# Patient Record
Sex: Female | Born: 1935 | Race: White | Hispanic: No | Smoking: Former smoker
Health system: Southern US, Community
[De-identification: ages and names within clinical notes are randomized; demographics above are authoritative.]

## PROBLEM LIST (undated history)

## (undated) DIAGNOSIS — R32 Unspecified urinary incontinence: Secondary | ICD-10-CM

## (undated) DIAGNOSIS — M810 Age-related osteoporosis without current pathological fracture: Secondary | ICD-10-CM

## (undated) DIAGNOSIS — Z9289 Personal history of other medical treatment: Secondary | ICD-10-CM

## (undated) DIAGNOSIS — K219 Gastro-esophageal reflux disease without esophagitis: Secondary | ICD-10-CM

## (undated) DIAGNOSIS — I471 Supraventricular tachycardia, unspecified: Secondary | ICD-10-CM

## (undated) DIAGNOSIS — J45909 Unspecified asthma, uncomplicated: Secondary | ICD-10-CM

## (undated) DIAGNOSIS — H9193 Unspecified hearing loss, bilateral: Secondary | ICD-10-CM

## (undated) DIAGNOSIS — R55 Syncope and collapse: Secondary | ICD-10-CM

## (undated) DIAGNOSIS — M1990 Unspecified osteoarthritis, unspecified site: Secondary | ICD-10-CM

## (undated) DIAGNOSIS — J449 Chronic obstructive pulmonary disease, unspecified: Secondary | ICD-10-CM

## (undated) DIAGNOSIS — G4730 Sleep apnea, unspecified: Secondary | ICD-10-CM

## (undated) DIAGNOSIS — F419 Anxiety disorder, unspecified: Secondary | ICD-10-CM

## (undated) DIAGNOSIS — R002 Palpitations: Secondary | ICD-10-CM

## (undated) DIAGNOSIS — E785 Hyperlipidemia, unspecified: Secondary | ICD-10-CM

## (undated) DIAGNOSIS — I5032 Chronic diastolic (congestive) heart failure: Secondary | ICD-10-CM

## (undated) DIAGNOSIS — I509 Heart failure, unspecified: Secondary | ICD-10-CM

## (undated) DIAGNOSIS — Z1211 Encounter for screening for malignant neoplasm of colon: Secondary | ICD-10-CM

## (undated) DIAGNOSIS — E119 Type 2 diabetes mellitus without complications: Secondary | ICD-10-CM

## (undated) DIAGNOSIS — I480 Paroxysmal atrial fibrillation: Secondary | ICD-10-CM

## (undated) DIAGNOSIS — Z86010 Personal history of colonic polyps: Secondary | ICD-10-CM

## (undated) DIAGNOSIS — N183 Chronic kidney disease, stage 3 unspecified: Secondary | ICD-10-CM

## (undated) HISTORY — DX: Gastro-esophageal reflux disease without esophagitis: K21.9

## (undated) HISTORY — DX: Sleep apnea, unspecified: G47.30

## (undated) HISTORY — DX: Chronic kidney disease, stage 3 unspecified: N18.30

## (undated) HISTORY — PX: COLECTOMY: SHX59

## (undated) HISTORY — DX: Hyperlipidemia, unspecified: E78.5

## (undated) HISTORY — DX: Syncope and collapse: R55

## (undated) HISTORY — DX: Unspecified urinary incontinence: R32

## (undated) HISTORY — DX: Personal history of other medical treatment: Z92.89

## (undated) HISTORY — DX: Palpitations: R00.2

## (undated) HISTORY — DX: Supraventricular tachycardia: I47.1

## (undated) HISTORY — DX: Unspecified asthma, uncomplicated: J45.909

## (undated) HISTORY — DX: Anxiety disorder, unspecified: F41.9

## (undated) HISTORY — DX: Chronic diastolic (congestive) heart failure: I50.32

## (undated) HISTORY — DX: Supraventricular tachycardia, unspecified: I47.10

## (undated) HISTORY — DX: Unspecified hearing loss, bilateral: H91.93

## (undated) HISTORY — DX: Personal history of colonic polyps: Z86.010

## (undated) HISTORY — DX: Chronic obstructive pulmonary disease, unspecified: J44.9

## (undated) HISTORY — DX: Paroxysmal atrial fibrillation: I48.0

## (undated) HISTORY — PX: TONSILLECTOMY: SUR1361

## (undated) HISTORY — DX: Type 2 diabetes mellitus without complications: E11.9

## (undated) HISTORY — DX: Age-related osteoporosis without current pathological fracture: M81.0

## (undated) HISTORY — DX: Encounter for screening for malignant neoplasm of colon: Z12.11

## (undated) HISTORY — DX: Heart failure, unspecified: I50.9

---

## 1977-01-21 HISTORY — PX: TOTAL ABDOMINAL HYSTERECTOMY: SHX209

## 1982-01-21 HISTORY — PX: CHOLECYSTECTOMY: SHX55

## 1996-01-22 HISTORY — PX: REPLACEMENT TOTAL KNEE BILATERAL: SUR1225

## 2003-08-11 ENCOUNTER — Other Ambulatory Visit: Payer: Self-pay

## 2003-08-12 ENCOUNTER — Other Ambulatory Visit: Payer: Self-pay

## 2004-01-21 ENCOUNTER — Other Ambulatory Visit: Payer: Self-pay

## 2004-01-22 ENCOUNTER — Inpatient Hospital Stay: Payer: Self-pay | Admitting: Family Medicine

## 2004-06-27 ENCOUNTER — Ambulatory Visit: Payer: Self-pay | Admitting: Pain Medicine

## 2004-07-09 ENCOUNTER — Ambulatory Visit: Payer: Self-pay | Admitting: Pain Medicine

## 2004-07-17 ENCOUNTER — Ambulatory Visit: Payer: Self-pay

## 2004-07-30 ENCOUNTER — Ambulatory Visit: Payer: Self-pay | Admitting: Pain Medicine

## 2004-08-02 ENCOUNTER — Ambulatory Visit: Payer: Self-pay | Admitting: Internal Medicine

## 2004-08-09 ENCOUNTER — Ambulatory Visit: Payer: Self-pay | Admitting: Pain Medicine

## 2004-08-21 ENCOUNTER — Ambulatory Visit: Payer: Self-pay | Admitting: Internal Medicine

## 2004-08-29 ENCOUNTER — Ambulatory Visit: Payer: Self-pay | Admitting: Physician Assistant

## 2004-09-17 ENCOUNTER — Ambulatory Visit: Payer: Self-pay | Admitting: Pain Medicine

## 2004-09-18 ENCOUNTER — Ambulatory Visit: Payer: Self-pay | Admitting: Pain Medicine

## 2004-09-21 ENCOUNTER — Ambulatory Visit: Payer: Self-pay | Admitting: Internal Medicine

## 2004-10-02 ENCOUNTER — Ambulatory Visit: Payer: Self-pay | Admitting: Pain Medicine

## 2004-10-16 ENCOUNTER — Ambulatory Visit: Payer: Self-pay | Admitting: Physician Assistant

## 2004-10-21 ENCOUNTER — Ambulatory Visit: Payer: Self-pay | Admitting: Internal Medicine

## 2004-11-05 ENCOUNTER — Ambulatory Visit: Payer: Self-pay | Admitting: Family Medicine

## 2004-12-12 ENCOUNTER — Emergency Department: Payer: Self-pay | Admitting: Emergency Medicine

## 2004-12-12 ENCOUNTER — Other Ambulatory Visit: Payer: Self-pay

## 2005-01-24 ENCOUNTER — Emergency Department: Payer: Self-pay | Admitting: Emergency Medicine

## 2005-09-19 ENCOUNTER — Emergency Department: Payer: Self-pay | Admitting: Emergency Medicine

## 2005-11-07 ENCOUNTER — Ambulatory Visit: Payer: Self-pay | Admitting: Family Medicine

## 2006-01-01 ENCOUNTER — Ambulatory Visit: Payer: Self-pay | Admitting: General Practice

## 2006-01-20 ENCOUNTER — Ambulatory Visit: Payer: Self-pay | Admitting: Psychiatry

## 2006-01-27 ENCOUNTER — Emergency Department: Payer: Self-pay | Admitting: Emergency Medicine

## 2006-05-30 ENCOUNTER — Ambulatory Visit: Payer: Self-pay | Admitting: General Practice

## 2006-05-30 ENCOUNTER — Other Ambulatory Visit: Payer: Self-pay

## 2006-06-13 ENCOUNTER — Ambulatory Visit: Payer: Self-pay | Admitting: General Practice

## 2006-12-24 ENCOUNTER — Ambulatory Visit: Payer: Self-pay | Admitting: Family Medicine

## 2007-01-22 HISTORY — PX: SHOULDER SURGERY: SHX246

## 2007-04-07 ENCOUNTER — Ambulatory Visit: Payer: Self-pay | Admitting: Gastroenterology

## 2007-05-08 ENCOUNTER — Ambulatory Visit: Payer: Self-pay | Admitting: Surgery

## 2007-05-13 ENCOUNTER — Inpatient Hospital Stay: Payer: Self-pay | Admitting: Surgery

## 2007-05-14 ENCOUNTER — Other Ambulatory Visit: Payer: Self-pay

## 2008-01-22 HISTORY — PX: GASTRIC BYPASS: SHX52

## 2008-01-25 ENCOUNTER — Ambulatory Visit: Payer: Self-pay | Admitting: Family Medicine

## 2008-01-28 ENCOUNTER — Ambulatory Visit: Payer: Self-pay | Admitting: Family Medicine

## 2008-09-01 ENCOUNTER — Ambulatory Visit: Payer: Self-pay | Admitting: Surgery

## 2008-11-30 IMAGING — CR DG CHEST 1V PORT
1 series · 1 of 1 positions shown · non-contrast
Comparison: none

REASON FOR EXAM: central line placement
COMMENTS:

[view not recorded]
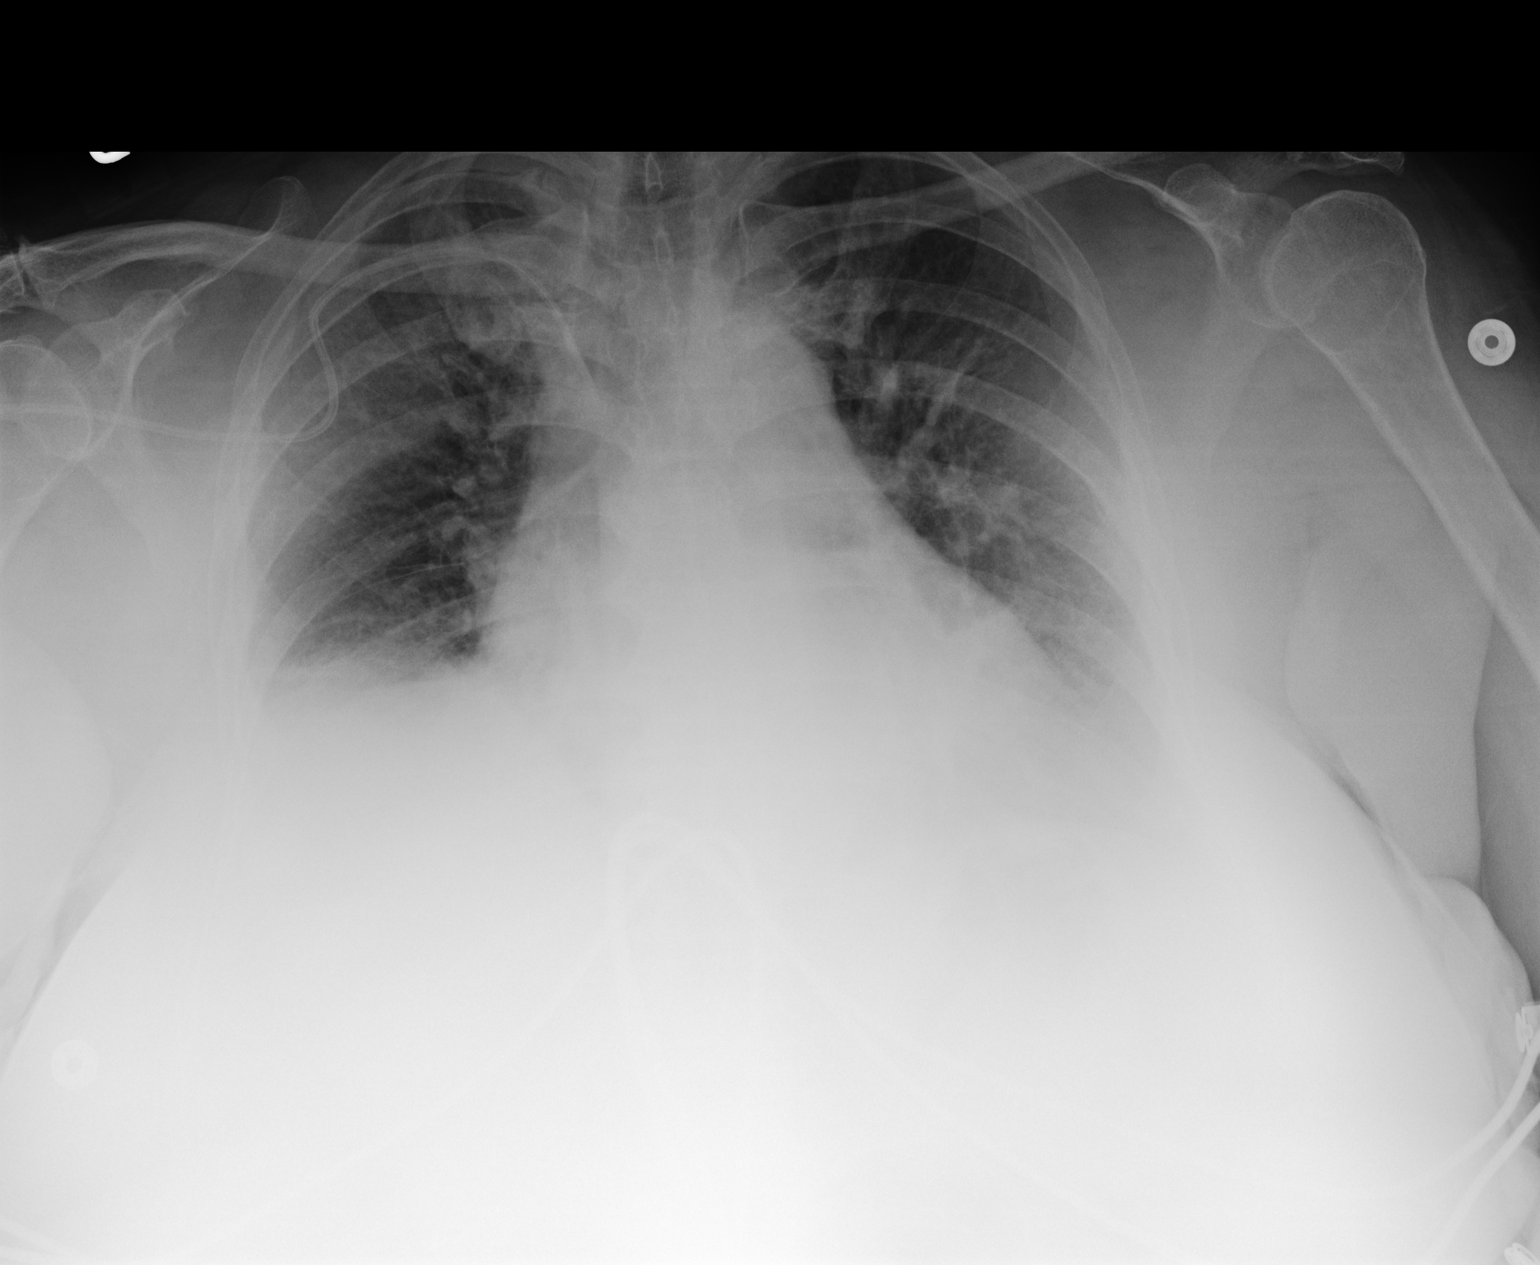

[1 of 1 positions shown; findings below may reference images not displayed]

PROCEDURE:     DXR - DXR PORTABLE CHEST SINGLE VIEW  - May 13, 2007  [DATE]

RESULT:     Portable AP view of the chest was performed in expiration.

A central line is present with the tip projected over the superior vena cava
at the level of the aortic knob. No pneumothorax or pleural effusion is seen.
IMPRESSION: Please see above.

## 2009-03-16 ENCOUNTER — Ambulatory Visit: Payer: Self-pay | Admitting: Surgery

## 2009-09-18 ENCOUNTER — Ambulatory Visit: Payer: Self-pay | Admitting: Surgery

## 2010-01-21 HISTORY — PX: COSMETIC SURGERY: SHX468

## 2010-05-16 ENCOUNTER — Ambulatory Visit: Payer: Self-pay | Admitting: Surgery

## 2010-07-13 ENCOUNTER — Ambulatory Visit: Payer: Self-pay | Admitting: Family Medicine

## 2010-08-31 ENCOUNTER — Ambulatory Visit: Payer: Self-pay | Admitting: Gastroenterology

## 2010-09-04 LAB — PATHOLOGY REPORT

## 2010-09-06 ENCOUNTER — Ambulatory Visit: Payer: Self-pay | Admitting: Gastroenterology

## 2011-05-22 ENCOUNTER — Ambulatory Visit: Payer: Self-pay | Admitting: Surgery

## 2012-03-29 ENCOUNTER — Emergency Department: Payer: Self-pay | Admitting: Emergency Medicine

## 2012-03-29 LAB — CBC
HCT: 35.5 % (ref 35.0–47.0)
HGB: 11.8 g/dL — ABNORMAL LOW (ref 12.0–16.0)
MCH: 32.9 pg (ref 26.0–34.0)
MCHC: 33.2 g/dL (ref 32.0–36.0)
MCV: 99 fL (ref 80–100)
Platelet: 213 10*3/uL (ref 150–440)
RBC: 3.59 10*6/uL — ABNORMAL LOW (ref 3.80–5.20)
RDW: 12.8 % (ref 11.5–14.5)
WBC: 12.9 10*3/uL — ABNORMAL HIGH (ref 3.6–11.0)

## 2012-03-29 LAB — COMPREHENSIVE METABOLIC PANEL
Albumin: 3.6 g/dL (ref 3.4–5.0)
Alkaline Phosphatase: 113 U/L (ref 50–136)
Anion Gap: 9 (ref 7–16)
BUN: 25 mg/dL — ABNORMAL HIGH (ref 7–18)
Bilirubin,Total: 0.2 mg/dL (ref 0.2–1.0)
Calcium, Total: 8.4 mg/dL — ABNORMAL LOW (ref 8.5–10.1)
Chloride: 108 mmol/L — ABNORMAL HIGH (ref 98–107)
Co2: 21 mmol/L (ref 21–32)
Creatinine: 1.26 mg/dL (ref 0.60–1.30)
EGFR (African American): 49 — ABNORMAL LOW
EGFR (Non-African Amer.): 42 — ABNORMAL LOW
Glucose: 105 mg/dL — ABNORMAL HIGH (ref 65–99)
Osmolality: 280 (ref 275–301)
Potassium: 4.3 mmol/L (ref 3.5–5.1)
SGOT(AST): 30 U/L (ref 15–37)
SGPT (ALT): 34 U/L (ref 12–78)
Sodium: 138 mmol/L (ref 136–145)
Total Protein: 6.8 g/dL (ref 6.4–8.2)

## 2012-03-29 LAB — CK TOTAL AND CKMB (NOT AT ARMC)
CK, Total: 42 U/L (ref 21–215)
CK-MB: <0.5 ng/mL — ABNORMAL LOW (ref 0.5–3.6)

## 2012-03-29 LAB — TROPONIN I: Troponin-I: <0.02 ng/mL

## 2013-03-31 ENCOUNTER — Ambulatory Visit: Payer: Self-pay | Admitting: Family Medicine

## 2013-10-15 DIAGNOSIS — Z860101 Personal history of adenomatous and serrated colon polyps: Secondary | ICD-10-CM | POA: Insufficient documentation

## 2013-10-15 DIAGNOSIS — Z86010 Personal history of colonic polyps: Secondary | ICD-10-CM | POA: Insufficient documentation

## 2013-11-01 ENCOUNTER — Ambulatory Visit: Payer: Self-pay | Admitting: Gastroenterology

## 2013-11-03 LAB — PATHOLOGY REPORT

## 2013-11-12 ENCOUNTER — Ambulatory Visit (INDEPENDENT_AMBULATORY_CARE_PROVIDER_SITE_OTHER): Payer: Medicare Other | Admitting: Cardiovascular Disease

## 2013-11-12 ENCOUNTER — Encounter: Payer: Self-pay | Admitting: Cardiovascular Disease

## 2013-11-12 ENCOUNTER — Other Ambulatory Visit: Payer: Self-pay

## 2013-11-12 ENCOUNTER — Other Ambulatory Visit (INDEPENDENT_AMBULATORY_CARE_PROVIDER_SITE_OTHER): Payer: Medicare Other

## 2013-11-12 ENCOUNTER — Encounter (INDEPENDENT_AMBULATORY_CARE_PROVIDER_SITE_OTHER): Payer: Self-pay

## 2013-11-12 VITALS — BP 110/72 | HR 62 | Ht 63.0 in | Wt 212.8 lb

## 2013-11-12 DIAGNOSIS — I313 Pericardial effusion (noninflammatory): Secondary | ICD-10-CM

## 2013-11-12 DIAGNOSIS — I319 Disease of pericardium, unspecified: Secondary | ICD-10-CM

## 2013-11-12 DIAGNOSIS — K219 Gastro-esophageal reflux disease without esophagitis: Secondary | ICD-10-CM

## 2013-11-12 DIAGNOSIS — I3139 Other pericardial effusion (noninflammatory): Secondary | ICD-10-CM

## 2013-11-12 DIAGNOSIS — I4901 Ventricular fibrillation: Secondary | ICD-10-CM

## 2013-11-12 DIAGNOSIS — R0602 Shortness of breath: Secondary | ICD-10-CM

## 2013-11-12 DIAGNOSIS — R05 Cough: Secondary | ICD-10-CM

## 2013-11-12 DIAGNOSIS — I498 Other specified cardiac arrhythmias: Secondary | ICD-10-CM

## 2013-11-12 DIAGNOSIS — R053 Chronic cough: Secondary | ICD-10-CM

## 2013-11-12 DIAGNOSIS — E785 Hyperlipidemia, unspecified: Secondary | ICD-10-CM

## 2013-11-12 DIAGNOSIS — R600 Localized edema: Secondary | ICD-10-CM

## 2013-11-12 MED ORDER — OMEPRAZOLE 40 MG PO CPDR: 40.0000 mg | DELAYED_RELEASE_CAPSULE | Freq: Every day | ORAL | Status: DC

## 2013-11-12 NOTE — Patient Instructions (Addendum)
Your next appointment will be scheduled in our new office located at :  [place]  [address] [place], [place] [address]   You are doing well. No medication changes were made.  We will schedule an echocardiogram for fluid around the heart, pericardial fluid, and leg swelling/edema  Please start omeprazole one pill twice a day for GERD/heartburn If you still have cough in two weeks, call the office. We would hold the lisinopril (change to losartan)  Please call us if you have new issues that need to be addressed before your next appt.

## 2013-11-12 NOTE — Progress Notes (Signed)
Patient ID: Jenna [name], female    DOB: 24-Jul-1935, 78 y.o.   MRN: [id number]  HPI Comments: Jenna [name] is a very pleasant 78 year old woman with history of obesity, hypertension, hyperlipidemia, GERD, chronic cough, gastric bypass surgery, depression,  chronic leg edema presents for evaluation of pericardial effusion seen on CT scan.  She reports that she had a recent CT scan of her chest 11/01/2013 for a chronic cough. She reports cough have been ongoing for 4-5 months as well as shortness of breath. She does have a remote smoking history CT scan showed fluid collection posterior to the pulmonary veins measuring 3.1 cm felt to be pericardial effusion though pericardial cysts could not be excluded. No mention of underlying coronary arterial disease  She reports that she has shortness of breath with exertion, has had a chronic cough. Her other big complaint didn't GERD symptoms. Several other family members have GERD as well. She has had chronic lower extremity edema, prior trauma to her left lower extremity. She takes Lasix 20 mg daily with no significant improvement  EKG shows normal sinus rhythm with no significant ST or T wave changes    Outpatient Encounter Prescriptions as of 11/12/2013  Medication Sig  . Ascorbic Acid (VITAMIN C) 1000 MG tablet Take 500 mg by mouth daily.   . Cholecalciferol (VITAMIN D-1000 MAX ST) 1000 UNITS tablet Take 1,000 Units by mouth daily.   . citalopram (CELEXA) 40 MG tablet Take 40 mg by mouth daily.   . Cyanocobalamin (RA VITAMIN B-12 TR) 1000 MCG TBCR Take 1,000 mcg by mouth daily.   . ferrous sulfate 325 (65 FE) MG EC tablet Take by mouth.  . furosemide (LASIX) 20 MG tablet TAKE ONE TABLET BY MOUTH ONCE DAILY  . lisinopril (PRINIVIL,ZESTRIL) 40 MG tablet Take 1/2 tablet once a day  . Omega-3 Fatty Acids (FISH OIL) 1000 MG CAPS Take 1,000 mg by mouth daily.  Marland Kitchen omeprazole (PRILOSEC) 40 MG capsule Take 1 capsule (40 mg total) by mouth daily.  .  Pediatric Multiple Vit-C-FA (CHEWABLE VITE CHILDRENS) CHEW Chew by mouth daily.   . ranitidine (ZANTAC) 150 MG tablet Take 150 mg by mouth 2 (two) times daily.   . simvastatin (ZOCOR) 40 MG tablet Take 40 mg by mouth daily at 6 PM.     Review of Systems  Constitutional: Negative.   HENT: Negative.   Eyes: Negative.   Respiratory: Negative.   Cardiovascular: Negative.   Gastrointestinal: Negative.   Endocrine: Negative.   Musculoskeletal: Negative.   Skin: Negative.   Allergic/Immunologic: Negative.   Neurological: Negative.   Hematological: Negative.   Psychiatric/Behavioral: Negative.   All other systems reviewed and are negative.   BP 110/72  Pulse 62  Ht 5\' 3"  (1.6 m)  Wt 212 lb 12 oz (96.503 kg)  BMI 37.70 kg/m2  Physical Exam  Nursing note and vitals reviewed. Constitutional: She is oriented to person, place, and time. She appears well-developed and well-nourished.  HENT:  Head: Normocephalic.  Nose: Nose normal.  Mouth/Throat: Oropharynx is clear and moist.  Eyes: Conjunctivae are normal. Pupils are equal, round, and reactive to light.  Neck: Normal range of motion. Neck supple. No JVD present.  Cardiovascular: Normal rate, regular rhythm, S1 normal, S2 normal, normal heart sounds and intact distal pulses.  Exam reveals no gallop and no friction rub.   No murmur heard. Pulmonary/Chest: Effort normal and breath sounds normal. No respiratory distress. She has no wheezes. She has no rales. She exhibits no  tenderness.  Abdominal: Soft. Bowel sounds are normal. She exhibits no distension. There is no tenderness.  Musculoskeletal: Normal range of motion. She exhibits no edema and no tenderness.  Lymphadenopathy:    She has no cervical adenopathy.  Neurological: She is alert and oriented to person, place, and time. Coordination normal.  Skin: Skin is warm and dry. No rash noted. No erythema.  Psychiatric: She has a normal mood and affect. Her behavior is normal. Judgment  and thought content normal.    Assessment and Plan

## 2013-11-13 DIAGNOSIS — E669 Obesity, unspecified: Secondary | ICD-10-CM | POA: Insufficient documentation

## 2013-11-13 DIAGNOSIS — K219 Gastro-esophageal reflux disease without esophagitis: Secondary | ICD-10-CM | POA: Insufficient documentation

## 2013-11-13 DIAGNOSIS — R0602 Shortness of breath: Secondary | ICD-10-CM | POA: Insufficient documentation

## 2013-11-13 DIAGNOSIS — R600 Localized edema: Secondary | ICD-10-CM | POA: Insufficient documentation

## 2013-11-13 DIAGNOSIS — I3139 Other pericardial effusion (noninflammatory): Secondary | ICD-10-CM | POA: Insufficient documentation

## 2013-11-13 DIAGNOSIS — R05 Cough: Secondary | ICD-10-CM | POA: Insufficient documentation

## 2013-11-13 DIAGNOSIS — R053 Chronic cough: Secondary | ICD-10-CM | POA: Insufficient documentation

## 2013-11-13 DIAGNOSIS — I313 Pericardial effusion (noninflammatory): Secondary | ICD-10-CM | POA: Insufficient documentation

## 2013-11-13 NOTE — Assessment & Plan Note (Signed)
Suggested she start omeprazole twice a day for several weeks. If no improvement in her cough, would discontinue the lisinopril and changed to losartan

## 2013-11-13 NOTE — Assessment & Plan Note (Signed)
Chronic shortness of breath with exertion likely from deconditioning and obesity. Echo pending to evaluate right heart pressures and ejection fraction.

## 2013-11-13 NOTE — Assessment & Plan Note (Signed)
Encourage her to stay on simvastatin.

## 2013-11-13 NOTE — Assessment & Plan Note (Signed)
We have encouraged continued exercise, careful diet management in an effort to lose weight. 

## 2013-11-13 NOTE — Assessment & Plan Note (Signed)
She has chronic cough and GERD symptoms. Suggested she start omeprazole twice a day. Prescription provided today

## 2013-11-13 NOTE — Assessment & Plan Note (Signed)
Significant bilateral lower extremity swelling, likely noncardiac in nature. Stressed the importance of leg elevation, compression hose. If needed could use Ace wraps. Likely has component of lymphedema, chronic venous insufficiency.

## 2013-11-13 NOTE — Assessment & Plan Note (Signed)
CT scan images reviewed with her in clinic today. Report was read. Echocardiogram has been ordered to exclude major pathology. Suspect small pericardial effusion

## 2013-11-15 ENCOUNTER — Telehealth: Payer: Self-pay

## 2013-11-15 NOTE — Telephone Encounter (Signed)
called to give echo results.lmtcb

## 2013-11-15 NOTE — Telephone Encounter (Signed)
Message copied by Lamar Laundry on Mon Nov 15, 2013  8:47 AM ------      Message from: Minna Merritts      Created: Sun Nov 14, 2013 11:43 AM       Echocardiogram report:      Essentially a normal study,      Normal function, normal valves, normal pressures      Leg edema is not from fluid overload. ------

## 2013-11-15 NOTE — Telephone Encounter (Signed)
Message copied by Lamar Laundry on Mon Nov 15, 2013  9:17 AM ------      Message from: Minna Merritts      Created: Sun Nov 14, 2013 11:43 AM       Echocardiogram report:      Essentially a normal study,      Normal function, normal valves, normal pressures      Leg edema is not from fluid overload. ------

## 2013-11-15 NOTE — Telephone Encounter (Signed)
Pt aware of echo results. Echocardiogram report: Essentially a normal study, Normal function, normal valves, normal pressures Leg edema is not from fluid overload. Pt verbalized understanding

## 2013-12-31 ENCOUNTER — Emergency Department: Payer: Self-pay | Admitting: Emergency Medicine

## 2014-04-12 ENCOUNTER — Ambulatory Visit: Payer: Self-pay | Admitting: Family Medicine

## 2014-06-12 ENCOUNTER — Inpatient Hospital Stay
Admission: EM | Admit: 2014-06-12 | Discharge: 2014-06-14 | DRG: 190 | Disposition: A | Discharge status: Home / Self Care | Payer: Medicare Other | Attending: Internal Medicine | Admitting: Internal Medicine

## 2014-06-12 DIAGNOSIS — H9193 Unspecified hearing loss, bilateral: Secondary | ICD-10-CM | POA: Diagnosis present

## 2014-06-12 DIAGNOSIS — I10 Essential (primary) hypertension: Secondary | ICD-10-CM | POA: Diagnosis present

## 2014-06-12 DIAGNOSIS — Z79899 Other long term (current) drug therapy: Secondary | ICD-10-CM

## 2014-06-12 DIAGNOSIS — J45909 Unspecified asthma, uncomplicated: Secondary | ICD-10-CM | POA: Diagnosis present

## 2014-06-12 DIAGNOSIS — J441 Chronic obstructive pulmonary disease with (acute) exacerbation: Principal | ICD-10-CM | POA: Diagnosis present

## 2014-06-12 DIAGNOSIS — Z885 Allergy status to narcotic agent status: Secondary | ICD-10-CM

## 2014-06-12 DIAGNOSIS — K219 Gastro-esophageal reflux disease without esophagitis: Secondary | ICD-10-CM | POA: Diagnosis present

## 2014-06-12 DIAGNOSIS — E119 Type 2 diabetes mellitus without complications: Secondary | ICD-10-CM | POA: Diagnosis present

## 2014-06-12 DIAGNOSIS — Y92239 Unspecified place in hospital as the place of occurrence of the external cause: Secondary | ICD-10-CM

## 2014-06-12 DIAGNOSIS — Z9884 Bariatric surgery status: Secondary | ICD-10-CM

## 2014-06-12 DIAGNOSIS — G4730 Sleep apnea, unspecified: Secondary | ICD-10-CM | POA: Diagnosis present

## 2014-06-12 DIAGNOSIS — Z888 Allergy status to other drugs, medicaments and biological substances status: Secondary | ICD-10-CM

## 2014-06-12 DIAGNOSIS — Z87891 Personal history of nicotine dependence: Secondary | ICD-10-CM

## 2014-06-12 DIAGNOSIS — R0789 Other chest pain: Secondary | ICD-10-CM | POA: Diagnosis present

## 2014-06-12 DIAGNOSIS — Z96653 Presence of artificial knee joint, bilateral: Secondary | ICD-10-CM | POA: Diagnosis present

## 2014-06-12 DIAGNOSIS — R110 Nausea: Secondary | ICD-10-CM | POA: Diagnosis not present

## 2014-06-12 DIAGNOSIS — E876 Hypokalemia: Secondary | ICD-10-CM | POA: Diagnosis not present

## 2014-06-12 DIAGNOSIS — Z6836 Body mass index (BMI) 36.0-36.9, adult: Secondary | ICD-10-CM

## 2014-06-12 DIAGNOSIS — T363X5A Adverse effect of macrolides, initial encounter: Secondary | ICD-10-CM | POA: Diagnosis not present

## 2014-06-12 DIAGNOSIS — E785 Hyperlipidemia, unspecified: Secondary | ICD-10-CM | POA: Diagnosis present

## 2014-06-12 DIAGNOSIS — J9621 Acute and chronic respiratory failure with hypoxia: Secondary | ICD-10-CM | POA: Diagnosis present

## 2014-06-12 MED ORDER — ALBUTEROL SULFATE (2.5 MG/3ML) 0.083% IN NEBU
INHALATION_SOLUTION | RESPIRATORY_TRACT | Status: AC
  Filled 2014-06-12: qty 6

## 2014-06-12 MED ORDER — ALBUTEROL SULFATE (2.5 MG/3ML) 0.083% IN NEBU: 2.5000 mg | INHALATION_SOLUTION | Freq: Once | RESPIRATORY_TRACT | Status: DC

## 2014-06-12 MED ORDER — ALBUTEROL SULFATE (2.5 MG/3ML) 0.083% IN NEBU
2.5000 mg | INHALATION_SOLUTION | Freq: Once | RESPIRATORY_TRACT | Status: AC
  Administered 2014-06-13: 5 mg via RESPIRATORY_TRACT

## 2014-06-12 NOTE — ED Notes (Signed)
Pt presents to ER alert and in severe distress. Pt has audible wheezing, brought straight back to room.

## 2014-06-13 ENCOUNTER — Emergency Department: Payer: Medicare Other

## 2014-06-13 ENCOUNTER — Encounter: Payer: Self-pay | Admitting: *Deleted

## 2014-06-13 DIAGNOSIS — Z9884 Bariatric surgery status: Secondary | ICD-10-CM | POA: Diagnosis not present

## 2014-06-13 DIAGNOSIS — R110 Nausea: Secondary | ICD-10-CM | POA: Diagnosis not present

## 2014-06-13 DIAGNOSIS — J441 Chronic obstructive pulmonary disease with (acute) exacerbation: Secondary | ICD-10-CM | POA: Diagnosis present

## 2014-06-13 DIAGNOSIS — J45909 Unspecified asthma, uncomplicated: Secondary | ICD-10-CM | POA: Diagnosis present

## 2014-06-13 DIAGNOSIS — Z885 Allergy status to narcotic agent status: Secondary | ICD-10-CM | POA: Diagnosis not present

## 2014-06-13 DIAGNOSIS — Z6836 Body mass index (BMI) 36.0-36.9, adult: Secondary | ICD-10-CM | POA: Diagnosis not present

## 2014-06-13 DIAGNOSIS — Z888 Allergy status to other drugs, medicaments and biological substances status: Secondary | ICD-10-CM | POA: Diagnosis not present

## 2014-06-13 DIAGNOSIS — K219 Gastro-esophageal reflux disease without esophagitis: Secondary | ICD-10-CM | POA: Diagnosis present

## 2014-06-13 DIAGNOSIS — R0789 Other chest pain: Secondary | ICD-10-CM | POA: Diagnosis present

## 2014-06-13 DIAGNOSIS — J9621 Acute and chronic respiratory failure with hypoxia: Secondary | ICD-10-CM | POA: Diagnosis present

## 2014-06-13 DIAGNOSIS — E876 Hypokalemia: Secondary | ICD-10-CM | POA: Diagnosis not present

## 2014-06-13 DIAGNOSIS — I10 Essential (primary) hypertension: Secondary | ICD-10-CM | POA: Diagnosis present

## 2014-06-13 DIAGNOSIS — Z79899 Other long term (current) drug therapy: Secondary | ICD-10-CM | POA: Diagnosis not present

## 2014-06-13 DIAGNOSIS — Z96653 Presence of artificial knee joint, bilateral: Secondary | ICD-10-CM | POA: Diagnosis present

## 2014-06-13 DIAGNOSIS — H9193 Unspecified hearing loss, bilateral: Secondary | ICD-10-CM | POA: Diagnosis present

## 2014-06-13 DIAGNOSIS — E119 Type 2 diabetes mellitus without complications: Secondary | ICD-10-CM | POA: Diagnosis present

## 2014-06-13 DIAGNOSIS — G4730 Sleep apnea, unspecified: Secondary | ICD-10-CM | POA: Diagnosis present

## 2014-06-13 DIAGNOSIS — Y92239 Unspecified place in hospital as the place of occurrence of the external cause: Secondary | ICD-10-CM | POA: Diagnosis not present

## 2014-06-13 DIAGNOSIS — E785 Hyperlipidemia, unspecified: Secondary | ICD-10-CM | POA: Diagnosis present

## 2014-06-13 DIAGNOSIS — T363X5A Adverse effect of macrolides, initial encounter: Secondary | ICD-10-CM | POA: Diagnosis not present

## 2014-06-13 DIAGNOSIS — Z87891 Personal history of nicotine dependence: Secondary | ICD-10-CM | POA: Diagnosis not present

## 2014-06-13 LAB — CBC WITH DIFFERENTIAL/PLATELET
Basophils Absolute: 0.1 10*3/uL (ref 0–0.1)
Basophils Relative: 1 %
Eosinophils Absolute: 0.1 10*3/uL (ref 0–0.7)
Eosinophils Relative: 1 %
HCT: 36 % (ref 35.0–47.0)
Hemoglobin: 12 g/dL (ref 12.0–16.0)
Lymphocytes Relative: 17 %
Lymphs Abs: 1.6 10*3/uL (ref 1.0–3.6)
MCH: 32.8 pg (ref 26.0–34.0)
MCHC: 33.4 g/dL (ref 32.0–36.0)
MCV: 98.3 fL (ref 80.0–100.0)
Monocytes Absolute: 0.5 10*3/uL (ref 0.2–0.9)
Monocytes Relative: 5 %
Neutro Abs: 7.2 10*3/uL — ABNORMAL HIGH (ref 1.4–6.5)
Neutrophils Relative %: 76 %
Platelets: 174 10*3/uL (ref 150–440)
RBC: 3.67 MIL/uL — ABNORMAL LOW (ref 3.80–5.20)
RDW: 13.1 % (ref 11.5–14.5)
WBC: 9.5 10*3/uL (ref 3.6–11.0)

## 2014-06-13 LAB — BASIC METABOLIC PANEL
Anion gap: 7 (ref 5–15)
BUN: 16 mg/dL (ref 6–20)
CO2: 25 mmol/L (ref 22–32)
Calcium: 8.6 mg/dL — ABNORMAL LOW (ref 8.9–10.3)
Chloride: 107 mmol/L (ref 101–111)
Creatinine, Ser: 0.97 mg/dL (ref 0.44–1.00)
GFR calc Af Amer: >60 mL/min (ref >60)
GFR calc non Af Amer: 55 mL/min — ABNORMAL LOW (ref >60)
Glucose, Bld: 146 mg/dL — ABNORMAL HIGH (ref 65–99)
Potassium: 3.4 mmol/L — ABNORMAL LOW (ref 3.5–5.1)
Sodium: 139 mmol/L (ref 135–145)

## 2014-06-13 LAB — BRAIN NATRIURETIC PEPTIDE: B Natriuretic Peptide: 45 pg/mL (ref 0.0–100.0)

## 2014-06-13 LAB — TROPONIN I: Troponin I: <0.03 ng/mL (ref <0.031)

## 2014-06-13 MED ORDER — LISINOPRIL 5 MG PO TABS
5.0000 mg | ORAL_TABLET | Freq: Every day | ORAL | Status: DC
  Administered 2014-06-13 – 2014-06-14 (×2): 5 mg via ORAL
  Filled 2014-06-13 (×2): qty 1

## 2014-06-13 MED ORDER — ZOLPIDEM TARTRATE 5 MG PO TABS
5.0000 mg | ORAL_TABLET | Freq: Every evening | ORAL | Status: DC | PRN
  Administered 2014-06-13: 5 mg via ORAL
  Filled 2014-06-13: qty 1

## 2014-06-13 MED ORDER — FUROSEMIDE 20 MG PO TABS
20.0000 mg | ORAL_TABLET | Freq: Every day | ORAL | Status: DC
  Administered 2014-06-13 – 2014-06-14 (×2): 20 mg via ORAL
  Filled 2014-06-13 (×2): qty 1

## 2014-06-13 MED ORDER — CYANOCOBALAMIN ER 1000 MCG PO TBCR: 1000.0000 ug | EXTENDED_RELEASE_TABLET | Freq: Every day | ORAL | Status: DC

## 2014-06-13 MED ORDER — PREDNISONE 20 MG PO TABS: 30.0000 mg | ORAL_TABLET | Freq: Every day | ORAL | Status: DC

## 2014-06-13 MED ORDER — VITAMIN C 500 MG PO TABS
500.0000 mg | ORAL_TABLET | Freq: Every day | ORAL | Status: DC
  Administered 2014-06-13 – 2014-06-14 (×2): 500 mg via ORAL
  Filled 2014-06-13 (×2): qty 1

## 2014-06-13 MED ORDER — IPRATROPIUM-ALBUTEROL 0.5-2.5 (3) MG/3ML IN SOLN
RESPIRATORY_TRACT | Status: AC
  Administered 2014-06-13: 3 mL via RESPIRATORY_TRACT
  Filled 2014-06-13: qty 3

## 2014-06-13 MED ORDER — PREDNISONE 20 MG PO TABS: 20.0000 mg | ORAL_TABLET | Freq: Every day | ORAL | Status: DC

## 2014-06-13 MED ORDER — PREDNISONE 1 MG PO TABS: 5.0000 mg | ORAL_TABLET | Freq: Every day | ORAL | Status: DC

## 2014-06-13 MED ORDER — IPRATROPIUM-ALBUTEROL 0.5-2.5 (3) MG/3ML IN SOLN
RESPIRATORY_TRACT | Status: AC
  Filled 2014-06-13: qty 3

## 2014-06-13 MED ORDER — POTASSIUM CHLORIDE CRYS ER 20 MEQ PO TBCR
40.0000 meq | EXTENDED_RELEASE_TABLET | Freq: Once | ORAL | Status: AC
  Administered 2014-06-13: 16:00:00 40 meq via ORAL
  Filled 2014-06-13: qty 2

## 2014-06-13 MED ORDER — IPRATROPIUM-ALBUTEROL 0.5-2.5 (3) MG/3ML IN SOLN
3.0000 mL | RESPIRATORY_TRACT | Status: DC | PRN
  Administered 2014-06-14: 12:00:00 3 mL via RESPIRATORY_TRACT
  Filled 2014-06-13: qty 3

## 2014-06-13 MED ORDER — DEXTROSE 5 % IV SOLN
500.0000 mg | Freq: Once | INTRAVENOUS | Status: AC
  Administered 2014-06-13: 17:00:00 500 mg via INTRAVENOUS
  Filled 2014-06-13 (×2): qty 500

## 2014-06-13 MED ORDER — ACETAMINOPHEN 650 MG RE SUPP: 650.0000 mg | Freq: Four times a day (QID) | RECTAL | Status: DC | PRN

## 2014-06-13 MED ORDER — METHYLPREDNISOLONE SODIUM SUCC 125 MG IJ SOLR
125.0000 mg | Freq: Once | INTRAMUSCULAR | Status: AC
  Administered 2014-06-13: 125 mg via INTRAVENOUS

## 2014-06-13 MED ORDER — PREDNISONE 20 MG PO TABS: 40.0000 mg | ORAL_TABLET | Freq: Every day | ORAL | Status: DC

## 2014-06-13 MED ORDER — AZITHROMYCIN 250 MG PO TABS
250.0000 mg | ORAL_TABLET | Freq: Every day | ORAL | Status: DC
  Filled 2014-06-13: qty 1

## 2014-06-13 MED ORDER — ALBUTEROL SULFATE (2.5 MG/3ML) 0.083% IN NEBU
2.5000 mg | INHALATION_SOLUTION | Freq: Once | RESPIRATORY_TRACT | Status: AC
  Administered 2014-06-13: 2.5 mg via RESPIRATORY_TRACT

## 2014-06-13 MED ORDER — LORAZEPAM 1 MG PO TABS: 1.0000 mg | ORAL_TABLET | Freq: Two times a day (BID) | ORAL | Status: DC | PRN

## 2014-06-13 MED ORDER — SODIUM CHLORIDE 0.9 % IJ SOLN
3.0000 mL | Freq: Two times a day (BID) | INTRAMUSCULAR | Status: DC
  Administered 2014-06-13 – 2014-06-14 (×3): 3 mL via INTRAVENOUS

## 2014-06-13 MED ORDER — MOMETASONE FURO-FORMOTEROL FUM 100-5 MCG/ACT IN AERO
2.0000 | INHALATION_SPRAY | Freq: Two times a day (BID) | RESPIRATORY_TRACT | Status: DC
  Administered 2014-06-13 – 2014-06-14 (×3): 2 via RESPIRATORY_TRACT
  Filled 2014-06-13: qty 8.8

## 2014-06-13 MED ORDER — ACETAMINOPHEN 325 MG PO TABS: 650.0000 mg | ORAL_TABLET | Freq: Four times a day (QID) | ORAL | Status: DC | PRN

## 2014-06-13 MED ORDER — VITAMIN B-12 1000 MCG PO TABS
1000.0000 ug | ORAL_TABLET | Freq: Every day | ORAL | Status: DC
  Administered 2014-06-13 – 2014-06-14 (×2): 1000 ug via ORAL
  Filled 2014-06-13 (×2): qty 1

## 2014-06-13 MED ORDER — FAMOTIDINE 20 MG PO TABS: 20.0000 mg | ORAL_TABLET | Freq: Two times a day (BID) | ORAL | Status: DC | PRN

## 2014-06-13 MED ORDER — FERROUS SULFATE 325 (65 FE) MG PO TBEC: 325.0000 mg | DELAYED_RELEASE_TABLET | Freq: Every day | ORAL | Status: DC

## 2014-06-13 MED ORDER — PREDNISONE 10 MG PO TABS: 10.0000 mg | ORAL_TABLET | Freq: Every day | ORAL | Status: DC

## 2014-06-13 MED ORDER — OMEGA-3-ACID ETHYL ESTERS 1 G PO CAPS
1.0000 g | ORAL_CAPSULE | Freq: Every day | ORAL | Status: DC
  Administered 2014-06-13 – 2014-06-14 (×2): 1 g via ORAL
  Filled 2014-06-13 (×2): qty 1

## 2014-06-13 MED ORDER — SIMVASTATIN 40 MG PO TABS
40.0000 mg | ORAL_TABLET | Freq: Every day | ORAL | Status: DC
  Administered 2014-06-13: 18:00:00 40 mg via ORAL
  Filled 2014-06-13: qty 1

## 2014-06-13 MED ORDER — FERROUS SULFATE 325 (65 FE) MG PO TABS
325.0000 mg | ORAL_TABLET | Freq: Every day | ORAL | Status: DC
  Administered 2014-06-13 – 2014-06-14 (×2): 325 mg via ORAL
  Filled 2014-06-13 (×2): qty 1

## 2014-06-13 MED ORDER — IPRATROPIUM-ALBUTEROL 0.5-2.5 (3) MG/3ML IN SOLN
3.0000 mL | Freq: Once | RESPIRATORY_TRACT | Status: AC
  Administered 2014-06-13: 3 mL via RESPIRATORY_TRACT

## 2014-06-13 MED ORDER — CITALOPRAM HYDROBROMIDE 20 MG PO TABS
40.0000 mg | ORAL_TABLET | Freq: Every day | ORAL | Status: DC
  Administered 2014-06-13 – 2014-06-14 (×2): 40 mg via ORAL
  Filled 2014-06-13 (×2): qty 2

## 2014-06-13 MED ORDER — PREDNISONE 50 MG PO TABS
50.0000 mg | ORAL_TABLET | Freq: Every day | ORAL | Status: AC
  Administered 2014-06-14: 50 mg via ORAL
  Filled 2014-06-13: qty 1

## 2014-06-13 MED ORDER — CHOLECALCIFEROL 25 MCG (1000 UT) PO TABS
1000.0000 [IU] | ORAL_TABLET | Freq: Every day | ORAL | Status: DC
  Administered 2014-06-13 – 2014-06-14 (×2): 1000 [IU] via ORAL
  Filled 2014-06-13 (×2): qty 1

## 2014-06-13 MED ORDER — PREDNISONE 50 MG PO TABS
60.0000 mg | ORAL_TABLET | Freq: Every day | ORAL | Status: AC
  Administered 2014-06-13: 60 mg via ORAL
  Filled 2014-06-13: qty 1

## 2014-06-13 MED ORDER — METHYLPREDNISOLONE SODIUM SUCC 125 MG IJ SOLR
INTRAMUSCULAR | Status: AC
  Administered 2014-06-13: 125 mg via INTRAVENOUS
  Filled 2014-06-13: qty 2

## 2014-06-13 MED ORDER — DOCUSATE SODIUM 100 MG PO CAPS
100.0000 mg | ORAL_CAPSULE | Freq: Two times a day (BID) | ORAL | Status: DC
  Administered 2014-06-13 – 2014-06-14 (×2): 100 mg via ORAL
  Filled 2014-06-13 (×3): qty 1

## 2014-06-13 MED ORDER — HEPARIN SODIUM (PORCINE) 5000 UNIT/ML IJ SOLN
5000.0000 [IU] | Freq: Three times a day (TID) | INTRAMUSCULAR | Status: DC
  Administered 2014-06-13 – 2014-06-14 (×4): 5000 [IU] via SUBCUTANEOUS
  Filled 2014-06-13 (×4): qty 1

## 2014-06-13 NOTE — Plan of Care (Signed)
Problem: Discharge Progression Outcomes Goal: Discharge plan in place and appropriate Outcome: Progressing Patient is a High Fall Risk, multiple falls at home. Does not use any assistive equipment From home with husband and daughter Hx of Asthma, GERD, COPD, HTN, hyperlipidemia, continue home medications. Pt states she was exposed to an upper respiratory tract infection last Thursday Goal: Other Discharge Outcomes/Goals Outcome: Progressing Patient is alert and oriented, standby assist up to the BR. Currently on RA, becomes SOB and has wheezes with exertion. No c/o pain at this time. Running sinus tach to sinus rhythm on telemetry.

## 2014-06-13 NOTE — Progress Notes (Addendum)
[facility] [place] NAME: Jenna [name]    MR#:  [id number]  DATE OF BIRTH:  [birth year]  SUBJECTIVE:  CHIEF COMPLAINT:  Cough, shortness of breath and wheezing, chest tightness. Chief Complaint  Patient presents with  . Shortness of Breath    Pt presents to ER alert and in severe distress. Pt has audible wheezing, brought straight back to room.    REVIEW OF SYSTEMS:  CONSTITUTIONAL: No fever, fatigue or weakness.  EYES: No blurred or double vision.  EARS, NOSE, AND THROAT: No tinnitus or ear pain.  RESPIRATORY: Positive for cough, shortness of breath and wheezing, but no hemoptysis.  CARDIOVASCULAR: No chest pain, orthopnea, edema.  GASTROINTESTINAL: No nausea, vomiting, diarrhea or abdominal pain.  GENITOURINARY: No dysuria, hematuria.  ENDOCRINE: No polyuria, nocturia,  HEMATOLOGY: No anemia, easy bruising or bleeding SKIN: No rash or lesion. MUSCULOSKELETAL: No joint pain or arthritis.   NEUROLOGIC: No tingling, numbness, weakness.  PSYCHIATRY: No anxiety or depression.   DRUG ALLERGIES:   Allergies  Allergen Reactions  . Codeine Other (See Comments)    "will not stay down"  . Hydromorphone Other (See Comments)    confusion, personality change  . Morphine Other (See Comments)    Goes crazy  . Oxycodone Nausea And Vomiting  . Oxycodone-Acetaminophen Nausea And Vomiting  . Nabumetone Rash  . Promethazine Hcl Rash and Other (See Comments)    VITALS:  Blood pressure 99/43, pulse 76, temperature 97.3 F (36.3 C), temperature source Oral, resp. rate 20, height 5\' 3"  (1.6 m), weight 93.441 kg (206 lb), SpO2 97 %.  PHYSICAL EXAMINATION:  GENERAL:  79 y.o.-year-old patient lying in the bed with no acute distress.  EYES: Pupils equal, round, reactive to light and accommodation. No scleral icterus. Extraocular muscles intact.  HEENT: Head atraumatic, normocephalic. Oropharynx and nasopharynx clear.  NECK:  Supple, no  jugular venous distention. No thyroid enlargement, no tenderness.  LUNGS: Normal breath sounds bilaterally, no wheezing, rales,rhonchi or crepitation. No use of accessory muscles of respiration.  CARDIOVASCULAR: S1, S2 normal. No murmurs, rubs, or gallops.  ABDOMEN: Soft, nontender, nondistended. Bowel sounds present. No organomegaly or mass.  EXTREMITIES: No pedal edema, cyanosis, or clubbing.  NEUROLOGIC: Cranial nerves II through XII are intact. Muscle strength 5/5 in all extremities. Sensation intact. Gait not checked.  PSYCHIATRIC: The patient is alert and oriented x 3.  SKIN: No obvious rash, lesion, or ulcer.    LABORATORY PANEL:   CBC  Recent Labs Lab 06/13/14 0051  WBC 9.5  HGB 12.0  HCT 36.0  PLT 174   ------------------------------------------------------------------------------------------------------------------  Chemistries   Recent Labs Lab 06/13/14 0051  NA 139  K 3.4*  CL 107  CO2 25  GLUCOSE 146*  BUN 16  CREATININE 0.97  CALCIUM 8.6*   ------------------------------------------------------------------------------------------------------------------  Cardiac Enzymes  Recent Labs Lab 06/13/14 0051  TROPONINI <0.03   ------------------------------------------------------------------------------------------------------------------  RADIOLOGY:  Dg Chest Port 1 View  06/13/2014   CLINICAL DATA:  Shortness of breath for 3 days.  Productive cough.  EXAM: PORTABLE CHEST - 1 VIEW  COMPARISON:  06/11/2009; chest CT - 11/01/2013  FINDINGS: Grossly unchanged cardiac silhouette and mediastinal contours. No focal parenchymal opacities. No pleural effusion or pneumothorax. No evidence of edema. No acute osseus abnormalities.  IMPRESSION: No acute cardiopulmonary disease on this AP portable examination.   Electronically Signed   By: Sandi Mariscal M.D.   On: 06/13/2014 00:51    EKG:   Orders  placed or performed during the hospital encounter of 06/12/14  . ED  EKG  . ED EKG    ASSESSMENT AND PLAN:   This is a 79 year old female with COPD admitted for acute on chronic respiratory failure.  1. Acute on chronic respiratory failure with Hypoxemia, due to COPD exacerbation. On oxygen by nasal cannula 2 L now, try to wean off oxygen.  The patient was treated with the 1 dose IV Solu-Medrol, I will continue prednisone taper, Zithromax and nebulizer treatment. 2. Chest pain: Musculoskeletal. Only associated with cough. 3. Obesity: The patient's BMI is 36.6;  status post gastric bypass surgery. Hypokalemia. I will give a call: 1 dose and check potassium and magnesium level tomorrow.   Hypertension. Controlled, continue lisinopril. Diabetes. Continue sliding scale.     All the records are reviewed and case discussed with Care Management/Social Workerr. Management plans discussed with the patient, family and they are in agreement.  CODE STATUS: Full code  TOTAL TIME TAKING CARE OF THIS PATIENT: 45 minutes.   POSSIBLE D/C IN 2 DAYS, DEPENDING ON CLINICAL CONDITION.   Demetrios Loll M.D on 06/13/2014 at 3:41 PM  Between 7am to 6pm - Pager - [phone]  After 6pm go to [email or web address] - password [email or web address] [facility]  [facility]  Office  [phone]  CC: Primary care physician; [facility], Chrissie Noa, MD

## 2014-06-13 NOTE — Care Management Note (Signed)
Case Management Note  Patient Details  Name: Jenna [name] MRN: [id number] Date of Birth: Jun 16, 1935  Subjective/Objective:                    Action/Plan:   Expected Discharge Date:                  Expected Discharge Plan:     In-House Referral:     Discharge planning Services     Post Acute Care Choice:    Choice offered to:     DME Arranged:    DME Agency:     HH Arranged:    [place] Agency:     Status of Service:     Medicare Important Message Given:   Yes Date Medicare IM Given:   06/13/14 Medicare IM give by:    Hester Mates, RN Date Additional Medicare IM Given:    Additional Medicare Important Message give by:     If discussed at [place] of Stay Meetings, dates discussed:    Additional Comments:  Anetria Harwick A, RN 06/13/2014, 8:46 AM

## 2014-06-13 NOTE — Care Management Note (Signed)
Case Management Note  Patient Details  Name: Jenna [name] MRN: [id number] Date of Birth: [birth year]  Subjective/Objective:                Initial CM assessment. Admitted 06/12/14 per COPD exacerbation. No home oxygen. Has a rolling walker, bedside commode and a nebulizer machine at home. PCP is Dr Ellison Hughs. Pharmacy is [facility] in [place] ph: [phone].    Expected Discharge Date:                  Expected Discharge Plan:     In-House Referral:  NA  Discharge planning Services     Post Acute Care Choice:  NA Choice offered to:  NA  DME Arranged:    DME Agency:     HH Arranged:    HH Agency:     Status of Service:     Medicare Important Message Given:  Yes Date Medicare IM Given:  06/13/14 Medicare IM give by:   Hester Mates, RN Date Additional Medicare IM Given:    Additional Medicare Important Message give by:     If discussed at [place] of Stay Meetings, dates discussed:  06/13/14.  Additional Comments:  Karlina Suares A, RN 06/13/2014, 11:23 AM

## 2014-06-13 NOTE — Progress Notes (Signed)
   06/13/14 1400  Clinical Encounter Type  Visited With Patient  Visit Type Spiritual support  Referral From Nurse  Consult/Referral To Chaplain  Spiritual Encounters  Spiritual Needs Prayer  Stress Factors  Patient Stress Factors Health changes  Family Stress Factors None identified  Advance Directives (For Healthcare)  Does patient have an advance directive? No  Would patient like information on creating an advanced directive? Yes - Scientist, clinical (histocompatibility and immunogenetics) given   Chaplain provided therapeutic presence, empathic listening and prayer. Patient was thankful for the visit by Chaplain.   [phone]

## 2014-06-13 NOTE — Plan of Care (Signed)
Problem: Discharge Progression Outcomes Goal: Discharge plan in place and appropriate Individualization: Patient is a High Fall Risk, multiple falls at home. Bilateral knee replacement. 1 asst to BR.   Does not use any assistive equipment. From home with husband and daughter Hx of Asthma, GERD, COPD, HTN, hyperlipidemia, continue home medications. Pt states she was exposed to an upper respiratory tract infection last Thursday Pt had gastric bypass in 2010.

## 2014-06-13 NOTE — ED Notes (Addendum)
Pt began to demonstrate SPO2 readings of 80-88% on RA when ambulating in hallway. Pt also demonstrated nasal flaring, cough, SOB, and audible wheezing without use of stethoscope. MD notified and verbal order for Duoneb placed.

## 2014-06-13 NOTE — H&P (Signed)
Jenna [name] is an 79 y.o. female.   Chief Complaint: Cough HPI: The patient presents to the emergency department planing of cough for 2 days. It is productive of clear sputum. She thinks the cough began at the same time she slept under a moldy blanket at her friend's home. He admits to shortness of breath and some chest pain but only with cough. She denies nausea and vomiting but has been spitting thick mucus at times. Due to continued chest tightness with breathing after multiple breathing treatments and Solu-Medrol in the emergency department the treating physician called for admission  Past Medical History  Diagnosis Date  . Hypertension   . Hyperlipidemia   . Diabetes mellitus without complication   . Syncope and collapse   . Palpitations   . Sleep apnea   . Asthma   . GERD (gastroesophageal reflux disease)   . COPD (chronic obstructive pulmonary disease)   . Hearing loss of both ears   . Encounter for colonoscopy due to history of adenomatous colonic polyps     Past Surgical History  Procedure Laterality Date  . Gastric bypass    . Total abdominal hysterectomy    . Cholecystectomy    . Replacement total knee bilateral    . Shoulder surgery      left   . Tonsillectomy    . Colectomy    . Cosmetic surgery      tummy tuck and excess skin removal    Family History  Problem Relation Age of Onset  . Heart attack Mother   . Hypertension Mother   . Heart attack Brother   . Heart attack Sister 32  . Hyperlipidemia Sister   . Hypertension Sister    Social History:  reports that she has quit smoking. Her smoking use included Cigarettes. She has a 3.75 pack-year smoking history. She does not have any smokeless tobacco history on file. She reports that she does not drink alcohol or use illicit drugs.  Allergies:  Allergies  Allergen Reactions  . Codeine Other (See Comments)    "will not stay down"  . Hydromorphone Other (See Comments)    confusion, personality change   . Morphine Other (See Comments)    Goes crazy  . Oxycodone Nausea And Vomiting  . Oxycodone-Acetaminophen Nausea And Vomiting  . Nabumetone Rash  . Promethazine Hcl Rash and Other (See Comments)    Medications Prior to Admission  Medication Sig Dispense Refill  . Ascorbic Acid (VITAMIN C) 1000 MG tablet Take 500 mg by mouth daily.     . Cholecalciferol (VITAMIN D-1000 MAX ST) 1000 UNITS tablet Take 1,000 Units by mouth daily.     . citalopram (CELEXA) 40 MG tablet Take 40 mg by mouth daily.     . Cyanocobalamin (RA VITAMIN B-12 TR) 1000 MCG TBCR Take 1,000 mcg by mouth daily.     . ferrous sulfate 325 (65 FE) MG EC tablet Take by mouth.    . furosemide (LASIX) 20 MG tablet TAKE ONE TABLET BY MOUTH ONCE DAILY    . lisinopril (PRINIVIL,ZESTRIL) 40 MG tablet Take 1/2 tablet once a day    . Omega-3 Fatty Acids (FISH OIL) 1000 MG CAPS Take 1,000 mg by mouth daily.    Marland Kitchen omeprazole (PRILOSEC) 40 MG capsule Take 1 capsule (40 mg total) by mouth daily. 60 capsule 6  . Pediatric Multiple Vit-C-FA (CHEWABLE VITE CHILDRENS) CHEW Chew by mouth daily.     . ranitidine (ZANTAC) 150 MG tablet Take  150 mg by mouth 2 (two) times daily.     . simvastatin (ZOCOR) 40 MG tablet Take 40 mg by mouth daily at 6 PM.       Results for orders placed or performed during the hospital encounter of 06/12/14 (from the past 48 hour(s))  CBC with Differential     Status: Abnormal   Collection Time: 06/13/14 12:51 AM  Result Value Ref Range   WBC 9.5 3.6 - 11.0 K/uL   RBC 3.67 (L) 3.80 - 5.20 MIL/uL   Hemoglobin 12.0 12.0 - 16.0 g/dL   HCT 36.0 35.0 - 47.0 %   MCV 98.3 80.0 - 100.0 fL   MCH 32.8 26.0 - 34.0 pg   MCHC 33.4 32.0 - 36.0 g/dL   RDW 13.1 11.5 - 14.5 %   Platelets 174 150 - 440 K/uL   Neutrophils Relative % 76 %   Neutro Abs 7.2 (H) 1.4 - 6.5 K/uL   Lymphocytes Relative 17 %   Lymphs Abs 1.6 1.0 - 3.6 K/uL   Monocytes Relative 5 %   Monocytes Absolute 0.5 0.2 - 0.9 K/uL   Eosinophils Relative 1 %    Eosinophils Absolute 0.1 0 - 0.7 K/uL   Basophils Relative 1 %   Basophils Absolute 0.1 0 - 0.1 K/uL  Basic metabolic panel     Status: Abnormal   Collection Time: 06/13/14 12:51 AM  Result Value Ref Range   Sodium 139 135 - 145 mmol/L   Potassium 3.4 (L) 3.5 - 5.1 mmol/L   Chloride 107 101 - 111 mmol/L   CO2 25 22 - 32 mmol/L   Glucose, Bld 146 (H) 65 - 99 mg/dL   BUN 16 6 - 20 mg/dL   Creatinine, Ser 0.97 0.44 - 1.00 mg/dL   Calcium 8.6 (L) 8.9 - 10.3 mg/dL   GFR calc non Af Amer 55 (L) >60 mL/min   GFR calc Af Amer >60 >60 mL/min    Comment: (NOTE) The eGFR has been calculated using the CKD EPI equation. This calculation has not been validated in all clinical situations. eGFR's persistently <60 mL/min signify possible Chronic Kidney Disease.    Anion gap 7 5 - 15  Brain natriuretic peptide     Status: None   Collection Time: 06/13/14 12:51 AM  Result Value Ref Range   B Natriuretic Peptide 45.0 0.0 - 100.0 pg/mL  Troponin I     Status: None   Collection Time: 06/13/14 12:51 AM  Result Value Ref Range   Troponin I <0.03 <0.031 ng/mL    Comment:        NO INDICATION OF MYOCARDIAL INJURY.    Dg Chest Port 1 View  06/13/2014   CLINICAL DATA:  Shortness of breath for 3 days.  Productive cough.  EXAM: PORTABLE CHEST - 1 VIEW  COMPARISON:  06/11/2009; chest CT - 11/01/2013  FINDINGS: Grossly unchanged cardiac silhouette and mediastinal contours. No focal parenchymal opacities. No pleural effusion or pneumothorax. No evidence of edema. No acute osseus abnormalities.  IMPRESSION: No acute cardiopulmonary disease on this AP portable examination.   Electronically Signed   By: Sandi Mariscal M.D.   On: 06/13/2014 00:51    Review of Systems  Constitutional: Negative for fever and chills.  HENT: Negative for sore throat and tinnitus.   Eyes: Negative for blurred vision and redness.  Respiratory: Positive for cough, sputum production, shortness of breath and wheezing.    Cardiovascular: Positive for chest pain. Negative for palpitations, orthopnea and  PND.       Pain only with cough  Gastrointestinal: Negative for nausea, vomiting, abdominal pain and diarrhea.  Genitourinary: Negative for dysuria, urgency and frequency.  Musculoskeletal: Negative for myalgias and joint pain.  Skin: Negative for rash.       No lesions  Neurological: Negative for speech change, focal weakness and weakness.  Endo/Heme/Allergies: Does not bruise/bleed easily.       No temperature intolerance  Psychiatric/Behavioral: Negative for depression and suicidal ideas.    Blood pressure 132/49, pulse 96, temperature 99.2 F (37.3 C), temperature source Oral, resp. rate 20, height 5' 3"  (1.6 m), weight 93.441 kg (206 lb), SpO2 96 %. Physical Exam  Nursing note and vitals reviewed. Constitutional: She is oriented to person, place, and time. She appears well-developed and well-nourished.  HENT:  Head: Normocephalic and atraumatic.  Eyes: EOM are normal. Pupils are equal, round, and reactive to light.  Neck: Normal range of motion. No tracheal deviation present. No thyromegaly present.  Cardiovascular: Normal rate, regular rhythm and normal heart sounds.  Exam reveals no gallop and no friction rub.   No murmur heard. Respiratory: Effort normal. No respiratory distress. She has wheezes. She has no rales.  Decreased air movements  GI: Soft. Bowel sounds are normal. She exhibits no distension. There is no tenderness.  Lymphadenopathy:    She has no cervical adenopathy.  Neurological: She is alert and oriented to person, place, and time. No cranial nerve deficit. She exhibits normal muscle tone.  Skin: Skin is warm and dry.  Psychiatric: She has a normal mood and affect. Judgment and thought content normal.     Assessment/Plan This is a 79 year old female with COPD admitted for acute on chronic respiratory failure. 1. Acute on chronic respiratory failure: Hypoxemia originally  present. No oxygen requirement at this time but the patient still complains of chest tightness. She has decreased air movement on physical exam and expiratory wheezes. We will continue a steroid taper as well as antibiotics for decreasing inflammatory effect. The patient is not on a maintenance inhaler. She reports a past medical history of COPD and asthma. I will start an inhaled corticosteroid. We will continue breathing treatments as needed. 2. Chest pain: Musculoskeletal. Only associated with cough. 3. Obesity: The patient's BMI is 36.6; encourage healthy diet and exercise. The patient is status post gastric bypass surgery. 4. DVT prophylaxis: Heparin. 5. GI prophylaxis: None The patient is a full code. Time spent on admission orders and patient care approximately 35 minutes.  Harrie Foreman 06/13/2014, 6:02 AM

## 2014-06-13 NOTE — Care Management Note (Signed)
Case Management Note  Patient Details  Name: Elaysha Bevard MRN: [id number] Date of Birth: 04/06/1935  Subjective/Objective:                    Action/Plan:   Expected Discharge Date:                  Expected Discharge Plan:     In-House Referral:  NA  Discharge planning Services     Post Acute Care Choice:  NA Choice offered to:  NA  DME Arranged:    DME Agency:     HH Arranged:    [place] Agency:     Status of Service:     Medicare Important Message Given:  Yes Date Medicare IM Given:  06/13/14 Medicare IM give by:   Hester Mates, RN Date Additional Medicare IM Given:    Additional Medicare Important Message give by:     If discussed at [place] of Stay Meetings, dates discussed:   06/13/14  Additional Comments:  Nikalas Bramel A, RN 06/13/2014, 11:22 AM

## 2014-06-13 NOTE — ED Provider Notes (Signed)
[facility] Emergency Department Provider Note  ____________________________________________  Time seen: Approximately on arrival from triage  I have reviewed the triage vital signs and the nursing notes.   HISTORY  Chief Complaint Shortness of Breath    HPI Jenna [name] is a 79 y.o. female patient reports cough for a couple days worse last night and then increasing shortness of breath this afternoon the cough is productive of some cloudy phlegm and some clear phlegm is a normal amount of swelling in her legs she has a long history of multiple medical problems including asthma sleep apnea and COPD on arrival in triage she was very tight and was started immediately on 3 albuterol nebs back-to-back and I saw her as soon as she got into the ER room patient had improved to the point where she was told to But was able to speak in 5-6 word sentences and gave me the above history she says she was exposed to an upper respiratory infection several days ago and thinks that probably what triggered this what she feels is an asthma exacerbation she also reports she has some burning in her chest that she has had in the past with a bad asthma exacerbation she is not having any chest heaviness or tightness   Past Medical History  Diagnosis Date  . Hypertension   . Hyperlipidemia   . Diabetes mellitus without complication   . Syncope and collapse   . Palpitations   . Sleep apnea   . Asthma   . GERD (gastroesophageal reflux disease)   . COPD (chronic obstructive pulmonary disease)   . Hearing loss of both ears   . Encounter for colonoscopy due to history of adenomatous colonic polyps     Patient Active Problem List   Diagnosis Date Noted  . Pericardial effusion 11/13/2013  . Morbid obesity 11/13/2013  . Hyperlipidemia 11/13/2013  . Esophageal reflux 11/13/2013  . Bilateral leg edema 11/13/2013  . SOB (shortness of breath) 11/13/2013  . Chronic cough 11/13/2013     Past Surgical History  Procedure Laterality Date  . Gastric bypass    . Total abdominal hysterectomy    . Cholecystectomy    . Replacement total knee bilateral    . Shoulder surgery      left   . Tonsillectomy      Current Outpatient Rx  Name  Route  Sig  Dispense  Refill  . Ascorbic Acid (VITAMIN C) 1000 MG tablet   Oral   Take 500 mg by mouth daily.          . Cholecalciferol (VITAMIN D-1000 MAX ST) 1000 UNITS tablet   Oral   Take 1,000 Units by mouth daily.          . citalopram (CELEXA) 40 MG tablet   Oral   Take 40 mg by mouth daily.          . Cyanocobalamin (RA VITAMIN B-12 TR) 1000 MCG TBCR   Oral   Take 1,000 mcg by mouth daily.          . ferrous sulfate 325 (65 FE) MG EC tablet   Oral   Take by mouth.         . furosemide (LASIX) 20 MG tablet      TAKE ONE TABLET BY MOUTH ONCE DAILY         . lisinopril (PRINIVIL,ZESTRIL) 40 MG tablet      Take 1/2 tablet once a day         .  Omega-3 Fatty Acids (FISH OIL) 1000 MG CAPS   Oral   Take 1,000 mg by mouth daily.         Marland Kitchen omeprazole (PRILOSEC) 40 MG capsule   Oral   Take 1 capsule (40 mg total) by mouth daily.   60 capsule   6   . Pediatric Multiple Vit-C-FA (CHEWABLE VITE CHILDRENS) CHEW   Oral   Chew by mouth daily.          . ranitidine (ZANTAC) 150 MG tablet   Oral   Take 150 mg by mouth 2 (two) times daily.          . simvastatin (ZOCOR) 40 MG tablet   Oral   Take 40 mg by mouth daily at 6 PM.            Allergies Codeine; Hydromorphone; Morphine; Oxycodone; Oxycodone-acetaminophen; Promethazine hcl; and Nabumetone  Family History  Problem Relation Age of Onset  . Heart attack Mother   . Hypertension Mother   . Heart attack Brother   . Heart attack Sister 85  . Hyperlipidemia Sister   . Hypertension Sister     Social History History  Substance Use Topics  . Smoking status: Former Smoker -- 0.25 packs/day for 15 years    Types: Cigarettes  .  Smokeless tobacco: Not on file  . Alcohol Use: No    Review of Systems  Constitutional: No fever/chills Eyes: No visual changes. ENT: No sore throat. Cardiovascular: Denies chest pain. Respiratory: cough Gastrointestinal: No abdominal pain.  No nausea, no vomiting.  No diarrhea.  No constipation. Genitourinary: Negative for dysuria. Musculoskeletal: Negative for back pain. Skin: Negative for rash. Neurological: Negative for headaches, focal weakness or numbness.  10-point ROS otherwise negative.  ____________________________________________   PHYSICAL EXAM:  VITAL SIGNS: ED Triage Vitals  Enc Vitals Group     BP 06/12/14 2358 158/108 mmHg     Pulse Rate 06/12/14 2358 109     Resp 06/12/14 2358 24     Temp 06/13/14 0004 98.8 F (37.1 C)     Temp Source 06/13/14 0004 Oral     SpO2 06/12/14 2358 99 %     Weight 06/12/14 2358 206 lb (93.441 kg)     Height 06/12/14 2358 5\' 3"  (1.6 m)     Head Cir --      Peak Flow --      Pain Score --      Pain Loc --      Pain Edu? --      Excl. in [place]? --     Constitutional: Alert and oriented. Short of breath Eyes: Conjunctivae are normal. PERRL. EOMI. Head: Atraumatic. Nose: No congestion/rhinnorhea. Mouth/Throat: Mucous membranes are moist.  Oropharynx non-erythematous. Neck: No stridor. Cardiovascular: Normal rate, regular rhythm. Grossly normal heart sounds.  Good peripheral circulation. Respiratory: Patient is using accessory muscles and has some retractions lungs are clear but there is a prolonged expiratory phase Gastrointestinal: Soft and nontender. No distention. No abdominal bruits. No CVA tenderness. Musculoskeletal: Resume moderate amount of edema bilaterally this is normal for the patient.  No joint effusions. Neurologic:  Normal speech and language. No gross focal neurologic deficits are appreciated. Speech is normal. No gait instability. Skin:  Skin is warm, dry and intact. No rash noted. Psychiatric: Mood and  affect are normal. Speech and behavior are normal.  ____________________________________________   LABS (all labs ordered are listed, but only abnormal results are displayed)  Labs Reviewed  CBC WITH DIFFERENTIAL/PLATELET  BASIC METABOLIC PANEL  BRAIN NATRIURETIC PEPTIDE  TROPONIN I   ____________________________________________  EKG  EKG read by me normal sinus rhythm rate of 90 left axis nonspecific ST-T wave changes diffusely ____________________________________________  RADIOLOGY  No acute disease by radiology ____________________________________________   PROCEDURES  Procedure(s) performed: None  Critical Care performed: No  ____________________________________________   INITIAL IMPRESSION / ASSESSMENT AND PLAN / ED COURSE  Pertinent labs & imaging results that were available during my care of the patient were reviewed by me and considered in my medical decision making (see chart for details).   ____________________________________________   FINAL CLINICAL IMPRESSION(S) / ED DIAGNOSES  Final diagnoses:  Chronic obstructive pulmonary disease with acute exacerbation     Nena Polio, MD 06/13/14 [phone]

## 2014-06-13 NOTE — Plan of Care (Signed)
Problem: Discharge Progression Outcomes Goal: Other Discharge Outcomes/Goals Plan of Care progress to goal: Pt states that she feels better except for when she walks to [place].  Gets Very SOB and has exp wheezes.  Tried to give azithromycin today and she had an immediate reaction -  Epigastric burning and nausea. Prednisone prevents her from sleeping - got order for ambien.

## 2014-06-14 LAB — BASIC METABOLIC PANEL
Anion gap: 8 (ref 5–15)
BUN: 28 mg/dL — ABNORMAL HIGH (ref 6–20)
CO2: 23 mmol/L (ref 22–32)
Calcium: 8.8 mg/dL — ABNORMAL LOW (ref 8.9–10.3)
Chloride: 110 mmol/L (ref 101–111)
Creatinine, Ser: 1.07 mg/dL — ABNORMAL HIGH (ref 0.44–1.00)
GFR calc Af Amer: 57 mL/min — ABNORMAL LOW (ref >60)
GFR calc non Af Amer: 49 mL/min — ABNORMAL LOW (ref >60)
Glucose, Bld: 121 mg/dL — ABNORMAL HIGH (ref 65–99)
Potassium: 4.4 mmol/L (ref 3.5–5.1)
Sodium: 141 mmol/L (ref 135–145)

## 2014-06-14 LAB — MAGNESIUM: Magnesium: 1.7 mg/dL (ref 1.7–2.4)

## 2014-06-14 MED ORDER — MAGNESIUM SULFATE 2 GM/50ML IV SOLN
2.0000 g | Freq: Once | INTRAVENOUS | Status: AC
  Administered 2014-06-14: 2 g via INTRAVENOUS
  Filled 2014-06-14: qty 50

## 2014-06-14 MED ORDER — ZOLPIDEM TARTRATE 5 MG PO TABS: 5.0000 mg | ORAL_TABLET | Freq: Every evening | ORAL | Status: DC | PRN

## 2014-06-14 MED ORDER — PREDNISONE 10 MG PO TABS: 10.0000 mg | ORAL_TABLET | Freq: Every day | ORAL | Status: DC

## 2014-06-14 MED ORDER — ALBUTEROL SULFATE HFA 108 (90 BASE) MCG/ACT IN AERS: 2.0000 | INHALATION_SPRAY | Freq: Four times a day (QID) | RESPIRATORY_TRACT | Status: DC | PRN

## 2014-06-14 MED ORDER — FLUTICASONE-SALMETEROL 250-50 MCG/DOSE IN AEPB: 1.0000 | INHALATION_SPRAY | Freq: Two times a day (BID) | RESPIRATORY_TRACT | Status: DC

## 2014-06-14 NOTE — Discharge Summary (Signed)
[facility] NAME: Jenna [name]    MR#:  [id number]  DATE OF BIRTH:  Dec 05, 1935  DATE OF ADMISSION:  06/12/2014 ADMITTING PHYSICIAN: Harrie Foreman, MD  DATE OF DISCHARGE: 06/14/2014 PRIMARY CARE PHYSICIAN: FELDPAUSCH, DALE E, MD    ADMISSION DIAGNOSIS:  Chronic obstructive pulmonary disease with acute exacerbation [J44.1]   DISCHARGE DIAGNOSIS:  Chronic obstructive pulmonary disease with acute exacerbation  SECONDARY DIAGNOSIS:   Past Medical History  Diagnosis Date  . Hypertension   . Hyperlipidemia   . Diabetes mellitus without complication   . Syncope and collapse   . Palpitations   . Sleep apnea   . Asthma   . GERD (gastroesophageal reflux disease)   . COPD (chronic obstructive pulmonary disease)   . Hearing loss of both ears   . Encounter for colonoscopy due to history of adenomatous colonic polyps     HOSPITAL COURSE:   The patient presented to the emergency department for SOB, chest tightness and cough for 2 days. She was diagnosed with acute on chronic respiratory failure with Hypoxemia, due to COPD exacerbation. She has been treated with the IV Solu-Medrol and DuoNeb, in addition, she has been treated with oxygen by nasal cannula 2 L. the patient was started with the Zithromax, but she had the reaction with the nausea, so Zithromax was discontinued. Her symptoms has much improved. She was wean off oxygen this morning. The patient was treated with a potassium for hypokalemia, which resolved.   DISCHARGE CONDITIONS:   Stable.  CONSULTS OBTAINED:     DRUG ALLERGIES:   Allergies  Allergen Reactions  . Codeine Other (See Comments)    "will not stay down"  . Hydromorphone Other (See Comments)    confusion, personality change  . Morphine Other (See Comments)    Goes crazy  . Oxycodone Nausea And Vomiting  . Oxycodone-Acetaminophen Nausea And Vomiting  . Nabumetone Rash  . Promethazine Hcl Rash  and Other (See Comments)    DISCHARGE MEDICATIONS:   Current Discharge Medication List    START taking these medications   Details  albuterol (PROVENTIL HFA;VENTOLIN HFA) 108 (90 BASE) MCG/ACT inhaler Inhale 2 puffs into the lungs every 6 (six) hours as needed for wheezing or shortness of breath. Qty: 1 Inhaler, Refills: 2    Fluticasone-Salmeterol (ADVAIR DISKUS) 250-50 MCG/DOSE AEPB Inhale 1 puff into the lungs 2 (two) times daily. Qty: 60 each, Refills: 0    predniSONE (DELTASONE) 10 MG tablet Take 1 tablet (10 mg total) by mouth daily with breakfast. Qty: 14 tablet, Refills: 0    zolpidem (AMBIEN) 5 MG tablet Take 1 tablet (5 mg total) by mouth at bedtime as needed for sleep. Qty: 7 tablet, Refills: 0      CONTINUE these medications which have NOT CHANGED   Details  acetaminophen (TYLENOL) 500 MG tablet Take 500 mg by mouth as needed.    docusate sodium (COLACE) 250 MG capsule Take 250 mg by mouth as needed for constipation.    Ascorbic Acid (VITAMIN C) 1000 MG tablet Take 500 mg by mouth daily.     Cholecalciferol (VITAMIN D-1000 MAX ST) 1000 UNITS tablet Take 1,000 Units by mouth daily.     citalopram (CELEXA) 40 MG tablet Take 40 mg by mouth daily.     Cyanocobalamin (RA VITAMIN B-12 TR) 1000 MCG TBCR Take 1,000 mcg by mouth daily.     ferrous sulfate 325 (65 FE) MG EC tablet Take  by mouth.    furosemide (LASIX) 20 MG tablet TAKE ONE TABLET BY MOUTH ONCE DAILY    lisinopril (PRINIVIL,ZESTRIL) 40 MG tablet Take 1/2 tablet once a day    Omega-3 Fatty Acids (FISH OIL) 1000 MG CAPS Take 1,000 mg by mouth daily.    Pediatric Multiple Vit-C-FA (CHEWABLE VITE CHILDRENS) CHEW Chew by mouth daily.     ranitidine (ZANTAC) 150 MG tablet Take 150 mg by mouth 2 (two) times daily.     simvastatin (ZOCOR) 40 MG tablet Take 40 mg by mouth daily at 6 PM.          DISCHARGE INSTRUCTIONS:    Regular diet. Activity as tolerated.  If you experience worsening of your  admission symptoms, develop shortness of breath, life threatening emergency, suicidal or homicidal thoughts you must seek medical attention immediately by calling 911 or calling your MD immediately  if symptoms less severe.  You Must read complete instructions/literature along with all the possible adverse reactions/side effects for all the Medicines you take and that have been prescribed to you. Take any new Medicines after you have completely understood and accept all the possible adverse reactions/side effects.   Please note  You were cared for by a hospitalist during your hospital stay. If you have any questions about your discharge medications or the care you received while you were in the hospital after you are discharged, you can call the unit and asked to speak with the hospitalist on call if the hospitalist that took care of you is not available. Once you are discharged, your primary care physician will handle any further medical issues. Please note that NO REFILLS for any discharge medications will be authorized once you are discharged, as it is imperative that you return to your primary care physician (or establish a relationship with a primary care physician if you do not have one) for your aftercare needs so that they can reassess your need for medications and monitor your lab values.    Today   SUBJECTIVE   Shortness of breathand the cough has much improved.   VITAL SIGNS:  Blood pressure 123/78, pulse 52, temperature 97.6 F (36.4 C), temperature source Oral, resp. rate 16, height 5\' 3"  (1.6 m), weight 101.56 kg (223 lb 14.4 oz), SpO2 94 %.  I/O:   Intake/Output Summary (Last 24 hours) at 06/14/14 1331 Last data filed at 06/14/14 0953  Gross per 24 hour  Intake    240 ml  Output    850 ml  Net   -610 ml    PHYSICAL EXAMINATION:  GENERAL:  79 y.o.-year-old patient lying in the bed with no acute distress.  EYES: Pupils equal, round, reactive to light and accommodation.  No scleral icterus. Extraocular muscles intact.  HEENT: Head atraumatic, normocephalic. Oropharynx and nasopharynx clear.  NECK:  Supple, no jugular venous distention. No thyroid enlargement, no tenderness.  LUNGS: Normal breath sounds bilaterally, mild wheezing, no rales,rhonchi or crepitation. No use of accessory muscles of respiration.  CARDIOVASCULAR: S1, S2 normal. No murmurs, rubs, or gallops.  ABDOMEN: Soft, non-tender, non-distended. Bowel sounds present. No organomegaly or mass.  EXTREMITIES: No pedal edema, cyanosis, or clubbing.  NEUROLOGIC: Cranial nerves II through XII are intact. Muscle strength 5/5 in all extremities. Sensation intact. Gait not checked.  PSYCHIATRIC: The patient is alert and oriented x 3.  SKIN: No obvious rash, lesion, or ulcer.   DATA REVIEW:   CBC  Recent Labs Lab 06/13/14 0051  WBC 9.5  HGB  12.0  HCT 36.0  PLT 174    Chemistries   Recent Labs Lab 06/14/14 0600  NA 141  K 4.4  CL 110  CO2 23  GLUCOSE 121*  BUN 28*  CREATININE 1.07*  CALCIUM 8.8*  MG 1.7    Cardiac Enzymes  Recent Labs Lab 06/13/14 0051  TROPONINI <0.03    Microbiology Results  No results found for this or any previous visit.  RADIOLOGY:  Dg Chest Port 1 View  06/13/2014   CLINICAL DATA:  Shortness of breath for 3 days.  Productive cough.  EXAM: PORTABLE CHEST - 1 VIEW  COMPARISON:  06/11/2009; chest CT - 11/01/2013  FINDINGS: Grossly unchanged cardiac silhouette and mediastinal contours. No focal parenchymal opacities. No pleural effusion or pneumothorax. No evidence of edema. No acute osseus abnormalities.  IMPRESSION: No acute cardiopulmonary disease on this AP portable examination.   Electronically Signed   By: Sandi Mariscal M.D.   On: 06/13/2014 00:51        Discharge plans discussed with the patient, and case manager.  CODE STATUS:     Code Status Orders        Start     Ordered   06/13/14 0524  Full code   Continuous     06/13/14 0523       TOTAL TIME TAKING CARE OF THIS PATIENT: 41 minutes.    Demetrios Loll M.D on 06/14/2014 at 1:31 PM  Between 7am to 6pm - Pager - [phone]  After 6pm go to [email or web address] - password [email or web address] [facility]  [facility]  Office  [phone]  CC: Primary care physician; [facility], Chrissie Noa, MD

## 2014-06-14 NOTE — Plan of Care (Signed)
Problem: Discharge Progression Outcomes Goal: Discharge plan in place and appropriate Individualization: Patient is a High Fall Risk, multiple falls at home. Bilateral knee replacement. 1 asst to BR.    Does not use any assistive equipment. From home with husband and daughter Hx of Asthma, GERD, COPD, HTN, hyperlipidemia, continue home medications. Pt states she was exposed to an upper respiratory tract infection last Thursday Pt had gastric bypass in 2010.

## 2014-06-14 NOTE — Plan of Care (Addendum)
Problem: Discharge Progression Outcomes Goal: Other Discharge Outcomes/Goals Plan of Care Progress to Goals:  Hemodynamically:  VSS   (BP needs to be taken manually in the forearm) Afebrile  No pain  SVN treatments continue. Pt less SOB and has more productive cough after treatments. O2 at 2L continues. Prednisone taper continues. Ambien given for sleep.

## 2014-06-14 NOTE — Discharge Instructions (Signed)
Acute Respiratory Failure °Respiratory failure is when your lungs are not working well and your breathing (respiratory) system fails. When respiratory failure occurs, it is difficult for your lungs to get enough oxygen, get rid of carbon dioxide, or both. Respiratory failure can be life threatening.  °Respiratory failure can be acute or chronic. Acute respiratory failure is sudden, severe, and requires emergency medical treatment. Chronic respiratory failure is less severe, happens over time, and requires ongoing treatment.  °WHAT ARE THE CAUSES OF ACUTE RESPIRATORY FAILURE?  °Any problem affecting the heart or lungs can cause acute respiratory failure. Some of these causes include the following: °· Chronic bronchitis and emphysema (COPD).   °· Blood clot going to a lung (pulmonary embolism).   °· Having water in the lungs caused by heart failure, lung injury, or infection (pulmonary edema).   °· Collapsed lung (pneumothorax).   °· Pneumonia.   °· Pulmonary fibrosis.   °· Obesity.   °· Asthma.   °· Heart failure.   °· Any type of trauma to the chest that can make breathing difficult.   °· Nerve or muscle diseases making chest movements difficult. °WHAT SYMPTOMS SHOULD YOU WATCH FOR?  °If you have any of these signs or symptoms, you should seek immediate medical care:  °· You have shortness of breath (dyspnea) with or without activity.   °· You have rapid, fast breathing (tachypnea).   °· You are wheezing. °· You are unable to say more than a few words without having to catch your breath. °· You find it very difficult to function normally. °· You have a fast heart rate.   °· You have a bluish color to your finger or toe nail beds.   °· You have confusion or drowsiness or both.   °HOW WILL MY ACUTE RESPIRATORY FAILURE BE TREATED?  °Treatment of acute respiratory failure depends on the cause of the respiratory failure. Usually, you will stay in the intensive care unit so your breathing can be watched closely. Treatment  can include the following: °· Oxygen. Oxygen can be delivered through the following: °¨ Nasal cannula. This is small tubing that goes in your nose to give you oxygen. °¨ Face mask. A face mask covers your nose and mouth to give you oxygen. °· Medicine. Different medicines can be given to help with breathing. These can include: °¨ Nebulizers. Nebulizers deliver medicines to open the air passages (bronchodilators). These medicines help to open or relax the airways in the lungs so you can breathe better. They can also help loosen mucus from your lungs. °¨ Diuretics. Diuretic medicines can help you breathe better by getting rid of extra water in your body. °¨ Steroids. Steroid medicines can help decrease swelling (inflammation) in your lungs. °¨ Antibiotics. °· Chest tube. If you have a collapsed lung (pneumothorax), a chest tube is placed to help reinflate the lung. °· Non-invasive positive pressure ventilation (NPPV). This is a tight-fitting mask that goes over your nose and mouth. The mask has tubing that is attached to a machine. The machine blows air into the tubing, which helps to keep the tiny air sacs (alveoli) in your lungs open. This machine allows you to breathe on your own. °· Ventilator. A ventilator is a breathing machine. When on a ventilator, a breathing tube is put into the lungs. A ventilator is used when you can no longer breathe well enough on your own. You may have low oxygen levels or high carbon dioxide (CO2) levels in your blood. When you are on a ventilator, sedation and pain medicines are given to make you sleep   so your lungs can heal. °Document Released: 01/12/2013 Document Revised: 05/24/2013 Document Reviewed: 01/12/2013 °Exit[employer]® Patient Information ©2015 [employer]. This information is not intended to replace advice given to you by your health care provider. Make sure you discuss any questions you have with your health care provider. ° °

## 2014-06-14 NOTE — Plan of Care (Addendum)
Problem: Discharge Progression Outcomes Goal: Other Discharge Outcomes/Goals Plan of Care Progress: Pt d/ced home.  Up to BR and walked around nurse's stn w/out O2.  Sated 93-95%.  Reviewed d/c  Instructions, scripts and removed IV.  Pt ready to go home.  She did request a breathing tx before she left and sd it was very beneficial.  Left for home w/husband via auxillary.  Good support system at home.

## 2014-06-14 NOTE — Care Management (Signed)
Met with patient who has now discharged to home with her husband today. She did not require home O2 per RN assessment. She has a rolling walker, cane and crutches available in the home. She declined home health needs or DME needs. Case closed.

## 2015-02-03 ENCOUNTER — Encounter: Payer: Self-pay | Admitting: *Deleted

## 2015-02-07 ENCOUNTER — Encounter: Payer: Self-pay | Admitting: *Deleted

## 2015-02-09 NOTE — Discharge Instructions (Signed)

## 2015-02-13 ENCOUNTER — Encounter
Admission: RE | Disposition: A | Discharge status: Home / Self Care | Payer: Self-pay | Source: Ambulatory Visit | Attending: Ophthalmology

## 2015-02-13 ENCOUNTER — Ambulatory Visit
Admission: RE | Admit: 2015-02-13 | Discharge: 2015-02-13 | Disposition: A | Discharge status: Home / Self Care | Payer: Medicare Other | Source: Ambulatory Visit | Attending: Ophthalmology | Admitting: Ophthalmology

## 2015-02-13 ENCOUNTER — Ambulatory Visit: Payer: Medicare Other | Admitting: Anesthesiology

## 2015-02-13 DIAGNOSIS — M1991 Primary osteoarthritis, unspecified site: Secondary | ICD-10-CM | POA: Insufficient documentation

## 2015-02-13 DIAGNOSIS — Z96653 Presence of artificial knee joint, bilateral: Secondary | ICD-10-CM | POA: Diagnosis not present

## 2015-02-13 DIAGNOSIS — Z9884 Bariatric surgery status: Secondary | ICD-10-CM | POA: Insufficient documentation

## 2015-02-13 DIAGNOSIS — Z888 Allergy status to other drugs, medicaments and biological substances status: Secondary | ICD-10-CM | POA: Insufficient documentation

## 2015-02-13 DIAGNOSIS — H2511 Age-related nuclear cataract, right eye: Secondary | ICD-10-CM | POA: Insufficient documentation

## 2015-02-13 DIAGNOSIS — J45909 Unspecified asthma, uncomplicated: Secondary | ICD-10-CM | POA: Diagnosis not present

## 2015-02-13 DIAGNOSIS — K219 Gastro-esophageal reflux disease without esophagitis: Secondary | ICD-10-CM | POA: Insufficient documentation

## 2015-02-13 DIAGNOSIS — Z79899 Other long term (current) drug therapy: Secondary | ICD-10-CM | POA: Insufficient documentation

## 2015-02-13 DIAGNOSIS — Z8701 Personal history of pneumonia (recurrent): Secondary | ICD-10-CM | POA: Insufficient documentation

## 2015-02-13 DIAGNOSIS — J449 Chronic obstructive pulmonary disease, unspecified: Secondary | ICD-10-CM | POA: Insufficient documentation

## 2015-02-13 DIAGNOSIS — E7800 Pure hypercholesterolemia, unspecified: Secondary | ICD-10-CM | POA: Insufficient documentation

## 2015-02-13 DIAGNOSIS — Z9049 Acquired absence of other specified parts of digestive tract: Secondary | ICD-10-CM | POA: Insufficient documentation

## 2015-02-13 DIAGNOSIS — Z885 Allergy status to narcotic agent status: Secondary | ICD-10-CM | POA: Diagnosis not present

## 2015-02-13 DIAGNOSIS — Z87891 Personal history of nicotine dependence: Secondary | ICD-10-CM | POA: Insufficient documentation

## 2015-02-13 DIAGNOSIS — G4730 Sleep apnea, unspecified: Secondary | ICD-10-CM | POA: Diagnosis not present

## 2015-02-13 DIAGNOSIS — Z886 Allergy status to analgesic agent status: Secondary | ICD-10-CM | POA: Diagnosis not present

## 2015-02-13 HISTORY — PX: CATARACT EXTRACTION W/PHACO: SHX586

## 2015-02-13 HISTORY — DX: Unspecified osteoarthritis, unspecified site: M19.90

## 2015-02-13 SURGERY — PHACOEMULSIFICATION, CATARACT, WITH IOL INSERTION
Anesthesia: Monitor Anesthesia Care | Laterality: Right | Wound class: Clean

## 2015-02-13 MED ORDER — MIDAZOLAM HCL 2 MG/2ML IJ SOLN
INTRAMUSCULAR | Status: DC | PRN
  Administered 2015-02-13: 2 mg via INTRAVENOUS

## 2015-02-13 MED ORDER — TETRACAINE HCL 0.5 % OP SOLN
1.0000 [drp] | OPHTHALMIC | Status: DC | PRN
  Administered 2015-02-13: 1 [drp] via OPHTHALMIC

## 2015-02-13 MED ORDER — ONDANSETRON HCL 4 MG PO TABS: 4.0000 mg | ORAL_TABLET | Freq: Once | ORAL | Status: DC

## 2015-02-13 MED ORDER — ACETAMINOPHEN 325 MG PO TABS: 325.0000 mg | ORAL_TABLET | ORAL | Status: DC | PRN

## 2015-02-13 MED ORDER — LIDOCAINE HCL (PF) 4 % IJ SOLN
INTRAOCULAR | Status: DC | PRN
  Administered 2015-02-13: 1 mL via OPHTHALMIC

## 2015-02-13 MED ORDER — CEFUROXIME OPHTHALMIC INJECTION 1 MG/0.1 ML
INJECTION | OPHTHALMIC | Status: DC | PRN
  Administered 2015-02-13: 0.1 mL via INTRACAMERAL

## 2015-02-13 MED ORDER — BRIMONIDINE TARTRATE 0.2 % OP SOLN
OPHTHALMIC | Status: DC | PRN
  Administered 2015-02-13: 1 [drp] via OPHTHALMIC

## 2015-02-13 MED ORDER — EPINEPHRINE HCL 1 MG/ML IJ SOLN
INTRAOCULAR | Status: DC | PRN
  Administered 2015-02-13: 80 mL via OPHTHALMIC

## 2015-02-13 MED ORDER — METOCLOPRAMIDE HCL 5 MG/ML IJ SOLN
10.0000 mg | Freq: Once | INTRAMUSCULAR | Status: AC
  Administered 2015-02-13: 10 mg via INTRAVENOUS

## 2015-02-13 MED ORDER — POVIDONE-IODINE 5 % OP SOLN
1.0000 "application " | OPHTHALMIC | Status: DC | PRN
  Administered 2015-02-13: 1 via OPHTHALMIC

## 2015-02-13 MED ORDER — FENTANYL CITRATE (PF) 100 MCG/2ML IJ SOLN
INTRAMUSCULAR | Status: DC | PRN
  Administered 2015-02-13: 50 ug via INTRAVENOUS

## 2015-02-13 MED ORDER — ONDANSETRON 4 MG PO TBDP
4.0000 mg | ORAL_TABLET | Freq: Once | ORAL | Status: AC
  Administered 2015-02-13: 4 mg via ORAL

## 2015-02-13 MED ORDER — ACETAMINOPHEN 160 MG/5ML PO SOLN: 325.0000 mg | ORAL | Status: DC | PRN

## 2015-02-13 MED ORDER — NA HYALUR & NA CHOND-NA HYALUR 0.4-0.35 ML IO KIT
PACK | INTRAOCULAR | Status: DC | PRN
  Administered 2015-02-13: 1 mL via INTRAOCULAR

## 2015-02-13 MED ORDER — ONDANSETRON HCL 4 MG/2ML IJ SOLN
4.0000 mg | Freq: Once | INTRAMUSCULAR | Status: AC
  Administered 2015-02-13: 4 mg via INTRAVENOUS

## 2015-02-13 MED ORDER — ARMC OPHTHALMIC DILATING GEL
1.0000 "application " | OPHTHALMIC | Status: DC | PRN
  Administered 2015-02-13 (×2): 1 via OPHTHALMIC

## 2015-02-13 MED ORDER — TIMOLOL MALEATE 0.5 % OP SOLN
OPHTHALMIC | Status: DC | PRN
  Administered 2015-02-13: 1 [drp] via OPHTHALMIC

## 2015-02-13 SURGICAL SUPPLY — 29 items
APPLICATOR COTTON TIP 3IN (MISCELLANEOUS) ×2 IMPLANT
CANNULA ANT/CHMB 27GA (MISCELLANEOUS) ×2 IMPLANT
DISSECTOR HYDRO NUCLEUS 50X22 (MISCELLANEOUS) ×2 IMPLANT
GLOVE BIO SURGEON STRL SZ7 (GLOVE) ×2 IMPLANT
GLOVE SURG LX 6.5 MICRO (GLOVE) ×1
GLOVE SURG LX STRL 6.5 MICRO (GLOVE) ×1 IMPLANT
GOWN STRL REUS W/ TWL LRG LVL3 (GOWN DISPOSABLE) ×2 IMPLANT
GOWN STRL REUS W/TWL LRG LVL3 (GOWN DISPOSABLE) ×2
LENS IOL ACRSF IQ PC 25.5 (Intraocular Lens) ×1 IMPLANT
LENS IOL ACRYSOF IQ POST 25.5 (Intraocular Lens) ×2 IMPLANT
MARKER SKIN SURG W/RULER VIO (MISCELLANEOUS) ×2 IMPLANT
NEEDLE FILTER BLUNT 18X 1/2SAF (NEEDLE) ×3
NEEDLE FILTER BLUNT 18X1 1/2 (NEEDLE) ×3 IMPLANT
PACK CATARACT BRASINGTON (MISCELLANEOUS) ×2 IMPLANT
PACK EYE AFTER SURG (MISCELLANEOUS) ×2 IMPLANT
PACK OPTHALMIC (MISCELLANEOUS) ×2 IMPLANT
RING MALYGIN 7.0 (MISCELLANEOUS) IMPLANT
SOL BAL SALT 15ML (MISCELLANEOUS)
SOLUTION BAL SALT 15ML (MISCELLANEOUS) IMPLANT
SUT ETHILON 10-0 CS-B-6CS-B-6 (SUTURE) ×2
SUT VICRYL  9 0 (SUTURE)
SUT VICRYL 9 0 (SUTURE) IMPLANT
SUTURE EHLN 10-0 CS-B-6CS-B-6 (SUTURE) ×1 IMPLANT
SYR 3ML LL SCALE MARK (SYRINGE) ×6 IMPLANT
SYR TB 1ML LUER SLIP (SYRINGE) ×2 IMPLANT
WATER STERILE IRR 250ML POUR (IV SOLUTION) ×2 IMPLANT
WATER STERILE IRR 500ML POUR (IV SOLUTION) IMPLANT
WICK EYE OCUCEL (MISCELLANEOUS) IMPLANT
WIPE NON LINTING 3.25X3.25 (MISCELLANEOUS) ×2 IMPLANT

## 2015-02-13 NOTE — H&P (Signed)
H+P reviewed and is up to date, please see paper chart.  

## 2015-02-13 NOTE — Op Note (Signed)
Date of Surgery: 02/13/2015  PREOPERATIVE DIAGNOSES: Visually significant nuclear sclerotic cataract, right eye.  POSTOPERATIVE DIAGNOSES: Same  PROCEDURES PERFORMED: Cataract extraction with intraocular lens implant, right eye.  SURGEON: Almon Hercules, M.D.  ANESTHESIA: MAC and topical  IMPLANTS: AcrySof IQ [id number] +25.5 D   Implant Name Type Inv. Item Serial No. Manufacturer Lot No. LRB No. Used  IMPLANT LENS - [id number] Intraocular Lens IMPLANT LENS [id number] ALCON   Right 1     COMPLICATIONS: Significant posterior pressure necessitating main incision suturing.  DESCRIPTION OF PROCEDURE: Therapeutic options were discussed with the patient preoperatively, including a discussion of risks and benefits of surgery. Informed consent was obtained. An IOL-Master and immersion biometry were used to take the lens measurements, and a dilated fundus exam was performed within 6 months of the surgical date.  The patient was premedicated and brought to the operating room and placed on the operating table in the supine position. After adequate anesthesia, the patient was prepped and draped in the usual sterile ophthalmic fashion. A wire lid speculum was inserted and the microscope was positioned. A Superblade was used to create a paracentesis site at the limbus and a small amount of dilute preservative free lidocaine was instilled into the anterior chamber, followed by dispersive viscoelastic. A clear corneal incision was created temporally using a 2.4 mm keratome blade. Capsulorrhexis was then performed. In situ phacoemulsification was performed.  Cortical material was removed with the irrigation-aspiration unit. Dispersive viscoelastic was instilled to open the capsular bag. A posterior chamber intraocular lens with the specifications above was inserted and positioned. Irrigation-aspiration was used to remove all viscoelastic. Cefuroxime 1cc was instilled into the anterior chamber, and the corneal  incision was checked and found to be water tight only after a 10-O nylon suture was placed. The eyelid speculum was removed.  The operative eye was covered with protective goggles after instilling 1 drop of timolol and brimonidine. The patient tolerated the procedure well. There were no complications.

## 2015-02-13 NOTE — Anesthesia Postprocedure Evaluation (Signed)
Anesthesia Post Note  Patient: Jenna [name]  Procedure(s) Performed: Procedure(s) (LRB): CATARACT EXTRACTION PHACO AND INTRAOCULAR LENS PLACEMENT (IOC) (Right)  Patient location during evaluation: PACU Anesthesia Type: MAC Level of consciousness: awake and alert Pain management: pain level controlled Vital Signs Assessment: post-procedure vital signs reviewed and stable Respiratory status: spontaneous breathing, nonlabored ventilation, respiratory function stable and patient connected to nasal cannula oxygen Cardiovascular status: blood pressure returned to baseline and stable Postop Assessment: no signs of nausea or vomiting Anesthetic complications: no    Amaryllis Dyke

## 2015-02-13 NOTE — Anesthesia Preprocedure Evaluation (Signed)
Anesthesia Evaluation  Patient identified by MRN, date of birth, ID band  Reviewed: Allergy & Precautions, NPO status , Patient's Chart, lab work & pertinent test results  Airway Mallampati: II  TM Distance: >3 FB   Mouth opening: Limited Mouth Opening  Dental   Pulmonary shortness of breath, asthma , sleep apnea , COPD, former smoker,    Pulmonary exam normal        Cardiovascular hypertension, Normal cardiovascular exam     Neuro/Psych PSYCHIATRIC DISORDERS Anxiety Depression    GI/Hepatic GERD  ,  Endo/Other  diabetes  Renal/GU      Musculoskeletal  (+) Arthritis ,   Abdominal   Peds  Hematology   Anesthesia Other Findings   Reproductive/Obstetrics                             Anesthesia Physical Anesthesia Plan  ASA: III  Anesthesia Plan: MAC   Post-op Pain Management:    Induction:   Airway Management Planned:   Additional Equipment:   Intra-op Plan:   Post-operative Plan:   Informed Consent: I have reviewed the patients History and Physical, chart, labs and discussed the procedure including the risks, benefits and alternatives for the proposed anesthesia with the patient or authorized representative who has indicated his/her understanding and acceptance.     Plan Discussed with: CRNA  Anesthesia Plan Comments:         Anesthesia Quick Evaluation

## 2015-02-13 NOTE — Transfer of Care (Signed)
Immediate Anesthesia Transfer of Care Note  Patient: Jenna [name]  Procedure(s) Performed: Procedure(s): CATARACT EXTRACTION PHACO AND INTRAOCULAR LENS PLACEMENT (IOC) (Right)  Patient Location: PACU  Anesthesia Type: MAC  Level of Consciousness: awake, alert  and patient cooperative  Airway and Oxygen Therapy: Patient Spontanous Breathing and Patient connected to supplemental oxygen  Post-op Assessment: Post-op Vital signs reviewed, Patient's Cardiovascular Status Stable, Respiratory Function Stable, Patent Airway and No signs of Nausea or vomiting  Post-op Vital Signs: Reviewed and stable  Complications: No apparent anesthesia complications

## 2015-02-13 NOTE — Anesthesia Procedure Notes (Signed)
Procedure Name: MAC Performed by: Zeba Luby Pre-anesthesia Checklist: Patient identified, Emergency Drugs available, Suction available, Timeout performed and Patient being monitored Patient Re-evaluated:Patient Re-evaluated prior to inductionOxygen Delivery Method: Nasal cannula Placement Confirmation: positive ETCO2       

## 2015-02-14 ENCOUNTER — Encounter: Payer: Self-pay | Admitting: Ophthalmology

## 2015-03-02 ENCOUNTER — Encounter: Payer: Self-pay | Admitting: *Deleted

## 2015-03-03 NOTE — Discharge Instructions (Signed)

## 2015-03-22 ENCOUNTER — Encounter: Payer: Self-pay | Admitting: Anesthesiology

## 2015-03-22 ENCOUNTER — Ambulatory Visit
Admission: RE | Admit: 2015-03-22 | Discharge: 2015-03-22 | Disposition: A | Discharge status: Home / Self Care | Payer: Medicare Other | Source: Ambulatory Visit | Attending: Ophthalmology | Admitting: Ophthalmology

## 2015-03-22 ENCOUNTER — Encounter
Admission: RE | Disposition: A | Discharge status: Home / Self Care | Payer: Self-pay | Source: Ambulatory Visit | Attending: Ophthalmology

## 2015-03-22 ENCOUNTER — Encounter: Payer: Self-pay | Admitting: *Deleted

## 2015-03-22 DIAGNOSIS — Z90710 Acquired absence of both cervix and uterus: Secondary | ICD-10-CM | POA: Insufficient documentation

## 2015-03-22 DIAGNOSIS — Z886 Allergy status to analgesic agent status: Secondary | ICD-10-CM | POA: Insufficient documentation

## 2015-03-22 DIAGNOSIS — J449 Chronic obstructive pulmonary disease, unspecified: Secondary | ICD-10-CM | POA: Insufficient documentation

## 2015-03-22 DIAGNOSIS — Z96653 Presence of artificial knee joint, bilateral: Secondary | ICD-10-CM | POA: Insufficient documentation

## 2015-03-22 DIAGNOSIS — Z9884 Bariatric surgery status: Secondary | ICD-10-CM | POA: Diagnosis not present

## 2015-03-22 DIAGNOSIS — J45909 Unspecified asthma, uncomplicated: Secondary | ICD-10-CM | POA: Diagnosis not present

## 2015-03-22 DIAGNOSIS — F329 Major depressive disorder, single episode, unspecified: Secondary | ICD-10-CM | POA: Insufficient documentation

## 2015-03-22 DIAGNOSIS — Z9049 Acquired absence of other specified parts of digestive tract: Secondary | ICD-10-CM | POA: Insufficient documentation

## 2015-03-22 DIAGNOSIS — H2512 Age-related nuclear cataract, left eye: Secondary | ICD-10-CM | POA: Insufficient documentation

## 2015-03-22 DIAGNOSIS — E7800 Pure hypercholesterolemia, unspecified: Secondary | ICD-10-CM | POA: Insufficient documentation

## 2015-03-22 DIAGNOSIS — M1990 Unspecified osteoarthritis, unspecified site: Secondary | ICD-10-CM | POA: Insufficient documentation

## 2015-03-22 DIAGNOSIS — Z8701 Personal history of pneumonia (recurrent): Secondary | ICD-10-CM | POA: Diagnosis not present

## 2015-03-22 DIAGNOSIS — Z885 Allergy status to narcotic agent status: Secondary | ICD-10-CM | POA: Diagnosis not present

## 2015-03-22 DIAGNOSIS — E119 Type 2 diabetes mellitus without complications: Secondary | ICD-10-CM | POA: Insufficient documentation

## 2015-03-22 DIAGNOSIS — Z79899 Other long term (current) drug therapy: Secondary | ICD-10-CM | POA: Insufficient documentation

## 2015-03-22 DIAGNOSIS — Z888 Allergy status to other drugs, medicaments and biological substances status: Secondary | ICD-10-CM | POA: Insufficient documentation

## 2015-03-22 DIAGNOSIS — Z9889 Other specified postprocedural states: Secondary | ICD-10-CM | POA: Insufficient documentation

## 2015-03-22 DIAGNOSIS — H269 Unspecified cataract: Secondary | ICD-10-CM | POA: Diagnosis present

## 2015-03-22 HISTORY — PX: CATARACT EXTRACTION W/PHACO: SHX586

## 2015-03-22 SURGERY — PHACOEMULSIFICATION, CATARACT, WITH IOL INSERTION
Anesthesia: Monitor Anesthesia Care | Site: Eye | Laterality: Left | Wound class: Clean

## 2015-03-22 MED ORDER — TIMOLOL MALEATE 0.5 % OP SOLN
OPHTHALMIC | Status: DC | PRN
  Administered 2015-03-22: 1 [drp] via OPHTHALMIC

## 2015-03-22 MED ORDER — NA HYALUR & NA CHOND-NA HYALUR 0.4-0.35 ML IO KIT
PACK | INTRAOCULAR | Status: DC | PRN
  Administered 2015-03-22: 1 mL via INTRAOCULAR

## 2015-03-22 MED ORDER — CEFUROXIME OPHTHALMIC INJECTION 1 MG/0.1 ML
INJECTION | OPHTHALMIC | Status: DC | PRN
  Administered 2015-03-22: .1 mL via INTRACAMERAL

## 2015-03-22 MED ORDER — ARMC OPHTHALMIC DILATING GEL
1.0000 "application " | OPHTHALMIC | Status: DC | PRN
  Administered 2015-03-22 (×2): 1 via OPHTHALMIC

## 2015-03-22 MED ORDER — BRIMONIDINE TARTRATE 0.2 % OP SOLN
OPHTHALMIC | Status: DC | PRN
  Administered 2015-03-22: 1 [drp] via OPHTHALMIC

## 2015-03-22 MED ORDER — LIDOCAINE HCL (PF) 4 % IJ SOLN
INTRAMUSCULAR | Status: DC | PRN
  Administered 2015-03-22: 4 mL via OPHTHALMIC

## 2015-03-22 MED ORDER — TETRACAINE HCL 0.5 % OP SOLN
1.0000 [drp] | OPHTHALMIC | Status: DC | PRN
  Administered 2015-03-22: 1 [drp] via OPHTHALMIC

## 2015-03-22 MED ORDER — FENTANYL CITRATE (PF) 100 MCG/2ML IJ SOLN
INTRAMUSCULAR | Status: DC | PRN
  Administered 2015-03-22: 50 ug via INTRAVENOUS

## 2015-03-22 MED ORDER — POVIDONE-IODINE 5 % OP SOLN
1.0000 "application " | OPHTHALMIC | Status: DC | PRN
  Administered 2015-03-22: 1 via OPHTHALMIC

## 2015-03-22 MED ORDER — MIDAZOLAM HCL 5 MG/5ML IJ SOLN
INTRAMUSCULAR | Status: DC | PRN
  Administered 2015-03-22: 2 mg via INTRAVENOUS

## 2015-03-22 MED ORDER — EPINEPHRINE HCL 1 MG/ML IJ SOLN
INTRAOCULAR | Status: DC | PRN
  Administered 2015-03-22: 68 mL via OPHTHALMIC

## 2015-03-22 SURGICAL SUPPLY — 30 items
APPLICATOR COTTON TIP 3IN (MISCELLANEOUS) ×2 IMPLANT
CANNULA ANT/CHMB 27GA (MISCELLANEOUS) ×2 IMPLANT
DISSECTOR HYDRO NUCLEUS 50X22 (MISCELLANEOUS) ×2 IMPLANT
GLOVE BIO SURGEON STRL SZ7 (GLOVE) ×2 IMPLANT
GLOVE SURG LX 6.5 MICRO (GLOVE) ×1
GLOVE SURG LX STRL 6.5 MICRO (GLOVE) ×1 IMPLANT
GOWN STRL REUS W/ TWL LRG LVL3 (GOWN DISPOSABLE) ×2 IMPLANT
GOWN STRL REUS W/TWL LRG LVL3 (GOWN DISPOSABLE) ×2
LENS IOL ACRSF IQ TRC 5 25.5 (Intraocular Lens) ×1 IMPLANT
LENS IOL ACRYSOF IQ TORIC 25.5 (Intraocular Lens) ×1 IMPLANT
LENS IOL IQ TORIC 5 25.5 (Intraocular Lens) ×1 IMPLANT
MARKER SKIN DUAL TIP RULER LAB (MISCELLANEOUS) ×2 IMPLANT
NEEDLE FILTER BLUNT 18X 1/2SAF (NEEDLE) ×1
NEEDLE FILTER BLUNT 18X1 1/2 (NEEDLE) ×1 IMPLANT
PACK CATARACT BRASINGTON (MISCELLANEOUS) ×2 IMPLANT
PACK EYE AFTER SURG (MISCELLANEOUS) ×2 IMPLANT
PACK OPTHALMIC (MISCELLANEOUS) ×2 IMPLANT
RING MALYGIN 7.0 (MISCELLANEOUS) IMPLANT
SOL BAL SALT 15ML (MISCELLANEOUS)
SOLUTION BAL SALT 15ML (MISCELLANEOUS) IMPLANT
SUT ETHILON 10-0 CS-B-6CS-B-6 (SUTURE)
SUT VICRYL  9 0 (SUTURE)
SUT VICRYL 9 0 (SUTURE) IMPLANT
SUTURE EHLN 10-0 CS-B-6CS-B-6 (SUTURE) IMPLANT
SYR 3ML LL SCALE MARK (SYRINGE) ×2 IMPLANT
SYR TB 1ML LUER SLIP (SYRINGE) ×2 IMPLANT
WATER STERILE IRR 250ML POUR (IV SOLUTION) ×2 IMPLANT
WATER STERILE IRR 500ML POUR (IV SOLUTION) IMPLANT
WICK EYE OCUCEL (MISCELLANEOUS) IMPLANT
WIPE NON LINTING 3.25X3.25 (MISCELLANEOUS) ×2 IMPLANT

## 2015-03-22 NOTE — Op Note (Signed)
Date of Surgery: 03/22/2015  PREOPERATIVE DIAGNOSES: Visually significant nuclear sclerotic cataract, left eye.  POSTOPERATIVE DIAGNOSES: Same  PROCEDURES PERFORMED: Cataract extraction with toric intraocular lens implant, left eye.  SURGEON: Almon Hercules, M.D.  ANESTHESIA: MAC and topical  IMPLANTS: AcrySof IQ toric [id number] 25.5 D.  Axis 164.  Implant Name Type Inv. Item Serial No. Manufacturer Lot No. LRB No. Used  alcon acri soft toric lens     [id number] ALCON   Left 1    COMPLICATIONS: None.  DESCRIPTION OF PROCEDURE: Therapeutic options were discussed with the patient preoperatively, including a discussion of risks and benefits of surgery. Informed consent was obtained. An IOL-Master and immersion biometry were used to take the lens measurements, and a dilated fundus exam was performed within 6 months of the surgical date.  The patient was premedicated and her cornea marked, and she was brought to the operating room and placed on the operating table in the supine position. After adequate anesthesia, the patient was prepped and draped in the usual sterile ophthalmic fashion. A wire lid speculum was inserted and the microscope was positioned. A Superblade was used to create a paracentesis site at the limbus and a small amount of dilute preservative free lidocaine was instilled into the anterior chamber, followed by dispersive viscoelastic. A clear corneal incision was created temporally using a 2.4 mm keratome blade. Capsulorrhexis was then performed. In situ phacoemulsification was performed.  Cortical material was removed with the irrigation-aspiration unit. Dispersive viscoelastic was instilled to open the capsular bag. A posterior chamber intraocular lens with the specifications above was inserted and positioned to the 164 degree axis. Irrigation-aspiration was used to remove all viscoelastic. Cefuroxime 1cc was instilled into the anterior chamber, and the corneal incision was checked  and found to be water tight. The eyelid speculum was removed.  The operative eye was covered with protective goggles after instilling 1 drop of timolol and brimonidine. The patient tolerated the procedure well. There were no complications.

## 2015-03-22 NOTE — Anesthesia Preprocedure Evaluation (Signed)
Anesthesia Evaluation  Patient identified by MRN, date of birth, ID band Patient awake    Reviewed: Allergy & Precautions, H&P , NPO status , Patient's Chart, lab work & pertinent test results, reviewed documented beta blocker date and time   Airway Mallampati: II  TM Distance: >3 FB Neck ROM: full  Mouth opening: Limited Mouth Opening  Dental no notable dental hx.    Pulmonary asthma , sleep apnea (resolved with gastric bypass) , COPD, former smoker,    Pulmonary exam normal breath sounds clear to auscultation       Cardiovascular Exercise Tolerance: Good hypertension, Normal cardiovascular exam Rhythm:regular Rate:Normal     Neuro/Psych PSYCHIATRIC DISORDERS negative neurological ROS  negative psych ROS   GI/Hepatic Neg liver ROS, GERD  ,  Endo/Other  diabetes (resolved with gastric bypass)  Renal/GU negative Renal ROS  negative genitourinary   Musculoskeletal   Abdominal   Peds  Hematology negative hematology ROS (+)   Anesthesia Other Findings   Reproductive/Obstetrics negative OB ROS                             Anesthesia Physical Anesthesia Plan  ASA: III  Anesthesia Plan: MAC   Post-op Pain Management:    Induction:   Airway Management Planned:   Additional Equipment:   Intra-op Plan:   Post-operative Plan:   Informed Consent: I have reviewed the patients History and Physical, chart, labs and discussed the procedure including the risks, benefits and alternatives for the proposed anesthesia with the patient or authorized representative who has indicated his/her understanding and acceptance.   Dental Advisory Given  Plan Discussed with: CRNA  Anesthesia Plan Comments:         Anesthesia Quick Evaluation

## 2015-03-22 NOTE — Anesthesia Procedure Notes (Signed)
Procedure Name: MAC Performed by: Franklin Baumbach Pre-anesthesia Checklist: Patient identified, Emergency Drugs available, Suction available, Timeout performed and Patient being monitored Patient Re-evaluated:Patient Re-evaluated prior to inductionOxygen Delivery Method: Nasal cannula Placement Confirmation: positive ETCO2     

## 2015-03-22 NOTE — Anesthesia Postprocedure Evaluation (Signed)
Anesthesia Post Note  Patient: Jenna [name]  Procedure(s) Performed: Procedure(s) (LRB): CATARACT EXTRACTION PHACO AND INTRAOCULAR LENS PLACEMENT (IOC) (Left)  Patient location during evaluation: PACU Anesthesia Type: MAC Level of consciousness: awake and alert Pain management: pain level controlled Vital Signs Assessment: post-procedure vital signs reviewed and stable Respiratory status: spontaneous breathing, nonlabored ventilation, respiratory function stable and patient connected to nasal cannula oxygen Cardiovascular status: blood pressure returned to baseline and stable Postop Assessment: no signs of nausea or vomiting Anesthetic complications: no    Alisa Graff

## 2015-03-22 NOTE — H&P (Signed)
H+P reviewed and is up to date, please see paper chart.  

## 2015-03-22 NOTE — Transfer of Care (Signed)
Immediate Anesthesia Transfer of Care Note  Patient: Jenna [name]  Procedure(s) Performed: Procedure(s) with comments: CATARACT EXTRACTION PHACO AND INTRAOCULAR LENS PLACEMENT ([place]) (Left) - TORIC  Patient Location: PACU  Anesthesia Type: MAC  Level of Consciousness: awake, alert  and patient cooperative  Airway and Oxygen Therapy: Patient Spontanous Breathing and Patient connected to supplemental oxygen  Post-op Assessment: Post-op Vital signs reviewed, Patient's Cardiovascular Status Stable, Respiratory Function Stable, Patent Airway and No signs of Nausea or vomiting  Post-op Vital Signs: Reviewed and stable  Complications: No apparent anesthesia complications

## 2015-03-23 ENCOUNTER — Encounter: Payer: Self-pay | Admitting: Ophthalmology

## 2015-06-22 ENCOUNTER — Encounter: Payer: Self-pay | Admitting: *Deleted

## 2015-06-23 NOTE — Discharge Instructions (Signed)
INSTRUCTIONS FOLLOWING OCULOPLASTIC SURGERY °AMY M. FOWLER, MD ° °AFTER YOUR EYE SURGERY, THER ARE MANY THINGS THWIHC YOU, THE PATIENT, CAN DO TO ASSURE THE BEST POSSIBLE RESULT FROM YOUR OPERATION.  THIS SHEET SHOULD BE REFERRED TO WHENEVER QUESTIONS ARISE.  IF THERE ARE ANY QUESTIONS NOT ANSWERED HERE, DO NOT HESITATE TO CALL OUR OFFICE AT [phone] OR [phone].  THERE IS ALWAYS [facility] AVAILABLE TO CALL IF QUESTIONS OR PROBLEMS ARISE. ° °VISION: Your vision may be blurred and out of focus after surgery until you are able to stop using your ointment, swelling resolves and your eye(s) heal. This may take 1 to 2 weeks at the least.  If your vision becomes gradually more dim or dark, this is not normal and you need to call our office immediately. ° °EYE CARE: For the first 48 hours after surgery, use ice packs frequently - “20 minutes on, 20 minutes off” - to help reduce swelling and bruising.  Small bags of frozen peas or corn make good ice packs along with cloths soaked in ice water.  If you are wearing a patch or other type of dressing following surgery, keep this on for the amount of time specified by your doctor.  For the first week following surgery, you will need to treat your stitches with great care.  If is OK to shower, but take care to not allow soapy water to run into your eye(s) to help reduce changes of infection.  You may gently clean the eyelashes and around the eye(s) with cotton balls and sterile water, BUT DO NOT RUB THE STITCHES VIGOROUSLY.  Keeping your stitches moist with ointment will help promote healing with minimal scar formation. ° °ACTIVITY: When you leave the surgery center, you should go home, rest and be inactive.  The eye(s) may feel scratchy and keeping the eyes closed will allow for faster healing.  The first week following surgery, avoid straining (anything making the face turn red) or lifting over 20 pounds.  Additionally, avoid bending which causes your head to go below  your waist.  Using your eyes will NOT harm them, so feel free to read, watch television, use the computer, etc as desired.  Driving depends on each individual, so check with your doctor if you have questions about driving. ° °MEDICATIONS:  You will be given a prescription for an ointment to use 4 times a day on your stitches.  You can use the ointment in your eyes if they feel scratchy or irritated.  If you eyelid(s) don’t close completely when you sleep, put some ointment in your eyes before bedtime. ° °EMERGENCY: If you experience SEVERE EYE PAIN OR HEADACHE UNRELIEVED BY TYLENOL OR PERCOCET, NAUSEA OR VOMITING, WORSENING REDNESS, OR WORSENING VISION (ESPECIALLY VISION THAT WA INITIALLY BETTER) CALL [phone] OR [phone] DURING BUSINESS HOURS OR AFTER HOURS. ° °General Anesthesia, Adult, Care After °Refer to this sheet in the next few weeks. These instructions provide you with information on caring for yourself after your procedure. Your health care provider may also give you more specific instructions. Your treatment has been planned according to current medical practices, but problems sometimes occur. Call your health care provider if you have any problems or questions after your procedure. °WHAT TO EXPECT AFTER THE PROCEDURE °After the procedure, it is typical to experience: °· Sleepiness. °· Nausea and vomiting. °HOME CARE INSTRUCTIONS °· For the first 24 hours after general anesthesia: °¨ Have a responsible person with you. °¨ Do not drive a car. If you   are alone, do not take public transportation. °¨ Do not drink alcohol. °¨ Do not take medicine that has not been prescribed by your health care provider. °¨ Do not sign important papers or make important decisions. °¨ You may resume a normal diet and activities as directed by your health care provider. °· Change bandages (dressings) as directed. °· If you have questions or problems that seem related to general anesthesia, call the hospital and ask for  the anesthetist or anesthesiologist on call. °SEEK MEDICAL CARE IF: °· You have nausea and vomiting that continue the day after anesthesia. °· You develop a rash. °SEEK IMMEDIATE MEDICAL CARE IF:  °· You have difficulty breathing. °· You have chest pain. °· You have any allergic problems. °  °This information is not intended to replace advice given to you by your health care provider. Make sure you discuss any questions you have with your health care provider. °  °Document Released: 04/15/2000 Document Revised: 01/28/2014 Document Reviewed: 05/08/2011 °Elsevier Interactive Patient Education ©2016 [employer] ° °

## 2015-06-27 ENCOUNTER — Ambulatory Visit
Admission: RE | Admit: 2015-06-27 | Discharge: 2015-06-27 | Disposition: A | Discharge status: Home / Self Care | Payer: Medicare Other | Source: Ambulatory Visit | Attending: Ophthalmology | Admitting: Ophthalmology

## 2015-06-27 ENCOUNTER — Ambulatory Visit: Payer: Medicare Other | Admitting: Anesthesiology

## 2015-06-27 ENCOUNTER — Encounter
Admission: RE | Disposition: A | Discharge status: Home / Self Care | Payer: Self-pay | Source: Ambulatory Visit | Attending: Ophthalmology

## 2015-06-27 DIAGNOSIS — I10 Essential (primary) hypertension: Secondary | ICD-10-CM | POA: Diagnosis not present

## 2015-06-27 DIAGNOSIS — Z9049 Acquired absence of other specified parts of digestive tract: Secondary | ICD-10-CM | POA: Insufficient documentation

## 2015-06-27 DIAGNOSIS — Z79899 Other long term (current) drug therapy: Secondary | ICD-10-CM | POA: Diagnosis not present

## 2015-06-27 DIAGNOSIS — H02834 Dermatochalasis of left upper eyelid: Secondary | ICD-10-CM | POA: Diagnosis not present

## 2015-06-27 DIAGNOSIS — H02403 Unspecified ptosis of bilateral eyelids: Secondary | ICD-10-CM | POA: Diagnosis present

## 2015-06-27 DIAGNOSIS — Z9842 Cataract extraction status, left eye: Secondary | ICD-10-CM | POA: Diagnosis not present

## 2015-06-27 DIAGNOSIS — Z885 Allergy status to narcotic agent status: Secondary | ICD-10-CM | POA: Diagnosis not present

## 2015-06-27 DIAGNOSIS — Z96653 Presence of artificial knee joint, bilateral: Secondary | ICD-10-CM | POA: Diagnosis not present

## 2015-06-27 DIAGNOSIS — J449 Chronic obstructive pulmonary disease, unspecified: Secondary | ICD-10-CM | POA: Insufficient documentation

## 2015-06-27 DIAGNOSIS — Z886 Allergy status to analgesic agent status: Secondary | ICD-10-CM | POA: Diagnosis not present

## 2015-06-27 DIAGNOSIS — H02831 Dermatochalasis of right upper eyelid: Secondary | ICD-10-CM | POA: Diagnosis not present

## 2015-06-27 DIAGNOSIS — K219 Gastro-esophageal reflux disease without esophagitis: Secondary | ICD-10-CM | POA: Insufficient documentation

## 2015-06-27 DIAGNOSIS — G4730 Sleep apnea, unspecified: Secondary | ICD-10-CM | POA: Insufficient documentation

## 2015-06-27 DIAGNOSIS — Z87891 Personal history of nicotine dependence: Secondary | ICD-10-CM | POA: Diagnosis not present

## 2015-06-27 DIAGNOSIS — Z90710 Acquired absence of both cervix and uterus: Secondary | ICD-10-CM | POA: Diagnosis not present

## 2015-06-27 DIAGNOSIS — Z888 Allergy status to other drugs, medicaments and biological substances status: Secondary | ICD-10-CM | POA: Diagnosis not present

## 2015-06-27 DIAGNOSIS — Z9884 Bariatric surgery status: Secondary | ICD-10-CM | POA: Insufficient documentation

## 2015-06-27 DIAGNOSIS — E119 Type 2 diabetes mellitus without complications: Secondary | ICD-10-CM | POA: Insufficient documentation

## 2015-06-27 DIAGNOSIS — M1990 Unspecified osteoarthritis, unspecified site: Secondary | ICD-10-CM | POA: Diagnosis not present

## 2015-06-27 DIAGNOSIS — E7800 Pure hypercholesterolemia, unspecified: Secondary | ICD-10-CM | POA: Diagnosis not present

## 2015-06-27 DIAGNOSIS — Z9841 Cataract extraction status, right eye: Secondary | ICD-10-CM | POA: Insufficient documentation

## 2015-06-27 HISTORY — PX: BROW LIFT: SHX178

## 2015-06-27 SURGERY — BLEPHAROPLASTY
Anesthesia: Monitor Anesthesia Care | Site: Eye | Laterality: Bilateral | Wound class: Clean

## 2015-06-27 MED ORDER — TETRACAINE HCL 0.5 % OP SOLN
OPHTHALMIC | Status: DC | PRN
  Administered 2015-06-27: 10 [drp] via OPHTHALMIC

## 2015-06-27 MED ORDER — ACETAMINOPHEN 160 MG/5ML PO SOLN: 325.0000 mg | ORAL | Status: DC | PRN

## 2015-06-27 MED ORDER — LIDOCAINE HCL (CARDIAC) 20 MG/ML IV SOLN
INTRAVENOUS | Status: DC | PRN
  Administered 2015-06-27: 40 mg via INTRAVENOUS

## 2015-06-27 MED ORDER — ONDANSETRON HCL 4 MG/2ML IJ SOLN
4.0000 mg | Freq: Once | INTRAMUSCULAR | Status: DC | PRN
Start: 2015-06-27 — End: 2015-06-27

## 2015-06-27 MED ORDER — ALFENTANIL 500 MCG/ML IJ INJ
INJECTION | INTRAMUSCULAR | Status: DC | PRN
  Administered 2015-06-27: 600 ug via INTRAVENOUS
  Administered 2015-06-27: 400 ug via INTRAVENOUS

## 2015-06-27 MED ORDER — LACTATED RINGERS IV SOLN
INTRAVENOUS | Status: DC
  Administered 2015-06-27 (×2): via INTRAVENOUS

## 2015-06-27 MED ORDER — BACITRACIN 500 UNIT/GM OP OINT: TOPICAL_OINTMENT | OPHTHALMIC | Status: DC

## 2015-06-27 MED ORDER — LIDOCAINE-EPINEPHRINE 2 %-1:100000 IJ SOLN
INTRAMUSCULAR | Status: DC | PRN
  Administered 2015-06-27: 3.5 mL via OPHTHALMIC

## 2015-06-27 MED ORDER — BSS IO SOLN
INTRAOCULAR | Status: DC | PRN
  Administered 2015-06-27: 15 mL

## 2015-06-27 MED ORDER — MIDAZOLAM HCL 2 MG/2ML IJ SOLN
INTRAMUSCULAR | Status: DC | PRN
  Administered 2015-06-27 (×2): 1 mg via INTRAVENOUS

## 2015-06-27 MED ORDER — ERYTHROMYCIN 5 MG/GM OP OINT
TOPICAL_OINTMENT | OPHTHALMIC | Status: DC | PRN
  Administered 2015-06-27: 1 via OPHTHALMIC

## 2015-06-27 MED ORDER — PROPOFOL 500 MG/50ML IV EMUL
INTRAVENOUS | Status: DC | PRN
  Administered 2015-06-27: 25 ug/kg/min via INTRAVENOUS

## 2015-06-27 MED ORDER — ACETAMINOPHEN 325 MG PO TABS: 325.0000 mg | ORAL_TABLET | ORAL | Status: DC | PRN

## 2015-06-27 SURGICAL SUPPLY — 36 items
APPLICATOR COTTON TIP WD 3 STR (MISCELLANEOUS) ×6 IMPLANT
BLADE SURG 15 STRL LF DISP TIS (BLADE) ×2 IMPLANT
BLADE SURG 15 STRL SS (BLADE) ×1
CORD BIP STRL DISP 12FT (MISCELLANEOUS) ×3 IMPLANT
DRAPE HEAD BAR (DRAPES) ×3 IMPLANT
GAUZE SPONGE 4X4 12PLY STRL (GAUZE/BANDAGES/DRESSINGS) ×3 IMPLANT
GAUZE SPONGE NON-WVN 2X2 STRL (MISCELLANEOUS) ×20 IMPLANT
GLOVE SURG LX 7.0 MICRO (GLOVE) ×2
GLOVE SURG LX STRL 7.0 MICRO (GLOVE) ×4 IMPLANT
MARKER SKIN XFINE TIP W/RULER (MISCELLANEOUS) ×3 IMPLANT
NEEDLE FILTER BLUNT 18X 1/2SAF (NEEDLE) ×1
NEEDLE FILTER BLUNT 18X1 1/2 (NEEDLE) ×2 IMPLANT
NEEDLE HYPO 30X.5 LL (NEEDLE) ×6 IMPLANT
PACK DRAPE NASAL/ENT (PACKS) ×3 IMPLANT
SOL PREP PVP 2OZ (MISCELLANEOUS) ×3
SOLUTION PREP PVP 2OZ (MISCELLANEOUS) ×2 IMPLANT
SPONGE VERSALON 2X2 STRL (MISCELLANEOUS) ×10
SUT CHROMIC 4-0 (SUTURE)
SUT CHROMIC 4-0 M2 12X2 ARM (SUTURE)
SUT CHROMIC 5 0 P 3 (SUTURE) IMPLANT
SUT ETHILON 4 0 CL P 3 (SUTURE) IMPLANT
SUT MERSILENE 4-0 S-2 (SUTURE) IMPLANT
SUT PDS AB 4-0 P3 18 (SUTURE) IMPLANT
SUT PLAIN GUT (SUTURE) ×3 IMPLANT
SUT PROLENE 5 0 P 3 (SUTURE) IMPLANT
SUT PROLENE 6 0 P 1 18 (SUTURE) ×6 IMPLANT
SUT SILK 4 0 G 3 (SUTURE) IMPLANT
SUT VIC AB 5-0 P-3 18X BRD (SUTURE) IMPLANT
SUT VIC AB 5-0 P3 18 (SUTURE)
SUT VICRYL 6-0  S14 CTD (SUTURE)
SUT VICRYL 6-0 S14 CTD (SUTURE) IMPLANT
SUT VICRYL 7 0 TG140 8 (SUTURE) ×3 IMPLANT
SUTURE CHRMC 4-0 M2 12X2 ARM (SUTURE) IMPLANT
SYR 3ML LL SCALE MARK (SYRINGE) ×3 IMPLANT
SYRINGE 10CC LL (SYRINGE) ×3 IMPLANT
WATER STERILE IRR 500ML POUR (IV SOLUTION) ×3 IMPLANT

## 2015-06-27 NOTE — H&P (Signed)
  See the history and physical completed at [facility] on 06/21/2015 and scanned into the chart.

## 2015-06-27 NOTE — Transfer of Care (Signed)
Immediate Anesthesia Transfer of Care Note  Patient: Jenna [name]  Procedure(s) Performed: Procedure(s) with comments: BLEPHAROPLASTY BILATERAL UPPER EYELIDS BILATERAL BLEPHAROTOSIS EYELIDS (Bilateral) - BILATERAL  Patient Location: PACU  Anesthesia Type: MAC  Level of Consciousness: awake, alert  and patient cooperative  Airway and Oxygen Therapy: Patient Spontanous Breathing and Patient connected to supplemental oxygen  Post-op Assessment: Post-op Vital signs reviewed, Patient's Cardiovascular Status Stable, Respiratory Function Stable, Patent Airway and No signs of Nausea or vomiting  Post-op Vital Signs: Reviewed and stable  Complications: No apparent anesthesia complications

## 2015-06-27 NOTE — Anesthesia Procedure Notes (Signed)
Procedure Name: MAC Performed by: Joab Carden Pre-anesthesia Checklist: Patient identified, Emergency Drugs available, Suction available, Patient being monitored and Timeout performed Patient Re-evaluated:Patient Re-evaluated prior to inductionOxygen Delivery Method: Nasal cannula       

## 2015-06-27 NOTE — Interval H&P Note (Signed)
History and Physical Interval Note:  [id number] AM  [place]  has presented today for surgery, with the diagnosis of DERMATOCHALASIS H02.831 RIGHT UPPER H02.834 LEFT UPPER PTOSIS OF EYELID H02.403  The various methods of treatment have been discussed with the patient and family. After consideration of risks, benefits and other options for treatment, the patient has consented to  Procedure(s): BLEPHAROPLASTY (Bilateral) PTOSIS REPAIR (Bilateral) as a surgical intervention .  The patient's history has been reviewed, patient examined, no change in status, stable for surgery.  I have reviewed the patient's chart and labs.  Questions were answered to the patient's satisfaction.     Vickki Muff, Tomeca Helm M

## 2015-06-27 NOTE — Anesthesia Postprocedure Evaluation (Signed)
Anesthesia Post Note  Patient: Jenna [name]  Procedure(s) Performed: Procedure(s) (LRB): BLEPHAROPLASTY BILATERAL UPPER EYELIDS BILATERAL BLEPHAROTOSIS EYELIDS (Bilateral)  Patient location during evaluation: PACU Anesthesia Type: MAC Level of consciousness: awake and alert Pain management: pain level controlled Vital Signs Assessment: post-procedure vital signs reviewed and stable Respiratory status: spontaneous breathing, nonlabored ventilation, respiratory function stable and patient connected to nasal cannula oxygen Cardiovascular status: blood pressure returned to baseline and stable Postop Assessment: no signs of nausea or vomiting Anesthetic complications: no    Alisa Graff

## 2015-06-27 NOTE — Op Note (Signed)
Preoperative Diagnosis:  1. Visually significant blepharoptosis both Upper Eyelid(s) 2. Visually significant dermatochalasis both Upper Eyelid(s)  Postoperative Diagnosis:  Same.  Procedure(s) Performed:   1. Blepharoptosis repair with levator aponeurosis advancement bilateral Upper Eyelid(s) 2. Upper eyelid blepharoplasty with excess skin excision  bilateral Upper Eyelid(s)  Surgeon: Philis Pique. Vickki Muff, M.D.  Assistants: none  Anesthesia: MAC  Specimens: None.  Estimated Blood Loss: Minimal.  Complications: None.  Operative Findings: None Dictated  Procedure:   Allergies were reviewed and the patient Codeine; Hydromorphone; Morphine; Nsaids; Oxycodone; Oxycodone-acetaminophen; Nabumetone; and Promethazine hcl.   After the risks, benefits, complications and alternatives were discussed with the patient, appropriate informed consent was obtained.  While seated in an upright position and looking in primary gaze, the mid pupillary line was marked on the upper eyelid margins bilaterally. The patient was then brought to the operating suite and reclined supine.  Timeout was conducted and the patient was sedated.  Local anesthetic consisting of a 50-50 mixture of 2% lidocaine with epinephrine and 0.75% bupivacaine with added Hylenex was injected subcutaneously to both upper eyelid(s). After adequate local was instilled, the patient was prepped and draped in the usual sterile fashion for eyelid surgery.   Attention was turned to the upper eyelids. A 9 mm upper eyelid crease incision line was marked with calipers on both upper eyelid(s).  A pinch test was used to estimate the amount of excess skin to remove and this was marked in standard blepharoplasty style fashion. Attention was turned to the  right upper eyelid. A #15 blade was used to open the premarked incision line. A skin and muscle flap was excised and hemostasis was obtained with bipolar cautery.   A buttonhole was created medially in  orbital septum to reveal the medial fat pocket. This was dissected free from fascial attachments, cauterized towards the pedicle base and excised to produce a nice flattening of the medial corner of the upper eyelid.   Westcott scissors were then used to transect through orbicularis down to the tarsal plate. Epitarsus was dissected to create a smooth surface to suture to. Dissection was then carried superiorly in the plane between orbicularis and orbital septum. Once the preaponeurotic fat pocket was identified, the orbital septum was opened. This revealed the levator and its aponeurosis.  A portion of the preaponeurotic fat pocket was cauterized towards the pedicle base and excised to help debulk the eyelid.  Attention was then turned to the opposite eyelid where the same procedure was performed in the same manner. Hemostasis was obtained with bipolar cautery throughout.   3 interrupted 6-0 Prolene sutures were then passed partial thickness through the tarsal plates of the right and 2 sutures in the left upper eyelid(s). These sutures were placed in line with the mid pupillary, medial limbal, and lateral limbal lines. The sutures were fixed to the levator aponeurosis and adjusted until a nice lid height and contour were achieved. Once nice symmetry was achieved, the skin incisions were closed with a running 6-0 fast absorbing plain suture. In the tail of the right upper eyelid incision line, 3 interrupted 7-0 Vicryl sutures were used for additional reduction of tension across the wound margin .  The patient tolerated the procedure well.  Erythromycin ophthalmic ointment was applied to the incision site(s) followed by ice packs. The patient was taken to the recovery area where she recovered without difficulty.  Post-Op Plan/Instructions:  The patient was instructed to use ice packs frequently for the next 48 hours. She was  instructed to use bacitracin ophthalmic ointment on her incisions 4 times a day for  the next 12 to 14 days. Shewas given a prescription for Percocet for pain control should Tylenol not be effective. She was asked to to follow up at the [facility] in [place], [place] in 2 weeks' time or sooner as needed for problems.  Doren Kaspar M. Vickki Muff, M.D. Attending,Ophthalmology

## 2015-06-27 NOTE — Anesthesia Preprocedure Evaluation (Signed)
Anesthesia Evaluation  Patient identified by MRN, date of birth, ID band Patient awake    Reviewed: Allergy & Precautions, H&P , NPO status , Patient's Chart, lab work & pertinent test results, reviewed documented beta blocker date and time   Airway Mallampati: II  TM Distance: >3 FB Neck ROM: full  Mouth opening: Limited Mouth Opening  Dental no notable dental hx.    Pulmonary asthma , sleep apnea (resolved with gastric bypass) , COPD, former smoker,    Pulmonary exam normal breath sounds clear to auscultation       Cardiovascular Exercise Tolerance: Good hypertension, Normal cardiovascular exam Rhythm:regular Rate:Normal     Neuro/Psych PSYCHIATRIC DISORDERS negative neurological ROS  negative psych ROS   GI/Hepatic Neg liver ROS, GERD  ,  Endo/Other  diabetes  Renal/GU negative Renal ROS  negative genitourinary   Musculoskeletal   Abdominal   Peds  Hematology negative hematology ROS (+)   Anesthesia Other Findings   Reproductive/Obstetrics negative OB ROS                             Anesthesia Physical  Anesthesia Plan  ASA: III  Anesthesia Plan: MAC   Post-op Pain Management:    Induction:   Airway Management Planned:   Additional Equipment:   Intra-op Plan:   Post-operative Plan:   Informed Consent: I have reviewed the patients History and Physical, chart, labs and discussed the procedure including the risks, benefits and alternatives for the proposed anesthesia with the patient or authorized representative who has indicated his/her understanding and acceptance.   Dental Advisory Given  Plan Discussed with: CRNA  Anesthesia Plan Comments:         Anesthesia Quick Evaluation

## 2015-06-28 ENCOUNTER — Encounter: Payer: Self-pay | Admitting: Ophthalmology

## 2015-09-20 ENCOUNTER — Emergency Department
Admission: EM | Admit: 2015-09-20 | Discharge: 2015-09-21 | Disposition: A | Discharge status: Home / Self Care | Payer: Medicare Other | Source: Home / Self Care | Attending: Emergency Medicine | Admitting: Emergency Medicine

## 2015-09-20 ENCOUNTER — Emergency Department: Payer: Medicare Other

## 2015-09-20 DIAGNOSIS — I10 Essential (primary) hypertension: Secondary | ICD-10-CM | POA: Insufficient documentation

## 2015-09-20 DIAGNOSIS — J449 Chronic obstructive pulmonary disease, unspecified: Secondary | ICD-10-CM

## 2015-09-20 DIAGNOSIS — Z87891 Personal history of nicotine dependence: Secondary | ICD-10-CM | POA: Insufficient documentation

## 2015-09-20 DIAGNOSIS — Z79899 Other long term (current) drug therapy: Secondary | ICD-10-CM | POA: Insufficient documentation

## 2015-09-20 DIAGNOSIS — R0789 Other chest pain: Secondary | ICD-10-CM | POA: Insufficient documentation

## 2015-09-20 DIAGNOSIS — J45909 Unspecified asthma, uncomplicated: Secondary | ICD-10-CM | POA: Insufficient documentation

## 2015-09-20 DIAGNOSIS — R079 Chest pain, unspecified: Secondary | ICD-10-CM

## 2015-09-20 DIAGNOSIS — K7200 Acute and subacute hepatic failure without coma: Secondary | ICD-10-CM | POA: Diagnosis not present

## 2015-09-20 DIAGNOSIS — E119 Type 2 diabetes mellitus without complications: Secondary | ICD-10-CM | POA: Insufficient documentation

## 2015-09-20 LAB — BASIC METABOLIC PANEL
Anion gap: 3 — ABNORMAL LOW (ref 5–15)
BUN: 16 mg/dL (ref 6–20)
CO2: 32 mmol/L (ref 22–32)
Calcium: 9.1 mg/dL (ref 8.9–10.3)
Chloride: 104 mmol/L (ref 101–111)
Creatinine, Ser: 0.97 mg/dL (ref 0.44–1.00)
GFR calc Af Amer: >60 mL/min (ref >60)
GFR calc non Af Amer: 55 mL/min — ABNORMAL LOW (ref >60)
Glucose, Bld: 126 mg/dL — ABNORMAL HIGH (ref 65–99)
Potassium: 4.1 mmol/L (ref 3.5–5.1)
Sodium: 139 mmol/L (ref 135–145)

## 2015-09-20 LAB — TROPONIN I: Troponin I: <0.03 ng/mL (ref <0.03)

## 2015-09-20 LAB — CBC WITH DIFFERENTIAL/PLATELET
Basophils Absolute: 0 10*3/uL (ref 0–0.1)
Basophils Relative: 1 %
Eosinophils Absolute: 0.1 10*3/uL (ref 0–0.7)
Eosinophils Relative: 2 %
HCT: 42.2 % (ref 35.0–47.0)
Hemoglobin: 14.3 g/dL (ref 12.0–16.0)
Lymphocytes Relative: 18 %
Lymphs Abs: 1.5 10*3/uL (ref 1.0–3.6)
MCH: 32.8 pg (ref 26.0–34.0)
MCHC: 34 g/dL (ref 32.0–36.0)
MCV: 96.6 fL (ref 80.0–100.0)
Monocytes Absolute: 0.5 10*3/uL (ref 0.2–0.9)
Monocytes Relative: 6 %
Neutro Abs: 6.2 10*3/uL (ref 1.4–6.5)
Neutrophils Relative %: 73 %
Platelets: 203 10*3/uL (ref 150–440)
RBC: 4.37 MIL/uL (ref 3.80–5.20)
RDW: 13 % (ref 11.5–14.5)
WBC: 8.4 10*3/uL (ref 3.6–11.0)

## 2015-09-20 NOTE — ED Provider Notes (Signed)
[facility] Emergency Department Provider Note   ____________________________________________   I have reviewed the triage vital signs and the nursing notes.   HISTORY  Chief Complaint Chest Pain   History limited by: Not Limited   HPI Jenna [name] is a 80 y.o. female who presents to the emergency department today because of concerns for chest pain. It was located in the center chest. She describes tightness. It started this afternoon when she was sitting in a recliner. Last roughly 30 minutes. It then went away. It then started again. She called the ambulance the second time. She did not have any associated shortness of breath. The pain did not radiate up into her neck or arms. No diaphoresis or sweating. The pain did resolve on its own during the transport to the hospital. Patient denies similar pain in the past. Denies any recent fevers.    Past Medical History:  Diagnosis Date  . Arthritis    neck, knees(before replacements)  . Asthma   . COPD (chronic obstructive pulmonary disease) ([place])   . Depression   . Diabetes mellitus without complication ([place])    resolved with gastric bypass  . Encounter for colonoscopy due to history of adenomatous colonic polyps   . GERD (gastroesophageal reflux disease)   . Hearing loss of both ears   . Hyperlipidemia   . Hypertension   . Palpitations   . Sleep apnea    resolved with gastric bypass  . Syncope and collapse     Patient Active Problem List   Diagnosis Date Noted  . Acute on chronic respiratory failure with hypoxemia ([place]) 06/13/2014  . Pericardial effusion 11/13/2013  . Morbid obesity ([place]) 11/13/2013  . Hyperlipidemia 11/13/2013  . Esophageal reflux 11/13/2013  . Bilateral leg edema 11/13/2013  . SOB (shortness of breath) 11/13/2013  . Chronic cough 11/13/2013    Past Surgical History:  Procedure Laterality Date  . BROW LIFT Bilateral 06/27/2015   Procedure: BLEPHAROPLASTY BILATERAL UPPER  EYELIDS BILATERAL BLEPHAROTOSIS EYELIDS;  Surgeon: Karle Starch, MD;  Location: [place];  Service: Ophthalmology;  Laterality: Bilateral;  BILATERAL  . CATARACT EXTRACTION W/PHACO Right 02/13/2015   Procedure: CATARACT EXTRACTION PHACO AND INTRAOCULAR LENS PLACEMENT (IOC);  Surgeon: Ronnell Freshwater, MD;  Location: [place];  Service: Ophthalmology;  Laterality: Right;  . CATARACT EXTRACTION W/PHACO Left 03/22/2015   Procedure: CATARACT EXTRACTION PHACO AND INTRAOCULAR LENS PLACEMENT (IOC);  Surgeon: Ronnell Freshwater, MD;  Location: [place];  Service: Ophthalmology;  Laterality: Left;  TORIC  . CHOLECYSTECTOMY  1984  . COLECTOMY    . COSMETIC SURGERY  2012   tummy tuck and excess skin removal  . GASTRIC BYPASS  2010  . REPLACEMENT TOTAL KNEE BILATERAL  1998  . SHOULDER SURGERY  2009   left   . TONSILLECTOMY    . TOTAL ABDOMINAL HYSTERECTOMY  1979    Prior to Admission medications   Medication Sig Start Date End Date Taking? Authorizing Provider  acetaminophen (TYLENOL) 500 MG tablet Take 500 mg by mouth as needed.    Historical Provider, MD  albuterol (PROVENTIL HFA;VENTOLIN HFA) 108 (90 BASE) MCG/ACT inhaler Inhale 2 puffs into the lungs every 6 (six) hours as needed for wheezing or shortness of breath. 06/14/14   Demetrios Loll, MD  amoxicillin (AMOXIL) 500 MG capsule Take 500 mg by mouth as needed (4 caps 1 hour prior to dental appt). Reported on 02/13/2015    Historical Provider, MD  Ascorbic Acid (  VITAMIN C) 1000 MG tablet Take 500 mg by mouth daily.     Historical Provider, MD  bacitracin ophthalmic ointment Use on sutures 4 times a day for 12-14 days 06/27/15   Karle Starch, MD  Cholecalciferol (VITAMIN D-1000 MAX ST) 1000 UNITS tablet Take 1,000 Units by mouth daily.     Historical Provider, MD  citalopram (CELEXA) 40 MG tablet Take 40 mg by mouth daily.     Historical Provider, MD  Cyanocobalamin (RA VITAMIN B-12 TR) 1000 MCG TBCR Take 1,000  mcg by mouth daily.     Historical Provider, MD  docusate sodium (COLACE) 250 MG capsule Take 250 mg by mouth as needed for constipation.    Historical Provider, MD  ferrous sulfate 325 (65 FE) MG EC tablet Take by mouth.    Historical Provider, MD  Fluticasone-Salmeterol (ADVAIR DISKUS) 250-50 MCG/DOSE AEPB Inhale 1 puff into the lungs 2 (two) times daily. Patient not taking: Reported on 03/22/2015 06/14/14   Demetrios Loll, MD  furosemide (LASIX) 20 MG tablet TAKE ONE TABLET BY MOUTH ONCE DAILY 09/28/13   Historical Provider, MD  lisinopril (PRINIVIL,ZESTRIL) 40 MG tablet Reported on 06/27/2015 08/03/13   Historical Provider, MD  Omega-3 Fatty Acids (FISH OIL) 1000 MG CAPS Take 1,000 mg by mouth daily.    Historical Provider, MD  Pediatric Multiple Vit-C-FA (CHEWABLE VITE CHILDRENS) CHEW Chew by mouth daily.     Historical Provider, MD  predniSONE (DELTASONE) 10 MG tablet Take 1 tablet (10 mg total) by mouth daily with breakfast. Patient not taking: Reported on 02/13/2015 06/14/14   Demetrios Loll, MD  ranitidine (ZANTAC) 150 MG tablet Take 150 mg by mouth 2 (two) times daily.  06/29/13   Historical Provider, MD  simvastatin (ZOCOR) 40 MG tablet Take 40 mg by mouth daily at 6 PM.     Historical Provider, MD    Allergies Codeine; Hydromorphone; Morphine; Nsaids; Oxycodone; Oxycodone-acetaminophen; Nabumetone; and Promethazine hcl  Family History  Problem Relation Age of Onset  . Heart attack Mother   . Hypertension Mother   . Heart attack Brother   . Heart attack Sister 32  . Hyperlipidemia Sister   . Hypertension Sister     Social History Social History  Substance Use Topics  . Smoking status: Former Smoker    Packs/day: 0.25    Years: 15.00    Types: Cigarettes  . Smokeless tobacco: Never Used  . Alcohol use No    Review of Systems  Constitutional: Negative for fever. Cardiovascular: Positive for chest pain. Respiratory: Negative for shortness of breath. Gastrointestinal: Negative for  abdominal pain, vomiting and diarrhea. Neurological: Negative for headaches, focal weakness or numbness.  10-point ROS otherwise negative.  ____________________________________________   PHYSICAL EXAM:  VITAL SIGNS: ED Triage Vitals  Enc Vitals Group     BP 09/20/15 2112 134/65     Pulse Rate 09/20/15 2112 62     Resp 09/20/15 2112 17     Temp 09/20/15 2112 98 F (36.7 C)     Temp Source 09/20/15 2112 Oral     SpO2 09/20/15 2047 95 %     Weight 09/20/15 2113 205 lb (93 kg)     Height 09/20/15 2113 5\' 3"  (1.6 m)   Constitutional: Alert and oriented. Well appearing and in no distress. Eyes: Conjunctivae are normal. Normal extraocular movements. ENT   Head: Normocephalic and atraumatic.   Nose: No congestion/rhinnorhea.   Mouth/Throat: Mucous membranes are moist.   Neck: No stridor. Hematological/Lymphatic/Immunilogical: No cervical  lymphadenopathy. Cardiovascular: Normal rate, regular rhythm.  No murmurs, rubs, or gallops. Respiratory: Normal respiratory effort without tachypnea nor retractions. Breath sounds are clear and equal bilaterally. No wheezes/rales/rhonchi. Gastrointestinal: Soft and nontender. No distention.  Genitourinary: Deferred Musculoskeletal: Normal range of motion in all extremities. No lower extremity edema. Neurologic:  Normal speech and language. No gross focal neurologic deficits are appreciated.  Skin:  Skin is warm, dry and intact. No rash noted. Psychiatric: Mood and affect are normal. Speech and behavior are normal. Patient exhibits appropriate insight and judgment.  ____________________________________________    LABS (pertinent positives/negatives)  Labs Reviewed  BASIC METABOLIC PANEL - Abnormal; Notable for the following:       Result Value   Glucose, Bld 126 (*)    GFR calc non Af Amer 55 (*)    Anion gap 3 (*)    All other components within normal limits  CBC WITH DIFFERENTIAL/PLATELET  TROPONIN I  TROPONIN I  2nd  troponin pending at time of sign out   ____________________________________________   EKG  Apolonio Schneiders, attending physician, personally viewed and interpreted this EKG  EKG Time: 2125 Rate: 58 Rhythm: sinus bradycardia Axis: normal Intervals: qtc 416 QRS: narrow ST changes: no st elevation Impression: abnormal ekg   ____________________________________________    RADIOLOGY  CXR IMPRESSION:  No active disease.       ___________________________________________   PROCEDURES  Procedures  ____________________________________________   INITIAL IMPRESSION / ASSESSMENT AND PLAN / ED COURSE  Pertinent labs & imaging results that were available during my care of the patient were reviewed by me and considered in my medical decision making (see chart for details).  Patient presented to the emergency department today after 2 episodes of chest pain. Will check blood work, chest x-ray. EKG without any ST elevation.  Clinical Course   First troponin negative. Will order 2nd troponin.  ____________________________________________   FINAL CLINICAL IMPRESSION(S) / ED DIAGNOSES  Final diagnoses:  Chest pain, unspecified chest pain type     Note: This dictation was prepared with Dragon dictation. Any transcriptional errors that result from this process are unintentional    Nance Pear, MD 09/20/15 2348

## 2015-09-20 NOTE — ED Triage Notes (Addendum)
Pt to ED via [facility] from home c/o chest pain. Per EMS pt stated she had centralized chest pain radiating to her back. Pt states familial history (mother) of dissecting aorta. Pt states she got SOB and nauseous when the chest pain first started, and that it happened twice this evening a few hours apart. Pt alert and oriented in no acute distress at this time.

## 2015-09-21 LAB — TROPONIN I: Troponin I: <0.03 ng/mL (ref <0.03)

## 2015-09-21 MED ORDER — RANITIDINE HCL 150 MG PO TABS: 150.0000 mg | ORAL_TABLET | Freq: Every day | ORAL | 1 refills | Status: DC

## 2015-09-21 NOTE — ED Provider Notes (Signed)
-----------------------------------------   2:49 AM on 09/21/2015 -----------------------------------------   Blood pressure (!) 114/55, pulse 74, temperature 98 F (36.7 C), temperature source Oral, resp. rate 19, height 5\' 3"  (1.6 m), weight 205 lb (93 kg), SpO2 98 %.  Assuming care from Dr. Archie Balboa.  In short, Jenna [name] is a 80 y.o. female with a chief complaint of Chest Pain .  Refer to the original H&P for additional details.  The current plan of care is to follow the patient's troponin with a repeat troponin of less than 0.03 we felt we could discharge the patient at this time. She should follow up with her primary physician and/or cardiology for any outpatient testing that would need to be performed. Patient will be prescribed ranitidine at the suggestion of Dr. Archie Balboa for esophageal reflux disease.    Daymon Larsen, MD 09/21/15 [phone]

## 2015-09-21 NOTE — ED Notes (Signed)
Discharge instructions reviewed with patient. Questions fielded by this RN. Patient verbalizes understanding of instructions. Patient discharged home in stable condition per Marcelene Butte MD . No acute distress noted at time of discharge.

## 2015-09-22 ENCOUNTER — Emergency Department: Payer: Medicare Other

## 2015-09-22 ENCOUNTER — Inpatient Hospital Stay
Admission: EM | Admit: 2015-09-22 | Discharge: 2015-09-23 | DRG: 442 | Disposition: A | Discharge status: Home / Self Care | Payer: Medicare Other | Attending: Internal Medicine | Admitting: Internal Medicine

## 2015-09-22 ENCOUNTER — Encounter: Payer: Self-pay | Admitting: Emergency Medicine

## 2015-09-22 DIAGNOSIS — Z96653 Presence of artificial knee joint, bilateral: Secondary | ICD-10-CM | POA: Diagnosis present

## 2015-09-22 DIAGNOSIS — Z885 Allergy status to narcotic agent status: Secondary | ICD-10-CM

## 2015-09-22 DIAGNOSIS — Z8249 Family history of ischemic heart disease and other diseases of the circulatory system: Secondary | ICD-10-CM | POA: Diagnosis not present

## 2015-09-22 DIAGNOSIS — Z888 Allergy status to other drugs, medicaments and biological substances status: Secondary | ICD-10-CM

## 2015-09-22 DIAGNOSIS — N390 Urinary tract infection, site not specified: Secondary | ICD-10-CM | POA: Diagnosis present

## 2015-09-22 DIAGNOSIS — K219 Gastro-esophageal reflux disease without esophagitis: Secondary | ICD-10-CM | POA: Diagnosis present

## 2015-09-22 DIAGNOSIS — J449 Chronic obstructive pulmonary disease, unspecified: Secondary | ICD-10-CM | POA: Diagnosis present

## 2015-09-22 DIAGNOSIS — Z90710 Acquired absence of both cervix and uterus: Secondary | ICD-10-CM

## 2015-09-22 DIAGNOSIS — B179 Acute viral hepatitis, unspecified: Secondary | ICD-10-CM

## 2015-09-22 DIAGNOSIS — M1990 Unspecified osteoarthritis, unspecified site: Secondary | ICD-10-CM | POA: Diagnosis present

## 2015-09-22 DIAGNOSIS — G4730 Sleep apnea, unspecified: Secondary | ICD-10-CM | POA: Diagnosis present

## 2015-09-22 DIAGNOSIS — Z87891 Personal history of nicotine dependence: Secondary | ICD-10-CM

## 2015-09-22 DIAGNOSIS — H9193 Unspecified hearing loss, bilateral: Secondary | ICD-10-CM | POA: Diagnosis present

## 2015-09-22 DIAGNOSIS — Z9884 Bariatric surgery status: Secondary | ICD-10-CM

## 2015-09-22 DIAGNOSIS — Z86010 Personal history of colonic polyps: Secondary | ICD-10-CM | POA: Diagnosis not present

## 2015-09-22 DIAGNOSIS — Z9889 Other specified postprocedural states: Secondary | ICD-10-CM | POA: Diagnosis not present

## 2015-09-22 DIAGNOSIS — E119 Type 2 diabetes mellitus without complications: Secondary | ICD-10-CM | POA: Diagnosis present

## 2015-09-22 DIAGNOSIS — K8050 Calculus of bile duct without cholangitis or cholecystitis without obstruction: Secondary | ICD-10-CM | POA: Diagnosis present

## 2015-09-22 DIAGNOSIS — E785 Hyperlipidemia, unspecified: Secondary | ICD-10-CM | POA: Diagnosis present

## 2015-09-22 DIAGNOSIS — Z9842 Cataract extraction status, left eye: Secondary | ICD-10-CM

## 2015-09-22 DIAGNOSIS — Z961 Presence of intraocular lens: Secondary | ICD-10-CM | POA: Diagnosis present

## 2015-09-22 DIAGNOSIS — K7200 Acute and subacute hepatic failure without coma: Secondary | ICD-10-CM | POA: Diagnosis present

## 2015-09-22 DIAGNOSIS — Z9049 Acquired absence of other specified parts of digestive tract: Secondary | ICD-10-CM

## 2015-09-22 DIAGNOSIS — Z886 Allergy status to analgesic agent status: Secondary | ICD-10-CM

## 2015-09-22 DIAGNOSIS — Z79899 Other long term (current) drug therapy: Secondary | ICD-10-CM | POA: Diagnosis not present

## 2015-09-22 DIAGNOSIS — Z9841 Cataract extraction status, right eye: Secondary | ICD-10-CM

## 2015-09-22 DIAGNOSIS — I10 Essential (primary) hypertension: Secondary | ICD-10-CM | POA: Diagnosis present

## 2015-09-22 LAB — URINALYSIS COMPLETE WITH MICROSCOPIC (ARMC ONLY)
Bacteria, UA: NONE SEEN
Glucose, UA: NEGATIVE mg/dL
Hgb urine dipstick: NEGATIVE
Nitrite: NEGATIVE
Protein, ur: 30 mg/dL — AB
Specific Gravity, Urine: 1.025 (ref 1.005–1.030)
pH: 5 (ref 5.0–8.0)

## 2015-09-22 LAB — COMPREHENSIVE METABOLIC PANEL
ALT: 591 U/L — ABNORMAL HIGH (ref 14–54)
AST: 464 U/L — ABNORMAL HIGH (ref 15–41)
Albumin: 3.3 g/dL — ABNORMAL LOW (ref 3.5–5.0)
Alkaline Phosphatase: 184 U/L — ABNORMAL HIGH (ref 38–126)
Anion gap: 8 (ref 5–15)
BUN: 17 mg/dL (ref 6–20)
CO2: 24 mmol/L (ref 22–32)
Calcium: 8.7 mg/dL — ABNORMAL LOW (ref 8.9–10.3)
Chloride: 104 mmol/L (ref 101–111)
Creatinine, Ser: 0.99 mg/dL (ref 0.44–1.00)
GFR calc Af Amer: >60 mL/min (ref >60)
GFR calc non Af Amer: 53 mL/min — ABNORMAL LOW (ref >60)
Glucose, Bld: 132 mg/dL — ABNORMAL HIGH (ref 65–99)
Potassium: 4.3 mmol/L (ref 3.5–5.1)
Sodium: 136 mmol/L (ref 135–145)
Total Bilirubin: 3.8 mg/dL — ABNORMAL HIGH (ref 0.3–1.2)
Total Protein: 5.8 g/dL — ABNORMAL LOW (ref 6.5–8.1)

## 2015-09-22 LAB — LIPASE, BLOOD: Lipase: 16 U/L (ref 11–51)

## 2015-09-22 LAB — CBC
HCT: 37.8 % (ref 35.0–47.0)
Hemoglobin: 12.9 g/dL (ref 12.0–16.0)
MCH: 33 pg (ref 26.0–34.0)
MCHC: 34.1 g/dL (ref 32.0–36.0)
MCV: 97 fL (ref 80.0–100.0)
Platelets: 135 10*3/uL — ABNORMAL LOW (ref 150–440)
RBC: 3.89 MIL/uL (ref 3.80–5.20)
RDW: 12.9 % (ref 11.5–14.5)
WBC: 9.7 10*3/uL (ref 3.6–11.0)

## 2015-09-22 LAB — LACTIC ACID, PLASMA: Lactic Acid, Venous: 1.1 mmol/L (ref 0.5–1.9)

## 2015-09-22 LAB — ACETAMINOPHEN LEVEL: Acetaminophen (Tylenol), Serum: <10 ug/mL — ABNORMAL LOW (ref 10–30)

## 2015-09-22 LAB — PROTIME-INR
INR: 1.02
Prothrombin Time: 13.4 seconds (ref 11.4–15.2)

## 2015-09-22 MED ORDER — VITAMIN C 500 MG PO TABS
500.0000 mg | ORAL_TABLET | Freq: Every day | ORAL | Status: DC
  Filled 2015-09-22: qty 1

## 2015-09-22 MED ORDER — VITAMIN D 1000 UNITS PO TABS
1000.0000 [IU] | ORAL_TABLET | Freq: Every day | ORAL | Status: DC
  Administered 2015-09-23: 1000 [IU] via ORAL
  Filled 2015-09-22: qty 1

## 2015-09-22 MED ORDER — DEXTROSE 5 % IV SOLN
1.0000 g | INTRAVENOUS | Status: DC
  Filled 2015-09-22: qty 10

## 2015-09-22 MED ORDER — FAMOTIDINE 20 MG PO TABS
20.0000 mg | ORAL_TABLET | Freq: Every day | ORAL | Status: DC
  Administered 2015-09-23: 20 mg via ORAL
  Filled 2015-09-22: qty 1

## 2015-09-22 MED ORDER — IOPAMIDOL (ISOVUE-300) INJECTION 61%
100.0000 mL | Freq: Once | INTRAVENOUS | Status: AC | PRN
  Administered 2015-09-22: 100 mL via INTRAVENOUS

## 2015-09-22 MED ORDER — IOPAMIDOL (ISOVUE-300) INJECTION 61%
30.0000 mL | Freq: Once | INTRAVENOUS | Status: AC | PRN
  Administered 2015-09-22: 30 mL via ORAL

## 2015-09-22 MED ORDER — CEFTRIAXONE SODIUM 1 G IJ SOLR
1.0000 g | INTRAMUSCULAR | Status: AC
  Administered 2015-09-22: 1 g via INTRAVENOUS
  Filled 2015-09-22: qty 10

## 2015-09-22 MED ORDER — ONDANSETRON HCL 4 MG PO TABS: 4.0000 mg | ORAL_TABLET | Freq: Four times a day (QID) | ORAL | Status: DC | PRN

## 2015-09-22 MED ORDER — ERYTHROMYCIN 5 MG/GM OP OINT
1.0000 "application " | TOPICAL_OINTMENT | Freq: Every day | OPHTHALMIC | Status: DC
  Administered 2015-09-22: 1 via OPHTHALMIC
  Filled 2015-09-22: qty 3.5

## 2015-09-22 MED ORDER — ADULT MULTIVITAMIN W/MINERALS CH
1.0000 | ORAL_TABLET | ORAL | Status: DC
Start: 2015-09-23 — End: 2015-09-23
  Administered 2015-09-23: 1 via ORAL
  Filled 2015-09-22: qty 1

## 2015-09-22 MED ORDER — SODIUM CHLORIDE 0.9 % IV SOLN
INTRAVENOUS | Status: DC
  Administered 2015-09-22: 19:00:00 via INTRAVENOUS

## 2015-09-22 MED ORDER — ONDANSETRON HCL 4 MG/2ML IJ SOLN: 4.0000 mg | Freq: Four times a day (QID) | INTRAMUSCULAR | Status: DC | PRN

## 2015-09-22 MED ORDER — HEPARIN SODIUM (PORCINE) 5000 UNIT/ML IJ SOLN
5000.0000 [IU] | Freq: Three times a day (TID) | INTRAMUSCULAR | Status: DC
  Administered 2015-09-22 – 2015-09-23 (×2): 5000 [IU] via SUBCUTANEOUS
  Filled 2015-09-22 (×2): qty 1

## 2015-09-22 MED ORDER — FERROUS SULFATE 325 (65 FE) MG PO TBEC
325.0000 mg | DELAYED_RELEASE_TABLET | Freq: Every day | ORAL | Status: DC
  Administered 2015-09-22: 325 mg via ORAL
  Filled 2015-09-22: qty 1

## 2015-09-22 MED ORDER — CITALOPRAM HYDROBROMIDE 20 MG PO TABS
40.0000 mg | ORAL_TABLET | Freq: Every day | ORAL | Status: DC
  Administered 2015-09-23: 40 mg via ORAL
  Filled 2015-09-22: qty 2

## 2015-09-22 MED ORDER — SODIUM CHLORIDE 0.9 % IV BOLUS (SEPSIS)
1000.0000 mL | INTRAVENOUS | Status: AC
  Administered 2015-09-22: 1000 mL via INTRAVENOUS

## 2015-09-22 MED ORDER — VITAMIN B-12 1000 MCG PO TABS
1000.0000 ug | ORAL_TABLET | Freq: Every day | ORAL | Status: DC
  Administered 2015-09-23: 1000 ug via ORAL
  Filled 2015-09-22: qty 1

## 2015-09-22 NOTE — ED Triage Notes (Signed)
Pt presents with reports of pancreatitis, dysuria and vomiting. Pt was seen by PCP and instructed to come to ED for treatment and possible admission.

## 2015-09-22 NOTE — ED Notes (Signed)
Multiple extra lab tubes sent to lab due to difficulty with lab draws.  Extra lavendar, red, green and tiger top tubes along with blue tube sent to lab.

## 2015-09-22 NOTE — Progress Notes (Signed)
Pharmacy Antibiotic Note  Jenna [name] is a 80 y.o. female admitted on 09/22/2015 with UTI.  Pharmacy has been consulted for ceftriaxone dosing.  Plan: Patient received ceftriaxone 1g x 1 in ED. Patient will be continued on ceftriaxone 1 g IV q 24 beginning tomorrow at 1600.  Height: 5\' 3"  (160 cm) Weight: 205 lb (93 kg) IBW/kg (Calculated) : 52.4  Temp (24hrs), Avg:99.1 F (37.3 C), Min:98.6 F (37 C), Max:99.5 F (37.5 C)   Recent Labs Lab 09/20/15 2200 09/22/15 1212  WBC 8.4 9.7  CREATININE 0.97 0.99  LATICACIDVEN  --  1.1    Estimated Creatinine Clearance: 50.7 mL/min (by C-G formula based on SCr of 0.99 mg/dL).    Allergies  Allergen Reactions  . Codeine Other (See Comments)    "will not stay down"  . Hydromorphone Other (See Comments)    confusion, personality change  . Morphine Other (See Comments)    Goes crazy  . Nsaids Other (See Comments)    Avoids because of gastric bypass surgery  . Oxycodone Nausea And Vomiting  . Oxycodone-Acetaminophen Nausea And Vomiting  . Nabumetone Rash  . Promethazine Hcl Rash and Other (See Comments)    Antimicrobials this admission: Ceftriaxone 9/1 >>    Microbiology results: 9/1 UCx: Sent    Thank you for allowing pharmacy to be a part of this patient's care.  Darrow Bussing, PharmD Pharmacy Resident 09/22/2015 8:20 PM

## 2015-09-22 NOTE — ED Notes (Signed)
Patient transported to CT 

## 2015-09-22 NOTE — ED Provider Notes (Signed)
[facility] Emergency Department Provider Note  ____________________________________________   First MD Initiated Contact with Patient 09/22/15 1137     (approximate)  I have reviewed the triage vital signs and the nursing notes.   HISTORY  Chief Complaint Pancreatitis; Emesis; and Dysuria    HPI Jenna [name] is a 80 y.o. female who presents due to abnormal labs at her PCP.  She was seen in the emergency department 2 days ago for nonspecific chest pain 2 distinct episodes.  She had a normal basic metabolic panel and 2 negative troponins and was discharged home feeling better.  She followed up with her primary care doctor yesterday and they repeated some outside lab work.  They called her late last night and told her that her liver enzymes were elevated and that they were going to talk to a gastroenterologist this morning.  They did so this morning and called her back and told her that she should come to the emergency department.  She says that she has been having some nausea and dry heaves but no vomiting.  She was told last night did not drink anything except water so she did so.  She denies chest pain, shortness of breath, fever.  She has felt very cold at night but has had no other symptoms.  She denies abdominal pain although over the last several days and currently.  She had her gallbladder removed decades ago.  She has not started on any new medications except for Augmentin which her PCP started her on yesterday, reportedly for urinary tract infection.  He does not drink alcohol and does not do drugs.  Past Medical History:  Diagnosis Date  . Arthritis    neck, knees(before replacements)  . Asthma   . COPD (chronic obstructive pulmonary disease) ([place])   . Depression   . Diabetes mellitus without complication ([place])    resolved with gastric bypass  . Encounter for colonoscopy due to history of adenomatous colonic polyps   . GERD (gastroesophageal  reflux disease)   . Hearing loss of both ears   . Hyperlipidemia   . Hypertension   . Palpitations   . Sleep apnea    resolved with gastric bypass  . Syncope and collapse     Patient Active Problem List   Diagnosis Date Noted  . Acute on chronic respiratory failure with hypoxemia ([place]) 06/13/2014  . Pericardial effusion 11/13/2013  . Morbid obesity ([place]) 11/13/2013  . Hyperlipidemia 11/13/2013  . Esophageal reflux 11/13/2013  . Bilateral leg edema 11/13/2013  . SOB (shortness of breath) 11/13/2013  . Chronic cough 11/13/2013    Past Surgical History:  Procedure Laterality Date  . BROW LIFT Bilateral 06/27/2015   Procedure: BLEPHAROPLASTY BILATERAL UPPER EYELIDS BILATERAL BLEPHAROTOSIS EYELIDS;  Surgeon: Karle Starch, MD;  Location: [facility];  Service: Ophthalmology;  Laterality: Bilateral;  BILATERAL  . CATARACT EXTRACTION W/PHACO Right 02/13/2015   Procedure: CATARACT EXTRACTION PHACO AND INTRAOCULAR LENS PLACEMENT (IOC);  Surgeon: Ronnell Freshwater, MD;  Location: [facility];  Service: Ophthalmology;  Laterality: Right;  . CATARACT EXTRACTION W/PHACO Left 03/22/2015   Procedure: CATARACT EXTRACTION PHACO AND INTRAOCULAR LENS PLACEMENT (IOC);  Surgeon: Ronnell Freshwater, MD;  Location: [facility];  Service: Ophthalmology;  Laterality: Left;  TORIC  . CHOLECYSTECTOMY  1984  . COLECTOMY    . COSMETIC SURGERY  2012   tummy tuck and excess skin removal  . GASTRIC BYPASS  2010  . REPLACEMENT TOTAL KNEE  BILATERAL  1998  . SHOULDER SURGERY  2009   left   . TONSILLECTOMY    . TOTAL ABDOMINAL HYSTERECTOMY  1979    Prior to Admission medications   Medication Sig Start Date End Date Taking? Authorizing Provider  albuterol (PROVENTIL HFA;VENTOLIN HFA) 108 (90 BASE) MCG/ACT inhaler Inhale 2 puffs into the lungs every 6 (six) hours as needed for wheezing or shortness of breath. 06/14/14  Yes Demetrios Loll, MD  amoxicillin-clavulanate (AUGMENTIN)  875-125 MG tablet Take 1 tablet by mouth 2 (two) times daily. For 10 days   Yes Historical Provider, MD  Ascorbic Acid (VITAMIN C) 1000 MG tablet Take 500 mg by mouth daily.    Yes Historical Provider, MD  Cholecalciferol (VITAMIN D-1000 MAX ST) 1000 UNITS tablet Take 1,000 Units by mouth daily.    Yes Historical Provider, MD  citalopram (CELEXA) 40 MG tablet Take 40 mg by mouth daily.    Yes Historical Provider, MD  Cyanocobalamin (RA VITAMIN B-12 TR) 1000 MCG TBCR Take 1,000 mcg by mouth daily.    Yes Historical Provider, MD  docusate sodium (COLACE) 100 MG capsule Take 100 mg by mouth at bedtime.    Yes Historical Provider, MD  erythromycin ophthalmic ointment 1 application at bedtime. Use a small amount on sutures 4 times a day for 10-14 days   Yes Historical Provider, MD  ferrous sulfate 325 (65 FE) MG EC tablet Take 325 mg by mouth at bedtime.    Yes Historical Provider, MD  furosemide (LASIX) 20 MG tablet Take 20 mg by mouth daily.  09/28/13  Yes Historical Provider, MD  Multiple Vitamins-Minerals (ONE-A-DAY WOMENS PETITES) TABS Take 2 tablets by mouth every morning.   Yes Historical Provider, MD  Omega-3 Fatty Acids (FISH OIL) 1000 MG CAPS Take 1,000 mg by mouth daily.   Yes Historical Provider, MD  ondansetron (ZOFRAN-ODT) 4 MG disintegrating tablet Take 4 mg by mouth every 8 (eight) hours as needed for nausea or vomiting.   Yes Historical Provider, MD  ranitidine (ZANTAC) 150 MG tablet Take 1 tablet (150 mg total) by mouth at bedtime. Patient taking differently: Take 150 mg by mouth 2 (two) times daily.  09/21/15 09/20/16 Yes Daymon Larsen, MD  simvastatin (ZOCOR) 40 MG tablet Take 40 mg by mouth daily.    Yes Historical Provider, MD  amoxicillin (AMOXIL) 500 MG capsule Take 500 mg by mouth as needed (4 caps 1 hour prior to dental appt). Reported on 02/13/2015    Historical Provider, MD    Allergies Codeine; Hydromorphone; Morphine; Nsaids; Oxycodone; Oxycodone-acetaminophen; Nabumetone;  and Promethazine hcl  Family History  Problem Relation Age of Onset  . Heart attack Mother   . Hypertension Mother   . Heart attack Brother   . Heart attack Sister 88  . Hyperlipidemia Sister   . Hypertension Sister     Social History Social History  Substance Use Topics  . Smoking status: Former Smoker    Packs/day: 0.25    Years: 15.00    Types: Cigarettes  . Smokeless tobacco: Never Used  . Alcohol use No    Review of Systems Constitutional: No fever/chills Eyes: No visual changes. ENT: No sore throat. Cardiovascular: Denies chest pain since 2 days ago (two distinct episodes) Respiratory: Denies shortness of breath. Gastrointestinal: No abdominal pain.  +N/V.  No diarrhea.  No constipation. Genitourinary: Negative for dysuria. Musculoskeletal: Negative for back pain. Skin: Negative for rash. Neurological: Negative for headaches, focal weakness or numbness.  10-point ROS otherwise  negative.  ____________________________________________   PHYSICAL EXAM:  VITAL SIGNS: ED Triage Vitals  Enc Vitals Group     BP 09/22/15 1042 (!) 124/48     Pulse Rate 09/22/15 1042 67     Resp 09/22/15 1042 16     Temp 09/22/15 1042 98.6 F (37 C)     Temp Source 09/22/15 1042 Oral     SpO2 09/22/15 1042 96 %     Weight 09/22/15 1048 205 lb (93 kg)     Height 09/22/15 1048 5\' 3"  (1.6 m)     Head Circumference --      Peak Flow --      Pain Score 09/22/15 1049 0     Pain Loc --      Pain Edu? --      Excl. in [place]? --     Constitutional: Alert and oriented. No acute distress.  Appears ill but non-toxic Eyes: +Scleral icterus. PERRL. EOMI. Head: Atraumatic. Nose: No congestion/rhinnorhea. Mouth/Throat: Mucous membranes are moist.  Oropharynx non-erythematous. Neck: No stridor.  No meningeal signs.   Cardiovascular: Normal rate, regular rhythm. Good peripheral circulation. Grossly normal heart sounds. Respiratory: Normal respiratory effort.  No retractions. Lungs  CTAB. Gastrointestinal: Soft but tender to palpation of epigastrium and RUQ Musculoskeletal: No lower extremity tenderness nor edema. No gross deformities of extremities. Neurologic:  Normal speech and language. No gross focal neurologic deficits are appreciated.  Skin:  +Mild jaundice.  Skin is warm, dry and intact. No rash noted. Psychiatric: Mood and affect are normal. Speech and behavior are normal.  ____________________________________________   LABS (all labs ordered are listed, but only abnormal results are displayed)  Labs Reviewed  COMPREHENSIVE METABOLIC PANEL - Abnormal; Notable for the following:       Result Value   Glucose, Bld 132 (*)    Calcium 8.7 (*)    Total Protein 5.8 (*)    Albumin 3.3 (*)    AST 464 (*)    ALT 591 (*)    Alkaline Phosphatase 184 (*)    Total Bilirubin 3.8 (*)    GFR calc non Af Amer 53 (*)    All other components within normal limits  CBC - Abnormal; Notable for the following:    Platelets 135 (*)    All other components within normal limits  URINALYSIS COMPLETEWITH MICROSCOPIC ([facility] ONLY) - Abnormal; Notable for the following:    Color, Urine AMBER (*)    APPearance CLEAR (*)    Bilirubin Urine 2+ (*)    Ketones, ur TRACE (*)    Protein, ur 30 (*)    Leukocytes, UA 1+ (*)    Squamous Epithelial / LPF 6-30 (*)    All other components within normal limits  URINE CULTURE  LIPASE, BLOOD  LACTIC ACID, PLASMA  ACETAMINOPHEN LEVEL   ____________________________________________  EKG  ED ECG REPORT I, Jolynda Townley, the attending physician, personally viewed and interpreted this ECG.  Date: 09/22/2015 EKG Time: 12:39 Rate: 65 Rhythm: normal sinus rhythm QRS Axis: normal Intervals: normal ST/T Wave abnormalities: normal Conduction Disturbances: none Narrative Interpretation: unremarkable occasional PVC.  Non-specific ST segment / T-wave changes, but no evidence of acute  ischemia.   ____________________________________________  RADIOLOGY   Ct Abdomen Pelvis W Contrast  Result Date: 09/22/2015 CLINICAL DATA:  Pancreatitis, dysuria and vomiting. EXAM: CT ABDOMEN AND PELVIS WITH CONTRAST TECHNIQUE: Multidetector CT imaging of the abdomen and pelvis was performed using the standard protocol following bolus administration of intravenous contrast. CONTRAST:  115mL ISOVUE-300 IOPAMIDOL (ISOVUE-300) INJECTION 61% COMPARISON:  1/20/ 9 FINDINGS: Lower chest:  No acute findings. Hepatobiliary: There is no focal liver abnormality identified. The patient is status post cholecystectomy. The common bile duct is mildly increased in caliber measuring 7 mm. No obstructing stone identified. Pancreas: No mass, inflammatory changes, or other significant abnormality. No significant peripancreatic fluid or fluid collections. Spleen: Within normal limits in size and appearance. Adrenals/Urinary Tract: Normal appearance of the adrenal glands. The kidneys are unremarkable. No mass or hydronephrosis identified. The urinary bladder is unremarkable. Stomach/Bowel: Postoperative changes involving the stomach identified compatible with previous gastric bypass surgery. The small bowel loops have a normal caliber. No pathologic dilatation of the colon identified. Vascular/Lymphatic: Calcified atherosclerotic disease involves the abdominal aorta. No aneurysm. No enlarged retroperitoneal or mesenteric adenopathy. No enlarged pelvic or inguinal lymph nodes. Reproductive: No mass or other significant abnormality. Other: None. Musculoskeletal: No suspicious bone lesions identified. L4-5 and L5-S1 degenerative disc disease noted. Facet hypertrophy and degenerative change noted. IMPRESSION: 1. No acute findings within the abdomen or pelvis. No complications of pancreatitis identified. Specifically there is no significant upper abdominal free fluid and no fluid collections identified to suggest pseudocyst. 2. Mild  increase caliber of the CBD status post cholecystectomy 3. Aortic atherosclerosis 4. Lumbar degenerative disc disease. Electronically Signed   By: Kerby Moors M.D.   On: 09/22/2015 15:32    ____________________________________________   PROCEDURES  Procedure(s) performed:   Procedures   Critical Care performed: No ____________________________________________   INITIAL IMPRESSION / ASSESSMENT AND PLAN / ED COURSE  Pertinent labs & imaging results that were available during my care of the patient were reviewed by me and considered in my medical decision making (see chart for details).  Hx of cholecystectomy, concerned for possible neoplasms causing hepatitis, hyperbilirubinemia.  Labs, CT scan, reassess.   Clinical Course  Value Comment By Time   The patient has a normal lipase but her AST and ALT are consistent with acute hepatitis and she has a total bilirubin of 3.8.  Her lactic acid is normal.  I will obtain a CT scan of her abdomen and pelvis for further evaluation; her creatinine is normal. Hinda Kehr, MD 09/01 1401  CT ABDOMEN PELVIS W CONTRAST CT scan is unremarkable with no obvious cause for her hyperbilirubinemia and acute hepatitis.  I will admit for further treatment and evaluation.  I gave her ceftriaxone for a possible urinary tract infection and urine culture is pending. Hinda Kehr, MD 09/01 1604    ____________________________________________  FINAL CLINICAL IMPRESSION(S) / ED DIAGNOSES  Final diagnoses:  Acute hepatitis  Hyperbilirubinemia  UTI (lower urinary tract infection)     MEDICATIONS GIVEN DURING THIS VISIT:  Medications  cefTRIAXone (ROCEPHIN) 1 g in dextrose 5 % 50 mL IVPB (1 g Intravenous New Bag/Given 09/22/15 1547)  sodium chloride 0.9 % bolus 1,000 mL (0 mLs Intravenous Stopped 09/22/15 1317)  iopamidol (ISOVUE-300) 61 % injection 30 mL (30 mLs Oral Contrast Given 09/22/15 1419)  iopamidol (ISOVUE-300) 61 % injection 100 mL (100 mLs  Intravenous Contrast Given 09/22/15 1500)     NEW OUTPATIENT MEDICATIONS STARTED DURING THIS VISIT:  New Prescriptions   No medications on file      Note:  This document was prepared using Dragon voice recognition software and may include unintentional dictation errors.    Hinda Kehr, MD 09/22/15 [phone]

## 2015-09-22 NOTE — ED Notes (Signed)
CT notified pt is finished with contrast. 

## 2015-09-22 NOTE — H&P (Addendum)
[facility] [place] NAME: Jenna [name]    MR#:  [id number]  DATE OF BIRTH:  11/23/1935   DATE OF ADMISSION:  09/22/2015  PRIMARY CARE PHYSICIAN: Sofie Hartigan, MD   REQUESTING/REFERRING PHYSICIAN: Karma Greaser  CHIEF COMPLAINT:   Chief Complaint  Patient presents with  . Pancreatitis  . Emesis  . Dysuria    HISTORY OF PRESENT ILLNESS:  Jenna [name]  is a 80 y.o. female with a known history of Hyperlipidemia unspecified, prior cholecystectomy, prior gastric bypass who is presenting with abnormal lab work. She is recently evaluated the emergency department complaining of chest pain that has resolved. On further questioning she actually describes chest pain being in her right upper quadrant worse after eating and drinking but has resolved. She followed with her primary care physician did routine blood work and informed her that she had abnormal liver numbers and to present to Hospital further workup and evaluation.  As an outpatient 09/21/2015 AST 1584 ALT 833 Alkaline phosphatase 237 Bilirubin 1.7   She attests to having some chills for 3-4 day duration but denies any further symptomatology at this time  PAST MEDICAL HISTORY:   Past Medical History:  Diagnosis Date  . Arthritis    neck, knees(before replacements)  . Asthma   . COPD (chronic obstructive pulmonary disease) ([place])   . Depression   . Diabetes mellitus without complication ([place])    resolved with gastric bypass  . Encounter for colonoscopy due to history of adenomatous colonic polyps   . GERD (gastroesophageal reflux disease)   . Hearing loss of both ears   . Hyperlipidemia   . Hypertension   . Palpitations   . Sleep apnea    resolved with gastric bypass  . Syncope and collapse     PAST SURGICAL HISTORY:   Past Surgical History:  Procedure Laterality Date  . BROW LIFT Bilateral 06/27/2015   Procedure: BLEPHAROPLASTY BILATERAL UPPER EYELIDS BILATERAL  BLEPHAROTOSIS EYELIDS;  Surgeon: Karle Starch, MD;  Location: [facility];  Service: Ophthalmology;  Laterality: Bilateral;  BILATERAL  . CATARACT EXTRACTION W/PHACO Right 02/13/2015   Procedure: CATARACT EXTRACTION PHACO AND INTRAOCULAR LENS PLACEMENT (IOC);  Surgeon: Ronnell Freshwater, MD;  Location: [facility];  Service: Ophthalmology;  Laterality: Right;  . CATARACT EXTRACTION W/PHACO Left 03/22/2015   Procedure: CATARACT EXTRACTION PHACO AND INTRAOCULAR LENS PLACEMENT (IOC);  Surgeon: Ronnell Freshwater, MD;  Location: [facility];  Service: Ophthalmology;  Laterality: Left;  TORIC  . CHOLECYSTECTOMY  1984  . COLECTOMY    . COSMETIC SURGERY  2012   tummy tuck and excess skin removal  . GASTRIC BYPASS  2010  . REPLACEMENT TOTAL KNEE BILATERAL  1998  . SHOULDER SURGERY  2009   left   . TONSILLECTOMY    . TOTAL ABDOMINAL HYSTERECTOMY  1979    SOCIAL HISTORY:   Social History  Substance Use Topics  . Smoking status: Former Smoker    Packs/day: 0.25    Years: 15.00    Types: Cigarettes  . Smokeless tobacco: Never Used  . Alcohol use No    FAMILY HISTORY:   Family History  Problem Relation Age of Onset  . Heart attack Mother   . Hypertension Mother   . Heart attack Brother   . Heart attack Sister 64  . Hyperlipidemia Sister   . Hypertension Sister     DRUG ALLERGIES:   Allergies  Allergen Reactions  . Codeine  Other (See Comments)    "will not stay down"  . Hydromorphone Other (See Comments)    confusion, personality change  . Morphine Other (See Comments)    Goes crazy  . Nsaids Other (See Comments)    Avoids because of gastric bypass surgery  . Oxycodone Nausea And Vomiting  . Oxycodone-Acetaminophen Nausea And Vomiting  . Nabumetone Rash  . Promethazine Hcl Rash and Other (See Comments)    REVIEW OF SYSTEMS:  REVIEW OF SYSTEMS:  CONSTITUTIONAL: Denies fevers,Positive chills, fatigue, weakness.  EYES: Denies blurred  vision, double vision, or eye pain.  EARS, NOSE, THROAT: Denies tinnitus, ear pain, hearing loss.  RESPIRATORY: denies cough, shortness of breath, wheezing  CARDIOVASCULAR: Denies chest pain, palpitations, edema.  GASTROINTESTINAL: Denies nausea, vomiting, diarrhea, abdominal pain.  GENITOURINARY: Denies dysuria, hematuria.  ENDOCRINE: Denies nocturia or thyroid problems. HEMATOLOGIC AND LYMPHATIC: Denies easy bruising or bleeding.  SKIN: Denies rash or lesions.  MUSCULOSKELETAL: Denies pain in neck, back, shoulder, knees, hips, or further arthritic symptoms.  NEUROLOGIC: Denies paralysis, paresthesias.  PSYCHIATRIC: Denies anxiety or depressive symptoms. Otherwise full review of systems performed by me is negative.   MEDICATIONS AT HOME:   Prior to Admission medications   Medication Sig Start Date End Date Taking? Authorizing Provider  albuterol (PROVENTIL HFA;VENTOLIN HFA) 108 (90 BASE) MCG/ACT inhaler Inhale 2 puffs into the lungs every 6 (six) hours as needed for wheezing or shortness of breath. 06/14/14  Yes Demetrios Loll, MD  amoxicillin-clavulanate (AUGMENTIN) 875-125 MG tablet Take 1 tablet by mouth 2 (two) times daily. For 10 days   Yes Historical Provider, MD  Ascorbic Acid (VITAMIN C) 1000 MG tablet Take 500 mg by mouth daily.    Yes Historical Provider, MD  Cholecalciferol (VITAMIN D-1000 MAX ST) 1000 UNITS tablet Take 1,000 Units by mouth daily.    Yes Historical Provider, MD  citalopram (CELEXA) 40 MG tablet Take 40 mg by mouth daily.    Yes Historical Provider, MD  Cyanocobalamin (RA VITAMIN B-12 TR) 1000 MCG TBCR Take 1,000 mcg by mouth daily.    Yes Historical Provider, MD  docusate sodium (COLACE) 100 MG capsule Take 100 mg by mouth at bedtime.    Yes Historical Provider, MD  erythromycin ophthalmic ointment 1 application at bedtime. Use a small amount on sutures 4 times a day for 10-14 days   Yes Historical Provider, MD  ferrous sulfate 325 (65 FE) MG EC tablet Take 325 mg by  mouth at bedtime.    Yes Historical Provider, MD  furosemide (LASIX) 20 MG tablet Take 20 mg by mouth daily.  09/28/13  Yes Historical Provider, MD  Multiple Vitamins-Minerals (ONE-A-DAY WOMENS PETITES) TABS Take 2 tablets by mouth every morning.   Yes Historical Provider, MD  Omega-3 Fatty Acids (FISH OIL) 1000 MG CAPS Take 1,000 mg by mouth daily.   Yes Historical Provider, MD  ondansetron (ZOFRAN-ODT) 4 MG disintegrating tablet Take 4 mg by mouth every 8 (eight) hours as needed for nausea or vomiting.   Yes Historical Provider, MD  ranitidine (ZANTAC) 150 MG tablet Take 1 tablet (150 mg total) by mouth at bedtime. Patient taking differently: Take 150 mg by mouth 2 (two) times daily.  09/21/15 09/20/16 Yes Daymon Larsen, MD  simvastatin (ZOCOR) 40 MG tablet Take 40 mg by mouth daily.    Yes Historical Provider, MD  amoxicillin (AMOXIL) 500 MG capsule Take 500 mg by mouth as needed (4 caps 1 hour prior to dental appt). Reported on 02/13/2015  Historical Provider, MD      VITAL SIGNS:  Blood pressure (!) 136/54, pulse 71, temperature 98.6 F (37 C), temperature source Oral, resp. rate 19, height 5\' 3"  (1.6 m), weight 93 kg (205 lb), SpO2 99 %.  PHYSICAL EXAMINATION:  VITAL SIGNS: Vitals:   09/22/15 1430 09/22/15 1600  BP: 126/65 (!) 136/54  Pulse: 68 71  Resp: 19 19  Temp:     GENERAL:80 y.o.female currently in no acute distress.  HEAD: Normocephalic, atraumatic.  EYES: Pupils equal, round, reactive to light. Extraocular muscles intact. Positive scleral icterus.  MOUTH: Moist mucosal membrane. Dentition intact. No abscess noted. Positive sublingual jaundice EAR, NOSE, THROAT: Clear without exudates. No external lesions.  NECK: Supple. No thyromegaly. No nodules. No JVD.  PULMONARY: Clear to ascultation, without wheeze rails or rhonci. No use of accessory muscles, Good respiratory effort. good air entry bilaterally CHEST: Nontender to palpation.  CARDIOVASCULAR: S1 and S2. Regular rate  and rhythm. No murmurs, rubs, or gallops. No edema. Pedal pulses 2+ bilaterally.  GASTROINTESTINAL: Soft, nontender, nondistended. No masses. Positive bowel sounds. No hepatosplenomegaly.  MUSCULOSKELETAL: No swelling, clubbing, or edema. Range of motion full in all extremities.  NEUROLOGIC: Cranial nerves II through XII are intact. No gross focal neurological deficits. Sensation intact. Reflexes intact.  SKIN: No ulceration, lesions, rashes, or cyanosis. Skin warm and dry. Turgor intact.  PSYCHIATRIC: Mood, affect within normal limits. The patient is awake, alert and oriented x 3. Insight, judgment intact.    LABORATORY PANEL:   CBC  Recent Labs Lab 09/22/15 1212  WBC 9.7  HGB 12.9  HCT 37.8  PLT 135*   ------------------------------------------------------------------------------------------------------------------  Chemistries   Recent Labs Lab 09/22/15 1212  NA 136  K 4.3  CL 104  CO2 24  GLUCOSE 132*  BUN 17  CREATININE 0.99  CALCIUM 8.7*  AST 464*  ALT 591*  ALKPHOS 184*  BILITOT 3.8*   ------------------------------------------------------------------------------------------------------------------  Cardiac Enzymes  Recent Labs Lab 09/21/15 0110  TROPONINI <0.03   ------------------------------------------------------------------------------------------------------------------  RADIOLOGY:  Dg Chest 1 View  Result Date: 09/20/2015 CLINICAL DATA:  Midsternal chest pain radiating to the back. EXAM: CHEST 1 VIEW COMPARISON:  06/13/2014 FINDINGS: A single AP portable view of the chest demonstrates no focal airspace consolidation or alveolar edema. The lungs are grossly clear. There is no large effusion or pneumothorax. Cardiac and mediastinal contours appear unremarkable. IMPRESSION: No active disease. Electronically Signed   By: Andreas Newport M.D.   On: 09/20/2015 22:11   Ct Abdomen Pelvis W Contrast  Result Date: 09/22/2015 CLINICAL DATA:   Pancreatitis, dysuria and vomiting. EXAM: CT ABDOMEN AND PELVIS WITH CONTRAST TECHNIQUE: Multidetector CT imaging of the abdomen and pelvis was performed using the standard protocol following bolus administration of intravenous contrast. CONTRAST:  140mL ISOVUE-300 IOPAMIDOL (ISOVUE-300) INJECTION 61% COMPARISON:  1/20/ 9 FINDINGS: Lower chest:  No acute findings. Hepatobiliary: There is no focal liver abnormality identified. The patient is status post cholecystectomy. The common bile duct is mildly increased in caliber measuring 7 mm. No obstructing stone identified. Pancreas: No mass, inflammatory changes, or other significant abnormality. No significant peripancreatic fluid or fluid collections. Spleen: Within normal limits in size and appearance. Adrenals/Urinary Tract: Normal appearance of the adrenal glands. The kidneys are unremarkable. No mass or hydronephrosis identified. The urinary bladder is unremarkable. Stomach/Bowel: Postoperative changes involving the stomach identified compatible with previous gastric bypass surgery. The small bowel loops have a normal caliber. No pathologic dilatation of the colon identified. Vascular/Lymphatic: Calcified atherosclerotic disease  involves the abdominal aorta. No aneurysm. No enlarged retroperitoneal or mesenteric adenopathy. No enlarged pelvic or inguinal lymph nodes. Reproductive: No mass or other significant abnormality. Other: None. Musculoskeletal: No suspicious bone lesions identified. L4-5 and L5-S1 degenerative disc disease noted. Facet hypertrophy and degenerative change noted. IMPRESSION: 1. No acute findings within the abdomen or pelvis. No complications of pancreatitis identified. Specifically there is no significant upper abdominal free fluid and no fluid collections identified to suggest pseudocyst. 2. Mild increase caliber of the CBD status post cholecystectomy 3. Aortic atherosclerosis 4. Lumbar degenerative disc disease. Electronically Signed   By:  Kerby Moors M.D.   On: 09/22/2015 15:32    EKG:   Orders placed or performed during the hospital encounter of 09/22/15  . ED EKG  . ED EKG  . EKG 12-Lead  . EKG 12-Lead    IMPRESSION AND PLAN:   80 year old Caucasian female history hyperlipidemia, prior cholecystectomy, gastric bypass surgery who is presenting with abnormal blood work  1. Acute liver failure: Numbers have actually improved today, likely transient obstruction but with history of chills and generalized fatigue with elevated ALT: AST picture is pointing towards viral etiology. Check hepatitis, EBV, CMV, for completeness had ANA and anti-smooth muscle antibody. Check INR for her liver function, gastroenterology neurology consult, hold statins  2. Hyperlipidemia unspecified: Statin therapy 3. UTI: Ceftriaxone follow culture data    All the records are reviewed and case discussed with ED provider. Management plans discussed with the patient, family and they are in agreement.  CODE STATUS: Full  TOTAL TIME TAKING CARE OF THIS PATIENT: 45 minutes.    Hower,  Karenann Cai.D on 09/22/2015 at 4:40 PM  Between 7am to 6pm - Pager - [phone]  After 6pm: House Pager: - [phone]  [facility]  Office  [phone]  CC: Primary care physician; [facility], Chrissie Noa, MD

## 2015-09-22 NOTE — ED Triage Notes (Signed)
Pt was seen by PCP and has elevated liver enzymes.

## 2015-09-23 LAB — COMPREHENSIVE METABOLIC PANEL
ALT: 395 U/L — ABNORMAL HIGH (ref 14–54)
ALT: 348 U/L — ABNORMAL HIGH (ref 14–54)
AST: 216 U/L — ABNORMAL HIGH (ref 15–41)
AST: 186 U/L — ABNORMAL HIGH (ref 15–41)
Albumin: 3 g/dL — ABNORMAL LOW (ref 3.5–5.0)
Albumin: 3.2 g/dL — ABNORMAL LOW (ref 3.5–5.0)
Alkaline Phosphatase: 174 U/L — ABNORMAL HIGH (ref 38–126)
Alkaline Phosphatase: 184 U/L — ABNORMAL HIGH (ref 38–126)
Anion gap: 6 (ref 5–15)
Anion gap: 8 (ref 5–15)
BUN: 14 mg/dL (ref 6–20)
BUN: 14 mg/dL (ref 6–20)
CO2: 24 mmol/L (ref 22–32)
CO2: 23 mmol/L (ref 22–32)
Calcium: 8.1 mg/dL — ABNORMAL LOW (ref 8.9–10.3)
Calcium: 8.2 mg/dL — ABNORMAL LOW (ref 8.9–10.3)
Chloride: 106 mmol/L (ref 101–111)
Chloride: 105 mmol/L (ref 101–111)
Creatinine, Ser: 0.88 mg/dL (ref 0.44–1.00)
Creatinine, Ser: 0.91 mg/dL (ref 0.44–1.00)
GFR calc Af Amer: >60 mL/min (ref >60)
GFR calc Af Amer: >60 mL/min (ref >60)
GFR calc non Af Amer: >60 mL/min (ref >60)
GFR calc non Af Amer: 59 mL/min — ABNORMAL LOW (ref >60)
Glucose, Bld: 112 mg/dL — ABNORMAL HIGH (ref 65–99)
Glucose, Bld: 138 mg/dL — ABNORMAL HIGH (ref 65–99)
Potassium: 3.5 mmol/L (ref 3.5–5.1)
Potassium: 3.4 mmol/L — ABNORMAL LOW (ref 3.5–5.1)
Sodium: 136 mmol/L (ref 135–145)
Sodium: 136 mmol/L (ref 135–145)
Total Bilirubin: 2.4 mg/dL — ABNORMAL HIGH (ref 0.3–1.2)
Total Bilirubin: 2.6 mg/dL — ABNORMAL HIGH (ref 0.3–1.2)
Total Protein: 5.9 g/dL — ABNORMAL LOW (ref 6.5–8.1)
Total Protein: 5.7 g/dL — ABNORMAL LOW (ref 6.5–8.1)

## 2015-09-23 LAB — CBC
HCT: 34.6 % — ABNORMAL LOW (ref 35.0–47.0)
Hemoglobin: 12.1 g/dL (ref 12.0–16.0)
MCH: 33.4 pg (ref 26.0–34.0)
MCHC: 35 g/dL (ref 32.0–36.0)
MCV: 95.4 fL (ref 80.0–100.0)
Platelets: 136 10*3/uL — ABNORMAL LOW (ref 150–440)
RBC: 3.63 MIL/uL — ABNORMAL LOW (ref 3.80–5.20)
RDW: 12.8 % (ref 11.5–14.5)
WBC: 6.2 10*3/uL (ref 3.6–11.0)

## 2015-09-23 LAB — URINE CULTURE
Culture: NO GROWTH
Special Requests: NORMAL

## 2015-09-23 LAB — HEPATITIS PANEL, ACUTE
HCV Ab: <0.1 s/co ratio (ref 0.0–0.9)
Hep A IgM: NEGATIVE
Hep B C IgM: NEGATIVE
Hepatitis B Surface Ag: NEGATIVE

## 2015-09-23 LAB — GLUCOSE, CAPILLARY: Glucose-Capillary: 195 mg/dL — ABNORMAL HIGH (ref 65–99)

## 2015-09-23 LAB — PROTIME-INR
INR: 0.96
Prothrombin Time: 12.8 seconds (ref 11.4–15.2)

## 2015-09-23 LAB — ANA W/REFLEX: Anti Nuclear Antibody(ANA): NEGATIVE

## 2015-09-23 LAB — CMV ANTIBODY, IGG (EIA): CMV Ab - IgG: >10 U/mL — ABNORMAL HIGH (ref 0.00–0.59)

## 2015-09-23 LAB — CMV IGM: CMV IgM: <30 AU/mL (ref 0.0–29.9)

## 2015-09-23 MED ORDER — CIPROFLOXACIN HCL 500 MG PO TABS: 500.0000 mg | ORAL_TABLET | Freq: Two times a day (BID) | ORAL | Status: DC

## 2015-09-23 MED ORDER — INSULIN ASPART 100 UNIT/ML ~~LOC~~ SOLN: 0.0000 [IU] | Freq: Three times a day (TID) | SUBCUTANEOUS | Status: DC

## 2015-09-23 MED ORDER — CIPROFLOXACIN HCL 500 MG PO TABS: 500.0000 mg | ORAL_TABLET | Freq: Two times a day (BID) | ORAL | 0 refills | Status: DC

## 2015-09-23 MED ORDER — INSULIN ASPART 100 UNIT/ML ~~LOC~~ SOLN: 0.0000 [IU] | Freq: Every day | SUBCUTANEOUS | Status: DC

## 2015-09-23 NOTE — Consult Note (Signed)
GI Inpatient Consult Note  Reason for Consult:Abd pain and elevated liver blood tests   Attending Requesting Consult:Dr. Bridgett Larsson  History of Present Illness: Jenna [name] is a 80 y.o. female who had onset of severe abd pain in epigastric and RUQ pain and saw a doctor and had very elevated LFT's.  A CT did not show any CBD blockage, slight dilatation of 20mm.  She has hx of gastric bypass surgery.  Since admission her LFT's are falling rapidly and her abd pain is almost gone.  She is not having nausea, fever, chills, vomiting.  She did start Augmentin at the time of the doctor encounter.  That was a few days ago.  Past Medical History:  Past Medical History:  Diagnosis Date  . Arthritis    neck, knees(before replacements)  . Asthma   . COPD (chronic obstructive pulmonary disease) ([place])   . Depression   . Diabetes mellitus without complication ([place])    resolved with gastric bypass  . Encounter for colonoscopy due to history of adenomatous colonic polyps   . GERD (gastroesophageal reflux disease)   . Hearing loss of both ears   . Hyperlipidemia   . Hypertension   . Palpitations   . Sleep apnea    resolved with gastric bypass  . Syncope and collapse     Problem List: Patient Active Problem List   Diagnosis Date Noted  . Hepatic failure, acute 09/22/2015  . Morbid obesity ([place]) 11/13/2013  . Hyperlipidemia 11/13/2013  . Esophageal reflux 11/13/2013  . Bilateral leg edema 11/13/2013  . Chronic cough 11/13/2013    Past Surgical History: Past Surgical History:  Procedure Laterality Date  . BROW LIFT Bilateral 06/27/2015   Procedure: BLEPHAROPLASTY BILATERAL UPPER EYELIDS BILATERAL BLEPHAROTOSIS EYELIDS;  Surgeon: Karle Starch, MD;  Location: [facility];  Service: Ophthalmology;  Laterality: Bilateral;  BILATERAL  . CATARACT EXTRACTION W/PHACO Right 02/13/2015   Procedure: CATARACT EXTRACTION PHACO AND INTRAOCULAR LENS PLACEMENT (IOC);  Surgeon: Ronnell Freshwater, MD;   Location: [facility];  Service: Ophthalmology;  Laterality: Right;  . CATARACT EXTRACTION W/PHACO Left 03/22/2015   Procedure: CATARACT EXTRACTION PHACO AND INTRAOCULAR LENS PLACEMENT (IOC);  Surgeon: Ronnell Freshwater, MD;  Location: [facility];  Service: Ophthalmology;  Laterality: Left;  TORIC  . CHOLECYSTECTOMY  1984  . COLECTOMY    . COSMETIC SURGERY  2012   tummy tuck and excess skin removal  . GASTRIC BYPASS  2010  . REPLACEMENT TOTAL KNEE BILATERAL  1998  . SHOULDER SURGERY  2009   left   . TONSILLECTOMY    . TOTAL ABDOMINAL HYSTERECTOMY  1979    Allergies: Allergies  Allergen Reactions  . Codeine Other (See Comments)    "will not stay down"  . Hydromorphone Other (See Comments)    confusion, personality change  . Morphine Other (See Comments)    Goes crazy  . Nsaids Other (See Comments)    Avoids because of gastric bypass surgery  . Oxycodone Nausea And Vomiting  . Oxycodone-Acetaminophen Nausea And Vomiting  . Nabumetone Rash  . Promethazine Hcl Rash and Other (See Comments)    Home Medications: Prescriptions Prior to Admission  Medication Sig Dispense Refill Last Dose  . albuterol (PROVENTIL HFA;VENTOLIN HFA) 108 (90 BASE) MCG/ACT inhaler Inhale 2 puffs into the lungs every 6 (six) hours as needed for wheezing or shortness of breath. 1 Inhaler 2 Past Month at Unknown time  . amoxicillin-clavulanate (AUGMENTIN) 875-125 MG tablet Take 1 tablet  by mouth 2 (two) times daily. For 10 days     . Ascorbic Acid (VITAMIN C) 1000 MG tablet Take 500 mg by mouth daily.    09/22/2015 at Unknown time  . Cholecalciferol (VITAMIN D-1000 MAX ST) 1000 UNITS tablet Take 1,000 Units by mouth daily.    09/22/2015 at Unknown time  . citalopram (CELEXA) 40 MG tablet Take 40 mg by mouth daily.    09/22/2015 at 0930  . Cyanocobalamin (RA VITAMIN B-12 TR) 1000 MCG TBCR Take 1,000 mcg by mouth daily.    09/22/2015 at Unknown time  . docusate sodium (COLACE) 100 MG capsule Take  100 mg by mouth at bedtime.    09/21/2015 at Unknown time  . erythromycin ophthalmic ointment 1 application at bedtime. Use a small amount on sutures 4 times a day for 10-14 days     . ferrous sulfate 325 (65 FE) MG EC tablet Take 325 mg by mouth at bedtime.    09/21/2015 at Unknown time  . furosemide (LASIX) 20 MG tablet Take 20 mg by mouth daily.    09/22/2015 at 0930  . Multiple Vitamins-Minerals (ONE-A-DAY WOMENS PETITES) TABS Take 2 tablets by mouth every morning.   09/22/2015 at Unknown time  . Omega-3 Fatty Acids (FISH OIL) 1000 MG CAPS Take 1,000 mg by mouth daily.   09/22/2015 at Unknown time  . ondansetron (ZOFRAN-ODT) 4 MG disintegrating tablet Take 4 mg by mouth every 8 (eight) hours as needed for nausea or vomiting.     . ranitidine (ZANTAC) 150 MG tablet Take 1 tablet (150 mg total) by mouth at bedtime. (Patient taking differently: Take 150 mg by mouth 2 (two) times daily. ) 30 tablet 1 09/22/2015 at Unknown time  . simvastatin (ZOCOR) 40 MG tablet Take 40 mg by mouth daily.    09/22/2015 at 0930  . amoxicillin (AMOXIL) 500 MG capsule Take 500 mg by mouth as needed (4 caps 1 hour prior to dental appt). Reported on 02/13/2015   Not Taking at Unknown time   Home medication reconciliation was completed with the patient.   Scheduled Inpatient Medications:   . cefTRIAXone (ROCEPHIN)  IV  1 g Intravenous Q24H  . cholecalciferol  1,000 Units Oral Daily  . citalopram  40 mg Oral Daily  . erythromycin  1 application Both Eyes QHS  . famotidine  20 mg Oral Daily  . ferrous sulfate  325 mg Oral QHS  . heparin  5,000 Units Subcutaneous Q8H  . insulin aspart  0-5 Units Subcutaneous QHS  . insulin aspart  0-9 Units Subcutaneous TID WC  . multivitamin with minerals  1 tablet Oral [id number]  . vitamin B-12  1,000 mcg Oral Daily  . vitamin C  500 mg Oral Daily    Continuous Inpatient Infusions:   . sodium chloride 75 mL/hr at 09/22/15 1900    PRN Inpatient Medications:  ondansetron **OR** ondansetron  (ZOFRAN) IV  Family History: family history includes Heart attack in her brother and mother; Heart attack (age of onset: 82) in her sister; Hyperlipidemia in her sister; Hypertension in her mother and sister.  The patient's family history is negative for inflammatory bowel disorders, GI malignancy, or solid organ transplantation.  Social History:   reports that she has quit smoking. Her smoking use included Cigarettes. She has a 3.75 pack-year smoking history. She has never used smokeless tobacco. She reports that she does not drink alcohol or use drugs. The patient denies ETOH, tobacco, or drug use.   Review  of Systems: Constitutional: Weight is stable.  Eyes: No changes in vision. ENT: No oral lesions, sore throat.  GI: see HPI.  Heme/Lymph: No easy bruising.  CV: No chest pain.  GU: No hematuria.  Integumentary: No rashes.  Neuro: No headaches.  Psych: No depression/anxiety.  Endocrine: No heat/cold intolerance.  Allergic/Immunologic: No urticaria.  Resp: No cough, SOB.  Musculoskeletal: No joint swelling.    Physical Examination: BP 111/89 (BP Location: Right Arm)   Pulse 73   Temp 98.3 F (36.8 C) (Oral)   Resp 20   Ht 5\' 3"  (1.6 m)   Wt 93 kg (205 lb)   SpO2 96%   BMI 36.31 kg/m  Gen: NAD, alert and oriented x 4 HEENT: PEERLA, EOMI, Neck: supple, no JVD or thyromegaly Chest: CTA bilaterally, no wheezes, crackles, or other adventitious sounds CV: RRR, no m/g/c/r Abd: soft, minimal tenderness in RUQ area, no palpable masses, no abnormal percussion findings. Ext: no edema, well perfused with 2+ pulses, Skin: no rash or lesions noted Lymph: no LAD  Data: Lab Results  Component Value Date   WBC 6.2 09/23/2015   HGB 12.1 09/23/2015   HCT 34.6 (L) 09/23/2015   MCV 95.4 09/23/2015   PLT 136 (L) 09/23/2015    Recent Labs Lab 09/20/15 2200 09/22/15 1212 09/23/15 0534  HGB 14.3 12.9 12.1   Lab Results  Component Value Date   NA 136 09/23/2015   K 3.4 (L)  09/23/2015   CL 105 09/23/2015   CO2 23 09/23/2015   BUN 14 09/23/2015   CREATININE 0.91 09/23/2015   Lab Results  Component Value Date   ALT 348 (H) 09/23/2015   AST 186 (H) 09/23/2015   ALKPHOS 184 (H) 09/23/2015   BILITOT 2.6 (H) 09/23/2015    Recent Labs Lab 09/23/15 0534  INR 0.96   Assessment/Plan: Ms. Traina is a 80 y.o. female who likely had a CBD stone with obstruction and rapid rise in LFT's and the stone has passed and LFT's are rapidly falling and pain is gone, a little tenderness remains.  I think she can go home and follow up my office Tuesday if she needs any further follow up.  I spoke to Dr. Bridgett Larsson about plans.  Recommendations:  Thank you for the consult. Please call with questions or concerns.  Gaylyn Cheers, MD

## 2015-09-23 NOTE — Discharge Instructions (Addendum)
Heart healthy and ADA diet. Activity as tolerated. Follow-up liver function test and urine culture with Dr. Tiffany Kocher and PCP. Please contact you doctor if you should have any abdominal pain, nausea, vomiting, fever, if you notice any yellowing of your skin or the white parts of your eyes (jaundice).  Also call your doctor with any questions or concerns.

## 2015-09-23 NOTE — Discharge Summary (Signed)
[facility] NAME: Jenna [name]    MR#:  [id number]  DATE OF BIRTH:  Sep 10, 1935  DATE OF ADMISSION:  09/22/2015   ADMITTING PHYSICIAN: Lytle Butte, MD  DATE OF DISCHARGE: 09/23/2015 12:50 PM  PRIMARY CARE PHYSICIAN: FELDPAUSCH, DALE E, MD   ADMISSION DIAGNOSIS:  Hyperbilirubinemia [E80.6] Acute hepatitis [K72.00] UTI (lower urinary tract infection) [N39.0] DISCHARGE DIAGNOSIS:  Active Problems:   Hepatic failure, acute UTI SECONDARY DIAGNOSIS:   Past Medical History:  Diagnosis Date  . Arthritis    neck, knees(before replacements)  . Asthma   . COPD (chronic obstructive pulmonary disease) ([place])   . Depression   . Diabetes mellitus without complication ([place])    resolved with gastric bypass  . Encounter for colonoscopy due to history of adenomatous colonic polyps   . GERD (gastroesophageal reflux disease)   . Hearing loss of both ears   . Hyperlipidemia   . Hypertension   . Palpitations   . Sleep apnea    resolved with gastric bypass  . Syncope and collapse    HOSPITAL COURSE:  80 year old Caucasian female history hyperlipidemia, prior cholecystectomy, gastric bypass surgery who is presenting with abnormal blood work  1. Acute liver failure:  Negative hepatitis, follow-up EBV, CMV, ANA and anti-smooth muscle antibody. normal INR. Per Dr. Tiffany Kocher, likely had a CBD stone with obstruction and rapid rise in LFT's and the stone has passed and LFT's are rapidly falling and pain is gone, a little tenderness remains.  She can go home and follow up his office Tuesday if she needs any further follow up.    2. Hyperlipidemia unspecified: hold Statin. 3. UTI: she was on Ceftriaxone, changed to Cipro by mouth twice a day and follow culture results patient. Discussed with Dr. Tiffany Kocher, suggested that the patient can be discharged to home today and a follow-up hematocrit outpatient. DISCHARGE CONDITIONS:  Stable, discharged to home  today. CONSULTS OBTAINED:  Treatment Team:  Lytle Butte, MD Manya Silvas, MD DRUG ALLERGIES:   Allergies  Allergen Reactions  . Codeine Other (See Comments)    "will not stay down"  . Hydromorphone Other (See Comments)    confusion, personality change  . Morphine Other (See Comments)    Goes crazy  . Nsaids Other (See Comments)    Avoids because of gastric bypass surgery  . Oxycodone Nausea And Vomiting  . Oxycodone-Acetaminophen Nausea And Vomiting  . Nabumetone Rash  . Promethazine Hcl Rash and Other (See Comments)   DISCHARGE MEDICATIONS:     Medication List    STOP taking these medications   amoxicillin 500 MG capsule Commonly known as:  AMOXIL   amoxicillin-clavulanate 875-125 MG tablet Commonly known as:  AUGMENTIN     TAKE these medications   albuterol 108 (90 Base) MCG/ACT inhaler Commonly known as:  PROVENTIL HFA;VENTOLIN HFA Inhale 2 puffs into the lungs every 6 (six) hours as needed for wheezing or shortness of breath.   ciprofloxacin 500 MG tablet Commonly known as:  CIPRO Take 1 tablet (500 mg total) by mouth 2 (two) times daily.   citalopram 40 MG tablet Commonly known as:  CELEXA Take 40 mg by mouth daily.   docusate sodium 100 MG capsule Commonly known as:  COLACE Take 100 mg by mouth at bedtime.   erythromycin ophthalmic ointment 1 application at bedtime. Use a small amount on sutures 4 times a day for 10-14 days   ferrous sulfate 325 (65  FE) MG EC tablet Take 325 mg by mouth at bedtime.   Fish Oil 1000 MG Caps Take 1,000 mg by mouth daily.   furosemide 20 MG tablet Commonly known as:  LASIX Take 20 mg by mouth daily.   ondansetron 4 MG disintegrating tablet Commonly known as:  ZOFRAN-ODT Take 4 mg by mouth every 8 (eight) hours as needed for nausea or vomiting.   ONE-A-DAY WOMENS PETITES Tabs Take 2 tablets by mouth every morning.   RA VITAMIN B-12 TR 1000 MCG Tbcr Generic drug:  Cyanocobalamin Take 1,000 mcg by mouth  daily.   ranitidine 150 MG tablet Commonly known as:  ZANTAC Take 1 tablet (150 mg total) by mouth at bedtime. What changed:  when to take this   simvastatin 40 MG tablet Commonly known as:  ZOCOR Take 40 mg by mouth daily.   vitamin C 1000 MG tablet Take 500 mg by mouth daily.   VITAMIN D-1000 MAX ST 1000 units tablet Generic drug:  Cholecalciferol Take 1,000 Units by mouth daily.        DISCHARGE INSTRUCTIONS:   DIET:  Heart healthy diet DISCHARGE CONDITION:  Stable ACTIVITY:  As tolerated DISCHARGE LOCATION:    If you experience worsening of your admission symptoms, develop shortness of breath, life threatening emergency, suicidal or homicidal thoughts you must seek medical attention immediately by calling 911 or calling your MD immediately  if symptoms less severe.  You Must read complete instructions/literature along with all the possible adverse reactions/side effects for all the Medicines you take and that have been prescribed to you. Take any new Medicines after you have completely understood and accpet all the possible adverse reactions/side effects.   Please note  You were cared for by a hospitalist during your hospital stay. If you have any questions about your discharge medications or the care you received while you were in the hospital after you are discharged, you can call the unit and asked to speak with the hospitalist on call if the hospitalist that took care of you is not available. Once you are discharged, your primary care physician will handle any further medical issues. Please note that NO REFILLS for any discharge medications will be authorized once you are discharged, as it is imperative that you return to your primary care physician (or establish a relationship with a primary care physician if you do not have one) for your aftercare needs so that they can reassess your need for medications and monitor your lab values.    On the day of Discharge:    VITAL SIGNS:  Blood pressure 111/89, pulse 73, temperature 98.3 F (36.8 C), temperature source Oral, resp. rate 20, height 5\' 3"  (1.6 m), weight 205 lb (93 kg), SpO2 96 %. PHYSICAL EXAMINATION:  GENERAL:  80 y.o.-year-old patient lying in the bed with no acute distress.  EYES: Pupils equal, round, reactive to light and accommodation. No scleral icterus. Extraocular muscles intact.  HEENT: Head atraumatic, normocephalic. Oropharynx and nasopharynx clear.  NECK:  Supple, no jugular venous distention. No thyroid enlargement, no tenderness.  LUNGS: Normal breath sounds bilaterally, no wheezing, rales,rhonchi or crepitation. No use of accessory muscles of respiration.  CARDIOVASCULAR: S1, S2 normal. No murmurs, rubs, or gallops.  ABDOMEN: Soft, non-tender, non-distended. Bowel sounds present. No organomegaly or mass.  EXTREMITIES: No pedal edema, cyanosis, or clubbing.  NEUROLOGIC: Cranial nerves II through XII are intact. Muscle strength 5/5 in all extremities. Sensation intact. Gait not checked.  PSYCHIATRIC: The patient is alert  and oriented x 3.  SKIN: No obvious rash, lesion, or ulcer.  DATA REVIEW:   CBC  Recent Labs Lab 09/23/15 0534  WBC 6.2  HGB 12.1  HCT 34.6*  PLT 136*    Chemistries   Recent Labs Lab 09/23/15 0937  NA 136  K 3.4*  CL 105  CO2 23  GLUCOSE 138*  BUN 14  CREATININE 0.91  CALCIUM 8.2*  AST 186*  ALT 348*  ALKPHOS 184*  BILITOT 2.6*     Microbiology Results  Results for orders placed or performed during the hospital encounter of 09/22/15  Urine culture     Status: None   Collection Time: 09/22/15 12:14 PM  Result Value Ref Range Status   Specimen Description URINE, CLEAN CATCH  Final   Special Requests Normal  Final   Culture NO GROWTH Performed at [facility]   Final   Report Status 09/23/2015 FINAL  Final    RADIOLOGY:  Ct Abdomen Pelvis W Contrast  Result Date: 09/22/2015 CLINICAL DATA:  Pancreatitis, dysuria and  vomiting. EXAM: CT ABDOMEN AND PELVIS WITH CONTRAST TECHNIQUE: Multidetector CT imaging of the abdomen and pelvis was performed using the standard protocol following bolus administration of intravenous contrast. CONTRAST:  132mL ISOVUE-300 IOPAMIDOL (ISOVUE-300) INJECTION 61% COMPARISON:  1/20/ 9 FINDINGS: Lower chest:  No acute findings. Hepatobiliary: There is no focal liver abnormality identified. The patient is status post cholecystectomy. The common bile duct is mildly increased in caliber measuring 7 mm. No obstructing stone identified. Pancreas: No mass, inflammatory changes, or other significant abnormality. No significant peripancreatic fluid or fluid collections. Spleen: Within normal limits in size and appearance. Adrenals/Urinary Tract: Normal appearance of the adrenal glands. The kidneys are unremarkable. No mass or hydronephrosis identified. The urinary bladder is unremarkable. Stomach/Bowel: Postoperative changes involving the stomach identified compatible with previous gastric bypass surgery. The small bowel loops have a normal caliber. No pathologic dilatation of the colon identified. Vascular/Lymphatic: Calcified atherosclerotic disease involves the abdominal aorta. No aneurysm. No enlarged retroperitoneal or mesenteric adenopathy. No enlarged pelvic or inguinal lymph nodes. Reproductive: No mass or other significant abnormality. Other: None. Musculoskeletal: No suspicious bone lesions identified. L4-5 and L5-S1 degenerative disc disease noted. Facet hypertrophy and degenerative change noted. IMPRESSION: 1. No acute findings within the abdomen or pelvis. No complications of pancreatitis identified. Specifically there is no significant upper abdominal free fluid and no fluid collections identified to suggest pseudocyst. 2. Mild increase caliber of the CBD status post cholecystectomy 3. Aortic atherosclerosis 4. Lumbar degenerative disc disease. Electronically Signed   By: Kerby Moors M.D.   On:  09/22/2015 15:32     Management plans discussed with the patient, family and they are in agreement.  CODE STATUS:     Code Status Orders        Start     Ordered   09/22/15 1617  Full code  Continuous     09/22/15 1617    Code Status History    Date Active Date Inactive Code Status Order ID Comments User Context   06/13/2014  5:23 AM 06/14/2014  5:50 PM Full Code [id number]  Harrie Foreman, MD Inpatient      TOTAL TIME TAKING CARE OF THIS PATIENT: 33 minutes.    Demetrios Loll M.D on 09/23/2015 at 2:35 PM  Between 7am to 6pm - Pager - [phone]  After 6pm go to [email or web address] - [facility]  Office  [phone]  CC: Primary care  physician; [facility], Chrissie Noa, MD   Note: This dictation was prepared with Dragon dictation along with smaller phrase technology. Any transcriptional errors that result from this process are unintentional.

## 2015-09-24 LAB — ANTI-SMOOTH MUSCLE ANTIBODY, IGG: F-Actin IgG: 6 Units (ref 0–19)

## 2015-09-26 LAB — EPSTEIN-BARR VIRUS VCA ANTIBODY PANEL
EBV Early Antigen Ab, IgG: <9 U/mL (ref 0.0–8.9)
EBV NA IgG: 532 U/mL — ABNORMAL HIGH (ref 0.0–17.9)
EBV VCA IgG: >600 U/mL — ABNORMAL HIGH (ref 0.0–17.9)
EBV VCA IgM: <36 U/mL (ref 0.0–35.9)

## 2016-06-16 ENCOUNTER — Emergency Department: Payer: Medicare Other

## 2016-06-16 ENCOUNTER — Observation Stay: Payer: Medicare Other

## 2016-06-16 ENCOUNTER — Inpatient Hospital Stay
Admission: EM | Admit: 2016-06-16 | Discharge: 2016-06-23 | DRG: 190 | Disposition: A | Discharge status: Home / Self Care | Payer: Medicare Other | Attending: Internal Medicine | Admitting: Internal Medicine

## 2016-06-16 ENCOUNTER — Encounter: Payer: Self-pay | Admitting: *Deleted

## 2016-06-16 DIAGNOSIS — Z888 Allergy status to other drugs, medicaments and biological substances status: Secondary | ICD-10-CM

## 2016-06-16 DIAGNOSIS — J9601 Acute respiratory failure with hypoxia: Secondary | ICD-10-CM | POA: Diagnosis present

## 2016-06-16 DIAGNOSIS — E876 Hypokalemia: Secondary | ICD-10-CM | POA: Diagnosis present

## 2016-06-16 DIAGNOSIS — G4733 Obstructive sleep apnea (adult) (pediatric): Secondary | ICD-10-CM | POA: Diagnosis present

## 2016-06-16 DIAGNOSIS — Z90710 Acquired absence of both cervix and uterus: Secondary | ICD-10-CM

## 2016-06-16 DIAGNOSIS — Z87891 Personal history of nicotine dependence: Secondary | ICD-10-CM

## 2016-06-16 DIAGNOSIS — J440 Chronic obstructive pulmonary disease with acute lower respiratory infection: Secondary | ICD-10-CM | POA: Diagnosis present

## 2016-06-16 DIAGNOSIS — J45901 Unspecified asthma with (acute) exacerbation: Secondary | ICD-10-CM | POA: Diagnosis present

## 2016-06-16 DIAGNOSIS — H9193 Unspecified hearing loss, bilateral: Secondary | ICD-10-CM | POA: Diagnosis present

## 2016-06-16 DIAGNOSIS — R0602 Shortness of breath: Secondary | ICD-10-CM

## 2016-06-16 DIAGNOSIS — I10 Essential (primary) hypertension: Secondary | ICD-10-CM | POA: Diagnosis present

## 2016-06-16 DIAGNOSIS — Z9049 Acquired absence of other specified parts of digestive tract: Secondary | ICD-10-CM

## 2016-06-16 DIAGNOSIS — Z9884 Bariatric surgery status: Secondary | ICD-10-CM

## 2016-06-16 DIAGNOSIS — E785 Hyperlipidemia, unspecified: Secondary | ICD-10-CM | POA: Diagnosis present

## 2016-06-16 DIAGNOSIS — Z79899 Other long term (current) drug therapy: Secondary | ICD-10-CM

## 2016-06-16 DIAGNOSIS — Z8249 Family history of ischemic heart disease and other diseases of the circulatory system: Secondary | ICD-10-CM

## 2016-06-16 DIAGNOSIS — M47812 Spondylosis without myelopathy or radiculopathy, cervical region: Secondary | ICD-10-CM | POA: Diagnosis present

## 2016-06-16 DIAGNOSIS — J441 Chronic obstructive pulmonary disease with (acute) exacerbation: Secondary | ICD-10-CM | POA: Diagnosis not present

## 2016-06-16 DIAGNOSIS — T380X5A Adverse effect of glucocorticoids and synthetic analogues, initial encounter: Secondary | ICD-10-CM | POA: Diagnosis present

## 2016-06-16 DIAGNOSIS — Z96653 Presence of artificial knee joint, bilateral: Secondary | ICD-10-CM | POA: Diagnosis present

## 2016-06-16 DIAGNOSIS — J209 Acute bronchitis, unspecified: Secondary | ICD-10-CM | POA: Diagnosis present

## 2016-06-16 DIAGNOSIS — Z885 Allergy status to narcotic agent status: Secondary | ICD-10-CM

## 2016-06-16 DIAGNOSIS — Z6839 Body mass index (BMI) 39.0-39.9, adult: Secondary | ICD-10-CM

## 2016-06-16 DIAGNOSIS — E669 Obesity, unspecified: Secondary | ICD-10-CM | POA: Diagnosis present

## 2016-06-16 DIAGNOSIS — F329 Major depressive disorder, single episode, unspecified: Secondary | ICD-10-CM | POA: Diagnosis present

## 2016-06-16 DIAGNOSIS — E1165 Type 2 diabetes mellitus with hyperglycemia: Secondary | ICD-10-CM | POA: Diagnosis present

## 2016-06-16 DIAGNOSIS — K219 Gastro-esophageal reflux disease without esophagitis: Secondary | ICD-10-CM | POA: Diagnosis present

## 2016-06-16 LAB — BASIC METABOLIC PANEL
Anion gap: 9 (ref 5–15)
BUN: 14 mg/dL (ref 6–20)
CO2: 26 mmol/L (ref 22–32)
Calcium: 8.6 mg/dL — ABNORMAL LOW (ref 8.9–10.3)
Chloride: 106 mmol/L (ref 101–111)
Creatinine, Ser: 0.73 mg/dL (ref 0.44–1.00)
GFR calc Af Amer: >60 mL/min (ref >60)
GFR calc non Af Amer: >60 mL/min (ref >60)
Glucose, Bld: 150 mg/dL — ABNORMAL HIGH (ref 65–99)
Potassium: 3 mmol/L — ABNORMAL LOW (ref 3.5–5.1)
Sodium: 141 mmol/L (ref 135–145)

## 2016-06-16 LAB — PHOSPHORUS: Phosphorus: 2.8 mg/dL (ref 2.5–4.6)

## 2016-06-16 LAB — CBC
HCT: 36.7 % (ref 35.0–47.0)
Hemoglobin: 12.7 g/dL (ref 12.0–16.0)
MCH: 33.3 pg (ref 26.0–34.0)
MCHC: 34.7 g/dL (ref 32.0–36.0)
MCV: 96.1 fL (ref 80.0–100.0)
Platelets: 188 10*3/uL (ref 150–440)
RBC: 3.82 MIL/uL (ref 3.80–5.20)
RDW: 12.8 % (ref 11.5–14.5)
WBC: 7 10*3/uL (ref 3.6–11.0)

## 2016-06-16 LAB — FIBRIN DERIVATIVES D-DIMER (ARMC ONLY): Fibrin derivatives D-dimer (ARMC): 1375.11 — ABNORMAL HIGH (ref 0.00–499.00)

## 2016-06-16 LAB — TROPONIN I: Troponin I: <0.03 ng/mL (ref <0.03)

## 2016-06-16 LAB — MAGNESIUM: Magnesium: 1.4 mg/dL — ABNORMAL LOW (ref 1.7–2.4)

## 2016-06-16 MED ORDER — FUROSEMIDE 20 MG PO TABS
20.0000 mg | ORAL_TABLET | Freq: Every day | ORAL | Status: DC
  Administered 2016-06-17 – 2016-06-23 (×7): 20 mg via ORAL
  Filled 2016-06-16 (×7): qty 1

## 2016-06-16 MED ORDER — ALBUTEROL SULFATE (2.5 MG/3ML) 0.083% IN NEBU
INHALATION_SOLUTION | RESPIRATORY_TRACT | Status: AC
  Administered 2016-06-16: 2.5 mg via RESPIRATORY_TRACT
  Filled 2016-06-16: qty 3

## 2016-06-16 MED ORDER — ALBUTEROL SULFATE (2.5 MG/3ML) 0.083% IN NEBU
2.5000 mg | INHALATION_SOLUTION | Freq: Four times a day (QID) | RESPIRATORY_TRACT | Status: DC | PRN
  Administered 2016-06-17: 2.5 mg via RESPIRATORY_TRACT
  Filled 2016-06-16: qty 3

## 2016-06-16 MED ORDER — SODIUM CHLORIDE 0.9% FLUSH: 3.0000 mL | INTRAVENOUS | Status: DC | PRN

## 2016-06-16 MED ORDER — CITALOPRAM HYDROBROMIDE 20 MG PO TABS
40.0000 mg | ORAL_TABLET | Freq: Every day | ORAL | Status: DC
  Administered 2016-06-17 – 2016-06-23 (×7): 40 mg via ORAL
  Filled 2016-06-16 (×7): qty 2

## 2016-06-16 MED ORDER — BENZONATATE 100 MG PO CAPS
100.0000 mg | ORAL_CAPSULE | Freq: Three times a day (TID) | ORAL | Status: DC | PRN
Start: 2016-06-16 — End: 2016-06-17
  Administered 2016-06-17: 100 mg via ORAL
  Filled 2016-06-16: qty 1

## 2016-06-16 MED ORDER — SODIUM CHLORIDE 0.9 % IV SOLN
250.0000 mL | INTRAVENOUS | Status: DC | PRN
Start: 2016-06-16 — End: 2016-06-23

## 2016-06-16 MED ORDER — SIMVASTATIN 40 MG PO TABS
40.0000 mg | ORAL_TABLET | Freq: Every day | ORAL | Status: DC
  Administered 2016-06-17 – 2016-06-23 (×7): 40 mg via ORAL
  Filled 2016-06-16 (×7): qty 1

## 2016-06-16 MED ORDER — METHYLPREDNISOLONE SODIUM SUCC 125 MG IJ SOLR
125.0000 mg | INTRAMUSCULAR | Status: AC
  Administered 2016-06-16: 125 mg via INTRAVENOUS
  Filled 2016-06-16: qty 2

## 2016-06-16 MED ORDER — BISACODYL 5 MG PO TBEC: 5.0000 mg | DELAYED_RELEASE_TABLET | Freq: Every day | ORAL | Status: DC | PRN

## 2016-06-16 MED ORDER — ENOXAPARIN SODIUM 40 MG/0.4ML ~~LOC~~ SOLN
40.0000 mg | SUBCUTANEOUS | Status: DC
  Administered 2016-06-17 – 2016-06-22 (×6): 40 mg via SUBCUTANEOUS
  Filled 2016-06-16 (×6): qty 0.4

## 2016-06-16 MED ORDER — CEFUROXIME AXETIL 500 MG PO TABS
500.0000 mg | ORAL_TABLET | Freq: Two times a day (BID) | ORAL | Status: DC
  Administered 2016-06-16 – 2016-06-19 (×6): 500 mg via ORAL
  Filled 2016-06-16 (×6): qty 1

## 2016-06-16 MED ORDER — ONDANSETRON HCL 4 MG PO TABS: 4.0000 mg | ORAL_TABLET | Freq: Four times a day (QID) | ORAL | Status: DC | PRN

## 2016-06-16 MED ORDER — FERROUS SULFATE 325 (65 FE) MG PO TABS
325.0000 mg | ORAL_TABLET | Freq: Every day | ORAL | Status: DC
  Administered 2016-06-16 – 2016-06-22 (×7): 325 mg via ORAL
  Filled 2016-06-16 (×7): qty 1

## 2016-06-16 MED ORDER — ORAL CARE MOUTH RINSE
15.0000 mL | Freq: Two times a day (BID) | OROMUCOSAL | Status: DC
  Administered 2016-06-18 – 2016-06-23 (×6): 15 mL via OROMUCOSAL

## 2016-06-16 MED ORDER — VITAMIN B-12 1000 MCG PO TABS
1000.0000 ug | ORAL_TABLET | Freq: Every day | ORAL | Status: DC
  Administered 2016-06-17 – 2016-06-23 (×7): 1000 ug via ORAL
  Filled 2016-06-16 (×7): qty 1

## 2016-06-16 MED ORDER — ADULT MULTIVITAMIN W/MINERALS CH
2.0000 | ORAL_TABLET | ORAL | Status: DC
  Administered 2016-06-17 – 2016-06-23 (×7): 2 via ORAL
  Filled 2016-06-16 (×9): qty 2

## 2016-06-16 MED ORDER — ONDANSETRON HCL 4 MG/2ML IJ SOLN: 4.0000 mg | Freq: Four times a day (QID) | INTRAMUSCULAR | Status: DC | PRN

## 2016-06-16 MED ORDER — IPRATROPIUM-ALBUTEROL 0.5-2.5 (3) MG/3ML IN SOLN
RESPIRATORY_TRACT | Status: AC
  Administered 2016-06-16: 3 mL via RESPIRATORY_TRACT
  Filled 2016-06-16: qty 3

## 2016-06-16 MED ORDER — ALBUTEROL SULFATE (2.5 MG/3ML) 0.083% IN NEBU
5.0000 mg | INHALATION_SOLUTION | Freq: Once | RESPIRATORY_TRACT | Status: AC
  Administered 2016-06-16: 5 mg via RESPIRATORY_TRACT

## 2016-06-16 MED ORDER — FAMOTIDINE 20 MG PO TABS
20.0000 mg | ORAL_TABLET | Freq: Two times a day (BID) | ORAL | Status: DC
  Administered 2016-06-16 – 2016-06-23 (×14): 20 mg via ORAL
  Filled 2016-06-16 (×14): qty 1

## 2016-06-16 MED ORDER — POTASSIUM CHLORIDE CRYS ER 20 MEQ PO TBCR
40.0000 meq | EXTENDED_RELEASE_TABLET | Freq: Two times a day (BID) | ORAL | Status: AC
  Administered 2016-06-16 – 2016-06-17 (×2): 40 meq via ORAL
  Filled 2016-06-16 (×2): qty 2

## 2016-06-16 MED ORDER — SENNOSIDES-DOCUSATE SODIUM 8.6-50 MG PO TABS: 1.0000 | ORAL_TABLET | Freq: Every evening | ORAL | Status: DC | PRN

## 2016-06-16 MED ORDER — MAGNESIUM CITRATE PO SOLN
1.0000 | Freq: Once | ORAL | Status: DC | PRN
  Filled 2016-06-16: qty 296

## 2016-06-16 MED ORDER — METHYLPREDNISOLONE SODIUM SUCC 125 MG IJ SOLR
60.0000 mg | Freq: Four times a day (QID) | INTRAMUSCULAR | Status: DC
  Administered 2016-06-16 – 2016-06-18 (×6): 60 mg via INTRAVENOUS
  Filled 2016-06-16 (×6): qty 2

## 2016-06-16 MED ORDER — IPRATROPIUM BROMIDE 0.02 % IN SOLN: 0.5000 mg | Freq: Four times a day (QID) | RESPIRATORY_TRACT | Status: DC | PRN

## 2016-06-16 MED ORDER — ACETAMINOPHEN 650 MG RE SUPP: 650.0000 mg | Freq: Four times a day (QID) | RECTAL | Status: DC | PRN

## 2016-06-16 MED ORDER — IPRATROPIUM-ALBUTEROL 0.5-2.5 (3) MG/3ML IN SOLN
3.0000 mL | Freq: Once | RESPIRATORY_TRACT | Status: AC
  Administered 2016-06-16 (×2): 3 mL via RESPIRATORY_TRACT
  Filled 2016-06-16: qty 3

## 2016-06-16 MED ORDER — VITAMIN D 1000 UNITS PO TABS
1000.0000 [IU] | ORAL_TABLET | Freq: Every day | ORAL | Status: DC
  Administered 2016-06-17 – 2016-06-23 (×7): 1000 [IU] via ORAL
  Filled 2016-06-16 (×7): qty 1

## 2016-06-16 MED ORDER — SODIUM CHLORIDE 0.9% FLUSH
3.0000 mL | Freq: Two times a day (BID) | INTRAVENOUS | Status: DC
  Administered 2016-06-16 – 2016-06-23 (×13): 3 mL via INTRAVENOUS

## 2016-06-16 MED ORDER — ACETAMINOPHEN 325 MG PO TABS
650.0000 mg | ORAL_TABLET | Freq: Four times a day (QID) | ORAL | Status: DC | PRN
  Administered 2016-06-22: 650 mg via ORAL
  Filled 2016-06-16: qty 2

## 2016-06-16 MED ORDER — VITAMIN C 500 MG PO TABS
500.0000 mg | ORAL_TABLET | Freq: Every day | ORAL | Status: DC
  Administered 2016-06-17 – 2016-06-23 (×7): 500 mg via ORAL
  Filled 2016-06-16 (×7): qty 1

## 2016-06-16 MED ORDER — ALBUTEROL SULFATE (2.5 MG/3ML) 0.083% IN NEBU
2.5000 mg | INHALATION_SOLUTION | RESPIRATORY_TRACT | Status: AC
  Administered 2016-06-16 (×2): 2.5 mg via RESPIRATORY_TRACT
  Filled 2016-06-16 (×2): qty 3

## 2016-06-16 NOTE — ED Notes (Addendum)
Pharmacy called to send albuterol - none in pyxis

## 2016-06-16 NOTE — ED Provider Notes (Signed)
[facility] Emergency Department Provider Note ____________________________________________   None    (approximate)  I have reviewed the triage vital signs and the nursing notes.   HISTORY  Chief Complaint Shortness of Breath    HPI Jenna [name] is a 81 y.o. female history of COPD previous gastric bypass.  Patient reports that she came back from [place] after visiting friends, down there she began to have some slight wheezing. Bedtime she did have a vasculitis she is having significant increase in wheezing and dry nonproductive cough. She reports her symptoms are "COPD". She attempted to see her regular doctor, but was unable to and saw a different physician on Thursday. She is placed on cefdinir and reports that despite using inhalers, she has been worsening the point she was having severe difficulty breathing  She reports she is wheezing. No pain.  She reports her symptoms are beginning to get better after care of paramedics. She received 2 DuoNeb, and a racemic epi with EMS.EMS reports the patient's oxygen saturation was in the 80s on their arrival.   Past Medical History:  Diagnosis Date  . Arthritis    neck, knees(before replacements)  . Asthma   . COPD (chronic obstructive pulmonary disease) ([place])   . Depression   . Diabetes mellitus without complication ([place])    resolved with gastric bypass  . Encounter for colonoscopy due to history of adenomatous colonic polyps   . GERD (gastroesophageal reflux disease)   . Hearing loss of both ears   . Hyperlipidemia   . Hypertension   . Palpitations   . Sleep apnea    resolved with gastric bypass  . Syncope and collapse     Patient Active Problem List   Diagnosis Date Noted  . Hepatic failure, acute 09/22/2015  . Morbid obesity ([place]) 11/13/2013  . Hyperlipidemia 11/13/2013  . Esophageal reflux 11/13/2013  . Bilateral leg edema 11/13/2013  . Chronic cough 11/13/2013    Past Surgical  History:  Procedure Laterality Date  . BROW LIFT Bilateral 06/27/2015   Procedure: BLEPHAROPLASTY BILATERAL UPPER EYELIDS BILATERAL BLEPHAROTOSIS EYELIDS;  Surgeon: Karle Starch, MD;  Location: [facility];  Service: Ophthalmology;  Laterality: Bilateral;  BILATERAL  . CATARACT EXTRACTION W/PHACO Right 02/13/2015   Procedure: CATARACT EXTRACTION PHACO AND INTRAOCULAR LENS PLACEMENT (IOC);  Surgeon: Ronnell Freshwater, MD;  Location: [facility];  Service: Ophthalmology;  Laterality: Right;  . CATARACT EXTRACTION W/PHACO Left 03/22/2015   Procedure: CATARACT EXTRACTION PHACO AND INTRAOCULAR LENS PLACEMENT (IOC);  Surgeon: Ronnell Freshwater, MD;  Location: [facility];  Service: Ophthalmology;  Laterality: Left;  TORIC  . CHOLECYSTECTOMY  1984  . COLECTOMY    . COSMETIC SURGERY  2012   tummy tuck and excess skin removal  . GASTRIC BYPASS  2010  . REPLACEMENT TOTAL KNEE BILATERAL  1998  . SHOULDER SURGERY  2009   left   . TONSILLECTOMY    . TOTAL ABDOMINAL HYSTERECTOMY  1979    Prior to Admission medications   Medication Sig Start Date End Date Taking? Authorizing Provider  albuterol (PROVENTIL HFA;VENTOLIN HFA) 108 (90 BASE) MCG/ACT inhaler Inhale 2 puffs into the lungs every 6 (six) hours as needed for wheezing or shortness of breath. 06/14/14   Demetrios Loll, MD  Ascorbic Acid (VITAMIN C) 1000 MG tablet Take 500 mg by mouth daily.     [provider]  Cholecalciferol (VITAMIN D-1000 MAX ST) 1000 UNITS tablet Take 1,000 Units by mouth daily.  [provider]  ciprofloxacin (CIPRO) 500 MG tablet Take 1 tablet (500 mg total) by mouth 2 (two) times daily. 09/23/15   Demetrios Loll, MD  citalopram (CELEXA) 40 MG tablet Take 40 mg by mouth daily.     [provider]  Cyanocobalamin (RA VITAMIN B-12 TR) 1000 MCG TBCR Take 1,000 mcg by mouth daily.     [provider]  docusate sodium (COLACE) 100 MG capsule Take 100 mg by mouth at  bedtime.     [provider]  erythromycin ophthalmic ointment 1 application at bedtime. Use a small amount on sutures 4 times a day for 10-14 days    [provider]  ferrous sulfate 325 (65 FE) MG EC tablet Take 325 mg by mouth at bedtime.     [provider]  furosemide (LASIX) 20 MG tablet Take 20 mg by mouth daily.  09/28/13   [provider]  Multiple Vitamins-Minerals (ONE-A-DAY WOMENS PETITES) TABS Take 2 tablets by mouth every morning.    [provider]  Omega-3 Fatty Acids (FISH OIL) 1000 MG CAPS Take 1,000 mg by mouth daily.    [provider]  ondansetron (ZOFRAN-ODT) 4 MG disintegrating tablet Take 4 mg by mouth every 8 (eight) hours as needed for nausea or vomiting.    [provider]  ranitidine (ZANTAC) 150 MG tablet Take 1 tablet (150 mg total) by mouth at bedtime. Patient taking differently: Take 150 mg by mouth 2 (two) times daily.  09/21/15 09/20/16  Daymon Larsen, MD  simvastatin (ZOCOR) 40 MG tablet Take 40 mg by mouth daily.     [provider]    Allergies Codeine; Hydromorphone; Morphine; Nsaids; Oxycodone; Oxycodone-acetaminophen; Nabumetone; and Promethazine hcl  Family History  Problem Relation Age of Onset  . Heart attack Mother   . Hypertension Mother   . Heart attack Brother   . Heart attack Sister 68  . Hyperlipidemia Sister   . Hypertension Sister     Social History Social History  Substance Use Topics  . Smoking status: Former Smoker    Packs/day: 0.25    Years: 15.00    Types: Cigarettes  . Smokeless tobacco: Never Used  . Alcohol use No    Review of Systems Constitutional: No fever/chills Eyes: No visual changes. ENT: No sore throat. Cardiovascular: Denies chest pain. Respiratory: See history of present illness Gastrointestinal: No abdominal pain.  No nausea, no vomiting.  No diarrhea.  No constipation. Reports she has began gaining weight over the last  year. Genitourinary: Negative for dysuria. Musculoskeletal: Negative for back pain. Skin: Negative for rash. Neurological: Negative for headaches, focal weakness or numbness.    ____________________________________________   PHYSICAL EXAM:  VITAL SIGNS: ED Triage Vitals  Enc Vitals Group     BP 06/16/16 1915 (!) 152/81     Pulse Rate 06/16/16 1915 (!) 109     Resp 06/16/16 1915 (!) 25     Temp 06/16/16 1915 98.1 F (36.7 C)     Temp Source 06/16/16 1915 Axillary     SpO2 06/16/16 1915 95 %     Weight 06/16/16 1911 214 lb 8.1 oz (97.3 kg)     Height 06/16/16 1911 5\' 2"  (1.575 m)     Head Circumference --      Peak Flow --      Pain Score 06/16/16 1910 2     Pain Loc --      Pain Edu? --  Excl. in [place]? --     Constitutional: Alert and oriented. Moderately dyspneic. Seated upright. Able to speak in short phrases Eyes: Conjunctivae are normal. Head: Atraumatic. Nose: No congestion/rhinnorhea. Mouth/Throat: Mucous membranes are moist. Neck: No stridor.   Cardiovascular: Tachycardic rate, regular rhythm. Grossly normal heart sounds.  Good peripheral circulation. Respiratory: Moderate increased work of breathing. Tachypnea. Diffuse end expiratory wheezing. Overall good expansion of the chest. Does not appear to be tripoding or in acute extremities. Moderately successful muscles Gastrointestinal: Soft and nontender.  Musculoskeletal: No lower extremity tenderness noted she does have 2+ left lower extremity edema, sure versus chronic after a leg injury occurred years ago and unchanged. Neurologic:  Normal speech and language. No gross focal neurologic deficits are appreciated.  Skin:  Skin is warm, dry and intact. No rash noted. Psychiatric: Mood and affect are normal. Speech and behavior are normal.  ____________________________________________   LABS (all labs ordered are listed, but only abnormal results are displayed)  Labs Reviewed  BASIC METABOLIC PANEL -  Abnormal; Notable for the following:       Result Value   Potassium 3.0 (*)    Glucose, Bld 150 (*)    Calcium 8.6 (*)    All other components within normal limits  CBC  TROPONIN I   ____________________________________________  EKG  Reviewed and interpreted by me at 1920 Heart rate 110 QRS 80 QTc 420 Sinus tachycardia, minimal nonspecific T-wave abnormality. No evidence of acute ischemia noted ____________________________________________  RADIOLOGY  Dg Chest Port 1 View  Result Date: 06/16/2016 CLINICAL DATA:  Bronchitis, productive cough, low oxygen saturation, history hypertension, asthma, GERD, COPD, diabetes mellitus EXAM: PORTABLE CHEST 1 VIEW COMPARISON:  Portable exam 1921 hours compared 09/20/2015 FINDINGS: Upper normal heart size. Mediastinal contours and pulmonary vascularity normal. Minimal bibasilar atelectasis and central peribronchial thickening. No definite infiltrate, pleural effusion or pneumothorax. Bones demineralized with scattered endplate spur formation thoracic spine. BILATERAL AC joint degenerative changes. IMPRESSION: Minimal bronchitic changes and bibasilar atelectasis. Electronically Signed   By: Lavonia Dana M.D.   On: 06/16/2016 19:37    ____________________________________________   PROCEDURES  Procedure(s) performed: None  Procedures  Critical Care performed: No  ____________________________________________   INITIAL IMPRESSION / ASSESSMENT AND PLAN / ED COURSE  Pertinent labs & imaging results that were available during my care of the patient were reviewed by me and considered in my medical decision making (see chart for details).  Patient presents for evaluation of increasing cough shortness of breath and wheezing. Was found initially to be in significant distress, but is making good progress after nebulizers.  No nausea or vomiting. No fevers or chills. Reports ongoing cough, currently started on cephalosporin 2 days ago.  Appears  most consistent with COPD exacerbation. End-tidal waveform demonstrates restrictive lung disease. Denies any chest pain.  ----------------------------------------- 8:50 PM on 06/16/2016 -----------------------------------------  Patient began to wheeze and show evidence of increased her breathing once again. She is able to speak in short phrases, mild use of accessory muscles, and ongoing and expiratory wheezing and tachypnea. We'll give additional albuterol, discussed with the patient and she is agreeable with plan for admission and ongoing care for COPD exacerbation      ____________________________________________   FINAL CLINICAL IMPRESSION(S) / ED DIAGNOSES  Final diagnoses:  COPD exacerbation ([place])      NEW MEDICATIONS STARTED DURING THIS VISIT:  New Prescriptions   No medications on file     Note:  This document was prepared using Dragon voice recognition software  and may include unintentional dictation errors.     Delman Kitten, MD 06/16/16 2050

## 2016-06-16 NOTE — ED Triage Notes (Signed)
Reports recent diagnosis of bronchitis with productive cough.  EMS reports original O2 sat on room air in upper 80's with end tidal in the 20's.  Patient received 2 combivent and 1 epi nebulizer.  Attempted IV without success.

## 2016-06-16 NOTE — H&P (Signed)
History and Physical   [facility] - [place] @ [facility] Admission History and Physical [employer], D.O.    Patient Name: Jenna [name] MR#: [id number] Date of Birth: February 19, 1935 Date of Admission: 06/16/2016  Referring MD/NP/PA: Dr. Jacqualine Code Primary Care Physician: Sofie Hartigan, MD Patient coming from: Home Outpatient Specialists: Dr. Raul Del   Chief Complaint:  Chief Complaint  Patient presents with  . Shortness of Breath    HPI: Jenna [name] is a 81 y.o. female with a known history of Asthma, COPD, diabetes, depression, GERD, hypertension, hyperlipidemia, obstructive sleep apnea, status post gastric bypass surgery presents to the emergency department for evaluation of shortness of breath.  Patient was in a usual state of health until last week when she developed cough which is nonproductive associated with soreness of breath and wheezing which has been refractory to Cefdinir and inhalers at home. Per EMS O2 sat was in the 80s therefore In the ambulance she received 2 DuoNeb's, racemic epinephrine. In the emergency department she received DuoNeb, albuterol 2 and 125 mg of Solu-Medrol.  Of note patient and her husband drove home from [place] and her symptoms began that same evening. She states that she did get out of the car to walk around. She denies lower extremity swelling or pain.  Patient denies fevers/chills, weakness, dizziness, chest pain, shortness of breath, N/V/C/D, abdominal pain, dysuria/frequency, changes in mental status.    Otherwise there has been no change in status. Patient has been taking medication as prescribed and there has been no recent change in medication or diet.  No recent antibiotics.  There has been no recent illness, hospitalizations, sick contacts.    Review of Systems:  CONSTITUTIONAL: No fever/chills, fatigue, weakness, weight gain/loss, headache. EYES: No blurry or double vision. ENT: No tinnitus, postnasal drip, redness or soreness of the  oropharynx. RESPIRATORY: Positive cough, dyspnea, wheeze.  No hemoptysis.  CARDIOVASCULAR: No chest pain, palpitations, syncope, orthopnea. No lower extremity edema.  GASTROINTESTINAL: No nausea, vomiting, abdominal pain, diarrhea, constipation.  No hematemesis, melena or hematochezia. GENITOURINARY: No dysuria, frequency, hematuria. ENDOCRINE: No polyuria or nocturia. No heat or cold intolerance. HEMATOLOGY: No anemia, bruising, bleeding. INTEGUMENTARY: No rashes, ulcers, lesions. MUSCULOSKELETAL: No arthritis, gout, dyspnea. NEUROLOGIC: No numbness, tingling, ataxia, seizure-type activity, weakness. PSYCHIATRIC: No anxiety, depression, insomnia.   Past Medical History:  Diagnosis Date  . Arthritis    neck, knees(before replacements)  . Asthma   . COPD (chronic obstructive pulmonary disease) ([place])   . Depression   . Diabetes mellitus without complication ([place])    resolved with gastric bypass  . Encounter for colonoscopy due to history of adenomatous colonic polyps   . GERD (gastroesophageal reflux disease)   . Hearing loss of both ears   . Hyperlipidemia   . Hypertension   . Palpitations   . Sleep apnea    resolved with gastric bypass  . Syncope and collapse     Past Surgical History:  Procedure Laterality Date  . BROW LIFT Bilateral 06/27/2015   Procedure: BLEPHAROPLASTY BILATERAL UPPER EYELIDS BILATERAL BLEPHAROTOSIS EYELIDS;  Surgeon: Karle Starch, MD;  Location: [place];  Service: Ophthalmology;  Laterality: Bilateral;  BILATERAL  . CATARACT EXTRACTION W/PHACO Right 02/13/2015   Procedure: CATARACT EXTRACTION PHACO AND INTRAOCULAR LENS PLACEMENT (IOC);  Surgeon: Ronnell Freshwater, MD;  Location: [place];  Service: Ophthalmology;  Laterality: Right;  . CATARACT EXTRACTION W/PHACO Left 03/22/2015   Procedure: CATARACT EXTRACTION PHACO AND INTRAOCULAR LENS PLACEMENT (IOC);  Surgeon: Deirdre Peer  Vin-Parikh, MD;  Location: [place];   Service: Ophthalmology;  Laterality: Left;  TORIC  . CHOLECYSTECTOMY  1984  . COLECTOMY    . COSMETIC SURGERY  2012   tummy tuck and excess skin removal  . GASTRIC BYPASS  2010  . REPLACEMENT TOTAL KNEE BILATERAL  1998  . SHOULDER SURGERY  2009   left   . TONSILLECTOMY    . TOTAL ABDOMINAL HYSTERECTOMY  1979     reports that she has quit smoking. Her smoking use included Cigarettes. She has a 3.75 pack-year smoking history. She has never used smokeless tobacco. She reports that she does not drink alcohol or use drugs.  Allergies  Allergen Reactions  . Codeine Other (See Comments)    "will not stay down"  . Hydromorphone Other (See Comments)    confusion, personality change  . Morphine Other (See Comments)    Goes crazy  . Nsaids Other (See Comments)    Avoids because of gastric bypass surgery  . Oxycodone Nausea And Vomiting  . Oxycodone-Acetaminophen Nausea And Vomiting  . Nabumetone Rash  . Promethazine Hcl Rash and Other (See Comments)    Family History  Problem Relation Age of Onset  . Heart attack Mother   . Hypertension Mother   . Heart attack Brother   . Heart attack Sister 105  . Hyperlipidemia Sister   . Hypertension Sister     Prior to Admission medications   Medication Sig Start Date End Date Taking? Authorizing Provider  albuterol (PROVENTIL HFA;VENTOLIN HFA) 108 (90 BASE) MCG/ACT inhaler Inhale 2 puffs into the lungs every 6 (six) hours as needed for wheezing or shortness of breath. 06/14/14   Demetrios Loll, MD  Ascorbic Acid (VITAMIN C) 1000 MG tablet Take 500 mg by mouth daily.     [provider]  Cholecalciferol (VITAMIN D-1000 MAX ST) 1000 UNITS tablet Take 1,000 Units by mouth daily.     [provider]  ciprofloxacin (CIPRO) 500 MG tablet Take 1 tablet (500 mg total) by mouth 2 (two) times daily. 09/23/15   Demetrios Loll, MD  citalopram (CELEXA) 40 MG tablet Take 40 mg by mouth daily.     [provider]  Cyanocobalamin (RA VITAMIN  B-12 TR) 1000 MCG TBCR Take 1,000 mcg by mouth daily.     [provider]  docusate sodium (COLACE) 100 MG capsule Take 100 mg by mouth at bedtime.     [provider]  erythromycin ophthalmic ointment 1 application at bedtime. Use a small amount on sutures 4 times a day for 10-14 days    [provider]  ferrous sulfate 325 (65 FE) MG EC tablet Take 325 mg by mouth at bedtime.     [provider]  furosemide (LASIX) 20 MG tablet Take 20 mg by mouth daily.  09/28/13   [provider]  Multiple Vitamins-Minerals (ONE-A-DAY WOMENS PETITES) TABS Take 2 tablets by mouth every morning.    [provider]  Omega-3 Fatty Acids (FISH OIL) 1000 MG CAPS Take 1,000 mg by mouth daily.    [provider]  ondansetron (ZOFRAN-ODT) 4 MG disintegrating tablet Take 4 mg by mouth every 8 (eight) hours as needed for nausea or vomiting.    [provider]  ranitidine (ZANTAC) 150 MG tablet Take 1 tablet (150 mg total) by mouth at bedtime. Patient taking differently: Take 150 mg by mouth 2 (two) times daily.  09/21/15 09/20/16  Daymon Larsen, MD  simvastatin (ZOCOR) 40  MG tablet Take 40 mg by mouth daily.     [provider]    Physical Exam: Vitals:   06/16/16 1911 06/16/16 1915  BP:  (!) 152/81  Pulse:  (!) 109  Resp:  (!) 25  Temp:  98.1 F (36.7 C)  TempSrc:  Axillary  SpO2:  95%  Weight: 97.3 kg (214 lb 8.1 oz)   Height: 5\' 2"  (1.575 m)     GENERAL: 81 y.o.-year-old Female patient, well-developed, well-nourished lying in the bed in no acute distress.  Pleasant and cooperative.   HEENT: Head atraumatic, normocephalic. Pupils equal, round, reactive to light and accommodation. No scleral icterus. Extraocular muscles intact. Nares are patent. Oropharynx is clear. Mucus membranes moist. NECK: Supple, full range of motion. No JVD, no bruit heard. No thyroid enlargement, no tenderness, no cervical lymphadenopathy. CHEST: Diffuse  expiratory wheezesenderness.  CARDIOVASCULAR: S1, S2 normal. No murmurs, rubs, or gallops. Cap refill <2 seconds. Pulses intact distally.  ABDOMEN: Soft, nondistended, nontender. No rebound, guarding, rigidity. Normoactive bowel sounds present in all four quadrants. No organomegaly or mass. EXTREMITIESMild nonpitting bilateral lower extremity edema. No cyanosis, or clubbing. No calf tenderness or Homan's sign.  NEUROLOGIC: The patient is alert and oriented x 3. Cranial nerves II through XII are grossly intact with no focal sensorimotor deficit. Muscle strength 5/5 in all extremities. Sensation intact. Gait not checked. PSYCHIATRIC:  Normal affect, mood, thought content. SKIN: Warm, dry, and intact without obvious rash, lesion, or ulcer.    Labs on Admission:  CBC:  Recent Labs Lab 06/16/16 1921  WBC 7.0  HGB 12.7  HCT 36.7  MCV 96.1  PLT 720   Basic Metabolic Panel:  Recent Labs Lab 06/16/16 1921  NA 141  K 3.0*  CL 106  CO2 26  GLUCOSE 150*  BUN 14  CREATININE 0.73  CALCIUM 8.6*   GFR: Estimated Creatinine Clearance: 62.1 mL/min (by C-G formula based on SCr of 0.73 mg/dL). Liver Function Tests: No results for input(s): AST, ALT, ALKPHOS, BILITOT, PROT, ALBUMIN in the last 168 hours. No results for input(s): LIPASE, AMYLASE in the last 168 hours. No results for input(s): AMMONIA in the last 168 hours. Coagulation Profile: No results for input(s): INR, PROTIME in the last 168 hours. Cardiac Enzymes:  Recent Labs Lab 06/16/16 1921  TROPONINI <0.03   BNP (last 3 results) No results for input(s): PROBNP in the last 8760 hours. HbA1C: No results for input(s): HGBA1C in the last 72 hours. CBG: No results for input(s): GLUCAP in the last 168 hours. Lipid Profile: No results for input(s): CHOL, HDL, LDLCALC, TRIG, CHOLHDL, LDLDIRECT in the last 72 hours. Thyroid Function Tests: No results for input(s): TSH, T4TOTAL, FREET4, T3FREE, THYROIDAB in the last 72  hours. Anemia Panel: No results for input(s): VITAMINB12, FOLATE, FERRITIN, TIBC, IRON, RETICCTPCT in the last 72 hours. Urine analysis:    Component Value Date/Time   COLORURINE AMBER (A) 09/22/2015 1214   APPEARANCEUR CLEAR (A) 09/22/2015 1214   LABSPEC 1.025 09/22/2015 1214   PHURINE 5.0 09/22/2015 1214   GLUCOSEU NEGATIVE 09/22/2015 1214   HGBUR NEGATIVE 09/22/2015 1214   BILIRUBINUR 2+ (A) 09/22/2015 1214   KETONESUR TRACE (A) 09/22/2015 1214   PROTEINUR 30 (A) 09/22/2015 1214   NITRITE NEGATIVE 09/22/2015 1214   LEUKOCYTESUR 1+ (A) 09/22/2015 1214   Sepsis Labs: @LABRCNTIP (procalcitonin:4,lacticidven:4) )No results found for this or any previous visit (from the past 240 hour(s)).   Radiological Exams on Admission: Dg Chest [facility] 1 View  Result  Date: 06/16/2016 CLINICAL DATA:  Bronchitis, productive cough, low oxygen saturation, history hypertension, asthma, GERD, COPD, diabetes mellitus EXAM: PORTABLE CHEST 1 VIEW COMPARISON:  Portable exam 1921 hours compared 09/20/2015 FINDINGS: Upper normal heart size. Mediastinal contours and pulmonary vascularity normal. Minimal bibasilar atelectasis and central peribronchial thickening. No definite infiltrate, pleural effusion or pneumothorax. Bones demineralized with scattered endplate spur formation thoracic spine. BILATERAL AC joint degenerative changes. IMPRESSION: Minimal bronchitic changes and bibasilar atelectasis. Electronically Signed   By: Lavonia Dana M.D.   On: 06/16/2016 19:37    EKG: Sinus tachycardia at 110 bpm bpm with normal axis and nonspecific ST-T wave changes.   Assessment/Plan  This is a 81 y.o. female with a history of Asthma, COPD, diabetes, depression, GERD, hypertension, hyperlipidemia, obstructive sleep apnea, status post gastric bypass surgery now being admitted with:  #. Acute exacerbation of COPD - Admit observation - IV steroids and continue Cefdinir - Add on d-dimer and check CTA of elevated given  moderate risk of pulmonary embolism - Nebulizers, O2 therapy and expectorants as needed.  - Continuous pulse oximetry - Consider pulmonary consult if not improving.   #. Hypokalemia, mild -Replace by mouth and recheck BMP in a.m.  #. History of GERD - Continue Pepcid for ranitidine   #. History of depression  Continue Celexa   #. History of Hypertension  - ContinLasix  #. History of Hyperlipidemia  - Continue Zocor  Admission status: Observation IV Fluids: HL Diet/Nutrition: HH Consults called: None  DVT Px: Lovenox, SCDs and early ambulation. Code Status: Full Code  Disposition Plan: To home in 1-2 days  All the records are reviewed and case discussed with ED provider. Management plans discussed with the patient and/or family who express understanding and agree with plan of care.  Isack Lavalley D.O. on 06/16/2016 at 9:06 PM Between 7am to 6pm - Pager - [phone] After 6pm go to [email or web address] - [employer] [facility] [place] [facility] Office [phone] CC: Primary care physician; Sofie Hartigan, MD   06/16/2016, 9:06 PM

## 2016-06-17 ENCOUNTER — Observation Stay: Payer: Medicare Other

## 2016-06-17 ENCOUNTER — Encounter: Payer: Self-pay | Admitting: Radiology

## 2016-06-17 DIAGNOSIS — I10 Essential (primary) hypertension: Secondary | ICD-10-CM | POA: Diagnosis present

## 2016-06-17 DIAGNOSIS — Z90710 Acquired absence of both cervix and uterus: Secondary | ICD-10-CM | POA: Diagnosis not present

## 2016-06-17 DIAGNOSIS — Z8249 Family history of ischemic heart disease and other diseases of the circulatory system: Secondary | ICD-10-CM | POA: Diagnosis not present

## 2016-06-17 DIAGNOSIS — Z9884 Bariatric surgery status: Secondary | ICD-10-CM | POA: Diagnosis not present

## 2016-06-17 DIAGNOSIS — E876 Hypokalemia: Secondary | ICD-10-CM | POA: Diagnosis present

## 2016-06-17 DIAGNOSIS — F329 Major depressive disorder, single episode, unspecified: Secondary | ICD-10-CM | POA: Diagnosis present

## 2016-06-17 DIAGNOSIS — H9193 Unspecified hearing loss, bilateral: Secondary | ICD-10-CM | POA: Diagnosis present

## 2016-06-17 DIAGNOSIS — K219 Gastro-esophageal reflux disease without esophagitis: Secondary | ICD-10-CM | POA: Diagnosis present

## 2016-06-17 DIAGNOSIS — Z9049 Acquired absence of other specified parts of digestive tract: Secondary | ICD-10-CM | POA: Diagnosis not present

## 2016-06-17 DIAGNOSIS — E785 Hyperlipidemia, unspecified: Secondary | ICD-10-CM | POA: Diagnosis present

## 2016-06-17 DIAGNOSIS — J440 Chronic obstructive pulmonary disease with acute lower respiratory infection: Secondary | ICD-10-CM | POA: Diagnosis present

## 2016-06-17 DIAGNOSIS — J45901 Unspecified asthma with (acute) exacerbation: Secondary | ICD-10-CM | POA: Diagnosis present

## 2016-06-17 DIAGNOSIS — J9601 Acute respiratory failure with hypoxia: Secondary | ICD-10-CM | POA: Diagnosis present

## 2016-06-17 DIAGNOSIS — Z885 Allergy status to narcotic agent status: Secondary | ICD-10-CM | POA: Diagnosis not present

## 2016-06-17 DIAGNOSIS — Z888 Allergy status to other drugs, medicaments and biological substances status: Secondary | ICD-10-CM | POA: Diagnosis not present

## 2016-06-17 DIAGNOSIS — Z96653 Presence of artificial knee joint, bilateral: Secondary | ICD-10-CM | POA: Diagnosis present

## 2016-06-17 DIAGNOSIS — T380X5A Adverse effect of glucocorticoids and synthetic analogues, initial encounter: Secondary | ICD-10-CM | POA: Diagnosis present

## 2016-06-17 DIAGNOSIS — Z87891 Personal history of nicotine dependence: Secondary | ICD-10-CM | POA: Diagnosis not present

## 2016-06-17 DIAGNOSIS — E1165 Type 2 diabetes mellitus with hyperglycemia: Secondary | ICD-10-CM | POA: Diagnosis present

## 2016-06-17 DIAGNOSIS — M47812 Spondylosis without myelopathy or radiculopathy, cervical region: Secondary | ICD-10-CM | POA: Diagnosis present

## 2016-06-17 DIAGNOSIS — J441 Chronic obstructive pulmonary disease with (acute) exacerbation: Secondary | ICD-10-CM | POA: Diagnosis present

## 2016-06-17 DIAGNOSIS — J209 Acute bronchitis, unspecified: Secondary | ICD-10-CM | POA: Diagnosis present

## 2016-06-17 DIAGNOSIS — G4733 Obstructive sleep apnea (adult) (pediatric): Secondary | ICD-10-CM | POA: Diagnosis present

## 2016-06-17 DIAGNOSIS — E669 Obesity, unspecified: Secondary | ICD-10-CM | POA: Diagnosis present

## 2016-06-17 LAB — GLUCOSE, CAPILLARY
Glucose-Capillary: 287 mg/dL — ABNORMAL HIGH (ref 65–99)
Glucose-Capillary: 321 mg/dL — ABNORMAL HIGH (ref 65–99)
Glucose-Capillary: 117 mg/dL — ABNORMAL HIGH (ref 65–99)

## 2016-06-17 LAB — CBC
HCT: 35.5 % (ref 35.0–47.0)
Hemoglobin: 12.4 g/dL (ref 12.0–16.0)
MCH: 33.8 pg (ref 26.0–34.0)
MCHC: 34.9 g/dL (ref 32.0–36.0)
MCV: 96.9 fL (ref 80.0–100.0)
Platelets: 180 10*3/uL (ref 150–440)
RBC: 3.67 MIL/uL — ABNORMAL LOW (ref 3.80–5.20)
RDW: 13 % (ref 11.5–14.5)
WBC: 7.3 10*3/uL (ref 3.6–11.0)

## 2016-06-17 LAB — EXPECTORATED SPUTUM ASSESSMENT W GRAM STAIN, RFLX TO RESP C

## 2016-06-17 LAB — BASIC METABOLIC PANEL
Anion gap: 13 (ref 5–15)
BUN: 15 mg/dL (ref 6–20)
CO2: 23 mmol/L (ref 22–32)
Calcium: 8.5 mg/dL — ABNORMAL LOW (ref 8.9–10.3)
Chloride: 104 mmol/L (ref 101–111)
Creatinine, Ser: 0.95 mg/dL (ref 0.44–1.00)
GFR calc Af Amer: >60 mL/min (ref >60)
GFR calc non Af Amer: 55 mL/min — ABNORMAL LOW (ref >60)
Glucose, Bld: 354 mg/dL — ABNORMAL HIGH (ref 65–99)
Potassium: 3.7 mmol/L (ref 3.5–5.1)
Sodium: 140 mmol/L (ref 135–145)

## 2016-06-17 MED ORDER — IPRATROPIUM-ALBUTEROL 0.5-2.5 (3) MG/3ML IN SOLN
3.0000 mL | RESPIRATORY_TRACT | Status: DC
  Administered 2016-06-17 – 2016-06-23 (×34): 3 mL via RESPIRATORY_TRACT
  Filled 2016-06-17 (×33): qty 3

## 2016-06-17 MED ORDER — TRAZODONE HCL 50 MG PO TABS
50.0000 mg | ORAL_TABLET | Freq: Every evening | ORAL | Status: DC | PRN
  Administered 2016-06-17 – 2016-06-21 (×5): 50 mg via ORAL
  Filled 2016-06-17 (×5): qty 1

## 2016-06-17 MED ORDER — INSULIN ASPART 100 UNIT/ML ~~LOC~~ SOLN
0.0000 [IU] | Freq: Three times a day (TID) | SUBCUTANEOUS | Status: DC
  Administered 2016-06-17: 11 [IU] via SUBCUTANEOUS
  Administered 2016-06-17 – 2016-06-18 (×2): 8 [IU] via SUBCUTANEOUS
  Administered 2016-06-18: 2 [IU] via SUBCUTANEOUS
  Administered 2016-06-18 – 2016-06-19 (×2): 5 [IU] via SUBCUTANEOUS
  Administered 2016-06-19 (×2): 3 [IU] via SUBCUTANEOUS
  Administered 2016-06-20: 2 [IU] via SUBCUTANEOUS
  Administered 2016-06-20: 3 [IU] via SUBCUTANEOUS
  Administered 2016-06-20: 5 [IU] via SUBCUTANEOUS
  Administered 2016-06-21: 3 [IU] via SUBCUTANEOUS
  Administered 2016-06-21 – 2016-06-22 (×2): 5 [IU] via SUBCUTANEOUS
  Administered 2016-06-22 – 2016-06-23 (×2): 3 [IU] via SUBCUTANEOUS
  Filled 2016-06-17: qty 2
  Filled 2016-06-17: qty 5
  Filled 2016-06-17 (×2): qty 3
  Filled 2016-06-17: qty 5
  Filled 2016-06-17 (×2): qty 3
  Filled 2016-06-17: qty 8
  Filled 2016-06-17: qty 5
  Filled 2016-06-17: qty 8
  Filled 2016-06-17: qty 3
  Filled 2016-06-17: qty 2
  Filled 2016-06-17: qty 11
  Filled 2016-06-17 (×2): qty 5
  Filled 2016-06-17: qty 3

## 2016-06-17 MED ORDER — IOPAMIDOL (ISOVUE-370) INJECTION 76%
75.0000 mL | Freq: Once | INTRAVENOUS | Status: AC | PRN
  Administered 2016-06-17: 75 mL via INTRAVENOUS

## 2016-06-17 MED ORDER — HYDROCOD POLST-CPM POLST ER 10-8 MG/5ML PO SUER
5.0000 mL | Freq: Two times a day (BID) | ORAL | Status: DC
  Administered 2016-06-17 – 2016-06-23 (×13): 5 mL via ORAL
  Filled 2016-06-17 (×15): qty 5

## 2016-06-17 MED ORDER — INSULIN ASPART 100 UNIT/ML ~~LOC~~ SOLN
0.0000 [IU] | Freq: Every day | SUBCUTANEOUS | Status: DC
  Administered 2016-06-18 – 2016-06-22 (×2): 2 [IU] via SUBCUTANEOUS
  Filled 2016-06-17 (×2): qty 2

## 2016-06-17 NOTE — Care Management Obs Status (Signed)
[place] NOTIFICATION   Patient Details  Name: Jenna [name] MRN: [id number] Date of Birth: [birth year]   Medicare Observation Status Notification Given:  Yes    Katrina Stack, RN 06/17/2016, 10:36 AM

## 2016-06-17 NOTE — Evaluation (Signed)
Physical Therapy Evaluation Patient Details Name: Jenna [name] MRN: [id number] DOB: May 05, 1935 Today's Date: 06/17/2016   History of Present Illness  Pt admitted for COPD exacerbation. Pt with complaints of SOB symptoms. Pt with history of asthma, COPD, DM and depression. Pt reports recent falls  Clinical Impression  Pt is a pleasant 81 year old female who was admitted for COPD exacerbation. Pt performs bed mobility/transfers with supervision and ambulation with cga with no AD. Pt demonstrates deficits with endurance and begins wheezing with minimal exertion. Educated on there-ex to perform in bed to prevent deconditioning. Would benefit from skilled PT to address above deficits and promote optimal return to PLOF. Recommend OP PT for further endurance training.      Follow Up Recommendations Outpatient PT    Equipment Recommendations  None recommended by PT    Recommendations for Other Services       Precautions / Restrictions Precautions Precautions: Fall Restrictions Weight Bearing Restrictions: No      Mobility  Bed Mobility Overal bed mobility: Needs Assistance Bed Mobility: Supine to Sit     Supine to sit: Supervision     General bed mobility comments: safe technique performed with upright posture.  Transfers Overall transfer level: Needs assistance Equipment used: None Transfers: Sit to/from Stand Sit to Stand: Supervision         General transfer comment: safe technique performed with upright posture. No AD  Ambulation/Gait Ambulation/Gait assistance: Min guard Ambulation Distance (Feet): 10 Feet Assistive device: None Gait Pattern/deviations: WFL(Within Functional Limits)     General Gait Details: safe technique performed without AD, however fatigues quickly and reqests to return back to bed. O2 donned for all mobility with sats at 95%. Increased wheezing noted with minimal exertion.  Stairs            Wheelchair Mobility    Modified  Rankin (Stroke Patients Only)       Balance Overall balance assessment: Needs assistance Sitting-balance support: Feet supported Sitting balance-Leahy Scale: Normal     Standing balance support: No upper extremity supported Standing balance-Leahy Scale: Good                               Pertinent Vitals/Pain Pain Assessment: No/denies pain    Home Living Family/patient expects to be discharged to:: Private residence Living Arrangements: Spouse/significant other;Children (daughter just moved in with patient) Available Help at Discharge: Family Type of Home: House Home Access: Ramped entrance     Home Layout: One level (1 step to enter kitchen with grab bars) Home Equipment: Walker - 2 wheels;Cane - single point;Crutches      Prior Function Level of Independence: Independent         Comments: wasn't using an AD, however household ambulator with recent falls     Hand Dominance        Extremity/Trunk Assessment   Upper Extremity Assessment Upper Extremity Assessment: Overall WFL for tasks assessed    Lower Extremity Assessment Lower Extremity Assessment: Overall WFL for tasks assessed       Communication   Communication: No difficulties  Cognition Arousal/Alertness: Awake/alert Behavior During Therapy: WFL for tasks assessed/performed Overall Cognitive Status: Within Functional Limits for tasks assessed                                        General Comments  Exercises     Assessment/Plan    PT Assessment Patient needs continued PT services  PT Problem List Decreased activity tolerance;Decreased balance;Decreased mobility       PT Treatment Interventions Gait training;Therapeutic activities    PT Goals (Current goals can be found in the Care Plan section)  Acute Rehab PT Goals Patient Stated Goal: to go home PT Goal Formulation: With patient Time For Goal Achievement: 07/01/16 Potential to Achieve Goals:  Good    Frequency Min 2X/week   Barriers to discharge        Co-evaluation               AM-PAC PT "6 Clicks" Daily Activity  Outcome Measure Difficulty turning over in bed (including adjusting bedclothes, sheets and blankets)?: None Difficulty moving from lying on back to sitting on the side of the bed? : None Difficulty sitting down on and standing up from a chair with arms (e.g., wheelchair, bedside commode, etc,.)?: None Help needed moving to and from a bed to chair (including a wheelchair)?: A Little Help needed walking in hospital room?: A Little Help needed climbing 3-5 steps with a railing? : A Little 6 Click Score: 21    End of Session Equipment Utilized During Treatment: Gait belt;Oxygen Activity Tolerance: Treatment limited secondary to medical complications (Comment) Patient left: in bed;with bed alarm set Nurse Communication: Mobility status PT Visit Diagnosis: Repeated falls (R29.6);Unsteadiness on feet (R26.81)    Time: 3154-0086 PT Time Calculation (min) (ACUTE ONLY): 13 min   Charges:   PT Evaluation $PT Eval Low Complexity: 1 Procedure     PT G Codes:   PT G-Codes **NOT FOR INPATIENT CLASS** Functional Assessment Tool Used: AM-PAC 6 Clicks Basic Mobility Functional Limitation: Mobility: Walking and moving around Mobility: Walking and Moving Around Current Status (P6195): At least 20 percent but less than 40 percent impaired, limited or restricted Mobility: Walking and Moving Around Goal Status [phone]): At least 1 percent but less than 20 percent impaired, limited or restricted    Greggory Stallion, PT, DPT [phone]   Kirsta Probert 06/17/2016, 9:53 AM

## 2016-06-17 NOTE — Progress Notes (Signed)
[facility] [place] NAME: Jenna [name]    MR#:  [id number]  DATE OF BIRTH:  May 03, 1935  SUBJECTIVE:  CHIEF COMPLAINT:   Chief Complaint  Patient presents with  . Shortness of Breath    REVIEW OF SYSTEMS:  Review of Systems  Constitutional: Positive for malaise/fatigue. Negative for chills and fever.  HENT: Negative for congestion, ear discharge, hearing loss, nosebleeds and sore throat.   Eyes: Negative for blurred vision and double vision.  Respiratory: Positive for cough, shortness of breath and wheezing.   Cardiovascular: Negative for chest pain, palpitations and leg swelling.  Gastrointestinal: Negative for abdominal pain, constipation, diarrhea, nausea and vomiting.  Genitourinary: Negative for dysuria.  Musculoskeletal: Negative for myalgias.  Neurological: Negative for dizziness, sensory change, speech change, focal weakness, seizures and headaches.  Psychiatric/Behavioral: Negative for depression.    DRUG ALLERGIES:   Allergies  Allergen Reactions  . Azithromycin Other (See Comments)    Causes stomach burning pains  . Codeine Other (See Comments)    "will not stay down"  . Hydromorphone Other (See Comments)    confusion, personality change  . Morphine Other (See Comments)    Goes crazy  . Nsaids Other (See Comments)    Avoids because of gastric bypass surgery  . Oxycodone Nausea And Vomiting  . Oxycodone-Acetaminophen Nausea And Vomiting  . Nabumetone Rash  . Promethazine Hcl Rash and Other (See Comments)    VITALS:  Blood pressure (!) 120/51, pulse 100, temperature 97.7 F (36.5 C), temperature source Oral, resp. rate 16, height 5\' 2"  (1.575 m), weight 98.6 kg (217 lb 4.8 oz), SpO2 96 %.  PHYSICAL EXAMINATION:  Physical Exam  GENERAL:  81 y.o.-year-old patient sitting in the bed, appears dyspneic.  EYES: Pupils equal, round, reactive to light and accommodation. No scleral icterus. Extraocular muscles intact.    HEENT: Head atraumatic, normocephalic. Oropharynx and nasopharynx clear.  NECK:  Supple, no jugular venous distention. No thyroid enlargement, no tenderness.  LUNGS: diffuse scattered wheezing all over the lung fields, no rales,rhonchi or crepitation. Using accessory muscles of respiration with minimal exertion.  CARDIOVASCULAR: S1, S2 normal. No rubs, or gallops. 2/6 systolic murmur present ABDOMEN: Soft, nontender, nondistended. Bowel sounds present. No organomegaly or mass.  EXTREMITIES: No pedal edema, cyanosis, or clubbing.  NEUROLOGIC: Cranial nerves II through XII are intact. Muscle strength 5/5 in all extremities. Sensation intact. Gait not checked.  PSYCHIATRIC: The patient is alert and oriented x 3.  SKIN: No obvious rash, lesion, or ulcer.    LABORATORY PANEL:   CBC  Recent Labs Lab 06/17/16 0326  WBC 7.3  HGB 12.4  HCT 35.5  PLT 180   ------------------------------------------------------------------------------------------------------------------  Chemistries   Recent Labs Lab 06/16/16 1921 06/17/16 0326  NA 141 140  K 3.0* 3.7  CL 106 104  CO2 26 23  GLUCOSE 150* 354*  BUN 14 15  CREATININE 0.73 0.95  CALCIUM 8.6* 8.5*  MG 1.4*  --    ------------------------------------------------------------------------------------------------------------------  Cardiac Enzymes  Recent Labs Lab 06/16/16 1921  TROPONINI <0.03   ------------------------------------------------------------------------------------------------------------------  RADIOLOGY:  Ct Angio Chest Pe W Or Wo Contrast  Result Date: 06/17/2016 CLINICAL DATA:  81 year old female with shortness of breath and wheezing. History of COPD and asthma. Recent travel. EXAM: CT ANGIOGRAPHY CHEST WITH CONTRAST TECHNIQUE: Multidetector CT imaging of the chest was performed using the standard protocol during bolus administration of intravenous contrast. Multiplanar CT image reconstructions and MIPs were  obtained to  evaluate the vascular anatomy. CONTRAST:  75 cc Isovue 370 COMPARISON:  Chest radiograph dated 06/16/2016 FINDINGS: Cardiovascular: Top-normal cardiac size. There is a small pericardial effusion. There is mild atherosclerotic calcification of the thoracic aorta. No aneurysmal dilatation or evidence of dissection. The origins of the great vessels of the aortic arch appear patent. Evaluation of the pulmonary arteries is limited due to respiratory motion artifact and suboptimal opacification of the distal branches. No large or central pulmonary artery embolus identified. Mediastinum/Nodes: There is no hilar or mediastinal adenopathy. There is air within the esophagus. Esophagus is otherwise unremarkable. No discrete thyroid nodules identified. Lungs/Pleura: Lungs are clear. No pleural effusion or pneumothorax. Upper Abdomen: There is postsurgical changes of gastric bypass. The visualized upper abdomen is otherwise unremarkable. Musculoskeletal: Degenerative changes of the spine. No acute osseous pathology. Review of the MIP images confirms the above findings. IMPRESSION: No acute intrathoracic pathology. No CT evidence of pulmonary embolus. Electronically Signed   By: Anner Crete M.D.   On: 06/17/2016 00:40   Dg Chest Port 1 View  Result Date: 06/16/2016 CLINICAL DATA:  Bronchitis, productive cough, low oxygen saturation, history hypertension, asthma, GERD, COPD, diabetes mellitus EXAM: PORTABLE CHEST 1 VIEW COMPARISON:  Portable exam 1921 hours compared 09/20/2015 FINDINGS: Upper normal heart size. Mediastinal contours and pulmonary vascularity normal. Minimal bibasilar atelectasis and central peribronchial thickening. No definite infiltrate, pleural effusion or pneumothorax. Bones demineralized with scattered endplate spur formation thoracic spine. BILATERAL AC joint degenerative changes. IMPRESSION: Minimal bronchitic changes and bibasilar atelectasis. Electronically Signed   By: Lavonia Dana  M.D.   On: 06/16/2016 19:37    EKG:   Orders placed or performed during the hospital encounter of 06/16/16  . EKG test  . EKG test  . EKG 12-Lead  . EKG 12-Lead  . EKG 12-Lead    ASSESSMENT AND PLAN:   81 year old female with past medical history significant for COPD, asthma not on home oxygen, diabetes, GERD, hypertension and obstructive sleep apnea status post bypass surgery presents to emergency room secondary to acute on chronic COPD exacerbation.  #1 acute on chronic COPD exacerbation with acute bronchitis-still has significant wheezing. -Continue IV steroids. Requiring 2 L oxygen which is acute. -Recent travel. No sick contacts. -CT of the chest negative for pulmonary embolism. -Continue duo nebs, changed them to scheduled. Also on Ceftin. -Off medications added.  #2 hyperglycemia-does not take any medications for diabetes at home. Diet-controlled. -Check A1c. Sugars are elevated secondary to steroids likely. -For now and sliding scale insulin.  #3 depression-stable continue home medications.  #4 GERD-on Pepcid  #5 DVT prophylaxis-Lovenox-     All the records are reviewed and case discussed with Care Management/Social Workerr. Management plans discussed with the patient, family and they are in agreement.  CODE STATUS: Full Code  TOTAL TIME TAKING CARE OF THIS PATIENT: 38 minutes.   POSSIBLE D/C IN 1-2 DAYS, DEPENDING ON CLINICAL CONDITION.   Gladstone Lighter M.D on 06/17/2016 at 11:48 AM  Between 7am to 6pm - Pager - [phone]  After 6pm go to [email or web address] - password [email or web address] [facility]  Office  [phone]  CC: Primary care physician; Sofie Hartigan, MD

## 2016-06-17 NOTE — Care Management (Addendum)
Placed in observation for exacerbation of copd with initial room air sats "in the 80's."  Independent in all adls, denies issues accessing medical care, obtaining medications or with transportation.  Current with her PCP. Current need for oxygen is acute

## 2016-06-17 NOTE — Progress Notes (Addendum)
CH responded to an OR for AD. CH met with Pt and Pt's husband who was at her bedside. CH provided an AD and educated Pt on how to complete AD. Pt requested more time to review AD before completing it. Pt stated she would be discharged today.

## 2016-06-18 LAB — BASIC METABOLIC PANEL
Anion gap: 9 (ref 5–15)
BUN: 29 mg/dL — ABNORMAL HIGH (ref 6–20)
CO2: 24 mmol/L (ref 22–32)
Calcium: 8.9 mg/dL (ref 8.9–10.3)
Chloride: 105 mmol/L (ref 101–111)
Creatinine, Ser: 0.97 mg/dL (ref 0.44–1.00)
GFR calc Af Amer: >60 mL/min (ref >60)
GFR calc non Af Amer: 54 mL/min — ABNORMAL LOW (ref >60)
Glucose, Bld: 257 mg/dL — ABNORMAL HIGH (ref 65–99)
Potassium: 4 mmol/L (ref 3.5–5.1)
Sodium: 138 mmol/L (ref 135–145)

## 2016-06-18 LAB — GLUCOSE, CAPILLARY
Glucose-Capillary: 243 mg/dL — ABNORMAL HIGH (ref 65–99)
Glucose-Capillary: 276 mg/dL — ABNORMAL HIGH (ref 65–99)
Glucose-Capillary: 139 mg/dL — ABNORMAL HIGH (ref 65–99)
Glucose-Capillary: 215 mg/dL — ABNORMAL HIGH (ref 65–99)

## 2016-06-18 LAB — HEMOGLOBIN A1C
Hgb A1c MFr Bld: 6.1 % — ABNORMAL HIGH (ref 4.8–5.6)
Mean Plasma Glucose: 128 mg/dL

## 2016-06-18 MED ORDER — METHYLPREDNISOLONE SODIUM SUCC 125 MG IJ SOLR
60.0000 mg | Freq: Two times a day (BID) | INTRAMUSCULAR | Status: DC
  Administered 2016-06-18 – 2016-06-20 (×4): 60 mg via INTRAVENOUS
  Filled 2016-06-18 (×4): qty 2

## 2016-06-18 NOTE — Progress Notes (Signed)
Inpatient Diabetes Program Recommendations  AACE/ADA: New Consensus Statement on Inpatient Glycemic Control (2015)  Target Ranges:  Prepandial:   less than 140 mg/dL      Peak postprandial:   less than 180 mg/dL (1-2 hours)      Critically ill patients:  140 - 180 mg/dL   Lab Results  Component Value Date   GLUCAP 276 (H) 06/18/2016   HGBA1C 6.1 (H) 06/17/2016    Review of Glycemic Control  Results for LASHARA, UREY (MRN [id number]) as of 06/18/2016 12:51  Ref. Range 06/17/2016 12:33 06/17/2016 17:56 06/17/2016 21:16 06/18/2016 07:25 06/18/2016 11:32  Glucose-Capillary Latest Ref Range: 65 - 99 mg/dL 287 (H) 321 (H) 117 (H) 243 (H) 276 (H)    Diabetes history: none Outpatient Diabetes medications: none Current orders for Inpatient glycemic control: Novolog 0-15 units tid, Novolog 0-5 units qhs * Solumedrol 60mg  q12h  Inpatient Diabetes Program Recommendations:  While on steroids- consider adding Novolog 3 units tid with meals- continue Novolog correction as ordered.  This will ensure the patient gets Novolog with each meal.  While the patient is on steroids, consider starting Lantus 10 units qhs (fasting blood sugar elevated this am despite hs blood sugar of 117mg /dl)  Gentry Fitz, RN, BA, MHA, CDE Diabetes Coordinator Inpatient Diabetes Program  [phone] (Team Pager) [phone] ([place]) 06/18/2016 1:01 PM

## 2016-06-18 NOTE — Progress Notes (Signed)
[facility] [place] NAME: Jenna [name]    MR#:  [id number]  DATE OF BIRTH:  [birth year]  SUBJECTIVE:   Patient still with wheezing sob improved as well as cough  REVIEW OF SYSTEMS:    Review of Systems  Constitutional: Negative for fever, chills weight loss HENT: Negative for ear pain, nosebleeds, congestion, facial swelling, rhinorrhea, neck pain, neck stiffness and ear discharge.   Respiratory: ++for cough, shortness of breath, wheezing  Cardiovascular: Negative for chest pain, palpitations and leg swelling.  Gastrointestinal: Negative for heartburn, abdominal pain, vomiting, diarrhea or consitpation Genitourinary: Negative for dysuria, urgency, frequency, hematuria Musculoskeletal: Negative for back pain or joint pain Neurological: Negative for dizziness, seizures, syncope, focal weakness,  numbness and headaches.  Hematological: Does not bruise/bleed easily.  Psychiatric/Behavioral: Negative for hallucinations, confusion, dysphoric mood    Tolerating Diet: yes      DRUG ALLERGIES:   Allergies  Allergen Reactions  . Azithromycin Other (See Comments)    Causes stomach burning pains  . Codeine Other (See Comments)    "will not stay down"  . Hydromorphone Other (See Comments)    confusion, personality change  . Morphine Other (See Comments)    Goes crazy  . Nsaids Other (See Comments)    Avoids because of gastric bypass surgery  . Oxycodone Nausea And Vomiting  . Oxycodone-Acetaminophen Nausea And Vomiting  . Nabumetone Rash  . Promethazine Hcl Rash and Other (See Comments)    VITALS:  Blood pressure (!) 115/50, pulse 74, temperature 97.4 F (36.3 C), temperature source Oral, resp. rate 20, height 5\' 2"  (1.575 m), weight 98.6 kg (217 lb 4.8 oz), SpO2 96 %.  PHYSICAL EXAMINATION:  Constitutional: Appears well-developed and well-nourished. No distress. HENT: Normocephalic. Marland Kitchen Oropharynx is clear and moist.  Eyes:  Conjunctivae and EOM are normal. PERRLA, no scleral icterus.  Neck: Normal ROM. Neck supple. No JVD. No tracheal deviation. CVS: RRR, S1/S2 +, no murmurs, no gallops, no carotid bruit.  Pulmonary:b/l wheezing without rales. Good breath sounds Abdominal: Soft. BS +,  no distension, tenderness, rebound or guarding.  Musculoskeletal: Normal range of motion. No edema and no tenderness.  Neuro: Alert. CN 2-12 grossly intact. No focal deficits. Skin: Skin is warm and dry. No rash noted. Psychiatric: Normal mood and affect.      LABORATORY PANEL:   CBC  Recent Labs Lab 06/17/16 0326  WBC 7.3  HGB 12.4  HCT 35.5  PLT 180   ------------------------------------------------------------------------------------------------------------------  Chemistries   Recent Labs Lab 06/16/16 1921  06/18/16 0500  NA 141  < > 138  K 3.0*  < > 4.0  CL 106  < > 105  CO2 26  < > 24  GLUCOSE 150*  < > 257*  BUN 14  < > 29*  CREATININE 0.73  < > 0.97  CALCIUM 8.6*  < > 8.9  MG 1.4*  --   --   < > = values in this interval not displayed. ------------------------------------------------------------------------------------------------------------------  Cardiac Enzymes  Recent Labs Lab 06/16/16 1921  TROPONINI <0.03   ------------------------------------------------------------------------------------------------------------------  RADIOLOGY:  Ct Angio Chest Pe W Or Wo Contrast  Result Date: 06/17/2016 CLINICAL DATA:  81 year old female with shortness of breath and wheezing. History of COPD and asthma. Recent travel. EXAM: CT ANGIOGRAPHY CHEST WITH CONTRAST TECHNIQUE: Multidetector CT imaging of the chest was performed using the standard protocol during bolus administration of intravenous contrast. Multiplanar CT image reconstructions and MIPs were obtained to evaluate  the vascular anatomy. CONTRAST:  75 cc Isovue 370 COMPARISON:  Chest radiograph dated 06/16/2016 FINDINGS: Cardiovascular:  Top-normal cardiac size. There is a small pericardial effusion. There is mild atherosclerotic calcification of the thoracic aorta. No aneurysmal dilatation or evidence of dissection. The origins of the great vessels of the aortic arch appear patent. Evaluation of the pulmonary arteries is limited due to respiratory motion artifact and suboptimal opacification of the distal branches. No large or central pulmonary artery embolus identified. Mediastinum/Nodes: There is no hilar or mediastinal adenopathy. There is air within the esophagus. Esophagus is otherwise unremarkable. No discrete thyroid nodules identified. Lungs/Pleura: Lungs are clear. No pleural effusion or pneumothorax. Upper Abdomen: There is postsurgical changes of gastric bypass. The visualized upper abdomen is otherwise unremarkable. Musculoskeletal: Degenerative changes of the spine. No acute osseous pathology. Review of the MIP images confirms the above findings. IMPRESSION: No acute intrathoracic pathology. No CT evidence of pulmonary embolus. Electronically Signed   By: Anner Crete M.D.   On: 06/17/2016 00:40   Dg Chest Port 1 View  Result Date: 06/16/2016 CLINICAL DATA:  Bronchitis, productive cough, low oxygen saturation, history hypertension, asthma, GERD, COPD, diabetes mellitus EXAM: PORTABLE CHEST 1 VIEW COMPARISON:  Portable exam 1921 hours compared 09/20/2015 FINDINGS: Upper normal heart size. Mediastinal contours and pulmonary vascularity normal. Minimal bibasilar atelectasis and central peribronchial thickening. No definite infiltrate, pleural effusion or pneumothorax. Bones demineralized with scattered endplate spur formation thoracic spine. BILATERAL AC joint degenerative changes. IMPRESSION: Minimal bronchitic changes and bibasilar atelectasis. Electronically Signed   By: Lavonia Dana M.D.   On: 06/16/2016 19:37     ASSESSMENT AND PLAN:   81 year old female with past medical history significant for COPD, asthma not on home  oxygen, diabetes, GERD, hypertension and obstructive sleep apnea status post bypass surgery presents to emergency room secondary to acute on chronic COPD exacerbation.   1. Acute hypoxic respiratory failure in the setting of acute on chronic COPD exacerbation with acute bronchitis  Wean oxygen as tolerated  2. Acute on chronic COPD exacerbation with acute bronchitis: Patient reports improved symptoms Wean IV steroids Continue Ceftin for acute bronchitis Continue nebs   3. Diet-controlled diabetes: Hemoglobin A1c 6.1 Continue sliding scale  4. Hyperlipidemia: Continue statin  Physical therapy consultation for disposition  Management plans discussed with the patient and she is in agreement.  CODE STATUS: full  TOTAL TIME TAKING CARE OF THIS PATIENT: 30 minutes.     POSSIBLE D/C 2 days, DEPENDING ON CLINICAL CONDITION.   Carmelite Violet M.D on 06/18/2016 at 9:10 AM  Between 7am to 6pm - Pager - [phone] After 6pm go to [email or web address] - password [email or web address] [facility]  Office  [phone]  CC: Primary care physician; Sofie Hartigan, MD  Note: This dictation was prepared with Dragon dictation along with smaller phrase technology. Any transcriptional errors that result from this process are unintentional.

## 2016-06-18 NOTE — Progress Notes (Signed)
Physical Therapy Treatment Patient Details Name: Jenna [name] MRN: [id number] DOB: 09-Feb-1935 Today's Date: 06/18/2016    History of Present Illness Pt admitted for COPD exacerbation. Pt with complaints of SOB symptoms. Pt with history of asthma, COPD, DM and depression. Pt reports recent falls    PT Comments    Pt is making good progress towards goals, however continues to be limited by SOB symptoms with exertion. O2 sats WNL with all mobility, needs rest breaks between each there-ex. Takes less time for recovery of symptoms this date. Pt appears motivated to participate in therapy. Will continue to progress.   Follow Up Recommendations  Outpatient PT     Equipment Recommendations  None recommended by PT    Recommendations for Other Services       Precautions / Restrictions Precautions Precautions: Fall Restrictions Weight Bearing Restrictions: No    Mobility  Bed Mobility Overal bed mobility: Needs Assistance Bed Mobility: Supine to Sit     Supine to sit: Supervision     General bed mobility comments: safe technique performed with upright posture.  Transfers Overall transfer level: Needs assistance Equipment used: None Transfers: Sit to/from Stand Sit to Stand: Supervision         General transfer comment: safe technique performed with upright posture. No AD  Ambulation/Gait Ambulation/Gait assistance: Min guard Ambulation Distance (Feet): 20 Feet Assistive device: None Gait Pattern/deviations: Step-to pattern     General Gait Details: slow step to gait pattern with SOB symptoms with minimal exertion. All mobility performed on 2L of O2 with sats WNL. Pt occasionally reaching out for furniture during ambulation, however no LOB noted.   Stairs            Wheelchair Mobility    Modified Rankin (Stroke Patients Only)       Balance                                            Cognition Arousal/Alertness:  Awake/alert Behavior During Therapy: WFL for tasks assessed/performed Overall Cognitive Status: Within Functional Limits for tasks assessed                                        Exercises Other Exercises Other Exercises: Supine/seated ther-ex including B LE including SLR and seated LAQ. All ther-ex performed x 10 reps with supervision and cues for performance.    General Comments        Pertinent Vitals/Pain Pain Assessment: No/denies pain    Home Living                      Prior Function            PT Goals (current goals can now be found in the care plan section) Acute Rehab PT Goals Patient Stated Goal: to go home PT Goal Formulation: With patient Time For Goal Achievement: 07/01/16 Potential to Achieve Goals: Good Progress towards PT goals: Progressing toward goals    Frequency    Min 2X/week      PT Plan Current plan remains appropriate    Co-evaluation              AM-PAC PT "6 Clicks" Daily Activity  Outcome Measure  Difficulty turning over in bed (including adjusting bedclothes, sheets and  blankets)?: None Difficulty moving from lying on back to sitting on the side of the bed? : None Difficulty sitting down on and standing up from a chair with arms (e.g., wheelchair, bedside commode, etc,.)?: None Help needed moving to and from a bed to chair (including a wheelchair)?: A Little Help needed walking in hospital room?: A Little Help needed climbing 3-5 steps with a railing? : A Little 6 Click Score: 21    End of Session Equipment Utilized During Treatment: Oxygen Activity Tolerance: Treatment limited secondary to medical complications (Comment) Patient left: in chair;with nursing/sitter in room Nurse Communication: Mobility status PT Visit Diagnosis: Repeated falls (R29.6);Unsteadiness on feet (R26.81)     Time: 3744-5146 PT Time Calculation (min) (ACUTE ONLY): 17 min  Charges:  $Gait Training: 8-22 mins                     G Codes:       Greggory Stallion, PT, DPT [phone]    Jenna [name] 06/18/2016, 11:47 AM

## 2016-06-19 LAB — CULTURE, RESPIRATORY W GRAM STAIN

## 2016-06-19 LAB — GLUCOSE, CAPILLARY
Glucose-Capillary: 215 mg/dL — ABNORMAL HIGH (ref 65–99)
Glucose-Capillary: 170 mg/dL — ABNORMAL HIGH (ref 65–99)
Glucose-Capillary: 163 mg/dL — ABNORMAL HIGH (ref 65–99)
Glucose-Capillary: 191 mg/dL — ABNORMAL HIGH (ref 65–99)

## 2016-06-19 MED ORDER — PREDNISONE 10 MG PO TABS: 10.0000 mg | ORAL_TABLET | Freq: Every day | ORAL | 0 refills | Status: DC

## 2016-06-19 MED ORDER — INSULIN ASPART 100 UNIT/ML ~~LOC~~ SOLN
3.0000 [IU] | Freq: Three times a day (TID) | SUBCUTANEOUS | Status: DC
  Administered 2016-06-19 – 2016-06-23 (×10): 3 [IU] via SUBCUTANEOUS
  Filled 2016-06-19 (×11): qty 3

## 2016-06-19 MED ORDER — CLINDAMYCIN HCL 150 MG PO CAPS
300.0000 mg | ORAL_CAPSULE | Freq: Three times a day (TID) | ORAL | Status: DC
  Administered 2016-06-19: 300 mg via ORAL
  Filled 2016-06-19: qty 2

## 2016-06-19 MED ORDER — INSULIN GLARGINE 100 UNIT/ML ~~LOC~~ SOLN
10.0000 [IU] | Freq: Every day | SUBCUTANEOUS | Status: DC
  Administered 2016-06-19 – 2016-06-22 (×4): 10 [IU] via SUBCUTANEOUS
  Filled 2016-06-19 (×5): qty 0.1

## 2016-06-19 MED ORDER — RANITIDINE HCL 150 MG PO TABS: 150.0000 mg | ORAL_TABLET | Freq: Two times a day (BID) | ORAL | Status: DC

## 2016-06-19 MED ORDER — GUAIFENESIN 100 MG/5ML PO SOLN
15.0000 mL | Freq: Four times a day (QID) | ORAL | Status: DC
  Administered 2016-06-19 – 2016-06-21 (×7): 300 mg via ORAL
  Filled 2016-06-19 (×5): qty 20
  Filled 2016-06-19 (×2): qty 15
  Filled 2016-06-19 (×2): qty 20

## 2016-06-19 MED ORDER — LEVOFLOXACIN 250 MG PO TABS
500.0000 mg | ORAL_TABLET | Freq: Every day | ORAL | Status: DC
  Administered 2016-06-20 – 2016-06-23 (×4): 500 mg via ORAL
  Filled 2016-06-19 (×4): qty 2

## 2016-06-19 NOTE — Progress Notes (Signed)
Physical Therapy Treatment Patient Details Name: Jenna [name] MRN: [id number] DOB: [birth year] Today's Date: 06/19/2016    History of Present Illness Pt admitted for COPD exacerbation. Pt with complaints of SOB symptoms. Pt with history of asthma, COPD, DM and depression. Pt reports recent falls    PT Comments    Pt demonstrates continued DOE with HR increasing to 125 bpm during ambulation but SaO2 remains at or above 95% throughout. Pt with fatigue which limits her ambulation distance. She is able to complete seated exercises as well but with increase in fatigue. Pt will benefit from skilled PT services to address deficits in strength, balance, and mobility in order to return to full function at home.    Follow Up Recommendations  Outpatient PT     Equipment Recommendations  None recommended by PT    Recommendations for Other Services       Precautions / Restrictions Precautions Precautions: Fall Restrictions Weight Bearing Restrictions: No    Mobility  Bed Mobility               General bed mobility comments: Received and left upright in recliner  Transfers Overall transfer level: Needs assistance Equipment used: None Transfers: Sit to/from Stand Sit to Stand: Supervision         General transfer comment: safe technique performed with upright posture. No AD  Ambulation/Gait Ambulation/Gait assistance: Min guard Ambulation Distance (Feet): 150 Feet Assistive device: None Gait Pattern/deviations: Step-to pattern Gait velocity: Decreased Gait velocity interpretation: <1.8 ft/sec, indicative of risk for recurrent falls General Gait Details: Pt able to ambulate out in hallway with therapist. SaO2 remains at or above 95% during ambulation but HR increases from upper 90's at rest to 125 bpm during ambulation. Pt with obvious respiratory distress and wheezing. Fatigues relatively quickly but improved from prior sessions   Stairs            Wheelchair  Mobility    Modified Rankin (Stroke Patients Only)       Balance Overall balance assessment: Needs assistance Sitting-balance support: Feet supported Sitting balance-Leahy Scale: Normal     Standing balance support: No upper extremity supported Standing balance-Leahy Scale: Good                              Cognition Arousal/Alertness: Awake/alert Behavior During Therapy: WFL for tasks assessed/performed Overall Cognitive Status: Within Functional Limits for tasks assessed                                        Exercises General Exercises - Lower Extremity Long Arc Quad: Strengthening;Both;15 reps;Seated Heel Slides: Strengthening;Both;15 reps;Seated Hip ABduction/ADduction: Strengthening;Both;15 reps;Seated Hip Flexion/Marching: Strengthening;Both;15 reps;Seated Heel Raises: Strengthening;Both;15 reps;Seated    General Comments        Pertinent Vitals/Pain Pain Assessment: No/denies pain    Home Living                      Prior Function            PT Goals (current goals can now be found in the care plan section) Acute Rehab PT Goals Patient Stated Goal: to go home PT Goal Formulation: With patient Time For Goal Achievement: 07/01/16 Potential to Achieve Goals: Good Progress towards PT goals: Progressing toward goals    Frequency    Min 2X/week  PT Plan Current plan remains appropriate    Co-evaluation              AM-PAC PT "6 Clicks" Daily Activity  Outcome Measure  Difficulty turning over in bed (including adjusting bedclothes, sheets and blankets)?: None Difficulty moving from lying on back to sitting on the side of the bed? : None Difficulty sitting down on and standing up from a chair with arms (e.g., wheelchair, bedside commode, etc,.)?: None Help needed moving to and from a bed to chair (including a wheelchair)?: A Little Help needed walking in hospital room?: A Little Help needed  climbing 3-5 steps with a railing? : A Little 6 Click Score: 21    End of Session Equipment Utilized During Treatment: Oxygen Activity Tolerance: Treatment limited secondary to medical complications (Comment) Patient left: in chair;with nursing/sitter in room;with call bell/phone within reach;with family/visitor present Nurse Communication: Mobility status PT Visit Diagnosis: Repeated falls (R29.6);Unsteadiness on feet (R26.81)     Time: 6599-3570 PT Time Calculation (min) (ACUTE ONLY): 14 min  Charges:  $Therapeutic Exercise: 8-22 mins                    G Codes:       Lyndel Safe Lanay Zinda PT, DPT     Lizzett Nobile 06/19/2016, 5:26 PM

## 2016-06-19 NOTE — Discharge Summary (Signed)
[facility] [place] NAME: Kayana Thoen    MR#:  [id number]  DATE OF BIRTH:  [birth year]  DATE OF ADMISSION:  06/16/2016 ADMITTING PHYSICIAN: Harvie Bridge, DO  DATE OF DISCHARGE: 06/19/2016  PRIMARY CARE PHYSICIAN: Sofie Hartigan, MD    ADMISSION DIAGNOSIS:  COPD exacerbation ([place]) [J44.1]  DISCHARGE DIAGNOSIS:  Active Problems:   COPD exacerbation ([place])   SECONDARY DIAGNOSIS:   Past Medical History:  Diagnosis Date  . Arthritis    neck, knees(before replacements)  . Asthma   . COPD (chronic obstructive pulmonary disease) ([place])   . Depression   . Encounter for colonoscopy due to history of adenomatous colonic polyps   . GERD (gastroesophageal reflux disease)   . Hearing loss of both ears   . Hyperlipidemia   . Hypertension   . Palpitations   . Sleep apnea    resolved with gastric bypass  . Syncope and collapse     HOSPITAL COURSE:  81 year old female with past medical history significant for COPD, asthma not on home oxygen, diabetes, GERD, hypertension and obstructive sleep apnea status post bypass surgery presents to emergency room secondary to acute on chronic COPD exacerbation.   1. Acute hypoxic respiratory failure in the setting of acute on chronic COPD exacerbation with acute bronchitis Patient has marked improvement since admission  2. Acute on chronic COPD exacerbation with acute bronchitis: Patient reports improved symptoms She will be discharged on oral steroid taper She may Continue Ceftin for acute bronchitis and Continue nebulizer treatments at home.   3. Diet-controlled diabetes: Hemoglobin A1c 6.1 She will need follow-up with her primary care physician. Continue ADA diet   4. Hyperlipidemia: Continue statin   DISCHARGE CONDITIONS AND DIET:   Stable for discharge on ADA diet  CONSULTS OBTAINED:    DRUG ALLERGIES:   Allergies  Allergen Reactions  . Azithromycin Other (See Comments)     Causes stomach burning pains  . Codeine Other (See Comments)    "will not stay down"  . Hydromorphone Other (See Comments)    confusion, personality change  . Morphine Other (See Comments)    Goes crazy  . Nsaids Other (See Comments)    Avoids because of gastric bypass surgery  . Oxycodone Nausea And Vomiting  . Oxycodone-Acetaminophen Nausea And Vomiting  . Nabumetone Rash  . Promethazine Hcl Rash and Other (See Comments)    DISCHARGE MEDICATIONS:   Current Discharge Medication List    START taking these medications   Details  predniSONE (DELTASONE) 10 MG tablet Take 1 tablet (10 mg total) by mouth daily with breakfast. 60 mg PO (ORAL) x 2 days 50 mg PO (ORAL)  x 2 days 40 mg PO (ORAL)  x 2 days 30 mg PO  (ORAL)  x 2 days 20 mg PO  (ORAL) x 2 days 10 mg PO  (ORAL) x 2 days then stop Qty: 42 tablet, Refills: 0      CONTINUE these medications which have CHANGED   Details  ranitidine (ZANTAC) 150 MG tablet Take 1 tablet (150 mg total) by mouth 2 (two) times daily.      CONTINUE these medications which have NOT CHANGED   Details  albuterol (PROVENTIL HFA;VENTOLIN HFA) 108 (90 BASE) MCG/ACT inhaler Inhale 2 puffs into the lungs every 6 (six) hours as needed for wheezing or shortness of breath. Qty: 1 Inhaler, Refills: 2    Ascorbic Acid (VITAMIN C) 1000 MG tablet Take 500 mg by mouth  daily.     benzonatate (TESSALON) 100 MG capsule Take 100-200 mg by mouth 3 (three) times daily as needed for cough.    cefdinir (OMNICEF) 300 MG capsule Take 300 mg by mouth 2 (two) times daily.    Cholecalciferol (VITAMIN D-1000 MAX ST) 1000 UNITS tablet Take 1,000 Units by mouth daily.     citalopram (CELEXA) 40 MG tablet Take 40 mg by mouth daily.     Cyanocobalamin (RA VITAMIN B-12 TR) 1000 MCG TBCR Take 1,000 mcg by mouth daily.     ferrous sulfate 325 (65 FE) MG EC tablet Take 325 mg by mouth at bedtime.     furosemide (LASIX) 20 MG tablet Take 20 mg by mouth daily.      ipratropium (ATROVENT) 0.02 % nebulizer solution Take 0.5 mg by nebulization 4 (four) times daily.    Multiple Vitamins-Minerals (ONE-A-DAY WOMENS PETITES) TABS Take 2 tablets by mouth every morning.    Omega-3 Fatty Acids (FISH OIL) 1000 MG CAPS Take 1,000 mg by mouth daily.    simvastatin (ZOCOR) 40 MG tablet Take 40 mg by mouth daily.           Today   CHIEF COMPLAINT:  Patient with much improved symptoms. Still some wheezing and cough however overall improved   VITAL SIGNS:  Blood pressure (!) 149/80, pulse 83, temperature 97.2 F (36.2 C), temperature source Oral, resp. rate 20, height 5\' 2"  (1.575 m), weight 98.6 kg (217 lb 4.8 oz), SpO2 97 %.   REVIEW OF SYSTEMS:  Review of Systems  Constitutional: Negative.  Negative for chills, fever and malaise/fatigue.  HENT: Negative.  Negative for ear discharge, ear pain, hearing loss, nosebleeds and sore throat.   Eyes: Negative.  Negative for blurred vision and pain.  Respiratory: Positive for cough. Negative for hemoptysis, shortness of breath (improved) and wheezing (imporved).   Cardiovascular: Negative.  Negative for chest pain, palpitations and leg swelling.  Gastrointestinal: Negative.  Negative for abdominal pain, blood in stool, diarrhea, nausea and vomiting.  Genitourinary: Negative.  Negative for dysuria.  Musculoskeletal: Negative.  Negative for back pain.  Skin: Negative.   Neurological: Negative for dizziness, tremors, speech change, focal weakness, seizures and headaches.  Endo/Heme/Allergies: Negative.  Does not bruise/bleed easily.  Psychiatric/Behavioral: Negative.  Negative for depression, hallucinations and suicidal ideas.     PHYSICAL EXAMINATION:  GENERAL:  81 y.o.-year-old patient lying in the bed with no acute distress.  NECK:  Supple, no jugular venous distention. No thyroid enlargement, no tenderness.  LUNGS: Normal breath sounds bilaterally, good airflow with minimal expiratory rales,rhonchi  No  use of accessory muscles of respiration.  CARDIOVASCULAR: S1, S2 normal. No murmurs, rubs, or gallops.  ABDOMEN: Soft, non-tender, non-distended. Bowel sounds present. No organomegaly or mass.  EXTREMITIES: No pedal edema, cyanosis, or clubbing.  PSYCHIATRIC: The patient is alert and oriented x 3.  SKIN: No obvious rash, lesion, or ulcer.   DATA REVIEW:   CBC  Recent Labs Lab 06/17/16 0326  WBC 7.3  HGB 12.4  HCT 35.5  PLT 180    Chemistries   Recent Labs Lab 06/16/16 1921  06/18/16 0500  NA 141  < > 138  K 3.0*  < > 4.0  CL 106  < > 105  CO2 26  < > 24  GLUCOSE 150*  < > 257*  BUN 14  < > 29*  CREATININE 0.73  < > 0.97  CALCIUM 8.6*  < > 8.9  MG 1.4*  --   --   < > =  values in this interval not displayed.  Cardiac Enzymes  Recent Labs Lab 06/16/16 1921  TROPONINI <0.03    Microbiology Results  @MICRORSLT48 @  RADIOLOGY:  No results found.    Current Discharge Medication List    START taking these medications   Details  predniSONE (DELTASONE) 10 MG tablet Take 1 tablet (10 mg total) by mouth daily with breakfast. 60 mg PO (ORAL) x 2 days 50 mg PO (ORAL)  x 2 days 40 mg PO (ORAL)  x 2 days 30 mg PO  (ORAL)  x 2 days 20 mg PO  (ORAL) x 2 days 10 mg PO  (ORAL) x 2 days then stop Qty: 42 tablet, Refills: 0      CONTINUE these medications which have CHANGED   Details  ranitidine (ZANTAC) 150 MG tablet Take 1 tablet (150 mg total) by mouth 2 (two) times daily.      CONTINUE these medications which have NOT CHANGED   Details  albuterol (PROVENTIL HFA;VENTOLIN HFA) 108 (90 BASE) MCG/ACT inhaler Inhale 2 puffs into the lungs every 6 (six) hours as needed for wheezing or shortness of breath. Qty: 1 Inhaler, Refills: 2    Ascorbic Acid (VITAMIN C) 1000 MG tablet Take 500 mg by mouth daily.     benzonatate (TESSALON) 100 MG capsule Take 100-200 mg by mouth 3 (three) times daily as needed for cough.    cefdinir (OMNICEF) 300 MG capsule Take 300 mg by  mouth 2 (two) times daily.    Cholecalciferol (VITAMIN D-1000 MAX ST) 1000 UNITS tablet Take 1,000 Units by mouth daily.     citalopram (CELEXA) 40 MG tablet Take 40 mg by mouth daily.     Cyanocobalamin (RA VITAMIN B-12 TR) 1000 MCG TBCR Take 1,000 mcg by mouth daily.     ferrous sulfate 325 (65 FE) MG EC tablet Take 325 mg by mouth at bedtime.     furosemide (LASIX) 20 MG tablet Take 20 mg by mouth daily.     ipratropium (ATROVENT) 0.02 % nebulizer solution Take 0.5 mg by nebulization 4 (four) times daily.    Multiple Vitamins-Minerals (ONE-A-DAY WOMENS PETITES) TABS Take 2 tablets by mouth every morning.    Omega-3 Fatty Acids (FISH OIL) 1000 MG CAPS Take 1,000 mg by mouth daily.    simvastatin (ZOCOR) 40 MG tablet Take 40 mg by mouth daily.           Management plans discussed with the patient and she is in agreement. Stable for discharge home  Patient should follow up with pcp  CODE STATUS:     Code Status Orders        Start     Ordered   06/16/16 2247  Full code  Continuous     06/16/16 2246    Code Status History    Date Active Date Inactive Code Status Order ID Comments User Context   09/22/2015  4:17 PM 09/23/2015  3:55 PM Full Code [id number]  Lytle Butte, MD ED   06/13/2014  5:23 AM 06/14/2014  5:50 PM Full Code [id number]  Harrie Foreman, MD Inpatient      TOTAL TIME TAKING CARE OF THIS PATIENT: 37 minutes.    Note: This dictation was prepared with Dragon dictation along with smaller phrase technology. Any transcriptional errors that result from this process are unintentional.  Danecia Underdown M.D on 06/19/2016 at 8:29 AM  Between 7am to 6pm - Pager - [phone] After 6pm go to [email or web address] - password [email or web address]  [facility]  Office  [phone]  CC: Primary care physician; Sofie Hartigan, MD

## 2016-06-19 NOTE — Progress Notes (Addendum)
Per Dr. Benjie Karvonen okay to add robitussin at 39ml q6 hours scheduled.

## 2016-06-19 NOTE — Progress Notes (Signed)
SATURATION QUALIFICATIONS: (This note is used to comply with regulatory documentation for home oxygen)  Patient Saturations on Room Air at Rest = 99%  Patient Saturations on Room Air while Ambulating = 97%  Patient Saturations on 0 Liters of oxygen while Ambulating = 97%  Please briefly explain why patient needs home oxygen: Pt maintained oxygen sats at 97% while ambulating in the hallway. Pt did experience wheezing but no dramatic drop in pulse ox.

## 2016-06-19 NOTE — Care Management Important Message (Signed)
Important Message  Patient Details  Name: Jenna [name] MRN: [id number] Date of Birth: 1935/10/12   Medicare Important Message Given:  Yes    Beverly Sessions, RN 06/19/2016, 2:32 PM

## 2016-06-19 NOTE — Progress Notes (Addendum)
[facility] [place] NAME: Jenna [name]    MR#:  [id number]  DATE OF BIRTH:  10-25-1935  SUBJECTIVE:   Patient with improved symptoms  as well as cough  REVIEW OF SYSTEMS:    Review of Systems  Constitutional: Negative for fever, chills weight loss HENT: Negative for ear pain, nosebleeds, congestion, facial swelling, rhinorrhea, neck pain, neck stiffness and ear discharge.   Respiratory: ++for cough, shortness of breath, wheezing  Cardiovascular: Negative for chest pain, palpitations and leg swelling.  Gastrointestinal: Negative for heartburn, abdominal pain, vomiting, diarrhea or consitpation Genitourinary: Negative for dysuria, urgency, frequency, hematuria Musculoskeletal: Negative for back pain or joint pain Neurological: Negative for dizziness, seizures, syncope, focal weakness,  numbness and headaches.  Hematological: Does not bruise/bleed easily.  Psychiatric/Behavioral: Negative for hallucinations, confusion, dysphoric mood    Tolerating Diet: yes      DRUG ALLERGIES:   Allergies  Allergen Reactions  . Azithromycin Other (See Comments)    Causes stomach burning pains  . Codeine Other (See Comments)    "will not stay down"  . Hydromorphone Other (See Comments)    confusion, personality change  . Morphine Other (See Comments)    Goes crazy  . Nsaids Other (See Comments)    Avoids because of gastric bypass surgery  . Oxycodone Nausea And Vomiting  . Oxycodone-Acetaminophen Nausea And Vomiting  . Nabumetone Rash  . Promethazine Hcl Rash and Other (See Comments)    VITALS:  Blood pressure (!) 149/80, pulse 83, temperature 97.2 F (36.2 C), temperature source Oral, resp. rate 20, height 5\' 2"  (1.575 m), weight 98.6 kg (217 lb 4.8 oz), SpO2 97 %.  PHYSICAL EXAMINATION:  Constitutional: Appears well-developed and well-nourished. No distress. HENT: Normocephalic. Marland Kitchen Oropharynx is clear and moist.  Eyes: Conjunctivae and  EOM are normal. PERRLA, no scleral icterus.  Neck: Normal ROM. Neck supple. No JVD. No tracheal deviation. CVS: RRR, S1/S2 +, no murmurs, no gallops, no carotid bruit.  Pulmonary:b/l wheezing very minimal. Abdominal: Soft. BS +,  no distension, tenderness, rebound or guarding.  Musculoskeletal: Normal range of motion. No edema and no tenderness.  Neuro: Alert. CN 2-12 grossly intact. No focal deficits. Skin: Skin is warm and dry. No rash noted. Psychiatric: Normal mood and affect.      LABORATORY PANEL:   CBC  Recent Labs Lab 06/17/16 0326  WBC 7.3  HGB 12.4  HCT 35.5  PLT 180   ------------------------------------------------------------------------------------------------------------------  Chemistries   Recent Labs Lab 06/16/16 1921  06/18/16 0500  NA 141  < > 138  K 3.0*  < > 4.0  CL 106  < > 105  CO2 26  < > 24  GLUCOSE 150*  < > 257*  BUN 14  < > 29*  CREATININE 0.73  < > 0.97  CALCIUM 8.6*  < > 8.9  MG 1.4*  --   --   < > = values in this interval not displayed. ------------------------------------------------------------------------------------------------------------------  Cardiac Enzymes  Recent Labs Lab 06/16/16 1921  TROPONINI <0.03   ------------------------------------------------------------------------------------------------------------------  RADIOLOGY:  No results found.   ASSESSMENT AND PLAN:   81 year old female with past medical history significant for COPD, asthma not on home oxygen, diabetes, GERD, hypertension and obstructive sleep apnea status post bypass surgery presents to emergency room secondary to acute on chronic COPD exacerbation.   1. Acute hypoxic respiratory failure in the setting of acute on chronic COPD exacerbation with acute bronchitis Patient has marked improvement  since admission  2. Acute on chronic COPD exacerbation with acute bronchitis (Streptococcus oralis on sputum cx): Patient reports improved  symptoms, but not quite ready for discharge Continue IV steroids at current dose Change antibiotics to clindamycin as per sputum culture Continue inhalers D/w Dr Ola Spurr he will come and see patient and make final ABX RECS  3. Diet-controlled diabetes: Hemoglobin A1c 6.1 She will need follow-up with her primary care physician. Continue ADA diet   4. Hyperlipidemia: Continue statin  PT RECS outpatient PT.I have already ordered this as a future order in the computer.  Management plans discussed with the patient and she is in agreement.  CODE STATUS: full  TOTAL TIME TAKING CARE OF THIS PATIENT: 24 minutes.     POSSIBLE D/C tomorrow, DEPENDING ON CLINICAL CONDITION.   Kyreese Chio M.D on 06/19/2016 at 8:32 AM  Between 7am to 6pm - Pager - [phone] After 6pm go to [email or web address] - password [email or web address] [facility]  Office  [phone]  CC: Primary care physician; Sofie Hartigan, MD  Note: This dictation was prepared with Dragon dictation along with smaller phrase technology. Any transcriptional errors that result from this process are unintentional.

## 2016-06-19 NOTE — Consult Note (Signed)
[facility] Infectious Disease     Reason for Consult: PNA    Referring Physician: Bettey Costa Date of Admission:  06/16/2016   Active Problems:   COPD exacerbation (HCC)   HPI: Jenna [name] is a 81 y.o. female admitted with SOB and cough. She was treated with cefdinir at home but progressed with sats down to 80s. She has a  history of Asthma, COPD, diabetes, depression, GERD, hypertension, hyperlipidemia, obstructive sleep apnea, status post gastric bypass surgery. She has been admitted and had no wbc, no fevers. Treated for COPD exac. Sputum cx with strep mitis and oralis. She continues to have hoarseness dry cough and wheeze.    Past Medical History:  Diagnosis Date  . Arthritis    neck, knees(before replacements)  . Asthma   . COPD (chronic obstructive pulmonary disease) ([place])   . Depression   . Encounter for colonoscopy due to history of adenomatous colonic polyps   . GERD (gastroesophageal reflux disease)   . Hearing loss of both ears   . Hyperlipidemia   . Hypertension   . Palpitations   . Sleep apnea    resolved with gastric bypass  . Syncope and collapse    Past Surgical History:  Procedure Laterality Date  . BROW LIFT Bilateral 06/27/2015   Procedure: BLEPHAROPLASTY BILATERAL UPPER EYELIDS BILATERAL BLEPHAROTOSIS EYELIDS;  Surgeon: Karle Starch, MD;  Location: [facility];  Service: Ophthalmology;  Laterality: Bilateral;  BILATERAL  . CATARACT EXTRACTION W/PHACO Right 02/13/2015   Procedure: CATARACT EXTRACTION PHACO AND INTRAOCULAR LENS PLACEMENT (IOC);  Surgeon: Ronnell Freshwater, MD;  Location: [facility];  Service: Ophthalmology;  Laterality: Right;  . CATARACT EXTRACTION W/PHACO Left 03/22/2015   Procedure: CATARACT EXTRACTION PHACO AND INTRAOCULAR LENS PLACEMENT (IOC);  Surgeon: Ronnell Freshwater, MD;  Location: [facility];  Service: Ophthalmology;  Laterality: Left;  TORIC  . CHOLECYSTECTOMY  1984  . COLECTOMY    .  COSMETIC SURGERY  2012   tummy tuck and excess skin removal  . GASTRIC BYPASS  2010  . REPLACEMENT TOTAL KNEE BILATERAL  1998  . SHOULDER SURGERY  2009   left   . TONSILLECTOMY    . TOTAL ABDOMINAL HYSTERECTOMY  1979   Social History  Substance Use Topics  . Smoking status: Former Smoker    Packs/day: 0.25    Years: 15.00    Types: Cigarettes  . Smokeless tobacco: Never Used  . Alcohol use No   Family History  Problem Relation Age of Onset  . Heart attack Mother   . Hypertension Mother   . Heart attack Brother   . Heart attack Sister 62  . Hyperlipidemia Sister   . Hypertension Sister     Allergies:  Allergies  Allergen Reactions  . Azithromycin Other (See Comments)    Causes stomach burning pains  . Codeine Other (See Comments)    "will not stay down"  . Hydromorphone Other (See Comments)    confusion, personality change  . Morphine Other (See Comments)    Goes crazy  . Nsaids Other (See Comments)    Avoids because of gastric bypass surgery  . Oxycodone Nausea And Vomiting  . Oxycodone-Acetaminophen Nausea And Vomiting  . Nabumetone Rash  . Promethazine Hcl Rash and Other (See Comments)    Current antibiotics: Antibiotics Given (last 72 hours)    Date/Time Action Medication Dose   06/16/16 2340 Given   cefUROXime (CEFTIN) tablet 500 mg 500 mg   06/17/16 1100 Given  cefUROXime (CEFTIN) tablet 500 mg 500 mg   06/17/16 2149 Given   cefUROXime (CEFTIN) tablet 500 mg 500 mg   06/18/16 0842 Given   cefUROXime (CEFTIN) tablet 500 mg 500 mg   06/18/16 2130 Given   cefUROXime (CEFTIN) tablet 500 mg 500 mg   06/19/16 1017 Given   cefUROXime (CEFTIN) tablet 500 mg 500 mg   06/19/16 1402 Given   clindamycin (CLEOCIN) capsule 300 mg 300 mg      MEDICATIONS: . chlorpheniramine-HYDROcodone  5 mL Oral Q12H  . cholecalciferol  1,000 Units Oral Daily  . citalopram  40 mg Oral Daily  . clindamycin  300 mg Oral Q8H  . enoxaparin (LOVENOX) injection  40 mg  Subcutaneous Q24H  . famotidine  20 mg Oral BID  . ferrous sulfate  325 mg Oral QHS  . furosemide  20 mg Oral Daily  . guaiFENesin  15 mL Oral Q6H  . insulin aspart  0-15 Units Subcutaneous TID WC  . insulin aspart  0-5 Units Subcutaneous QHS  . insulin aspart  3 Units Subcutaneous TID WC  . insulin glargine  10 Units Subcutaneous Daily  . ipratropium-albuterol  3 mL Nebulization Q4H  . mouth rinse  15 mL Mouth Rinse BID  . methylPREDNISolone (SOLU-MEDROL) injection  60 mg Intravenous Q12H  . multivitamin with minerals  2 tablet Oral BH-q7a  . simvastatin  40 mg Oral Daily  . sodium chloride flush  3 mL Intravenous Q12H  . vitamin B-12  1,000 mcg Oral Daily  . vitamin C  500 mg Oral Daily    Review of Systems - 11 systems reviewed and negative per HPI   OBJECTIVE: Temp:  [97.2 F (36.2 C)-97.7 F (36.5 C)] 97.4 F (36.3 C) (05/30 1348) Pulse Rate:  [76-83] 83 (05/30 1411) Resp:  [20] 20 (05/30 0401) BP: (122-162)/(58-108) 162/67 (05/30 1411) SpO2:  [95 %-100 %] 98 % (05/30 1348) Physical Exam  Constitutional:  oriented to person, place, and time. Obese, hoarse,  HENT: [place]/AT, PERRLA, no scleral icterus Mouth/Throat: Oropharynx is clear and moist. No oropharyngeal exudate.  Cardiovascular: Normal rate, regular rhythm and normal heart sounds.  Pulmonary/Chest: labored respiration, mod exp wheeze   Neck = supple, no nuchal rigidity Abdominal: Soft. Bowel sounds are normal.  exhibits no distension. There is no tenderness.  Lymphadenopathy: no cervical adenopathy. No axillary adenopathy Neurological: alert and oriented to person, place, and time.  Skin: Skin is warm and dry. No rash noted. No erythema.  Psychiatric: a normal mood and affect.  behavior is normal.    LABS: Results for orders placed or performed during the hospital encounter of 06/16/16 (from the past 48 hour(s))  Glucose, capillary     Status: Abnormal   Collection Time: 06/17/16  5:56 PM  Result Value Ref  Range   Glucose-Capillary 321 (H) 65 - 99 mg/dL  Glucose, capillary     Status: Abnormal   Collection Time: 06/17/16  9:16 PM  Result Value Ref Range   Glucose-Capillary 117 (H) 65 - 99 mg/dL  Basic metabolic panel     Status: Abnormal   Collection Time: 06/18/16  5:00 AM  Result Value Ref Range   Sodium 138 135 - 145 mmol/L   Potassium 4.0 3.5 - 5.1 mmol/L   Chloride 105 101 - 111 mmol/L   CO2 24 22 - 32 mmol/L   Glucose, Bld 257 (H) 65 - 99 mg/dL   BUN 29 (H) 6 - 20 mg/dL   Creatinine, Ser 0.97  0.44 - 1.00 mg/dL   Calcium 8.9 8.9 - 10.3 mg/dL   GFR calc non Af Amer 54 (L) >60 mL/min   GFR calc Af Amer >60 >60 mL/min    Comment: (NOTE) The eGFR has been calculated using the CKD EPI equation. This calculation has not been validated in all clinical situations. eGFR's persistently <60 mL/min signify possible Chronic Kidney Disease.    Anion gap 9 5 - 15  Glucose, capillary     Status: Abnormal   Collection Time: 06/18/16  7:25 AM  Result Value Ref Range   Glucose-Capillary 243 (H) 65 - 99 mg/dL  Glucose, capillary     Status: Abnormal   Collection Time: 06/18/16 11:32 AM  Result Value Ref Range   Glucose-Capillary 276 (H) 65 - 99 mg/dL  Glucose, capillary     Status: Abnormal   Collection Time: 06/18/16  4:32 PM  Result Value Ref Range   Glucose-Capillary 139 (H) 65 - 99 mg/dL  Glucose, capillary     Status: Abnormal   Collection Time: 06/18/16  9:06 PM  Result Value Ref Range   Glucose-Capillary 215 (H) 65 - 99 mg/dL  Glucose, capillary     Status: Abnormal   Collection Time: 06/19/16  7:21 AM  Result Value Ref Range   Glucose-Capillary 215 (H) 65 - 99 mg/dL  Glucose, capillary     Status: Abnormal   Collection Time: 06/19/16 11:24 AM  Result Value Ref Range   Glucose-Capillary 170 (H) 65 - 99 mg/dL   No components found for: ESR, C REACTIVE PROTEIN MICRO: Recent Results (from the past 720 hour(s))  Culture, sputum-assessment     Status: None   Collection Time:  06/17/16  8:26 AM  Result Value Ref Range Status   Specimen Description SPUTUM  Final   Special Requests Immunocompromised  Final   Sputum evaluation THIS SPECIMEN IS ACCEPTABLE FOR SPUTUM CULTURE  Final   Report Status 06/17/2016 FINAL  Final  Culture, respiratory (NON-Expectorated)     Status: None   Collection Time: 06/17/16  8:26 AM  Result Value Ref Range Status   Specimen Description SPUTUM  Final   Special Requests Immunocompromised Reflexed from [id number]  Final   Gram Stain   Final    FEW WBC PRESENT, PREDOMINANTLY MONONUCLEAR FEW GRAM POSITIVE COCCI IN PAIRS FEW GRAM POSITIVE RODS Performed at [facility], [place] [address] [place], [place] [address]    Culture MODERATE STREPTOCOCCUS MITIS/ORALIS  Final   Report Status 06/19/2016 FINAL  Final   Organism ID, Bacteria STREPTOCOCCUS MITIS/ORALIS  Final      Susceptibility   Streptococcus mitis/oralis - MIC*    ERYTHROMYCIN 2 RESISTANT Resistant     TETRACYCLINE >=16 RESISTANT Resistant     VANCOMYCIN 0.5 SENSITIVE Sensitive     CLINDAMYCIN <=0.25 SENSITIVE Sensitive     * MODERATE STREPTOCOCCUS MITIS/ORALIS    IMAGING: Ct Angio Chest Pe W Or Wo Contrast  Result Date: 06/17/2016 CLINICAL DATA:  81 year old female with shortness of breath and wheezing. History of COPD and asthma. Recent travel. EXAM: CT ANGIOGRAPHY CHEST WITH CONTRAST TECHNIQUE: Multidetector CT imaging of the chest was performed using the standard protocol during bolus administration of intravenous contrast. Multiplanar CT image reconstructions and MIPs were obtained to evaluate the vascular anatomy. CONTRAST:  75 cc Isovue 370 COMPARISON:  Chest radiograph dated 06/16/2016 FINDINGS: Cardiovascular: Top-normal cardiac size. There is a small pericardial effusion. There is mild atherosclerotic calcification of the thoracic aorta. No aneurysmal dilatation or  evidence of dissection. The origins of the great vessels of the aortic arch appear patent. Evaluation of  the pulmonary arteries is limited due to respiratory motion artifact and suboptimal opacification of the distal branches. No large or central pulmonary artery embolus identified. Mediastinum/Nodes: There is no hilar or mediastinal adenopathy. There is air within the esophagus. Esophagus is otherwise unremarkable. No discrete thyroid nodules identified. Lungs/Pleura: Lungs are clear. No pleural effusion or pneumothorax. Upper Abdomen: There is postsurgical changes of gastric bypass. The visualized upper abdomen is otherwise unremarkable. Musculoskeletal: Degenerative changes of the spine. No acute osseous pathology. Review of the MIP images confirms the above findings. IMPRESSION: No acute intrathoracic pathology. No CT evidence of pulmonary embolus. Electronically Signed   By: Anner Crete M.D.   On: 06/17/2016 00:40   Dg Chest Port 1 View  Result Date: 06/16/2016 CLINICAL DATA:  Bronchitis, productive cough, low oxygen saturation, history hypertension, asthma, GERD, COPD, diabetes mellitus EXAM: PORTABLE CHEST 1 VIEW COMPARISON:  Portable exam 1921 hours compared 09/20/2015 FINDINGS: Upper normal heart size. Mediastinal contours and pulmonary vascularity normal. Minimal bibasilar atelectasis and central peribronchial thickening. No definite infiltrate, pleural effusion or pneumothorax. Bones demineralized with scattered endplate spur formation thoracic spine. BILATERAL AC joint degenerative changes. IMPRESSION: Minimal bronchitic changes and bibasilar atelectasis. Electronically Signed   By: Lavonia Dana M.D.   On: 06/16/2016 19:37    Assessment:   Jenna [name] is a 81 y.o. female with COPD, asthma admitted with sob and cough. CT and CXR negative for PNA but cx with strep mitis/oralis. She still has impressive wheeze but no fevers and wbc was normal on admit. Suspect bronchitis.   Recommendations Check resp PCR panel For the Strep mitis it is sensitive to levofloxacin so would suggest a 5-7 day  course of Levo. Continue treatment of COPD Thank you very much for allowing me to participate in the care of this patient. Please call with questions.   Cheral Marker. Ola Spurr, MD

## 2016-06-20 LAB — GLUCOSE, CAPILLARY
Glucose-Capillary: 228 mg/dL — ABNORMAL HIGH (ref 65–99)
Glucose-Capillary: 155 mg/dL — ABNORMAL HIGH (ref 65–99)
Glucose-Capillary: 135 mg/dL — ABNORMAL HIGH (ref 65–99)
Glucose-Capillary: 124 mg/dL — ABNORMAL HIGH (ref 65–99)

## 2016-06-20 LAB — RESPIRATORY PANEL BY PCR

## 2016-06-20 MED ORDER — METHYLPREDNISOLONE SODIUM SUCC 40 MG IJ SOLR
40.0000 mg | Freq: Three times a day (TID) | INTRAMUSCULAR | Status: DC
  Administered 2016-06-20 – 2016-06-22 (×6): 40 mg via INTRAVENOUS
  Filled 2016-06-20 (×6): qty 1

## 2016-06-20 NOTE — Progress Notes (Signed)
[facility] at [place] NAME: Jenna [name]    MR#:  [id number]  DATE OF BIRTH:  28-Jan-1935  SUBJECTIVE:   Patient with improved symptoms  as well as cough,But still wheezing  REVIEW OF SYSTEMS:    Review of Systems  Constitutional: Negative for fever, chills weight loss HENT: Negative for ear pain, nosebleeds, congestion, facial swelling, rhinorrhea, neck pain, neck stiffness and ear discharge.   Respiratory: ++for cough, shortness of breath, wheezing  Cardiovascular: Negative for chest pain, palpitations and leg swelling.  Gastrointestinal: Negative for heartburn, abdominal pain, vomiting, diarrhea or consitpation Genitourinary: Negative for dysuria, urgency, frequency, hematuria Musculoskeletal: Negative for back pain or joint pain Neurological: Negative for dizziness, seizures, syncope, focal weakness,  numbness and headaches.  Hematological: Does not bruise/bleed easily.  Psychiatric/Behavioral: Negative for hallucinations, confusion, dysphoric mood    Tolerating Diet: yes      DRUG ALLERGIES:   Allergies  Allergen Reactions  . Azithromycin Other (See Comments)    Causes stomach burning pains  . Codeine Other (See Comments)    "will not stay down"  . Hydromorphone Other (See Comments)    confusion, personality change  . Morphine Other (See Comments)    Goes crazy  . Nsaids Other (See Comments)    Avoids because of gastric bypass surgery  . Oxycodone Nausea And Vomiting  . Oxycodone-Acetaminophen Nausea And Vomiting  . Nabumetone Rash  . Promethazine Hcl Rash and Other (See Comments)    VITALS:  Blood pressure 132/64, pulse 95, temperature 97.8 F (36.6 C), temperature source Oral, resp. rate (!) 22, height 5\' 2"  (1.575 m), weight 98.6 kg (217 lb 4.8 oz), SpO2 97 %.  PHYSICAL EXAMINATION:  Constitutional: Appears well-developed and well-nourished. No distress. HENT: Normocephalic. Marland Kitchen Oropharynx is clear and moist.   Eyes: Conjunctivae and EOM are normal. PERRLA, no scleral icterus.  Neck: Normal ROM. Neck supple. No JVD. No tracheal deviation. CVS: RRR, S1/S2 +, no murmurs, no gallops, no carotid bruit.  Pulmonary:b/l wheezing , diminished breath sounds bilaterally Abdominal: Soft. BS +,  no distension, tenderness, rebound or guarding.  Musculoskeletal: Normal range of motion. No edema and no tenderness.  Neuro: Alert. CN 2-12 grossly intact. No focal deficits. Skin: Skin is warm and dry. No rash noted. Psychiatric: Normal mood and affect.      LABORATORY PANEL:   CBC  Recent Labs Lab 06/17/16 0326  WBC 7.3  HGB 12.4  HCT 35.5  PLT 180   ------------------------------------------------------------------------------------------------------------------  Chemistries   Recent Labs Lab 06/16/16 1921  06/18/16 0500  NA 141  < > 138  K 3.0*  < > 4.0  CL 106  < > 105  CO2 26  < > 24  GLUCOSE 150*  < > 257*  BUN 14  < > 29*  CREATININE 0.73  < > 0.97  CALCIUM 8.6*  < > 8.9  MG 1.4*  --   --   < > = values in this interval not displayed. ------------------------------------------------------------------------------------------------------------------  Cardiac Enzymes  Recent Labs Lab 06/16/16 1921  TROPONINI <0.03   ------------------------------------------------------------------------------------------------------------------  RADIOLOGY:  No results found.   ASSESSMENT AND PLAN:   81 year old female with past medical history significant for COPD, asthma not on home oxygen, diabetes, GERD, hypertension and obstructive sleep apnea status post bypass surgery presents to emergency room secondary to acute on chronic COPD exacerbation.   1. Acute hypoxic respiratory failure in the setting of acute on chronic COPD exacerbation with acute  bronchitis Patient has marked improvement since admission but still wheezing  2. Acute on chronic COPD exacerbation with acute  bronchitis (Streptococcus oralis on sputum cx): Patient reports improved symptoms, but not quite ready for discharge Continue IV steroids at 40 mg IV every 8 hours  D/c clindamycin  Continue inhalers D/w Dr Ola Spurr has recommended levofloxacin for 5-7 days   3. Diet-controlled diabetes: Hemoglobin A1c 6.1 She will need follow-up with her primary care physician. Continue ADA diet   4. Hyperlipidemia: Continue statin  PT RECS outpatient PT.I have already ordered this as a future order in the computer.  Management plans discussed with the patient and she is in agreement.  CODE STATUS: full  TOTAL TIME TAKING CARE OF THIS PATIENT: 33 minutes.     POSSIBLE 1-2 days tomorrow, DEPENDING ON CLINICAL CONDITION.   Nicholes Mango M.D on 06/20/2016 at 2:23 PM  Between 7am to 6pm - Pager - [phone] After 6pm go to [email or web address] - password [email or web address] [facility]  Office  [phone]  CC: Primary care physician; Sofie Hartigan, MD  Note: This dictation was prepared with Dragon dictation along with smaller phrase technology. Any transcriptional errors that result from this process are unintentional.

## 2016-06-21 ENCOUNTER — Inpatient Hospital Stay: Payer: Medicare Other

## 2016-06-21 LAB — GLUCOSE, CAPILLARY
Glucose-Capillary: 200 mg/dL — ABNORMAL HIGH (ref 65–99)
Glucose-Capillary: 134 mg/dL — ABNORMAL HIGH (ref 65–99)
Glucose-Capillary: 218 mg/dL — ABNORMAL HIGH (ref 65–99)
Glucose-Capillary: 80 mg/dL (ref 65–99)

## 2016-06-21 MED ORDER — BENZONATATE 100 MG PO CAPS
100.0000 mg | ORAL_CAPSULE | Freq: Three times a day (TID) | ORAL | Status: DC
  Administered 2016-06-21 – 2016-06-23 (×6): 100 mg via ORAL
  Filled 2016-06-21 (×6): qty 1

## 2016-06-21 MED ORDER — RACEPINEPHRINE HCL 2.25 % IN NEBU
0.5000 mL | INHALATION_SOLUTION | RESPIRATORY_TRACT | Status: DC | PRN
  Administered 2016-06-21 – 2016-06-23 (×7): 0.5 mL via RESPIRATORY_TRACT
  Filled 2016-06-21 (×8): qty 0.5

## 2016-06-21 MED ORDER — SODIUM CHLORIDE 0.9 % IV SOLN
INTRAVENOUS | Status: AC
  Administered 2016-06-21 (×2): via INTRAVENOUS

## 2016-06-21 MED ORDER — GUAIFENESIN-DM 100-10 MG/5ML PO SYRP
10.0000 mL | ORAL_SOLUTION | Freq: Four times a day (QID) | ORAL | Status: DC
  Administered 2016-06-21 – 2016-06-23 (×9): 10 mL via ORAL
  Filled 2016-06-21 (×9): qty 10

## 2016-06-21 NOTE — Progress Notes (Signed)
Per Infection prevention RN okay for pt to come off droplet. Per Dr. Margaretmary Eddy she did not want to discontinue order at this time.

## 2016-06-21 NOTE — Progress Notes (Signed)
[facility] at [place] NAME: Jenna [name]    MR#:  [id number]  DATE OF BIRTH:  1935/07/15  SUBJECTIVE:   Patients cough and wheezing are worse today  REVIEW OF SYSTEMS:    Review of Systems  Constitutional: Negative for fever, chills weight loss HENT: Negative for ear pain, nosebleeds, congestion, facial swelling, rhinorrhea, neck pain, neck stiffness and ear discharge.   Respiratory: ++for cough, shortness of breath, wheezing  Cardiovascular: Negative for chest pain, palpitations and leg swelling.  Gastrointestinal: Negative for heartburn, abdominal pain, vomiting, diarrhea or consitpation Genitourinary: Negative for dysuria, urgency, frequency, hematuria Musculoskeletal: Negative for back pain or joint pain Neurological: Negative for dizziness, seizures, syncope, focal weakness,  numbness and headaches.  Hematological: Does not bruise/bleed easily.  Psychiatric/Behavioral: Negative for hallucinations, confusion, dysphoric mood    Tolerating Diet: yes      DRUG ALLERGIES:   Allergies  Allergen Reactions  . Azithromycin Other (See Comments)    Causes stomach burning pains  . Codeine Other (See Comments)    "will not stay down"  . Hydromorphone Other (See Comments)    confusion, personality change  . Morphine Other (See Comments)    Goes crazy  . Nsaids Other (See Comments)    Avoids because of gastric bypass surgery  . Oxycodone Nausea And Vomiting  . Oxycodone-Acetaminophen Nausea And Vomiting  . Nabumetone Rash  . Promethazine Hcl Rash and Other (See Comments)    VITALS:  Blood pressure (!) 140/51, pulse 75, temperature 97.5 F (36.4 C), temperature source Oral, resp. rate 20, height 5\' 2"  (1.575 m), weight 98.6 kg (217 lb 4.8 oz), SpO2 97 %.  PHYSICAL EXAMINATION:  Constitutional: Appears well-developed and well-nourished. No distress. HENT: Normocephalic. Marland Kitchen Oropharynx is clear and moist.  Eyes: Conjunctivae and EOM  are normal. PERRLA, no scleral icterus.  Neck: Normal ROM. Neck supple. No JVD. No tracheal deviation. CVS: RRR, S1/S2 +, no murmurs, no gallops, no carotid bruit.  Pulmonary:b/l wheezing , diminished breath sounds bilaterally Abdominal: Soft. BS +,  no distension, tenderness, rebound or guarding.  Musculoskeletal: Normal range of motion. No edema and no tenderness.  Neuro: Alert. CN 2-12 grossly intact. No focal deficits. Skin: Skin is warm and dry. No rash noted. Psychiatric: Normal mood and affect.      LABORATORY PANEL:   CBC  Recent Labs Lab 06/17/16 0326  WBC 7.3  HGB 12.4  HCT 35.5  PLT 180   ------------------------------------------------------------------------------------------------------------------  Chemistries   Recent Labs Lab 06/16/16 1921  06/18/16 0500  NA 141  < > 138  K 3.0*  < > 4.0  CL 106  < > 105  CO2 26  < > 24  GLUCOSE 150*  < > 257*  BUN 14  < > 29*  CREATININE 0.73  < > 0.97  CALCIUM 8.6*  < > 8.9  MG 1.4*  --   --   < > = values in this interval not displayed. ------------------------------------------------------------------------------------------------------------------  Cardiac Enzymes  Recent Labs Lab 06/16/16 1921  TROPONINI <0.03   ------------------------------------------------------------------------------------------------------------------  RADIOLOGY:  No results found.   ASSESSMENT AND PLAN:   81 year old female with past medical history significant for COPD, asthma not on home oxygen, diabetes, GERD, hypertension and obstructive sleep apnea status post bypass surgery presents to emergency room secondary to acute on chronic COPD exacerbation.   1. Acute hypoxic respiratory failure in the setting of acute on chronic COPD exacerbation with acute bronchitis Clinically worse Respiratory  panel positive Rhino virus Supportive treatment with nebulizer treatments, IV fluids, antitussives. We will try racemic epi  nebs as tolerated by patient Repeat chest x-ray Pulmonology consult Continue IV steroids  2. Acute on chronic COPD exacerbation with acute bronchitis (Streptococcus oralis on sputum cx): Patient reports improved symptoms, but not quite ready for discharge Continue IV steroids at 40 mg IV every 8 hours  D/c clindamycin  Continue inhalers D/w Dr Ola Spurr has recommended levofloxacin for 5-7 days , appreciate ID recommendations  3. Diet-controlled diabetes: Hemoglobin A1c 6.1 She will need follow-up with her primary care physician. Continue ADA diet   4. Hyperlipidemia: Continue statin  PT RECS outpatient PT.I have already ordered this as a future order in the computer.  Management plans discussed with the patient and she is in agreement.  CODE STATUS: full  TOTAL TIME TAKING CARE OF THIS PATIENT: 33 minutes.     POSSIBLE 1-2 days tomorrow, DEPENDING ON CLINICAL CONDITION.   Nicholes Mango M.D on 06/21/2016 at 10:04 AM  Between 7am to 6pm - Pager - [phone] After 6pm go to [email or web address] - password [email or web address] [facility]  Office  [phone]  CC: Primary care physician; Sofie Hartigan, MD  Note: This dictation was prepared with Dragon dictation along with smaller phrase technology. Any transcriptional errors that result from this process are unintentional.

## 2016-06-21 NOTE — Progress Notes (Signed)
[place] INFECTIOUS DISEASE PROGRESS NOTE Date of Admission:  4/35/6861     ID: Jenna [name] is a 81 y.o. female with rhinovirus and COPD exacerbation  Active Problems:   COPD exacerbation ([place])   Subjective: Very uncomfortable with coughing. No fevers, no sputum.   ROS  Eleven systems are reviewed and negative except per hpi  Medications:  Antibiotics Given (last 72 hours)    Date/Time Action Medication Dose   06/18/16 2130 Given   cefUROXime (CEFTIN) tablet 500 mg 500 mg   06/19/16 1017 Given   cefUROXime (CEFTIN) tablet 500 mg 500 mg   06/19/16 1402 Given   clindamycin (CLEOCIN) capsule 300 mg 300 mg   06/20/16 1127 Given   levofloxacin (LEVAQUIN) tablet 500 mg 500 mg   06/21/16 1114 Given   levofloxacin (LEVAQUIN) tablet 500 mg 500 mg     . chlorpheniramine-HYDROcodone  5 mL Oral Q12H  . cholecalciferol  1,000 Units Oral Daily  . citalopram  40 mg Oral Daily  . enoxaparin (LOVENOX) injection  40 mg Subcutaneous Q24H  . famotidine  20 mg Oral BID  . ferrous sulfate  325 mg Oral QHS  . furosemide  20 mg Oral Daily  . guaiFENesin-dextromethorphan  10 mL Oral Q6H  . insulin aspart  0-15 Units Subcutaneous TID WC  . insulin aspart  0-5 Units Subcutaneous QHS  . insulin aspart  3 Units Subcutaneous TID WC  . insulin glargine  10 Units Subcutaneous Daily  . ipratropium-albuterol  3 mL Nebulization Q4H  . levofloxacin  500 mg Oral Daily  . mouth rinse  15 mL Mouth Rinse BID  . methylPREDNISolone (SOLU-MEDROL) injection  40 mg Intravenous Q8H  . multivitamin with minerals  2 tablet Oral BH-q7a  . simvastatin  40 mg Oral Daily  . sodium chloride flush  3 mL Intravenous Q12H  . vitamin B-12  1,000 mcg Oral Daily  . vitamin C  500 mg Oral Daily    Objective: Vital signs in last 24 hours: Temp:  [97.5 F (36.4 C)-97.9 F (36.6 C)] 97.9 F (36.6 C) (06/01 1150) Pulse Rate:  [75-82] 82 (06/01 1150) Resp:  [19-20] 19 (06/01 1150) BP: (140-162)/(47-79) 145/66  (06/01 1150) SpO2:  [96 %-98 %] 98 % (06/01 1150) Constitutional:  oriented to person, place, and time. Obese, hoarse,  HENT: [place]/AT, PERRLA, no scleral icterus Mouth/Throat: Oropharynx is clear and moist. No oropharyngeal exudate.  Cardiovascular: Normal rate, regular rhythm and normal heart sounds.  Pulmonary/Chest: labored respiration, mod exp wheeze   Neck = supple, no nuchal rigidity Abdominal: Soft. Bowel sounds are normal.  exhibits no distension. There is no tenderness.  Lymphadenopathy: no cervical adenopathy. No axillary adenopathy Neurological: alert and oriented to person, place, and time.  Skin: Skin is warm and dry. No rash noted. No erythema.  Psychiatric: a normal mood and affect.  behavior is normal.  Lab Results No results for input(s): WBC, HGB, HCT, NA, K, CL, CO2, BUN, CREATININE, GLU in the last 72 hours.  Invalid input(s): PLATELETS  Microbiology: Results for orders placed or performed during the hospital encounter of 06/16/16  Culture, sputum-assessment     Status: None   Collection Time: 06/17/16  8:26 AM  Result Value Ref Range Status   Specimen Description SPUTUM  Final   Special Requests Immunocompromised  Final   Sputum evaluation THIS SPECIMEN IS ACCEPTABLE FOR SPUTUM CULTURE  Final   Report Status 06/17/2016 FINAL  Final  Culture, respiratory (NON-Expectorated)  Status: None   Collection Time: 06/17/16  8:26 AM  Result Value Ref Range Status   Specimen Description SPUTUM  Final   Special Requests Immunocompromised Reflexed from [id number]  Final   Gram Stain   Final    FEW WBC PRESENT, PREDOMINANTLY MONONUCLEAR FEW GRAM POSITIVE COCCI IN PAIRS FEW GRAM POSITIVE RODS    Culture MODERATE STREPTOCOCCUS MITIS/ORALIS  Final   Report Status 06/19/2016 FINAL  Final   Organism ID, Bacteria STREPTOCOCCUS MITIS/ORALIS  Final      Susceptibility   Streptococcus mitis/oralis - MIC*    ERYTHROMYCIN 2 RESISTANT Resistant     TETRACYCLINE >=16 RESISTANT  Resistant     VANCOMYCIN 0.5 SENSITIVE Sensitive     CLINDAMYCIN <=0.25 SENSITIVE Sensitive     PENICILLIN 0.25 INTERMEDIATE Intermediate     CEFTRIAXONE 0.5 SENSITIVE Sensitive     LEVOFLOXACIN Value in next row Sensitive      1 SENSITIVEPerformed at [facility], [address] [place], [place] [address]    * MODERATE STREPTOCOCCUS MITIS/ORALIS  Respiratory Panel by PCR     Status: Abnormal   Collection Time: 06/19/16  8:20 PM  Result Value Ref Range Status   Adenovirus NOT DETECTED NOT DETECTED Final   Coronavirus 229E NOT DETECTED NOT DETECTED Final   Coronavirus HKU1 NOT DETECTED NOT DETECTED Final   Coronavirus NL63 NOT DETECTED NOT DETECTED Final   Coronavirus OC43 NOT DETECTED NOT DETECTED Final   Metapneumovirus NOT DETECTED NOT DETECTED Final   Rhinovirus / Enterovirus DETECTED (A) NOT DETECTED Final   Influenza A NOT DETECTED NOT DETECTED Final   Influenza B NOT DETECTED NOT DETECTED Final   Parainfluenza Virus 1 NOT DETECTED NOT DETECTED Final   Parainfluenza Virus 2 NOT DETECTED NOT DETECTED Final   Parainfluenza Virus 3 NOT DETECTED NOT DETECTED Final   Parainfluenza Virus 4 NOT DETECTED NOT DETECTED Final   Respiratory Syncytial Virus NOT DETECTED NOT DETECTED Final   Bordetella pertussis NOT DETECTED NOT DETECTED Final   Chlamydophila pneumoniae NOT DETECTED NOT DETECTED Final   Mycoplasma pneumoniae NOT DETECTED NOT DETECTED Final    Comment: Performed at [facility], [place] [address], [place], [place] [address]    Studies/Results: Dg Chest 2 View  Result Date: 06/21/2016 CLINICAL DATA:  Coughing and wheezing. EXAM: CHEST  2 VIEW COMPARISON:  CT 06/17/2016.  Chest x-ray 06/16/2016. FINDINGS: Mediastinum hilar structures are normal. Prominent epicardial fat pad on the left. Heart size stable. No focal infiltrate. No pleural effusion or pneumothorax. IMPRESSION: No acute cardiopulmonary disease identified. Electronically Signed   By: Blount   On:  06/21/2016 10:44    Assessment/Plan: Jenna [name] is a 81 y.o. female with COPD, asthma admitted with sob and cough. CT and CXR negative for PNA but cx with strep mitis/oralis. Resp PCR + rhinovirus.  I think she mainly has viral induced asthma exacerbation.  She still has impressive wheeze but no fevers and wbc was normal on admit. FU CXR negative.  She has a hx of severe asthma followed by Dr Raul Del and has had ICU admissions in the past so will need close monitoring. Recommendations Can dc levofloxacin as cxr neg and have a viral etiology.  The Strep oralis/mitis is oral flora isolated from her sputum so unlikely an active bacterial infection and no evidence clinically of aspiration. Continue treatment of COPD  Cont tussionex and I have added tessalon perles for cough. Thank you very much for the consult. Will  follow with you.  [employer], Henny Strauch P   06/21/2016, 2:31 PM

## 2016-06-21 NOTE — Care Management Important Message (Signed)
Important Message  Patient Details  Name: Jenna [name] MRN: [id number] Date of Birth: 1935-09-24   Medicare Important Message Given:  Yes    Beverly Sessions, RN 06/21/2016, 11:19 AM

## 2016-06-21 NOTE — Progress Notes (Signed)
Per Dr. Margaretmary Eddy okay to hold meal coverage and sliding scale for blood glucose of 134. Pt coughing so much she is not able to eat and took lantus this morning.

## 2016-06-21 NOTE — Progress Notes (Signed)
PT Cancellation Note  Patient Details Name: Jenna [name] MRN: [id number] DOB: [birth year]   Cancelled Treatment:    Reason Eval/Treat Not Completed: Other (comment). Treatment attempted x 2 this AM. First attempt, pt receiving breathing treatment, and on 2nd attempt, pt with increased wheezing. Per RN, may be better for pt to ambulate in PM secondary to respiratory status. Will re-attempt, if time available.    Jaymond Waage 06/21/2016, 11:29 AM  Greggory Stallion, PT, DPT [phone]

## 2016-06-22 LAB — GLUCOSE, CAPILLARY
Glucose-Capillary: 212 mg/dL — ABNORMAL HIGH (ref 65–99)
Glucose-Capillary: 168 mg/dL — ABNORMAL HIGH (ref 65–99)
Glucose-Capillary: 104 mg/dL — ABNORMAL HIGH (ref 65–99)
Glucose-Capillary: 232 mg/dL — ABNORMAL HIGH (ref 65–99)

## 2016-06-22 LAB — BASIC METABOLIC PANEL
Anion gap: 10 (ref 5–15)
BUN: 36 mg/dL — ABNORMAL HIGH (ref 6–20)
CO2: 26 mmol/L (ref 22–32)
Calcium: 8.6 mg/dL — ABNORMAL LOW (ref 8.9–10.3)
Chloride: 102 mmol/L (ref 101–111)
Creatinine, Ser: 1.07 mg/dL — ABNORMAL HIGH (ref 0.44–1.00)
GFR calc Af Amer: 56 mL/min — ABNORMAL LOW (ref >60)
GFR calc non Af Amer: 48 mL/min — ABNORMAL LOW (ref >60)
Glucose, Bld: 189 mg/dL — ABNORMAL HIGH (ref 65–99)
Potassium: 4.9 mmol/L (ref 3.5–5.1)
Sodium: 138 mmol/L (ref 135–145)

## 2016-06-22 LAB — CBC
HCT: 38.9 % (ref 35.0–47.0)
Hemoglobin: 13.1 g/dL (ref 12.0–16.0)
MCH: 32.3 pg (ref 26.0–34.0)
MCHC: 33.8 g/dL (ref 32.0–36.0)
MCV: 95.6 fL (ref 80.0–100.0)
Platelets: 247 10*3/uL (ref 150–440)
RBC: 4.06 MIL/uL (ref 3.80–5.20)
RDW: 12.7 % (ref 11.5–14.5)
WBC: 10.8 10*3/uL (ref 3.6–11.0)

## 2016-06-22 MED ORDER — MENTHOL 5.8 MG MT LOZG
1.0000 | LOZENGE | OROMUCOSAL | Status: DC | PRN
  Administered 2016-06-23 (×4): 5.8 mg via OROMUCOSAL
  Filled 2016-06-22 (×6): qty 1

## 2016-06-22 MED ORDER — MENTHOL 3 MG MT LOZG: 1.0000 | LOZENGE | OROMUCOSAL | Status: DC | PRN

## 2016-06-22 MED ORDER — METHYLPREDNISOLONE SODIUM SUCC 40 MG IJ SOLR: 40.0000 mg | INTRAMUSCULAR | Status: DC

## 2016-06-22 NOTE — Progress Notes (Signed)
Per Dr. Margaretmary Eddy okay to hold meal coverage as blood glucose was 104 and IV steroid frequency was reduced. Pt got down to 80 last night.

## 2016-06-22 NOTE — Progress Notes (Signed)
[place] [facility] [place] NAME: Taisha Pennebaker    MR#:  [id number]  DATE OF BIRTH:  May 25, 1935  SUBJECTIVE:   Patients cough and wheezing are better today  REVIEW OF SYSTEMS:    Review of Systems  Constitutional: Negative for fever, chills weight loss HENT: Negative for ear pain, nosebleeds, congestion, facial swelling, rhinorrhea, neck pain, neck stiffness and ear discharge.   Respiratory: +for cough, shortness of breath, wheezing  Cardiovascular: Negative for chest pain, palpitations and leg swelling.  Gastrointestinal: Negative for heartburn, abdominal pain, vomiting, diarrhea or consitpation Genitourinary: Negative for dysuria, urgency, frequency, hematuria Musculoskeletal: Negative for back pain or joint pain Neurological: Negative for dizziness, seizures, syncope, focal weakness,  numbness and headaches.  Hematological: Does not bruise/bleed easily.  Psychiatric/Behavioral: Negative for hallucinations, confusion, dysphoric mood    Tolerating Diet: yes      DRUG ALLERGIES:   Allergies  Allergen Reactions  . Azithromycin Other (See Comments)    Causes stomach burning pains  . Codeine Other (See Comments)    "will not stay down"  . Hydromorphone Other (See Comments)    confusion, personality change  . Morphine Other (See Comments)    Goes crazy  . Nsaids Other (See Comments)    Avoids because of gastric bypass surgery  . Oxycodone Nausea And Vomiting  . Oxycodone-Acetaminophen Nausea And Vomiting  . Nabumetone Rash  . Promethazine Hcl Rash and Other (See Comments)    VITALS:  Blood pressure 140/70, pulse 86, temperature 98 F (36.7 C), temperature source Oral, resp. rate 20, height 5\' 2"  (1.575 m), weight 98.6 kg (217 lb 4.8 oz), SpO2 92 %.  PHYSICAL EXAMINATION:  Constitutional: Appears well-developed and well-nourished. No distress. HENT: Normocephalic. Marland Kitchen Oropharynx is clear and moist.  Eyes: Conjunctivae and EOM are  normal. PERRLA, no scleral icterus.  Neck: Normal ROM. Neck supple. No JVD. No tracheal deviation. CVS: RRR, S1/S2 +, no murmurs, no gallops, no carotid bruit.  Pulmonary:b/l wheezing , diminished breath sounds bilaterally Abdominal: Soft. BS +,  no distension, tenderness, rebound or guarding.  Musculoskeletal: Normal range of motion. No edema and no tenderness.  Neuro: Alert. CN 2-12 grossly intact. No focal deficits. Skin: Skin is warm and dry. No rash noted. Psychiatric: Normal mood and affect.      LABORATORY PANEL:   CBC  Recent Labs Lab 06/22/16 0327  WBC 10.8  HGB 13.1  HCT 38.9  PLT 247   ------------------------------------------------------------------------------------------------------------------  Chemistries   Recent Labs Lab 06/16/16 1921  06/22/16 0327  NA 141  < > 138  K 3.0*  < > 4.9  CL 106  < > 102  CO2 26  < > 26  GLUCOSE 150*  < > 189*  BUN 14  < > 36*  CREATININE 0.73  < > 1.07*  CALCIUM 8.6*  < > 8.6*  MG 1.4*  --   --   < > = values in this interval not displayed. ------------------------------------------------------------------------------------------------------------------  Cardiac Enzymes  Recent Labs Lab 06/16/16 1921  TROPONINI <0.03   ------------------------------------------------------------------------------------------------------------------  RADIOLOGY:  Dg Chest 2 View  Result Date: 06/21/2016 CLINICAL DATA:  Coughing and wheezing. EXAM: CHEST  2 VIEW COMPARISON:  CT 06/17/2016.  Chest x-ray 06/16/2016. FINDINGS: Mediastinum hilar structures are normal. Prominent epicardial fat pad on the left. Heart size stable. No focal infiltrate. No pleural effusion or pneumothorax. IMPRESSION: No acute cardiopulmonary disease identified. Electronically Signed   By: Marcello Moores  Register   On: 06/21/2016  10:44     ASSESSMENT AND PLAN:   81 year old female with past medical history significant for COPD, asthma not on home oxygen,  diabetes, GERD, hypertension and obstructive sleep apnea status post bypass surgery presents to emergency room secondary to acute on chronic COPD exacerbation.   1. Acute hypoxic respiratory failure in the setting of acute on chronic COPD exacerbation with acute bronchitis Clinically better today Respiratory panel positive Rhino virus Supportive treatment with nebulizer treatments, IV fluids, antitussives. We will try racemic epi nebs as tolerated by patient Repeat chest x-ray- no acute findings Pulmonology consult Wean off IV steroids Incentive spirometry and ambulate in the hallway  2. Acute on chronic COPD exacerbation with acute bronchitis (Streptococcus oralis on sputum cx): Patient reports improved symptoms, but not quite ready for discharge Taper IV steroids D/c clindamycin  Continue inhalers D/w Dr Ola Spurr has recommended to discontinue levofloxacin as chest x-ray negative and have a viral etiology,Strep oralis/mitis is oral flora isolated from her sputum so unlikely an active bacterial infection and no evidence clinically of aspiration. appreciate ID recommendations  3. Diet-controlled diabetes: Hemoglobin A1c 6.1 She will need follow-up with her primary care physician. Continue ADA diet   4. Hyperlipidemia: Continue statin  PT RECS outpatient PT.I have already ordered this as a future order in the computer.  Management plans discussed with the patient and she is in agreement.  CODE STATUS: full  TOTAL TIME TAKING CARE OF THIS PATIENT: 33 minutes.     POSSIBLE 1-day  DEPENDING ON CLINICAL CONDITION.   Nicholes Mango M.D on 06/22/2016 at 3:06 PM  Between 7am to 6pm - Pager - [phone] After 6pm go to [email or web address] - password [email or web address] [facility]  Office  [phone]  CC: Primary care physician; Sofie Hartigan, MD  Note: This dictation was prepared with Dragon dictation along with smaller phrase technology. Any transcriptional  errors that result from this process are unintentional.

## 2016-06-22 NOTE — Plan of Care (Signed)
Problem: Activity: Goal: Risk for activity intolerance will decrease Outcome: Not Progressing Pt continues to have episodes of wheezing that affect her activity level  Problem: Nutrition: Goal: Adequate nutrition will be maintained Outcome: Not Progressing Episodes of wheezing affect her eating

## 2016-06-22 NOTE — Progress Notes (Signed)
Physical Therapy Treatment Patient Details Name: Jenna [name] MRN: [id number] DOB: 1935/03/04 Today's Date: 06/22/2016    History of Present Illness Pt admitted for COPD exacerbation. Pt with complaints of SOB symptoms. Pt with history of asthma, COPD, DM and depression. Pt reports recent falls    PT Comments    To bathroom and ambulated 150' in hallway with labored but steady gait.  Pt with frequent coughing and generally SOB with gait but no overt LOB or buckling.  Does not use AD but may benefit from device especially for community ambulation but pt seems resistant to it at this time.     Follow Up Recommendations  Outpatient PT     Equipment Recommendations  None recommended by PT    Recommendations for Other Services       Precautions / Restrictions Precautions Precautions: Fall Restrictions Weight Bearing Restrictions: No    Mobility  Bed Mobility               General bed mobility comments: Received and left upright in recliner  Transfers Overall transfer level: Modified independent Equipment used: None Transfers: Sit to/from Stand Sit to Stand: Supervision            Ambulation/Gait     Assistive device: None Gait Pattern/deviations: Step-to pattern   Gait velocity interpretation: <1.8 ft/sec, indicative of risk for recurrent falls General Gait Details: dec step height and length. frequent standing rest breaks and reaching for rails in hallway for stability but no bucking of LOB "I just walk like an old woman"   Financial trader [employer] (Stroke Patients Only)       Balance Overall balance assessment: Needs assistance Sitting-balance support: Feet supported Sitting balance-Leahy Scale: Normal     Standing balance support: No upper extremity supported Standing balance-Leahy Scale: Good                              Cognition Arousal/Alertness: Awake/alert Behavior During Therapy:  WFL for tasks assessed/performed Overall Cognitive Status: Within Functional Limits for tasks assessed                                        Exercises      General Comments        Pertinent Vitals/Pain Pain Assessment: No/denies pain    Home Living                      Prior Function            PT Goals (current goals can now be found in the care plan section) Progress towards PT goals: Progressing toward goals    Frequency    Min 2X/week      PT Plan Current plan remains appropriate    Co-evaluation              AM-PAC PT "6 Clicks" Daily Activity  Outcome Measure  Difficulty turning over in bed (including adjusting bedclothes, sheets and blankets)?: None Difficulty moving from lying on back to sitting on the side of the bed? : None Difficulty sitting down on and standing up from a chair with arms (e.g., wheelchair, bedside commode, etc,.)?: None Help needed moving to and from a bed to chair (including a  wheelchair)?: A Little Help needed walking in hospital room?: A Little Help needed climbing 3-5 steps with a railing? : A Little 6 Click Score: 21    End of Session     Patient left: in chair;with family/visitor present;with call bell/phone within reach         Time: 4720-7218 PT Time Calculation (min) (ACUTE ONLY): 12 min  Charges:  $Gait Training: 8-22 mins                    G Codes:       Chesley Noon, PTA 06/22/16, 11:31 AM

## 2016-06-22 NOTE — Consult Note (Signed)
Pulmonary Critical Care  Initial Consult Note   Rayyan Orsborn Hinde ZOX:[id number] DOB: 01-20-1936 DOA: 06/16/2016   Referring physician: Darlen Round, MD PCP: Sofie Hartigan, MD   Chief Complaint: Shortness of breath  HPI: Rashon Rezek is a 81 y.o. female with history of COPD followed by Dr Raul Del presented to the hospital with increased SOB with exertion and rest. She had noted a cough and also had some sputum production noted. Patient denies now having fevers. She has no chest pain noted. She had been having wheeze. Patient was given solumedrol and dounebs in the ED and did not have much improvement so was admitted. Patient has been seen by ID and noted to have acute bronchitis   Review of Systems:  12 point ROS performed and is unremarkable other than noted in HPI  Past Medical History:  Diagnosis Date  . Arthritis    neck, knees(before replacements)  . Asthma   . COPD (chronic obstructive pulmonary disease) ([place])   . Depression   . Encounter for colonoscopy due to history of adenomatous colonic polyps   . GERD (gastroesophageal reflux disease)   . Hearing loss of both ears   . Hyperlipidemia   . Hypertension   . Palpitations   . Sleep apnea    resolved with gastric bypass  . Syncope and collapse    Past Surgical History:  Procedure Laterality Date  . BROW LIFT Bilateral 06/27/2015   Procedure: BLEPHAROPLASTY BILATERAL UPPER EYELIDS BILATERAL BLEPHAROTOSIS EYELIDS;  Surgeon: Karle Starch, MD;  Location: [facility];  Service: Ophthalmology;  Laterality: Bilateral;  BILATERAL  . CATARACT EXTRACTION W/PHACO Right 02/13/2015   Procedure: CATARACT EXTRACTION PHACO AND INTRAOCULAR LENS PLACEMENT (IOC);  Surgeon: Ronnell Freshwater, MD;  Location: [place] [facility];  Service: Ophthalmology;  Laterality: Right;  . CATARACT EXTRACTION W/PHACO Left 03/22/2015   Procedure: CATARACT EXTRACTION PHACO AND INTRAOCULAR LENS PLACEMENT (IOC);  Surgeon: Ronnell Freshwater, MD;  Location: [facility];  Service: Ophthalmology;  Laterality: Left;  TORIC  . CHOLECYSTECTOMY  1984  . COLECTOMY    . COSMETIC SURGERY  2012   tummy tuck and excess skin removal  . GASTRIC BYPASS  2010  . REPLACEMENT TOTAL KNEE BILATERAL  1998  . SHOULDER SURGERY  2009   left   . TONSILLECTOMY    . TOTAL ABDOMINAL HYSTERECTOMY  1979   Social History:  reports that she has quit smoking. Her smoking use included Cigarettes. She has a 3.75 pack-year smoking history. She has never used smokeless tobacco. She reports that she does not drink alcohol or use drugs.  Allergies  Allergen Reactions  . Azithromycin Other (See Comments)    Causes stomach burning pains  . Codeine Other (See Comments)    "will not stay down"  . Hydromorphone Other (See Comments)    confusion, personality change  . Morphine Other (See Comments)    Goes crazy  . Nsaids Other (See Comments)    Avoids because of gastric bypass surgery  . Oxycodone Nausea And Vomiting  . Oxycodone-Acetaminophen Nausea And Vomiting  . Nabumetone Rash  . Promethazine Hcl Rash and Other (See Comments)    Family History  Problem Relation Age of Onset  . Heart attack Mother   . Hypertension Mother   . Heart attack Brother   . Heart attack Sister 28  . Hyperlipidemia Sister   . Hypertension Sister     Prior to Admission medications   Medication Sig Start Date End  Date Taking? Authorizing Provider  albuterol (PROVENTIL HFA;VENTOLIN HFA) 108 (90 BASE) MCG/ACT inhaler Inhale 2 puffs into the lungs every 6 (six) hours as needed for wheezing or shortness of breath. 06/14/14  Yes Demetrios Loll, MD  Ascorbic Acid (VITAMIN C) 1000 MG tablet Take 500 mg by mouth daily.    Yes [provider]  benzonatate (TESSALON) 100 MG capsule Take 100-200 mg by mouth 3 (three) times daily as needed for cough.   Yes [provider]  cefdinir (OMNICEF) 300 MG capsule Take 300 mg by mouth 2 (two) times daily.   Yes  [provider]  Cholecalciferol (VITAMIN D-1000 MAX ST) 1000 UNITS tablet Take 1,000 Units by mouth daily.    Yes [provider]  citalopram (CELEXA) 40 MG tablet Take 40 mg by mouth daily.    Yes [provider]  Cyanocobalamin (RA VITAMIN B-12 TR) 1000 MCG TBCR Take 1,000 mcg by mouth daily.    Yes [provider]  ferrous sulfate 325 (65 FE) MG EC tablet Take 325 mg by mouth at bedtime.    Yes [provider]  furosemide (LASIX) 20 MG tablet Take 20 mg by mouth daily.  09/28/13  Yes [provider]  ipratropium (ATROVENT) 0.02 % nebulizer solution Take 0.5 mg by nebulization 4 (four) times daily.   Yes [provider]  Multiple Vitamins-Minerals (ONE-A-DAY WOMENS PETITES) TABS Take 2 tablets by mouth every morning.   Yes [provider]  Omega-3 Fatty Acids (FISH OIL) 1000 MG CAPS Take 1,000 mg by mouth daily.   Yes [provider]  simvastatin (ZOCOR) 40 MG tablet Take 40 mg by mouth daily.    Yes [provider]  predniSONE (DELTASONE) 10 MG tablet Take 1 tablet (10 mg total) by mouth daily with breakfast. 60 mg PO (ORAL) x 2 days 50 mg PO (ORAL)  x 2 days 40 mg PO (ORAL)  x 2 days 30 mg PO  (ORAL)  x 2 days 20 mg PO  (ORAL) x 2 days 10 mg PO  (ORAL) x 2 days then stop 06/19/16   Bettey Costa, MD  ranitidine (ZANTAC) 150 MG tablet Take 1 tablet (150 mg total) by mouth 2 (two) times daily. 06/19/16 06/19/17  Bettey Costa, MD   Physical Exam: Vitals:   06/22/16 0007 06/22/16 0636 06/22/16 0953 06/22/16 1300  BP:  (!) 175/72 138/68 140/70  Pulse:  90 82 86  Resp:  20    Temp:  98 F (36.7 C)  98 F (36.7 C)  TempSrc:  Oral  Oral  SpO2: 91% 95%  92%  Weight:      Height:        Wt Readings from Last 3 Encounters:  06/16/16 98.6 kg (217 lb 4.8 oz)  09/22/15 93 kg (205 lb)  09/20/15 93 kg (205 lb)    General:  Appears calm and comfortable Eyes: PERRL, normal lids, irises & conjunctiva ENT:  grossly normal hearing, lips & tongue Neck: no LAD, masses or thyromegaly Cardiovascular: RRR, no m/r/g. No LE edema. Respiratory: scattered rhonchi. Normal respiratory effort. Abdomen: soft, nontender Skin: no rash or induration seen on limited exam Musculoskeletal: grossly normal tone BUE/BLE Psychiatric: grossly normal mood and affect Neurologic: grossly non-focal.          Labs on Admission:  Basic Metabolic Panel:  Recent Labs Lab 06/16/16 1921 06/17/16 0326 06/18/16 0500 06/22/16 0327  NA 141 140 138 138  K 3.0* 3.7 4.0 4.9  CL  106 104 105 102  CO2 26 23 24 26   GLUCOSE 150* 354* 257* 189*  BUN 14 15 29* 36*  CREATININE 0.73 0.95 0.97 1.07*  CALCIUM 8.6* 8.5* 8.9 8.6*  MG 1.4*  --   --   --   PHOS 2.8  --   --   --    Liver Function Tests: No results for input(s): AST, ALT, ALKPHOS, BILITOT, PROT, ALBUMIN in the last 168 hours. No results for input(s): LIPASE, AMYLASE in the last 168 hours. No results for input(s): AMMONIA in the last 168 hours. CBC:  Recent Labs Lab 06/16/16 1921 06/17/16 0326 06/22/16 0327  WBC 7.0 7.3 10.8  HGB 12.7 12.4 13.1  HCT 36.7 35.5 38.9  MCV 96.1 96.9 95.6  PLT 188 180 247   Cardiac Enzymes:  Recent Labs Lab 06/16/16 1921  TROPONINI <0.03    BNP (last 3 results) No results for input(s): BNP in the last 8760 hours.  ProBNP (last 3 results) No results for input(s): PROBNP in the last 8760 hours.  CBG:  Recent Labs Lab 06/21/16 1129 06/21/16 1637 06/21/16 2129 06/22/16 0741 06/22/16 1152  GLUCAP 134* 218* 80 212* 168*    Radiological Exams on Admission: Dg Chest 2 View  Result Date: 06/21/2016 CLINICAL DATA:  Coughing and wheezing. EXAM: CHEST  2 VIEW COMPARISON:  CT 06/17/2016.  Chest x-ray 06/16/2016. FINDINGS: Mediastinum hilar structures are normal. Prominent epicardial fat pad on the left. Heart size stable. No focal infiltrate. No pleural effusion or pneumothorax. IMPRESSION: No acute cardiopulmonary  disease identified. Electronically Signed   By: Marcello Moores  Register   On: 06/21/2016 10:44    EKG: Independently reviewed.  Assessment/Plan Active Problems:   COPD exacerbation ([place])   1. Acute respiratory failure with hypoxia -likely related to underlying COPD exacerbation -patient now doing a little better -likely with underlying bronchitis exacerbating the COPD  2. COPD -continue with inhalers and nebs -she will follow up with Dr Raul Del in office  Code Status: full code  Family Communication: none Disposition Plan: home  Time spent: 16min    I have personally obtained a history, examined the patient, evaluated laboratory and imaging results, formulated the assessment and plan and placed orders.  The Patient requires high complexity decision making for assessment and support.    Allyne Gee, MD [facility] Pulmonary Critical Care Medicine Sleep Medicine

## 2016-06-23 LAB — CREATININE, SERUM
Creatinine, Ser: 1.26 mg/dL — ABNORMAL HIGH (ref 0.44–1.00)
GFR calc Af Amer: 46 mL/min — ABNORMAL LOW (ref >60)
GFR calc non Af Amer: 39 mL/min — ABNORMAL LOW (ref >60)

## 2016-06-23 LAB — GLUCOSE, CAPILLARY: Glucose-Capillary: 160 mg/dL — ABNORMAL HIGH (ref 65–99)

## 2016-06-23 MED ORDER — PREDNISONE 10 MG (21) PO TBPK: ORAL_TABLET | Freq: Every day | ORAL | 0 refills | Status: DC

## 2016-06-23 MED ORDER — SODIUM CHLORIDE 0.9 % IV BOLUS (SEPSIS)
500.0000 mL | Freq: Once | INTRAVENOUS | Status: AC
  Administered 2016-06-23: 500 mL via INTRAVENOUS

## 2016-06-23 NOTE — Progress Notes (Signed)
Pt A and O x 4. VSS. Pt tolerating diet well. No complaints of pain or nausea. IV removed intact, prescriptions given. Pt voiced understanding of discharge instructions with no further questions. Pt discharged via wheelchair with RN and aide.

## 2016-06-23 NOTE — Discharge Instructions (Signed)
Follow-up with primary care physician in 5-7 days Follow-up with pulmonology Dr. Raul Del in a week

## 2016-06-23 NOTE — Discharge Summary (Signed)
[place] at [place] NAME: Jenna [name]    MR#:  [id number]  DATE OF BIRTH:  1935/01/24  DATE OF ADMISSION:  06/16/2016 ADMITTING PHYSICIAN: Harvie Bridge, DO  DATE OF DISCHARGE:  06/23/16 PRIMARY CARE PHYSICIAN: Sofie Hartigan, MD    ADMISSION DIAGNOSIS:  COPD exacerbation ([place]) [J44.1]  DISCHARGE DIAGNOSIS:  Active Problems:   COPD exacerbation ([place]) Acute bronchitis  SECONDARY DIAGNOSIS:   Past Medical History:  Diagnosis Date  . Arthritis    neck, knees(before replacements)  . Asthma   . COPD (chronic obstructive pulmonary disease) ([place])   . Depression   . Encounter for colonoscopy due to history of adenomatous colonic polyps   . GERD (gastroesophageal reflux disease)   . Hearing loss of both ears   . Hyperlipidemia   . Hypertension   . Palpitations   . Sleep apnea    resolved with gastric bypass  . Syncope and collapse     HOSPITAL COURSE:  HPI: Jenna [name] is a 81 y.o. female with a known history of Asthma, COPD, diabetes, depression, GERD, hypertension, hyperlipidemia, obstructive sleep apnea, status post gastric bypass surgery presents to the emergency department for evaluation of shortness of breath.  Patient was in a usual state of health until last week when she developed cough which is nonproductive associated with soreness of breath and wheezing which has been refractory to Cefdinir and inhalers at home. Per EMS O2 sat was in the 80s therefore In the ambulance she received 2 DuoNeb's, racemic epinephrine. In the emergency department she received DuoNeb, albuterol 2 and 125 mg of Solu-Medrol.  Of note patient and her husband drove home from [place] and her symptoms began that same evening. She states that she did get out of the car to walk around. She denies lower extremity swelling or pain.  Patient denies fevers/chills, weakness, dizziness, chest pain, shortness of breath, N/V/C/D, abdominal pain,  dysuria/frequency, changes in mental status.    Otherwise there has been no change in status. Patient has been taking medication as prescribed and there has been no recent change in medication or diet.  No recent antibiotics.  There has been no recent illness, hospitalizations, sick contacts.    1. Acute hypoxic respiratory failure in the setting of acute on chronic COPD exacerbation with acute bronchitis Clinically better today, will discharge home Respiratory panel positive Rhino virus Supportive treatment with nebulizer treatments, IV fluids, antitussives provider and clinically improved. Repeat chest x-ray- no acute findings Pulmonology consult placed but the patient was not seen, outpatient follow-up with Dr. Clent Ridges off IV steroids Incentive spirometry   2. Acute on chronic COPD exacerbation with acute bronchitis (Streptococcus oralis on sputum cx): Patient reports improved symptoms, but not quite ready for discharge Taper IV steroids D/c clindamycin  Continue inhalers D/w Dr Ola Spurr has recommended to discontinue levofloxacin as chest x-ray negative and have a viral etiology,Strep oralis/mitis is oral flora isolated from her sputum so unlikely an active bacterial infection and no evidence clinically of aspiration. appreciate ID recommendations  3. Diet-controlled diabetes: Hemoglobin A1c 6.1 She will need follow-up with her primary care physician. Continue ADA diet   4. Hyperlipidemia: Continue statin  PT RECS outpatient PT.I have already ordered this   DISCHARGE CONDITIONS:   stable  CONSULTS OBTAINED:  Treatment Team:  Leonel Ramsay, MD Erby Pian, MD Allyne Gee, MD   PROCEDURES none  DRUG ALLERGIES:   Allergies  Allergen Reactions  .  Azithromycin Other (See Comments)    Causes stomach burning pains  . Codeine Other (See Comments)    "will not stay down"  . Hydromorphone Other (See Comments)    confusion, personality change  .  Morphine Other (See Comments)    Goes crazy  . Nsaids Other (See Comments)    Avoids because of gastric bypass surgery  . Oxycodone Nausea And Vomiting  . Oxycodone-Acetaminophen Nausea And Vomiting  . Nabumetone Rash  . Promethazine Hcl Rash and Other (See Comments)    DISCHARGE MEDICATIONS:   Current Discharge Medication List    START taking these medications   Details  predniSONE (STERAPRED UNI-PAK 21 TAB) 10 MG (21) TBPK tablet Take by mouth daily. Take 6 tablets by mouth for 1 day followed by  5 tablets by mouth for 1 day followed by  4 tablets by mouth for 1 day followed by  3 tablets by mouth for 1 day followed by  2 tablets by mouth for 1 day followed by  1 tablet by mouth for a day and stop Qty: 21 tablet, Refills: 0      CONTINUE these medications which have CHANGED   Details  ranitidine (ZANTAC) 150 MG tablet Take 1 tablet (150 mg total) by mouth 2 (two) times daily.      CONTINUE these medications which have NOT CHANGED   Details  albuterol (PROVENTIL HFA;VENTOLIN HFA) 108 (90 BASE) MCG/ACT inhaler Inhale 2 puffs into the lungs every 6 (six) hours as needed for wheezing or shortness of breath. Qty: 1 Inhaler, Refills: 2    Ascorbic Acid (VITAMIN C) 1000 MG tablet Take 500 mg by mouth daily.     benzonatate (TESSALON) 100 MG capsule Take 100-200 mg by mouth 3 (three) times daily as needed for cough.    Cholecalciferol (VITAMIN D-1000 MAX ST) 1000 UNITS tablet Take 1,000 Units by mouth daily.     citalopram (CELEXA) 40 MG tablet Take 40 mg by mouth daily.     Cyanocobalamin (RA VITAMIN B-12 TR) 1000 MCG TBCR Take 1,000 mcg by mouth daily.     ferrous sulfate 325 (65 FE) MG EC tablet Take 325 mg by mouth at bedtime.     furosemide (LASIX) 20 MG tablet Take 20 mg by mouth daily.     ipratropium (ATROVENT) 0.02 % nebulizer solution Take 0.5 mg by nebulization 4 (four) times daily.    Multiple Vitamins-Minerals (ONE-A-DAY WOMENS PETITES) TABS Take 2 tablets by  mouth every morning.    Omega-3 Fatty Acids (FISH OIL) 1000 MG CAPS Take 1,000 mg by mouth daily.    simvastatin (ZOCOR) 40 MG tablet Take 40 mg by mouth daily.       STOP taking these medications     cefdinir (OMNICEF) 300 MG capsule          DISCHARGE INSTRUCTIONS:   Follow-up with primary care physician in 5-7 days Follow-up with pulmonology Dr. Raul Del in a week   DIET:  Diabetic diet  DISCHARGE CONDITION:  Stable  ACTIVITY:  Activity as tolerated  OXYGEN:  Home Oxygen: No.   Oxygen Delivery: room air  DISCHARGE LOCATION:  home   If you experience worsening of your admission symptoms, develop shortness of breath, life threatening emergency, suicidal or homicidal thoughts you must seek medical attention immediately by calling 911 or calling your MD immediately  if symptoms less severe.  You Must read complete instructions/literature along with all the possible adverse reactions/side effects for all the Medicines you take  and that have been prescribed to you. Take any new Medicines after you have completely understood and accpet all the possible adverse reactions/side effects.   Please note  You were cared for by a hospitalist during your hospital stay. If you have any questions about your discharge medications or the care you received while you were in the hospital after you are discharged, you can call the unit and asked to speak with the hospitalist on call if the hospitalist that took care of you is not available. Once you are discharged, your primary care physician will handle any further medical issues. Please note that NO REFILLS for any discharge medications will be authorized once you are discharged, as it is imperative that you return to your primary care physician (or establish a relationship with a primary care physician if you do not have one) for your aftercare needs so that they can reassess your need for medications and monitor your lab  values.     Today  Chief Complaint  Patient presents with  . Shortness of Breath   Patient is feeling much better, cough improved shortness of breath improved wants to go home  ROS:  CONSTITUTIONAL: Denies fevers, chills. Denies any fatigue, weakness.  EYES: Denies blurry vision, double vision, eye pain. EARS, NOSE, THROAT: Denies tinnitus, ear pain, hearing loss. RESPIRATORY: Improved cough, wheeze, shortness of breath.  CARDIOVASCULAR: Denies chest pain, palpitations, edema.  GASTROINTESTINAL: Denies nausea, vomiting, diarrhea, abdominal pain. Denies bright red blood per rectum. GENITOURINARY: Denies dysuria, hematuria. ENDOCRINE: Denies nocturia or thyroid problems. HEMATOLOGIC AND LYMPHATIC: Denies easy bruising or bleeding. SKIN: Denies rash or lesion. MUSCULOSKELETAL: Denies pain in neck, back, shoulder, knees, hips or arthritic symptoms.  NEUROLOGIC: Denies paralysis, paresthesias.  PSYCHIATRIC: Denies anxiety or depressive symptoms.   VITAL SIGNS:  Blood pressure (!) 151/63, pulse 81, temperature 98 F (36.7 C), temperature source Oral, resp. rate 18, height 5\' 2"  (1.575 m), weight 98.6 kg (217 lb 4.8 oz), SpO2 95 %.  I/O:    Intake/Output Summary (Last 24 hours) at 06/23/16 0932 Last data filed at 06/23/16 0715  Gross per 24 hour  Intake                3 ml  Output                0 ml  Net                3 ml    PHYSICAL EXAMINATION:  GENERAL:  81 y.o.-year-old patient lying in the bed with no acute distress.  EYES: Pupils equal, round, reactive to light and accommodation. No scleral icterus. Extraocular muscles intact.  HEENT: Head atraumatic, normocephalic. Oropharynx and nasopharynx clear.  NECK:  Supple, no jugular venous distention. No thyroid enlargement, no tenderness.  LUNGS: Normal breath sounds bilaterally, no wheezing, rales,rhonchi or crepitation. No use of accessory muscles of respiration.  CARDIOVASCULAR: S1, S2 normal. No murmurs, rubs, or  gallops.  ABDOMEN: Soft, non-tender, non-distended. Bowel sounds present. No organomegaly or mass.  EXTREMITIES: No pedal edema, cyanosis, or clubbing.  NEUROLOGIC: Cranial nerves II through XII are intact. Muscle strength 5/5 in all extremities. Sensation intact. Gait not checked.  PSYCHIATRIC: The patient is alert and oriented x 3.  SKIN: No obvious rash, lesion, or ulcer.   DATA REVIEW:   CBC  Recent Labs Lab 06/22/16 0327  WBC 10.8  HGB 13.1  HCT 38.9  PLT 247    Chemistries   Recent Labs Lab 06/16/16  1921  06/22/16 0327 06/23/16 0454  NA 141  < > 138  --   K 3.0*  < > 4.9  --   CL 106  < > 102  --   CO2 26  < > 26  --   GLUCOSE 150*  < > 189*  --   BUN 14  < > 36*  --   CREATININE 0.73  < > 1.07* 1.26*  CALCIUM 8.6*  < > 8.6*  --   MG 1.4*  --   --   --   < > = values in this interval not displayed.  Cardiac Enzymes  Recent Labs Lab 06/16/16 1921  TROPONINI <0.03    Microbiology Results  Results for orders placed or performed during the hospital encounter of 06/16/16  Culture, sputum-assessment     Status: None   Collection Time: 06/17/16  8:26 AM  Result Value Ref Range Status   Specimen Description SPUTUM  Final   Special Requests Immunocompromised  Final   Sputum evaluation THIS SPECIMEN IS ACCEPTABLE FOR SPUTUM CULTURE  Final   Report Status 06/17/2016 FINAL  Final  Culture, respiratory (NON-Expectorated)     Status: None   Collection Time: 06/17/16  8:26 AM  Result Value Ref Range Status   Specimen Description SPUTUM  Final   Special Requests Immunocompromised Reflexed from [id number]  Final   Gram Stain   Final    FEW WBC PRESENT, PREDOMINANTLY MONONUCLEAR FEW GRAM POSITIVE COCCI IN PAIRS FEW GRAM POSITIVE RODS    Culture MODERATE STREPTOCOCCUS MITIS/ORALIS  Final   Report Status 06/19/2016 FINAL  Final   Organism ID, Bacteria STREPTOCOCCUS MITIS/ORALIS  Final      Susceptibility   Streptococcus mitis/oralis - MIC*    ERYTHROMYCIN 2  RESISTANT Resistant     TETRACYCLINE >=16 RESISTANT Resistant     VANCOMYCIN 0.5 SENSITIVE Sensitive     CLINDAMYCIN <=0.25 SENSITIVE Sensitive     PENICILLIN 0.25 INTERMEDIATE Intermediate     CEFTRIAXONE 0.5 SENSITIVE Sensitive     LEVOFLOXACIN Value in next row Sensitive      1 SENSITIVEPerformed at [facility], [address] [place], [place] [address]    * MODERATE STREPTOCOCCUS MITIS/ORALIS  Respiratory Panel by PCR     Status: Abnormal   Collection Time: 06/19/16  8:20 PM  Result Value Ref Range Status   Adenovirus NOT DETECTED NOT DETECTED Final   Coronavirus 229E NOT DETECTED NOT DETECTED Final   Coronavirus HKU1 NOT DETECTED NOT DETECTED Final   Coronavirus NL63 NOT DETECTED NOT DETECTED Final   Coronavirus OC43 NOT DETECTED NOT DETECTED Final   Metapneumovirus NOT DETECTED NOT DETECTED Final   Rhinovirus / Enterovirus DETECTED (A) NOT DETECTED Final   Influenza A NOT DETECTED NOT DETECTED Final   Influenza B NOT DETECTED NOT DETECTED Final   Parainfluenza Virus 1 NOT DETECTED NOT DETECTED Final   Parainfluenza Virus 2 NOT DETECTED NOT DETECTED Final   Parainfluenza Virus 3 NOT DETECTED NOT DETECTED Final   Parainfluenza Virus 4 NOT DETECTED NOT DETECTED Final   Respiratory Syncytial Virus NOT DETECTED NOT DETECTED Final   Bordetella pertussis NOT DETECTED NOT DETECTED Final   Chlamydophila pneumoniae NOT DETECTED NOT DETECTED Final   Mycoplasma pneumoniae NOT DETECTED NOT DETECTED Final    Comment: Performed at [facility], [place] [address] [place], [place] [address]    RADIOLOGY:  Dg Chest 2 View  Result Date: 06/21/2016 CLINICAL DATA:  Coughing and wheezing.  EXAM: CHEST  2 VIEW COMPARISON:  CT 06/17/2016.  Chest x-ray 06/16/2016. FINDINGS: Mediastinum hilar structures are normal. Prominent epicardial fat pad on the left. Heart size stable. No focal infiltrate. No pleural effusion or pneumothorax. IMPRESSION: No acute cardiopulmonary disease identified.  Electronically Signed   By: Marcello Moores  Register   On: 06/21/2016 10:44    EKG:   Orders placed or performed during the hospital encounter of 06/16/16  . EKG test  . EKG test  . EKG 12-Lead  . EKG 12-Lead  . EKG 12-Lead      Management plans discussed with the patient, family and they are in agreement.  CODE STATUS:     Code Status Orders        Start     Ordered   06/16/16 2247  Full code  Continuous     06/16/16 2246    Code Status History    Date Active Date Inactive Code Status Order ID Comments User Context   09/22/2015  4:17 PM 09/23/2015  3:55 PM Full Code [id number]  Lytle [place], MD ED   06/13/2014  5:23 AM 06/14/2014  5:50 PM Full Code [id number]  Harrie Foreman, MD Inpatient      TOTAL TIME TAKING CARE OF THIS PATIENT: 45  minutes.   Note: This dictation was prepared with Dragon dictation along with smaller phrase technology. Any transcriptional errors that result from this process are unintentional.   @[facility] @  on 06/23/2016 at 9:32 AM  Between 7am to 6pm - Pager - [phone]  After 6pm go to [email or web address] - password [email or web address] [facility]  [facility] [place] [facility]  Office  [phone]  CC: Primary care physician; Sofie Hartigan, MD

## 2016-06-23 NOTE — Care Management Note (Signed)
Case Management Note  Patient Details  Name: Jenna [name] MRN: [id number] Date of Birth: 04/10/1935  Subjective/Objective:    Discussed discharge planning with Mr Sze, patient's husband. He plans to transport [name] home today. Mr Cieslinski reports that [name] has a home nebulizer, RW's, "everything she needs at home".  [name] is currently on room air and her nurse reports no oxygen upon exertion is needed. No home health orders. No other needs identified.                Action/Plan:   Expected Discharge Date:  06/23/16               Expected Discharge Plan:   06/23/16  In-House Referral:     Discharge planning Services   NA (Patient or husband to arrange OP-PT through Mrs Stouffer PCP.)  Post Acute Care Choice:   NA Choice offered to:   NA  DME Arranged:   NA DME Agency:   NA  HH Arranged:   NA HH Agency:   NA  Status of Service:   Completed  If discussed at [place] of Stay Meetings, dates discussed:    Additional Comments:  Elysse Polidore A, RN 06/23/2016, 9:49 AM

## 2017-08-25 LAB — HM DEXA SCAN

## 2017-09-24 ENCOUNTER — Emergency Department
Admission: EM | Admit: 2017-09-24 | Discharge: 2017-09-24 | Disposition: A | Discharge status: Home / Self Care | Payer: Medicare Other | Attending: Emergency Medicine | Admitting: Emergency Medicine

## 2017-09-24 ENCOUNTER — Other Ambulatory Visit: Payer: Self-pay

## 2017-09-24 ENCOUNTER — Emergency Department: Payer: Medicare Other

## 2017-09-24 DIAGNOSIS — Y92008 Other place in unspecified non-institutional (private) residence as the place of occurrence of the external cause: Secondary | ICD-10-CM | POA: Insufficient documentation

## 2017-09-24 DIAGNOSIS — I10 Essential (primary) hypertension: Secondary | ICD-10-CM | POA: Diagnosis not present

## 2017-09-24 DIAGNOSIS — Y999 Unspecified external cause status: Secondary | ICD-10-CM | POA: Diagnosis not present

## 2017-09-24 DIAGNOSIS — S8001XA Contusion of right knee, initial encounter: Secondary | ICD-10-CM | POA: Diagnosis not present

## 2017-09-24 DIAGNOSIS — Z96653 Presence of artificial knee joint, bilateral: Secondary | ICD-10-CM | POA: Diagnosis not present

## 2017-09-24 DIAGNOSIS — W102XXA Fall (on)(from) incline, initial encounter: Secondary | ICD-10-CM | POA: Insufficient documentation

## 2017-09-24 DIAGNOSIS — S50311A Abrasion of right elbow, initial encounter: Secondary | ICD-10-CM | POA: Diagnosis not present

## 2017-09-24 DIAGNOSIS — S5001XA Contusion of right elbow, initial encounter: Secondary | ICD-10-CM | POA: Insufficient documentation

## 2017-09-24 DIAGNOSIS — Z87891 Personal history of nicotine dependence: Secondary | ICD-10-CM | POA: Insufficient documentation

## 2017-09-24 DIAGNOSIS — Y9301 Activity, walking, marching and hiking: Secondary | ICD-10-CM | POA: Diagnosis not present

## 2017-09-24 DIAGNOSIS — W19XXXA Unspecified fall, initial encounter: Secondary | ICD-10-CM

## 2017-09-24 DIAGNOSIS — Z79899 Other long term (current) drug therapy: Secondary | ICD-10-CM | POA: Diagnosis not present

## 2017-09-24 DIAGNOSIS — S20211A Contusion of right front wall of thorax, initial encounter: Secondary | ICD-10-CM

## 2017-09-24 DIAGNOSIS — J449 Chronic obstructive pulmonary disease, unspecified: Secondary | ICD-10-CM | POA: Insufficient documentation

## 2017-09-24 DIAGNOSIS — S299XXA Unspecified injury of thorax, initial encounter: Secondary | ICD-10-CM | POA: Diagnosis present

## 2017-09-24 MED ORDER — PREDNISONE 50 MG PO TABS: 50.0000 mg | ORAL_TABLET | Freq: Every day | ORAL | 0 refills | Status: DC

## 2017-09-24 MED ORDER — TRAMADOL HCL 50 MG PO TABS: 50.0000 mg | ORAL_TABLET | Freq: Four times a day (QID) | ORAL | 0 refills | Status: DC | PRN

## 2017-09-24 NOTE — ED Notes (Signed)
Pt reports that she was going down the ramp and tripped on a mat - she landed on concrete and hit her right arm and right ribs - denies hitting head - denies LOC

## 2017-09-24 NOTE — ED Triage Notes (Addendum)
Mechanical fall to right side onto cement. C/o right sided rib pain and right elbow pain. Abrasion to right elbow.  Pt alert and oriented X4, active, cooperative, pt in NAD. RR even and unlabored, color WNL.   no loc. Did not hit head. Has full ROM of right arm including elbow.

## 2017-09-24 NOTE — ED Provider Notes (Signed)
[facility] Emergency Department Provider Note  ____________________________________________  Time seen: Approximately 7:12 PM  I have reviewed the triage vital signs and the nursing notes.   HISTORY  Chief Complaint Fall    HPI Jenna [name] is a 82 y.o. female who presents the emergency department complaining of right rib, right elbow, right knee pain.  Patient reports that she was walking down a ramp at her home, tripped over a raised portion causing her to fall and landed on her right side.  Patient did not hit her head or lose consciousness.  Patient complains of right elbow, right knee, right rib pain.  No shortness of breath or difficulty breathing.  She is able to bear weight on bilateral lower extremities.  She is able to move her right elbow appropriately.  Patient did sustain an abrasion to the right elbow but states that there was minimal bleeding.  Patient has not had any medications for this complaint prior to arrival.  No other complaints at this time.    Past Medical History:  Diagnosis Date  . Arthritis    neck, knees(before replacements)  . Asthma   . COPD (chronic obstructive pulmonary disease) ([place])   . Depression   . Encounter for colonoscopy due to history of adenomatous colonic polyps   . GERD (gastroesophageal reflux disease)   . Hearing loss of both ears   . Hyperlipidemia   . Hypertension   . Palpitations   . Sleep apnea    resolved with gastric bypass  . Syncope and collapse     Patient Active Problem List   Diagnosis Date Noted  . COPD exacerbation ([place]) 06/16/2016  . Hepatic failure, acute 09/22/2015  . Morbid obesity ([place]) 11/13/2013  . Hyperlipidemia 11/13/2013  . Esophageal reflux 11/13/2013  . Bilateral leg edema 11/13/2013  . Chronic cough 11/13/2013    Past Surgical History:  Procedure Laterality Date  . BROW LIFT Bilateral 06/27/2015   Procedure: BLEPHAROPLASTY BILATERAL UPPER EYELIDS BILATERAL  BLEPHAROTOSIS EYELIDS;  Surgeon: Karle Starch, MD;  Location: [place];  Service: Ophthalmology;  Laterality: Bilateral;  BILATERAL  . CATARACT EXTRACTION W/PHACO Right 02/13/2015   Procedure: CATARACT EXTRACTION PHACO AND INTRAOCULAR LENS PLACEMENT (IOC);  Surgeon: Ronnell Freshwater, MD;  Location: [facility];  Service: Ophthalmology;  Laterality: Right;  . CATARACT EXTRACTION W/PHACO Left 03/22/2015   Procedure: CATARACT EXTRACTION PHACO AND INTRAOCULAR LENS PLACEMENT (IOC);  Surgeon: Ronnell Freshwater, MD;  Location: [facility];  Service: Ophthalmology;  Laterality: Left;  TORIC  . CHOLECYSTECTOMY  1984  . COLECTOMY    . COSMETIC SURGERY  2012   tummy tuck and excess skin removal  . GASTRIC BYPASS  2010  . REPLACEMENT TOTAL KNEE BILATERAL  1998  . SHOULDER SURGERY  2009   left   . TONSILLECTOMY    . TOTAL ABDOMINAL HYSTERECTOMY  1979    Prior to Admission medications   Medication Sig Start Date End Date Taking? Authorizing Provider  albuterol (PROVENTIL HFA;VENTOLIN HFA) 108 (90 BASE) MCG/ACT inhaler Inhale 2 puffs into the lungs every 6 (six) hours as needed for wheezing or shortness of breath. 06/14/14   Demetrios Loll, MD  Ascorbic Acid (VITAMIN C) 1000 MG tablet Take 500 mg by mouth daily.     [provider]  benzonatate (TESSALON) 100 MG capsule Take 100-200 mg by mouth 3 (three) times daily as needed for cough.    [provider]  Cholecalciferol (VITAMIN D-1000 MAX ST)  1000 UNITS tablet Take 1,000 Units by mouth daily.     [provider]  citalopram (CELEXA) 40 MG tablet Take 40 mg by mouth daily.     [provider]  Cyanocobalamin (RA VITAMIN B-12 TR) 1000 MCG TBCR Take 1,000 mcg by mouth daily.     [provider]  ferrous sulfate 325 (65 FE) MG EC tablet Take 325 mg by mouth at bedtime.     [provider]  furosemide (LASIX) 20 MG tablet Take 20 mg by mouth daily.  09/28/13   [provider]  ipratropium (ATROVENT) 0.02 % nebulizer solution Take 0.5 mg by nebulization 4 (four) times daily.    [provider]  Multiple Vitamins-Minerals (ONE-A-DAY WOMENS PETITES) TABS Take 2 tablets by mouth every morning.    [provider]  Omega-3 Fatty Acids (FISH OIL) 1000 MG CAPS Take 1,000 mg by mouth daily.    [provider]  predniSONE (STERAPRED UNI-PAK 21 TAB) 10 MG (21) TBPK tablet Take by mouth daily. Take 6 tablets by mouth for 1 day followed by  5 tablets by mouth for 1 day followed by  4 tablets by mouth for 1 day followed by  3 tablets by mouth for 1 day followed by  2 tablets by mouth for 1 day followed by  1 tablet by mouth for a day and stop 06/23/16   Nicholes Mango, MD  ranitidine (ZANTAC) 150 MG tablet Take 1 tablet (150 mg total) by mouth 2 (two) times daily. 06/19/16 06/19/17  Bettey Costa, MD  simvastatin (ZOCOR) 40 MG tablet Take 40 mg by mouth daily.     [provider]    Allergies Azithromycin; Codeine; Hydromorphone; Morphine; Nsaids; Oxycodone; Oxycodone-acetaminophen; Nabumetone; and Promethazine hcl  Family History  Problem Relation Age of Onset  . Heart attack Mother   . Hypertension Mother   . Heart attack Brother   . Heart attack Sister 60  . Hyperlipidemia Sister   . Hypertension Sister     Social History Social History   Tobacco Use  . Smoking status: Former Smoker    Packs/day: 0.25    Years: 15.00    Pack years: 3.75    Types: Cigarettes  . Smokeless tobacco: Never Used  Substance Use Topics  . Alcohol use: No  . Drug use: No     Review of Systems  Constitutional: No fever/chills Eyes: No visual changes.  Cardiovascular: no chest pain. Respiratory: no cough. No SOB. Gastrointestinal: No abdominal pain.  No nausea, no vomiting.  Musculoskeletal: Positive for right elbow pain, right rib pain, right knee pain Skin: Negative for rash, abrasions, lacerations, ecchymosis. Neurological:  Negative for headaches, focal weakness or numbness. 10-point ROS otherwise negative.  ____________________________________________   PHYSICAL EXAM:  VITAL SIGNS: ED Triage Vitals [09/24/17 1801]  Enc Vitals Group     BP (!) 147/63     Pulse Rate 74     Resp 16     Temp 98.4 F (36.9 C)     Temp Source Oral     SpO2 98 %     Weight 217 lb 6 oz (98.6 kg)     Height 5\' 2"  (1.575 m)     Head Circumference      Peak Flow      Pain Score 5     Pain Loc      Pain Edu?      Excl. in [place]?      Constitutional: Alert and  oriented. Well appearing and in no acute distress. Eyes: Conjunctivae are normal. PERRL. EOMI. Head: Atraumatic. ENT:      Ears:       Nose: No congestion/rhinnorhea.      Mouth/Throat: Mucous membranes are moist.  Neck: No stridor.  No cervical spine tenderness to palpation.  Cardiovascular: Normal rate, regular rhythm. Normal S1 and S2.  Good peripheral circulation. Respiratory: Normal respiratory effort without tachypnea or retractions. Lungs CTAB. Good air entry to the bases with no decreased or absent breath sounds. Musculoskeletal: Full range of motion to all extremities. No gross deformities appreciated.  Visualization of the ribs reveals no acute visible abnormality or signs of trauma.  Equal chest rise and fall.  No paradoxical chest wall movement.  Palpation reveals diffuse tenderness to palpation without specific point tenderness.  No palpable abnormality.  Good underlying breath sounds bilaterally.  Visualization of the right elbow reveals mild edema, ecchymosis, abrasion to the posterior aspect.  Patient is tender to palpation over the posterior elbow.  No palpable abnormality other than hematoma.  Patient is able to extend and flex, supinate and pronate the elbow appropriately.  Radial pulse intact distally.  Sensation intact distally.  Visualization of the right knee reveals no visible signs of trauma.  Full range of motion to the knee.  Mild tenderness to  palpation along the patella and bilateral joint lines.  No palpable abnormality or deficit.  Varus, valgus, Lachman's, McMurray's is negative.  Dorsalis pedis pulse intact distally.  Sensation intact distally. Neurologic:  Normal speech and language. No gross focal neurologic deficits are appreciated.  Skin:  Skin is warm, dry and intact. No rash noted. Psychiatric: Mood and affect are normal. Speech and behavior are normal. Patient exhibits appropriate insight and judgement.   ____________________________________________   LABS (all labs ordered are listed, but only abnormal results are displayed)  Labs Reviewed - No data to display ____________________________________________  EKG   ____________________________________________  RADIOLOGY I personally viewed and evaluated these images as part of my medical decision making, as well as reviewing the written report by the radiologist.  Dg Ribs Unilateral W/chest Right  Result Date: 09/24/2017 CLINICAL DATA:  Fall onto right side. EXAM: RIGHT RIBS AND CHEST - 3+ VIEW COMPARISON:  None. FINDINGS: No fracture or other bone lesions are seen involving the ribs. There is no evidence of pneumothorax or pleural effusion. Both lungs are clear. Heart size and mediastinal contours are within normal limits. IMPRESSION: No rib fracture. Electronically Signed   By: Ulyses Jarred M.D.   On: 09/24/2017 18:55   Dg Elbow Complete Right  Result Date: 09/24/2017 CLINICAL DATA:  82 year old female status post fall onto cement with pain. EXAM: RIGHT ELBOW - COMPLETE 3+ VIEW COMPARISON:  None. FINDINGS: Bone mineralization is within normal limits for age. Preserved joint spaces and alignment. No joint effusion identified. The radial head appears intact. No acute osseous abnormality identified. No discrete soft tissue injury. IMPRESSION: No acute fracture or dislocation identified about the right elbow. Electronically Signed   By: Genevie Ann M.D.   On: 09/24/2017 18:56     ____________________________________________    PROCEDURES  Procedure(s) performed:    Marland KitchenMarland KitchenLaceration Repair Date/Time: 09/24/2017 7:33 PM Performed by: Darletta Moll, PA-C Authorized by: Darletta Moll, PA-C   Consent:    Consent obtained:  Verbal   Consent given by:  Patient   Risks discussed:  Pain Anesthesia (see MAR for exact dosages):    Anesthesia method:  None Laceration details:  Location:  Shoulder/arm   Shoulder/arm location:  R elbow Repair type:    Repair type:  Simple Pre-procedure details:    Preparation:  Imaging obtained to evaluate for foreign bodies Exploration:    Wound exploration: wound explored through full range of motion and entire depth of wound probed and visualized     Wound extent: no foreign bodies/material noted, no muscle damage noted, no nerve damage noted, no tendon damage noted, no underlying fracture noted and no vascular damage noted     Contaminated: yes (dirt)   Treatment:    Area cleansed with:  Shur-Clens   Amount of cleaning:  Extensive   Irrigation solution:  Sterile saline   Irrigation volume:  500 ml   Irrigation method:  Syringe Post-procedure details:    Dressing:  Non-adherent dressing and bulky dressing   Patient tolerance of procedure:  Tolerated well, no immediate complications Comments:     Abrasion was thoroughly cleansed and debrided.  No visible remaining dirt/gravel.  Wound was dressed.  No indication for closure with Steri-Strips, tissue adhesive or sutures.      Medications - No data to display   ____________________________________________   INITIAL IMPRESSION / ASSESSMENT AND PLAN / ED COURSE  Pertinent labs & imaging results that were available during my care of the patient were reviewed by me and considered in my medical decision making (see chart for details).  Review of the [place] CSRS was performed in accordance of the [place] prior to dispensing any controlled drugs.       Patient's diagnosis is consistent with fall resulting in right elbow, right rib, right knee contusions.  Patient also sustained an abrasion to the right elbow.  Tetanus shot was up-to-date.  All with no contact to the head or neck region.  Patient denied any headache or neck pain.  Fall was mechanical in nature.  Exam was overall reassuring.  Imaging reveals no acute osseous abnormality.  Differential included fractures, contusions, dislocations, pneumothorax, pulmonary contusion.  No cardiopulmonary abnormality on chest x-ray.  Patient will be prescribed short course of prednisone for inflammation control, limited prescription of Ultram for pain management..  Patient will follow-up with primary care as needed.  Patient is given ED precautions to return to the ED for any worsening or new symptoms.     ____________________________________________  FINAL CLINICAL IMPRESSION(S) / ED DIAGNOSES  Final diagnoses:  Fall, initial encounter  Contusion of rib on right side, initial encounter      NEW MEDICATIONS STARTED DURING THIS VISIT:  ED Discharge Orders    None          This chart was dictated using voice recognition software/Dragon. Despite best efforts to proofread, errors can occur which can change the meaning. Any change was purely unintentional.    Darletta Moll, PA-C 09/24/17 1936    Schuyler Amor, MD 09/24/17 2132

## 2017-11-10 DIAGNOSIS — H35352 Cystoid macular degeneration, left eye: Secondary | ICD-10-CM | POA: Diagnosis not present

## 2017-11-10 DIAGNOSIS — H35351 Cystoid macular degeneration, right eye: Secondary | ICD-10-CM | POA: Diagnosis not present

## 2017-11-12 ENCOUNTER — Ambulatory Visit (INDEPENDENT_AMBULATORY_CARE_PROVIDER_SITE_OTHER): Payer: Medicare Other | Admitting: Family Medicine

## 2017-11-12 ENCOUNTER — Encounter: Payer: Self-pay | Admitting: Family Medicine

## 2017-11-12 VITALS — BP 140/96 | HR 74 | Temp 98.2°F | Resp 16 | Ht 63.0 in | Wt 209.0 lb

## 2017-11-12 DIAGNOSIS — J432 Centrilobular emphysema: Secondary | ICD-10-CM | POA: Insufficient documentation

## 2017-11-12 DIAGNOSIS — M150 Primary generalized (osteo)arthritis: Secondary | ICD-10-CM

## 2017-11-12 DIAGNOSIS — E669 Obesity, unspecified: Secondary | ICD-10-CM

## 2017-11-12 DIAGNOSIS — F331 Major depressive disorder, recurrent, moderate: Secondary | ICD-10-CM

## 2017-11-12 DIAGNOSIS — M19041 Primary osteoarthritis, right hand: Secondary | ICD-10-CM | POA: Diagnosis not present

## 2017-11-12 DIAGNOSIS — Z7689 Persons encountering health services in other specified circumstances: Secondary | ICD-10-CM

## 2017-11-12 DIAGNOSIS — F5104 Psychophysiologic insomnia: Secondary | ICD-10-CM

## 2017-11-12 DIAGNOSIS — E1121 Type 2 diabetes mellitus with diabetic nephropathy: Secondary | ICD-10-CM

## 2017-11-12 DIAGNOSIS — F339 Major depressive disorder, recurrent, unspecified: Secondary | ICD-10-CM | POA: Insufficient documentation

## 2017-11-12 DIAGNOSIS — M19042 Primary osteoarthritis, left hand: Secondary | ICD-10-CM

## 2017-11-12 DIAGNOSIS — M159 Polyosteoarthritis, unspecified: Secondary | ICD-10-CM

## 2017-11-12 DIAGNOSIS — M8949 Other hypertrophic osteoarthropathy, multiple sites: Secondary | ICD-10-CM | POA: Insufficient documentation

## 2017-11-12 MED ORDER — DICLOFENAC SODIUM 1 % TD GEL: 2.0000 g | Freq: Three times a day (TID) | TRANSDERMAL | 2 refills | Status: DC | PRN

## 2017-11-12 MED ORDER — TRAZODONE HCL 50 MG PO TABS: 25.0000 mg | ORAL_TABLET | Freq: Every evening | ORAL | 2 refills | Status: DC | PRN

## 2017-11-12 NOTE — Progress Notes (Signed)
Subjective:    Patient ID: Jenna [name], female    DOB: 07/29/1935, 82 y.o.   MRN: [id number]  Jenna [name] is a 82 y.o. female presenting on 11/12/2017 for Establish Care (arthritis getting worst); Insomnia; and Arthritis  Here for establish care, switch from prior PCP [facility] Primary [facility] Dr Ellison Hughs Accompanied by husband, Delrae Alfred, for additional history.  HPI   Arthritis, multiple joints (hands bilateral), neck pain Reports chronic history arthritis degenerative, multiple joints, has been treated with various medications in past including NSAID now cannot take these NSAID due to s/p gastric bypass surgery. She has followed with [facility] Dr Marry Guan as well for Knees, she has history of s/p bilateral total knee arthroplasty back in 1998. Last seen by Ortho in 01/2017, had x-rays of knees repeated, that showed intact prosthesis, recommended home exercise program and follow-up yearly. - Today complains about gradual worsening problem with chronic hand pain - Additionally reports >15 years ago severe fall injury and had hematoma, and knot on R clavicle  Insomnia / Chronic Depression, recurrent / Anxiety Background history of depression in past, seems episodic, has some worsening at times, has been stable on Citalopram 40mg  daily for long time from prior PCP, does not recall exact other medications, said on some others many years ago. - She states her mood and insomnia started worsening >4 years ago since life stressors / family stress caring for her daughter who had moved back and required financial assistance. Now daughter may be getting new job soon. - Reports chronic insomnia, has not discussed it with doctor before, >4 years or so with worsening of this problem. Has problem staying asleep overnight, will wake up cannot go back to sleep, takes a nap often during day  CHRONIC DM, Type 2, w/ nephropathy / CKD-III Last lab result A1c 6.5 (08/20/17). Prior lab showed Cr up to 1.2,  now recently had improved back to baseline Cr 0.8 Meds: None Currently not on ACEi / ARB Denies hypoglycemia, polyuria, visual changes, numbness or tingling.  COPD History of former smoker. No recent COPD flare. Has albuterol PRN. No recent concerns.  Obesity BMI >37 Weight loss in 2010 - gastric bypass Has gained some weight back  Health Maintenance:  [facility] flu shot few weeks ago.  UTD Shingles S/p Prevnar-13 11/01/15 per careeverywhere - has not had Pneumovax-23  Depression screen PHQ 2/9 11/13/2017  Decreased Interest 1  Down, Depressed, Hopeless 1  PHQ - 2 Score 2  Altered sleeping 3  Tired, decreased energy 2  Change in appetite 1  Feeling bad or failure about yourself  0  Trouble concentrating 0  Moving slowly or fidgety/restless 1  Suicidal thoughts 0  PHQ-9 Score 9  Difficult doing work/chores Somewhat difficult   GAD 7 : Generalized Anxiety Score 11/13/2017  Nervous, Anxious, on Edge 0  Control/stop worrying 0  Worry too much - different things 0  Trouble relaxing 1  Restless 1  Easily annoyed or irritable 1  Afraid - awful might happen 0  Total GAD 7 Score 3  Anxiety Difficulty Not difficult at all    Past Medical History:  Diagnosis Date  . Anxiety   . Arthritis    neck, knees(before replacements)  . Asthma   . COPD (chronic obstructive pulmonary disease) ([place])   . Encounter for colonoscopy due to history of adenomatous colonic polyps   . GERD (gastroesophageal reflux disease)   . Hearing loss of both ears   . Hyperlipidemia   .  Osteoporosis   . Palpitations   . Sleep apnea    resolved with gastric bypass  . Syncope and collapse   . Urine incontinence    Past Surgical History:  Procedure Laterality Date  . BROW LIFT Bilateral 06/27/2015   Procedure: BLEPHAROPLASTY BILATERAL UPPER EYELIDS BILATERAL BLEPHAROTOSIS EYELIDS;  Surgeon: Karle Starch, MD;  Location: [place];  Service: Ophthalmology;  Laterality: Bilateral;  BILATERAL    . CATARACT EXTRACTION W/PHACO Right 02/13/2015   Procedure: CATARACT EXTRACTION PHACO AND INTRAOCULAR LENS PLACEMENT (IOC);  Surgeon: Ronnell Freshwater, MD;  Location: [place];  Service: Ophthalmology;  Laterality: Right;  . CATARACT EXTRACTION W/PHACO Left 03/22/2015   Procedure: CATARACT EXTRACTION PHACO AND INTRAOCULAR LENS PLACEMENT (IOC);  Surgeon: Ronnell Freshwater, MD;  Location: [place];  Service: Ophthalmology;  Laterality: Left;  TORIC  . CHOLECYSTECTOMY  1984  . COLECTOMY    . COSMETIC SURGERY  2012   tummy tuck and excess skin removal  . GASTRIC BYPASS  2010  . REPLACEMENT TOTAL KNEE BILATERAL  1998  . SHOULDER SURGERY  2009   left   . TONSILLECTOMY    . TOTAL ABDOMINAL HYSTERECTOMY  1979   Social History   Socioeconomic History  . Marital status: Married    Spouse name: Not on file  . Number of children: Not on file  . Years of education: Not on file  . Highest education level: Not on file  Occupational History  . Not on file  Social Needs  . Financial resource strain: Not on file  . Food insecurity:    Worry: Not on file    Inability: Not on file  . Transportation needs:    Medical: Not on file    Non-medical: Not on file  Tobacco Use  . Smoking status: Former Smoker    Packs/day: 0.25    Years: 15.00    Pack years: 3.75    Types: Cigarettes  . Smokeless tobacco: Former Network engineer and Sexual Activity  . Alcohol use: Yes    Comment: past  . Drug use: No  . Sexual activity: Not on file  Lifestyle  . Physical activity:    Days per week: Not on file    Minutes per session: Not on file  . Stress: Not on file  Relationships  . Social connections:    Talks on phone: Not on file    Gets together: Not on file    Attends religious service: Not on file    Active member of club or organization: Not on file    Attends meetings of clubs or organizations: Not on file    Relationship status: Not on file  . Intimate  partner violence:    Fear of current or ex partner: Not on file    Emotionally abused: Not on file    Physically abused: Not on file    Forced sexual activity: Not on file  Other Topics Concern  . Not on file  Social History Narrative  . Not on file   Family History  Problem Relation Age of Onset  . Heart attack Mother   . Hypertension Mother   . Heart attack Brother   . Heart attack Sister 76  . Hyperlipidemia Sister   . Hypertension Sister    Current Outpatient Medications on File Prior to Visit  Medication Sig  . albuterol (PROVENTIL HFA;VENTOLIN HFA) 108 (90 BASE) MCG/ACT inhaler Inhale 2 puffs into the lungs every 6 (six)  hours as needed for wheezing or shortness of breath.  Marland Kitchen alendronate (FOSAMAX) 70 MG tablet Take by mouth.  . Ascorbic Acid (VITAMIN C) 1000 MG tablet Take 500 mg by mouth daily.   . Cholecalciferol (VITAMIN D-1000 MAX ST) 1000 UNITS tablet Take 1,000 Units by mouth daily.   . citalopram (CELEXA) 40 MG tablet Take 40 mg by mouth daily.   . Cyanocobalamin (RA VITAMIN B-12 TR) 1000 MCG TBCR Take 1,000 mcg by mouth daily.   . ferrous sulfate 325 (65 FE) MG EC tablet Take 325 mg by mouth at bedtime.   . furosemide (LASIX) 20 MG tablet Take 20 mg by mouth daily.   Marland Kitchen ipratropium (ATROVENT) 0.02 % nebulizer solution Take 0.5 mg by nebulization 4 (four) times daily.  . Multiple Vitamins-Minerals (ONE-A-DAY WOMENS PETITES) TABS Take 2 tablets by mouth every morning.  . Omega-3 Fatty Acids (FISH OIL) 1000 MG CAPS Take 1,000 mg by mouth daily.  . simvastatin (ZOCOR) 40 MG tablet Take 40 mg by mouth daily.   . Pediatric Multiple Vit-C-FA (CHEWABLE VITE CHILDRENS) CHEW Chew by mouth.  . ranitidine (ZANTAC) 150 MG tablet Take 1 tablet (150 mg total) by mouth 2 (two) times daily.   No current facility-administered medications on file prior to visit.     Review of Systems Per HPI unless specifically indicated above      Objective:    BP (!) 140/96   Pulse 74    Temp 98.2 F (36.8 C) (Oral)   Resp 16   Ht 5\' 3"  (1.6 m)   Wt 209 lb (94.8 kg)   BMI 37.02 kg/m   Wt Readings from Last 3 Encounters:  11/12/17 209 lb (94.8 kg)  09/24/17 217 lb 6 oz (98.6 kg)  06/16/16 217 lb 4.8 oz (98.6 kg)    Physical Exam  Constitutional: She is oriented to person, place, and time. She appears well-developed and well-nourished. No distress.  Well-appearing, comfortable, cooperative, obese  HENT:  Head: Normocephalic and atraumatic.  Mouth/Throat: Oropharynx is clear and moist.  Eyes: Conjunctivae are normal. Right eye exhibits no discharge. Left eye exhibits no discharge.  Cardiovascular: Normal rate.  Pulmonary/Chest: Effort normal.  Musculoskeletal: She exhibits no edema.  Neurological: She is alert and oriented to person, place, and time.  Skin: Skin is warm and dry. No rash noted. She is not diaphoretic. No erythema.  Psychiatric: She has a normal mood and affect. Her behavior is normal.  Well groomed, good eye contact, normal speech and thoughts  Nursing note and vitals reviewed.  Results for orders placed or performed during the hospital encounter of 06/16/16  Culture, sputum-assessment  Result Value Ref Range   Specimen Description SPUTUM    Special Requests Immunocompromised    Sputum evaluation THIS SPECIMEN IS ACCEPTABLE FOR SPUTUM CULTURE    Report Status 06/17/2016 FINAL   Culture, respiratory (NON-Expectorated)  Result Value Ref Range   Specimen Description SPUTUM    Special Requests Immunocompromised Reflexed from [id number]    Gram Stain      FEW WBC PRESENT, PREDOMINANTLY MONONUCLEAR FEW GRAM POSITIVE COCCI IN PAIRS FEW GRAM POSITIVE RODS    Culture MODERATE STREPTOCOCCUS MITIS/ORALIS    Report Status 06/19/2016 FINAL    Organism ID, Bacteria STREPTOCOCCUS MITIS/ORALIS       Susceptibility   Streptococcus mitis/oralis - MIC*    ERYTHROMYCIN 2 RESISTANT Resistant     TETRACYCLINE >=16 RESISTANT Resistant     VANCOMYCIN 0.5 SENSITIVE  Sensitive  CLINDAMYCIN <=0.25 SENSITIVE Sensitive     PENICILLIN 0.25 INTERMEDIATE Intermediate     CEFTRIAXONE 0.5 SENSITIVE Sensitive     LEVOFLOXACIN Value in next row Sensitive      1 SENSITIVEPerformed at [facility] [address] [place], [place] [address]    * MODERATE STREPTOCOCCUS MITIS/ORALIS  Respiratory Panel by PCR  Result Value Ref Range   Adenovirus NOT DETECTED NOT DETECTED   Coronavirus 229E NOT DETECTED NOT DETECTED   Coronavirus HKU1 NOT DETECTED NOT DETECTED   Coronavirus NL63 NOT DETECTED NOT DETECTED   Coronavirus OC43 NOT DETECTED NOT DETECTED   Metapneumovirus NOT DETECTED NOT DETECTED   Rhinovirus / Enterovirus DETECTED (A) NOT DETECTED   Influenza A NOT DETECTED NOT DETECTED   Influenza B NOT DETECTED NOT DETECTED   Parainfluenza Virus 1 NOT DETECTED NOT DETECTED   Parainfluenza Virus 2 NOT DETECTED NOT DETECTED   Parainfluenza Virus 3 NOT DETECTED NOT DETECTED   Parainfluenza Virus 4 NOT DETECTED NOT DETECTED   Respiratory Syncytial Virus NOT DETECTED NOT DETECTED   Bordetella pertussis NOT DETECTED NOT DETECTED   Chlamydophila pneumoniae NOT DETECTED NOT DETECTED   Mycoplasma pneumoniae NOT DETECTED NOT DETECTED  CBC  Result Value Ref Range   WBC 7.0 3.6 - 11.0 K/uL   RBC 3.82 3.80 - 5.20 MIL/uL   Hemoglobin 12.7 12.0 - 16.0 g/dL   HCT 36.7 35.0 - 47.0 %   MCV 96.1 80.0 - 100.0 fL   MCH 33.3 26.0 - 34.0 pg   MCHC 34.7 32.0 - 36.0 g/dL   RDW 12.8 11.5 - 14.5 %   Platelets 188 150 - 440 K/uL  Basic metabolic panel  Result Value Ref Range   Sodium 141 135 - 145 mmol/L   Potassium 3.0 (L) 3.5 - 5.1 mmol/L   Chloride 106 101 - 111 mmol/L   CO2 26 22 - 32 mmol/L   Glucose, Bld 150 (H) 65 - 99 mg/dL   BUN 14 6 - 20 mg/dL   Creatinine, Ser 0.73 0.44 - 1.00 mg/dL   Calcium 8.6 (L) 8.9 - 10.3 mg/dL   GFR calc non Af Amer >60 >60 mL/min   GFR calc Af Amer >60 >60 mL/min   Anion gap 9 5 - 15  Troponin I  Result Value Ref Range    Troponin I <0.03 <0.03 ng/mL  Fibrin derivatives D-Dimer ([facility] only)  Result Value Ref Range   Fibrin derivatives D-dimer ([facility]) 1,375.11 (H) 0.00 - 499.00  Magnesium  Result Value Ref Range   Magnesium 1.4 (L) 1.7 - 2.4 mg/dL  Phosphorus  Result Value Ref Range   Phosphorus 2.8 2.5 - 4.6 mg/dL  Basic metabolic panel  Result Value Ref Range   Sodium 140 135 - 145 mmol/L   Potassium 3.7 3.5 - 5.1 mmol/L   Chloride 104 101 - 111 mmol/L   CO2 23 22 - 32 mmol/L   Glucose, Bld 354 (H) 65 - 99 mg/dL   BUN 15 6 - 20 mg/dL   Creatinine, Ser 0.95 0.44 - 1.00 mg/dL   Calcium 8.5 (L) 8.9 - 10.3 mg/dL   GFR calc non Af Amer 55 (L) >60 mL/min   GFR calc Af Amer >60 >60 mL/min   Anion gap 13 5 - 15  CBC  Result Value Ref Range   WBC 7.3 3.6 - 11.0 K/uL   RBC 3.67 (L) 3.80 - 5.20 MIL/uL   Hemoglobin 12.4 12.0 - 16.0 g/dL   HCT 35.5  35.0 - 47.0 %   MCV 96.9 80.0 - 100.0 fL   MCH 33.8 26.0 - 34.0 pg   MCHC 34.9 32.0 - 36.0 g/dL   RDW 13.0 11.5 - 14.5 %   Platelets 180 150 - 440 K/uL  Hemoglobin A1c  Result Value Ref Range   Hgb A1c MFr Bld 6.1 (H) 4.8 - 5.6 %   Mean Plasma Glucose 128 mg/dL  Glucose, capillary  Result Value Ref Range   Glucose-Capillary 287 (H) 65 - 99 mg/dL  Basic metabolic panel  Result Value Ref Range   Sodium 138 135 - 145 mmol/L   Potassium 4.0 3.5 - 5.1 mmol/L   Chloride 105 101 - 111 mmol/L   CO2 24 22 - 32 mmol/L   Glucose, Bld 257 (H) 65 - 99 mg/dL   BUN 29 (H) 6 - 20 mg/dL   Creatinine, Ser 0.97 0.44 - 1.00 mg/dL   Calcium 8.9 8.9 - 10.3 mg/dL   GFR calc non Af Amer 54 (L) >60 mL/min   GFR calc Af Amer >60 >60 mL/min   Anion gap 9 5 - 15  Glucose, capillary  Result Value Ref Range   Glucose-Capillary 321 (H) 65 - 99 mg/dL  Glucose, capillary  Result Value Ref Range   Glucose-Capillary 117 (H) 65 - 99 mg/dL  Glucose, capillary  Result Value Ref Range   Glucose-Capillary 243 (H) 65 - 99 mg/dL  Glucose, capillary  Result Value Ref Range    Glucose-Capillary 276 (H) 65 - 99 mg/dL  Glucose, capillary  Result Value Ref Range   Glucose-Capillary 139 (H) 65 - 99 mg/dL  Glucose, capillary  Result Value Ref Range   Glucose-Capillary 215 (H) 65 - 99 mg/dL  Glucose, capillary  Result Value Ref Range   Glucose-Capillary 215 (H) 65 - 99 mg/dL  Glucose, capillary  Result Value Ref Range   Glucose-Capillary 170 (H) 65 - 99 mg/dL  Glucose, capillary  Result Value Ref Range   Glucose-Capillary 163 (H) 65 - 99 mg/dL  Glucose, capillary  Result Value Ref Range   Glucose-Capillary 191 (H) 65 - 99 mg/dL  Glucose, capillary  Result Value Ref Range   Glucose-Capillary 228 (H) 65 - 99 mg/dL   Comment 1 Notify RN   Glucose, capillary  Result Value Ref Range   Glucose-Capillary 155 (H) 65 - 99 mg/dL  Glucose, capillary  Result Value Ref Range   Glucose-Capillary 135 (H) 65 - 99 mg/dL   Comment 1 Notify RN   Glucose, capillary  Result Value Ref Range   Glucose-Capillary 124 (H) 65 - 99 mg/dL   Comment 1 Notify RN   Glucose, capillary  Result Value Ref Range   Glucose-Capillary 200 (H) 65 - 99 mg/dL  Glucose, capillary  Result Value Ref Range   Glucose-Capillary 134 (H) 65 - 99 mg/dL  Glucose, capillary  Result Value Ref Range   Glucose-Capillary 218 (H) 65 - 99 mg/dL  CBC  Result Value Ref Range   WBC 10.8 3.6 - 11.0 K/uL   RBC 4.06 3.80 - 5.20 MIL/uL   Hemoglobin 13.1 12.0 - 16.0 g/dL   HCT 38.9 35.0 - 47.0 %   MCV 95.6 80.0 - 100.0 fL   MCH 32.3 26.0 - 34.0 pg   MCHC 33.8 32.0 - 36.0 g/dL   RDW 12.7 11.5 - 14.5 %   Platelets 247 150 - 440 K/uL  Basic metabolic panel  Result Value Ref Range   Sodium 138 135 - 145 mmol/L  Potassium 4.9 3.5 - 5.1 mmol/L   Chloride 102 101 - 111 mmol/L   CO2 26 22 - 32 mmol/L   Glucose, Bld 189 (H) 65 - 99 mg/dL   BUN 36 (H) 6 - 20 mg/dL   Creatinine, Ser 1.07 (H) 0.44 - 1.00 mg/dL   Calcium 8.6 (L) 8.9 - 10.3 mg/dL   GFR calc non Af Amer 48 (L) >60 mL/min   GFR calc Af Amer 56  (L) >60 mL/min   Anion gap 10 5 - 15  Glucose, capillary  Result Value Ref Range   Glucose-Capillary 80 65 - 99 mg/dL  Glucose, capillary  Result Value Ref Range   Glucose-Capillary 212 (H) 65 - 99 mg/dL  Glucose, capillary  Result Value Ref Range   Glucose-Capillary 168 (H) 65 - 99 mg/dL  Glucose, capillary  Result Value Ref Range   Glucose-Capillary 104 (H) 65 - 99 mg/dL  Creatinine, serum  Result Value Ref Range   Creatinine, Ser 1.26 (H) 0.44 - 1.00 mg/dL   GFR calc non Af Amer 39 (L) >60 mL/min   GFR calc Af Amer 46 (L) >60 mL/min  Glucose, capillary  Result Value Ref Range   Glucose-Capillary 232 (H) 65 - 99 mg/dL  Glucose, capillary  Result Value Ref Range   Glucose-Capillary 160 (H) 65 - 99 mg/dL      Assessment & Plan:   Problem List Items Addressed This Visit    Controlled type 2 diabetes mellitus with diabetic nephropathy, without long-term current use of insulin (HCC)    Previuosly well-controlled DM with A1c 6s, last 6.5 3 month ago No hypoglycemia Complications - CKD-III previously has improved, other including hyperlipidemia, GERD, depression, obesity - increases risk of future cardiovascular complications / poor glucose control due to reduced lifestyle diet/exercise with low energy mood and fatigue  Plan:  1. Remain diet controlled, off med 2. Encourage improved lifestyle - low carb, low sugar diet, reduce portion size, continue improving regular exercise 3. Check CBG , bring log to next visit for review 4. Continue Statin = future consider low dose ACEi/ARB 5. Follow-up within 3 months w/ labs and review records for routine DM visit       COPD (chronic obstructive pulmonary disease) ([place])    Stable without exacerbation Former smoker Has albuterol PRN      Depression, major, recurrent ([place])    Stable chronic problem with mood, anxiety, secondary insomnia Recently worse w/ life stressors family related  Plan Encourage continue on current med  Citalopram 40mg  daily Agree to add new treatment Trazodone 50mg  tabs, start half to whole tab nightly for sleep and mood Follow-up sooner if needed 4-6 weeks, may titrate up trazodone dose up to 75 vs 100mg  in future if needed      Relevant Medications   traZODone (DESYREL) 50 MG tablet   Obesity (BMI 35.0-39.9 without comorbidity)    Abnormal weight Encourage improve diet / exercise as tolerated for wt loss      Primary osteoarthritis involving multiple joints - Primary    Currently relatively stable, but has significant problem multiple joints  Plan to review record Should follow-up back with Orthopedics as scheduled Limited options cannot take NSAID Maximize Tylenol PRN Trial on Voltaren topical NSAID for hands and small joint Future consider alternative options such as Cymbalta, Gabapentin, Tramadol  Follow-up      Relevant Medications   diclofenac sodium (VOLTAREN) 1 % GEL   Primary osteoarthritis of both hands    Chronic  worsening OA/DJD multiple joints including hands with pain Cannot take NSAIDs  Plan Start trial topical NSAID Voltaren (Diclofenac) gel TID PRN - will try to get authorized since cannot take NSAID due to s/p gastric bypass surgery and CKD      Relevant Medications   diclofenac sodium (VOLTAREN) 1 % GEL   Psychophysiological insomnia    See A&P for depression      Relevant Medications   traZODone (DESYREL) 50 MG tablet    Other Visit Diagnoses    Encounter to establish care with new doctor        Review outside records, prior PCP   Meds ordered this encounter  Medications  . diclofenac sodium (VOLTAREN) 1 % GEL    Sig: Apply 2 g topically 3 (three) times daily as needed. For hands, arthritis    Dispense:  100 g    Refill:  2  . traZODone (DESYREL) 50 MG tablet    Sig: Take 0.5-1 tablets (25-50 mg total) by mouth at bedtime as needed for sleep.    Dispense:  30 tablet    Refill:  2    Follow up plan: Return in about 3 months (around  02/12/2018) for 3 month follow-up - lab results, arthritis, mood/insomnia, diabetes.  Future labs ordered for 02/06/18  Nobie Putnam, [facility] 11/12/2017, 3:45 PM

## 2017-11-12 NOTE — Patient Instructions (Addendum)
Thank you for coming to the office today.  Start topical anti inflammatory Diclofenac (Voltaren) new rx sent to pharmacy - use on hands/joints wrist up to 3 times a day as needed - we need to approve it first. Since you cannot take anti inflammatories  Recommend to start taking Tylenol Extra Strength 500mg  tabs - take 1 to 2 tabs per dose (max 1000mg ) every 6-8 hours for pain (take regularly, don't skip a dose for next 7 days), max 24 hour daily dose is 6 tablets or 3000mg . In the future you can repeat the same everyday Tylenol course for 1-2 weeks at a time.   Discuss arthritis with Dr Marry Guan as planned and make sure they send [place] a copy of the report.  Next step if cannot get this approved or need a different medicine - would be Gabapentin.  Start Gabapentin 100mg  capsules, take at night for 2-3 nights only, and then increase to 2 times a day for a few days, and then may increase to 3 times a day, it may make you drowsy, if helps significantly at night only, then you can increase instead to 3 capsules at night, instead of 3 times a day - In the future if needed, we can significantly increase the dose if tolerated well, some common doses are 300mg  three times a day up to 600mg  three times a day, usually it takes several weeks or months to get to higher doses  - For mood / insomnia sleep  Start Trazodone 50mg  nightly for insomnia and sleep - this will work with your Citalopram  In future we may adjust dose and increase.  DUE for FASTING BLOOD WORK (no food or drink after midnight before the lab appointment, only water or coffee without cream/sugar on the morning of)  SCHEDULE "Lab Only" visit in the morning at the clinic for lab draw in 3 MONTHS   - Make sure Lab Only appointment is at about 1 week before your next appointment, so that results will be available  For Lab Results, once available within 2-3 days of blood draw, you can can log in to MyChart online to view your results and a brief  explanation. Also, we can discuss results at next follow-up visit.   Please schedule a Follow-up Appointment to: Return in about 3 months (around 02/12/2018) for 3 month follow-up - lab results, arthritis, mood/insomnia, diabetes.  If you have any other questions or concerns, please feel free to call the office or send a message through [place]. You may also schedule an earlier appointment if necessary.  Additionally, you may be receiving a survey about your experience at our office within a few days to 1 week by e-mail or mail. We value your feedback.  Nobie Putnam, DO [place]

## 2017-11-13 ENCOUNTER — Encounter: Payer: Self-pay | Admitting: Family Medicine

## 2017-11-13 ENCOUNTER — Other Ambulatory Visit: Payer: Self-pay | Admitting: Family Medicine

## 2017-11-13 DIAGNOSIS — E1121 Type 2 diabetes mellitus with diabetic nephropathy: Secondary | ICD-10-CM | POA: Insufficient documentation

## 2017-11-13 DIAGNOSIS — M150 Primary generalized (osteo)arthritis: Secondary | ICD-10-CM

## 2017-11-13 DIAGNOSIS — F5104 Psychophysiologic insomnia: Secondary | ICD-10-CM

## 2017-11-13 DIAGNOSIS — M19041 Primary osteoarthritis, right hand: Secondary | ICD-10-CM

## 2017-11-13 DIAGNOSIS — E1165 Type 2 diabetes mellitus with hyperglycemia: Secondary | ICD-10-CM | POA: Insufficient documentation

## 2017-11-13 DIAGNOSIS — M19042 Primary osteoarthritis, left hand: Secondary | ICD-10-CM

## 2017-11-13 DIAGNOSIS — E785 Hyperlipidemia, unspecified: Secondary | ICD-10-CM

## 2017-11-13 DIAGNOSIS — E1169 Type 2 diabetes mellitus with other specified complication: Secondary | ICD-10-CM

## 2017-11-13 DIAGNOSIS — M159 Polyosteoarthritis, unspecified: Secondary | ICD-10-CM

## 2017-11-13 DIAGNOSIS — E669 Obesity, unspecified: Secondary | ICD-10-CM

## 2017-11-13 DIAGNOSIS — F331 Major depressive disorder, recurrent, moderate: Secondary | ICD-10-CM

## 2017-11-13 DIAGNOSIS — M8949 Other hypertrophic osteoarthropathy, multiple sites: Secondary | ICD-10-CM

## 2017-11-13 DIAGNOSIS — E119 Type 2 diabetes mellitus without complications: Secondary | ICD-10-CM | POA: Insufficient documentation

## 2017-11-13 NOTE — Assessment & Plan Note (Signed)
Stable without exacerbation Former smoker Has albuterol PRN

## 2017-11-13 NOTE — Assessment & Plan Note (Signed)
Stable chronic problem with mood, anxiety, secondary insomnia Recently worse w/ life stressors family related  Plan Encourage continue on current med Citalopram 40mg  daily Agree to add new treatment Trazodone 50mg  tabs, start half to whole tab nightly for sleep and mood Follow-up sooner if needed 4-6 weeks, may titrate up trazodone dose up to 75 vs 100mg  in future if needed

## 2017-11-13 NOTE — Assessment & Plan Note (Signed)
Previuosly well-controlled DM with A1c 6s, last 6.5 3 month ago No hypoglycemia Complications - CKD-III previously has improved, other including hyperlipidemia, GERD, depression, obesity - increases risk of future cardiovascular complications / poor glucose control due to reduced lifestyle diet/exercise with low energy mood and fatigue  Plan:  1. Remain diet controlled, off med 2. Encourage improved lifestyle - low carb, low sugar diet, reduce portion size, continue improving regular exercise 3. Check CBG , bring log to next visit for review 4. Continue Statin = future consider low dose ACEi/ARB 5. Follow-up within 3 months w/ labs and review records for routine DM visit

## 2017-11-13 NOTE — Assessment & Plan Note (Signed)
Abnormal weight Encourage improve diet / exercise as tolerated for wt loss

## 2017-11-13 NOTE — Assessment & Plan Note (Signed)
Currently relatively stable, but has significant problem multiple joints  Plan to review record Should follow-up back with Orthopedics as scheduled Limited options cannot take NSAID Maximize Tylenol PRN Trial on Voltaren topical NSAID for hands and small joint Future consider alternative options such as Cymbalta, Gabapentin, Tramadol  Follow-up

## 2017-11-13 NOTE — Assessment & Plan Note (Signed)
See A&P for depression

## 2017-11-13 NOTE — Assessment & Plan Note (Signed)
Chronic worsening OA/DJD multiple joints including hands with pain Cannot take NSAIDs  Plan Start trial topical NSAID Voltaren (Diclofenac) gel TID PRN - will try to get authorized since cannot take NSAID due to s/p gastric bypass surgery and CKD

## 2017-12-22 DIAGNOSIS — H35352 Cystoid macular degeneration, left eye: Secondary | ICD-10-CM | POA: Diagnosis not present

## 2018-01-05 DIAGNOSIS — J439 Emphysema, unspecified: Secondary | ICD-10-CM | POA: Diagnosis not present

## 2018-01-05 DIAGNOSIS — R0609 Other forms of dyspnea: Secondary | ICD-10-CM | POA: Diagnosis not present

## 2018-02-05 ENCOUNTER — Other Ambulatory Visit: Payer: Self-pay

## 2018-02-05 DIAGNOSIS — F5104 Psychophysiologic insomnia: Secondary | ICD-10-CM

## 2018-02-05 DIAGNOSIS — M150 Primary generalized (osteo)arthritis: Secondary | ICD-10-CM

## 2018-02-05 DIAGNOSIS — M159 Polyosteoarthritis, unspecified: Secondary | ICD-10-CM

## 2018-02-05 DIAGNOSIS — M19041 Primary osteoarthritis, right hand: Secondary | ICD-10-CM

## 2018-02-05 DIAGNOSIS — E1121 Type 2 diabetes mellitus with diabetic nephropathy: Secondary | ICD-10-CM

## 2018-02-05 DIAGNOSIS — M19042 Primary osteoarthritis, left hand: Secondary | ICD-10-CM

## 2018-02-05 DIAGNOSIS — M8949 Other hypertrophic osteoarthropathy, multiple sites: Secondary | ICD-10-CM

## 2018-02-05 DIAGNOSIS — F331 Major depressive disorder, recurrent, moderate: Secondary | ICD-10-CM

## 2018-02-05 DIAGNOSIS — E785 Hyperlipidemia, unspecified: Principal | ICD-10-CM

## 2018-02-05 DIAGNOSIS — E1169 Type 2 diabetes mellitus with other specified complication: Secondary | ICD-10-CM

## 2018-02-06 ENCOUNTER — Other Ambulatory Visit: Payer: Medicare Other

## 2018-02-06 DIAGNOSIS — E785 Hyperlipidemia, unspecified: Secondary | ICD-10-CM | POA: Diagnosis not present

## 2018-02-06 DIAGNOSIS — E1121 Type 2 diabetes mellitus with diabetic nephropathy: Secondary | ICD-10-CM | POA: Diagnosis not present

## 2018-02-06 DIAGNOSIS — E1169 Type 2 diabetes mellitus with other specified complication: Secondary | ICD-10-CM | POA: Diagnosis not present

## 2018-02-07 LAB — CBC WITH DIFFERENTIAL/PLATELET
Absolute Monocytes: 447 cells/uL (ref 200–950)
Basophils Absolute: 41 cells/uL (ref 0–200)
Basophils Relative: 0.7 %
Eosinophils Absolute: 122 cells/uL (ref 15–500)
Eosinophils Relative: 2.1 %
HCT: 41.9 % (ref 35.0–45.0)
Hemoglobin: 14.1 g/dL (ref 11.7–15.5)
Lymphs Abs: 2158 cells/uL (ref 850–3900)
MCH: 31.9 pg (ref 27.0–33.0)
MCHC: 33.7 g/dL (ref 32.0–36.0)
MCV: 94.8 fL (ref 80.0–100.0)
MPV: 12 fL (ref 7.5–12.5)
Monocytes Relative: 7.7 %
Neutro Abs: 3033 cells/uL (ref 1500–7800)
Neutrophils Relative %: 52.3 %
Platelets: 206 10*3/uL (ref 140–400)
RBC: 4.42 10*6/uL (ref 3.80–5.10)
RDW: 11.7 % (ref 11.0–15.0)
Total Lymphocyte: 37.2 %
WBC: 5.8 10*3/uL (ref 3.8–10.8)

## 2018-02-07 LAB — HEMOGLOBIN A1C
Hgb A1c MFr Bld: 6.2 % of total Hgb — ABNORMAL HIGH (ref <5.7)
Mean Plasma Glucose: 131 (calc)
eAG (mmol/L): 7.3 (calc)

## 2018-02-07 LAB — T4, FREE: Free T4: 1 ng/dL (ref 0.8–1.8)

## 2018-02-07 LAB — COMPLETE METABOLIC PANEL WITH GFR
AG Ratio: 1.7 (calc) (ref 1.0–2.5)
ALT: 24 U/L (ref 6–29)
Albumin: 3.9 g/dL (ref 3.6–5.1)
Alkaline phosphatase (APISO): 85 U/L (ref 33–130)
BUN/Creatinine Ratio: 22 (calc) (ref 6–22)
BUN: 21 mg/dL (ref 7–25)
CO2: 28 mmol/L (ref 20–32)
Calcium: 8.9 mg/dL (ref 8.6–10.4)
Chloride: 103 mmol/L (ref 98–110)
Creat: 0.94 mg/dL — ABNORMAL HIGH (ref 0.60–0.88)
GFR, Est African American: 66 mL/min/{1.73_m2} (ref >=60)
GFR, Est Non African American: 57 mL/min/{1.73_m2} — ABNORMAL LOW (ref >=60)
Globulin: 2.3 g/dL (calc) (ref 1.9–3.7)
Glucose, Bld: 154 mg/dL — ABNORMAL HIGH (ref 65–99)
Sodium: 139 mmol/L (ref 135–146)
Total Bilirubin: 0.6 mg/dL (ref 0.2–1.2)
Total Protein: 6.2 g/dL (ref 6.1–8.1)

## 2018-02-07 LAB — LIPID PANEL
Cholesterol: 150 mg/dL (ref <200)
HDL: 68 mg/dL (ref >50)
LDL Cholesterol (Calc): 61 mg/dL (calc)
Non-HDL Cholesterol (Calc): 82 mg/dL (calc) (ref <130)
Total CHOL/HDL Ratio: 2.2 (calc) (ref <5.0)
Triglycerides: 131 mg/dL (ref <150)

## 2018-02-07 LAB — TSH: TSH: 4.93 mIU/L — ABNORMAL HIGH (ref 0.40–4.50)

## 2018-02-13 ENCOUNTER — Encounter: Payer: Self-pay | Admitting: Family Medicine

## 2018-02-13 ENCOUNTER — Ambulatory Visit (INDEPENDENT_AMBULATORY_CARE_PROVIDER_SITE_OTHER): Payer: Medicare Other | Admitting: Family Medicine

## 2018-02-13 VITALS — BP 135/53 | HR 66 | Temp 98.0°F | Resp 16 | Ht 63.0 in | Wt 210.0 lb

## 2018-02-13 DIAGNOSIS — R002 Palpitations: Secondary | ICD-10-CM | POA: Diagnosis not present

## 2018-02-13 DIAGNOSIS — E1121 Type 2 diabetes mellitus with diabetic nephropathy: Secondary | ICD-10-CM

## 2018-02-13 DIAGNOSIS — E1169 Type 2 diabetes mellitus with other specified complication: Secondary | ICD-10-CM

## 2018-02-13 DIAGNOSIS — E785 Hyperlipidemia, unspecified: Secondary | ICD-10-CM

## 2018-02-13 DIAGNOSIS — M85852 Other specified disorders of bone density and structure, left thigh: Secondary | ICD-10-CM

## 2018-02-13 DIAGNOSIS — R600 Localized edema: Secondary | ICD-10-CM

## 2018-02-13 DIAGNOSIS — F331 Major depressive disorder, recurrent, moderate: Secondary | ICD-10-CM | POA: Diagnosis not present

## 2018-02-13 DIAGNOSIS — J432 Centrilobular emphysema: Secondary | ICD-10-CM

## 2018-02-13 MED ORDER — CITALOPRAM HYDROBROMIDE 40 MG PO TABS: 40.0000 mg | ORAL_TABLET | Freq: Every day | ORAL | 1 refills | Status: DC

## 2018-02-13 MED ORDER — SIMVASTATIN 40 MG PO TABS: 40.0000 mg | ORAL_TABLET | Freq: Every day | ORAL | 1 refills | Status: DC

## 2018-02-13 MED ORDER — FUROSEMIDE 20 MG PO TABS: 20.0000 mg | ORAL_TABLET | Freq: Every day | ORAL | 1 refills | Status: DC

## 2018-02-13 MED ORDER — ALENDRONATE SODIUM 70 MG PO TABS: 70.0000 mg | ORAL_TABLET | ORAL | 1 refills | Status: DC

## 2018-02-13 NOTE — Patient Instructions (Addendum)
Thank you for coming to the office today.  Refilled medicines, 90 day supply  1. Chemistry - Improved kidney function creatinine down to 0.94 from 1.26. Blood sugar is elevated. Normal electrolytes (except potassium was not completed due to blood sample problem), and liver function.  2. Hemoglobin A1c (Diabetes) - 6.2, well controlled, stable from last time 6.1  3. TSH Thyroid Function Tests - Borderline elevated TSH, not significant enough to diagnose or treat. Free T4 is normal.  4. CBC Blood Counts - Normal, no anemia, no other significant abnormality  5. Cholesterol - Normal cholesterol results. Controlled on Omega 3 and Simvastatin.  Next visit Not fasting - Diabetic check up  Please schedule a Follow-up Appointment to: Return in about 5 months (around 07/22/2018) for DM A1c.  If you have any other questions or concerns, please feel free to call the office or send a message through [facility]. You may also schedule an earlier appointment if necessary.  Additionally, you may be receiving a survey about your experience at our office within a few days to 1 week by e-mail or mail. We value your feedback.  Nobie Putnam, DO [facility]

## 2018-02-13 NOTE — Progress Notes (Signed)
Subjective:    Patient ID: Jenna [name], female    DOB: 04/07/1935, 83 y.o.   MRN: [id number]  Jenna [name] is a 83 y.o. female presenting on 02/13/2018 for Diabetes (follow up btw felt palpitation felt only Sunday for few minutes )  Accompanied by husband, Zenaida Niece, who provides additional history.  HPI   Insomnia / Chronic Depression, recurrent / Anxiety Reviewed last visit with background on depression. She now reports primary life stressor involves her daughter lives with her and her husband. Causing financial stress among other issues. - Currently doing fairly well on current dose Citalopram 40mg  daily, needs refill Admits some issues with insomnia still - taking Trazodone PRN  CHRONIC DM, Type 2, w/ nephropathy / CKD-III / Hyperlipidemia in DM Last lab result A1c 6.2 with improvement Meds: None Currently not on ACEi / ARB On Statin, simvastatin 40mg  daily now tolerating well. Last lipid controlled. Scheduled for [facility] exam next week - will request report Denies hypoglycemia, polyuria, visual changes, numbness or tingling.  Palpitations, episodic Reports new complaint. Request to return to Cardiology Dr Rockey Situ, last saw him in 2015 for pericardial effusion. Now - reports most severe episode. She was resting, thinks she was getting ready for [facility], not exerting herself, not stand suddenly. BP 104/40s,HR 141 Prior history of near syncope and syncope in past. Denies any lightheadedness, dizziness, vertigo, chest pain, dyspnea  Centrilobular Emphysema History of former smoker. No recent COPD flare. Has albuterol PRN. No recent concerns.  Obesity BMI >37 Weight loss in 2010 - gastric bypass Currently doing well, down 7 lbs in 5 months.  Osteopenia L Hip Stable without problem. Request refill on Alendronate  Back spasm Additional complaint, episodes of upper back muscle spasms and knot, painful to touch at times, left upper, tried family member's  muscle relaxant with good results, she request short term supply PRN, call back to request muscle relaxant - does not know name.   Health Maintenance: She will return for dedicated Diabetes follow-up.  Depression screen [facility] 2/9 02/13/2018 11/13/2017  Decreased Interest 0 1  Down, Depressed, Hopeless 0 1  PHQ - 2 Score 0 2  Altered sleeping 2 3  Tired, decreased energy 2 2  Change in appetite 3 1  Feeling bad or failure about yourself  0 0  Trouble concentrating 0 0  Moving slowly or fidgety/restless 1 1  Suicidal thoughts 0 0  PHQ-9 Score 8 9  Difficult doing work/chores Not difficult at all Somewhat difficult    Social History   Tobacco Use  . Smoking status: Former Smoker    Packs/day: 1.00    Years: 20.00    Pack years: 20.00    Types: Cigarettes    Last attempt to quit: 1983    Years since quitting: 37.0  . Smokeless tobacco: Former Network engineer Use Topics  . Alcohol use: Not Currently    Comment: past  . Drug use: No    Review of Systems Per HPI unless specifically indicated above     Objective:    BP (!) 135/53   Pulse 66   Temp 98 F (36.7 C) (Oral)   Resp 16   Ht 5\' 3"  (1.6 m)   Wt 210 lb (95.3 kg)   BMI 37.20 kg/m   Wt Readings from Last 3 Encounters:  02/13/18 210 lb (95.3 kg)  11/12/17 209 lb (94.8 kg)  09/24/17 217 lb 6 oz (98.6 kg)    Physical Exam  Vitals signs and nursing note reviewed.  Constitutional:      General: She is not in acute distress.    Appearance: She is well-developed. She is not diaphoretic.     Comments: Well-appearing, comfortable, cooperative  HENT:     Head: Normocephalic and atraumatic.  Eyes:     General:        Right eye: No discharge.        Left eye: No discharge.     Conjunctiva/sclera: Conjunctivae normal.  Neck:     Musculoskeletal: Normal range of motion and neck supple.     Thyroid: No thyromegaly.  Cardiovascular:     Rate and Rhythm: Normal rate and regular rhythm.     Heart sounds: Normal  heart sounds. No murmur.     Comments: No ectopy heard Pulmonary:     Effort: Pulmonary effort is normal. No respiratory distress.     Breath sounds: Normal breath sounds. No wheezing or rales.  Musculoskeletal: Normal range of motion.  Lymphadenopathy:     Cervical: No cervical adenopathy.  Skin:    General: Skin is warm and dry.     Findings: No erythema or rash.  Neurological:     Mental Status: She is alert and oriented to person, place, and time.  Psychiatric:        Behavior: Behavior normal.     Comments: Well groomed, good eye contact, normal speech and thoughts    Results for orders placed or performed in visit on 02/05/18  T4, free  Result Value Ref Range   Free T4 1.0 0.8 - 1.8 ng/dL  TSH  Result Value Ref Range   TSH 4.93 (H) 0.40 - 4.50 mIU/L  Lipid panel  Result Value Ref Range   Cholesterol 150 <200 mg/dL   HDL 68 >50 mg/dL   Triglycerides 131 <150 mg/dL   LDL Cholesterol (Calc) 61 mg/dL (calc)   Total CHOL/HDL Ratio 2.2 <5.0 (calc)   Non-HDL Cholesterol (Calc) 82 <130 mg/dL (calc)  COMPLETE METABOLIC PANEL WITH GFR  Result Value Ref Range   Glucose, Bld 154 (H) 65 - 99 mg/dL   BUN 21 7 - 25 mg/dL   Creat 0.94 (H) 0.60 - 0.88 mg/dL   GFR, Est Non African American 57 (L) > OR = 60 mL/min/1.52m2   GFR, Est African American 66 > OR = 60 mL/min/1.86m2   BUN/Creatinine Ratio 22 6 - 22 (calc)   Sodium 139 135 - 146 mmol/L   Potassium CANCELED    Chloride 103 98 - 110 mmol/L   CO2 28 20 - 32 mmol/L   Calcium 8.9 8.6 - 10.4 mg/dL   Total Protein 6.2 6.1 - 8.1 g/dL   Albumin 3.9 3.6 - 5.1 g/dL   Globulin 2.3 1.9 - 3.7 g/dL (calc)   AG Ratio 1.7 1.0 - 2.5 (calc)   Total Bilirubin 0.6 0.2 - 1.2 mg/dL   Alkaline phosphatase (APISO) 85 33 - 130 U/L   AST CANCELED    ALT 24 6 - 29 U/L  CBC with Differential/Platelet  Result Value Ref Range   WBC 5.8 3.8 - 10.8 Thousand/uL   RBC 4.42 3.80 - 5.10 Million/uL   Hemoglobin 14.1 11.7 - 15.5 g/dL   HCT 41.9 35.0 -  45.0 %   MCV 94.8 80.0 - 100.0 fL   MCH 31.9 27.0 - 33.0 pg   MCHC 33.7 32.0 - 36.0 g/dL   RDW 11.7 11.0 - 15.0 %   Platelets 206 140 -  400 Thousand/uL   MPV 12.0 7.5 - 12.5 fL   Neutro Abs 3,033 1,500 - 7,800 cells/uL   Lymphs Abs 2,158 850 - 3,900 cells/uL   Absolute Monocytes 447 200 - 950 cells/uL   Eosinophils Absolute 122 15 - 500 cells/uL   Basophils Absolute 41 0 - 200 cells/uL   Neutrophils Relative % 52.3 %   Total Lymphocyte 37.2 %   Monocytes Relative 7.7 %   Eosinophils Relative 2.1 %   Basophils Relative 0.7 %  Hemoglobin A1c  Result Value Ref Range   Hgb A1c MFr Bld 6.2 (H) <5.7 % of total Hgb   Mean Plasma Glucose 131 (calc)   eAG (mmol/L) 7.3 (calc)      Assessment & Plan:   Problem List Items Addressed This Visit    Bilateral leg edema    Stable, refill Lasix 20      Relevant Medications   furosemide (LASIX) 20 MG tablet   Centrilobular emphysema (HCC)    Stable, without exacerbation Former smoker Currently using Albuterol PRN only      Controlled type 2 diabetes mellitus with diabetic nephropathy, without long-term current use of insulin ([place]) - Primary    Well controlled DM2 w/ A1c 6.2 No hypoglycemia Complications - CKD-III stable, other including hyperlipidemia, GERD, depression, obesity - increases risk of future cardiovascular complications / poor glucose control due to reduced lifestyle diet/exercise with low energy mood and fatigue  Plan:  1. Remain diet controlled, off med 2. Encourage improved lifestyle - low carb, low sugar diet, reduce portion size, continue improving regular exercise 3. Check CBG , bring log to next visit for review 4. Continue Statin = future consider low dose ACEi/ARB 5. Follow-up within 3 months for routine DM visit      Relevant Medications   simvastatin (ZOCOR) 40 MG tablet   Depression, major, recurrent (HCC)    Stable chronic problem with mood, anxiety, secondary insomnia Recently worse w/ life stressors  family related still  Plan Encourage continue on current med Citalopram 40mg  daily Continue Trazodone 50mg  tabs, start half to whole tab nightly for sleep and mood Follow-up sooner if needed  - may adjust trazodone dose in future if need      Relevant Medications   citalopram (CELEXA) 40 MG tablet   Hyperlipidemia associated with type 2 diabetes mellitus ([place])    Controlled cholesterol on statin and lifestyle Last lipid panel 01/2018  Plan: 1. Continue current meds - Simvastatin 40mg  daily 2. Encourage improved lifestyle - low carb/cholesterol, reduce portion size, continue improving regular exercise       Relevant Medications   simvastatin (ZOCOR) 40 MG tablet    Other Visit Diagnoses    Intermittent palpitations      Currently asymptomatic. Seems episode recently palpitations, other concerns, with low BP and elevated HR, discussed concern may be orthostatic response, seems no position change or other triggering event, usually occurs at rest. Agree with return to Cardiology for 2nd opinion - patient request to go back to Dr Rockey Situ    Relevant Orders   Ambulatory referral to Cardiology   Osteopenia of left hip       Relevant Medications   alendronate (FOSAMAX) 70 MG tablet      Meds ordered this encounter  Medications  . furosemide (LASIX) 20 MG tablet    Sig: Take 1 tablet (20 mg total) by mouth daily.    Dispense:  90 tablet    Refill:  1  . citalopram (CELEXA)  40 MG tablet    Sig: Take 1 tablet (40 mg total) by mouth daily.    Dispense:  90 tablet    Refill:  1  . simvastatin (ZOCOR) 40 MG tablet    Sig: Take 1 tablet (40 mg total) by mouth daily.    Dispense:  90 tablet    Refill:  1  . alendronate (FOSAMAX) 70 MG tablet    Sig: Take 1 tablet (70 mg total) by mouth once a week.    Dispense:  12 tablet    Refill:  1   Orders Placed This Encounter  Procedures  . Ambulatory referral to Cardiology    Referral Priority:   Routine    Referral Type:    Consultation    Referral Reason:   Specialty Services Required    Referred to Provider:   Minna Merritts, MD    Requested Specialty:   Cardiology    Number of Visits Requested:   1     Follow up plan: Return in about 5 months (around 07/22/2018) for DM A1c.  Nobie Putnam, [facility] 02/13/2018, 2:58 PM

## 2018-02-14 ENCOUNTER — Encounter: Payer: Self-pay | Admitting: Family Medicine

## 2018-02-14 NOTE — Assessment & Plan Note (Addendum)
Stable, without exacerbation Former smoker Currently using Albuterol PRN only

## 2018-02-14 NOTE — Assessment & Plan Note (Signed)
Stable chronic problem with mood, anxiety, secondary insomnia Recently worse w/ life stressors family related still  Plan Encourage continue on current med Citalopram 40mg  daily Continue Trazodone 50mg  tabs, start half to whole tab nightly for sleep and mood Follow-up sooner if needed  - may adjust trazodone dose in future if need

## 2018-02-14 NOTE — Assessment & Plan Note (Signed)
Stable, refill Lasix 20

## 2018-02-14 NOTE — Assessment & Plan Note (Signed)
Well controlled DM2 w/ A1c 6.2 No hypoglycemia Complications - CKD-III stable, other including hyperlipidemia, GERD, depression, obesity - increases risk of future cardiovascular complications / poor glucose control due to reduced lifestyle diet/exercise with low energy mood and fatigue  Plan:  1. Remain diet controlled, off med 2. Encourage improved lifestyle - low carb, low sugar diet, reduce portion size, continue improving regular exercise 3. Check CBG , bring log to next visit for review 4. Continue Statin = future consider low dose ACEi/ARB 5. Follow-up within 3 months for routine DM visit

## 2018-02-14 NOTE — Assessment & Plan Note (Signed)
Controlled cholesterol on statin and lifestyle Last lipid panel 01/2018  Plan: 1. Continue current meds - Simvastatin 40mg  daily 2. Encourage improved lifestyle - low carb/cholesterol, reduce portion size, continue improving regular exercise

## 2018-02-16 ENCOUNTER — Telehealth: Payer: Self-pay | Admitting: Family Medicine

## 2018-02-16 DIAGNOSIS — M19041 Primary osteoarthritis, right hand: Secondary | ICD-10-CM

## 2018-02-16 DIAGNOSIS — M8949 Other hypertrophic osteoarthropathy, multiple sites: Secondary | ICD-10-CM

## 2018-02-16 DIAGNOSIS — M159 Polyosteoarthritis, unspecified: Secondary | ICD-10-CM

## 2018-02-16 DIAGNOSIS — M150 Primary generalized (osteo)arthritis: Principal | ICD-10-CM

## 2018-02-16 DIAGNOSIS — M19042 Primary osteoarthritis, left hand: Secondary | ICD-10-CM

## 2018-02-16 MED ORDER — METHOCARBAMOL 500 MG PO TABS: 500.0000 mg | ORAL_TABLET | Freq: Three times a day (TID) | ORAL | 1 refills | Status: DC | PRN

## 2018-02-16 NOTE — Telephone Encounter (Signed)
Pt daughter called  Requesting a prescription for methocarbamol 500 mg. PT daughter said the all medication need to go to [facility] in [place]

## 2018-02-16 NOTE — Telephone Encounter (Signed)
Sent refill. Last visit 1/24 we discussed this, she was to call back with this name of med.  Nobie Putnam, [facility] 02/16/2018, 5:15 PM

## 2018-02-17 ENCOUNTER — Telehealth: Payer: Self-pay | Admitting: Family Medicine

## 2018-02-17 NOTE — Telephone Encounter (Signed)
Done

## 2018-02-17 NOTE — Telephone Encounter (Signed)
Pt had prescription transferred to [facility] but please change in chart for future refills.

## 2018-02-23 DIAGNOSIS — H35371 Puckering of macula, right eye: Secondary | ICD-10-CM | POA: Diagnosis not present

## 2018-02-23 DIAGNOSIS — H35352 Cystoid macular degeneration, left eye: Secondary | ICD-10-CM | POA: Diagnosis not present

## 2018-02-23 LAB — HM DIABETES EYE EXAM

## 2018-04-23 ENCOUNTER — Telehealth: Payer: Self-pay

## 2018-04-23 NOTE — Telephone Encounter (Signed)
Virtual Visit Pre-Appointment Phone Call  Steps For Call:  1. Confirm consent - "In the setting of the current Covid19 crisis, you are scheduled for a VIDEO visit with your provider on 05/19/2018 at 11:40AM.  Just as we do with many in-office visits, in order for you to participate in this visit, we must obtain consent.  If you'd like, I can send this to your mychart (if signed up) or email for you to review.  Otherwise, I can obtain your verbal consent now.  All virtual visits are billed to your insurance company just like a normal visit would be.  By agreeing to a virtual visit, we'd like you to understand that the technology does not allow for your provider to perform an examination, and thus may limit your provider's ability to fully assess your condition.  Finally, though the technology is pretty good, we cannot assure that it will always work on either your or our end, and in the setting of a video visit, we may have to convert it to a phone-only visit.  In either situation, we cannot ensure that we have a secure connection.  Are you willing to proceed?"  2. Give patient instructions for WebEx download to smartphone as below if video visit  3. Advise patient to be prepared with any vital sign or heart rhythm information, their current medicines, and a piece of paper and pen handy for any instructions they may receive the day of their visit  4. Inform patient they will receive a phone call 15 minutes prior to their appointment time (may be from unknown caller ID) so they should be prepared to answer  5. Confirm that appointment type is correct in Epic appointment notes (video vs telephone)    TELEPHONE CALL NOTE  Jenna [name] has been deemed a candidate for a follow-up tele-health visit to limit community exposure during the Covid-19 pandemic. I spoke with the patient via phone to ensure availability of phone/video source, confirm preferred email & phone number, and discuss  instructions and expectations.  I reminded Jenna [name] to be prepared with any vital sign and/or heart rhythm information that could potentially be obtained via home monitoring, at the time of her visit. I reminded Jenna [name] to expect a phone call at the time of her visit if her visit.  Did the patient verbally acknowledge consent to treatment? YES  Jenna [name], [place] 04/23/2018 11:15 AM   CONSENT FOR TELE-HEALTH VISIT - PLEASE REVIEW  I hereby voluntarily request, consent and authorize [facility] and its employed or contracted physicians, physician assistants, nurse practitioners or other licensed health care professionals (the Practitioner), to provide me with telemedicine health care services (the Services") as deemed necessary by the treating Practitioner. I acknowledge and consent to receive the Services by the Practitioner via telemedicine. I understand that the telemedicine visit will involve communicating with the Practitioner through live audiovisual communication technology and the disclosure of certain medical information by electronic transmission. I acknowledge that I have been given the opportunity to request an in-person assessment or other available alternative prior to the telemedicine visit and am voluntarily participating in the telemedicine visit.  I understand that I have the right to withhold or withdraw my consent to the use of telemedicine in the course of my care at any time, without affecting my right to future care or treatment, and that the Practitioner or I may terminate the telemedicine visit at any time. I understand that I  have the right to inspect all information obtained and/or recorded in the course of the telemedicine visit and may receive copies of available information for a reasonable fee.  I understand that some of the potential risks of receiving the Services via telemedicine include:   Delay or interruption in medical evaluation due to  technological equipment failure or disruption;  Information transmitted may not be sufficient (e.g. poor resolution of images) to allow for appropriate medical decision making by the Practitioner; and/or   In rare instances, security protocols could fail, causing a breach of personal health information.  Furthermore, I acknowledge that it is my responsibility to provide information about my medical history, conditions and care that is complete and accurate to the best of my ability. I acknowledge that Practitioner's advice, recommendations, and/or decision may be based on factors not within their control, such as incomplete or inaccurate data provided by me or distortions of diagnostic images or specimens that may result from electronic transmissions. I understand that the practice of medicine is not an exact science and that Practitioner makes no warranties or guarantees regarding treatment outcomes. I acknowledge that I will receive a copy of this consent concurrently upon execution via email to the email address I last provided but may also request a printed copy by calling the office of [facility].    I understand that my insurance will be billed for this visit.   I have read or had this consent read to me.  I understand the contents of this consent, which adequately explains the benefits and risks of the Services being provided via telemedicine.   I have been provided ample opportunity to ask questions regarding this consent and the Services and have had my questions answered to my satisfaction.  I give my informed consent for the services to be provided through the use of telemedicine in my medical care  By participating in this telemedicine visit I agree to the above.

## 2018-05-18 NOTE — Progress Notes (Signed)
Virtual Visit via Video Note   This visit type was conducted due to national recommendations for restrictions regarding the COVID-19 Pandemic (e.g. social distancing) in an effort to limit this patient's exposure and mitigate transmission in our community.  Due to her co-morbid illnesses, this patient is at least at moderate risk for complications without adequate follow up.  This format is felt to be most appropriate for this patient at this time.  All issues noted in this document were discussed and addressed.  A limited physical exam was performed with this format.  Please refer to the patient's chart for her consent to telehealth for [name].   I connected with  Ettie Krontz Evola on 76/16/07 by a video enabled telemedicine application and verified that I am speaking with the correct person using two identifiers. I discussed the limitations of evaluation and management by telemedicine. The patient expressed understanding and agreed to proceed.   Evaluation Performed:  Follow-up visit  Date:  3/71/0626   ID:  Jenna [name], DOB [birth year], MRN [id number]  Patient Location:  [place] [address]   Provider location:   [facility], [place] office  PCP:  Olin Hauser, DO  Cardiologist:  Patsy Baltimore   Chief Complaint: Paroxysmal tachycardia New patient  History of Present Illness:    Jenna [name] is a 83 y.o. female who presents via audio/video conferencing for a telehealth visit today.   The patient does not symptoms concerning for COVID-19 infection (fever, chills, cough, or new SHORTNESS OF BREATH).   Patient has a past medical history of obesity,  Hypertension, hyperlipidemia,  GERD,  chronic cough,  gastric bypass surgery,  depression,   chronic leg edema  Type 2 diabetes hemoglobin A1c 6.2 COPD Former smoker Stage III chronic kidney disease Stable chronic problem with mood, anxiety,  secondary insomnia presents for evaluation of pericardial effusion seen on CT scan.  Referred by primary care for palpitations Concern for low blood pressure, elevated heart rate  arrhythmis started 2-3 years Quivering "a lot", jello Pulse 170 5 min episode Then did it again another time, 10 min Can feel it,  Having some less than one minute Arrhythmia seems to be lasting a little bit longer now, never is to go 5 to 10 minutes  Otherwise at baseline very active, denies any chest pain, shortness of breath Denies any coughing, stable minimal lower extremity edema Weight has been stable especially following gastric bypass surgery  Lab work reviewed with her showing close to normal renal function, mildly elevated glucose levels, Total cholesterol 150 Hemoglobin A1c low 6 range  Other past medical history reviewed Prior history of chronic cough. Her other big complaint didn't GERD symptoms. Several other family members have GERD as well. She has had chronic lower extremity edema, prior trauma to her left lower extremity. She takes Lasix 20 mg daily with no significant improvement    Prior CV studies:   The following studies were reviewed today:    Past Medical History:  Diagnosis Date   Anxiety    Arthritis    neck, knees(before replacements)   Asthma    COPD (chronic obstructive pulmonary disease) ([place])    Encounter for colonoscopy due to history of adenomatous colonic polyps    GERD (gastroesophageal reflux disease)    Hearing loss of both ears    Hyperlipidemia    Osteoporosis    Palpitations    Sleep apnea  resolved with gastric bypass   Syncope and collapse    Urine incontinence    Past Surgical History:  Procedure Laterality Date   BROW LIFT Bilateral 06/27/2015   Procedure: BLEPHAROPLASTY BILATERAL UPPER EYELIDS BILATERAL BLEPHAROTOSIS EYELIDS;  Surgeon: Karle Starch, MD;  Location: [place];  Service: Ophthalmology;  Laterality:  Bilateral;  BILATERAL   CATARACT EXTRACTION W/PHACO Right 02/13/2015   Procedure: CATARACT EXTRACTION PHACO AND INTRAOCULAR LENS PLACEMENT (IOC);  Surgeon: Ronnell Freshwater, MD;  Location: [place];  Service: Ophthalmology;  Laterality: Right;   CATARACT EXTRACTION W/PHACO Left 03/22/2015   Procedure: CATARACT EXTRACTION PHACO AND INTRAOCULAR LENS PLACEMENT (IOC);  Surgeon: Ronnell Freshwater, MD;  Location: [place];  Service: Ophthalmology;  Laterality: Left;  [place]   COLECTOMY     COSMETIC SURGERY  2012   tummy tuck and excess skin removal   GASTRIC BYPASS  2010   REPLACEMENT TOTAL KNEE BILATERAL  1998   SHOULDER SURGERY  2009   left    TONSILLECTOMY     TOTAL ABDOMINAL HYSTERECTOMY  1979     Current Meds  Medication Sig   albuterol (PROVENTIL HFA;VENTOLIN HFA) 108 (90 BASE) MCG/ACT inhaler Inhale 2 puffs into the lungs every 6 (six) hours as needed for wheezing or shortness of breath.   alendronate (FOSAMAX) 70 MG tablet Take 1 tablet (70 mg total) by mouth once a week.   Ascorbic Acid (VITAMIN C) 1000 MG tablet Take 500 mg by mouth daily.    Cholecalciferol (VITAMIN D-1000 MAX ST) 1000 UNITS tablet Take 1,000 Units by mouth daily.    citalopram (CELEXA) 40 MG tablet Take 1 tablet (40 mg total) by mouth daily.   Cyanocobalamin (RA VITAMIN B-12 TR) 1000 MCG TBCR Take 1,000 mcg by mouth daily.    diclofenac sodium (VOLTAREN) 1 % GEL Apply 2 g topically 3 (three) times daily as needed. For hands, arthritis   ferrous sulfate 325 (65 FE) MG EC tablet Take 325 mg by mouth at bedtime.    furosemide (LASIX) 20 MG tablet Take 1 tablet (20 mg total) by mouth daily.   ipratropium (ATROVENT) 0.02 % nebulizer solution Take 0.5 mg by nebulization 4 (four) times daily.   methocarbamol (ROBAXIN) 500 MG tablet Take 1 tablet (500 mg total) by mouth every 8 (eight) hours as needed for muscle spasms.   Multiple  Vitamins-Minerals (ONE-A-DAY WOMENS PETITES) TABS Take 2 tablets by mouth every morning.   Omega-3 Fatty Acids (FISH OIL) 1000 MG CAPS Take 1,000 mg by mouth daily.   Pediatric Multiple Vit-C-FA (CHEWABLE VITE CHILDRENS) CHEW Chew by mouth.   simvastatin (ZOCOR) 40 MG tablet Take 1 tablet (40 mg total) by mouth daily.   traZODone (DESYREL) 50 MG tablet Take 0.5-1 tablets (25-50 mg total) by mouth at bedtime as needed for sleep.     Allergies:   Azithromycin; Codeine; Hydromorphone; Morphine; Nsaids; Oxycodone; Oxycodone-acetaminophen; Nabumetone; and Promethazine hcl   Social History   Tobacco Use   Smoking status: Former Smoker    Packs/day: 1.00    Years: 20.00    Pack years: 20.00    Types: Cigarettes    Last attempt to quit: 1983    Years since quitting: 37.3   Smokeless tobacco: Former Systems developer  Substance Use Topics   Alcohol use: Not Currently    Comment: past   Drug use: No     Current Outpatient Medications on File Prior to Visit  Medication Sig  Dispense Refill   albuterol (PROVENTIL HFA;VENTOLIN HFA) 108 (90 BASE) MCG/ACT inhaler Inhale 2 puffs into the lungs every 6 (six) hours as needed for wheezing or shortness of breath. 1 Inhaler 2   alendronate (FOSAMAX) 70 MG tablet Take 1 tablet (70 mg total) by mouth once a week. 12 tablet 1   Ascorbic Acid (VITAMIN C) 1000 MG tablet Take 500 mg by mouth daily.      Cholecalciferol (VITAMIN D-1000 MAX ST) 1000 UNITS tablet Take 1,000 Units by mouth daily.      citalopram (CELEXA) 40 MG tablet Take 1 tablet (40 mg total) by mouth daily. 90 tablet 1   Cyanocobalamin (RA VITAMIN B-12 TR) 1000 MCG TBCR Take 1,000 mcg by mouth daily.      diclofenac sodium (VOLTAREN) 1 % GEL Apply 2 g topically 3 (three) times daily as needed. For hands, arthritis 100 g 2   ferrous sulfate 325 (65 FE) MG EC tablet Take 325 mg by mouth at bedtime.      furosemide (LASIX) 20 MG tablet Take 1 tablet (20 mg total) by mouth daily. 90 tablet 1     ipratropium (ATROVENT) 0.02 % nebulizer solution Take 0.5 mg by nebulization 4 (four) times daily.     methocarbamol (ROBAXIN) 500 MG tablet Take 1 tablet (500 mg total) by mouth every 8 (eight) hours as needed for muscle spasms. 30 tablet 1   Multiple Vitamins-Minerals (ONE-A-DAY WOMENS PETITES) TABS Take 2 tablets by mouth every morning.     Omega-3 Fatty Acids (FISH OIL) 1000 MG CAPS Take 1,000 mg by mouth daily.     Pediatric Multiple Vit-C-FA (CHEWABLE VITE CHILDRENS) CHEW Chew by mouth.     simvastatin (ZOCOR) 40 MG tablet Take 1 tablet (40 mg total) by mouth daily. 90 tablet 1   traZODone (DESYREL) 50 MG tablet Take 0.5-1 tablets (25-50 mg total) by mouth at bedtime as needed for sleep. 30 tablet 2   ranitidine (ZANTAC) 150 MG tablet Take 1 tablet (150 mg total) by mouth 2 (two) times daily.     No current facility-administered medications on file prior to visit.      Family Hx: The patient's family history includes Colon cancer in her sister; Diabetes type II in her father and mother; Heart attack in her brother and mother; Heart attack (age of onset: 18) in her sister; Hyperlipidemia in her sister; Hypertension in her mother and sister; Ovarian cancer in her sister; Pneumonia in her father; Skin cancer in her father.  ROS:   Please see the history of present illness.    Review of Systems  Constitutional: Negative.   Respiratory: Negative.   Cardiovascular: Positive for palpitations.       Paroxysmal tachycardia  Gastrointestinal: Negative.   Musculoskeletal: Negative.   Neurological: Negative.   Psychiatric/Behavioral: Negative.   All other systems reviewed and are negative.    Labs/Other Tests and Data Reviewed:    Recent Labs: 02/06/2018: ALT 24; BUN 21; Creat 0.94; Hemoglobin 14.1; Platelets 206; Potassium CANCELED; Sodium 139; TSH 4.93   Recent Lipid Panel Lab Results  Component Value Date/Time   CHOL 150 02/06/2018 08:53 AM   TRIG 131 02/06/2018 08:53 AM    HDL 68 02/06/2018 08:53 AM   CHOLHDL 2.2 02/06/2018 08:53 AM   LDLCALC 61 02/06/2018 08:53 AM    Wt Readings from Last 3 Encounters:  02/13/18 210 lb (95.3 kg)  11/12/17 209 lb (94.8 kg)  09/24/17 217 lb 6 oz (98.6 kg)  Exam:    Vital Signs: Vital signs may also be detailed in the HPI There were no vitals taken for this visit.  Wt Readings from Last 3 Encounters:  02/13/18 210 lb (95.3 kg)  11/12/17 209 lb (94.8 kg)  09/24/17 217 lb 6 oz (98.6 kg)   Temp Readings from Last 3 Encounters:  02/13/18 98 F (36.7 C) (Oral)  11/12/17 98.2 F (36.8 C) (Oral)  09/24/17 98.4 F (36.9 C) (Oral)   BP Readings from Last 3 Encounters:  02/13/18 (!) 135/53  11/12/17 (!) 140/96  09/24/17 127/72   Pulse Readings from Last 3 Encounters:  02/13/18 66  11/12/17 74  09/24/17 71    103-120/65 Pulse 60   Well nourished, well developed female in no acute distress. Constitutional:  oriented to person, place, and time. No distress.  Head: Normocephalic and atraumatic.  Eyes:  no discharge. No scleral icterus.  Neck: Normal range of motion. Neck supple.  Pulmonary/Chest: No audible wheezing, no distress, appears comfortable Musculoskeletal: Normal range of motion.  no  tenderness or deformity.  Neurological:   Coordination normal. Full exam not performed Skin:  No rash Psychiatric:  normal mood and affect. behavior is normal. Thought content normal.    ASSESSMENT & PLAN:    Paroxysmal tachycardia (HCC) Unclear if she is having arrhythmia such as atrial fibrillation or flutter Unable to exclude atrial tachycardia Recommended a extended monitor for at least 2 weeks to rule out atrial fibrillation She is becoming more symptomatic now symptoms lasting 5 to 10 minutes at a time  Centrilobular emphysema ([place]) Reports her breathing is stable, not on oxygen Remote history of smoking Uses inhaler Does not feel inhalers contributing to her arrhythmia  Controlled type 2 diabetes  mellitus with diabetic nephropathy, without long-term current use of insulin (HCC) Hemoglobin A1c numbers well controlled  Hyperlipidemia associated with type 2 diabetes mellitus ([place]) Cholesterol is at goal on the current lipid regimen. No changes to the medications were made.  Essential hypertension Blood pressure is well controlled on today's visit. No changes made to the medications.  Smoker Stopped smoking many years ago Still with residual COPD  Bilateral leg edema Stable lower extremity edema Reports that she feels euvolemic  Chronic cough Reports cough has resolved  No further work-up needed    COVID-19 Education: The signs and symptoms of COVID-19 were discussed with the patient and how to seek care for testing (follow up with PCP or arrange E-visit).  The importance of social distancing was discussed today.  Patient Risk:   After full review of this patients clinical status, I feel that they are at least moderate risk at this time.  Time:   Today, I have spent 60 minutes with the patient with telehealth technology discussing the cardiac and medical problems/diagnoses detailed above   10 min spent reviewing the chart prior to patient visit today   Medication Adjustments/Labs and Tests Ordered: Current medicines are reviewed at length with the patient today.  Concerns regarding medicines are outlined above.   Tests Ordered: No tests ordered   Medication Changes: No changes made   Disposition: Follow-up in 6 months   Signed, Ida Rogue, MD  05/19/2018 12:22 PM    [place] Office [address] [place] [address], [place], [place] [address]

## 2018-05-19 ENCOUNTER — Telehealth (INDEPENDENT_AMBULATORY_CARE_PROVIDER_SITE_OTHER): Payer: Medicare Other | Admitting: Cardiovascular Disease

## 2018-05-19 ENCOUNTER — Encounter

## 2018-05-19 ENCOUNTER — Telehealth: Payer: Self-pay | Admitting: Cardiovascular Disease

## 2018-05-19 ENCOUNTER — Other Ambulatory Visit: Payer: Self-pay

## 2018-05-19 DIAGNOSIS — I479 Paroxysmal tachycardia, unspecified: Secondary | ICD-10-CM

## 2018-05-19 DIAGNOSIS — R05 Cough: Secondary | ICD-10-CM

## 2018-05-19 DIAGNOSIS — R600 Localized edema: Secondary | ICD-10-CM

## 2018-05-19 DIAGNOSIS — J432 Centrilobular emphysema: Secondary | ICD-10-CM | POA: Diagnosis not present

## 2018-05-19 DIAGNOSIS — R002 Palpitations: Secondary | ICD-10-CM

## 2018-05-19 DIAGNOSIS — F17200 Nicotine dependence, unspecified, uncomplicated: Secondary | ICD-10-CM

## 2018-05-19 DIAGNOSIS — R053 Chronic cough: Secondary | ICD-10-CM

## 2018-05-19 DIAGNOSIS — E785 Hyperlipidemia, unspecified: Secondary | ICD-10-CM

## 2018-05-19 DIAGNOSIS — I10 Essential (primary) hypertension: Secondary | ICD-10-CM

## 2018-05-19 DIAGNOSIS — E1121 Type 2 diabetes mellitus with diabetic nephropathy: Secondary | ICD-10-CM

## 2018-05-19 DIAGNOSIS — E1169 Type 2 diabetes mellitus with other specified complication: Secondary | ICD-10-CM

## 2018-05-19 NOTE — Addendum Note (Signed)
Addended by: Valora Corporal on: 05/19/2018 03:27 PM   Modules accepted: Orders

## 2018-05-19 NOTE — Telephone Encounter (Signed)
Spoke with patient and reviewed monitor and instructions for wearing and contact numbers if she should have any questions. She verbalized understanding with no further questions at this time.

## 2018-05-19 NOTE — Telephone Encounter (Signed)
Left voicemail message that I am trying to call to review recommendations. Advised that I would call her back later.

## 2018-05-19 NOTE — Telephone Encounter (Signed)
Spoke with patients emergency contact and she is going to reach out to her mother to have her answer call.

## 2018-05-19 NOTE — Patient Instructions (Addendum)
Medication Instructions:  No changes  If you need a refill on your cardiac medications before your next appointment, please call your pharmacy.   Lab work: No new labs needed   If you have labs (blood work) drawn today and your tests are completely normal, you will receive your results only by: Marland Kitchen MyChart Message (if you have MyChart) OR . A paper copy in the mail If you have any lab test that is abnormal or we need to change your treatment, we will call you to review the results.   Testing/Procedures: Your physician has recommended that you wear a Zio monitor. Zio monitors are medical devices that record the heart's electrical activity. Doctors most often [place] these monitors to diagnose arrhythmias. Arrhythmias are problems with the speed or rhythm of the heartbeat. The monitor is a small, portable device. You can wear one while you do your normal daily activities.   We will order a zio monitor For arrhythmia, paroxysmal tachycardia Possible atrial fibrillation  Avoid showering during the first 24 hours. Avoid excessive sweating to maximize wear time Do not submerge the patch in water (no tub baths, hot tubs, or pools) Keep lotion and oils away from the patch. Take brief showers with your back facing the shower head. Once done just pat dry.  Return monitor in box provided and place in mail box.  I will call you to review this information.   Follow-Up: At [facility], you and your health needs are our priority.  As part of our continuing mission to provide you with exceptional heart care, we have created designated Provider Care Teams.  These Care Teams include your primary Cardiologist (physician) and Advanced Practice Providers (APPs -  Physician Assistants and Nurse Practitioners) who all work together to provide you with the care you need, when you need it.  . You will need a follow up appointment in 6 months .   Please call our office 2 months in advance to schedule this  appointment.    . Providers on your designated Care Team:   . Murray Hodgkins, NP . Christell Faith, PA-C . Marrianne Mood, PA-C  Any Other Special Instructions Will Be Listed Below (If Applicable).  For educational health videos Log in to : [email or web address] Or : [email or web address], password : [email or web address]

## 2018-05-26 ENCOUNTER — Ambulatory Visit (INDEPENDENT_AMBULATORY_CARE_PROVIDER_SITE_OTHER): Payer: Medicare Other

## 2018-05-26 DIAGNOSIS — I479 Paroxysmal tachycardia, unspecified: Secondary | ICD-10-CM

## 2018-06-01 DIAGNOSIS — H35352 Cystoid macular degeneration, left eye: Secondary | ICD-10-CM | POA: Diagnosis not present

## 2018-06-16 DIAGNOSIS — I479 Paroxysmal tachycardia, unspecified: Secondary | ICD-10-CM | POA: Diagnosis not present

## 2018-06-17 ENCOUNTER — Other Ambulatory Visit: Payer: Self-pay

## 2018-06-26 DIAGNOSIS — F331 Major depressive disorder, recurrent, moderate: Secondary | ICD-10-CM

## 2018-06-26 DIAGNOSIS — J432 Centrilobular emphysema: Secondary | ICD-10-CM

## 2018-06-26 MED ORDER — ALBUTEROL SULFATE HFA 108 (90 BASE) MCG/ACT IN AERS: 2.0000 | INHALATION_SPRAY | Freq: Four times a day (QID) | RESPIRATORY_TRACT | 2 refills | Status: DC | PRN

## 2018-06-29 MED ORDER — ALBUTEROL SULFATE HFA 108 (90 BASE) MCG/ACT IN AERS: 2.0000 | INHALATION_SPRAY | Freq: Four times a day (QID) | RESPIRATORY_TRACT | 2 refills | Status: DC | PRN

## 2018-06-29 NOTE — Addendum Note (Signed)
Addended by: Olin Hauser on: 06/29/2018 01:54 PM   Modules accepted: Orders

## 2018-07-15 DIAGNOSIS — J439 Emphysema, unspecified: Secondary | ICD-10-CM | POA: Diagnosis not present

## 2018-07-15 DIAGNOSIS — R0600 Dyspnea, unspecified: Secondary | ICD-10-CM | POA: Diagnosis not present

## 2018-07-29 ENCOUNTER — Other Ambulatory Visit: Payer: Self-pay | Admitting: Family Medicine

## 2018-07-29 ENCOUNTER — Telehealth: Payer: Self-pay

## 2018-07-29 DIAGNOSIS — M85852 Other specified disorders of bone density and structure, left thigh: Secondary | ICD-10-CM

## 2018-07-29 NOTE — Telephone Encounter (Signed)
Patient advised she will come in the office for face to face appointment.

## 2018-07-29 NOTE — Telephone Encounter (Signed)
Patient has appointment on 08/25/18 for DM follow up.  Does she need labs before visit or just A1C at visit.  Please call patient with decision.

## 2018-07-29 NOTE — Telephone Encounter (Signed)
Reviewed her chart. She should only need A1c. Can do fingerstick A1c at visit.  Thanks  Nobie Putnam, [facility] 07/29/2018, 11:41 AM

## 2018-08-11 ENCOUNTER — Other Ambulatory Visit: Payer: Self-pay | Admitting: Family Medicine

## 2018-08-11 DIAGNOSIS — H35352 Cystoid macular degeneration, left eye: Secondary | ICD-10-CM | POA: Diagnosis not present

## 2018-08-11 DIAGNOSIS — M85852 Other specified disorders of bone density and structure, left thigh: Secondary | ICD-10-CM

## 2018-08-12 MED ORDER — ALENDRONATE SODIUM 70 MG PO TABS: 70.0000 mg | ORAL_TABLET | ORAL | 1 refills | Status: DC

## 2018-08-21 ENCOUNTER — Ambulatory Visit: Payer: Medicare Other | Admitting: Family Medicine

## 2018-08-25 ENCOUNTER — Ambulatory Visit (INDEPENDENT_AMBULATORY_CARE_PROVIDER_SITE_OTHER): Payer: Medicare Other | Admitting: Family Medicine

## 2018-08-25 ENCOUNTER — Encounter: Payer: Self-pay | Admitting: Family Medicine

## 2018-08-25 ENCOUNTER — Other Ambulatory Visit: Payer: Self-pay | Admitting: Family Medicine

## 2018-08-25 ENCOUNTER — Other Ambulatory Visit: Payer: Self-pay

## 2018-08-25 VITALS — BP 135/61 | HR 75 | Temp 98.5°F | Resp 16 | Ht 63.0 in | Wt 209.0 lb

## 2018-08-25 DIAGNOSIS — E669 Obesity, unspecified: Secondary | ICD-10-CM

## 2018-08-25 DIAGNOSIS — E1169 Type 2 diabetes mellitus with other specified complication: Secondary | ICD-10-CM

## 2018-08-25 DIAGNOSIS — F331 Major depressive disorder, recurrent, moderate: Secondary | ICD-10-CM

## 2018-08-25 DIAGNOSIS — R600 Localized edema: Secondary | ICD-10-CM | POA: Diagnosis not present

## 2018-08-25 DIAGNOSIS — R7989 Other specified abnormal findings of blood chemistry: Secondary | ICD-10-CM

## 2018-08-25 DIAGNOSIS — Z0000 Encounter for general adult medical examination without abnormal findings: Secondary | ICD-10-CM

## 2018-08-25 DIAGNOSIS — F5104 Psychophysiologic insomnia: Secondary | ICD-10-CM

## 2018-08-25 DIAGNOSIS — E1121 Type 2 diabetes mellitus with diabetic nephropathy: Secondary | ICD-10-CM | POA: Diagnosis not present

## 2018-08-25 LAB — POCT GLYCOSYLATED HEMOGLOBIN (HGB A1C): Hemoglobin A1C: 6.4 % — AB (ref 4.0–5.6)

## 2018-08-25 LAB — POCT UA - MICROALBUMIN: Microalbumin Ur, POC: 0 mg/L

## 2018-08-25 MED ORDER — TRAZODONE HCL 100 MG PO TABS: 100.0000 mg | ORAL_TABLET | Freq: Every day | ORAL | 2 refills | Status: DC

## 2018-08-25 NOTE — Patient Instructions (Addendum)
Thank you for coming to the office today.  Referral sent to therapist - stay tuned for apt.  Buena Irish, LCSW [address] [place], [place]: [phone]  Restart Trazodone - sent new rx Trazodone 100mg  tablets, start 1st 1-2 weeks HALF pill for 50mg  nightly, then can increase to 1 whole pill 100mg  nightly.  Recent Labs    02/06/18 0853 08/25/18 1347  HGBA1C 6.2* 6.4*   Keep working on improving diet / lifestyle to maintain, you are at the goal of < 7  -------------------  Urine test today for kidneys, make sure no protein.  We will request diabetic eye exam from [facility]  Next due Mammogram and DEXA Bone Density in Fall 2021  Look up Lipoma  ----------  DUE for FASTING BLOOD WORK (no food or drink after midnight before the lab appointment, only water or coffee without cream/sugar on the morning of)  SCHEDULE "Lab Only" visit in the morning at the clinic for lab draw in 5-6 MONTHS   - Make sure Lab Only appointment is at about 1 week before your next appointment, so that results will be available  For Lab Results, once available within 2-3 days of blood draw, you can can log in to MyChart online to view your results and a brief explanation. Also, we can discuss results at next follow-up visit.   Please schedule a Follow-up Appointment to: Return in about 6 months (around 02/25/2019) for Annual Physical.  If you have any other questions or concerns, please feel free to call the office or send a message through [facility]. You may also schedule an earlier appointment if necessary.  Additionally, you may be receiving a survey about your experience at our office within a few days to 1 week by e-mail or mail. We value your feedback.  Nobie Putnam, DO [place]

## 2018-08-25 NOTE — Assessment & Plan Note (Signed)
Stable chronic problem with mood, anxiety, secondary insomnia Persistent w/ life stressors family related still  Plan Encourage continue on current med Citalopram 40mg  daily INCREASE Trazodone 50 up to 100mg  nightly, abs, start half tab back at 50mg  dose, nightly for sleep and mood Referral to Buena Irish, LCSW for CBT therapy, for family stressors, most of her support system is involved in the stressor. F/u as needed

## 2018-08-25 NOTE — Assessment & Plan Note (Signed)
Well controlled DM2 w/ A1c 6.4, slightly increased attributed to diet No hypoglycemia Complications - CKD-III stable, other including hyperlipidemia, GERD, depression, obesity - increases risk of future cardiovascular complications / poor glucose control due to reduced lifestyle diet/exercise with low energy mood and fatigue  Plan:  1. Remain diet controlled, off med 2. Encourage improved lifestyle - low carb, low sugar diet, reduce portion size, continue improving regular exercise 3. Check CBG , bring log to next visit for review 4. Continue Statin = future consider low dose ACEi/ARB 5. DM foot exam today, done 6. Urine Microalbumin today 7. Request DM Eye

## 2018-08-25 NOTE — Assessment & Plan Note (Signed)
Weight stable Goal to improve wt loss, keto diet, improve lifestyle

## 2018-08-25 NOTE — Assessment & Plan Note (Signed)
See A&P for depression

## 2018-08-25 NOTE — Assessment & Plan Note (Signed)
Chronic problem Goal for wt loss to help reduce edema Compression, Elevation Continue Lasix

## 2018-08-25 NOTE — Progress Notes (Signed)
Subjective:    Patient ID: Jenna [name], female    DOB: May 28, 1935, 83 y.o.   MRN: [id number]  Jenna [name] is a 83 y.o. female presenting on 08/25/2018 for Diabetes   HPI   CHRONIC DM, Type 2, w/ nephropathy / CKD-III Previous results A1c 6.1-6.2 Today A1c 6.4 No new concerns but admits some reduced adherence to diet. She plans to start Keto diet now. Meds:None Currentlynot onACEi / ARB Lifestyle - Weight +/- 1 lb in 6 months - Diet: Plans to start keto diet, wt loss - Exercise; Limited regular exercise but working on improving Scheduled for [facility] - frequently for injections in eyes for diabetes, will request record. Denies hypoglycemia, polyuria, visual changes, numbness or tingling.  Insomnia/ Chronic Depression, recurrent / Anxiety Reviewed last visit with background on depression. She now reports primary life stressor involves her daughter lives with her and her husband. Causing financial stress among other issues. - Currently doing fairly well on current dose Citalopram 40mg  daily Admits some issues with insomnia still describing difficulty falling asleep and staying asleep, tried half or whole pill Trazodone  She is interested in therapist as well No longer taking Trazodone.  Edema Bilateral LE and some upper extremity edema, chronic problem. With suspected venous insufficiency. She has new area of persistent swelling with possible cyst R forearm today, non tender, asking about this area.  Additional updates Paroxsymal tachycardia - Followed by Cardiology Dr Rockey Situ 04/2018, she will f/u with him again.  Health Maintenance: Due repeat DEXA in 2021, previous osteoporosis. Due next mammogram in 2021.  Depression screen [facility] 2/9 08/25/2018 02/13/2018 11/13/2017  Decreased Interest 2 0 1  Down, Depressed, Hopeless 1 0 1  PHQ - 2 Score 3 0 2  Altered sleeping 3 2 3   Tired, decreased energy 2 2 2   Change in appetite 2 3 1   Feeling bad or failure about  yourself  0 0 0  Trouble concentrating 0 0 0  Moving slowly or fidgety/restless 0 1 1  Suicidal thoughts 0 0 0  PHQ-9 Score 10 8 9   Difficult doing work/chores Not difficult at all Not difficult at all Somewhat difficult   GAD 7 : Generalized Anxiety Score 02/13/2018 11/13/2017  Nervous, Anxious, on Edge 1 0  Control/stop worrying 0 0  Worry too much - different things 0 0  Trouble relaxing 0 1  Restless 0 1  Easily annoyed or irritable 1 1  Afraid - awful might happen 0 0  Total GAD 7 Score 2 3  Anxiety Difficulty Somewhat difficult Not difficult at all     Social History   Tobacco Use  . Smoking status: Former Smoker    Packs/day: 1.00    Years: 20.00    Pack years: 20.00    Types: Cigarettes    Quit date: 1983    Years since quitting: 37.6  . Smokeless tobacco: Former Network engineer Use Topics  . Alcohol use: Not Currently    Comment: past  . Drug use: No    Review of Systems Per HPI unless specifically indicated above     Objective:    BP 135/61   Pulse 75   Temp 98.5 F (36.9 C) (Oral)   Resp 16   Ht 5\' 3"  (1.6 m)   Wt 209 lb (94.8 kg)   BMI 37.02 kg/m   Wt Readings from Last 3 Encounters:  08/25/18 209 lb (94.8 kg)  02/13/18 210 lb (95.3 kg)  11/12/17 209 lb (94.8 kg)    Physical Exam Vitals signs and nursing note reviewed.  Constitutional:      General: She is not in acute distress.    Appearance: She is well-developed. She is not diaphoretic.     Comments: Well-appearing, comfortable, cooperative  HENT:     Head: Normocephalic and atraumatic.  Eyes:     General:        Right eye: No discharge.        Left eye: No discharge.     Conjunctiva/sclera: Conjunctivae normal.  Neck:     Musculoskeletal: Normal range of motion and neck supple.     Thyroid: No thyromegaly.  Cardiovascular:     Rate and Rhythm: Normal rate and regular rhythm.     Heart sounds: Normal heart sounds. No murmur.     Comments: No ectopy heard Pulmonary:      Effort: Pulmonary effort is normal. No respiratory distress.     Breath sounds: Normal breath sounds. No wheezing or rales.  Musculoskeletal: Normal range of motion.     Right lower leg: Edema (+2 pitting bilateral) present.     Left lower leg: Edema (+2 pitting bilateral) present.     Comments: R forearm area of edema vs deeper induration vs possible lipoma, non tender no erythema  Lymphadenopathy:     Cervical: No cervical adenopathy.  Skin:    General: Skin is warm and dry.     Findings: No erythema or rash.  Neurological:     Mental Status: She is alert and oriented to person, place, and time.  Psychiatric:        Behavior: Behavior normal.     Comments: Well groomed, good eye contact, normal speech and thoughts    Diabetic Foot Exam - Simple   Simple Foot Form Diabetic Foot exam was performed with the following findings: Yes 08/25/2018  2:32 PM  Visual Inspection See comments: Yes Sensation Testing Intact to touch and monofilament testing bilaterally: Yes Pulse Check Posterior Tibialis and Dorsalis pulse intact bilaterally: Yes Comments Bilateral foot edema, peripheral edema. R Great toenail abnormal thickening.      I have personally reviewed the radiology report from 08/25/17 - care everywhere DXA.  DXA bone density8/05/2017 [place] Other Result Information  This result has an attachment that is not available.  Result Narrative  [facility]- DEXA Report    REFERRING MD: [facility]                        TECHNICIAN:  Clearence Ped HISTORY: FOLLOW UP BONE DENSITY. 83 YEAR OLD WHITE FEMALE.  SITE DATE BMD g/cm2  T-SCORE Z-SCORE g/cm2 CHANGE % CHANGE STATISTICALLY        SIGNIFICANT?  LUMBAR SPINE L1- L4           HIP L.FEM.NECK 9/20/6 0.806 -0.4      08/25/17 0.584 -2.4 -0.222 -27.5% YES   L.TOTAL HIP 9/20/6 0.952 +0.1      08/25/17 0.667 -2.3 -0.285 -29.9% YES  FOREARM L/R 33% 08/25/17  0.482 -3.5           INTERPRETATION:  Lumbar spine not reported due to scoliosis or sclerosis.   Osteoporosis. Interval decrease of bone density from 09/2004.   FRACTURE RISK ASSESSMENT (FRAX) __4.9___ 10 year risk of hip fracture.   __16___ 10 year risk of any  major fracture.   TREATMENT:  The National Osteoporosis Foundation (2008) recommends treatment for:  Patients with hip or vertebral fracture (clinical or morphometric) Patients with osteoporosis as defined by T score < = -2.5 Postmenopausal women or men age 39 and older with low bone mass (T score  -1.0 to -2.5, osteopenia) at the femoral neck, total hip, or spine and 10  year hip fracture risk probability >3% or a 10 year all major osteoporosis  related fracture probability of >20%. BMD should be monitored two years after initiating therapy and at two-year  intervals thereafter.  _______________________________________ G. Fayrene Fearing., MD, Julious Payer, [place]      Certified Clinical Densitometrist  The [place] is a Hologic QDR 4500. [facility] facility and technologist Least Significant Change [facility]): Technologist Site LS Spine  Site Hip L R  0.054 g/cm2  0.033 g/cm2 at 95% confidence level      Results for orders placed or performed in visit on 08/25/18  HM DEXA SCAN  Result Value Ref Range   HM Dexa Scan Osteoporosis -3.5 femur   POCT HgB A1C  Result Value Ref Range   Hemoglobin A1C 6.4 (A) 4.0 - 5.6 %  POCT UA - Microalbumin  Result Value Ref Range   Microalbumin Ur, POC 0 mg/L      Assessment & Plan:   Problem List Items Addressed This Visit    Bilateral leg edema    Chronic problem Goal for wt loss to help reduce edema Compression, Elevation Continue Lasix      Controlled type 2 diabetes mellitus with diabetic nephropathy, without long-term current use of insulin (HCC) - Primary    Well controlled DM2 w/ A1c 6.4, slightly increased attributed to diet No  hypoglycemia Complications - CKD-III stable, other including hyperlipidemia, GERD, depression, obesity - increases risk of future cardiovascular complications / poor glucose control due to reduced lifestyle diet/exercise with low energy mood and fatigue  Plan:  1. Remain diet controlled, off med 2. Encourage improved lifestyle - low carb, low sugar diet, reduce portion size, continue improving regular exercise 3. Check CBG , bring log to next visit for review 4. Continue Statin = future consider low dose ACEi/ARB 5. DM foot exam today, done 6. Urine Microalbumin today 7. Request DM Eye      Relevant Orders   POCT HgB A1C (Completed)   POCT UA - Microalbumin (Completed)   Depression, major, recurrent (HCC)    Stable chronic problem with mood, anxiety, secondary insomnia Persistent w/ life stressors family related still  Plan Encourage continue on current med Citalopram 40mg  daily INCREASE Trazodone 50 up to 100mg  nightly, abs, start half tab back at 50mg  dose, nightly for sleep and mood Referral to Buena Irish, LCSW for CBT therapy, for family stressors, most of her support system is involved in the stressor. F/u as needed      Relevant Medications   traZODone (DESYREL) 100 MG tablet   Other Relevant Orders   Ambulatory referral to Social Work   Obesity (BMI 35.0-39.9 without comorbidity)    Weight stable Goal to improve wt loss, keto diet, improve lifestyle      Psychophysiological insomnia    See A&P for depression      Relevant Medications   traZODone (DESYREL) 100 MG tablet   Other Relevant Orders   Ambulatory referral to Social Work      Meds ordered this encounter  Medications  . traZODone (DESYREL) 100 MG tablet    Sig: Take 1 tablet (100 mg total) by mouth at bedtime. May start with half pill  for 50mg  nightly for 1-2 weeks then increase to whole pill nightly.    Dispense:  30 tablet    Refill:  2     Follow up plan: Return in about 6 months (around  02/25/2019) for Annual Physical.  Future labs ordered for 01/25/19  Nobie Putnam, DO [facility] 08/25/2018, 1:34 PM

## 2018-08-31 ENCOUNTER — Other Ambulatory Visit: Payer: Self-pay

## 2018-08-31 DIAGNOSIS — F331 Major depressive disorder, recurrent, moderate: Secondary | ICD-10-CM

## 2018-08-31 MED ORDER — CITALOPRAM HYDROBROMIDE 40 MG PO TABS: 40.0000 mg | ORAL_TABLET | Freq: Every day | ORAL | 1 refills | Status: DC

## 2018-10-13 ENCOUNTER — Encounter: Payer: Self-pay | Admitting: Family Medicine

## 2018-10-13 ENCOUNTER — Other Ambulatory Visit: Payer: Self-pay | Admitting: Family Medicine

## 2018-10-13 DIAGNOSIS — E1169 Type 2 diabetes mellitus with other specified complication: Secondary | ICD-10-CM

## 2018-10-13 DIAGNOSIS — H35352 Cystoid macular degeneration, left eye: Secondary | ICD-10-CM | POA: Diagnosis not present

## 2018-10-13 DIAGNOSIS — R600 Localized edema: Secondary | ICD-10-CM

## 2018-10-13 DIAGNOSIS — E113291 Type 2 diabetes mellitus with mild nonproliferative diabetic retinopathy without macular edema, right eye: Secondary | ICD-10-CM | POA: Insufficient documentation

## 2018-10-13 DIAGNOSIS — E113212 Type 2 diabetes mellitus with mild nonproliferative diabetic retinopathy with macular edema, left eye: Secondary | ICD-10-CM | POA: Insufficient documentation

## 2018-10-13 LAB — HM DIABETES EYE EXAM

## 2018-10-13 MED ORDER — FUROSEMIDE 20 MG PO TABS: 20.0000 mg | ORAL_TABLET | Freq: Every day | ORAL | 1 refills | Status: DC

## 2018-10-13 MED ORDER — SIMVASTATIN 40 MG PO TABS: 40.0000 mg | ORAL_TABLET | Freq: Every day | ORAL | 1 refills | Status: DC

## 2018-10-14 ENCOUNTER — Telehealth: Payer: Self-pay | Admitting: Family Medicine

## 2018-10-14 NOTE — Telephone Encounter (Signed)
Patient was around daughter in law was tested positive and sick they just want to be tested  Since had exposure but asymptomatic,  the information for testing site were given.

## 2018-10-14 NOTE — Telephone Encounter (Signed)
Pt and husband were in contact with a family member that tested postive for covid, its been two weekends ago, pt and husband do not have any symptoms at the moment. Daughter want to talk to Dr or CMA to see what they can do, Please advise.

## 2018-10-15 ENCOUNTER — Other Ambulatory Visit: Payer: Self-pay

## 2018-10-15 DIAGNOSIS — R6889 Other general symptoms and signs: Secondary | ICD-10-CM | POA: Diagnosis not present

## 2018-10-15 DIAGNOSIS — Z20822 Contact with and (suspected) exposure to covid-19: Secondary | ICD-10-CM

## 2018-10-16 LAB — NOVEL CORONAVIRUS, NAA: SARS-CoV-2, NAA: NOT DETECTED

## 2018-10-22 ENCOUNTER — Other Ambulatory Visit: Payer: Self-pay

## 2018-10-22 ENCOUNTER — Telehealth: Payer: Self-pay | Admitting: Cardiovascular Disease

## 2018-10-22 ENCOUNTER — Encounter: Payer: Self-pay | Admitting: Nurse Practitioner

## 2018-10-22 ENCOUNTER — Ambulatory Visit (INDEPENDENT_AMBULATORY_CARE_PROVIDER_SITE_OTHER): Payer: Medicare Other | Admitting: Nurse Practitioner

## 2018-10-22 VITALS — BP 110/62 | HR 75 | Ht 63.0 in | Wt 200.5 lb

## 2018-10-22 DIAGNOSIS — I471 Supraventricular tachycardia: Secondary | ICD-10-CM

## 2018-10-22 DIAGNOSIS — E782 Mixed hyperlipidemia: Secondary | ICD-10-CM | POA: Diagnosis not present

## 2018-10-22 MED ORDER — METOPROLOL TARTRATE 25 MG PO TABS: 12.5000 mg | ORAL_TABLET | Freq: Two times a day (BID) | ORAL | 3 refills | Status: DC

## 2018-10-22 NOTE — Telephone Encounter (Signed)
° ° ° ° °  FULL LENGTH CONSENT FOR TELE-HEALTH VISIT   I hereby voluntarily request, consent and authorize [facility] and its employed or contracted physicians, physician assistants, nurse practitioners or other licensed health care professionals (the Practitioner), to provide me with telemedicine health care services (the Services") as deemed necessary by the treating Practitioner. I acknowledge and consent to receive the Services by the Practitioner via telemedicine. I understand that the telemedicine visit will involve communicating with the Practitioner through live audiovisual communication technology and the disclosure of certain medical information by electronic transmission. I acknowledge that I have been given the opportunity to request an in-person assessment or other available alternative prior to the telemedicine visit and am voluntarily participating in the telemedicine visit.  I understand that I have the right to withhold or withdraw my consent to the use of telemedicine in the course of my care at any time, without affecting my right to future care or treatment, and that the Practitioner or I may terminate the telemedicine visit at any time. I understand that I have the right to inspect all information obtained and/or recorded in the course of the telemedicine visit and may receive copies of available information for a reasonable fee.  I understand that some of the potential risks of receiving the Services via telemedicine include:   Delay or interruption in medical evaluation due to technological equipment failure or disruption;  Information transmitted may not be sufficient (e.g. poor resolution of images) to allow for appropriate medical decision making by the Practitioner; and/or   In rare instances, security protocols could fail, causing a breach of personal health information.  Furthermore, I acknowledge that it is my responsibility to provide information about my medical  history, conditions and care that is complete and accurate to the best of my ability. I acknowledge that Practitioner's advice, recommendations, and/or decision may be based on factors not within their control, such as incomplete or inaccurate data provided by me or distortions of diagnostic images or specimens that may result from electronic transmissions. I understand that the practice of medicine is not an exact science and that Practitioner makes no warranties or guarantees regarding treatment outcomes. I acknowledge that I will receive a copy of this consent concurrently upon execution via email to the email address I last provided but may also request a printed copy by calling the office of [facility].    I understand that my insurance will be billed for this visit.   I have read or had this consent read to me.  I understand the contents of this consent, which adequately explains the benefits and risks of the Services being provided via telemedicine.   I have been provided ample opportunity to ask questions regarding this consent and the Services and have had my questions answered to my satisfaction.  I give my informed consent for the services to be provided through the use of telemedicine in my medical care  By participating in this telemedicine visit I agree to the above.

## 2018-10-22 NOTE — Patient Instructions (Signed)
Medication Instructions:  1- START Lopressor Take 0.5 tablets (12.5 mg total) by mouth 2 (two) times daily If you need a refill on your cardiac medications before your next appointment, please call your pharmacy.   Lab work: None ordered  If you have labs (blood work) drawn today and your tests are completely normal, you will receive your results only by: Marland Kitchen MyChart Message (if you have MyChart) OR . A paper copy in the mail If you have any lab test that is abnormal or we need to change your treatment, we will call you to review the results.  Testing/Procedures: None ordered   Follow-Up: At [facility], you and your health needs are our priority.  As part of our continuing mission to provide you with exceptional heart care, we have created designated Provider Care Teams.  These Care Teams include your primary Cardiologist (physician) and Advanced Practice Providers (APPs -  Physician Assistants and Nurse Practitioners) who all work together to provide you with the care you need, when you need it. You will need a follow up appointment in 1 months.  You may see Dr. Rockey Situ or Murray Hodgkins, NP.

## 2018-10-22 NOTE — Progress Notes (Signed)
Office Visit    Patient Name: Jenna [name] Date of Encounter: 10/22/2018  Primary Care Provider:  Olin Hauser, DO Primary Cardiologist:  Ida Rogue, MD  Chief Complaint    A 83 year old female with a past medical history of hyperlipidemia, type 2 diabetes, chronic kidney disease III, anxiety, COPD, syncope, and palpitations presents for a follow-up for palpitations and psvt.  Past Medical History    Past Medical History:  Diagnosis Date   Anxiety    Arthritis    neck, knees(before replacements)   Asthma    CKD (chronic kidney disease), stage III    COPD (chronic obstructive pulmonary disease) ([place])    Encounter for colonoscopy due to history of adenomatous colonic polyps    GERD (gastroesophageal reflux disease)    Hearing loss of both ears    History of echocardiogram    a. 10/2013 Echo: EF 60-65%, no rwma, midlly dil LA. Rnl RV fxn.   Hyperlipidemia    Osteoporosis    Palpitations    PSVT (paroxysmal supraventricular tachycardia) ([place])    a. 05/2018 [place]: 99 SVT runs. Fastest 218 x 4:30. Longest 4:38 w/ rate of 172.   Sleep apnea    resolved with gastric bypass   Syncope and collapse    Urine incontinence    Past Surgical History:  Procedure Laterality Date   BROW LIFT Bilateral 06/27/2015   Procedure: BLEPHAROPLASTY BILATERAL UPPER EYELIDS BILATERAL BLEPHAROTOSIS EYELIDS;  Surgeon: Karle Starch, MD;  Location: [facility];  Service: Ophthalmology;  Laterality: Bilateral;  BILATERAL   CATARACT EXTRACTION W/PHACO Right 02/13/2015   Procedure: CATARACT EXTRACTION PHACO AND INTRAOCULAR LENS PLACEMENT (IOC);  Surgeon: Ronnell Freshwater, MD;  Location: [facility];  Service: Ophthalmology;  Laterality: Right;   CATARACT EXTRACTION W/PHACO Left 03/22/2015   Procedure: CATARACT EXTRACTION PHACO AND INTRAOCULAR LENS PLACEMENT (IOC);  Surgeon: Ronnell Freshwater, MD;  Location: [facility];  Service:  Ophthalmology;  Laterality: Left;  [place]   COLECTOMY     COSMETIC SURGERY  2012   tummy tuck and excess skin removal   GASTRIC BYPASS  2010   REPLACEMENT TOTAL KNEE BILATERAL  1998   SHOULDER SURGERY  2009   left    TONSILLECTOMY     TOTAL ABDOMINAL HYSTERECTOMY  1979    Allergies  Allergies  Allergen Reactions   Azithromycin Other (See Comments)    Causes stomach burning pains   Codeine Other (See Comments)    "will not stay down"   Hydromorphone Other (See Comments)    confusion, personality change   Morphine Other (See Comments)    Goes crazy   Nsaids Other (See Comments)    Avoids because of gastric bypass surgery   Oxycodone Nausea And Vomiting   Oxycodone-Acetaminophen Nausea And Vomiting   Nabumetone Rash   Promethazine Hcl Rash and Other (See Comments)    History of Present Illness    A 83 year old female with a past medical history of hyperlipidemia, type 2 diabetes, chronic kidney disease III, anxiety, COPD, syncope, and palpitations.. In May 2020, long term monitor showed normal sinus rhythm with 99 SVT runs lasting up to 4 minutes 30 secs, max rate of 210 bpm, and average rate 172 bpm. She was offered  blocker therapy but following monitoring, she wasn't having symptoms and thus med mgmt was deferred.  She was doing well following her last visit, but about a a week ago, she started  noting an increase in palpitations with lightheadedness and quivering of her hands, lasting a few minutes and resolving spontaneously.  She is now having episodes several x/day, lasting under a minute, and resolving spontaneously.  She denies chest pain, dyspnea, pnd, orthopnea, n, v, syncope, weight gain, or early satiety. She recently started a keto diet about 4 weeks ago and has lost 20 Ibs.   Home Medications    Prior to Admission medications   Medication Sig Start Date End Date Taking? Authorizing Provider  albuterol (VENTOLIN HFA) 108  (90 Base) MCG/ACT inhaler Inhale 2 puffs into the lungs every 6 (six) hours as needed for wheezing or shortness of breath. 06/29/18   Karamalegos, Devonne Doughty, DO  alendronate (FOSAMAX) 70 MG tablet Take 1 tablet (70 mg total) by mouth once a week. 08/12/18   Karamalegos, Devonne Doughty, DO  Ascorbic Acid (VITAMIN C) 1000 MG tablet Take 500 mg by mouth daily.     [provider]  Bevacizumab 3.75 MG/0.15ML SOSY 1.25 mg once    [provider]  Cholecalciferol (VITAMIN D-1000 MAX ST) 1000 UNITS tablet Take 1,000 Units by mouth daily.     [provider]  citalopram (CELEXA) 40 MG tablet Take 1 tablet (40 mg total) by mouth daily. 08/31/18   Karamalegos, Devonne Doughty, DO  Cyanocobalamin (RA VITAMIN B-12 TR) 1000 MCG TBCR Take 1,000 mcg by mouth daily.     [provider]  diclofenac sodium (VOLTAREN) 1 % GEL Apply 2 g topically 3 (three) times daily as needed. For hands, arthritis 11/12/17   Olin Hauser, DO  ferrous sulfate 325 (65 FE) MG EC tablet Take 325 mg by mouth at bedtime.     [provider]  furosemide (LASIX) 20 MG tablet Take 1 tablet (20 mg total) by mouth daily. 10/13/18   Karamalegos, Devonne Doughty, DO  ipratropium (ATROVENT) 0.02 % nebulizer solution Take 0.5 mg by nebulization 4 (four) times daily.    [provider]  methocarbamol (ROBAXIN) 500 MG tablet Take 1 tablet (500 mg total) by mouth every 8 (eight) hours as needed for muscle spasms. 02/16/18   Karamalegos, Devonne Doughty, DO  Multiple Vitamins-Minerals (ONE-A-DAY WOMENS PETITES) TABS Take 2 tablets by mouth every morning.    [provider]  Omega-3 Fatty Acids (FISH OIL) 1000 MG CAPS Take 1,000 mg by mouth daily.    [provider]  Pediatric Multiple Vit-C-FA (CHEWABLE VITE CHILDRENS) CHEW Chew by mouth.    [provider]  ranitidine (ZANTAC) 150 MG tablet Take 1 tablet (150 mg total) by mouth 2 (two) times daily. 06/19/16 06/19/17  Bettey Costa, MD   simvastatin (ZOCOR) 40 MG tablet Take 1 tablet (40 mg total) by mouth daily. 10/13/18   Karamalegos, Devonne Doughty, DO  traZODone (DESYREL) 100 MG tablet Take 1 tablet (100 mg total) by mouth at bedtime. May start with half pill for 50mg  nightly for 1-2 weeks then increase to whole pill nightly. 08/25/18   Olin Hauser, DO    Review of Systems    She states she is experiencing an increase in palpitations episodes with lightheadedness and quivering of her hands, lasting < 1 min- more freq in the last week. Continues to have lower extremity edema, L>R - improvement with resting and elevation of extremities. All other systems reviewed and are otherwise negative except as noted above.  Physical Exam    VS:  BP 110/62 (BP Location: Left Arm, Patient Position: Sitting, Cuff Size: Normal)  Pulse 75    Ht 5\' 3"  (1.6 m)    Wt 200 lb 8 oz (90.9 kg)    BMI 35.52 kg/m  , BMI Body mass index is 35.52 kg/m. GEN: Well nourished, well developed, in no acute distress. HEENT: normal. Neck: Supple, no JVD, carotid bruits, or masses. Cardiac: RRR, no murmurs, rubs, or gallops. No clubbing, cyanosis.  Radials 2+ and equal bilaterally. Trace bilat ankle swelling, L > R (non-pitting on R). Respiratory:  Respirations regular and unlabored, clear to auscultation bilaterally. GI: Soft, nontender, nondistended, BS + x 4. MS: no deformity or atrophy. Skin: warm, very dry and flaky, no rash. Neuro:  Strength and sensation are intact. Psych: Normal affect.  Accessory Clinical Findings    ECG personally reviewed by me today -sinus rhythm with PAC, 75, LAD, lat TWI, no acute changes or ST elevation.  Lab Results  Component Value Date   WBC 5.8 02/06/2018   HGB 14.1 02/06/2018   HCT 41.9 02/06/2018   MCV 94.8 02/06/2018   PLT 206 02/06/2018   Lab Results  Component Value Date   CREATININE 0.94 (H) 02/06/2018   BUN 21 02/06/2018   NA 139 02/06/2018   K CANCELED 02/06/2018   CL 103 02/06/2018    CO2 28 02/06/2018   Lab Results  Component Value Date   ALT 24 02/06/2018   AST CANCELED 02/06/2018   ALKPHOS 184 (H) 09/23/2015   BILITOT 0.6 02/06/2018   Lab Results  Component Value Date   CHOL 150 02/06/2018   HDL 68 02/06/2018   LDLCALC 61 02/06/2018   TRIG 131 02/06/2018   CHOLHDL 2.2 02/06/2018     Assessment & Plan    1. Paroxysmal SVT:  In May 2020, long term monitor showed normal sinus rhythm with 99 SVT runs lasting up to 4 minutes 30 secs, max rate of 210 bpm, and average rate 172 bpm. Following telemedicine visit in April, she was experiencing palpitations about once every 2 weeks. Last week, she started experiencing an increase in palpitations with lightheadedness and quivering of her hands, lasting a few minutes, and resolving spont. Will start her on Metoprolol 12.5mg  twice daily. If she does not tolerate 2/2 BP or  blocker is ineffective, will need to consider EP referral.  2. COPD: Reports her breathing is stable and does not wear oxygen. Remote history of smoking. Uses inhaler. Does not feel inhaler contributing to her arrhythmia due to episodes being sporadic through the day.   3. Hyperlipidemia associated with Type 2 Diabetes Mellitus: LDL 61 in 01/2018, controlled with Omega 3 and Simvastatin. A1C 6.4 in 08/20 and managed by primary care.   4. Bilateral leg edema: Stable lower extremity edema, L > R. Trace of pitting edema to the left ankle in the setting of prior left left leg trauma. Reports edema improves with elevation. Reports she is unable to wear compression stockings due to them not staying up.   5. Disposition: Start Metoprolol 12.5mg  twice daily. Will follow up in 1 month.   Murray Hodgkins, NP 10/22/2018, 4:29 PM

## 2018-11-23 NOTE — Progress Notes (Signed)
Virtual Visit via Video Note   This visit type was conducted due to national recommendations for restrictions regarding the COVID-19 Pandemic (e.g. social distancing) in an effort to limit this patient's exposure and mitigate transmission in our community.  Due to her co-morbid illnesses, this patient is at least at moderate risk for complications without adequate follow up.  This format is felt to be most appropriate for this patient at this time.  All issues noted in this document were discussed and addressed.  A limited physical exam was performed with this format.  Please refer to the patient's chart for her consent to telehealth for [facility].   I connected with  Jenna [name] on [id number] by a video enabled telemedicine application and verified that I am speaking with the correct person using two identifiers. I discussed the limitations of evaluation and management by telemedicine. The patient expressed understanding and agreed to proceed.   Evaluation Performed:  Follow-up visit  Date:  99991111   ID:  Jenna [name], DOB [birth year], MRN [id number]  Patient Location:  [name] [address]   Provider location:   [facility], [place] office  PCP:  Olin Hauser, DO  Cardiologist:  Patsy Baltimore   Chief Complaint: Paroxysmal tachycardia   History of Present Illness:    Jenna [name] is a 83 y.o. female who presents via audio/video conferencing for a telehealth visit today.   The patient does not symptoms concerning for COVID-19 infection (fever, chills, cough, or new SHORTNESS OF BREATH).   Patient has a past medical history of obesity,  Hypertension, hyperlipidemia,  GERD,  chronic cough,  gastric bypass surgery,  depression,   chronic leg edema  Type 2 diabetes hemoglobin A1c 6.2 COPD Former smoker Stage III chronic kidney disease Stable chronic problem with mood, anxiety, secondary insomnia  presents for evaluation of pericardial effusion seen on CT scan,  Palpitations  On last office visit 1 month ago reported having worsening palpitations, lightheadedness, quivering in her hands lasting several minutes that resolved spontaneously Several episodes per day lasting under a minute Started a keto diet 1 month prior to that and lost 20 pounds She was started on metoprolol 12.5 mg twice daily  Prior monitoring showing episodes of SVT  Arrhythmia doing better on metoprolol Very tired in the AM Rare 2-3 flutters, much better  HBA1C 6.4 CR 0.9 TSH 4.93   Other past medical history reviewed Prior history of chronic cough. Her other big complaint didn't GERD symptoms. Several other family members have GERD as well. She has had chronic lower extremity edema, prior trauma to her left lower extremity. She takes Lasix 20 mg daily with no significant improvement   Prior CV studies:   The following studies were reviewed today:   Past Medical History:  Diagnosis Date  . Anxiety   . Arthritis    neck, knees(before replacements)  . Asthma   . CKD (chronic kidney disease), stage III   . COPD (chronic obstructive pulmonary disease) ([place])   . Encounter for colonoscopy due to history of adenomatous colonic polyps   . GERD (gastroesophageal reflux disease)   . Hearing loss of both ears   . History of echocardiogram    a. 10/2013 Echo: EF 60-65%, no rwma, midlly dil LA. Rnl RV fxn.  . Hyperlipidemia   . Osteoporosis   . Palpitations   . PSVT (paroxysmal supraventricular tachycardia) ([place])    a.  05/2018 Zio: 99 SVT runs. Fastest 218 x 4:30. Longest 4:38 w/ rate of 172.  . Sleep apnea    resolved with gastric bypass  . Syncope and collapse   . Urine incontinence    Past Surgical History:  Procedure Laterality Date  . BROW LIFT Bilateral 06/27/2015   Procedure: BLEPHAROPLASTY BILATERAL UPPER EYELIDS BILATERAL BLEPHAROTOSIS EYELIDS;  Surgeon: Karle Starch, MD;  Location: [place];  Service: Ophthalmology;  Laterality: Bilateral;  BILATERAL  . CATARACT EXTRACTION W/PHACO Right 02/13/2015   Procedure: CATARACT EXTRACTION PHACO AND INTRAOCULAR LENS PLACEMENT (IOC);  Surgeon: Ronnell Freshwater, MD;  Location: [place];  Service: Ophthalmology;  Laterality: Right;  . CATARACT EXTRACTION W/PHACO Left 03/22/2015   Procedure: CATARACT EXTRACTION PHACO AND INTRAOCULAR LENS PLACEMENT (IOC);  Surgeon: Ronnell Freshwater, MD;  Location: [place];  Service: Ophthalmology;  Laterality: Left;  TORIC  . CHOLECYSTECTOMY  1984  . COLECTOMY    . COSMETIC SURGERY  2012   tummy tuck and excess skin removal  . GASTRIC BYPASS  2010  . REPLACEMENT TOTAL KNEE BILATERAL  1998  . SHOULDER SURGERY  2009   left   . TONSILLECTOMY    . TOTAL ABDOMINAL HYSTERECTOMY  1979     Current Meds  Medication Sig  . albuterol (VENTOLIN HFA) 108 (90 Base) MCG/ACT inhaler Inhale 2 puffs into the lungs every 6 (six) hours as needed for wheezing or shortness of breath.  Marland Kitchen alendronate (FOSAMAX) 70 MG tablet Take 1 tablet (70 mg total) by mouth once a week.  . Ascorbic Acid (VITAMIN C) 1000 MG tablet Take 500 mg by mouth daily.   . Cholecalciferol (VITAMIN D-1000 MAX ST) 1000 UNITS tablet Take 1,000 Units by mouth daily.   . citalopram (CELEXA) 40 MG tablet Take 1 tablet (40 mg total) by mouth daily.  . Cyanocobalamin (RA VITAMIN B-12 TR) 1000 MCG TBCR Take 1,000 mcg by mouth daily.   . diclofenac sodium (VOLTAREN) 1 % GEL Apply 2 g topically 3 (three) times daily as needed. For hands, arthritis  . famotidine (PEPCID) 20 MG tablet Take 20 mg by mouth 2 (two) times daily.  . ferrous sulfate 325 (65 FE) MG EC tablet Take 325 mg by mouth at bedtime.   . furosemide (LASIX) 20 MG tablet Take 1 tablet (20 mg total) by mouth daily. (Patient taking differently: Take 20 mg by mouth daily as needed. )  . ipratropium (ATROVENT) 0.02 % nebulizer solution Take 0.5 mg by  nebulization 4 (four) times daily.  . methocarbamol (ROBAXIN) 500 MG tablet Take 1 tablet (500 mg total) by mouth every 8 (eight) hours as needed for muscle spasms.  . metoprolol tartrate (LOPRESSOR) 25 MG tablet Take 0.5 tablets (12.5 mg total) by mouth 2 (two) times daily.  . Multiple Vitamins-Minerals (ONE-A-DAY WOMENS PETITES) TABS Take 2 tablets by mouth every morning.  . Omega-3 Fatty Acids (FISH OIL) 1000 MG CAPS Take 1,000 mg by mouth daily.  . Pediatric Multiple Vit-C-FA (CHEWABLE VITE CHILDRENS) CHEW Chew by mouth daily. occassionally  . simvastatin (ZOCOR) 40 MG tablet Take 1 tablet (40 mg total) by mouth daily.  . traZODone (DESYREL) 100 MG tablet Take 1 tablet (100 mg total) by mouth at bedtime. May start with half pill for 50mg  nightly for 1-2 weeks then increase to whole pill nightly.     Allergies:   Azithromycin, Codeine, Hydromorphone, Morphine, Nsaids, Oxycodone, Oxycodone-acetaminophen, Nabumetone, and Promethazine hcl   Social History   Tobacco  Use  . Smoking status: Former Smoker    Packs/day: 1.00    Years: 20.00    Pack years: 20.00    Types: Cigarettes    Quit date: 1983    Years since quitting: 37.8  . Smokeless tobacco: Former Network engineer Use Topics  . Alcohol use: Not Currently    Comment: past  . Drug use: No      Family Hx: The patient's family history includes Colon cancer in her sister; Diabetes type II in her father and mother; Heart attack in her brother and mother; Heart attack (age of onset: 65) in her sister; Hyperlipidemia in her sister; Hypertension in her mother and sister; Ovarian cancer in her sister; Pneumonia in her father; Skin cancer in her father.  ROS:   Please see the history of present illness.    Review of Systems  Constitutional: Negative.   HENT: Negative.   Respiratory: Negative.   Cardiovascular: Positive for palpitations.  Gastrointestinal: Negative.   Musculoskeletal: Negative.   Neurological: Negative.    Psychiatric/Behavioral: Negative.   All other systems reviewed and are negative.    Labs/Other Tests and Data Reviewed:    Recent Labs: 02/06/2018: ALT 24; BUN 21; Creat 0.94; Hemoglobin 14.1; Platelets 206; Potassium CANCELED; Sodium 139; TSH 4.93   Recent Lipid Panel Lab Results  Component Value Date/Time   CHOL 150 02/06/2018 08:53 AM   TRIG 131 02/06/2018 08:53 AM   HDL 68 02/06/2018 08:53 AM   CHOLHDL 2.2 02/06/2018 08:53 AM   LDLCALC 61 02/06/2018 08:53 AM    Wt Readings from Last 3 Encounters:  11/25/18 200 lb (90.7 kg)  10/22/18 200 lb 8 oz (90.9 kg)  08/25/18 209 lb (94.8 kg)     Exam:    Vital Signs: Vital signs may also be detailed in the HPI BP (!) 137/57 (BP Location: Left Arm, Patient Position: Sitting)   Pulse 80   Ht 5\' 3"  (1.6 m)   Wt 200 lb (90.7 kg)   BMI 35.43 kg/m   Wt Readings from Last 3 Encounters:  11/25/18 200 lb (90.7 kg)  10/22/18 200 lb 8 oz (90.9 kg)  08/25/18 209 lb (94.8 kg)   Temp Readings from Last 3 Encounters:  08/25/18 98.5 F (36.9 C) (Oral)  02/13/18 98 F (36.7 C) (Oral)  11/12/17 98.2 F (36.8 C) (Oral)   BP Readings from Last 3 Encounters:  11/25/18 (!) 137/57  10/22/18 110/62  08/25/18 135/61   Pulse Readings from Last 3 Encounters:  11/25/18 80  10/22/18 75  08/25/18 75   Well nourished, well developed female in no acute distress. Constitutional:  oriented to person, place, and time. No distress.    ASSESSMENT & PLAN:    Paroxysmal tachycardia (HCC) SVT, better on metoprolol 12.5 BID She prefers less pills, will change to metoprolol succinate 25 mg daily  Centrilobular emphysema (HCC) Breathing stable, uses inhalers as needed  Controlled type 2 diabetes mellitus with diabetic nephropathy, without long-term current use of insulin (HCC) Hemoglobin A1c numbers well controlled Recent 20 pound weight loss, following keto diet  Hyperlipidemia associated with type 2 diabetes mellitus ([place]) Cholesterol is  at goal on the current lipid regimen. No changes to the medications were made.  Stable  Essential hypertension No changes made apart from changing metoprolol tartrate to succinate 25 daily  Smoker Stopped smoking many years ago Still with residual COPD  Bilateral leg edema Left > right swelling Recommended compression hose  Chronic cough Reports cough  has resolved  No further work-up needed    COVID-19 Education: The signs and symptoms of COVID-19 were discussed with the patient and how to seek care for testing (follow up with PCP or arrange E-visit).  The importance of social distancing was discussed today.  Patient Risk:   After full review of this patients clinical status, I feel that they are at least moderate risk at this time.  Time:   Today, I have spent 25 minutes with the patient with telehealth technology discussing the cardiac and medical problems/diagnoses detailed above   10 min spent reviewing the chart prior to patient visit today   Medication Adjustments/Labs and Tests Ordered: Current medicines are reviewed at length with the patient today.  Concerns regarding medicines are outlined above.   Tests Ordered: No tests ordered   Medication Changes: No changes made   Disposition: Follow-up in 6 months   Signed, Ida Rogue, MD  11/25/2018 11:03 AM    [place] Office [id number], [place],  [address]

## 2018-11-25 ENCOUNTER — Telehealth (INDEPENDENT_AMBULATORY_CARE_PROVIDER_SITE_OTHER): Payer: Medicare Other | Admitting: Cardiovascular Disease

## 2018-11-25 ENCOUNTER — Encounter: Payer: Self-pay | Admitting: Cardiovascular Disease

## 2018-11-25 VITALS — BP 137/57 | HR 80 | Ht 63.0 in | Wt 200.0 lb

## 2018-11-25 DIAGNOSIS — F17200 Nicotine dependence, unspecified, uncomplicated: Secondary | ICD-10-CM

## 2018-11-25 DIAGNOSIS — I471 Supraventricular tachycardia: Secondary | ICD-10-CM

## 2018-11-25 DIAGNOSIS — E1169 Type 2 diabetes mellitus with other specified complication: Secondary | ICD-10-CM

## 2018-11-25 DIAGNOSIS — E1121 Type 2 diabetes mellitus with diabetic nephropathy: Secondary | ICD-10-CM

## 2018-11-25 DIAGNOSIS — J432 Centrilobular emphysema: Secondary | ICD-10-CM | POA: Diagnosis not present

## 2018-11-25 DIAGNOSIS — I479 Paroxysmal tachycardia, unspecified: Secondary | ICD-10-CM

## 2018-11-25 DIAGNOSIS — I10 Essential (primary) hypertension: Secondary | ICD-10-CM

## 2018-11-25 DIAGNOSIS — E782 Mixed hyperlipidemia: Secondary | ICD-10-CM

## 2018-11-25 DIAGNOSIS — R05 Cough: Secondary | ICD-10-CM

## 2018-11-25 DIAGNOSIS — R053 Chronic cough: Secondary | ICD-10-CM

## 2018-11-25 MED ORDER — METOPROLOL SUCCINATE ER 25 MG PO TB24: 25.0000 mg | ORAL_TABLET | Freq: Every day | ORAL | 3 refills | Status: DC

## 2018-11-25 NOTE — Patient Instructions (Addendum)
Medication Instructions:   Discontinue metoprolol tartrate (take only as needed for breakthrough tachycardia) Start metoprolol succinate 25 mg daily  If you need a refill on your cardiac medications before your next appointment, please call your pharmacy.    Lab work: No new labs needed   If you have labs (blood work) drawn today and your tests are completely normal, you will receive your results only by: Marland Kitchen MyChart Message (if you have MyChart) OR . A paper copy in the mail If you have any lab test that is abnormal or we need to change your treatment, we will call you to review the results.   Testing/Procedures: No new testing needed   Follow-Up: At [facility], you and your health needs are our priority.  As part of our continuing mission to provide you with exceptional heart care, we have created designated Provider Care Teams.  These Care Teams include your primary Cardiologist (physician) and Advanced Practice Providers (APPs -  Physician Assistants and Nurse Practitioners) who all work together to provide you with the care you need, when you need it.  . You will need a follow up appointment in 6 months .   Please call our office 2 months in advance to schedule this appointment.    . Providers on your designated Care Team:   . Murray Hodgkins, NP . Christell Faith, PA-C . Marrianne Mood, PA-C  Any Other Special Instructions Will Be Listed Below (If Applicable).  For educational health videos Log in to : [email or web address] Or : [email or web address], password : [email or web address]

## 2018-12-15 DIAGNOSIS — H35352 Cystoid macular degeneration, left eye: Secondary | ICD-10-CM | POA: Diagnosis not present

## 2018-12-22 ENCOUNTER — Ambulatory Visit (INDEPENDENT_AMBULATORY_CARE_PROVIDER_SITE_OTHER): Payer: Medicare Other

## 2018-12-22 VITALS — BP 122/72 | HR 59 | Ht 63.0 in | Wt 194.0 lb

## 2018-12-22 DIAGNOSIS — Z0000 Encounter for general adult medical examination without abnormal findings: Secondary | ICD-10-CM

## 2018-12-22 NOTE — Patient Instructions (Signed)
[name] , Thank you for taking time to come for your Medicare Wellness Visit. I appreciate your ongoing commitment to your health goals. Please review the following plan we discussed and let me know if I can assist you in the future.   Screening recommendations/referrals: Colonoscopy: no longer required Mammogram: no longer required Bone Density: up to date  Recommended yearly ophthalmology/optometry visit for glaucoma screening and checkup Recommended yearly dental visit for hygiene and checkup  Vaccinations: Influenza vaccine: up to date  Pneumococcal vaccine: up to date Tdap vaccine: due now  Shingles vaccine: shingrix completed    Advanced directives: please pick up a copy of this information next time you come to the office.   Conditions/risks identified: diabetic, disucssed chronic care management team.   Next appointment: Follow up in one year for your annual wellness visit    Preventive Care 65 Years and Older, Female Preventive care refers to lifestyle choices and visits with your health care provider that can promote health and wellness. What does preventive care include?  A yearly physical exam. This is also called an annual well check.  Dental exams once or twice a year.  Routine eye exams. Ask your health care provider how often you should have your eyes checked.  Personal lifestyle choices, including:  Daily care of your teeth and gums.  Regular physical activity.  Eating a healthy diet.  Avoiding tobacco and drug use.  Limiting alcohol use.  Practicing safe sex.  Taking low-dose aspirin every day.  Taking vitamin and mineral supplements as recommended by your health care provider. What happens during an annual well check? The services and screenings done by your health care provider during your annual well check will depend on your age, overall health, lifestyle risk factors, and family history of disease. Counseling  Your health care provider  may ask you questions about your:  Alcohol use.  Tobacco use.  Drug use.  Emotional well-being.  Home and relationship well-being.  Sexual activity.  Eating habits.  History of falls.  Memory and ability to understand (cognition).  Work and work Statistician.  Reproductive health. Screening  You may have the following tests or measurements:  Height, weight, and BMI.  Blood pressure.  Lipid and cholesterol levels. These may be checked every 5 years, or more frequently if you are over 17 years old.  Skin check.  Lung cancer screening. You may have this screening every year starting at age 81 if you have a 30-pack-year history of smoking and currently smoke or have quit within the past 15 years.  Fecal occult blood test (FOBT) of the stool. You may have this test every year starting at age 29.  Flexible sigmoidoscopy or colonoscopy. You may have a sigmoidoscopy every 5 years or a colonoscopy every 10 years starting at age 22.  Hepatitis C blood test.  Hepatitis B blood test.  Sexually transmitted disease (STD) testing.  Diabetes screening. This is done by checking your blood sugar (glucose) after you have not eaten for a while (fasting). You may have this done every 1-3 years.  Bone density scan. This is done to screen for osteoporosis. You may have this done starting at age 1.  Mammogram. This may be done every 1-2 years. Talk to your health care provider about how often you should have regular mammograms. Talk with your health care provider about your test results, treatment options, and if necessary, the need for more tests. Vaccines  Your health care provider may recommend certain  vaccines, such as:  Influenza vaccine. This is recommended every year.  Tetanus, diphtheria, and acellular pertussis (Tdap, Td) vaccine. You may need a Td booster every 10 years.  Zoster vaccine. You may need this after age 26.  Pneumococcal 13-valent conjugate (PCV13) vaccine.  One dose is recommended after age 46.  Pneumococcal polysaccharide (PPSV23) vaccine. One dose is recommended after age 29. Talk to your health care provider about which screenings and vaccines you need and how often you need them. This information is not intended to replace advice given to you by your health care provider. Make sure you discuss any questions you have with your health care provider. Document Released: 02/03/2015 Document Revised: 09/27/2015 Document Reviewed: 11/08/2014 Elsevier Interactive Patient Education  2017 [employer] Prevention in the Home Falls can cause injuries. They can happen to people of all ages. There are many things you can do to make your home safe and to help prevent falls. What can I do on the outside of my home?  Regularly fix the edges of walkways and driveways and fix any cracks.  Remove anything that might make you trip as you walk through a door, such as a raised step or threshold.  Trim any bushes or trees on the path to your home.  Use bright outdoor lighting.  Clear any walking paths of anything that might make someone trip, such as rocks or tools.  Regularly check to see if handrails are loose or broken. Make sure that both sides of any steps have handrails.  Any raised decks and porches should have guardrails on the edges.  Have any leaves, snow, or ice cleared regularly.  Use sand or salt on walking paths during winter.  Clean up any spills in your garage right away. This includes oil or grease spills. What can I do in the bathroom?  Use night lights.  Install grab bars by the toilet and in the tub and shower. Do not use towel bars as grab bars.  Use non-skid mats or decals in the tub or shower.  If you need to sit down in the shower, use a plastic, non-slip stool.  Keep the floor dry. Clean up any water that spills on the floor as soon as it happens.  Remove soap buildup in the tub or shower regularly.  Attach bath  mats securely with double-sided non-slip rug tape.  Do not have throw rugs and other things on the floor that can make you trip. What can I do in the bedroom?  Use night lights.  Make sure that you have a light by your bed that is easy to reach.  Do not use any sheets or blankets that are too big for your bed. They should not hang down onto the floor.  Have a firm chair that has side arms. You can use this for support while you get dressed.  Do not have throw rugs and other things on the floor that can make you trip. What can I do in the kitchen?  Clean up any spills right away.  Avoid walking on wet floors.  Keep items that you use a lot in easy-to-reach places.  If you need to reach something above you, use a strong step stool that has a grab bar.  Keep electrical cords out of the way.  Do not use floor polish or wax that makes floors slippery. If you must use wax, use non-skid floor wax.  Do not have throw rugs and other things  on the floor that can make you trip. What can I do with my stairs?  Do not leave any items on the stairs.  Make sure that there are handrails on both sides of the stairs and use them. Fix handrails that are broken or loose. Make sure that handrails are as long as the stairways.  Check any carpeting to make sure that it is firmly attached to the stairs. Fix any carpet that is loose or worn.  Avoid having throw rugs at the top or bottom of the stairs. If you do have throw rugs, attach them to the floor with carpet tape.  Make sure that you have a light switch at the top of the stairs and the bottom of the stairs. If you do not have them, ask someone to add them for you. What else can I do to help prevent falls?  Wear shoes that:  Do not have high heels.  Have rubber bottoms.  Are comfortable and fit you well.  Are closed at the toe. Do not wear sandals.  If you use a stepladder:  Make sure that it is fully opened. Do not climb a closed  stepladder.  Make sure that both sides of the stepladder are locked into place.  Ask someone to hold it for you, if possible.  Clearly mark and make sure that you can see:  Any grab bars or handrails.  First and last steps.  Where the edge of each step is.  Use tools that help you move around (mobility aids) if they are needed. These include:  Canes.  Walkers.  Scooters.  Crutches.  Turn on the lights when you go into a dark area. Replace any light bulbs as soon as they burn out.  Set up your furniture so you have a clear path. Avoid moving your furniture around.  If any of your floors are uneven, fix them.  If there are any pets around you, be aware of where they are.  Review your medicines with your doctor. Some medicines can make you feel dizzy. This can increase your chance of falling. Ask your doctor what other things that you can do to help prevent falls. This information is not intended to replace advice given to you by your health care provider. Make sure you discuss any questions you have with your health care provider. Document Released: 11/03/2008 Document Revised: 06/15/2015 Document Reviewed: 02/11/2014 Elsevier Interactive Patient Education  2017 [employer].

## 2018-12-22 NOTE — Progress Notes (Signed)
Subjective:   Jenna [name] is a 83 y.o. female who presents for Medicare Annual (initial) preventive examination.  This visit is being conducted via phone call  - after an attmept to do on video chat - due to the COVID-19 pandemic. This patient has given me verbal consent via phone to conduct this visit, patient states they are participating from their home address. Some vital signs may be absent or patient reported.   Patient identification: identified by name, DOB, and current address.    Review of Systems:   Cardiac Risk Factors include: advanced age (>20men, >26 women);diabetes mellitus;dyslipidemia;hypertension;smoking/ tobacco exposure     Objective:     Vitals: BP 122/72   Pulse (!) 59   Ht 5\' 3"  (1.6 m)   Wt 194 lb (88 kg)   BMI 34.37 kg/m   Body mass index is 34.37 kg/m.  Advanced Directives 12/22/2018 09/24/2017 06/16/2016 06/16/2016 09/22/2015 09/20/2015 06/27/2015  Does Patient Have a Medical Advance Directive? No Yes No No No No No  Would patient like information on creating a medical advance directive? - - Yes (Inpatient - patient requests chaplain consult to create a medical advance directive) Yes (ED - Information included in AVS) - No - patient declined information Yes - Educational materials given    Tobacco Social History   Tobacco Use  Smoking Status Former Smoker  . Packs/day: 1.00  . Years: 20.00  . Pack years: 20.00  . Types: Cigarettes  . Quit date: 07/1981  . Years since quitting: 37.4  Smokeless Tobacco Never Used     Counseling given: Not Answered   Clinical Intake:  Pre-visit preparation completed: Yes  Pain : No/denies pain Pain Score: 0-No pain     Nutritional Risks: None Diabetes: No  How often do you need to have someone help you when you read instructions, pamphlets, or other written materials from your doctor or pharmacy?: 1 - Never    Diabetic Exams:  Diabetic Eye Exam: Completed 10/13/2018.  Diabetic Foot Exam:  Completed 08/25/2018   Interpreter Needed?: No  Information entered by :: Shronda Boeh,LPN  Past Medical History:  Diagnosis Date  . Anxiety   . Arthritis    neck, knees(before replacements)  . Asthma   . CKD (chronic kidney disease), stage III   . COPD (chronic obstructive pulmonary disease) ([place])   . Encounter for colonoscopy due to history of adenomatous colonic polyps   . GERD (gastroesophageal reflux disease)   . Hearing loss of both ears   . History of echocardiogram    a. 10/2013 Echo: EF 60-65%, no rwma, midlly dil LA. Rnl RV fxn.  . Hyperlipidemia   . Osteoporosis   . Palpitations   . PSVT (paroxysmal supraventricular tachycardia) ([place])    a. 05/2018 [facility]: 99 SVT runs. Fastest 218 x 4:30. Longest 4:38 w/ rate of 172.  . Sleep apnea    resolved with gastric bypass  . Syncope and collapse   . Urine incontinence    Past Surgical History:  Procedure Laterality Date  . BROW LIFT Bilateral 06/27/2015   Procedure: BLEPHAROPLASTY BILATERAL UPPER EYELIDS BILATERAL BLEPHAROTOSIS EYELIDS;  Surgeon: Karle Starch, MD;  Location: [facility];  Service: Ophthalmology;  Laterality: Bilateral;  BILATERAL  . CATARACT EXTRACTION W/PHACO Right 02/13/2015   Procedure: CATARACT EXTRACTION PHACO AND INTRAOCULAR LENS PLACEMENT (IOC);  Surgeon: Ronnell Freshwater, MD;  Location: [facility];  Service: Ophthalmology;  Laterality: Right;  . CATARACT EXTRACTION W/PHACO Left 03/22/2015  Procedure: CATARACT EXTRACTION PHACO AND INTRAOCULAR LENS PLACEMENT (IOC);  Surgeon: Ronnell Freshwater, MD;  Location: [facility];  Service: Ophthalmology;  Laterality: Left;  TORIC  . CHOLECYSTECTOMY  1984  . COLECTOMY    . COSMETIC SURGERY  2012   tummy tuck and excess skin removal  . GASTRIC BYPASS  2010  . REPLACEMENT TOTAL KNEE BILATERAL  1998  . SHOULDER SURGERY  2009   left   . TONSILLECTOMY    . TOTAL ABDOMINAL HYSTERECTOMY  1979   Family History  Problem Relation  Age of Onset  . Heart attack Mother   . Hypertension Mother   . Diabetes type II Mother   . Pneumonia Father   . Skin cancer Father   . Diabetes type II Father   . Colon cancer Sister   . Ovarian cancer Sister   . Heart attack Brother   . Heart attack Sister 59  . Hyperlipidemia Sister   . Hypertension Sister    Social History   Socioeconomic History  . Marital status: Married    Spouse name: Not on file  . Number of children: Not on file  . Years of education: Teacher, English as a foreign language   . Highest education level: High school graduate  Occupational History  . Not on file  Social Needs  . Financial resource strain: Not very hard  . Food insecurity    Worry: Never true    Inability: Never true  . Transportation needs    Medical: No    Non-medical: No  Tobacco Use  . Smoking status: Former Smoker    Packs/day: 1.00    Years: 20.00    Pack years: 20.00    Types: Cigarettes    Quit date: 07/1981    Years since quitting: 37.4  . Smokeless tobacco: Never Used  Substance and Sexual Activity  . Alcohol use: Not Currently    Comment: past  . Drug use: No  . Sexual activity: Not on file  Lifestyle  . Physical activity    Days per week: 0 days    Minutes per session: 0 min  . Stress: Not at all  Relationships  . Social connections    Talks on phone: More than three times a week    Gets together: Once a week    Attends religious service: More than 4 times per year    Active member of club or organization: No    Attends meetings of clubs or organizations: Never    Relationship status: Married  Other Topics Concern  . Not on file  Social History Narrative  . Not on file    Outpatient Encounter Medications as of 12/22/2018  Medication Sig  . albuterol (VENTOLIN HFA) 108 (90 Base) MCG/ACT inhaler Inhale 2 puffs into the lungs every 6 (six) hours as needed for wheezing or shortness of breath.  Marland Kitchen alendronate (FOSAMAX) 70 MG tablet Take 1 tablet (70 mg total) by  mouth once a week.  . Ascorbic Acid (VITAMIN C) 1000 MG tablet Take 500 mg by mouth daily.   . Cholecalciferol (VITAMIN D-1000 MAX ST) 1000 UNITS tablet Take 1,000 Units by mouth daily.   . citalopram (CELEXA) 40 MG tablet Take 1 tablet (40 mg total) by mouth daily.  . Cyanocobalamin (RA VITAMIN B-12 TR) 1000 MCG TBCR Take 1,000 mcg by mouth daily.   . diclofenac sodium (VOLTAREN) 1 % GEL Apply 2 g topically 3 (three) times daily as needed. For hands, arthritis  .  famotidine (PEPCID) 20 MG tablet Take 20 mg by mouth 2 (two) times daily.  . ferrous sulfate 325 (65 FE) MG EC tablet Take 325 mg by mouth at bedtime.   . furosemide (LASIX) 20 MG tablet Take 1 tablet (20 mg total) by mouth daily. (Patient taking differently: Take 20 mg by mouth daily as needed. )  . ipratropium (ATROVENT) 0.02 % nebulizer solution Take 0.5 mg by nebulization 4 (four) times daily.  . methocarbamol (ROBAXIN) 500 MG tablet Take 1 tablet (500 mg total) by mouth every 8 (eight) hours as needed for muscle spasms.  . metoprolol succinate (TOPROL XL) 25 MG 24 hr tablet Take 1 tablet (25 mg total) by mouth daily.  . Multiple Vitamins-Minerals (ONE-A-DAY WOMENS PETITES) TABS Take 2 tablets by mouth every morning.  . Omega-3 Fatty Acids (FISH OIL) 1000 MG CAPS Take 1,000 mg by mouth daily.  . simvastatin (ZOCOR) 40 MG tablet Take 1 tablet (40 mg total) by mouth daily.  . traZODone (DESYREL) 100 MG tablet Take 1 tablet (100 mg total) by mouth at bedtime. May start with half pill for 50mg  nightly for 1-2 weeks then increase to whole pill nightly.  . Pediatric Multiple Vit-C-FA (CHEWABLE VITE CHILDRENS) CHEW Chew by mouth daily. occassionally  . [DISCONTINUED] Bevacizumab 3.75 MG/0.15ML SOSY 1.25 mg once  . [DISCONTINUED] metoprolol tartrate (LOPRESSOR) 25 MG tablet Take 0.5 tablets (12.5 mg total) by mouth 2 (two) times daily. (Patient not taking: Reported on 12/22/2018)   No facility-administered encounter medications on file as of  12/22/2018.     Activities of Daily Living In your present state of health, do you have any difficulty performing the following activities: 12/22/2018  Hearing? Y  Comment hearing aids  Vision? Y  Comment eyeglasses, goes to [facility]. gets shots regularly  Difficulty concentrating or making decisions? N  Walking or climbing stairs? Y  Dressing or bathing? N  Doing errands, shopping? N  Preparing Food and eating ? N  Using the Toilet? N  In the past six months, have you accidently leaked urine? Y  Comment pads for protection  Do you have problems with loss of bowel control? N  Managing your Medications? N  Managing your Finances? N  Housekeeping or managing your Housekeeping? N  Some recent data might be hidden     Patient Care Team: Olin Hauser, DO as PCP - General (Family Medicine) Minna Merritts, MD as PCP - Cardiology (Cardiology)    Assessment:   This is a routine wellness examination for Jenna [name].  Exercise Activities and Dietary recommendations Current Exercise Habits: The patient does not participate in regular exercise at present, Exercise limited by: None identified  Goals   None     Fall Risk: Fall Risk  12/22/2018 11/12/2017  Falls in the past year? 1 Yes  Number falls in past yr: 1 1  Injury with Fall? 1 Yes  Risk for fall due to : Impaired balance/gait -    FALL RISK PREVENTION PERTAINING TO THE HOME:  Any stairs in or around the home? Yes and ramp going inside,  basement  If so, are there any without handrails? No   Home free of loose throw rugs in walkways, pet beds, electrical cords, etc? Yes  Adequate lighting in your home to reduce risk of falls? Yes   ASSISTIVE DEVICES UTILIZED TO PREVENT FALLS:  Life alert? Yes  Use of a cane, walker or w/c? No  Grab bars in the bathroom? Yes  Shower chair or bench in shower? No  Elevated toilet seat or a handicapped toilet? Yes   TIMED UP AND GO:  Unable to perform     Depression Screen PHQ 2/9 Scores 12/22/2018 08/25/2018 02/13/2018 11/13/2017  PHQ - 2 Score 0 3 0 2  PHQ- 9 Score - 10 8 9      Cognitive Function        Immunization History  Administered Date(s) Administered  . Fluad Quad(high Dose 65+) 11/06/2018  . Influenza, High Dose Seasonal PF 10/29/2017  . Influenza-Unspecified 10/27/2017  . Pneumococcal Conjugate-13 11/01/2015  . Pneumococcal Polysaccharide-23 09/04/2017  . Td 09/24/2017  . Zoster Recombinat (Shingrix) 11/29/2016, 02/05/2017    Qualifies for Shingles Vaccine? Completed shingrix   Tdap: Discussed need for TD/TDAP vaccine, patient verbalized understanding that this is not covered as a preventative with there insurance and to call the office if she develops any new skin injuries, ie: cuts, scrapes, bug bites, or open wounds.  Flu Vaccine: up to date   Pneumococcal Vaccine: up to date   Screening Tests Health Maintenance  Topic Date Due  . TETANUS/TDAP  12/22/2019 (Originally 05/23/1954)  . HEMOGLOBIN A1C  02/25/2019  . FOOT EXAM  08/25/2019  . URINE MICROALBUMIN  08/25/2019  . OPHTHALMOLOGY EXAM  10/13/2019  . INFLUENZA VACCINE  Completed  . DEXA SCAN  Completed  . PNA vac Low Risk Adult  Completed    Cancer Screenings:  Colorectal Screening: no longer required   Mammogram: no longer required   Bone Density: Completed 2019  Lung Cancer Screening: (Low Dose CT Chest recommended if Age 62-80 years, 30 pack-year currently smoking OR have quit w/in 15years.) does not qualify.    Additional Screening:  Hepatitis C Screening: does not qualify  Dental Screening: Recommended annual dental exams for proper oral hygiene   Community Resource Referral:  CRR required this visit?  No       Plan:  I have personally reviewed and addressed the Medicare Annual Wellness questionnaire and have noted the following in the patient's chart:  A. Medical and social history B. Use of alcohol, tobacco or illicit drugs  C.  Current medications and supplements D. Functional ability and status E.  Nutritional status F.  Physical activity G. Advance directives H. List of other physicians I.  Hospitalizations, surgeries, and ER visits in previous 12 months J.  [place] such as hearing and vision if needed, cognitive and depression L. Referrals and appointments   In addition, I have reviewed and discussed with patient certain preventive protocols, quality metrics, and best practice recommendations. A written personalized care plan for preventive services as well as general preventive health recommendations were provided to patient.   Signed,    Bevelyn Ngo, LPN  [id number] Nurse Health Advisor   Nurse Notes: none

## 2018-12-30 DIAGNOSIS — J432 Centrilobular emphysema: Secondary | ICD-10-CM | POA: Diagnosis not present

## 2018-12-30 DIAGNOSIS — R0600 Dyspnea, unspecified: Secondary | ICD-10-CM | POA: Diagnosis not present

## 2019-01-19 ENCOUNTER — Other Ambulatory Visit: Payer: Self-pay | Admitting: Family Medicine

## 2019-01-19 DIAGNOSIS — M85852 Other specified disorders of bone density and structure, left thigh: Secondary | ICD-10-CM

## 2019-01-19 MED ORDER — ALENDRONATE SODIUM 70 MG PO TABS: 70.0000 mg | ORAL_TABLET | ORAL | 1 refills | Status: DC

## 2019-01-25 ENCOUNTER — Other Ambulatory Visit: Payer: Self-pay

## 2019-01-25 ENCOUNTER — Other Ambulatory Visit: Payer: Medicare Other

## 2019-01-25 DIAGNOSIS — E1169 Type 2 diabetes mellitus with other specified complication: Secondary | ICD-10-CM

## 2019-01-25 DIAGNOSIS — E1121 Type 2 diabetes mellitus with diabetic nephropathy: Secondary | ICD-10-CM | POA: Diagnosis not present

## 2019-01-25 DIAGNOSIS — F331 Major depressive disorder, recurrent, moderate: Secondary | ICD-10-CM

## 2019-01-25 DIAGNOSIS — R7989 Other specified abnormal findings of blood chemistry: Secondary | ICD-10-CM | POA: Diagnosis not present

## 2019-01-25 DIAGNOSIS — E785 Hyperlipidemia, unspecified: Secondary | ICD-10-CM

## 2019-01-25 DIAGNOSIS — Z0000 Encounter for general adult medical examination without abnormal findings: Secondary | ICD-10-CM

## 2019-01-25 DIAGNOSIS — F5104 Psychophysiologic insomnia: Secondary | ICD-10-CM

## 2019-01-26 LAB — CBC WITH DIFFERENTIAL/PLATELET
Absolute Monocytes: 494 cells/uL (ref 200–950)
Basophils Absolute: 43 cells/uL (ref 0–200)
Basophils Relative: 0.7 %
Eosinophils Absolute: 140 cells/uL (ref 15–500)
Eosinophils Relative: 2.3 %
HCT: 37.3 % (ref 35.0–45.0)
Hemoglobin: 12.5 g/dL (ref 11.7–15.5)
Lymphs Abs: 1867 cells/uL (ref 850–3900)
MCH: 32.1 pg (ref 27.0–33.0)
MCHC: 33.5 g/dL (ref 32.0–36.0)
MCV: 95.9 fL (ref 80.0–100.0)
MPV: 11 fL (ref 7.5–12.5)
Monocytes Relative: 8.1 %
Neutro Abs: 3556 cells/uL (ref 1500–7800)
Neutrophils Relative %: 58.3 %
Platelets: 215 10*3/uL (ref 140–400)
RBC: 3.89 10*6/uL (ref 3.80–5.10)
RDW: 12 % (ref 11.0–15.0)
Total Lymphocyte: 30.6 %
WBC: 6.1 10*3/uL (ref 3.8–10.8)

## 2019-01-26 LAB — COMPLETE METABOLIC PANEL WITH GFR
AG Ratio: 1.7 (calc) (ref 1.0–2.5)
ALT: 35 U/L — ABNORMAL HIGH (ref 6–29)
AST: 28 U/L (ref 10–35)
Albumin: 3.7 g/dL (ref 3.6–5.1)
Alkaline phosphatase (APISO): 74 U/L (ref 37–153)
BUN: 13 mg/dL (ref 7–25)
CO2: 25 mmol/L (ref 20–32)
Calcium: 8.6 mg/dL (ref 8.6–10.4)
Chloride: 106 mmol/L (ref 98–110)
Creat: 0.81 mg/dL (ref 0.60–0.88)
GFR, Est African American: 79 mL/min/{1.73_m2} (ref >=60)
GFR, Est Non African American: 68 mL/min/{1.73_m2} (ref >=60)
Globulin: 2.2 g/dL (calc) (ref 1.9–3.7)
Glucose, Bld: 143 mg/dL — ABNORMAL HIGH (ref 65–99)
Potassium: 3.6 mmol/L (ref 3.5–5.3)
Sodium: 141 mmol/L (ref 135–146)
Total Bilirubin: 0.5 mg/dL (ref 0.2–1.2)
Total Protein: 5.9 g/dL — ABNORMAL LOW (ref 6.1–8.1)

## 2019-01-26 LAB — LIPID PANEL
Cholesterol: 137 mg/dL (ref <200)
HDL: 75 mg/dL (ref >=50)
LDL Cholesterol (Calc): 44 mg/dL (calc)
Non-HDL Cholesterol (Calc): 62 mg/dL (calc) (ref <130)
Total CHOL/HDL Ratio: 1.8 (calc) (ref <5.0)
Triglycerides: 99 mg/dL (ref <150)

## 2019-01-26 LAB — TSH: TSH: 4.48 mIU/L (ref 0.40–4.50)

## 2019-01-26 LAB — HEMOGLOBIN A1C
Hgb A1c MFr Bld: 6.1 % of total Hgb — ABNORMAL HIGH (ref <5.7)
Mean Plasma Glucose: 128 (calc)
eAG (mmol/L): 7.1 (calc)

## 2019-01-26 LAB — T4, FREE: Free T4: 1.2 ng/dL (ref 0.8–1.8)

## 2019-02-01 ENCOUNTER — Encounter: Payer: Self-pay | Admitting: Family Medicine

## 2019-02-01 ENCOUNTER — Ambulatory Visit (INDEPENDENT_AMBULATORY_CARE_PROVIDER_SITE_OTHER): Payer: Medicare Other | Admitting: Family Medicine

## 2019-02-01 ENCOUNTER — Other Ambulatory Visit: Payer: Self-pay

## 2019-02-01 VITALS — BP 138/80 | HR 60 | Temp 97.3°F | Resp 16 | Ht 63.0 in | Wt 201.0 lb

## 2019-02-01 DIAGNOSIS — E669 Obesity, unspecified: Secondary | ICD-10-CM

## 2019-02-01 DIAGNOSIS — I471 Supraventricular tachycardia, unspecified: Secondary | ICD-10-CM | POA: Insufficient documentation

## 2019-02-01 DIAGNOSIS — Z0000 Encounter for general adult medical examination without abnormal findings: Secondary | ICD-10-CM

## 2019-02-01 DIAGNOSIS — M159 Polyosteoarthritis, unspecified: Secondary | ICD-10-CM

## 2019-02-01 DIAGNOSIS — M8949 Other hypertrophic osteoarthropathy, multiple sites: Secondary | ICD-10-CM

## 2019-02-01 DIAGNOSIS — E1121 Type 2 diabetes mellitus with diabetic nephropathy: Secondary | ICD-10-CM | POA: Diagnosis not present

## 2019-02-01 DIAGNOSIS — E1169 Type 2 diabetes mellitus with other specified complication: Secondary | ICD-10-CM

## 2019-02-01 DIAGNOSIS — F331 Major depressive disorder, recurrent, moderate: Secondary | ICD-10-CM

## 2019-02-01 DIAGNOSIS — J432 Centrilobular emphysema: Secondary | ICD-10-CM | POA: Diagnosis not present

## 2019-02-01 DIAGNOSIS — E785 Hyperlipidemia, unspecified: Secondary | ICD-10-CM

## 2019-02-01 NOTE — Assessment & Plan Note (Signed)
Stable chronic problem with mood, anxiety, secondary insomnia Persistent w/ life stressors family related still  Plan Encourage continue on current med Citalopram 40mg  daily Off trazodone F/u as needed

## 2019-02-01 NOTE — Assessment & Plan Note (Signed)
Weight stable Encourage lifestyle diet exercise 

## 2019-02-01 NOTE — Assessment & Plan Note (Signed)
Well controlled DM2 w/ A1c 6.1 No hypoglycemia Complications - CKD-III stable, other including hyperlipidemia, GERD, depression, obesity - increases risk of future cardiovascular complications / poor glucose control due to reduced lifestyle diet/exercise with low energy mood and fatigue  Plan:  1. Remain diet controlled, off med - now starting Keto diet 2. Encourage improved lifestyle - low carb, low sugar diet, reduce portion size, continue improving regular exercise 3. Check CBG , bring log to next visit for review 4. Continue Statin = future consider low dose ACEi/ARB

## 2019-02-01 NOTE — Assessment & Plan Note (Signed)
Controlled. Stable without flare up °On medication management °Followed by Cardiology °

## 2019-02-01 NOTE — Assessment & Plan Note (Signed)
Stable, without exacerbation Former smoker On [place] (high cost however) Albuterol PRN

## 2019-02-01 NOTE — Patient Instructions (Addendum)
Thank you for coming to the office today.  Recommend COVID19 vaccine at [facility] age 84  Keep up the good work, plan on [employer] as you are.  Keep track of BP  No changes today  Recent Labs    02/06/18 0853 08/25/18 1347 01/25/19 0828  HGBA1C 6.2* 6.4* 6.1*     Please schedule a Follow-up Appointment to: Return in about 6 months (around 08/01/2019) for 6 month DM A1c.  If you have any other questions or concerns, please feel free to call the office or send a message through [place]. You may also schedule an earlier appointment if necessary.  Additionally, you may be receiving a survey about your experience at our office within a few days to 1 week by e-mail or mail. We value your feedback.  Nobie Putnam, DO [place]

## 2019-02-01 NOTE — Assessment & Plan Note (Signed)
Controlled cholesterol on statin and lifestyle Last lipid panel 01/2019  Plan: 1. Continue current meds - Simvastatin 40mg  daily 2. Encourage improved lifestyle - low carb/cholesterol, reduce portion size, continue improving regular exercise

## 2019-02-01 NOTE — Progress Notes (Signed)
Subjective:    Patient ID: Jenna [name], female    DOB: [birth year], 84 y.o.   MRN: [id number]  Jenna [name] is a 84 y.o. female presenting on 02/01/2019 for Annual Exam   HPI   Here for Annual Physical and Lab Review.  CHRONIC DM, Type 2, w/ nephropathy / CKD-III Previous results A1c 6.1-6.2 Today A1c 6.4 No new concerns but admits some reduced adherence to diet. She plans to start Keto diet now. Meds:None Currentlynot onACEi / ARB Lifestyle - Weight +/- 1 lb in 6 months - Diet: Started Keto diet in the past 1 week  - Exercise; Limited regular exercise but working on improving Followed by [facility] - frequently for injections in eyes for diabetes, updated last exam 09/2018, has upcoming injection soon. Denies hypoglycemia, polyuria, visual changes, numbness or tingling.  Insomnia/ Chronic Depression, recurrent / Anxiety Reviewed last visit with background on depression. She now reports primary life stressor involves herdaughter lives withher and her husband. Causing financial stress among other issues. - Currently doing fairly well on current dose Citalopram 40mg  daily   Additional updates Paroxsymal tachycardia - Followed by Cardiology Dr Rockey Situ next apt 05/2019  Health Maintenance: UTD PNA, Flu vaccines  Depression screen [facility] 2/9 02/01/2019 02/01/2019 12/22/2018  Decreased Interest 1 1 0  Down, Depressed, Hopeless 0 0 0  PHQ - 2 Score 1 1 0  Altered sleeping 2 2 -  Tired, decreased energy 1 1 -  Change in appetite 0 0 -  Feeling bad or failure about yourself  0 0 -  Trouble concentrating 0 0 -  Moving slowly or fidgety/restless 0 0 -  Suicidal thoughts 0 0 -  PHQ-9 Score 4 4 -  Difficult doing work/chores Somewhat difficult Somewhat difficult -    Past Medical History:  Diagnosis Date  . Anxiety   . Arthritis    neck, knees(before replacements)  . Asthma   . CKD (chronic kidney disease), stage III   . COPD (chronic obstructive  pulmonary disease) ([place])   . Encounter for colonoscopy due to history of adenomatous colonic polyps   . GERD (gastroesophageal reflux disease)   . Hearing loss of both ears   . History of echocardiogram    a. 10/2013 Echo: EF 60-65%, no rwma, midlly dil LA. Rnl RV fxn.  . Hyperlipidemia   . Osteoporosis   . Palpitations   . PSVT (paroxysmal supraventricular tachycardia) ([place])    a. 05/2018 [facility]: 99 SVT runs. Fastest 218 x 4:30. Longest 4:38 w/ rate of 172.  . Sleep apnea    resolved with gastric bypass  . Syncope and collapse   . Urine incontinence    Past Surgical History:  Procedure Laterality Date  . BROW LIFT Bilateral 06/27/2015   Procedure: BLEPHAROPLASTY BILATERAL UPPER EYELIDS BILATERAL BLEPHAROTOSIS EYELIDS;  Surgeon: Karle Starch, MD;  Location: [facility];  Service: Ophthalmology;  Laterality: Bilateral;  BILATERAL  . CATARACT EXTRACTION W/PHACO Right 02/13/2015   Procedure: CATARACT EXTRACTION PHACO AND INTRAOCULAR LENS PLACEMENT (IOC);  Surgeon: Ronnell Freshwater, MD;  Location: [facility];  Service: Ophthalmology;  Laterality: Right;  . CATARACT EXTRACTION W/PHACO Left 03/22/2015   Procedure: CATARACT EXTRACTION PHACO AND INTRAOCULAR LENS PLACEMENT (IOC);  Surgeon: Ronnell Freshwater, MD;  Location: [facility];  Service: Ophthalmology;  Laterality: Left;  TORIC  . CHOLECYSTECTOMY  1984  . COLECTOMY    . COSMETIC SURGERY  2012   tummy tuck and excess skin  removal  . GASTRIC BYPASS  2010  . REPLACEMENT TOTAL KNEE BILATERAL  1998  . SHOULDER SURGERY  2009   left   . TONSILLECTOMY    . TOTAL ABDOMINAL HYSTERECTOMY  1979   Social History   Socioeconomic History  . Marital status: Married    Spouse name: Not on file  . Number of children: Not on file  . Years of education: Teacher, English as a foreign language   . Highest education level: High school graduate  Occupational History  . Not on file  Tobacco Use  . Smoking status: Former  Smoker    Packs/day: 1.00    Years: 20.00    Pack years: 20.00    Types: Cigarettes    Quit date: 07/1981    Years since quitting: 37.5  . Smokeless tobacco: Former Network engineer and Sexual Activity  . Alcohol use: Not Currently    Comment: past  . Drug use: No  . Sexual activity: Not on file  Other Topics Concern  . Not on file  Social History Narrative  . Not on file   Social Determinants of Health   Financial Resource Strain: Low Risk   . Difficulty of Paying Living Expenses: Not very hard  Food Insecurity: No Food Insecurity  . Worried About Charity fundraiser in the Last Year: Never true  . Ran Out of Food in the Last Year: Never true  Transportation Needs: No Transportation Needs  . Lack of Transportation (Medical): No  . Lack of Transportation (Non-Medical): No  Physical Activity: Inactive  . Days of Exercise per Week: 0 days  . Minutes of Exercise per Session: 0 min  Stress: No Stress Concern Present  . Feeling of Stress : Not at all  Social Connections: Slightly Isolated  . Frequency of Communication with Friends and Family: More than three times a week  . Frequency of Social Gatherings with Friends and Family: Once a week  . Attends Religious Services: More than 4 times per year  . Active Member of Clubs or Organizations: No  . Attends Archivist Meetings: Never  . Marital Status: Married  Human resources officer Violence: Not At Risk  . Fear of Current or Ex-Partner: No  . Emotionally Abused: No  . Physically Abused: No  . Sexually Abused: No   Family History  Problem Relation Age of Onset  . Heart attack Mother   . Hypertension Mother   . Diabetes type II Mother   . Pneumonia Father   . Skin cancer Father   . Diabetes type II Father   . Colon cancer Sister   . Ovarian cancer Sister   . Heart attack Brother   . Heart attack Sister 10  . Hyperlipidemia Sister   . Hypertension Sister    Current Outpatient Medications on File Prior to Visit    Medication Sig  . albuterol (VENTOLIN HFA) 108 (90 Base) MCG/ACT inhaler Inhale 2 puffs into the lungs every 6 (six) hours as needed for wheezing or shortness of breath.  Marland Kitchen alendronate (FOSAMAX) 70 MG tablet Take 1 tablet (70 mg total) by mouth once a week.  . citalopram (CELEXA) 40 MG tablet Take 1 tablet (40 mg total) by mouth daily.  . diclofenac sodium (VOLTAREN) 1 % GEL Apply 2 g topically 3 (three) times daily as needed. For hands, arthritis  . famotidine (PEPCID) 20 MG tablet Take 20 mg by mouth 2 (two) times daily.  . ferrous sulfate 325 (65 FE) MG EC  tablet Take 325 mg by mouth at bedtime.   . furosemide (LASIX) 20 MG tablet Take 1 tablet (20 mg total) by mouth daily. (Patient taking differently: Take 20 mg by mouth daily as needed. )  . ipratropium (ATROVENT) 0.02 % nebulizer solution Take 0.5 mg by nebulization 4 (four) times daily.  . methocarbamol (ROBAXIN) 500 MG tablet Take 1 tablet (500 mg total) by mouth every 8 (eight) hours as needed for muscle spasms.  . metoprolol succinate (TOPROL XL) 25 MG 24 hr tablet Take 1 tablet (25 mg total) by mouth daily.  . Multiple Vitamins-Minerals (ONE-A-DAY WOMENS PETITES) TABS Take 2 tablets by mouth every morning.  . Omega-3 Fatty Acids (FISH OIL) 1000 MG CAPS Take 1,000 mg by mouth daily.  . Pediatric Multiple Vit-C-FA (CHEWABLE VITE CHILDRENS) CHEW Chew by mouth daily. occassionally  . simvastatin (ZOCOR) 40 MG tablet Take 1 tablet (40 mg total) by mouth daily.  . Ascorbic Acid (VITAMIN C) 1000 MG tablet Take 500 mg by mouth daily.   Marland Kitchen BREO ELLIPTA 100-25 MCG/INH AEPB 1 puff daily.  . Cholecalciferol (VITAMIN D-1000 MAX ST) 1000 UNITS tablet Take 1,000 Units by mouth daily.   . Cyanocobalamin (RA VITAMIN B-12 TR) 1000 MCG TBCR Take 1,000 mcg by mouth daily.    No current facility-administered medications on file prior to visit.    Review of Systems  Constitutional: Negative for activity change, appetite change, chills, diaphoresis,  fatigue and fever.  HENT: Negative for congestion and hearing loss.   Eyes: Negative for visual disturbance.  Respiratory: Negative for apnea, cough, chest tightness, shortness of breath and wheezing.   Cardiovascular: Negative for chest pain, palpitations and leg swelling.  Gastrointestinal: Negative for abdominal pain, anal bleeding, blood in stool, constipation, diarrhea, nausea and vomiting.  Endocrine: Negative for cold intolerance.  Genitourinary: Negative for difficulty urinating, dysuria, frequency and hematuria.  Musculoskeletal: Negative for arthralgias, back pain and neck pain.  Skin: Negative for rash.  Allergic/Immunologic: Negative for environmental allergies.  Neurological: Negative for dizziness, weakness, light-headedness, numbness and headaches.  Hematological: Negative for adenopathy.  Psychiatric/Behavioral: Negative for behavioral problems, dysphoric mood and sleep disturbance. The patient is not nervous/anxious.    Per HPI unless specifically indicated above     Objective:    BP 138/80 (BP Location: Left Arm, Cuff Size: Normal)   Pulse 60   Temp (!) 97.3 F (36.3 C) (Oral)   Resp 16   Ht 5\' 3"  (1.6 m)   Wt 201 lb (91.2 kg)   BMI 35.61 kg/m   Wt Readings from Last 3 Encounters:  02/01/19 201 lb (91.2 kg)  12/22/18 194 lb (88 kg)  11/25/18 200 lb (90.7 kg)    Physical Exam Vitals and nursing note reviewed.  Constitutional:      General: She is not in acute distress.    Appearance: She is well-developed. She is not diaphoretic.     Comments: Well-appearing, comfortable, cooperative  HENT:     Head: Normocephalic and atraumatic.  Eyes:     General:        Right eye: No discharge.        Left eye: No discharge.     Conjunctiva/sclera: Conjunctivae normal.     Pupils: Pupils are equal, round, and reactive to light.  Neck:     Thyroid: No thyromegaly.  Cardiovascular:     Rate and Rhythm: Normal rate and regular rhythm.     Heart sounds: Normal heart  sounds. No murmur.  Pulmonary:  Effort: Pulmonary effort is normal. No respiratory distress.     Breath sounds: Normal breath sounds. No wheezing or rales.  Abdominal:     General: Bowel sounds are normal. There is no distension.     Palpations: Abdomen is soft. There is no mass.     Tenderness: There is no abdominal tenderness.  Musculoskeletal:        General: No tenderness. Normal range of motion.     Cervical back: Normal range of motion and neck supple.     Right lower leg: Edema present.     Left lower leg: Edema present.     Comments: Upper / Lower Extremities: - Normal muscle tone, strength bilateral upper extremities 5/5, lower extremities 5/5  Lymphadenopathy:     Cervical: No cervical adenopathy.  Skin:    General: Skin is warm and dry.     Findings: No erythema or rash.  Neurological:     Mental Status: She is alert and oriented to person, place, and time.     Comments: Distal sensation intact to light touch all extremities  Psychiatric:        Behavior: Behavior normal.     Comments: Well groomed, good eye contact, normal speech and thoughts        Results for orders placed or performed in visit on 01/25/19  TSH  Result Value Ref Range   TSH 4.48 0.40 - 4.50 mIU/L  T4, free  Result Value Ref Range   Free T4 1.2 0.8 - 1.8 ng/dL  Lipid panel  Result Value Ref Range   Cholesterol 137 <200 mg/dL   HDL 75 > OR = 50 mg/dL   Triglycerides 99 <150 mg/dL   LDL Cholesterol (Calc) 44 mg/dL (calc)   Total CHOL/HDL Ratio 1.8 <5.0 (calc)   Non-HDL Cholesterol (Calc) 62 <130 mg/dL (calc)  COMPLETE METABOLIC PANEL WITH GFR  Result Value Ref Range   Glucose, Bld 143 (H) 65 - 99 mg/dL   BUN 13 7 - 25 mg/dL   Creat 0.81 0.60 - 0.88 mg/dL   GFR, Est Non African American 68 > OR = 60 mL/min/1.6m2   GFR, Est African American 79 > OR = 60 mL/min/1.71m2   BUN/Creatinine Ratio NOT APPLICABLE 6 - 22 (calc)   Sodium 141 135 - 146 mmol/L   Potassium 3.6 3.5 - 5.3 mmol/L    Chloride 106 98 - 110 mmol/L   CO2 25 20 - 32 mmol/L   Calcium 8.6 8.6 - 10.4 mg/dL   Total Protein 5.9 (L) 6.1 - 8.1 g/dL   Albumin 3.7 3.6 - 5.1 g/dL   Globulin 2.2 1.9 - 3.7 g/dL (calc)   AG Ratio 1.7 1.0 - 2.5 (calc)   Total Bilirubin 0.5 0.2 - 1.2 mg/dL   Alkaline phosphatase (APISO) 74 37 - 153 U/L   AST 28 10 - 35 U/L   ALT 35 (H) 6 - 29 U/L  CBC with Differential/Platelet  Result Value Ref Range   WBC 6.1 3.8 - 10.8 Thousand/uL   RBC 3.89 3.80 - 5.10 Million/uL   Hemoglobin 12.5 11.7 - 15.5 g/dL   HCT 37.3 35.0 - 45.0 %   MCV 95.9 80.0 - 100.0 fL   MCH 32.1 27.0 - 33.0 pg   MCHC 33.5 32.0 - 36.0 g/dL   RDW 12.0 11.0 - 15.0 %   Platelets 215 140 - 400 Thousand/uL   MPV 11.0 7.5 - 12.5 fL   Neutro Abs 3,556 1,500 - 7,800 cells/uL  Lymphs Abs 1,867 850 - 3,900 cells/uL   Absolute Monocytes 494 200 - 950 cells/uL   Eosinophils Absolute 140 15 - 500 cells/uL   Basophils Absolute 43 0 - 200 cells/uL   Neutrophils Relative % 58.3 %   Total Lymphocyte 30.6 %   Monocytes Relative 8.1 %   Eosinophils Relative 2.3 %   Basophils Relative 0.7 %  Hemoglobin A1c  Result Value Ref Range   Hgb A1c MFr Bld 6.1 (H) <5.7 % of total Hgb   Mean Plasma Glucose 128 (calc)   eAG (mmol/L) 7.1 (calc)      Assessment & Plan:   Problem List Items Addressed This Visit    PSVT (paroxysmal supraventricular tachycardia) ([place])    Controlled. Stable without flare up On medication management Followed by Cardiology      Primary osteoarthritis involving multiple joints   Obesity (BMI 35.0-39.9 without comorbidity)    Weight stable Encourage lifestyle diet exercise      Hyperlipidemia associated with type 2 diabetes mellitus ([place])    Controlled cholesterol on statin and lifestyle Last lipid panel 01/2019  Plan: 1. Continue current meds - Simvastatin 40mg  daily 2. Encourage improved lifestyle - low carb/cholesterol, reduce portion size, continue improving regular exercise        Depression, major, recurrent (HCC)    Stable chronic problem with mood, anxiety, secondary insomnia Persistent w/ life stressors family related still  Plan Encourage continue on current med Citalopram 40mg  daily Off trazodone F/u as needed      Controlled type 2 diabetes mellitus with diabetic nephropathy, without long-term current use of insulin (HCC)    Well controlled DM2 w/ A1c 6.1 No hypoglycemia Complications - CKD-III stable, other including hyperlipidemia, GERD, depression, obesity - increases risk of future cardiovascular complications / poor glucose control due to reduced lifestyle diet/exercise with low energy mood and fatigue  Plan:  1. Remain diet controlled, off med - now starting Keto diet 2. Encourage improved lifestyle - low carb, low sugar diet, reduce portion size, continue improving regular exercise 3. Check CBG , bring log to next visit for review 4. Continue Statin = future consider low dose ACEi/ARB      Centrilobular emphysema (HCC)    Stable, without exacerbation Former smoker On Breo (high cost however) Albuterol PRN      Relevant Medications   BREO ELLIPTA 100-25 MCG/INH AEPB    Other Visit Diagnoses    Annual physical exam    -  Primary      Updated Health Maintenance information Reviewed recent lab results with patient Encouraged improvement to lifestyle with diet and exercise - Goal of weight loss   No orders of the defined types were placed in this encounter.   Follow up plan: Return in about 6 months (around 08/01/2019) for 6 month DM A1c.  Nobie Putnam, [facility] 02/01/2019, 1:39 PM

## 2019-02-09 DIAGNOSIS — H35352 Cystoid macular degeneration, left eye: Secondary | ICD-10-CM | POA: Diagnosis not present

## 2019-03-10 ENCOUNTER — Other Ambulatory Visit: Payer: Self-pay | Admitting: Family Medicine

## 2019-03-10 DIAGNOSIS — F5104 Psychophysiologic insomnia: Secondary | ICD-10-CM

## 2019-03-10 MED ORDER — TRAZODONE HCL 50 MG PO TABS: 25.0000 mg | ORAL_TABLET | Freq: Every evening | ORAL | 3 refills | Status: DC | PRN

## 2019-03-11 ENCOUNTER — Telehealth: Payer: Self-pay

## 2019-03-11 DIAGNOSIS — F331 Major depressive disorder, recurrent, moderate: Secondary | ICD-10-CM

## 2019-03-11 MED ORDER — CITALOPRAM HYDROBROMIDE 40 MG PO TABS: 40.0000 mg | ORAL_TABLET | Freq: Every day | ORAL | 1 refills | Status: DC

## 2019-03-11 NOTE — Telephone Encounter (Signed)
[facility] sent a refill request for patients:  Citalopram 40mg   #90  1 tablet daily

## 2019-03-12 ENCOUNTER — Telehealth: Payer: Self-pay | Admitting: Family Medicine

## 2019-03-12 NOTE — Chronic Care Management (AMB) (Signed)
  Chronic Care Management   Note  01/30/2109 Name: Dacota Devall MRN: [id number] DOB: [birth year]  Haisley Arens Carignan is a 84 y.o. year old female who is a primary care patient of Olin Hauser, DO. I reached out to Daisy by phone today in response to a referral sent by Ms. Gwenlyn Found Taborn's health plan.     Ms. Jagoda was given information about Chronic Care Management services today including:  1. CCM service includes personalized support from designated clinical staff supervised by her physician, including individualized plan of care and coordination with other care providers 2. 24/7 contact phone numbers for assistance for urgent and routine care needs. 3. Service will only be billed when office clinical staff spend 20 minutes or more in a month to coordinate care. 4. Only one practitioner may furnish and bill the service in a calendar month. 5. The patient may stop CCM services at any time (effective at the end of the month) by phone call to the office staff. 6. The patient will be responsible for cost sharing (co-pay) of up to 20% of the service fee (after annual deductible is met).  Patient agreed to services and verbal consent obtained.   Follow up plan: Telephone appointment with care management team member scheduled for:04/26/2019  Noreene Larsson, [address] [place], [place], [place] [address] Direct Dial: [phone] [email or web address]: [email or web address]

## 2019-03-16 DIAGNOSIS — M170 Bilateral primary osteoarthritis of knee: Secondary | ICD-10-CM | POA: Diagnosis not present

## 2019-03-16 DIAGNOSIS — Z96653 Presence of artificial knee joint, bilateral: Secondary | ICD-10-CM | POA: Diagnosis not present

## 2019-03-29 ENCOUNTER — Other Ambulatory Visit: Payer: Self-pay | Admitting: Family Medicine

## 2019-03-29 DIAGNOSIS — F5104 Psychophysiologic insomnia: Secondary | ICD-10-CM

## 2019-03-29 MED ORDER — TRAZODONE HCL 50 MG PO TABS: 50.0000 mg | ORAL_TABLET | Freq: Every day | ORAL | 5 refills | Status: DC

## 2019-04-06 DIAGNOSIS — H35352 Cystoid macular degeneration, left eye: Secondary | ICD-10-CM | POA: Diagnosis not present

## 2019-04-26 ENCOUNTER — Telehealth: Payer: Medicare Other

## 2019-04-26 ENCOUNTER — Ambulatory Visit: Payer: Self-pay | Admitting: General Practice

## 2019-04-26 NOTE — Chronic Care Management (AMB) (Signed)
°  Chronic Care Management   Outreach Note  [id number] Name: Jenna [name] MRN: [id number] DOB: 1935/11/23  Referred by: Olin Hauser, DO Reason for referral : Chronic Care Management (Initial outreach for Chronic Disease Management and Care Coordination Needs )   An unsuccessful telephone outreach was attempted today. The patient was referred to the case management team for assistance with care management and care coordination.   Follow Up Plan: A HIPPA compliant phone message was left for the patient providing contact information and requesting a return call.   Noreene Larsson RN, MSN, [place] Mobile: [phone]

## 2019-05-06 ENCOUNTER — Other Ambulatory Visit: Payer: Self-pay | Admitting: Family Medicine

## 2019-05-06 DIAGNOSIS — R600 Localized edema: Secondary | ICD-10-CM

## 2019-05-06 DIAGNOSIS — E1169 Type 2 diabetes mellitus with other specified complication: Secondary | ICD-10-CM

## 2019-05-06 MED ORDER — FUROSEMIDE 20 MG PO TABS: 20.0000 mg | ORAL_TABLET | Freq: Every day | ORAL | 1 refills | Status: DC

## 2019-05-06 MED ORDER — SIMVASTATIN 40 MG PO TABS: 40.0000 mg | ORAL_TABLET | Freq: Every day | ORAL | 1 refills | Status: DC

## 2019-05-18 DIAGNOSIS — H35352 Cystoid macular degeneration, left eye: Secondary | ICD-10-CM | POA: Diagnosis not present

## 2019-05-24 ENCOUNTER — Telehealth: Payer: Medicare Other | Admitting: General Practice

## 2019-05-24 ENCOUNTER — Ambulatory Visit (INDEPENDENT_AMBULATORY_CARE_PROVIDER_SITE_OTHER): Payer: Medicare Other | Admitting: General Practice

## 2019-05-24 DIAGNOSIS — E785 Hyperlipidemia, unspecified: Secondary | ICD-10-CM | POA: Diagnosis not present

## 2019-05-24 DIAGNOSIS — M19042 Primary osteoarthritis, left hand: Secondary | ICD-10-CM

## 2019-05-24 DIAGNOSIS — I471 Supraventricular tachycardia, unspecified: Secondary | ICD-10-CM

## 2019-05-24 DIAGNOSIS — J432 Centrilobular emphysema: Secondary | ICD-10-CM | POA: Diagnosis not present

## 2019-05-24 DIAGNOSIS — R296 Repeated falls: Secondary | ICD-10-CM

## 2019-05-24 DIAGNOSIS — E1169 Type 2 diabetes mellitus with other specified complication: Secondary | ICD-10-CM | POA: Diagnosis not present

## 2019-05-24 DIAGNOSIS — M19041 Primary osteoarthritis, right hand: Secondary | ICD-10-CM | POA: Diagnosis not present

## 2019-05-24 NOTE — Patient Instructions (Signed)
Visit Information  Goals Addressed            This Visit's Progress    RNCM: Pt-"I have had a lot of falls and I have to be careful" (pt-stated)       CARE PLAN ENTRY (see longitudinal plan of care for additional care plan information)  Current Barriers:   Knowledge Deficits related to frequent falls related to knee replacement surgery and osteoarthritis   Chronic Disease Management support and education needs related to osteoarthritis and balance issues in a patient with frequent falls related to knee replacement   Nurse Case Manager Clinical Goal(s):   Over the next 120 days, patient will verbalize understanding of plan for fall prevention and using caution when ambulating  Over the next 120 days, patient will work with [facility] and pcp to address needs related to balance concerns in patient with former knee replacement surgery and poor balance since surgery putting the patient at an increase risk for falls  Over the next 120 days, patient will demonstrate a decrease in fall exacerbations as evidenced by no new falls and no injuries related to falls  Over the next 120 days, patient will attend all scheduled medical appointments: follow up with pcp in December and other providers prn  Over the next 120 days, patient will demonstrate improved health management independence as evidenced byno new falls related to impaired mobility and balance  Interventions:   Inter-disciplinary care team collaboration (see longitudinal plan of care)  Evaluation of current treatment plan related to fall precautions and patient's adherence to plan as established by provider.  Advised patient to report any falls to the [facility] or pcp  Provided education to patient re: safety in the home: o Changing position slowly o Being safe when ambulating- the patient does not use a cane or a walker when ambulating, states she would fall over it o Removing scatter rugs o Having well lite hallways and  surroundings o Wearing non-slip shoes or socks when ambulating  Discussed plans with patient for ongoing care management follow up and provided patient with direct contact information for care management team  Patient Self Care Activities:   Patient verbalizes understanding of plan to work with the CCM team to meet her health and wellness goals  Self administers medications as prescribed  Attends all scheduled provider appointments  Performs IADL's independently  Calls provider office for new concerns or questions  Unable to independently manage falls in her home environment  Initial goal documentation      RNCM: Pt-"I have several health problems" (pt-stated)       CARE PLAN ENTRY (see longtitudinal plan of care for additional care plan information)  Current Barriers:   Chronic Disease Management support, education, and care coordination needs related to HLD, COPD, Osteoarthritis and PSVT  Clinical Goal(s) related to HLD, COPD, Osteoarthritis and PSVT:  Over the next 120 days, patient will:   Work with the care management team to address educational, disease management, and care coordination needs   Begin or continue self health monitoring activities as directed today adhere to heart healthy diet and practice safety in ambulation to prevent falls  Call provider office for new or worsened signs and symptoms Blood pressure findings outside established parameters, Oxygen saturation lower than established parameter, Chest pain, Shortness of breath, and New or worsened symptom related to COPD, Osteoarthritis or other chronic conditions.   Call care management team with questions or concerns  Verbalize basic understanding of patient centered plan of  care established today  Interventions related to HLD, COPD, Osteoarthritis and PSVT:   Evaluation of current treatment plans and patient's adherence to plan as established by provider.  The patient feels she has good control of her  chronic conditions at this time  Assessed patient understanding of disease states.  Patient verbalized she understands her disease process and how to effectively manage  Assessed patient's education and care coordination needs.  Denies any concerns at this time.   Provided disease specific education to patient.  The patient endorses following a heart healthy diet, taking medications as ordered, and following plan of care.   Collaborated with appropriate clinical care team members regarding patient needs. The patient denies any needs from the LCSW and Pharmacist but knows they are available.   Patient Self Care Activities related to HLD, COPD, Osteoarthritis and PSVT:   Patient is unable to independently self-manage chronic health conditions  Initial goal documentation        Jenna [name] was given information about Chronic Care Management services today including:  1. CCM service includes personalized support from designated clinical staff supervised by her physician, including individualized plan of care and coordination with other care providers 2. 24/7 contact phone numbers for assistance for urgent and routine care needs. 3. Service will only be billed when office clinical staff spend 20 minutes or more in a month to coordinate care. 4. Only one practitioner may furnish and bill the service in a calendar month. 5. The patient may stop CCM services at any time (effective at the end of the month) by phone call to the office staff. 6. The patient will be responsible for cost sharing (co-pay) of up to 20% of the service fee (after annual deductible is met).  Patient agreed to services and verbal consent obtained.   Patient verbalizes understanding of instructions provided today.   The care management team will reach out to the patient again over the next 60 days.   Noreene Larsson RN, MSN, [place] Mobile: [phone] Preventing Falls and Fractures  Falls can be very serious, especially for older adults or people with osteoporosis  Falls can be caused by:  Tripping or slipping  Slow reflexes  Balance problems  Reduced muscle strength  Poor vision or a recent change in prescription  Illness and some medications (especially blood pressure pills, diuretics, heart medicines, muscle relaxants and sleep medications)  Drinking alcohol  To prevent falls outdoors:  Use a can or walker if needed  Wear rubber-soled shoes so you don't slip  DO NOT buy "shape up" shoes with rocker bottom soles if you have balance problems.  The thick soles and shape make it more difficult to keep your balance.  Put kitty litter or salt on icy sidewalks  Walk on the grass if the sidewalks are slick  Avoid walking on uneven ground whenever possible  T prevent falls indoors:  Keep rooms clutter-free, especially hallways, stairs and paths to light switches  Remove throw rugs  Install night lights, especially to and in the bathroom  Turn on lights before going downstairs  Keep a flashlight next to your bed  Buy a cordless phone to keep with you instead of jumping up to answer the phone  Install grab bars in the bathroom near the shower and toilet  Install rails on both sides of the stairs.  Make sure the stairs are well lit  Wear slippers  with non-skid soles.  Do not walk around in stockings or socks  Balance problems and dizziness are not a normal part of growing older.  If you begin having balance problems or dizziness see your doctor.  Physical Therapy can help you with many balance problems, strengthening hip and leg muscles and with gait training.  To keep your bones healthy make sure you are getting enough calcium and Vitamin D each day.  Ask your doctor or pharmacist about supplements.  Regular weight-bearing exercise like walking, lifting weights or dancing can help  strengthen bones and prevent osteoporosis. It is important to avoid accidents which may result in broken bones.  Here are a few ideas on how to make your home safer so you will be less likely to trip or fall.  8. Use nonskid mats or non slip strips in your shower or tub, on your bathroom floor and around sinks.  If you know that you have spilled water, wipe it up! 9. In the bathroom, it is important to have properly installed grab bars on the walls or on the edge of the tub.  Towel racks are NOT strong enough for you to hold onto or to pull on for support. 10. Stairs and hallways should have enough light.  Add lamps or night lights if you need ore light. 11. It is good to have handrails on both sides of the stairs if possible.  Always fix broken handrails right away. 12. It is important to see the edges of steps.  Paint the edges of outdoor steps white so you can see them better.  Put colored tape on the edge of inside steps. 13. Throw-rugs are dangerous because they can slide.  Removing the rugs is the best idea, but if they must stay, add adhesive carpet tape to prevent slipping. 14. Do not keep things on stairs or in the halls.  Remove small furniture that blocks the halls as it may cause you to trip.  Keep telephone and electrical cords out of the way where you walk. 15. Always were sturdy, rubber-soled shoes for good support.  Never wear just socks, especially on the stairs.  Socks may cause you to slip or fall.  Do not wear full-length housecoats as you can easily trip on the bottom.  16. Place the things you use the most on the shelves that are the easiest to reach.  If you use a stepstool, make sure it is in good condition.  If you feel unsteady, DO NOT climb, ask for help. 17. If a health professional advises you to use a cane or walker, do not be ashamed.  These items can keep you from falling and breaking your bones.

## 2019-05-24 NOTE — Chronic Care Management (AMB) (Signed)
Chronic Care Management   Initial Visit Note  08/24/3823 Name: Jenna [name] MRN: [id number] DOB: [birth year]  Referred by: Olin Hauser, DO Reason for referral : Chronic Care Management (RNCM Chronic Disease management and Care Coordination Needs)   Jenna [name] is a 84 y.o. year old female who is a primary care patient of Olin Hauser, DO. The CCM team was consulted for assistance with chronic disease management and care coordination needs related to HLD, COPD, falls, osteoarthritis and PSVT  Review of patient status, including review of consultants reports, relevant laboratory and other test results, and collaboration with appropriate care team members and the patient's provider was performed as part of comprehensive patient evaluation and provision of chronic care management services.    SDOH (Social Determinants of Health) assessments performed: Yes See Care Plan activities for detailed interventions related to SDOH  SDOH Interventions     Most Recent Value  SDOH Interventions  SDOH Interventions for the Following Domains  Physical Activity  Physical Activity Interventions  Other (Comments) [Does not do any structured activity. Mobility issues]       Medications: Outpatient Encounter Medications as of 05/24/2019  Medication Sig  . albuterol (VENTOLIN HFA) 108 (90 Base) MCG/ACT inhaler Inhale 2 puffs into the lungs every 6 (six) hours as needed for wheezing or shortness of breath.  Marland Kitchen alendronate (FOSAMAX) 70 MG tablet Take 1 tablet (70 mg total) by mouth once a week.  . Ascorbic Acid (VITAMIN C) 1000 MG tablet Take 500 mg by mouth daily.   Marland Kitchen BREO ELLIPTA 100-25 MCG/INH AEPB 1 puff daily.  . Cholecalciferol (VITAMIN D-1000 MAX ST) 1000 UNITS tablet Take 1,000 Units by mouth daily.   . citalopram (CELEXA) 40 MG tablet Take 1 tablet (40 mg total) by mouth daily.  . Cyanocobalamin (RA VITAMIN B-12 TR) 1000 MCG TBCR Take 1,000 mcg by mouth daily.   .  diclofenac sodium (VOLTAREN) 1 % GEL Apply 2 g topically 3 (three) times daily as needed. For hands, arthritis  . famotidine (PEPCID) 20 MG tablet Take 20 mg by mouth 2 (two) times daily.  . ferrous sulfate 325 (65 FE) MG EC tablet Take 325 mg by mouth at bedtime.   . furosemide (LASIX) 20 MG tablet Take 1 tablet (20 mg total) by mouth daily.  Marland Kitchen ipratropium (ATROVENT) 0.02 % nebulizer solution Take 0.5 mg by nebulization 4 (four) times daily.  . methocarbamol (ROBAXIN) 500 MG tablet Take 1 tablet (500 mg total) by mouth every 8 (eight) hours as needed for muscle spasms.  . metoprolol succinate (TOPROL XL) 25 MG 24 hr tablet Take 1 tablet (25 mg total) by mouth daily.  . Multiple Vitamins-Minerals (ONE-A-DAY WOMENS PETITES) TABS Take 2 tablets by mouth every morning.  . Omega-3 Fatty Acids (FISH OIL) 1000 MG CAPS Take 1,000 mg by mouth daily.  . Pediatric Multiple Vit-C-FA (CHEWABLE VITE CHILDRENS) CHEW Chew by mouth daily. occassionally  . simvastatin (ZOCOR) 40 MG tablet Take 1 tablet (40 mg total) by mouth daily.  . traZODone (DESYREL) 50 MG tablet Take 1 tablet (50 mg total) by mouth at bedtime.   No facility-administered encounter medications on file as of 05/24/2019.     Objective:  BP Readings from Last 3 Encounters:  02/01/19 138/80  12/22/18 122/72  11/25/18 (!) 137/57    Goals Addressed            This Visit's Progress   . RNCM: Pt-"I have had a lot of falls and  I have to be careful" (pt-stated)       CARE PLAN ENTRY (see longitudinal plan of care for additional care plan information)  Current Barriers:  Marland Kitchen Knowledge Deficits related to frequent falls related to knee replacement surgery and osteoarthritis  . Chronic Disease Management support and education needs related to osteoarthritis and balance issues in a patient with frequent falls related to knee replacement   Nurse Case Manager Clinical Goal(s):  Marland Kitchen Over the next 120 days, patient will verbalize understanding of  plan for fall prevention and using caution when ambulating . Over the next 120 days, patient will work with [facility] and pcp to address needs related to balance concerns in patient with former knee replacement surgery and poor balance since surgery putting the patient at an increase risk for falls . Over the next 120 days, patient will demonstrate a decrease in fall exacerbations as evidenced by no new falls and no injuries related to falls . Over the next 120 days, patient will attend all scheduled medical appointments: follow up with pcp in December and other providers prn . Over the next 120 days, patient will demonstrate improved health management independence as evidenced byno new falls related to impaired mobility and balance  Interventions:  . Inter-disciplinary care team collaboration (see longitudinal plan of care) . Evaluation of current treatment plan related to fall precautions and patient's adherence to plan as established by provider. . Advised patient to report any falls to the [facility] or pcp . Provided education to patient re: safety in the home: o Changing position slowly o Being safe when ambulating- the patient does not use a cane or a walker when ambulating, states she would fall over it o Removing scatter rugs o Having well lite hallways and surroundings o Wearing non-slip shoes or socks when ambulating . Discussed plans with patient for ongoing care management follow up and provided patient with direct contact information for care management team  Patient Self Care Activities:  . Patient verbalizes understanding of plan to work with the CCM team to meet her health and wellness goals . Self administers medications as prescribed . Attends all scheduled provider appointments . Performs IADL's independently . Calls provider office for new concerns or questions . Unable to independently manage falls in her home environment  Initial goal documentation     . RNCM: Pt-"I have  several health problems" (pt-stated)       CARE PLAN ENTRY (see longtitudinal plan of care for additional care plan information)  Current Barriers:  . Chronic Disease Management support, education, and care coordination needs related to HLD, COPD, Osteoarthritis and PSVT  Clinical Goal(s) related to HLD, COPD, Osteoarthritis and PSVT:  Over the next 120 days, patient will:  . Work with the care management team to address educational, disease management, and care coordination needs  . Begin or continue self health monitoring activities as directed today adhere to heart healthy diet and practice safety in ambulation to prevent falls . Call provider office for new or worsened signs and symptoms Blood pressure findings outside established parameters, Oxygen saturation lower than established parameter, Chest pain, Shortness of breath, and New or worsened symptom related to COPD, Osteoarthritis or other chronic conditions.  . Call care management team with questions or concerns . Verbalize basic understanding of patient centered plan of care established today  Interventions related to HLD, COPD, Osteoarthritis and PSVT:  . Evaluation of current treatment plans and patient's adherence to plan as established by provider.  The  patient feels she has good control of her chronic conditions at this time . Assessed patient understanding of disease states.  Patient verbalized she understands her disease process and how to effectively manage . Assessed patient's education and care coordination needs.  Denies any concerns at this time.  . Provided disease specific education to patient.  The patient endorses following a heart healthy diet, taking medications as ordered, and following plan of care.  Nash Dimmer with appropriate clinical care team members regarding patient needs. The patient denies any needs from the LCSW and Pharmacist but knows they are available.   Patient Self Care Activities related to  HLD, COPD, Osteoarthritis and PSVT:  . Patient is unable to independently self-manage chronic health conditions  Initial goal documentation         Ms. Lentsch was given information about Chronic Care Management services today including:  1. CCM service includes personalized support from designated clinical staff supervised by her physician, including individualized plan of care and coordination with other care providers 2. 24/7 contact phone numbers for assistance for urgent and routine care needs. 3. Service will only be billed when office clinical staff spend 20 minutes or more in a month to coordinate care. 4. Only one practitioner may furnish and bill the service in a calendar month. 5. The patient may stop CCM services at any time (effective at the end of the month) by phone call to the office staff. 6. The patient will be responsible for cost sharing (co-pay) of up to 20% of the service fee (after annual deductible is met).  Patient agreed to services and verbal consent obtained.   Plan:   The care management team will reach out to the patient again over the next 60  days.   Noreene Larsson RN, MSN, [place] Mobile: [phone]

## 2019-05-30 NOTE — Progress Notes (Signed)
Date:  [id number]   ID:  Jenna [name], DOB 1935/03/10, MRN [id number]  Patient Location:  [place] [address]   Provider location:   [facility], [place] office  PCP:  Olin Hauser, DO  Cardiologist:  Arvid Right [facility]  Chief Complaint  Patient presents with  . other    6 month follow up.     History of Present Illness:    Jenna [name] is a 84 y.o. female past medical history of obesity,  Hypertension, hyperlipidemia,  GERD,  chronic cough,  gastric bypass surgery,  depression,   chronic leg edema  Type 2 diabetes hemoglobin A1c 6.2 COPD Former smoker Stage III chronic kidney disease Stable chronic problem with mood, anxiety, secondary insomnia presents for evaluation of pericardial effusion seen on CT scan,  Palpitations  Prior office/telemetry visit April 2020 Had follow-up office visit October 2020, Palpitations at that time Had rapid weight loss on keto diet Will start on metoprolol 12 twice daily  May 2020, long term monitor showed normal sinus rhythm with 99 SVT runs lasting up to 4 minutes 30 secs, max rate of 210 bpm, and average rate 172 bpm.  Presents today in a wheelchair Leg pain, 3-4 days ago, in calf, Back pain,  Lab work reviewed HBA1C 6.1 Total chol 137  Rhythm better on metoprolol succinate 25 daily Denies symptomatic palpitations  EKG personally reviewed by myself on todays visit Shows normal sinus rhythm rate 70 bpm no significant ST-T wave changes  Other past medical history reviewed Prior history of chronic cough. Her other big complaint didn't GERD symptoms. Several other family members have GERD as well. She has had chronic lower extremity edema, prior trauma to her left lower extremity. She takes Lasix 20 mg daily with no significant improvement   Prior CV studies:   The following studies were reviewed today:   Past Medical History:  Diagnosis Date  .  Anxiety   . Arthritis    neck, knees(before replacements)  . Asthma   . CKD (chronic kidney disease), stage III   . COPD (chronic obstructive pulmonary disease) ([place])   . Encounter for colonoscopy due to history of adenomatous colonic polyps   . GERD (gastroesophageal reflux disease)   . Hearing loss of both ears   . History of echocardiogram    a. 10/2013 Echo: EF 60-65%, no rwma, midlly dil LA. Rnl RV fxn.  . Hyperlipidemia   . Osteoporosis   . Palpitations   . PSVT (paroxysmal supraventricular tachycardia) ([place])    a. 05/2018 [facility]io: 99 SVT runs. Fastest 218 x 4:30. Longest 4:38 w/ rate of 172.  . Sleep apnea    resolved with gastric bypass  . Syncope and collapse   . Urine incontinence    Past Surgical History:  Procedure Laterality Date  . BROW LIFT Bilateral 06/27/2015   Procedure: BLEPHAROPLASTY BILATERAL UPPER EYELIDS BILATERAL BLEPHAROTOSIS EYELIDS;  Surgeon: Karle Starch, MD;  Location: [place];  Service: Ophthalmology;  Laterality: Bilateral;  BILATERAL  . CATARACT EXTRACTION W/PHACO Right 02/13/2015   Procedure: CATARACT EXTRACTION PHACO AND INTRAOCULAR LENS PLACEMENT (IOC);  Surgeon: Ronnell Freshwater, MD;  Location: [place];  Service: Ophthalmology;  Laterality: Right;  . CATARACT EXTRACTION W/PHACO Left 03/22/2015   Procedure: CATARACT EXTRACTION PHACO AND INTRAOCULAR LENS PLACEMENT (IOC);  Surgeon: Ronnell Freshwater, MD;  Location: [place];  Service: Ophthalmology;  Laterality: Left;  TORIC  . CHOLECYSTECTOMY  1984  . COLECTOMY    . COSMETIC SURGERY  2012   tummy tuck and excess skin removal  . GASTRIC BYPASS  2010  . REPLACEMENT TOTAL KNEE BILATERAL  1998  . SHOULDER SURGERY  2009   left   . TONSILLECTOMY    . TOTAL ABDOMINAL HYSTERECTOMY  1979     Current Outpatient Medications on File Prior to Visit  Medication Sig Dispense Refill  . albuterol (VENTOLIN HFA) 108 (90 Base) MCG/ACT inhaler Inhale 2 puffs into the  lungs every 6 (six) hours as needed for wheezing or shortness of breath. 1 Inhaler 2  . alendronate (FOSAMAX) 70 MG tablet Take 1 tablet (70 mg total) by mouth once a week. 12 tablet 1  . Ascorbic Acid (VITAMIN C) 1000 MG tablet Take 500 mg by mouth daily.     Marland Kitchen BREO ELLIPTA 100-25 MCG/INH AEPB 1 puff daily.    . Cholecalciferol (VITAMIN D-1000 MAX ST) 1000 UNITS tablet Take 1,000 Units by mouth daily.     . citalopram (CELEXA) 40 MG tablet Take 1 tablet (40 mg total) by mouth daily. 90 tablet 1  . Cyanocobalamin (RA VITAMIN B-12 TR) 1000 MCG TBCR Take 1,000 mcg by mouth daily.     . diclofenac sodium (VOLTAREN) 1 % GEL Apply 2 g topically 3 (three) times daily as needed. For hands, arthritis 100 g 2  . famotidine (PEPCID) 20 MG tablet Take 20 mg by mouth 2 (two) times daily.    . ferrous sulfate 325 (65 FE) MG EC tablet Take 325 mg by mouth at bedtime.     . furosemide (LASIX) 20 MG tablet Take 1 tablet (20 mg total) by mouth daily. 90 tablet 1  . ipratropium (ATROVENT) 0.02 % nebulizer solution Take 0.5 mg by nebulization 4 (four) times daily.    . methocarbamol (ROBAXIN) 500 MG tablet Take 1 tablet (500 mg total) by mouth every 8 (eight) hours as needed for muscle spasms. 30 tablet 1  . metoprolol succinate (TOPROL XL) 25 MG 24 hr tablet Take 1 tablet (25 mg total) by mouth daily. 90 tablet 3  . Multiple Vitamins-Minerals (ONE-A-DAY WOMENS PETITES) TABS Take 2 tablets by mouth every morning.    . Omega-3 Fatty Acids (FISH OIL) 1000 MG CAPS Take 1,000 mg by mouth daily.    . Pediatric Multiple Vit-C-FA (CHEWABLE VITE CHILDRENS) CHEW Chew by mouth daily. occassionally    . simvastatin (ZOCOR) 40 MG tablet Take 1 tablet (40 mg total) by mouth daily. 90 tablet 1  . traZODone (DESYREL) 50 MG tablet Take 1 tablet (50 mg total) by mouth at bedtime. 30 tablet 5   No current facility-administered medications on file prior to visit.     Allergies:   Azithromycin, Codeine, Hydromorphone, Morphine,  Nsaids, Oxycodone, Oxycodone-acetaminophen, Nabumetone, and Promethazine hcl   Social History   Tobacco Use  . Smoking status: Former Smoker    Packs/day: 1.00    Years: 20.00    Pack years: 20.00    Types: Cigarettes    Quit date: 07/1981    Years since quitting: 37.8  . Smokeless tobacco: Former Network engineer Use Topics  . Alcohol use: Not Currently    Comment: past  . Drug use: No      Family Hx: The patient's family history includes Colon cancer in her sister; Diabetes type II in her father and mother; Heart attack in her brother and mother; Heart attack (age of onset: 96)  in her sister; Hyperlipidemia in her sister; Hypertension in her mother and sister; Ovarian cancer in her sister; Pneumonia in her father; Skin cancer in her father.  ROS:   Please see the history of present illness.    Review of Systems  Constitutional: Negative.   HENT: Negative.   Respiratory: Negative.   Cardiovascular: Positive for palpitations.  Gastrointestinal: Negative.   Musculoskeletal: Negative.   Neurological: Negative.   Psychiatric/Behavioral: Negative.   All other systems reviewed and are negative.    Labs/Other Tests and Data Reviewed:    Recent Labs: 01/25/2019: ALT 35; BUN 13; Creat 0.81; Hemoglobin 12.5; Platelets 215; Potassium 3.6; Sodium 141; TSH 4.48   Recent Lipid Panel Lab Results  Component Value Date/Time   CHOL 137 01/25/2019 08:28 AM   TRIG 99 01/25/2019 08:28 AM   HDL 75 01/25/2019 08:28 AM   CHOLHDL 1.8 01/25/2019 08:28 AM   LDLCALC 44 01/25/2019 08:28 AM    Wt Readings from Last 3 Encounters:  02/01/19 201 lb (91.2 kg)  12/22/18 194 lb (88 kg)  11/25/18 200 lb (90.7 kg)     Exam:    BP 120/64 (BP Location: Right Arm, Patient Position: Sitting, Cuff Size: Large)   Pulse 70   Ht 5\' 3"  (1.6 m)   Wt 204 lb 6 oz (92.7 kg)   SpO2 98%   BMI 36.20 kg/m  Constitutional:  oriented to person, place, and time. No distress.  Presenting in a  wheelchair HENT:  Head: Grossly normal Eyes:  no discharge. No scleral icterus.  Neck: No JVD, no carotid bruits  Cardiovascular: Regular rate and rhythm, no murmurs appreciated Nonpitting lower extremity edema Pulmonary/Chest: Clear to auscultation bilaterally, no wheezes or rails Abdominal: Soft.  no distension.  no tenderness.  Musculoskeletal: Normal range of motion Neurological:  normal muscle tone. Coordination normal. No atrophy Skin: Skin warm and dry Psychiatric: normal affect, pleasant   ASSESSMENT & PLAN:    Paroxysmal tachycardia (HCC) SVT, better on metoprolol succinate 25 daily No medication changes made  Centrilobular emphysema (HCC) Breathing stable, uses inhalers as needed Recommended walking program, weight loss  Controlled type 2 diabetes mellitus with diabetic nephropathy, without long-term current use of insulin (HCC) Less active over the past year Hemoglobin A1c 6.1  Leg swelling Likely lymphedema, recommended Ace wraps if needed Unable to put compression hose on  Hyperlipidemia associated with type 2 diabetes mellitus ([place]) Cholesterol is at goal on the current lipid regimen. No changes to the medications were made.  Stable  Essential hypertension Blood pressure is well controlled on today's visit. No changes made to the medications.  Smoker Stopped smoking many years ago Still with residual COPD Walking for conditioning    Total encounter time more than 25 minutes  Greater than 50% was spent in counseling and coordination of care with the patient   Disposition: Follow-up in 12 months   Signed, Ida Rogue, MD  05/31/2019 2:16 PM    [place] [facility] [address], [place], [place] [address]

## 2019-05-31 ENCOUNTER — Ambulatory Visit: Payer: Medicare Other | Admitting: Cardiovascular Disease

## 2019-05-31 ENCOUNTER — Other Ambulatory Visit: Payer: Self-pay

## 2019-05-31 ENCOUNTER — Encounter: Payer: Self-pay | Admitting: Cardiovascular Disease

## 2019-05-31 VITALS — BP 120/64 | HR 70 | Ht 63.0 in | Wt 204.4 lb

## 2019-05-31 DIAGNOSIS — I471 Supraventricular tachycardia: Secondary | ICD-10-CM

## 2019-05-31 DIAGNOSIS — E782 Mixed hyperlipidemia: Secondary | ICD-10-CM

## 2019-05-31 DIAGNOSIS — I10 Essential (primary) hypertension: Secondary | ICD-10-CM

## 2019-05-31 DIAGNOSIS — J432 Centrilobular emphysema: Secondary | ICD-10-CM | POA: Diagnosis not present

## 2019-05-31 DIAGNOSIS — E1121 Type 2 diabetes mellitus with diabetic nephropathy: Secondary | ICD-10-CM | POA: Diagnosis not present

## 2019-05-31 DIAGNOSIS — F17200 Nicotine dependence, unspecified, uncomplicated: Secondary | ICD-10-CM

## 2019-05-31 NOTE — Patient Instructions (Signed)

## 2019-06-03 ENCOUNTER — Ambulatory Visit (INDEPENDENT_AMBULATORY_CARE_PROVIDER_SITE_OTHER): Payer: Medicare Other | Admitting: Family Medicine

## 2019-06-03 ENCOUNTER — Encounter: Payer: Self-pay | Admitting: Family Medicine

## 2019-06-03 ENCOUNTER — Ambulatory Visit
Admission: RE | Admit: 2019-06-03 | Discharge: 2019-06-03 | Disposition: A | Discharge status: Home / Self Care | Payer: Medicare Other | Source: Ambulatory Visit | Attending: Family Medicine | Admitting: Family Medicine

## 2019-06-03 ENCOUNTER — Other Ambulatory Visit: Payer: Self-pay

## 2019-06-03 VITALS — BP 122/46 | HR 52 | Temp 97.6°F | Resp 16 | Ht 63.0 in | Wt 203.0 lb

## 2019-06-03 DIAGNOSIS — M545 Low back pain, unspecified: Secondary | ICD-10-CM

## 2019-06-03 DIAGNOSIS — M8949 Other hypertrophic osteoarthropathy, multiple sites: Secondary | ICD-10-CM | POA: Diagnosis not present

## 2019-06-03 DIAGNOSIS — M150 Primary generalized (osteo)arthritis: Secondary | ICD-10-CM

## 2019-06-03 DIAGNOSIS — M810 Age-related osteoporosis without current pathological fracture: Secondary | ICD-10-CM | POA: Diagnosis not present

## 2019-06-03 DIAGNOSIS — M159 Polyosteoarthritis, unspecified: Secondary | ICD-10-CM

## 2019-06-03 MED ORDER — PREDNISONE 10 MG PO TABS: ORAL_TABLET | ORAL | 0 refills | Status: DC

## 2019-06-03 MED ORDER — METHOCARBAMOL 500 MG PO TABS: 500.0000 mg | ORAL_TABLET | Freq: Three times a day (TID) | ORAL | 2 refills | Status: DC | PRN

## 2019-06-03 NOTE — Progress Notes (Signed)
Subjective:    Patient ID: Jenna [name], female    DOB: 17-Jan-1936, 84 y.o.   MRN: [id number]  Jenna [name] is a 84 y.o. female presenting on 06/03/2019 for Back Pain (onset 4 days painful ROM )  Patient accompanied by daughter, Neoma Laming today.  HPI   Left Low Back Pain, Acute Osteoporosis History of Scatica - Reports symptoms started about 4 days ago without inciting injury, got up from chair and had severe localized back pain, history of prior back flares but this is more severe. Today seems to be worsening or not improving. Describes pain as moderate severity with episodic worsening severe pain, worse with activity. No radiating pain to legs - Taking Tylenol, Methocarbamol PRN. Has failed or intolerance to most pain medicines, see allergy list. - Tried heating pad at night, ice, muscle - History of lumbar OA/DJD known Last DEXA 08/25/17 with osteoporosis femur, takes Fosamax 70mg  weekly - Denies any fevers/chills, numbness, tingling, weakness, loss of control bladder/bowel incontinence or retention, unintentional wt loss, night sweats     Allergies  Allergen Reactions  . Azithromycin Other (See Comments)    Causes stomach burning pains  . Hydromorphone Other (See Comments)    confusion, personality change  . Morphine Other (See Comments)    Goes crazy  . Nsaids Other (See Comments)    Avoids because of gastric bypass surgery  . Oxycodone-Acetaminophen Nausea And Vomiting  . Codeine Other (See Comments) and Nausea And Vomiting    "will not stay down"  . Nabumetone Rash  . Oxycodone Nausea And Vomiting  . Oxycodone-Acetaminophen Nausea And Vomiting  . Promethazine Hcl Rash and Other (See Comments)      Depression screen [facility] 2/9 05/24/2019 02/01/2019 02/01/2019  Decreased Interest 0 1 1  Down, Depressed, Hopeless 0 0 0  PHQ - 2 Score 0 1 1  Altered sleeping - 2 2  Tired, decreased energy - 1 1  Change in appetite - 0 0  Feeling bad or failure about yourself  - 0 0    Trouble concentrating - 0 0  Moving slowly or fidgety/restless - 0 0  Suicidal thoughts - 0 0  PHQ-9 Score - 4 4  Difficult doing work/chores - Somewhat difficult Somewhat difficult    Social History   Tobacco Use  . Smoking status: Former Smoker    Packs/day: 1.00    Years: 20.00    Pack years: 20.00    Types: Cigarettes    Quit date: 07/1981    Years since quitting: 37.8  . Smokeless tobacco: Former Network engineer Use Topics  . Alcohol use: Not Currently    Comment: past  . Drug use: No    Review of Systems Per HPI unless specifically indicated above     Objective:    BP (!) 122/46   Pulse (!) 52   Temp 97.6 F (36.4 C) (Temporal)   Resp 16   Ht 5\' 3"  (1.6 m)   Wt 203 lb (92.1 kg)   SpO2 100%   BMI 35.96 kg/m   Wt Readings from Last 3 Encounters:  06/03/19 203 lb (92.1 kg)  05/31/19 204 lb 6 oz (92.7 kg)  02/01/19 201 lb (91.2 kg)    Physical Exam Vitals and nursing note reviewed.  Constitutional:      General: She is not in acute distress.    Appearance: She is well-developed. She is obese. She is not diaphoretic.     Comments: Well-appearing, comfortable, cooperative  HENT:     Head: Normocephalic and atraumatic.  Eyes:     General:        Right eye: No discharge.        Left eye: No discharge.     Conjunctiva/sclera: Conjunctivae normal.  Neck:     Thyroid: No thyromegaly.  Cardiovascular:     Rate and Rhythm: Normal rate and regular rhythm.     Heart sounds: Normal heart sounds. No murmur.  Pulmonary:     Effort: Pulmonary effort is normal. No respiratory distress.     Breath sounds: Normal breath sounds. No wheezing or rales.  Musculoskeletal:     Cervical back: Normal range of motion and neck supple.     Comments: In wheelchair today  Low Back Inspection: Normal appearance, Large body habitus, no spinal deformity, symmetrical. Palpation: No tenderness over spinous processes. L>R Bilateral lumbar paraspinal muscles mild tender and with  hypertonicity/spasm. ROM: Reduced active ROM forward flex / extension Special Testing: Seated SLR negative for radicular pain bilaterally  Strength: Bilateral hip flex/ext 5/5, knee flex/ext 5/5, ankle dorsiflex/plantarflex 5/5 Neurovascular: intact distal sensation to light touch   Lymphadenopathy:     Cervical: No cervical adenopathy.  Skin:    General: Skin is warm and dry.     Findings: No erythema or rash.  Neurological:     Mental Status: She is alert and oriented to person, place, and time.  Psychiatric:        Behavior: Behavior normal.     Comments: Well groomed, good eye contact, normal speech and thoughts      I have personally reviewed the radiology report from Lumbar STAT X-ray on 06/03/19.  CLINICAL DATA:  Acute left low back pain.  EXAM: LUMBAR SPINE - COMPLETE 4+ VIEW  COMPARISON:  01/20/2006  FINDINGS: Five lumbar type vertebral bodies. Mild curvature convex to the right with the apex at L3. No evidence of fracture in the region from T12 through S3. Ordinary mild lower lumbar disc space narrowing. Mild lower lumbar facet osteoarthritis. No focal destructive lesion. Sacroiliac joints appear unremarkable.  IMPRESSION: No acute finding in the lumbosacral spine. No sign of recent fracture. Mild lower lumbar degenerative disc disease and degenerative facet disease considering age.   Electronically Signed   By: Nelson Chimes M.D.   On: 06/03/2019 11:45   Results for orders placed or performed in visit on 01/25/19  TSH  Result Value Ref Range   TSH 4.48 0.40 - 4.50 mIU/L  T4, free  Result Value Ref Range   Free T4 1.2 0.8 - 1.8 ng/dL  Lipid panel  Result Value Ref Range   Cholesterol 137 <200 mg/dL   HDL 75 > OR = 50 mg/dL   Triglycerides 99 <150 mg/dL   LDL Cholesterol (Calc) 44 mg/dL (calc)   Total CHOL/HDL Ratio 1.8 <5.0 (calc)   Non-HDL Cholesterol (Calc) 62 <130 mg/dL (calc)  COMPLETE METABOLIC PANEL WITH GFR  Result Value Ref Range    Glucose, Bld 143 (H) 65 - 99 mg/dL   BUN 13 7 - 25 mg/dL   Creat 0.81 0.60 - 0.88 mg/dL   GFR, Est Non African American 68 > OR = 60 mL/min/1.15m2   GFR, Est African American 79 > OR = 60 mL/min/1.39m2   BUN/Creatinine Ratio NOT APPLICABLE 6 - 22 (calc)   Sodium 141 135 - 146 mmol/L   Potassium 3.6 3.5 - 5.3 mmol/L   Chloride 106 98 - 110 mmol/L   CO2 25 20 -  32 mmol/L   Calcium 8.6 8.6 - 10.4 mg/dL   Total Protein 5.9 (L) 6.1 - 8.1 g/dL   Albumin 3.7 3.6 - 5.1 g/dL   Globulin 2.2 1.9 - 3.7 g/dL (calc)   AG Ratio 1.7 1.0 - 2.5 (calc)   Total Bilirubin 0.5 0.2 - 1.2 mg/dL   Alkaline phosphatase (APISO) 74 37 - 153 U/L   AST 28 10 - 35 U/L   ALT 35 (H) 6 - 29 U/L  CBC with Differential/Platelet  Result Value Ref Range   WBC 6.1 3.8 - 10.8 Thousand/uL   RBC 3.89 3.80 - 5.10 Million/uL   Hemoglobin 12.5 11.7 - 15.5 g/dL   HCT 37.3 35.0 - 45.0 %   MCV 95.9 80.0 - 100.0 fL   MCH 32.1 27.0 - 33.0 pg   MCHC 33.5 32.0 - 36.0 g/dL   RDW 12.0 11.0 - 15.0 %   Platelets 215 140 - 400 Thousand/uL   MPV 11.0 7.5 - 12.5 fL   Neutro Abs 3,556 1,500 - 7,800 cells/uL   Lymphs Abs 1,867 850 - 3,900 cells/uL   Absolute Monocytes 494 200 - 950 cells/uL   Eosinophils Absolute 140 15 - 500 cells/uL   Basophils Absolute 43 0 - 200 cells/uL   Neutrophils Relative % 58.3 %   Total Lymphocyte 30.6 %   Monocytes Relative 8.1 %   Eosinophils Relative 2.3 %   Basophils Relative 0.7 %  Hemoglobin A1c  Result Value Ref Range   Hgb A1c MFr Bld 6.1 (H) <5.7 % of total Hgb   Mean Plasma Glucose 128 (calc)   eAG (mmol/L) 7.1 (calc)      Assessment & Plan:   Problem List Items Addressed This Visit    Primary osteoarthritis involving multiple joints   Relevant Medications   methocarbamol (ROBAXIN) 500 MG tablet   Other Relevant Orders   DG Lumbar Spine Complete   Age-related osteoporosis without current pathological fracture   Relevant Orders   DG Lumbar Spine Complete    Other Visit Diagnoses      Acute left-sided low back pain without sciatica    -  Primary   Relevant Medications   methocarbamol (ROBAXIN) 500 MG tablet   Other Relevant Orders   DG Lumbar Spine Complete      Acute on chronic L>R LBP without associated sciatica. Suspect likely due to muscle spasm/strain, without known injury or trauma. In setting of known chronic LBP with DJD,  - No red flag symptoms. Negative SLR for radiculopathy - Inadequate conservative therapy  - Known osteoporosis in femurs, last DEXA 2019. At risk of compression fracture.  Plan: 1. Check STAT Lumbar X-ray today - *update, called result after visit 1240pm today spoke with [place] and reviewed result, no acute compression fracture or other acute abnormality, stable DJD results* 2. Start Prednisone taper 60 to 10mg  over 6 days 3. Refilled inc dose Methocarbamol, caution sedation 4. May use Tylenol PRN for breakthrough 5. Encouraged use of heating pad 1-2x daily for now then PRN Follow-up 4-6 weeks if not improved for re-evaluation, possible trial of PT, and possibly she can call and return to her Orthopedic   Meds ordered this encounter  Medications  . methocarbamol (ROBAXIN) 500 MG tablet    Sig: Take 1 tablet (500 mg total) by mouth every 8 (eight) hours as needed for muscle spasms.    Dispense:  60 tablet    Refill:  2     Follow up plan: Return in  about 4 weeks (around 07/01/2019), or if symptoms worsen or fail to improve, for back pain.    Nobie Putnam, [facility] 06/03/2019, 10:00 AM

## 2019-06-03 NOTE — Patient Instructions (Addendum)
Thank you for coming to the office today.  1. For your Back Pain - I think that this is due to Muscle Spasms or strain.  - I don't think your Sciatic Nerve can be affected causing some of your radiation and numbness down your legs. 2. Refilled Methocarbamol as needed 3. May use Tylenol Extra Str 500mg  tabs - may take 1-2 tablets every 6 hours as needed Recommend to start using heating pad on your lower back 1-2x daily for few weeks  X-ray - call today, IF it is compression fracture - will add NEW medicine Calcitonin - and request that you call Orthopedic or we can place a new referral.  IF NOT a compression fracture - we can add a Prednisone.  Also try a Wedge Seat Cushion to avoid nerve pinching when sitting prolonged period of time.  This pain may take weeks to months to fully resolve, but hopefully it will respond to the medicine initially. All back injuries (small or serious) are slow to heal since we use our back muscles every day. Be careful with turning, twisting, lifting, sitting / standing for prolonged periods, and avoid re-injury.  If your symptoms significantly worsen with more pain, or new symptoms with weakness in one or both legs, new or different shooting leg pains, numbness in legs or groin, loss of control or retention of urine or bowel movements, please call back for advice and you may need to go directly to the Emergency Department.   Please schedule a Follow-up Appointment to: Return in about 4 weeks (around 07/01/2019), or if symptoms worsen or fail to improve, for back pain.  If you have any other questions or concerns, please feel free to call the office or send a message through [place]. You may also schedule an earlier appointment if necessary.  Additionally, you may be receiving a survey about your experience at our office within a few days to 1 week by e-mail or mail. We value your feedback.  Nobie Putnam, DO [place]

## 2019-06-29 DIAGNOSIS — H35352 Cystoid macular degeneration, left eye: Secondary | ICD-10-CM | POA: Diagnosis not present

## 2019-07-22 ENCOUNTER — Telehealth: Payer: Medicare Other

## 2019-07-22 ENCOUNTER — Ambulatory Visit: Payer: Self-pay | Admitting: General Practice

## 2019-07-22 NOTE — Chronic Care Management (AMB) (Signed)
°  Chronic Care Management   Outreach Note  01/29/5907 Name: Jenna [name] MRN: [id number] DOB: [birth year]  Referred by: Olin Hauser, DO Reason for referral : Chronic Care Management (Follow up: RNCM Chronic Disease Management and Care Coordination Needs)   An unsuccessful telephone outreach was attempted today. The patient was referred to the case management team for assistance with care management and care coordination.   Follow Up Plan: A HIPPA compliant phone message was left for the patient providing contact information and requesting a return call.   Noreene Larsson RN, MSN, [place] Mobile: [phone]

## 2019-08-04 ENCOUNTER — Other Ambulatory Visit: Payer: Self-pay | Admitting: Family Medicine

## 2019-08-04 DIAGNOSIS — M85852 Other specified disorders of bone density and structure, left thigh: Secondary | ICD-10-CM

## 2019-08-04 MED ORDER — ALENDRONATE SODIUM 70 MG PO TABS: 70.0000 mg | ORAL_TABLET | ORAL | 1 refills | Status: DC

## 2019-08-06 ENCOUNTER — Other Ambulatory Visit: Payer: Self-pay

## 2019-08-06 ENCOUNTER — Telehealth: Payer: Self-pay

## 2019-08-06 ENCOUNTER — Ambulatory Visit (INDEPENDENT_AMBULATORY_CARE_PROVIDER_SITE_OTHER): Payer: Medicare Other | Admitting: Family Medicine

## 2019-08-06 ENCOUNTER — Encounter: Payer: Self-pay | Admitting: Family Medicine

## 2019-08-06 VITALS — BP 128/56 | HR 64 | Temp 97.7°F | Resp 16 | Ht 63.0 in | Wt 210.6 lb

## 2019-08-06 DIAGNOSIS — B351 Tinea unguium: Secondary | ICD-10-CM | POA: Diagnosis not present

## 2019-08-06 DIAGNOSIS — E1121 Type 2 diabetes mellitus with diabetic nephropathy: Secondary | ICD-10-CM | POA: Diagnosis not present

## 2019-08-06 DIAGNOSIS — B353 Tinea pedis: Secondary | ICD-10-CM | POA: Diagnosis not present

## 2019-08-06 DIAGNOSIS — L853 Xerosis cutis: Secondary | ICD-10-CM | POA: Diagnosis not present

## 2019-08-06 DIAGNOSIS — R5383 Other fatigue: Secondary | ICD-10-CM

## 2019-08-06 LAB — POCT UA - MICROALBUMIN: Microalbumin Ur, POC: 20 mg/L

## 2019-08-06 MED ORDER — TERBINAFINE HCL 250 MG PO TABS: 250.0000 mg | ORAL_TABLET | Freq: Every day | ORAL | 2 refills | Status: DC

## 2019-08-06 MED ORDER — CLOTRIMAZOLE-BETAMETHASONE 1-0.05 % EX CREA: TOPICAL_CREAM | CUTANEOUS | 0 refills | Status: DC

## 2019-08-06 NOTE — Assessment & Plan Note (Signed)
Well controlled DM2 w/ A1c 6.1 previously, now >6 months due for repeat A1c No hypoglycemia Complications - CKD-III stable, other including hyperlipidemia, GERD, depression, obesity - increases risk of future cardiovascular complications / poor glucose control due to reduced lifestyle diet/exercise with low energy mood and fatigue  Plan:  1. Remain diet controlled, off meds 2. Encourage improved lifestyle - low carb, low sugar diet, reduce portion size, continue improving regular exercise 3. Check CBG , bring log to next visit for review 4. Continue Statin 5. Check urine microalbumin today - result negative 20 6. UTD DM Eye 7. DM Foot today  Future labs include A1c in 1 week.

## 2019-08-06 NOTE — Telephone Encounter (Signed)
Copied from [place] [id number]. Topic: General - Other >> Aug 06, 2019  3:22 PM Celene Kras wrote: Reason for CRM: Pt calling per request of PCP stating that the medication she has is Clear nails plus. Please advise .

## 2019-08-06 NOTE — Patient Instructions (Addendum)
Thank you for coming to the office today.  Try topical and oral anti fungal , up to 3 months on oral terbinafine if needed  Call with name of other  Urine test for protein  Labs in 1 week, will send results.  Then 6 months  DUE for FASTING BLOOD WORK (no food or drink after midnight before the lab appointment, only water or coffee without cream/sugar on the morning of)  SCHEDULE "Lab Only" visit in the morning at the clinic for lab draw in 1 WEEK  - Make sure Lab Only appointment is at about 1 week before your next appointment, so that results will be available  For Lab Results, once available within 2-3 days of blood draw, you can can log in to MyChart online to view your results and a brief explanation. Also, we can discuss results at next follow-up visit.   Please schedule a Follow-up Appointment to: Return in about 6 months (around 02/06/2020) for 1 week non fasting lab, then 6 month Annual Physical.  If you have any other questions or concerns, please feel free to call the office or send a message through [place]. You may also schedule an earlier appointment if necessary.  Additionally, you may be receiving a survey about your experience at our office within a few days to 1 week by e-mail or mail. We value your feedback.  Nobie Putnam, DO [place]

## 2019-08-06 NOTE — Telephone Encounter (Signed)
Called her back. Clear Nails supplement is a natural remedy containing various supplements including turmeric. I advised her that this is fine to take, but may not resolve the problem. She can take the rx from today as planned and may continue this if she likes follow package instructions.  Nobie Putnam, [facility] 08/06/2019, 4:49 PM

## 2019-08-06 NOTE — Progress Notes (Signed)
Subjective:    Patient ID: Jenna [name], female    DOB: 1935-02-18, 84 y.o.   MRN: [id number]  Kimberly Nieland is a 84 y.o. female presenting on 08/06/2019 for Nail Problem (left foot)   HPI   Left Toenail Onychomycosis / Tinea Pedis Persistent problem, previously was advised to use topical cortisone limited relief Now here to follow-up Reports L > R foot with rash on side dry flaky skin, bottom of foot is white with thick dry flaky skin. L toenail is thickened and enlarged has been removed x 2 in past. No podiatrist currently She has ordered a daily medication for toenails and fungus. Unsure name, she will call us.  Type 2 Diabetes Diet controlled. Not on medicine. Due A1c / Urine Microalbumin Last lab A1c 6/1 in 01/2019 Denies numbness tingling  Admits some urgency for BM after eating. Denies abdominal pain. Not on metformin   Health Maintenance: UTD Ruso vaccine.  Depression screen [facility] 2/9 08/06/2019 05/24/2019 02/01/2019  Decreased Interest 1 0 1  Down, Depressed, Hopeless 0 0 0  PHQ - 2 Score 1 0 1  Altered sleeping 1 - 2  Tired, decreased energy 2 - 1  Change in appetite 0 - 0  Feeling bad or failure about yourself  0 - 0  Trouble concentrating 0 - 0  Moving slowly or fidgety/restless 0 - 0  Suicidal thoughts 0 - 0  PHQ-9 Score 4 - 4  Difficult doing work/chores Not difficult at all - Somewhat difficult    Social History   Tobacco Use  . Smoking status: Former Smoker    Packs/day: 1.00    Years: 20.00    Pack years: 20.00    Types: Cigarettes    Quit date: 07/1981    Years since quitting: 38.0  . Smokeless tobacco: Former Network engineer Use Topics  . Alcohol use: Not Currently    Comment: past  . Drug use: No    Review of Systems Per HPI unless specifically indicated above     Objective:    BP (!) 128/56   Pulse 64   Temp 97.7 F (36.5 C) (Temporal)   Resp 16   Ht 5\' 3"  (1.6 m)   Wt 210 lb 9.6 oz (95.5 kg)   SpO2 96%   BMI  37.31 kg/m   Wt Readings from Last 3 Encounters:  08/06/19 210 lb 9.6 oz (95.5 kg)  06/03/19 203 lb (92.1 kg)  05/31/19 204 lb 6 oz (92.7 kg)    Physical Exam Vitals and nursing note reviewed.  Constitutional:      General: She is not in acute distress.    Appearance: She is well-developed. She is obese. She is not diaphoretic.     Comments: Well-appearing, comfortable, cooperative  HENT:     Head: Normocephalic and atraumatic.  Eyes:     General:        Right eye: No discharge.        Left eye: No discharge.     Conjunctiva/sclera: Conjunctivae normal.     Pupils: Pupils are equal, round, and reactive to light.  Neck:     Thyroid: No thyromegaly.  Cardiovascular:     Rate and Rhythm: Normal rate and regular rhythm.     Heart sounds: Normal heart sounds. No murmur heard.   Pulmonary:     Effort: Pulmonary effort is normal. No respiratory distress.     Breath sounds: Normal breath sounds. No wheezing or rales.  Abdominal:     General: Bowel sounds are normal. There is no distension.     Palpations: Abdomen is soft. There is no mass.     Tenderness: There is no abdominal tenderness.  Musculoskeletal:        General: No tenderness. Normal range of motion.     Cervical back: Normal range of motion and neck supple.     Right lower leg: Edema (non pitting edema) present.     Left lower leg: Edema (non pitting edema) present.     Comments: Upper / Lower Extremities: - Normal muscle tone, strength bilateral upper extremities 5/5, lower extremities 5/5  Lymphadenopathy:     Cervical: No cervical adenopathy.  Skin:    General: Skin is warm and dry.     Findings: No erythema or rash.  Neurological:     Mental Status: She is alert and oriented to person, place, and time.     Comments: Distal sensation intact to light touch all extremities  Psychiatric:        Behavior: Behavior normal.     Comments: Well groomed, good eye contact, normal speech and thoughts      Diabetic  Foot Exam - Simple   Simple Foot Form Diabetic Foot exam was performed with the following findings: Yes 08/06/2019  2:30 PM  Visual Inspection See comments: Yes Sensation Testing Intact to touch and monofilament testing bilaterally: Yes Pulse Check Posterior Tibialis and Dorsalis pulse intact bilaterally: Yes Comments Left foot lateral with dry skin dermatitis diffusely and some evidence of raw peeling skin, left great toenail thickened onychomycosis. Right foot with some mild dermatitis. Some callus formation bilateral heels.    Results for orders placed or performed in visit on 08/06/19 (from the past 24 hour(s))  POCT UA - Microalbumin     Status: Abnormal   Collection Time: 08/06/19  4:58 PM  Result Value Ref Range   Microalbumin Ur, POC 20 mg/L      Results for orders placed or performed in visit on 01/25/19  TSH  Result Value Ref Range   TSH 4.48 0.40 - 4.50 mIU/L  T4, free  Result Value Ref Range   Free T4 1.2 0.8 - 1.8 ng/dL  Lipid panel  Result Value Ref Range   Cholesterol 137 <200 mg/dL   HDL 75 > OR = 50 mg/dL   Triglycerides 99 <150 mg/dL   LDL Cholesterol (Calc) 44 mg/dL (calc)   Total CHOL/HDL Ratio 1.8 <5.0 (calc)   Non-HDL Cholesterol (Calc) 62 <130 mg/dL (calc)  COMPLETE METABOLIC PANEL WITH GFR  Result Value Ref Range   Glucose, Bld 143 (H) 65 - 99 mg/dL   BUN 13 7 - 25 mg/dL   Creat 0.81 0.60 - 0.88 mg/dL   GFR, Est Non African American 68 > OR = 60 mL/min/1.66m2   GFR, Est African American 79 > OR = 60 mL/min/1.34m2   BUN/Creatinine Ratio NOT APPLICABLE 6 - 22 (calc)   Sodium 141 135 - 146 mmol/L   Potassium 3.6 3.5 - 5.3 mmol/L   Chloride 106 98 - 110 mmol/L   CO2 25 20 - 32 mmol/L   Calcium 8.6 8.6 - 10.4 mg/dL   Total Protein 5.9 (L) 6.1 - 8.1 g/dL   Albumin 3.7 3.6 - 5.1 g/dL   Globulin 2.2 1.9 - 3.7 g/dL (calc)   AG Ratio 1.7 1.0 - 2.5 (calc)   Total Bilirubin 0.5 0.2 - 1.2 mg/dL   Alkaline phosphatase (APISO) 74 37 -  153 U/L   AST 28  10 - 35 U/L   ALT 35 (H) 6 - 29 U/L  CBC with Differential/Platelet  Result Value Ref Range   WBC 6.1 3.8 - 10.8 Thousand/uL   RBC 3.89 3.80 - 5.10 Million/uL   Hemoglobin 12.5 11.7 - 15.5 g/dL   HCT 37.3 35 - 45 %   MCV 95.9 80.0 - 100.0 fL   MCH 32.1 27.0 - 33.0 pg   MCHC 33.5 32.0 - 36.0 g/dL   RDW 12.0 11.0 - 15.0 %   Platelets 215 140 - 400 Thousand/uL   MPV 11.0 7.5 - 12.5 fL   Neutro Abs 3,556 1,500 - 7,800 cells/uL   Lymphs Abs 1,867 850 - 3,900 cells/uL   Absolute Monocytes 494 200 - 950 cells/uL   Eosinophils Absolute 140 15 - 500 cells/uL   Basophils Absolute 43 0 - 200 cells/uL   Neutrophils Relative % 58.3 %   Total Lymphocyte 30.6 %   Monocytes Relative 8.1 %   Eosinophils Relative 2.3 %   Basophils Relative 0.7 %  Hemoglobin A1c  Result Value Ref Range   Hgb A1c MFr Bld 6.1 (H) <5.7 % of total Hgb   Mean Plasma Glucose 128 (calc)   eAG (mmol/L) 7.1 (calc)        Assessment & Plan:   Problem List Items Addressed This Visit    Controlled type 2 diabetes mellitus with diabetic nephropathy, without long-term current use of insulin ([place]) - Primary    Well controlled DM2 w/ A1c 6.1 previously, now >6 months due for repeat A1c No hypoglycemia Complications - CKD-III stable, other including hyperlipidemia, GERD, depression, obesity - increases risk of future cardiovascular complications / poor glucose control due to reduced lifestyle diet/exercise with low energy mood and fatigue  Plan:  1. Remain diet controlled, off meds 2. Encourage improved lifestyle - low carb, low sugar diet, reduce portion size, continue improving regular exercise 3. Check CBG , bring log to next visit for review 4. Continue Statin 5. Check urine microalbumin today - result negative 20 6. UTD DM Eye 7. DM Foot today  Future labs include A1c in 1 week.      Relevant Orders   POCT UA - Microalbumin (Completed)   Hemoglobin A1c   CBC with Differential/Platelet   COMPLETE METABOLIC  PANEL WITH GFR    Other Visit Diagnoses    Dry skin dermatitis       Relevant Medications   clotrimazole-betamethasone (LOTRISONE) cream   Tinea pedis of both feet       Relevant Medications   terbinafine (LAMISIL) 250 MG tablet   clotrimazole-betamethasone (LOTRISONE) cream   Onychomycosis of left great toe       Relevant Medications   terbinafine (LAMISIL) 250 MG tablet   clotrimazole-betamethasone (LOTRISONE) cream   Other fatigue       Relevant Orders   CBC with Differential/Platelet    #Fatigue Constellation of symptoms, prior Hgb mild low but anemic Will check CBC with upcoming labs. Last panel 01/2019  #Tinea pedis / Onychomycosis L great toe / Dermatitis Feet Significant rash across L foot and mild R side Suspect tinea and some dermatitis based on exam Toenail may be onychomycosis or perhaps overgrown on regrowth since it has come off >2 times in past. No sign of acute bacterial infection. See DM Foot exam.  Trial on oral Terbinafine 250mg  daily x 1-3 months, refills given Check CMET LFTs next week Add topical clotrimazole-betamethasone cream BID  PRN May take OTC supplement for toenail skin hair health.    Meds ordered this encounter  Medications  . terbinafine (LAMISIL) 250 MG tablet    Sig: Take 1 tablet (250 mg total) by mouth daily.    Dispense:  30 tablet    Refill:  2  . clotrimazole-betamethasone (LOTRISONE) cream    Sig: Apply 1-2 times a day for worsening flare dry skin dermatitis of toes/feet, may re-use daily up to 1 week as needed.    Dispense:  30 g    Refill:  0     Follow up plan: Return in about 6 months (around 02/06/2020) for 1 week non fasting lab, then 6 month Annual Physical.  Future labs ordered for 08/11/19 CMET, CBC, A1c  Will return for labs / physical in 01/2020. Will need orders.   Nobie Putnam, [facility] 08/06/2019, 2:21 PM

## 2019-08-09 ENCOUNTER — Other Ambulatory Visit: Payer: Self-pay

## 2019-08-09 DIAGNOSIS — E1121 Type 2 diabetes mellitus with diabetic nephropathy: Secondary | ICD-10-CM

## 2019-08-09 DIAGNOSIS — R5383 Other fatigue: Secondary | ICD-10-CM

## 2019-08-10 DIAGNOSIS — H35352 Cystoid macular degeneration, left eye: Secondary | ICD-10-CM | POA: Diagnosis not present

## 2019-08-11 ENCOUNTER — Other Ambulatory Visit: Payer: Medicare Other

## 2019-08-11 ENCOUNTER — Other Ambulatory Visit: Payer: Self-pay

## 2019-08-16 DIAGNOSIS — R5383 Other fatigue: Secondary | ICD-10-CM | POA: Diagnosis not present

## 2019-08-16 DIAGNOSIS — E1121 Type 2 diabetes mellitus with diabetic nephropathy: Secondary | ICD-10-CM | POA: Diagnosis not present

## 2019-08-17 LAB — CBC WITH DIFFERENTIAL/PLATELET
Absolute Monocytes: 509 cells/uL (ref 200–950)
Basophils Absolute: 40 cells/uL (ref 0–200)
Basophils Relative: 0.6 %
Eosinophils Absolute: 228 cells/uL (ref 15–500)
Eosinophils Relative: 3.4 %
HCT: 39.7 % (ref 35.0–45.0)
Hemoglobin: 13 g/dL (ref 11.7–15.5)
Lymphs Abs: 2841 cells/uL (ref 850–3900)
MCH: 32 pg (ref 27.0–33.0)
MCHC: 32.7 g/dL (ref 32.0–36.0)
MCV: 97.8 fL (ref 80.0–100.0)
MPV: 10.8 fL (ref 7.5–12.5)
Monocytes Relative: 7.6 %
Neutro Abs: 3082 cells/uL (ref 1500–7800)
Neutrophils Relative %: 46 %
Platelets: 196 10*3/uL (ref 140–400)
RBC: 4.06 10*6/uL (ref 3.80–5.10)
RDW: 11.8 % (ref 11.0–15.0)
Total Lymphocyte: 42.4 %
WBC: 6.7 10*3/uL (ref 3.8–10.8)

## 2019-08-17 LAB — COMPLETE METABOLIC PANEL WITH GFR
AG Ratio: 2 (calc) (ref 1.0–2.5)
ALT: 30 U/L — ABNORMAL HIGH (ref 6–29)
AST: 24 U/L (ref 10–35)
Albumin: 4 g/dL (ref 3.6–5.1)
Alkaline phosphatase (APISO): 60 U/L (ref 37–153)
BUN/Creatinine Ratio: 19 (calc) (ref 6–22)
BUN: 18 mg/dL (ref 7–25)
CO2: 26 mmol/L (ref 20–32)
Calcium: 8.7 mg/dL (ref 8.6–10.4)
Chloride: 102 mmol/L (ref 98–110)
Creat: 0.94 mg/dL — ABNORMAL HIGH (ref 0.60–0.88)
GFR, Est African American: 65 mL/min/{1.73_m2} (ref >=60)
GFR, Est Non African American: 56 mL/min/{1.73_m2} — ABNORMAL LOW (ref >=60)
Globulin: 2 g/dL (calc) (ref 1.9–3.7)
Glucose, Bld: 118 mg/dL — ABNORMAL HIGH (ref 65–99)
Potassium: 4.6 mmol/L (ref 3.5–5.3)
Sodium: 138 mmol/L (ref 135–146)
Total Bilirubin: 0.6 mg/dL (ref 0.2–1.2)
Total Protein: 6 g/dL — ABNORMAL LOW (ref 6.1–8.1)

## 2019-08-17 LAB — HEMOGLOBIN A1C
Hgb A1c MFr Bld: 6.3 % of total Hgb — ABNORMAL HIGH (ref <5.7)
Mean Plasma Glucose: 134 (calc)
eAG (mmol/L): 7.4 (calc)

## 2019-08-26 ENCOUNTER — Telehealth: Payer: Medicare Other

## 2019-08-26 ENCOUNTER — Telehealth: Payer: Self-pay | Admitting: General Practice

## 2019-08-26 NOTE — Telephone Encounter (Signed)
  Chronic Care Management   Outreach Note  07/26/7009 Name: Ashley Bultema MRN: [id number] DOB: 1936-01-03  Referred by: Olin Hauser, DO Reason for referral : Appointment (RNCM Follow up Call/2nd attempt Chronic Disease Managment and Care Coordination Needs)   A second unsuccessful telephone outreach was attempted today. The patient was referred to the case management team for assistance with care management and care coordination.   Follow Up Plan: A HIPPA compliant phone message was left for the patient providing contact information and requesting a return call.   Noreene Larsson RN, MSN, [place] Mobile: [phone]

## 2019-09-03 NOTE — Telephone Encounter (Signed)
Pt has been r/s for 09/20/2019

## 2019-09-07 DIAGNOSIS — J439 Emphysema, unspecified: Secondary | ICD-10-CM | POA: Diagnosis not present

## 2019-09-07 DIAGNOSIS — R0600 Dyspnea, unspecified: Secondary | ICD-10-CM | POA: Diagnosis not present

## 2019-09-20 ENCOUNTER — Telehealth: Payer: Medicare Other | Admitting: General Practice

## 2019-09-20 ENCOUNTER — Ambulatory Visit (INDEPENDENT_AMBULATORY_CARE_PROVIDER_SITE_OTHER): Payer: Medicare Other | Admitting: General Practice

## 2019-09-20 DIAGNOSIS — E785 Hyperlipidemia, unspecified: Secondary | ICD-10-CM | POA: Diagnosis not present

## 2019-09-20 DIAGNOSIS — F331 Major depressive disorder, recurrent, moderate: Secondary | ICD-10-CM

## 2019-09-20 DIAGNOSIS — J432 Centrilobular emphysema: Secondary | ICD-10-CM

## 2019-09-20 DIAGNOSIS — E1169 Type 2 diabetes mellitus with other specified complication: Secondary | ICD-10-CM

## 2019-09-20 DIAGNOSIS — R296 Repeated falls: Secondary | ICD-10-CM

## 2019-09-20 NOTE — Patient Instructions (Signed)
Visit Information  Goals Addressed              This Visit's Progress     RNCM: Pt-"I have had a lot of falls and I have to be careful" (pt-stated)        CARE PLAN ENTRY (see longitudinal plan of care for additional care plan information)  Current Barriers:   Knowledge Deficits related to frequent falls related to knee replacement surgery and osteoarthritis   Chronic Disease Management support and education needs related to osteoarthritis and balance issues in a patient with frequent falls related to knee replacement   Nurse Case Manager Clinical Goal(s):   Over the next 120 days, patient will verbalize understanding of plan for fall prevention and using caution when ambulating  Over the next 120 days, patient will work with [facility] and pcp to address needs related to balance concerns in patient with former knee replacement surgery and poor balance since surgery putting the patient at an increase risk for falls  Over the next 120 days, patient will demonstrate a decrease in fall exacerbations as evidenced by no new falls and no injuries related to falls  Over the next 120 days, patient will attend all scheduled medical appointments: follow up with pcp in December and other providers prn  Over the next 120 days, patient will demonstrate improved health management independence as evidenced byno new falls related to impaired mobility and balance  Interventions:   Inter-disciplinary care team collaboration (see longitudinal plan of care)  Evaluation of current treatment plan related to fall precautions and patient's adherence to plan as established by provider.  Advised patient to report any falls to the [facility] or pcp  Evaluation on any new falls. The patient denies any new falls. States she has been doing well and remaining safe.   Provided education to patient re: safety in the home: o Changing position slowly o Being safe when ambulating- the patient does not use a cane or a  walker when ambulating, states she would fall over it o Removing scatter rugs o Having well lite hallways and surroundings o Wearing non-slip shoes or socks when ambulating  Discussed plans with patient for ongoing care management follow up and provided patient with direct contact information for care management team  Patient Self Care Activities:   Patient verbalizes understanding of plan to work with the CCM team to meet her health and wellness goals  Self administers medications as prescribed  Attends all scheduled provider appointments  Performs IADL's independently  Calls provider office for new concerns or questions  Unable to independently manage falls in her home environment  Please see past updates related to this goal by clicking on the "Past Updates" button in the selected goal        RNCM: Pt-"I have several health problems" (pt-stated)        CARE PLAN ENTRY (see longtitudinal plan of care for additional care plan information)  Current Barriers:   Chronic Disease Management support, education, and care coordination needs related to HLD, COPD, Osteoarthritis and PSVT  Clinical Goal(s) related to HLD, COPD, Osteoarthritis and PSVT:  Over the next 120 days, patient will:   Work with the care management team to address educational, disease management, and care coordination needs   Begin or continue self health monitoring activities as directed today adhere to heart healthy diet and practice safety in ambulation to prevent falls  Call provider office for new or worsened signs and symptoms Blood pressure findings outside  established parameters, Oxygen saturation lower than established parameter, Chest pain, Shortness of breath, and New or worsened symptom related to COPD, Osteoarthritis or other chronic conditions.   Call care management team with questions or concerns  Verbalize basic understanding of patient centered plan of care established today  Interventions  related to HLD, COPD, Osteoarthritis and PSVT:   Evaluation of current treatment plans and patient's adherence to plan as established by provider.  The patient feels she has good control of her chronic conditions at this time  Assessed patient understanding of disease states.  Patient verbalized she understands her disease process and how to effectively manage  Assessed patient's education and care coordination needs.  Denies any concerns at this time. 09-20-2019: Review and education of education needs. The patient states she is doing well. Is maintaining her health and well being.   Provided disease specific education to patient.  The patient endorses following a heart healthy diet, taking medications as ordered, and following plan of care. 09-20-2019: the patient is eating fresh fruits and vegetables from the garden. The patient states that she takes her blood pressure at home and it has been doing good. Denies any issues or concerns at this time.   Collaborated with appropriate clinical care team members regarding patient needs. The patient denies any needs from the LCSW and Pharmacist but knows they are available.   Evaluation of upcoming appointments. The patient sees the pcp again 02-07-2019.  Knows to call the [facility] for changes or concerns.   Patient Self Care Activities related to HLD, COPD, Osteoarthritis and PSVT:   Patient is unable to independently self-manage chronic health conditions  Please see past updates related to this goal by clicking on the "Past Updates" button in the selected goal         Patient verbalizes understanding of instructions provided today.   Telephone follow up appointment with care management team member scheduled for: 12-13-2019 at 3:15 pm  Noreene Larsson RN, MSN, [employer] Mobile: [phone]

## 2019-09-20 NOTE — Chronic Care Management (AMB) (Signed)
Chronic Care Management   Follow Up Note   07/22/6376 Name: Jenna [name] MRN: [id number] DOB: 11-22-1935  Referred by: Olin Hauser, DO Reason for referral : Chronic Care Management (RNCM Chronic disease management and Care Coordination needs )   Jenna [name] is a 84 y.o. year old female who is a primary care patient of Olin Hauser, DO. The CCM team was consulted for assistance with chronic disease management and care coordination needs.    Review of patient status, including review of consultants reports, relevant laboratory and other test results, and collaboration with appropriate care team members and the patient's provider was performed as part of comprehensive patient evaluation and provision of chronic care management services.    SDOH (Social Determinants of Health) assessments performed: Yes See Care Plan activities for detailed interventions related to [facility])     Outpatient Encounter Medications as of 09/20/2019  Medication Sig  . albuterol (VENTOLIN HFA) 108 (90 Base) MCG/ACT inhaler Inhale 2 puffs into the lungs every 6 (six) hours as needed for wheezing or shortness of breath.  Marland Kitchen alendronate (FOSAMAX) 70 MG tablet Take 1 tablet (70 mg total) by mouth once a week.  . Ascorbic Acid (VITAMIN C) 1000 MG tablet Take 500 mg by mouth daily.   Marland Kitchen BREO ELLIPTA 100-25 MCG/INH AEPB 1 puff daily.  . Cholecalciferol (VITAMIN D-1000 MAX ST) 1000 UNITS tablet Take 1,000 Units by mouth daily.   . citalopram (CELEXA) 40 MG tablet Take 1 tablet (40 mg total) by mouth daily.  . clotrimazole-betamethasone (LOTRISONE) cream Apply 1-2 times a day for worsening flare dry skin dermatitis of toes/feet, may re-use daily up to 1 week as needed.  . Cyanocobalamin (RA VITAMIN B-12 TR) 1000 MCG TBCR Take 1,000 mcg by mouth daily.   . diclofenac sodium (VOLTAREN) 1 % GEL Apply 2 g topically 3 (three) times daily as needed. For hands, arthritis  . famotidine (PEPCID) 20 MG  tablet Take 20 mg by mouth 2 (two) times daily.  . ferrous sulfate 325 (65 FE) MG EC tablet Take 325 mg by mouth at bedtime.   . furosemide (LASIX) 20 MG tablet Take 1 tablet (20 mg total) by mouth daily.  Marland Kitchen ipratropium (ATROVENT) 0.02 % nebulizer solution Take 0.5 mg by nebulization 4 (four) times daily.  . methocarbamol (ROBAXIN) 500 MG tablet Take 1 tablet (500 mg total) by mouth every 8 (eight) hours as needed for muscle spasms.  . metoprolol succinate (TOPROL XL) 25 MG 24 hr tablet Take 1 tablet (25 mg total) by mouth daily.  . Multiple Vitamins-Minerals (ONE-A-DAY WOMENS PETITES) TABS Take 2 tablets by mouth every morning.  . Omega-3 Fatty Acids (FISH OIL) 1000 MG CAPS Take 1,000 mg by mouth daily.  . Pediatric Multiple Vit-C-FA (CHEWABLE VITE CHILDRENS) CHEW Chew by mouth daily. occassionally  . simvastatin (ZOCOR) 40 MG tablet Take 1 tablet (40 mg total) by mouth daily.  Marland Kitchen terbinafine (LAMISIL) 250 MG tablet Take 1 tablet (250 mg total) by mouth daily.  . traZODone (DESYREL) 50 MG tablet Take 1 tablet (50 mg total) by mouth at bedtime.   No facility-administered encounter medications on file as of 09/20/2019.     Objective:  BP Readings from Last 3 Encounters:  08/06/19 (!) 128/56  06/03/19 (!) 122/46  05/31/19 120/64    Goals Addressed              This Visit's Progress   .  RNCM: Pt-"I have had a lot of  falls and I have to be careful" (pt-stated)        CARE PLAN ENTRY (see longitudinal plan of care for additional care plan information)  Current Barriers:  Marland Kitchen Knowledge Deficits related to frequent falls related to knee replacement surgery and osteoarthritis  . Chronic Disease Management support and education needs related to osteoarthritis and balance issues in a patient with frequent falls related to knee replacement   Nurse Case Manager Clinical Goal(s):  Marland Kitchen Over the next 120 days, patient will verbalize understanding of plan for fall prevention and using caution when  ambulating . Over the next 120 days, patient will work with [facility] and pcp to address needs related to balance concerns in patient with former knee replacement surgery and poor balance since surgery putting the patient at an increase risk for falls . Over the next 120 days, patient will demonstrate a decrease in fall exacerbations as evidenced by no new falls and no injuries related to falls . Over the next 120 days, patient will attend all scheduled medical appointments: follow up with pcp in December and other providers prn . Over the next 120 days, patient will demonstrate improved health management independence as evidenced byno new falls related to impaired mobility and balance  Interventions:  . Inter-disciplinary care team collaboration (see longitudinal plan of care) . Evaluation of current treatment plan related to fall precautions and patient's adherence to plan as established by provider. . Advised patient to report any falls to the [facility] or pcp . Evaluation on any new falls. The patient denies any new falls. States she has been doing well and remaining safe.  . Provided education to patient re: safety in the home: o Changing position slowly o Being safe when ambulating- the patient does not use a cane or a walker when ambulating, states she would fall over it o Removing scatter rugs o Having well lite hallways and surroundings o Wearing non-slip shoes or socks when ambulating . Discussed plans with patient for ongoing care management follow up and provided patient with direct contact information for care management team  Patient Self Care Activities:  . Patient verbalizes understanding of plan to work with the CCM team to meet her health and wellness goals . Self administers medications as prescribed . Attends all scheduled provider appointments . Performs IADL's independently . Calls provider office for new concerns or questions . Unable to independently manage falls in her home  environment  Please see past updates related to this goal by clicking on the "Past Updates" button in the selected goal      .  RNCM: Pt-"I have several health problems" (pt-stated)        CARE PLAN ENTRY (see longtitudinal plan of care for additional care plan information)  Current Barriers:  . Chronic Disease Management support, education, and care coordination needs related to HLD, COPD, Osteoarthritis and PSVT  Clinical Goal(s) related to HLD, COPD, Osteoarthritis and PSVT:  Over the next 120 days, patient will:  . Work with the care management team to address educational, disease management, and care coordination needs  . Begin or continue self health monitoring activities as directed today adhere to heart healthy diet and practice safety in ambulation to prevent falls . Call provider office for new or worsened signs and symptoms Blood pressure findings outside established parameters, Oxygen saturation lower than established parameter, Chest pain, Shortness of breath, and New or worsened symptom related to COPD, Osteoarthritis or other chronic conditions.  . Call care management  team with questions or concerns . Verbalize basic understanding of patient centered plan of care established today  Interventions related to HLD, COPD, Osteoarthritis and PSVT:  . Evaluation of current treatment plans and patient's adherence to plan as established by provider.  The patient feels she has good control of her chronic conditions at this time . Assessed patient understanding of disease states.  Patient verbalized she understands her disease process and how to effectively manage . Assessed patient's education and care coordination needs.  Denies any concerns at this time. 09-20-2019: Review and education of education needs. The patient states she is doing well. Is maintaining her health and well being.  . Provided disease specific education to patient.  The patient endorses following a heart healthy  diet, taking medications as ordered, and following plan of care. 09-20-2019: the patient is eating fresh fruits and vegetables from the garden. The patient states that she takes her blood pressure at home and it has been doing good. Denies any issues or concerns at this time.  Nash Dimmer with appropriate clinical care team members regarding patient needs. The patient denies any needs from the LCSW and Pharmacist but knows they are available.  . Evaluation of upcoming appointments. The patient sees the pcp again 02-07-2019.  Knows to call the [facility] for changes or concerns.   Patient Self Care Activities related to HLD, COPD, Osteoarthritis and PSVT:  . Patient is unable to independently self-manage chronic health conditions  Please see past updates related to this goal by clicking on the "Past Updates" button in the selected goal          Plan:   Telephone follow up appointment with care management team member scheduled for: 12-13-2019 at 3:15 pm   Noreene Larsson RN, MSN, [place] Mobile: [phone]

## 2019-10-18 ENCOUNTER — Other Ambulatory Visit: Payer: Self-pay | Admitting: Cardiovascular Disease

## 2019-10-22 ENCOUNTER — Other Ambulatory Visit: Payer: Self-pay | Admitting: Family Medicine

## 2019-10-22 DIAGNOSIS — E1169 Type 2 diabetes mellitus with other specified complication: Secondary | ICD-10-CM

## 2019-10-22 DIAGNOSIS — R600 Localized edema: Secondary | ICD-10-CM

## 2019-10-22 DIAGNOSIS — F331 Major depressive disorder, recurrent, moderate: Secondary | ICD-10-CM

## 2019-10-22 MED ORDER — SIMVASTATIN 40 MG PO TABS: 40.0000 mg | ORAL_TABLET | Freq: Every day | ORAL | 1 refills | Status: DC

## 2019-10-22 MED ORDER — CITALOPRAM HYDROBROMIDE 40 MG PO TABS: 40.0000 mg | ORAL_TABLET | Freq: Every day | ORAL | 1 refills | Status: DC

## 2019-10-22 MED ORDER — FUROSEMIDE 20 MG PO TABS: 20.0000 mg | ORAL_TABLET | Freq: Every day | ORAL | 1 refills | Status: DC

## 2019-10-29 ENCOUNTER — Other Ambulatory Visit: Payer: Self-pay | Admitting: Family Medicine

## 2019-10-29 DIAGNOSIS — R600 Localized edema: Secondary | ICD-10-CM

## 2019-10-29 MED ORDER — FUROSEMIDE 20 MG PO TABS: 20.0000 mg | ORAL_TABLET | Freq: Every day | ORAL | 1 refills | Status: DC

## 2019-10-29 NOTE — Telephone Encounter (Signed)
Requested Prescriptions  Pending Prescriptions Disp Refills  . furosemide (LASIX) 20 MG tablet 90 tablet 1    Sig: Take 1 tablet (20 mg total) by mouth daily.     Cardiovascular:  Diuretics - Loop Failed - 10/29/2019  4:38 PM      Failed - Cr in normal range and within 360 days    Creat  Date Value Ref Range Status  08/16/2019 0.94 (H) 0.60 - 0.88 mg/dL Final    Comment:    For patients >82 years of age, the reference limit for Creatinine is approximately 13% higher for people identified as African-American. .          Passed - K in normal range and within 360 days    Potassium  Date Value Ref Range Status  08/16/2019 4.6 3.5 - 5.3 mmol/L Final  03/29/2012 4.3 3.5 - 5.1 mmol/L Final         Passed - Ca in normal range and within 360 days    Calcium  Date Value Ref Range Status  08/16/2019 8.7 8.6 - 10.4 mg/dL Final   Calcium, Total  Date Value Ref Range Status  03/29/2012 8.4 (L) 8.5 - 10.1 mg/dL Final         Passed - Na in normal range and within 360 days    Sodium  Date Value Ref Range Status  08/16/2019 138 135 - 146 mmol/L Final  03/29/2012 138 136 - 145 mmol/L Final         Passed - Last BP in normal range    BP Readings from Last 1 Encounters:  08/06/19 (!) 128/56         Passed - Valid encounter within last 6 months    Recent Outpatient Visits          2 months ago Controlled type 2 diabetes mellitus with diabetic nephropathy, without long-term current use of insulin ([place])   [facility] Olin Hauser, DO   4 months ago Acute left-sided low back pain without sciatica   [facility] Olin Hauser, DO   9 months ago Annual physical exam   [place], Devonne Doughty, DO   1 year ago Controlled type 2 diabetes mellitus with diabetic nephropathy, without long-term current use of insulin [facility])   [place], Devonne Doughty, DO   1 year ago Controlled type  2 diabetes mellitus with diabetic nephropathy, without long-term current use of insulin ([place])   [place], DO      Future Appointments            In 3 months [place], Devonne Doughty, DO [facility] [place] called and requested Rx resend. They did not receive order 10/22/19

## 2019-10-29 NOTE — Telephone Encounter (Signed)
Medication: furosemide (LASIX) 20 MG tablet [[id number]] - Pharmacy is calling to request if this can be resent   Has the patient contacted their pharmacy? YES  (Agent: If no, request that the patient contact the pharmacy for the refill.) (Agent: If yes, when and what did the pharmacy advise?)  Preferred Pharmacy (with phone number or street name): [place], [place], [place]  [address] [place] [address]  Phone:  [phone] Fax:  [phone]   Agent: Please be advised that RX refills may take up to 3 business days. We ask that you follow-up with your pharmacy.

## 2019-11-16 ENCOUNTER — Other Ambulatory Visit: Payer: Self-pay | Admitting: Family Medicine

## 2019-11-16 DIAGNOSIS — F5104 Psychophysiologic insomnia: Secondary | ICD-10-CM

## 2019-11-16 MED ORDER — TRAZODONE HCL 50 MG PO TABS: 50.0000 mg | ORAL_TABLET | Freq: Every day | ORAL | 5 refills | Status: DC

## 2019-11-25 ENCOUNTER — Ambulatory Visit (INDEPENDENT_AMBULATORY_CARE_PROVIDER_SITE_OTHER): Payer: Medicare Other | Admitting: Family Medicine

## 2019-11-25 ENCOUNTER — Other Ambulatory Visit: Payer: Self-pay

## 2019-11-25 ENCOUNTER — Encounter: Payer: Self-pay | Admitting: Family Medicine

## 2019-11-25 ENCOUNTER — Ambulatory Visit
Admission: RE | Admit: 2019-11-25 | Discharge: 2019-11-25 | Disposition: A | Discharge status: Home / Self Care | Payer: Medicare Other | Source: Ambulatory Visit | Attending: Family Medicine | Admitting: Family Medicine

## 2019-11-25 VITALS — BP 127/45 | HR 54 | Temp 97.3°F | Resp 16 | Ht 63.0 in | Wt 210.6 lb

## 2019-11-25 DIAGNOSIS — M79662 Pain in left lower leg: Secondary | ICD-10-CM | POA: Insufficient documentation

## 2019-11-25 DIAGNOSIS — M7989 Other specified soft tissue disorders: Secondary | ICD-10-CM | POA: Insufficient documentation

## 2019-11-25 DIAGNOSIS — M79604 Pain in right leg: Secondary | ICD-10-CM | POA: Diagnosis not present

## 2019-11-25 DIAGNOSIS — M79661 Pain in right lower leg: Secondary | ICD-10-CM

## 2019-11-25 DIAGNOSIS — R600 Localized edema: Secondary | ICD-10-CM | POA: Diagnosis not present

## 2019-11-25 DIAGNOSIS — M7122 Synovial cyst of popliteal space [Baker], left knee: Secondary | ICD-10-CM | POA: Diagnosis not present

## 2019-11-25 LAB — CBC WITH DIFFERENTIAL/PLATELET
Absolute Monocytes: 476 cells/uL (ref 200–950)
Basophils Absolute: 48 cells/uL (ref 0–200)
Basophils Relative: 0.7 %
Eosinophils Absolute: 170 cells/uL (ref 15–500)
Eosinophils Relative: 2.5 %
HCT: 42.1 % (ref 35.0–45.0)
Hemoglobin: 14 g/dL (ref 11.7–15.5)
Lymphs Abs: 2326 cells/uL (ref 850–3900)
MCH: 32.6 pg (ref 27.0–33.0)
MCHC: 33.3 g/dL (ref 32.0–36.0)
MCV: 97.9 fL (ref 80.0–100.0)
MPV: 11 fL (ref 7.5–12.5)
Monocytes Relative: 7 %
Neutro Abs: 3781 cells/uL (ref 1500–7800)
Neutrophils Relative %: 55.6 %
Platelets: 235 10*3/uL (ref 140–400)
RBC: 4.3 10*6/uL (ref 3.80–5.10)
RDW: 11.9 % (ref 11.0–15.0)
Total Lymphocyte: 34.2 %
WBC: 6.8 10*3/uL (ref 3.8–10.8)

## 2019-11-25 LAB — COMPLETE METABOLIC PANEL WITH GFR
AG Ratio: 1.9 (calc) (ref 1.0–2.5)
ALT: 20 U/L (ref 6–29)
AST: 21 U/L (ref 10–35)
Albumin: 4.3 g/dL (ref 3.6–5.1)
Alkaline phosphatase (APISO): 71 U/L (ref 37–153)
BUN/Creatinine Ratio: 20 (calc) (ref 6–22)
BUN: 19 mg/dL (ref 7–25)
CO2: 29 mmol/L (ref 20–32)
Calcium: 9.9 mg/dL (ref 8.6–10.4)
Chloride: 103 mmol/L (ref 98–110)
Creat: 0.95 mg/dL — ABNORMAL HIGH (ref 0.60–0.88)
GFR, Est African American: 65 mL/min/{1.73_m2} (ref >=60)
GFR, Est Non African American: 56 mL/min/{1.73_m2} — ABNORMAL LOW (ref >=60)
Globulin: 2.3 g/dL (calc) (ref 1.9–3.7)
Glucose, Bld: 132 mg/dL — ABNORMAL HIGH (ref 65–99)
Potassium: 4.1 mmol/L (ref 3.5–5.3)
Sodium: 141 mmol/L (ref 135–146)
Total Bilirubin: 0.4 mg/dL (ref 0.2–1.2)
Total Protein: 6.6 g/dL (ref 6.1–8.1)

## 2019-11-25 MED ORDER — CEPHALEXIN 500 MG PO CAPS: 500.0000 mg | ORAL_CAPSULE | Freq: Three times a day (TID) | ORAL | 0 refills | Status: DC

## 2019-11-25 NOTE — Addendum Note (Signed)
Addended by: Olin Hauser on: 11/25/2019 04:12 PM   Modules accepted: Orders

## 2019-11-25 NOTE — Progress Notes (Addendum)
Subjective:    Patient ID: Jenna [name], female    DOB: November 19, 1935, 84 y.o.   MRN: [id number]  Kale Dols is a 84 y.o. female presenting on 11/25/2019 for Joint Swelling (left side onset week --pain--red and warm as per patient )   HPI   Left Lower Extremity Edema Pain redness Chronic swelling of lower extremity, now recent acute flare worsening Left worse than Right, for past 1 week with foot ankle calf swelling pain to touch redness, today slightly improved, she has been sedentary, no prior DVT or PE in past. Not on blood thinner. She takes furosemide 20mg  daily with some good results but not resolving this problem. No cut or scrap or abrasion Denies dyspnea or cough or chest pain, fever chills sweats   Depression screen [facility] 2/9 08/06/2019 05/24/2019 02/01/2019  Decreased Interest 1 0 1  Down, Depressed, Hopeless 0 0 0  PHQ - 2 Score 1 0 1  Altered sleeping 1 - 2  Tired, decreased energy 2 - 1  Change in appetite 0 - 0  Feeling bad or failure about yourself  0 - 0  Trouble concentrating 0 - 0  Moving slowly or fidgety/restless 0 - 0  Suicidal thoughts 0 - 0  PHQ-9 Score 4 - 4  Difficult doing work/chores Not difficult at all - Somewhat difficult    Social History   Tobacco Use  . Smoking status: Former Smoker    Packs/day: 1.00    Years: 20.00    Pack years: 20.00    Types: Cigarettes    Quit date: 07/1981    Years since quitting: 38.3  . Smokeless tobacco: Former Network engineer Use Topics  . Alcohol use: Not Currently    Comment: past  . Drug use: No    Review of Systems Per HPI unless specifically indicated above     Objective:    BP (!) 127/45   Pulse (!) 54   Temp (!) 97.3 F (36.3 C) (Temporal)   Resp 16   Ht 5\' 3"  (1.6 m)   Wt 210 lb 9.6 oz (95.5 kg)   SpO2 97%   BMI 37.31 kg/m   Wt Readings from Last 3 Encounters:  11/25/19 210 lb 9.6 oz (95.5 kg)  08/06/19 210 lb 9.6 oz (95.5 kg)  06/03/19 203 lb (92.1 kg)    Physical Exam Vitals  and nursing note reviewed.  Constitutional:      General: She is not in acute distress.    Appearance: She is well-developed. She is not diaphoretic.     Comments: Well-appearing, comfortable, cooperative  HENT:     Head: Normocephalic and atraumatic.  Eyes:     General:        Right eye: No discharge.        Left eye: No discharge.     Conjunctiva/sclera: Conjunctivae normal.  Neck:     Thyroid: No thyromegaly.  Cardiovascular:     Rate and Rhythm: Normal rate and regular rhythm.     Heart sounds: Normal heart sounds. No murmur heard.   Pulmonary:     Effort: Pulmonary effort is normal. No respiratory distress.     Breath sounds: Normal breath sounds. No wheezing or rales.  Musculoskeletal:        General: Normal range of motion.     Cervical back: Normal range of motion and neck supple.     Right lower leg: Edema present.     Left lower leg: Edema (  Left lower extremity foot ankle and calf with notable edema 46 cm circumference with tenderness along calf mild erythema warmth) present.  Lymphadenopathy:     Cervical: No cervical adenopathy.  Skin:    General: Skin is warm and dry.     Findings: No erythema or rash.  Neurological:     Mental Status: She is alert and oriented to person, place, and time.  Psychiatric:        Behavior: Behavior normal.     Comments: Well groomed, good eye contact, normal speech and thoughts        Results for orders placed or performed in visit on 08/09/19  COMPLETE METABOLIC PANEL WITH GFR  Result Value Ref Range   Glucose, Bld 118 (H) 65 - 99 mg/dL   BUN 18 7 - 25 mg/dL   Creat 0.94 (H) 0.60 - 0.88 mg/dL   GFR, Est Non African American 56 (L) > OR = 60 mL/min/1.83m2   GFR, Est African American 65 > OR = 60 mL/min/1.39m2   BUN/Creatinine Ratio 19 6 - 22 (calc)   Sodium 138 135 - 146 mmol/L   Potassium 4.6 3.5 - 5.3 mmol/L   Chloride 102 98 - 110 mmol/L   CO2 26 20 - 32 mmol/L   Calcium 8.7 8.6 - 10.4 mg/dL   Total Protein 6.0 (L)  6.1 - 8.1 g/dL   Albumin 4.0 3.6 - 5.1 g/dL   Globulin 2.0 1.9 - 3.7 g/dL (calc)   AG Ratio 2.0 1.0 - 2.5 (calc)   Total Bilirubin 0.6 0.2 - 1.2 mg/dL   Alkaline phosphatase (APISO) 60 37 - 153 U/L   AST 24 10 - 35 U/L   ALT 30 (H) 6 - 29 U/L  CBC with Differential/Platelet  Result Value Ref Range   WBC 6.7 3.8 - 10.8 Thousand/uL   RBC 4.06 3.80 - 5.10 Million/uL   Hemoglobin 13.0 11.7 - 15.5 g/dL   HCT 39.7 35 - 45 %   MCV 97.8 80.0 - 100.0 fL   MCH 32.0 27.0 - 33.0 pg   MCHC 32.7 32.0 - 36.0 g/dL   RDW 11.8 11.0 - 15.0 %   Platelets 196 140 - 400 Thousand/uL   MPV 10.8 7.5 - 12.5 fL   Neutro Abs 3,082 1,500 - 7,800 cells/uL   Lymphs Abs 2,841 850 - 3,900 cells/uL   Absolute Monocytes 509 200 - 950 cells/uL   Eosinophils Absolute 228 15.0 - 500.0 cells/uL   Basophils Absolute 40 0.0 - 200.0 cells/uL   Neutrophils Relative % 46 %   Total Lymphocyte 42.4 %   Monocytes Relative 7.6 %   Eosinophils Relative 3.4 %   Basophils Relative 0.6 %  Hemoglobin A1c  Result Value Ref Range   Hgb A1c MFr Bld 6.3 (H) <5.7 % of total Hgb   Mean Plasma Glucose 134 (calc)   eAG (mmol/L) 7.4 (calc)      Assessment & Plan:   Problem List Items Addressed This Visit    None    Visit Diagnoses    Pain and swelling of left lower leg    -  Primary   Relevant Orders   CBC with Differential/Platelet   COMPLETE METABOLIC PANEL WITH GFR   US Venous Img Lower Bilateral (DVT)   Pain and swelling of right lower leg       Relevant Orders   CBC with Differential/Platelet   COMPLETE METABOLIC PANEL WITH GFR   US Venous Img Lower Bilateral (DVT)  Concern acute on chronic worse Left lower leg edema with redness pain, tenderness along calf Cannot rule out DVT Sedentary but no other known provoking cause Elevated Well's Score higher risk of DVT based on symptoms on exam. Possible cellulitis vs stasis dermatitis known chronic swelling  Check STAT CBC CMET Increase Furosemide 20mg  to 20mg  BID  or 40mg  daily for 3-5 days, RICE therapy Offer Keflex antibiotic course for empiric cellulitis therapy Order STAT venous doppler bilateral lower legs, to be scheduled and done today if not consider Urgent Care visit today F/u results  **Addendum  405pm notified by Radiology US doppler results available, I spoke directly with patient, reviewed result. NEGATIVE for DVT and labs CBC / CMET unremarkable, no sign of infection WBC  Discussed options - I recommend offer Keflex 500 TID x 7 days for empiric cellulitis coverage, she will consider this and pick up from pharmacy if ready to take it if not improving as advised worse pain red swelling fever warmth. If gradual improve on inc dose furosemide then can hold antibiotic.  Follow up criteria reviewed  CLINICAL DATA:  Bilateral lower extremity pain and edema, left greater than right. Evaluate for DVT  EXAM: BILATERAL LOWER EXTREMITY VENOUS DOPPLER ULTRASOUND  TECHNIQUE: Gray-scale sonography with graded compression, as well as color Doppler and duplex ultrasound were performed to evaluate the lower extremity deep venous systems from the level of the common femoral vein and including the common femoral, femoral, profunda femoral, popliteal and calf veins including the posterior tibial, peroneal and gastrocnemius veins when visible. The superficial great saphenous vein was also interrogated. Spectral Doppler was utilized to evaluate flow at rest and with distal augmentation maneuvers in the common femoral, femoral and popliteal veins.  COMPARISON:  None.  FINDINGS: RIGHT LOWER EXTREMITY  Common Femoral Vein: No evidence of thrombus. Normal compressibility, respiratory phasicity and response to augmentation.  Saphenofemoral Junction: No evidence of thrombus. Normal compressibility and flow on color Doppler imaging.  Profunda Femoral Vein: No evidence of thrombus. Normal compressibility and flow on color Doppler  imaging.  Femoral Vein: No evidence of thrombus. Normal compressibility, respiratory phasicity and response to augmentation.  Popliteal Vein: No evidence of thrombus. Normal compressibility, respiratory phasicity and response to augmentation.  Calf Veins: No evidence of thrombus. Normal compressibility and flow on color Doppler imaging.  Superficial Great Saphenous Vein: No evidence of thrombus. Normal compressibility.  Venous Reflux:  None.  Other Findings:  None.  LEFT LOWER EXTREMITY  Common Femoral Vein: No evidence of thrombus. Normal compressibility, respiratory phasicity and response to augmentation.  Saphenofemoral Junction: No evidence of thrombus. Normal compressibility and flow on color Doppler imaging.  Profunda Femoral Vein: No evidence of thrombus. Normal compressibility and flow on color Doppler imaging.  Femoral Vein: No evidence of thrombus. Normal compressibility, respiratory phasicity and response to augmentation.  Popliteal Vein: No evidence of thrombus. Normal compressibility, respiratory phasicity and response to augmentation.  Calf Veins: Appear patent where visualized.  Superficial Great Saphenous Vein: No evidence of thrombus. Normal compressibility.  Venous Reflux:  None.  Other Findings: Note is made of an approximately 3.2 x 0.9 x 2.0 cm fluid collection left popliteal fossa compatible with a Baker's cyst.  IMPRESSION: 1. No evidence of DVT within either lower extremity. 2. Incidental note made of an approximately 3.2 cm left-sided Baker's cyst.   Electronically Signed   By: Sandi Mariscal M.D.   On: 11/25/2019 15:59      Exam Ended: 11/25/19 15:50  No orders of the defined types were placed in this encounter.   Orders Placed This Encounter  Procedures  . US Venous Img Lower Bilateral (DVT)    Hold patient until results reviewed    Scheduling Instructions:     Hold patient until results reviewed     Order Specific Question:   Reason for Exam (SYMPTOM  OR DIAGNOSIS REQUIRED)    Answer:   left worse than right, significant worse swelling pain and redness, pain in half 1 week, no history DVT    Order Specific Question:   Preferred imaging location?    Answer:   [facility]    Order Specific Question:   Call Results- Best Contact Number?    Answer:   [phone]...Marland Kitchenhold pt   . CBC with Differential/Platelet  . COMPLETE METABOLIC PANEL WITH GFR     Follow up plan: Return in about 1 week (around 12/02/2019), or if symptoms worsen or fail to improve, for leg swelling .   Nobie Putnam, [facility] 11/25/2019, 9:41 AM

## 2019-11-25 NOTE — Patient Instructions (Addendum)
Thank you for coming to the office today.  If the ultrasound is negative, no blood clot  Can double your furosemide lasix take 2 pills or 1 pill twice a day for about 3-5 days and elevate legs.  Would likely add an antibiotic Keflex as well.  If worsening pain or swelling or breathing - need to be seen at urgent care / hospital  STAT blood results stay tuned.  Please schedule a Follow-up Appointment to: Return in about 1 week (around 12/02/2019), or if symptoms worsen or fail to improve, for leg swelling .  If you have any other questions or concerns, please feel free to call the office or send a message through [place]. You may also schedule an earlier appointment if necessary.  Additionally, you may be receiving a survey about your experience at our office within a few days to 1 week by e-mail or mail. We value your feedback.  Nobie Putnam, DO [place]

## 2019-11-29 ENCOUNTER — Telehealth: Payer: Self-pay | Admitting: Family Medicine

## 2019-11-29 NOTE — Telephone Encounter (Signed)
Patient advised if she feels better and swelling went down then she does not need follow up appointment.

## 2019-11-29 NOTE — Telephone Encounter (Signed)
Patient is calling to let Dr. Raliegh Ip know that her leg swelling has really come down after taking the two fluid pills a day. Patient states if she needs to do a follow up visit she will Please advise CB- [phone]

## 2019-12-13 ENCOUNTER — Telehealth: Payer: Self-pay | Admitting: General Practice

## 2019-12-13 ENCOUNTER — Telehealth: Payer: Medicare Other

## 2019-12-13 NOTE — Telephone Encounter (Signed)
  Chronic Care Management   Outreach Note  70/35/0093 Name: Jenna [name] MRN: [id number] DOB: [birth year]  Referred by: Olin Hauser, DO Reason for referral : Chronic Care Management (RNCM Follow up call for Chronic Disease Management and care coordiantion needs)   An unsuccessful telephone outreach was attempted today. The patient was referred to the case management team for assistance with care management and care coordination.   Follow Up Plan: A HIPAA compliant phone message was left for the patient providing contact information and requesting a return call.   Noreene Larsson RN, MSN, [place] Mobile: [phone]

## 2019-12-15 NOTE — Telephone Encounter (Signed)
Pt has been r/s  

## 2019-12-28 ENCOUNTER — Telehealth: Payer: Self-pay

## 2019-12-28 ENCOUNTER — Ambulatory Visit: Payer: Medicare Other

## 2019-12-28 NOTE — Telephone Encounter (Signed)
This nurse attempted to call patient in order to perform scheduled telephonic AWV. Called three times 1315, 1320, and 1330. Message left to make appointment for another time.

## 2019-12-30 ENCOUNTER — Telehealth: Payer: Self-pay | Admitting: Family Medicine

## 2019-12-30 NOTE — Telephone Encounter (Signed)
Copied from [place] [id number]. Topic: Medicare AWV >> Dec 30, 2019  2:07 PM Cher Nakai R wrote: Reason for CRM:  Need to rescheduled missed AWVS  Left message for patient to call back and reschedule the Medicare Annual Wellness Visit (AWV) virtually.  Last AWV 12/22/2018  Please schedule at anytime with [facility].  40 minute appointment  Any questions, please call me at [phone]

## 2020-01-17 ENCOUNTER — Other Ambulatory Visit: Payer: Self-pay | Admitting: Family Medicine

## 2020-01-17 DIAGNOSIS — E1121 Type 2 diabetes mellitus with diabetic nephropathy: Secondary | ICD-10-CM

## 2020-01-17 DIAGNOSIS — Z0000 Encounter for general adult medical examination without abnormal findings: Secondary | ICD-10-CM

## 2020-01-17 DIAGNOSIS — M8949 Other hypertrophic osteoarthropathy, multiple sites: Secondary | ICD-10-CM

## 2020-01-17 DIAGNOSIS — M159 Polyosteoarthritis, unspecified: Secondary | ICD-10-CM

## 2020-01-17 DIAGNOSIS — J432 Centrilobular emphysema: Secondary | ICD-10-CM

## 2020-01-17 DIAGNOSIS — E1169 Type 2 diabetes mellitus with other specified complication: Secondary | ICD-10-CM

## 2020-01-17 DIAGNOSIS — E669 Obesity, unspecified: Secondary | ICD-10-CM

## 2020-01-17 DIAGNOSIS — E785 Hyperlipidemia, unspecified: Secondary | ICD-10-CM

## 2020-01-17 DIAGNOSIS — F331 Major depressive disorder, recurrent, moderate: Secondary | ICD-10-CM

## 2020-01-17 DIAGNOSIS — R7989 Other specified abnormal findings of blood chemistry: Secondary | ICD-10-CM

## 2020-01-18 DIAGNOSIS — H35352 Cystoid macular degeneration, left eye: Secondary | ICD-10-CM | POA: Diagnosis not present

## 2020-01-31 ENCOUNTER — Other Ambulatory Visit: Payer: Self-pay

## 2020-01-31 ENCOUNTER — Other Ambulatory Visit: Payer: Medicare Other

## 2020-01-31 DIAGNOSIS — E1121 Type 2 diabetes mellitus with diabetic nephropathy: Secondary | ICD-10-CM

## 2020-01-31 DIAGNOSIS — E669 Obesity, unspecified: Secondary | ICD-10-CM

## 2020-01-31 DIAGNOSIS — E1169 Type 2 diabetes mellitus with other specified complication: Secondary | ICD-10-CM

## 2020-01-31 DIAGNOSIS — Z0000 Encounter for general adult medical examination without abnormal findings: Secondary | ICD-10-CM

## 2020-01-31 DIAGNOSIS — M8949 Other hypertrophic osteoarthropathy, multiple sites: Secondary | ICD-10-CM

## 2020-01-31 DIAGNOSIS — M150 Primary generalized (osteo)arthritis: Secondary | ICD-10-CM

## 2020-01-31 DIAGNOSIS — M159 Polyosteoarthritis, unspecified: Secondary | ICD-10-CM

## 2020-01-31 DIAGNOSIS — F331 Major depressive disorder, recurrent, moderate: Secondary | ICD-10-CM

## 2020-01-31 DIAGNOSIS — R7989 Other specified abnormal findings of blood chemistry: Secondary | ICD-10-CM | POA: Diagnosis not present

## 2020-01-31 DIAGNOSIS — E785 Hyperlipidemia, unspecified: Secondary | ICD-10-CM | POA: Diagnosis not present

## 2020-02-01 LAB — COMPLETE METABOLIC PANEL WITH GFR
AG Ratio: 1.8 (calc) (ref 1.0–2.5)
ALT: 32 U/L — ABNORMAL HIGH (ref 6–29)
AST: 30 U/L (ref 10–35)
Albumin: 3.9 g/dL (ref 3.6–5.1)
Alkaline phosphatase (APISO): 63 U/L (ref 37–153)
BUN/Creatinine Ratio: 22 (calc) (ref 6–22)
BUN: 20 mg/dL (ref 7–25)
CO2: 31 mmol/L (ref 20–32)
Calcium: 9.3 mg/dL (ref 8.6–10.4)
Chloride: 102 mmol/L (ref 98–110)
Creat: 0.92 mg/dL — ABNORMAL HIGH (ref 0.60–0.88)
GFR, Est African American: 67 mL/min/{1.73_m2} (ref >=60)
GFR, Est Non African American: 58 mL/min/{1.73_m2} — ABNORMAL LOW (ref >=60)
Globulin: 2.2 g/dL (calc) (ref 1.9–3.7)
Glucose, Bld: 146 mg/dL — ABNORMAL HIGH (ref 65–99)
Potassium: 4.2 mmol/L (ref 3.5–5.3)
Sodium: 140 mmol/L (ref 135–146)
Total Bilirubin: 0.5 mg/dL (ref 0.2–1.2)
Total Protein: 6.1 g/dL (ref 6.1–8.1)

## 2020-02-01 LAB — TSH: TSH: 6.08 mIU/L — ABNORMAL HIGH (ref 0.40–4.50)

## 2020-02-01 LAB — CBC WITH DIFFERENTIAL/PLATELET
Absolute Monocytes: 622 cells/uL (ref 200–950)
Basophils Absolute: 42 cells/uL (ref 0–200)
Basophils Relative: 0.5 %
Eosinophils Absolute: 151 cells/uL (ref 15–500)
Eosinophils Relative: 1.8 %
HCT: 40.5 % (ref 35.0–45.0)
Hemoglobin: 13.7 g/dL (ref 11.7–15.5)
Lymphs Abs: 2680 cells/uL (ref 850–3900)
MCH: 32.6 pg (ref 27.0–33.0)
MCHC: 33.8 g/dL (ref 32.0–36.0)
MCV: 96.4 fL (ref 80.0–100.0)
MPV: 10.8 fL (ref 7.5–12.5)
Monocytes Relative: 7.4 %
Neutro Abs: 4906 cells/uL (ref 1500–7800)
Neutrophils Relative %: 58.4 %
Platelets: 246 10*3/uL (ref 140–400)
RBC: 4.2 10*6/uL (ref 3.80–5.10)
RDW: 11.7 % (ref 11.0–15.0)
Total Lymphocyte: 31.9 %
WBC: 8.4 10*3/uL (ref 3.8–10.8)

## 2020-02-01 LAB — HEMOGLOBIN A1C
Hgb A1c MFr Bld: 6.3 % of total Hgb — ABNORMAL HIGH (ref <5.7)
Mean Plasma Glucose: 134 mg/dL
eAG (mmol/L): 7.4 mmol/L

## 2020-02-01 LAB — LIPID PANEL
Cholesterol: 170 mg/dL (ref <200)
HDL: 80 mg/dL (ref >=50)
LDL Cholesterol (Calc): 67 mg/dL (calc)
Non-HDL Cholesterol (Calc): 90 mg/dL (calc) (ref <130)
Total CHOL/HDL Ratio: 2.1 (calc) (ref <5.0)
Triglycerides: 146 mg/dL (ref <150)

## 2020-02-03 ENCOUNTER — Ambulatory Visit: Payer: Self-pay | Admitting: General Practice

## 2020-02-03 ENCOUNTER — Telehealth: Payer: Medicare Other | Admitting: General Practice

## 2020-02-03 DIAGNOSIS — E1169 Type 2 diabetes mellitus with other specified complication: Secondary | ICD-10-CM

## 2020-02-03 DIAGNOSIS — J432 Centrilobular emphysema: Secondary | ICD-10-CM

## 2020-02-03 DIAGNOSIS — E785 Hyperlipidemia, unspecified: Secondary | ICD-10-CM

## 2020-02-03 DIAGNOSIS — R296 Repeated falls: Secondary | ICD-10-CM

## 2020-02-03 NOTE — Chronic Care Management (AMB) (Signed)
Chronic Care Management   CCM RN Visit Note  6/43/3295 Name: Jenna [name] MRN: [id number] DOB: [birth year]  Subjective: Jenna [name] is a 85 y.o. year old female who is a primary care patient of Olin Hauser, DO. The care management team was consulted for assistance with disease management and care coordination needs.    Engaged with patient by telephone for follow up visit in response to provider referral for case management and/or care coordination services.   Consent to Services:  patient currently engaged with CCM team  Patient agreed to services and verbal consent obtained.   Assessment: Review of patient past medical history, allergies, medications, health status, including review of consultants reports, laboratory and other test data, was performed as part of comprehensive evaluation and provision of chronic care management services.   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   alendronate (FOSAMAX) 70 MG tablet Take 1 tablet (70 mg total) by mouth once a week.   Ascorbic Acid (VITAMIN C) 1000 MG tablet Take 500 mg  by mouth daily.    BREO ELLIPTA 100-25 MCG/INH AEPB 1 puff daily.   cephALEXin (KEFLEX) 500 MG capsule Take 1 capsule (500 mg total) by mouth 3 (three) times daily. For 7 days   Cholecalciferol (VITAMIN D-1000 MAX ST) 1000 UNITS tablet Take 1,000 Units by mouth daily.    citalopram (CELEXA) 40 MG tablet Take 1 tablet (40 mg total) by mouth daily.   clotrimazole-betamethasone (LOTRISONE) cream Apply 1-2 times a day for worsening flare dry skin dermatitis of toes/feet, may re-use daily up to 1 week as needed.   Cyanocobalamin (RA VITAMIN B-12 TR) 1000 MCG TBCR Take 1,000 mcg by mouth daily.    diclofenac sodium (VOLTAREN) 1 % GEL Apply 2 g topically 3 (three) times daily as needed. For hands, arthritis   famotidine (PEPCID) 20 MG tablet Take 20 mg by mouth 2 (two) times daily.   ferrous sulfate 325 (65 FE) MG EC tablet Take 325 mg by mouth at bedtime.    furosemide (LASIX) 20 MG tablet Take 1 tablet (20 mg total) by mouth daily.   ipratropium (ATROVENT) 0.02 % nebulizer solution Take 0.5 mg by nebulization 4 (four) times daily.   methocarbamol (ROBAXIN) 500 MG tablet Take 1 tablet (500 mg total) by mouth every 8 (eight) hours as needed for muscle spasms.   metoprolol succinate (TOPROL-XL) 25 MG 24 hr tablet TAKE ONE TABLET BY MOUTH DAILY   Multiple Vitamins-Minerals (ONE-A-DAY WOMENS PETITES) TABS Take 2 tablets by mouth every morning.   Omega-3 Fatty Acids (FISH OIL) 1000 MG CAPS Take 1,000 mg  by mouth daily.   Pediatric Multiple Vit-C-FA (CHEWABLE VITE CHILDRENS) CHEW Chew by mouth daily. occassionally   simvastatin (ZOCOR) 40 MG tablet Take 1 tablet (40 mg total) by mouth daily.   terbinafine (LAMISIL) 250 MG tablet Take 1 tablet (250 mg total) by mouth daily.   traZODone (DESYREL) 50 MG tablet Take 1 tablet (50 mg total) by mouth at bedtime.   No facility-administered encounter medications on file as of 02/03/2020.    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 ([place]) 10/13/2018   Mild nonproliferative diabetic retinopathy of right eye without macular edema ([place]) 10/13/2018   Controlled type 2 diabetes mellitus with diabetic nephropathy, without long-term current use of insulin ([place]) 11/13/2017   Hyperlipidemia associated with type 2 diabetes mellitus ([place]) 11/13/2017   Depression, major, recurrent ([place]) 11/12/2017   Psychophysiological insomnia 11/12/2017   Primary osteoarthritis of both hands 11/12/2017   Primary osteoarthritis involving multiple joints 11/12/2017   Centrilobular emphysema ([place]) 11/12/2017   Obesity (BMI 35.0-39.9 without comorbidity) 11/13/2013   Esophageal reflux 11/13/2013   Bilateral leg edema 11/13/2013   Chronic cough 11/13/2013    Conditions to be addressed/monitored:HLD, COPD and high fall risk  Care Plan : RNCM: COPD (Adult)  Updates made by Vanita Ingles since 02/03/2020 12:00 AM    Problem: RNCM: Psychological Adjustment to Diagnosis (COPD)   Priority: Medium    Goal: RNCM: COPD Adjustment to Disease Achieved   Priority: Medium  Note:   Current Barriers:   Knowledge deficits related to basic understanding of COPD disease process  Knowledge deficits related to basic COPD self care/management  Knowledge deficit related to basic understanding of how to use inhalers and how inhaled medications work  Knowledge deficit related to importance of energy conservation  Limited Social Support  Unable to independently manage COPD  Unable to self administer medications as prescribed  Does not contact provider office for questions/concerns  Case Manager Clinical Goal(s):  Over the next 120 days patient will report using inhalers as prescribed including rinsing mouth after use  Over the  next 120 days patient will report utilizing pursed lip breathing for shortness of breath  Over the next 120 days, patient will be able to verbalize understanding of COPD action plan and when to seek appropriate levels of medical care  Over the next 120 days, patient will engage in lite exercise as tolerated to build/regain stamina and strength and reduce shortness of breath through activity tolerance  Over the next 120 days, patient will verbalize basic understanding of COPD disease process and self care activities  Over the next 120 days, patient will not be hospitalized for COPD exacerbation  Interventions:   Collaboration with Parks Ranger, Devonne Doughty, DO regarding development and update of comprehensive plan of care as evidenced by provider attestation and co-signature  Inter-disciplinary care team collaboration (see longitudinal plan of care)  Provided patient with basic written and verbal COPD education on self care/management/and exacerbation prevention   Provided patient with COPD action plan and reinforced importance of daily self assessment  Provided written and verbal instructions on pursed lip breathing and utilized returned demonstration as teach back  Provided instruction about proper use of medications used for management of COPD including inhalers  Advised patient to self assesses COPD action plan zone and make appointment with provider if  in the yellow zone for 48 hours without improvement.  Provided patient with education about the role of exercise in the management of COPD  Advised patient to engage in light exercise as tolerated 3-5 days a week  Provided education about and advised patient to utilize infection prevention strategies to reduce risk of respiratory infection  Patient Goals/Self-Care Activities:   - counseling provided  - decision-making supported  - depression screen reviewed  - emotional support provided  - problem-solving facilitated  -  relaxation techniques promoted  - verbalization of feelings encouraged Follow Up Plan: Telephone follow up appointment with care management team member scheduled for: 03-30-2020 at 1 pm    Task: RNCM: Support Psychosocial Response to Chronic Obstructive Pulmonary Disease   Note:   Care Management Activities:    - counseling provided - decision-making supported - depression screen reviewed - emotional support provided - problem-solving facilitated - relaxation techniques promoted - verbalization of feelings encouraged       Care Plan : RNCM: HLD Management  Updates made by Vanita Ingles since 02/03/2020 12:00 AM    Problem: RNCM: Health Promotion or Disease Self-Management (General Plan of Care)   Priority: Medium    Goal: RNCM: [id number] Plan Developed   Priority: Medium  Note:   Current Barriers:   Poorly controlled hyperlipidemia, complicated by COPD  Current antihyperlipidemic regimen: Zocor 40 mg daily   Most recent lipid panel:     Component Value Date/Time   CHOL 170 01/31/2020 0756   TRIG 146 01/31/2020 0756   HDL 80 01/31/2020 0756   CHOLHDL 2.1 01/31/2020 0756   LDLCALC 67 01/31/2020 0756     ASCVD risk enhancing conditions: age >16, COPD, Depression  Unable to independently manage HLD   Does not contact provider office for questions/concerns  RN Care Manager Clinical Goal(s):   Over the next 120 days, patient will work with Consulting civil engineer, providers, and care team towards execution of optimized self-health management plan  Over the next 120 days, patient will verbalize understanding of plan for effective management of HLD  Over the next 120 days, patient will work with [facility] and pcp to address needs related to HLD  Over the next 120 days, patient will attend all scheduled medical appointments: 02-07-2020 at 2:20 pm  Interventions:  Collaboration with Olin Hauser, DO regarding development and update of comprehensive plan of  care as evidenced by provider attestation and co-signature  Inter-disciplinary care team collaboration (see longitudinal plan of care)  Medication review performed; medication list updated in electronic medical record.   Inter-disciplinary care team collaboration (see longitudinal plan of care)  Referred to pharmacy team for assistance with HLD medication management  Evaluation of current treatment plan related to HLD and patient's adherence to plan as established by provider.  Advised patient to call the office for changes in condition related to HLD or new concerns  Provided education to patient re: heart healthy diet   Reviewed medications with patient and discussed compliance   Discussed plans with patient for ongoing care management follow up and provided patient with direct contact information for care management team  Reviewed scheduled/upcoming provider appointments including: 02-07-2020 at 2:20 pm Patient Goals/Self-Care Activities:  Over the next 120 days, patient will:   - call for medicine refill 2 or 3 days before it runs out - call if I am sick and can't take my medicine - keep a list of all the medicines I take; vitamins and herbals too - learn  to read medicine labels - use a pillbox to sort medicine - use an alarm clock or phone to remind me to take my medicine - drink 6 to 8 glasses of water each day - eat 3 to 5 servings of fruits and vegetables each day - eat 5 or 6 small meals each day - limit fast food meals to no more than 1 per week - manage portion size - prepare main meal at home 3 to 5 days each week - read food labels for fat, fiber, carbohydrates and portion size - set a realistic goal - be open to making changes - I can manage, know and watch for signs of a heart attack - if I have chest pain, call for help - learn about small changes that will make a big difference - learn my personal risk factors   Follow Up Plan: Telephone follow up  appointment with care management team member scheduled for: 03-30-2020 at 1 pm     Care Plan : RNCM: Falls prevention and safety  Updates made by Vanita Ingles since 02/03/2020 12:00 AM    Problem: RNCM: Falls Prevention, Home and Family Safety (Wellness)   Priority: High  Note:   Last fall on 01-29-2020   Goal: RNCM: Falls, Home and Family Safety Maintained   Start Date: 02/03/2020  Priority: High  Note:   Current Barriers:   Knowledge Deficits related to fall precautions in patient with multiple chronic conditions and decreased mobility  Decreased adherence to prescribed treatment for fall prevention  Unable to independently manage falls in the home setting  Does not adhere to provider recommendations re: use of DME in the home setting   Does not contact provider office for questions/concerns  Knowledge Deficits related to fall preventions and safety in the home  Does not use DME in the home, high fall risk patient Clinical Goal(s):   Over the next 120 days, patient will demonstrate improved adherence to prescribed treatment plan for decreasing falls as evidenced by patient reporting and review of EMR  Over the next 120 days, patient will verbalize using fall risk reduction strategies discussed  Over the next 120 days, patient will not experience additional falls  Over the next 120 days, patient will verbalize understanding of plan for having a safety plan in place at home to prevent new falls   Over the next 120 days, patient will work with [facility], CCM team and pcp to address needs related to factors that may be contributing to falls in the home  Over the next 120 days, patient will demonstrate a decrease in fall exacerbations as evidenced by no new falls or safety concerns   Over the next 120 days, patient will attend all scheduled medical appointments: 02-07-2020 at 2:20 pm  Over the next 120 days, patient will demonstrate understanding of rationale for each prescribed  medication as evidenced by compliance and working with pharmacist if needed to understand medications and review of high safety medication concerns Interventions:   Collaboration with Parks Ranger, Devonne Doughty, DO regarding development and update of comprehensive plan of care as evidenced by provider attestation and co-signature  Inter-disciplinary care team collaboration (see longitudinal plan of care)  Provided written and verbal education re: Potential causes of falls and Fall prevention strategies  Reviewed medications and discussed potential side effects of medications such as dizziness and frequent urination  Assessed for s/s of orthostatic hypotension  Assessed for falls since last encounter.  Assessed patients knowledge of fall risk prevention  secondary to previously provided education.  Assessed working status of life alert bracelet and patient adherence  Provided patient information for fall alert systems  Evaluation of current treatment plan related to falls and safety and patient's adherence to plan as established by provider.  Advised patient to call the office for new falls and discuss falls with the pcp at upcoming visit   Provided education to patient re: safety in the home and the use of DME to help with safety in the home   Reviewed medications with patient and discussed compliance and review of high risk medications   Discussed plans with patient for ongoing care management follow up and provided patient with direct contact information for care management team  Reviewed scheduled/upcoming provider appointments including: 02-07-2020 at 2:20 pm  Patient Goals/Self-Care Activities  Over the next 120 days, patient will:   - Utilize walker, cane, or crutches (assistive device) appropriately with all ambulation - De-clutter walkways - Change positions slowly - Wear secure fitting shoes at all times with ambulation - Utilize home lighting for dim lit areas -  Demonstrate self and pet awareness at all times - plan for unforeseen emergencies (disaster) reviewed - resources needed to improve safety identified - risks to home and environmental safety identified - strategies to improve or maintain safety promoted Follow Up Plan: Telephone follow up appointment with care management team member scheduled for: 03-30-2020 at 1 pm   Task: RNCM: Identify and Reduce Risk to Safety   Note:   Care Management Activities:    - plan for unforeseen emergencies (disaster) reviewed - resources needed to improve safety identified - risks to home and environmental safety identified - strategies to improve or maintain safety promoted         Plan:Telephone follow up appointment with care management team member scheduled for:  03-30-2020 at 1 pm  Noreene Larsson RN, MSN, [place] Mobile: [phone]

## 2020-02-03 NOTE — Patient Instructions (Signed)
Visit Information  Patient Care Plan: RNCM: COPD (Adult)    Problem Identified: RNCM: Psychological Adjustment to Diagnosis (COPD)   Priority: Medium    Goal: RNCM: COPD Adjustment to Disease Achieved   Priority: Medium  Note:   Current Barriers:  Marland Kitchen Knowledge deficits related to basic understanding of COPD disease process . Knowledge deficits related to basic COPD self care/management . Knowledge deficit related to basic understanding of how to use inhalers and how inhaled medications work . Knowledge deficit related to importance of energy conservation . Limited Social Support . Unable to independently manage COPD . Unable to self administer medications as prescribed . Does not contact provider office for questions/concerns  Case Manager Clinical Goal(s):  Over the next 120 days patient will report using inhalers as prescribed including rinsing mouth after use  Over the next 120 days patient will report utilizing pursed lip breathing for shortness of breath  Over the next 120 days, patient will be able to verbalize understanding of COPD action plan and when to seek appropriate levels of medical care  Over the next 120 days, patient will engage in lite exercise as tolerated to build/regain stamina and strength and reduce shortness of breath through activity tolerance  Over the next 120 days, patient will verbalize basic understanding of COPD disease process and self care activities  Over the next 120 days, patient will not be hospitalized for COPD exacerbation  Interventions:  . Collaboration with Olin Hauser, DO regarding development and update of comprehensive plan of care as evidenced by provider attestation and co-signature . Inter-disciplinary care team collaboration (see longitudinal plan of care)  Provided patient with basic written and verbal COPD education on self care/management/and exacerbation prevention   Provided patient with COPD action plan and  reinforced importance of daily self assessment  Provided written and verbal instructions on pursed lip breathing and utilized returned demonstration as teach back  Provided instruction about proper use of medications used for management of COPD including inhalers  Advised patient to self assesses COPD action plan zone and make appointment with provider if in the yellow zone for 48 hours without improvement.  Provided patient with education about the role of exercise in the management of COPD  Advised patient to engage in light exercise as tolerated 3-5 days a week  Provided education about and advised patient to utilize infection prevention strategies to reduce risk of respiratory infection  Patient Goals/Self-Care Activities:  . - counseling provided . - decision-making supported . - depression screen reviewed . - emotional support provided . - problem-solving facilitated . - relaxation techniques promoted . - verbalization of feelings encouraged Follow Up Plan: Telephone follow up appointment with care management team member scheduled for: 03-30-2020 at 1 pm    Task: RNCM: Support Psychosocial Response to Chronic Obstructive Pulmonary Disease   Note:   Care Management Activities:    - counseling provided - decision-making supported - depression screen reviewed - emotional support provided - problem-solving facilitated - relaxation techniques promoted - verbalization of feelings encouraged       Patient Care Plan: RNCM: HLD Management    Problem Identified: RNCM: Health Promotion or Disease Self-Management (General Plan of Care)   Priority: Medium    Goal: RNCM: [id number] Plan Developed   Priority: Medium  Note:   Current Barriers:  . Poorly controlled hyperlipidemia, complicated by COPD . Current antihyperlipidemic regimen: Zocor 40 mg daily  . Most recent lipid panel:     Component Value Date/Time  CHOL 170 01/31/2020 0756   TRIG 146 01/31/2020 0756    HDL 80 01/31/2020 0756   CHOLHDL 2.1 01/31/2020 0756   LDLCALC 67 01/31/2020 0756 .   Marland Kitchen ASCVD risk enhancing conditions: age >17, COPD, Depression . Unable to independently manage HLD  . Does not contact provider office for questions/concerns  RN Care Manager Clinical Goal(s):  Marland Kitchen Over the next 120 days, patient will work with Consulting civil engineer, providers, and care team towards execution of optimized self-health management plan . Over the next 120 days, patient will verbalize understanding of plan for effective management of HLD . Over the next 120 days, patient will work with [facility] and pcp to address needs related to HLD . Over the next 120 days, patient will attend all scheduled medical appointments: 02-07-2020 at 2:20 pm  Interventions: . Collaboration with Olin Hauser, DO regarding development and update of comprehensive plan of care as evidenced by provider attestation and co-signature . Inter-disciplinary care team collaboration (see longitudinal plan of care) . Medication review performed; medication list updated in electronic medical record.  Bertram Savin care team collaboration (see longitudinal plan of care) . Referred to pharmacy team for assistance with HLD medication management . Evaluation of current treatment plan related to HLD and patient's adherence to plan as established by provider. . Advised patient to call the office for changes in condition related to HLD or new concerns . Provided education to patient re: heart healthy diet  . Reviewed medications with patient and discussed compliance  . Discussed plans with patient for ongoing care management follow up and provided patient with direct contact information for care management team . Reviewed scheduled/upcoming provider appointments including: 02-07-2020 at 2:20 pm Patient Goals/Self-Care Activities: . Over the next 120 days, patient will:   - call for medicine refill 2 or 3 days before it runs out -  call if I am sick and can't take my medicine - keep a list of all the medicines I take; vitamins and herbals too - learn to read medicine labels - use a pillbox to sort medicine - use an alarm clock or phone to remind me to take my medicine - drink 6 to 8 glasses of water each day - eat 3 to 5 servings of fruits and vegetables each day - eat 5 or 6 small meals each day - limit fast food meals to no more than 1 per week - manage portion size - prepare main meal at home 3 to 5 days each week - read food labels for fat, fiber, carbohydrates and portion size - set a realistic goal - be open to making changes - I can manage, know and watch for signs of a heart attack - if I have chest pain, call for help - learn about small changes that will make a big difference - learn my personal risk factors   Follow Up Plan: Telephone follow up appointment with care management team member scheduled for: 03-30-2020 at 1 pm        Patient Care Plan: RNCM: Falls prevention and safety    Problem Identified: RNCM: Falls Prevention, Home and Family Safety (Wellness)   Priority: High  Note:   Last fall on 01-29-2020   Goal: RNCM: Falls, Home and Family Safety Maintained   Start Date: 02/03/2020  Priority: High  Note:   Current Barriers:  Marland Kitchen Knowledge Deficits related to fall precautions in patient with multiple chronic conditions and decreased mobility . Decreased adherence to prescribed treatment  for fall prevention . Unable to independently manage falls in the home setting . Does not adhere to provider recommendations re: use of DME in the home setting  . Does not contact provider office for questions/concerns . Knowledge Deficits related to fall preventions and safety in the home . Does not use DME in the home, high fall risk patient Clinical Goal(s):  Marland Kitchen Over the next 120 days, patient will demonstrate improved adherence to prescribed treatment plan for decreasing falls as evidenced by patient  reporting and review of EMR . Over the next 120 days, patient will verbalize using fall risk reduction strategies discussed . Over the next 120 days, patient will not experience additional falls . Over the next 120 days, patient will verbalize understanding of plan for having a safety plan in place at home to prevent new falls  . Over the next 120 days, patient will work with [facility], CCM team and pcp to address needs related to factors that may be contributing to falls in the home . Over the next 120 days, patient will demonstrate a decrease in fall exacerbations as evidenced by no new falls or safety concerns  . Over the next 120 days, patient will attend all scheduled medical appointments: 02-07-2020 at 2:20 pm . Over the next 120 days, patient will demonstrate understanding of rationale for each prescribed medication as evidenced by compliance and working with pharmacist if needed to understand medications and review of high safety medication concerns Interventions:  . Collaboration with Olin Hauser, DO regarding development and update of comprehensive plan of care as evidenced by provider attestation and co-signature . Inter-disciplinary care team collaboration (see longitudinal plan of care) . Provided written and verbal education re: Potential causes of falls and Fall prevention strategies . Reviewed medications and discussed potential side effects of medications such as dizziness and frequent urination . Assessed for s/s of orthostatic hypotension . Assessed for falls since last encounter. . Assessed patients knowledge of fall risk prevention secondary to previously provided education. . Assessed working status of life alert bracelet and patient adherence . Provided patient information for fall alert systems . Evaluation of current treatment plan related to falls and safety and patient's adherence to plan as established by provider. . Advised patient to call the office for new  falls and discuss falls with the pcp at upcoming visit  . Provided education to patient re: safety in the home and the use of DME to help with safety in the home  . Reviewed medications with patient and discussed compliance and review of high risk medications  . Discussed plans with patient for ongoing care management follow up and provided patient with direct contact information for care management team . Reviewed scheduled/upcoming provider appointments including: 02-07-2020 at 2:20 pm  Patient Goals/Self-Care Activities . Over the next 120 days, patient will:   - Utilize walker, cane, or crutches (assistive device) appropriately with all ambulation - De-clutter walkways - Change positions slowly - Wear secure fitting shoes at all times with ambulation - Utilize home lighting for dim lit areas - Demonstrate self and pet awareness at all times - plan for unforeseen emergencies (disaster) reviewed - resources needed to improve safety identified - risks to home and environmental safety identified - strategies to improve or maintain safety promoted Follow Up Plan: Telephone follow up appointment with care management team member scheduled for: 03-30-2020 at 1 pm   Task: RNCM: Identify and Reduce Risk to Safety   Note:   Care Management  Activities:    - plan for unforeseen emergencies (disaster) reviewed - resources needed to improve safety identified - risks to home and environmental safety identified - strategies to improve or maintain safety promoted         The patient verbalized understanding of instructions, educational materials, and care plan provided today and declined offer to receive copy of patient instructions, educational materials, and care plan.   Telephone follow up appointment with care management team member scheduled for: 03-30-2020 at 1 pm  Noreene Larsson RN, MSN, [place] Mobile:  [phone]

## 2020-02-07 ENCOUNTER — Encounter: Payer: Medicare Other | Admitting: Family Medicine

## 2020-02-09 ENCOUNTER — Ambulatory Visit (INDEPENDENT_AMBULATORY_CARE_PROVIDER_SITE_OTHER): Payer: Medicare Other | Admitting: Family Medicine

## 2020-02-09 ENCOUNTER — Other Ambulatory Visit: Payer: Self-pay

## 2020-02-09 ENCOUNTER — Encounter: Payer: Self-pay | Admitting: Family Medicine

## 2020-02-09 ENCOUNTER — Other Ambulatory Visit: Payer: Self-pay | Admitting: Family Medicine

## 2020-02-09 VITALS — BP 139/49 | HR 77 | Ht 63.0 in | Wt 208.0 lb

## 2020-02-09 DIAGNOSIS — I471 Supraventricular tachycardia, unspecified: Secondary | ICD-10-CM

## 2020-02-09 DIAGNOSIS — E1121 Type 2 diabetes mellitus with diabetic nephropathy: Secondary | ICD-10-CM

## 2020-02-09 DIAGNOSIS — E785 Hyperlipidemia, unspecified: Secondary | ICD-10-CM

## 2020-02-09 DIAGNOSIS — Z0000 Encounter for general adult medical examination without abnormal findings: Secondary | ICD-10-CM

## 2020-02-09 DIAGNOSIS — J432 Centrilobular emphysema: Secondary | ICD-10-CM | POA: Diagnosis not present

## 2020-02-09 DIAGNOSIS — R7989 Other specified abnormal findings of blood chemistry: Secondary | ICD-10-CM

## 2020-02-09 DIAGNOSIS — F331 Major depressive disorder, recurrent, moderate: Secondary | ICD-10-CM | POA: Diagnosis not present

## 2020-02-09 DIAGNOSIS — Z23 Encounter for immunization: Secondary | ICD-10-CM | POA: Diagnosis not present

## 2020-02-09 DIAGNOSIS — E1169 Type 2 diabetes mellitus with other specified complication: Secondary | ICD-10-CM

## 2020-02-09 NOTE — Assessment & Plan Note (Signed)
Stable chronic problem with mood, anxiety, secondary insomnia Persistent w/ life stressors family related still  Plan Encourage continue on current med Citalopram 40mg  daily Off trazodone F/u as needed

## 2020-02-09 NOTE — Assessment & Plan Note (Signed)
Well controlled DM2 w/ A1c 6.3 No hypoglycemia Complications - CKD-III stable, other including hyperlipidemia, GERD, depression, obesity - increases risk of future cardiovascular complications / poor glucose control due to reduced lifestyle diet/exercise with low energy mood and fatigue  Plan:  1. Remain diet controlled, off meds 2. Encourage improved lifestyle - low carb, low sugar diet, reduce portion size, continue improving regular exercise 3. Check CBG , bring log to next visit for review 4. Continue Statin 5. Last urine micro negative. 6. Due copy of last DM eye

## 2020-02-09 NOTE — Patient Instructions (Addendum)
Thank you for coming to the office today.  High dose Flu Shot today  You are eligible to get COVID Booster as soon as you are ready.  We will need a copy of next Diabetic Eye Exam from [facility].  DUE for FASTING BLOOD WORK (no food or drink after midnight before the lab appointment, only water or coffee without cream/sugar on the morning of)  SCHEDULE "Lab Only" visit in the morning at the clinic for lab draw in 6 MONTHS   - Make sure Lab Only appointment is at about 1 week before your next appointment, so that results will be available  For Lab Results, once available within 2-3 days of blood draw, you can can log in to MyChart online to view your results and a brief explanation. Also, we can discuss results at next follow-up visit.   Please schedule a Follow-up Appointment to: Return in about 6 months (around 08/08/2020) for 6 month fasting lab only then 1 week later 6 month Follow-up DM, Thyroid lab.  If you have any other questions or concerns, please feel free to call the office or send a message through [place]. You may also schedule an earlier appointment if necessary.  Additionally, you may be receiving a survey about your experience at our office within a few days to 1 week by e-mail or mail. We value your feedback.  Nobie Putnam, DO [place]

## 2020-02-09 NOTE — Assessment & Plan Note (Signed)
Stable, without exacerbation Former smoker On [place] Followed by Fel[facility] Beam Dr Raul Del Albuterol PRN

## 2020-02-09 NOTE — Assessment & Plan Note (Signed)
Controlled. Stable without flare up °On medication management °Followed by Cardiology °

## 2020-02-09 NOTE — Progress Notes (Signed)
Subjective:    Patient ID: Jenna [name], female    DOB: December 11, 1935, 85 y.o.   MRN: [id number]  Jenna [name] is a 85 y.o. female presenting on 02/09/2020 for Annual Exam   HPI   Here for Annual Physical and Lab Review.  CHRONIC DM, Type 2, w/ nephropathy / CKD-III Previous results A1c 6.1-6.4 Today A1c 6.3 No new concerns Meds:None Currentlynot onACEi / ARB Lifestyle - Weight down 2 lbs - Diet: Improved diet - Exercise; Limited regular exercise but working on improving Followed by [facility]- frequently for injections in eyes for diabetes, updated last exam 09/2018, has upcoming visit in April she will call to schedule sooner due to some vision distortion symptoms. Denies hypoglycemia, polyuria, visual changes, numbness or tingling.  Insomnia/ Chronic Depression, recurrent / Anxiety Reviewed last visit with background on depression. She now reports primary life stressor involves herdaughter lives withher and her husband. Causing financial stress among other issues. - Currently doing fairly well on current dose Citalopram 40mg  daily  Centrilobular Emphysema Followed by Dr Herbert Seta Pulm Controlled on Breo No new concerns.  Lower Extremity Edema Improved. New chair with able to elevate and has massager, and she can sleep in it as well, elevated legs to help reduce swelling.  Elevated TSH Lab showed TSH 6.08, no prior abnormal thyroid function or treatment. Asymptomatic.  Health Maintenance: UTD PNA Due for Flu Shot, will receive today  COVID Booster due now, she will got to pharmacy. 2nd dose 04/07/20   Depression screen [facility] 2/9 02/09/2020 08/06/2019 05/24/2019  Decreased Interest 1 1 0  Down, Depressed, Hopeless 0 0 0  PHQ - 2 Score 1 1 0  Altered sleeping 1 1 -  Tired, decreased energy 3 2 -  Change in appetite 2 0 -  Feeling bad or failure about yourself  0 0 -  Trouble concentrating 0 0 -  Moving slowly or fidgety/restless 0 0 -   Suicidal thoughts 0 0 -  PHQ-9 Score 7 4 -  Difficult doing work/chores Not difficult at all Not difficult at all -    Past Medical History:  Diagnosis Date  . Anxiety   . Arthritis    neck, knees(before replacements)  . Asthma   . CKD (chronic kidney disease), stage III ([place])   . COPD (chronic obstructive pulmonary disease) ([place])   . Encounter for colonoscopy due to history of adenomatous colonic polyps   . GERD (gastroesophageal reflux disease)   . Hearing loss of both ears   . History of echocardiogram    a. 10/2013 Echo: EF 60-65%, no rwma, midlly dil LA. Rnl RV fxn.  . Hyperlipidemia   . Osteoporosis   . Palpitations   . PSVT (paroxysmal supraventricular tachycardia) ([place])    a. 05/2018 Zio: 99 SVT runs. Fastest 218 x 4:30. Longest 4:38 w/ rate of 172.  . Sleep apnea    resolved with gastric bypass  . Syncope and collapse   . Urine incontinence    Past Surgical History:  Procedure Laterality Date  . BROW LIFT Bilateral 06/27/2015   Procedure: BLEPHAROPLASTY BILATERAL UPPER EYELIDS BILATERAL BLEPHAROTOSIS EYELIDS;  Surgeon: Karle Starch, MD;  Location: [place];  Service: Ophthalmology;  Laterality: Bilateral;  BILATERAL  . CATARACT EXTRACTION W/PHACO Right 02/13/2015   Procedure: CATARACT EXTRACTION PHACO AND INTRAOCULAR LENS PLACEMENT (IOC);  Surgeon: Ronnell Freshwater, MD;  Location: [place];  Service: Ophthalmology;  Laterality: Right;  . CATARACT EXTRACTION W/PHACO  Left 03/22/2015   Procedure: CATARACT EXTRACTION PHACO AND INTRAOCULAR LENS PLACEMENT (IOC);  Surgeon: Ronnell Freshwater, MD;  Location: [place];  Service: Ophthalmology;  Laterality: Left;  TORIC  . CHOLECYSTECTOMY  1984  . COLECTOMY    . COSMETIC SURGERY  2012   tummy tuck and excess skin removal  . GASTRIC BYPASS  2010  . REPLACEMENT TOTAL KNEE BILATERAL  1998  . SHOULDER SURGERY  2009   left   . TONSILLECTOMY    . TOTAL ABDOMINAL HYSTERECTOMY  1979    Social History   Socioeconomic History  . Marital status: Married    Spouse name: Not on file  . Number of children: Not on file  . Years of education: Teacher, English as a foreign language   . Highest education level: High school graduate  Occupational History  . Not on file  Tobacco Use  . Smoking status: Former Smoker    Packs/day: 1.00    Years: 20.00    Pack years: 20.00    Types: Cigarettes    Quit date: 07/1981    Years since quitting: 38.5  . Smokeless tobacco: Former Network engineer and Sexual Activity  . Alcohol use: Not Currently    Comment: past  . Drug use: No  . Sexual activity: Not on file  Other Topics Concern  . Not on file  Social History Narrative  . Not on file   Social Determinants of Health   Financial Resource Strain: Low Risk   . Difficulty of Paying Living Expenses: Not hard at all  Food Insecurity: No Food Insecurity  . Worried About Charity fundraiser in the Last Year: Never true  . Ran Out of Food in the Last Year: Never true  Transportation Needs: No Transportation Needs  . Lack of Transportation (Medical): No  . Lack of Transportation (Non-Medical): No  Physical Activity: Inactive  . Days of Exercise per Week: 0 days  . Minutes of Exercise per Session: 0 min  Stress: No Stress Concern Present  . Feeling of Stress : Not at all  Social Connections: Moderately Integrated  . Frequency of Communication with Friends and Family: More than three times a week  . Frequency of Social Gatherings with Friends and Family: More than three times a week  . Attends Religious Services: More than 4 times per year  . Active Member of Clubs or Organizations: No  . Attends Archivist Meetings: Never  . Marital Status: Married  Human resources officer Violence: Not At Risk  . Fear of Current or Ex-Partner: No  . Emotionally Abused: No  . Physically Abused: No  . Sexually Abused: No   Family History  Problem Relation Age of Onset  . Heart attack Mother    . Hypertension Mother   . Diabetes type II Mother   . Pneumonia Father   . Skin cancer Father   . Diabetes type II Father   . Colon cancer Sister   . Ovarian cancer Sister   . Heart attack Brother   . Heart attack Sister 53  . Hyperlipidemia Sister   . Hypertension Sister    Current Outpatient Medications on File Prior to Visit  Medication Sig  . albuterol (VENTOLIN HFA) 108 (90 Base) MCG/ACT inhaler Inhale 2 puffs into the lungs every 6 (six) hours as needed for wheezing or shortness of breath.  Marland Kitchen alendronate (FOSAMAX) 70 MG tablet Take 1 tablet (70 mg total) by mouth once a week.  . Ascorbic Acid (  VITAMIN C) 1000 MG tablet Take 500 mg by mouth daily.   Marland Kitchen BREO ELLIPTA 100-25 MCG/INH AEPB 1 puff daily.  . Cholecalciferol 25 MCG (1000 UT) tablet Take 1,000 Units by mouth daily.   . citalopram (CELEXA) 40 MG tablet Take 1 tablet (40 mg total) by mouth daily.  . clotrimazole-betamethasone (LOTRISONE) cream Apply 1-2 times a day for worsening flare dry skin dermatitis of toes/feet, may re-use daily up to 1 week as needed.  . Cyanocobalamin 1000 MCG TBCR Take 1,000 mcg by mouth daily.   . diclofenac sodium (VOLTAREN) 1 % GEL Apply 2 g topically 3 (three) times daily as needed. For hands, arthritis  . famotidine (PEPCID) 20 MG tablet Take 20 mg by mouth 2 (two) times daily.  . ferrous sulfate 325 (65 FE) MG EC tablet Take 325 mg by mouth at bedtime.   . furosemide (LASIX) 20 MG tablet Take 1 tablet (20 mg total) by mouth daily.  Marland Kitchen ipratropium (ATROVENT) 0.02 % nebulizer solution Take 0.5 mg by nebulization 4 (four) times daily.  . methocarbamol (ROBAXIN) 500 MG tablet Take 1 tablet (500 mg total) by mouth every 8 (eight) hours as needed for muscle spasms.  . metoprolol succinate (TOPROL-XL) 25 MG 24 hr tablet TAKE ONE TABLET BY MOUTH DAILY  . Multiple Vitamins-Minerals (ONE-A-DAY WOMENS PETITES) TABS Take 2 tablets by mouth every morning.  . Omega-3 Fatty Acids (FISH OIL) 1000 MG CAPS Take  1,000 mg by mouth daily.  . Pediatric Multiple Vit-C-FA (CHEWABLE VITE CHILDRENS) CHEW Chew by mouth daily. occassionally  . simvastatin (ZOCOR) 40 MG tablet Take 1 tablet (40 mg total) by mouth daily.  . traZODone (DESYREL) 50 MG tablet Take 1 tablet (50 mg total) by mouth at bedtime.   No current facility-administered medications on file prior to visit.    Review of Systems  Constitutional: Negative for activity change, appetite change, chills, diaphoresis, fatigue and fever.  HENT: Negative for congestion and hearing loss.   Eyes: Negative for visual disturbance.  Respiratory: Negative for cough, chest tightness, shortness of breath and wheezing.   Cardiovascular: Positive for leg swelling. Negative for chest pain and palpitations.  Gastrointestinal: Negative for abdominal pain, constipation, diarrhea, nausea and vomiting.  Endocrine: Negative for cold intolerance.  Genitourinary: Negative for difficulty urinating, dysuria, frequency and hematuria.  Musculoskeletal: Negative for arthralgias and neck pain.  Skin: Negative for rash.  Allergic/Immunologic: Negative for environmental allergies.  Neurological: Negative for dizziness, weakness, light-headedness, numbness and headaches.  Hematological: Negative for adenopathy.  Psychiatric/Behavioral: Negative for behavioral problems, dysphoric mood and sleep disturbance.   Per HPI unless specifically indicated above      Objective:    BP (!) 139/49   Pulse 77   Ht 5\' 3"  (1.6 m)   Wt 208 lb (94.3 kg)   SpO2 96%   BMI 36.85 kg/m   Wt Readings from Last 3 Encounters:  02/09/20 208 lb (94.3 kg)  11/25/19 210 lb 9.6 oz (95.5 kg)  08/06/19 210 lb 9.6 oz (95.5 kg)    Physical Exam Vitals and nursing note reviewed.  Constitutional:      General: She is not in acute distress.    Appearance: She is well-developed and well-nourished. She is not diaphoretic.     Comments: Well-appearing, comfortable, cooperative  HENT:     Head:  Normocephalic and atraumatic.     Mouth/Throat:     Mouth: Oropharynx is clear and moist.  Eyes:     General:  Right eye: No discharge.        Left eye: No discharge.     Extraocular Movements: EOM normal.     Conjunctiva/sclera: Conjunctivae normal.     Pupils: Pupils are equal, round, and reactive to light.  Neck:     Thyroid: No thyromegaly.  Cardiovascular:     Rate and Rhythm: Normal rate and regular rhythm.     Pulses: Intact distal pulses.     Heart sounds: Normal heart sounds. No murmur heard.   Pulmonary:     Effort: Pulmonary effort is normal. No respiratory distress.     Breath sounds: Normal breath sounds. No wheezing or rales.  Abdominal:     General: Bowel sounds are normal. There is no distension.     Palpations: Abdomen is soft. There is no mass.     Tenderness: There is no abdominal tenderness.  Musculoskeletal:        General: No tenderness. Normal range of motion.     Cervical back: Normal range of motion and neck supple.     Right lower leg: Edema (Improved) present.     Left lower leg: Edema (improved) present.     Comments: Upper / Lower Extremities: - Normal muscle tone, strength bilateral upper extremities 5/5, lower extremities 5/5  Lymphadenopathy:     Cervical: No cervical adenopathy.  Skin:    General: Skin is warm and dry.     Findings: No erythema or rash.  Neurological:     Mental Status: She is alert and oriented to person, place, and time.     Comments: Distal sensation intact to light touch all extremities  Psychiatric:        Mood and Affect: Mood and affect normal.        Behavior: Behavior normal.     Comments: Well groomed, good eye contact, normal speech and thoughts       Results for orders placed or performed in visit on 01/31/20  TSH  Result Value Ref Range   TSH 6.08 (H) 0.40 - 4.50 mIU/L  Lipid panel  Result Value Ref Range   Cholesterol 170 <200 mg/dL   HDL 80 > OR = 50 mg/dL   Triglycerides 383 <291 mg/dL    LDL Cholesterol (Calc) 67 mg/dL (calc)   Total CHOL/HDL Ratio 2.1 <5.0 (calc)   Non-HDL Cholesterol (Calc) 90 <916 mg/dL (calc)  COMPLETE METABOLIC PANEL WITH GFR  Result Value Ref Range   Glucose, Bld 146 (H) 65 - 99 mg/dL   BUN 20 7 - 25 mg/dL   Creat 6.06 (H) 0.04 - 0.88 mg/dL   GFR, Est Non African American 58 (L) > OR = 60 mL/min/1.85m2   GFR, Est African American 67 > OR = 60 mL/min/1.10m2   BUN/Creatinine Ratio 22 6 - 22 (calc)   Sodium 140 135 - 146 mmol/L   Potassium 4.2 3.5 - 5.3 mmol/L   Chloride 102 98 - 110 mmol/L   CO2 31 20 - 32 mmol/L   Calcium 9.3 8.6 - 10.4 mg/dL   Total Protein 6.1 6.1 - 8.1 g/dL   Albumin 3.9 3.6 - 5.1 g/dL   Globulin 2.2 1.9 - 3.7 g/dL (calc)   AG Ratio 1.8 1.0 - 2.5 (calc)   Total Bilirubin 0.5 0.2 - 1.2 mg/dL   Alkaline phosphatase (APISO) 63 37 - 153 U/L   AST 30 10 - 35 U/L   ALT 32 (H) 6 - 29 U/L  CBC with Differential/Platelet  Result Value  Ref Range   WBC 8.4 3.8 - 10.8 Thousand/uL   RBC 4.20 3.80 - 5.10 Million/uL   Hemoglobin 13.7 11.7 - 15.5 g/dL   HCT 40.5 35.0 - 45.0 %   MCV 96.4 80.0 - 100.0 fL   MCH 32.6 27.0 - 33.0 pg   MCHC 33.8 32.0 - 36.0 g/dL   RDW 11.7 11.0 - 15.0 %   Platelets 246 140 - 400 Thousand/uL   MPV 10.8 7.5 - 12.5 fL   Neutro Abs 4,906 1,500 - 7,800 cells/uL   Lymphs Abs 2,680 850 - 3,900 cells/uL   Absolute Monocytes 622 200 - 950 cells/uL   Eosinophils Absolute 151 15 - 500 cells/uL   Basophils Absolute 42 0 - 200 cells/uL   Neutrophils Relative % 58.4 %   Total Lymphocyte 31.9 %   Monocytes Relative 7.4 %   Eosinophils Relative 1.8 %   Basophils Relative 0.5 %  Hemoglobin A1c  Result Value Ref Range   Hgb A1c MFr Bld 6.3 (H) <5.7 % of total Hgb   Mean Plasma Glucose 134 mg/dL   eAG (mmol/L) 7.4 mmol/L      Assessment & Plan:   Problem List Items Addressed This Visit    PSVT (paroxysmal supraventricular tachycardia) ([place])    Controlled. Stable without flare up On medication  management Followed by Cardiology      Hyperlipidemia associated with type 2 diabetes mellitus ([place])    Controlled cholesterol on statin and lifestyle Last lipid panel 01/2020  Plan: 1. Continue current meds - Simvastatin 40mg  daily 2. Encourage improved lifestyle - low carb/cholesterol, reduce portion size, continue improving regular exercise       Depression, major, recurrent ([facility])    Stable chronic problem with mood, anxiety, secondary insomnia Persistent w/ life stressors family related still  Plan Encourage continue on current med Citalopram 40mg  daily Off trazodone F/u as needed      Controlled type 2 diabetes mellitus with diabetic nephropathy, without long-term current use of insulin ([facility])    Well controlled DM2 w/ A1c 6.3 No hypoglycemia Complications - CKD-III stable, other including hyperlipidemia, GERD, depression, obesity - increases risk of future cardiovascular complications / poor glucose control due to reduced lifestyle diet/exercise with low energy mood and fatigue  Plan:  1. Remain diet controlled, off meds 2. Encourage improved lifestyle - low carb, low sugar diet, reduce portion size, continue improving regular exercise 3. Check CBG , bring log to next visit for review 4. Continue Statin 5. Last urine micro negative. 6. Due copy of last DM eye      Centrilobular emphysema ([place])    Stable, without exacerbation Former smoker On Breo Followed by Feliz Beam Dr [place] Albuterol PRN       Other Visit Diagnoses    Annual physical exam    -  Primary   Needs flu shot       Relevant Orders   Flu Vaccine QUAD High Dose(Fluad) (Completed)     Updated Health Maintenance information - High dose flu shot today Reviewed recent lab results with patient Encouraged improvement to lifestyle with diet and exercise - Goal of weight loss    No orders of the defined types were placed in this encounter.    Follow up plan: Return in about 6 months  (around 08/08/2020) for 6 month fasting lab only then 1 week later 6 month Follow-up DM, Thyroid lab.   Future 6 month, repeat Thyroid panel, had mild elevated TSH, and A1c  Sheppard Coil  Parks Ranger, [facility] 02/09/2020, 2:32 PM

## 2020-02-09 NOTE — Assessment & Plan Note (Addendum)
Controlled cholesterol on statin and lifestyle Last lipid panel 01/2020  Plan: 1. Continue current meds - Simvastatin 40mg  daily 2. Encourage improved lifestyle - low carb/cholesterol, reduce portion size, continue improving regular exercise

## 2020-03-22 DIAGNOSIS — R0600 Dyspnea, unspecified: Secondary | ICD-10-CM | POA: Diagnosis not present

## 2020-03-28 ENCOUNTER — Ambulatory Visit: Payer: Medicare Other

## 2020-03-28 ENCOUNTER — Telehealth: Payer: Self-pay

## 2020-03-28 NOTE — Telephone Encounter (Signed)
This nurse called patient two times in order to perform scheduled telephonic AWV. I spoke with the office and they told me that she and her husband arrived at the office for their appointments. I was told that they cancelled their appointment since they would not make it back home in time.

## 2020-03-30 ENCOUNTER — Telehealth: Payer: Medicare Other

## 2020-03-30 ENCOUNTER — Telehealth: Payer: Self-pay | Admitting: General Practice

## 2020-03-30 NOTE — Telephone Encounter (Signed)
  Chronic Care Management   Outreach Note  [id number] Name: Keidy Thurgood MRN: [id number] DOB: 1935-06-15  Referred by: Olin Hauser, DO Reason for referral : Appointment (RNCM: Follow up call for Chronic Disease Management and Care Coordination Needs)   An unsuccessful telephone outreach was attempted today. The patient was referred to the case management team for assistance with care management and care coordination.   Follow Up Plan: A HIPAA compliant phone message was left for the patient providing contact information and requesting a return call.   Noreene Larsson RN, MSN, [place] Mobile: [phone]

## 2020-04-13 NOTE — Telephone Encounter (Signed)
Patient has been rescheduled.

## 2020-04-13 NOTE — Telephone Encounter (Signed)
Please reschedule for RN CM at [facility]

## 2020-04-17 ENCOUNTER — Other Ambulatory Visit: Payer: Self-pay

## 2020-04-17 DIAGNOSIS — E1169 Type 2 diabetes mellitus with other specified complication: Secondary | ICD-10-CM

## 2020-04-17 DIAGNOSIS — E785 Hyperlipidemia, unspecified: Secondary | ICD-10-CM

## 2020-04-17 DIAGNOSIS — F331 Major depressive disorder, recurrent, moderate: Secondary | ICD-10-CM

## 2020-04-17 DIAGNOSIS — R600 Localized edema: Secondary | ICD-10-CM

## 2020-04-17 MED ORDER — SIMVASTATIN 40 MG PO TABS: 40.0000 mg | ORAL_TABLET | Freq: Every day | ORAL | 1 refills | Status: DC

## 2020-04-17 MED ORDER — FUROSEMIDE 20 MG PO TABS: 20.0000 mg | ORAL_TABLET | Freq: Every day | ORAL | 1 refills | Status: DC

## 2020-04-17 MED ORDER — CITALOPRAM HYDROBROMIDE 40 MG PO TABS: 40.0000 mg | ORAL_TABLET | Freq: Every day | ORAL | 1 refills | Status: DC

## 2020-04-20 ENCOUNTER — Emergency Department
Admission: EM | Admit: 2020-04-20 | Discharge: 2020-04-20 | Disposition: A | Discharge status: Home / Self Care | Payer: Medicare Other | Source: Home / Self Care | Attending: Emergency Medicine | Admitting: Emergency Medicine

## 2020-04-20 ENCOUNTER — Other Ambulatory Visit: Payer: Self-pay

## 2020-04-20 ENCOUNTER — Emergency Department: Payer: Medicare Other

## 2020-04-20 DIAGNOSIS — R0602 Shortness of breath: Secondary | ICD-10-CM | POA: Diagnosis not present

## 2020-04-20 DIAGNOSIS — Z79899 Other long term (current) drug therapy: Secondary | ICD-10-CM | POA: Insufficient documentation

## 2020-04-20 DIAGNOSIS — E113212 Type 2 diabetes mellitus with mild nonproliferative diabetic retinopathy with macular edema, left eye: Secondary | ICD-10-CM | POA: Insufficient documentation

## 2020-04-20 DIAGNOSIS — E113291 Type 2 diabetes mellitus with mild nonproliferative diabetic retinopathy without macular edema, right eye: Secondary | ICD-10-CM | POA: Insufficient documentation

## 2020-04-20 DIAGNOSIS — I313 Pericardial effusion (noninflammatory): Secondary | ICD-10-CM | POA: Diagnosis not present

## 2020-04-20 DIAGNOSIS — K219 Gastro-esophageal reflux disease without esophagitis: Secondary | ICD-10-CM | POA: Diagnosis not present

## 2020-04-20 DIAGNOSIS — R069 Unspecified abnormalities of breathing: Secondary | ICD-10-CM | POA: Diagnosis not present

## 2020-04-20 DIAGNOSIS — Z743 Need for continuous supervision: Secondary | ICD-10-CM | POA: Diagnosis not present

## 2020-04-20 DIAGNOSIS — Z9884 Bariatric surgery status: Secondary | ICD-10-CM | POA: Diagnosis not present

## 2020-04-20 DIAGNOSIS — N183 Chronic kidney disease, stage 3 unspecified: Secondary | ICD-10-CM | POA: Insufficient documentation

## 2020-04-20 DIAGNOSIS — Z87891 Personal history of nicotine dependence: Secondary | ICD-10-CM | POA: Insufficient documentation

## 2020-04-20 DIAGNOSIS — D509 Iron deficiency anemia, unspecified: Secondary | ICD-10-CM | POA: Diagnosis not present

## 2020-04-20 DIAGNOSIS — Z20822 Contact with and (suspected) exposure to covid-19: Secondary | ICD-10-CM | POA: Diagnosis not present

## 2020-04-20 DIAGNOSIS — J45909 Unspecified asthma, uncomplicated: Secondary | ICD-10-CM | POA: Insufficient documentation

## 2020-04-20 DIAGNOSIS — R6889 Other general symptoms and signs: Secondary | ICD-10-CM | POA: Diagnosis not present

## 2020-04-20 DIAGNOSIS — H9193 Unspecified hearing loss, bilateral: Secondary | ICD-10-CM | POA: Diagnosis not present

## 2020-04-20 DIAGNOSIS — Z888 Allergy status to other drugs, medicaments and biological substances status: Secondary | ICD-10-CM | POA: Diagnosis not present

## 2020-04-20 DIAGNOSIS — G4700 Insomnia, unspecified: Secondary | ICD-10-CM | POA: Diagnosis not present

## 2020-04-20 DIAGNOSIS — Z885 Allergy status to narcotic agent status: Secondary | ICD-10-CM | POA: Diagnosis not present

## 2020-04-20 DIAGNOSIS — Z886 Allergy status to analgesic agent status: Secondary | ICD-10-CM | POA: Diagnosis not present

## 2020-04-20 DIAGNOSIS — R778 Other specified abnormalities of plasma proteins: Secondary | ICD-10-CM | POA: Diagnosis not present

## 2020-04-20 DIAGNOSIS — Z8249 Family history of ischemic heart disease and other diseases of the circulatory system: Secondary | ICD-10-CM | POA: Diagnosis not present

## 2020-04-20 DIAGNOSIS — Z96653 Presence of artificial knee joint, bilateral: Secondary | ICD-10-CM | POA: Insufficient documentation

## 2020-04-20 DIAGNOSIS — E785 Hyperlipidemia, unspecified: Secondary | ICD-10-CM | POA: Diagnosis not present

## 2020-04-20 DIAGNOSIS — I129 Hypertensive chronic kidney disease with stage 1 through stage 4 chronic kidney disease, or unspecified chronic kidney disease: Secondary | ICD-10-CM | POA: Diagnosis not present

## 2020-04-20 DIAGNOSIS — I471 Supraventricular tachycardia: Secondary | ICD-10-CM | POA: Diagnosis not present

## 2020-04-20 DIAGNOSIS — E1121 Type 2 diabetes mellitus with diabetic nephropathy: Secondary | ICD-10-CM | POA: Insufficient documentation

## 2020-04-20 DIAGNOSIS — R0689 Other abnormalities of breathing: Secondary | ICD-10-CM | POA: Diagnosis not present

## 2020-04-20 DIAGNOSIS — J441 Chronic obstructive pulmonary disease with (acute) exacerbation: Secondary | ICD-10-CM | POA: Insufficient documentation

## 2020-04-20 DIAGNOSIS — I248 Other forms of acute ischemic heart disease: Secondary | ICD-10-CM | POA: Diagnosis not present

## 2020-04-20 DIAGNOSIS — J9601 Acute respiratory failure with hypoxia: Secondary | ICD-10-CM | POA: Diagnosis not present

## 2020-04-20 DIAGNOSIS — E1122 Type 2 diabetes mellitus with diabetic chronic kidney disease: Secondary | ICD-10-CM | POA: Insufficient documentation

## 2020-04-20 DIAGNOSIS — J432 Centrilobular emphysema: Secondary | ICD-10-CM | POA: Diagnosis not present

## 2020-04-20 DIAGNOSIS — Z881 Allergy status to other antibiotic agents status: Secondary | ICD-10-CM | POA: Diagnosis not present

## 2020-04-20 DIAGNOSIS — R4702 Dysphasia: Secondary | ICD-10-CM | POA: Diagnosis not present

## 2020-04-20 LAB — CBC WITH DIFFERENTIAL/PLATELET
Abs Immature Granulocytes: 0.02 10*3/uL (ref 0.00–0.07)
Basophils Absolute: 0.1 10*3/uL (ref 0.0–0.1)
Basophils Relative: 1 %
Eosinophils Absolute: 0.1 10*3/uL (ref 0.0–0.5)
Eosinophils Relative: 2 %
HCT: 35.6 % — ABNORMAL LOW (ref 36.0–46.0)
Hemoglobin: 11.6 g/dL — ABNORMAL LOW (ref 12.0–15.0)
Immature Granulocytes: 0 %
Lymphocytes Relative: 25 %
Lymphs Abs: 1.9 10*3/uL (ref 0.7–4.0)
MCH: 32 pg (ref 26.0–34.0)
MCHC: 32.6 g/dL (ref 30.0–36.0)
MCV: 98.3 fL (ref 80.0–100.0)
Monocytes Absolute: 0.5 10*3/uL (ref 0.1–1.0)
Monocytes Relative: 6 %
Neutro Abs: 5.1 10*3/uL (ref 1.7–7.7)
Neutrophils Relative %: 66 %
Platelets: 190 10*3/uL (ref 150–400)
RBC: 3.62 MIL/uL — ABNORMAL LOW (ref 3.87–5.11)
RDW: 13.2 % (ref 11.5–15.5)
WBC: 7.7 10*3/uL (ref 4.0–10.5)
nRBC: 0 % (ref 0.0–0.2)

## 2020-04-20 LAB — BASIC METABOLIC PANEL
Anion gap: 11 (ref 5–15)
BUN: 17 mg/dL (ref 8–23)
CO2: 25 mmol/L (ref 22–32)
Calcium: 8.6 mg/dL — ABNORMAL LOW (ref 8.9–10.3)
Chloride: 105 mmol/L (ref 98–111)
Creatinine, Ser: 0.9 mg/dL (ref 0.44–1.00)
GFR, Estimated: >60 mL/min (ref >60)
Glucose, Bld: 159 mg/dL — ABNORMAL HIGH (ref 70–99)
Potassium: 3.5 mmol/L (ref 3.5–5.1)
Sodium: 141 mmol/L (ref 135–145)

## 2020-04-20 LAB — TROPONIN I (HIGH SENSITIVITY): Troponin I (High Sensitivity): 5 ng/L (ref <18)

## 2020-04-20 MED ORDER — IPRATROPIUM-ALBUTEROL 0.5-2.5 (3) MG/3ML IN SOLN
3.0000 mL | Freq: Once | RESPIRATORY_TRACT | Status: AC
  Administered 2020-04-20: 3 mL via RESPIRATORY_TRACT
  Filled 2020-04-20: qty 3

## 2020-04-20 MED ORDER — METHYLPREDNISOLONE SODIUM SUCC 125 MG IJ SOLR
125.0000 mg | Freq: Once | INTRAMUSCULAR | Status: AC
  Administered 2020-04-20: 125 mg via INTRAVENOUS
  Filled 2020-04-20: qty 2

## 2020-04-20 MED ORDER — PREDNISONE 20 MG PO TABS: 40.0000 mg | ORAL_TABLET | Freq: Every day | ORAL | 0 refills | Status: DC

## 2020-04-20 MED ORDER — CEFDINIR 300 MG PO CAPS: 300.0000 mg | ORAL_CAPSULE | Freq: Two times a day (BID) | ORAL | 0 refills | Status: DC

## 2020-04-20 NOTE — ED Triage Notes (Signed)
Reported via EMS for SOB since Monday. EMS reported wheezing. Duoneb given en route. Pt in NAD.

## 2020-04-20 NOTE — ED Provider Notes (Signed)
[facility] Emergency Department Provider Note   ____________________________________________   I have reviewed the triage vital signs and the nursing notes.   HISTORY  Chief Complaint Shortness of Breath   History limited by: Not Limited   HPI Jenna [name] is a 85 y.o. female who presents to the emergency department today because of concerns for shortness of breath.  Patient states that she started developing some shortness of breath 3 days ago.  This has been associated with wheezing.  She has a history of COPD and has been using her medications at home with only very minimal relief.  She has had a cough productive of some yellowish phlegm.  Patient denies any fevers.  Denies any chest pain.  She has some chronic swelling in her lower extremities but does not feel like they are more swollen than normal.  Records reviewed. Per medical record review patient has a history of COPD.  Past Medical History:  Diagnosis Date  . Anxiety   . Arthritis    neck, knees(before replacements)  . Asthma   . CKD (chronic kidney disease), stage III ([place])   . COPD (chronic obstructive pulmonary disease) ([place])   . Encounter for colonoscopy due to history of adenomatous colonic polyps   . GERD (gastroesophageal reflux disease)   . Hearing loss of both ears   . History of echocardiogram    a. 10/2013 Echo: EF 60-65%, no rwma, midlly dil LA. Rnl RV fxn.  . Hyperlipidemia   . Osteoporosis   . Palpitations   . PSVT (paroxysmal supraventricular tachycardia) ([place])    a. 05/2018 Zio: 99 SVT runs. Fastest 218 x 4:30. Longest 4:38 w/ rate of 172.  . Sleep apnea    resolved with gastric bypass  . Syncope and collapse   . Urine incontinence     Patient Active Problem List   Diagnosis Date Noted  . Age-related osteoporosis without current pathological fracture 06/03/2019  . PSVT (paroxysmal supraventricular tachycardia) ([place]) 02/01/2019  . 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 Mild nonproliferative diabetic retinopathy of right eye without macular edema ([place]) 10/13/2018  . Controlled type 2 diabetes mellitus with diabetic nephropathy, without long-term current use of insulin ([place]) 11/13/2017  . Hyperlipidemia associated with type 2 diabetes mellitus ([place]) 11/13/2017  . Depression, major, recurrent ([place]) 11/12/2017  . Psychophysiological insomnia 11/12/2017  . Primary osteoarthritis of both hands 11/12/2017  . Primary osteoarthritis involving multiple joints 11/12/2017  . Centrilobular emphysema ([place]) 11/12/2017  . Obesity (BMI 35.0-39.9 without comorbidity) 11/13/2013  . Esophageal reflux 11/13/2013  . Bilateral leg edema 11/13/2013  . Chronic cough 11/13/2013    Past Surgical History:  Procedure Laterality Date  . BROW LIFT Bilateral 06/27/2015   Procedure: BLEPHAROPLASTY BILATERAL UPPER EYELIDS BILATERAL BLEPHAROTOSIS EYELIDS;  Surgeon: Karle Starch, MD;  Location: [place];  Service: Ophthalmology;  Laterality: Bilateral;  BILATERAL  . CATARACT EXTRACTION W/PHACO Right 02/13/2015   Procedure: CATARACT EXTRACTION PHACO AND INTRAOCULAR LENS PLACEMENT (IOC);  Surgeon: Ronnell Freshwater, MD;  Location: [place];  Service: Ophthalmology;  Laterality: Right;  . CATARACT EXTRACTION W/PHACO Left 03/22/2015   Procedure: CATARACT EXTRACTION PHACO AND INTRAOCULAR LENS PLACEMENT (IOC);  Surgeon: Ronnell Freshwater, MD;  Location: [place];  Service: Ophthalmology;  Laterality: Left;  TORIC  . CHOLECYSTECTOMY  1984  . COLECTOMY    . COSMETIC SURGERY  2012   tummy tuck and excess skin removal  .  GASTRIC BYPASS  2010  . REPLACEMENT TOTAL KNEE BILATERAL  1998  . SHOULDER SURGERY  2009   left   . TONSILLECTOMY    . TOTAL ABDOMINAL HYSTERECTOMY  1979    Prior to Admission medications   Medication Sig Start Date End Date Taking? Authorizing Provider  albuterol  (VENTOLIN HFA) 108 (90 Base) MCG/ACT inhaler Inhale 2 puffs into the lungs every 6 (six) hours as needed for wheezing or shortness of breath. 06/29/18   Karamalegos, Devonne Doughty, DO  alendronate (FOSAMAX) 70 MG tablet Take 1 tablet (70 mg total) by mouth once a week. 08/04/19   Karamalegos, Devonne Doughty, DO  Ascorbic Acid (VITAMIN C) 1000 MG tablet Take 500 mg by mouth daily.     [provider]  BREO ELLIPTA 100-25 MCG/INH AEPB 1 puff daily. 12/30/18   [provider]  Cholecalciferol 25 MCG (1000 UT) tablet Take 1,000 Units by mouth daily.     [provider]  citalopram (CELEXA) 40 MG tablet Take 1 tablet (40 mg total) by mouth daily. 04/17/20   Karamalegos, Devonne Doughty, DO  clotrimazole-betamethasone (LOTRISONE) cream Apply 1-2 times a day for worsening flare dry skin dermatitis of toes/feet, may re-use daily up to 1 week as needed. 08/06/19   Karamalegos, Devonne Doughty, DO  Cyanocobalamin 1000 MCG TBCR Take 1,000 mcg by mouth daily.     [provider]  diclofenac sodium (VOLTAREN) 1 % GEL Apply 2 g topically 3 (three) times daily as needed. For hands, arthritis 11/12/17   Olin Hauser, DO  famotidine (PEPCID) 20 MG tablet Take 20 mg by mouth 2 (two) times daily.    [provider]  ferrous sulfate 325 (65 FE) MG EC tablet Take 325 mg by mouth at bedtime.     [provider]  furosemide (LASIX) 20 MG tablet Take 1 tablet (20 mg total) by mouth daily. 04/17/20   Karamalegos, Devonne Doughty, DO  ipratropium (ATROVENT) 0.02 % nebulizer solution Take 0.5 mg by nebulization 4 (four) times daily.    [provider]  methocarbamol (ROBAXIN) 500 MG tablet Take 1 tablet (500 mg total) by mouth every 8 (eight) hours as needed for muscle spasms. 06/03/19   Karamalegos, Devonne Doughty, DO  metoprolol succinate (TOPROL-XL) 25 MG 24 hr tablet TAKE ONE TABLET BY MOUTH DAILY 10/18/19   Minna Merritts, MD  Multiple Vitamins-Minerals (ONE-A-DAY WOMENS  PETITES) TABS Take 2 tablets by mouth every morning.    [provider]  Omega-3 Fatty Acids (FISH OIL) 1000 MG CAPS Take 1,000 mg by mouth daily.    [provider]  Pediatric Multiple Vit-C-FA (CHEWABLE VITE CHILDRENS) CHEW Chew by mouth daily. occassionally    [provider]  simvastatin (ZOCOR) 40 MG tablet Take 1 tablet (40 mg total) by mouth daily. 04/17/20   Karamalegos, Devonne Doughty, DO  traZODone (DESYREL) 50 MG tablet Take 1 tablet (50 mg total) by mouth at bedtime. 11/16/19   Karamalegos, Devonne Doughty, DO    Allergies Azithromycin, Hydromorphone, Morphine, Nsaids, Oxycodone-acetaminophen, Codeine, Nabumetone, Oxycodone, Oxycodone-acetaminophen, and Promethazine hcl  Family History  Problem Relation Age of Onset  . Heart attack Mother   . Hypertension Mother   . Diabetes type II Mother   . Pneumonia Father   . Skin cancer Father   . Diabetes type II Father   . Colon cancer Sister   . Ovarian cancer Sister   . Heart attack Brother   . Heart attack Sister 32  .  Hyperlipidemia Sister   . Hypertension Sister     Social History Social History   Tobacco Use  . Smoking status: Former Smoker    Packs/day: 1.00    Years: 20.00    Pack years: 20.00    Types: Cigarettes    Quit date: 07/1981    Years since quitting: 38.7  . Smokeless tobacco: Former Network engineer Use Topics  . Alcohol use: Not Currently    Comment: past  . Drug use: No    Review of Systems Constitutional: No fever/chills Eyes: No visual changes. ENT: No sore throat. Cardiovascular: Denies chest pain. Respiratory: Positive for shortness of breath and cough. Gastrointestinal: No abdominal pain.  No nausea, no vomiting.  No diarrhea.   Genitourinary: Negative for dysuria. Musculoskeletal: Negative for back pain. Skin: Negative for rash. Neurological: Negative for headaches, focal weakness or numbness.  ____________________________________________   PHYSICAL  EXAM:  VITAL SIGNS: ED Triage Vitals  Enc Vitals Group     BP 04/20/20 1303 (!) 154/56     Pulse Rate 04/20/20 1303 99     Resp --      Temp 04/20/20 1303 98.5 F (36.9 C)     Temp Source 04/20/20 1303 Oral     SpO2 04/20/20 1303 98 %     Weight 04/20/20 1304 205 lb (93 kg)     Height 04/20/20 1304 5\' 3"  (1.6 m)     Head Circumference --      Peak Flow --      Pain Score 04/20/20 1304 0   Constitutional: Alert and oriented.  Eyes: Conjunctivae are normal.  ENT      Head: Normocephalic and atraumatic.      Nose: No congestion/rhinnorhea.      Mouth/Throat: Mucous membranes are moist.      Neck: No stridor. Hematological/Lymphatic/Immunilogical: No cervical lymphadenopathy. Cardiovascular: Normal rate, regular rhythm.  No murmurs, rubs, or gallops.  Respiratory: Normal respiratory effort without tachypnea nor retractions. Breath sounds are clear and equal bilaterally. No wheezes/rales/rhonchi. Gastrointestinal: Soft and non tender. No rebound. No guarding.  Genitourinary: Deferred Musculoskeletal: Normal range of motion in all extremities. Bilateral non pitting lower extremity edema.  Neurologic:  Normal speech and language. No gross focal neurologic deficits are appreciated.  Skin:  Skin is warm, dry and intact. No rash noted. Psychiatric: Mood and affect are normal. Speech and behavior are normal. Patient exhibits appropriate insight and judgment.  ____________________________________________    LABS (pertinent positives/negatives)  CBC wbc 7.7, hgb 11.6, plt 190 Trop hs 5 BMP wnl except glu 159, ca 8.6 ____________________________________________   EKG  I, Nance Pear, attending physician, personally viewed and interpreted this EKG  EKG Time: 1347 Rate: 95 Rhythm: sinus rhythm Axis: normal Intervals: qtc 473 QRS: narrow, low voltage precordial leads ST changes: no st elevation Impression: abnormal ekg  ____________________________________________     RADIOLOGY  CXR No acute abnormality  ____________________________________________   PROCEDURES  Procedures  ____________________________________________   INITIAL IMPRESSION / ASSESSMENT AND PLAN / ED COURSE  Pertinent labs & imaging results that were available during my care of the patient were reviewed by me and considered in my medical decision making (see chart for details).   Patient presented to the emergency department today because of concerns for shortness of breath.  Patient has a history of COPD.  On exam she did have some wheezing.  Patient's chest x-ray without any concerning pneumonia.  Patient was given steroids and DuoNeb treatments here and did feel  improvement.  Will plan on discharging home with antibiotics and further steroids.   ____________________________________________   FINAL CLINICAL IMPRESSION(S) / ED DIAGNOSES  Final diagnoses:  COPD exacerbation ([place])     Note: This dictation was prepared with Dragon dictation. Any transcriptional errors that result from this process are unintentional     Nance Pear, MD 04/20/20 1558

## 2020-04-20 NOTE — Discharge Instructions (Signed)
Please seek medical attention for any high fevers, chest pain, shortness of breath, change in behavior, persistent vomiting, bloody stool or any other new or concerning symptoms.  

## 2020-04-22 ENCOUNTER — Inpatient Hospital Stay
Admission: EM | Admit: 2020-04-22 | Discharge: 2020-04-28 | DRG: 190 | Disposition: A | Discharge status: Home / Self Care | Payer: Medicare Other | Attending: Internal Medicine | Admitting: Internal Medicine

## 2020-04-22 ENCOUNTER — Encounter: Payer: Self-pay | Admitting: Emergency Medicine

## 2020-04-22 ENCOUNTER — Emergency Department: Payer: Medicare Other

## 2020-04-22 ENCOUNTER — Other Ambulatory Visit: Payer: Self-pay

## 2020-04-22 ENCOUNTER — Inpatient Hospital Stay: Payer: Medicare Other

## 2020-04-22 DIAGNOSIS — J432 Centrilobular emphysema: Secondary | ICD-10-CM | POA: Diagnosis present

## 2020-04-22 DIAGNOSIS — Z83438 Family history of other disorder of lipoprotein metabolism and other lipidemia: Secondary | ICD-10-CM

## 2020-04-22 DIAGNOSIS — E785 Hyperlipidemia, unspecified: Secondary | ICD-10-CM | POA: Diagnosis present

## 2020-04-22 DIAGNOSIS — Z886 Allergy status to analgesic agent status: Secondary | ICD-10-CM | POA: Diagnosis not present

## 2020-04-22 DIAGNOSIS — I129 Hypertensive chronic kidney disease with stage 1 through stage 4 chronic kidney disease, or unspecified chronic kidney disease: Secondary | ICD-10-CM | POA: Diagnosis present

## 2020-04-22 DIAGNOSIS — Z7952 Long term (current) use of systemic steroids: Secondary | ICD-10-CM

## 2020-04-22 DIAGNOSIS — I471 Supraventricular tachycardia, unspecified: Secondary | ICD-10-CM | POA: Diagnosis present

## 2020-04-22 DIAGNOSIS — H9193 Unspecified hearing loss, bilateral: Secondary | ICD-10-CM | POA: Diagnosis present

## 2020-04-22 DIAGNOSIS — Z881 Allergy status to other antibiotic agents status: Secondary | ICD-10-CM | POA: Diagnosis not present

## 2020-04-22 DIAGNOSIS — E1122 Type 2 diabetes mellitus with diabetic chronic kidney disease: Secondary | ICD-10-CM | POA: Diagnosis present

## 2020-04-22 DIAGNOSIS — Z8249 Family history of ischemic heart disease and other diseases of the circulatory system: Secondary | ICD-10-CM

## 2020-04-22 DIAGNOSIS — Z20822 Contact with and (suspected) exposure to covid-19: Secondary | ICD-10-CM | POA: Diagnosis present

## 2020-04-22 DIAGNOSIS — K219 Gastro-esophageal reflux disease without esophagitis: Secondary | ICD-10-CM | POA: Diagnosis present

## 2020-04-22 DIAGNOSIS — G4700 Insomnia, unspecified: Secondary | ICD-10-CM | POA: Diagnosis present

## 2020-04-22 DIAGNOSIS — I248 Other forms of acute ischemic heart disease: Secondary | ICD-10-CM | POA: Diagnosis present

## 2020-04-22 DIAGNOSIS — J80 Acute respiratory distress syndrome: Secondary | ICD-10-CM | POA: Diagnosis not present

## 2020-04-22 DIAGNOSIS — R0689 Other abnormalities of breathing: Secondary | ICD-10-CM | POA: Diagnosis not present

## 2020-04-22 DIAGNOSIS — I313 Pericardial effusion (noninflammatory): Secondary | ICD-10-CM | POA: Diagnosis present

## 2020-04-22 DIAGNOSIS — Z885 Allergy status to narcotic agent status: Secondary | ICD-10-CM | POA: Diagnosis not present

## 2020-04-22 DIAGNOSIS — J9601 Acute respiratory failure with hypoxia: Secondary | ICD-10-CM | POA: Diagnosis present

## 2020-04-22 DIAGNOSIS — R7989 Other specified abnormal findings of blood chemistry: Secondary | ICD-10-CM | POA: Diagnosis not present

## 2020-04-22 DIAGNOSIS — Z7951 Long term (current) use of inhaled steroids: Secondary | ICD-10-CM

## 2020-04-22 DIAGNOSIS — Z9884 Bariatric surgery status: Secondary | ICD-10-CM

## 2020-04-22 DIAGNOSIS — D509 Iron deficiency anemia, unspecified: Secondary | ICD-10-CM | POA: Diagnosis present

## 2020-04-22 DIAGNOSIS — R231 Pallor: Secondary | ICD-10-CM | POA: Diagnosis not present

## 2020-04-22 DIAGNOSIS — E1121 Type 2 diabetes mellitus with diabetic nephropathy: Secondary | ICD-10-CM

## 2020-04-22 DIAGNOSIS — R778 Other specified abnormalities of plasma proteins: Secondary | ICD-10-CM | POA: Diagnosis present

## 2020-04-22 DIAGNOSIS — Z87891 Personal history of nicotine dependence: Secondary | ICD-10-CM

## 2020-04-22 DIAGNOSIS — R6889 Other general symptoms and signs: Secondary | ICD-10-CM | POA: Diagnosis not present

## 2020-04-22 DIAGNOSIS — E1165 Type 2 diabetes mellitus with hyperglycemia: Secondary | ICD-10-CM | POA: Diagnosis present

## 2020-04-22 DIAGNOSIS — F339 Major depressive disorder, recurrent, unspecified: Secondary | ICD-10-CM | POA: Diagnosis present

## 2020-04-22 DIAGNOSIS — E669 Obesity, unspecified: Secondary | ICD-10-CM | POA: Diagnosis present

## 2020-04-22 DIAGNOSIS — J441 Chronic obstructive pulmonary disease with (acute) exacerbation: Secondary | ICD-10-CM | POA: Diagnosis present

## 2020-04-22 DIAGNOSIS — Z833 Family history of diabetes mellitus: Secondary | ICD-10-CM

## 2020-04-22 DIAGNOSIS — R0602 Shortness of breath: Secondary | ICD-10-CM | POA: Diagnosis not present

## 2020-04-22 DIAGNOSIS — N183 Chronic kidney disease, stage 3 unspecified: Secondary | ICD-10-CM | POA: Diagnosis present

## 2020-04-22 DIAGNOSIS — E119 Type 2 diabetes mellitus without complications: Secondary | ICD-10-CM | POA: Diagnosis present

## 2020-04-22 DIAGNOSIS — F331 Major depressive disorder, recurrent, moderate: Secondary | ICD-10-CM

## 2020-04-22 DIAGNOSIS — Z888 Allergy status to other drugs, medicaments and biological substances status: Secondary | ICD-10-CM

## 2020-04-22 DIAGNOSIS — Z9049 Acquired absence of other specified parts of digestive tract: Secondary | ICD-10-CM | POA: Diagnosis not present

## 2020-04-22 DIAGNOSIS — I6522 Occlusion and stenosis of left carotid artery: Secondary | ICD-10-CM | POA: Diagnosis not present

## 2020-04-22 DIAGNOSIS — Z6836 Body mass index (BMI) 36.0-36.9, adult: Secondary | ICD-10-CM

## 2020-04-22 DIAGNOSIS — R490 Dysphonia: Secondary | ICD-10-CM | POA: Diagnosis not present

## 2020-04-22 DIAGNOSIS — Z79899 Other long term (current) drug therapy: Secondary | ICD-10-CM

## 2020-04-22 DIAGNOSIS — K449 Diaphragmatic hernia without obstruction or gangrene: Secondary | ICD-10-CM | POA: Diagnosis not present

## 2020-04-22 DIAGNOSIS — Z743 Need for continuous supervision: Secondary | ICD-10-CM | POA: Diagnosis not present

## 2020-04-22 LAB — CBC WITH DIFFERENTIAL/PLATELET
Abs Immature Granulocytes: 0.04 10*3/uL (ref 0.00–0.07)
Basophils Absolute: 0.1 10*3/uL (ref 0.0–0.1)
Basophils Relative: 1 %
Eosinophils Absolute: 0.1 10*3/uL (ref 0.0–0.5)
Eosinophils Relative: 1 %
HCT: 36.9 % (ref 36.0–46.0)
Hemoglobin: 12.2 g/dL (ref 12.0–15.0)
Immature Granulocytes: 0 %
Lymphocytes Relative: 39 %
Lymphs Abs: 5.2 10*3/uL — ABNORMAL HIGH (ref 0.7–4.0)
MCH: 32.8 pg (ref 26.0–34.0)
MCHC: 33.1 g/dL (ref 30.0–36.0)
MCV: 99.2 fL (ref 80.0–100.0)
Monocytes Absolute: 0.8 10*3/uL (ref 0.1–1.0)
Monocytes Relative: 6 %
Neutro Abs: 7 10*3/uL (ref 1.7–7.7)
Neutrophils Relative %: 53 %
Platelets: 245 10*3/uL (ref 150–400)
RBC: 3.72 MIL/uL — ABNORMAL LOW (ref 3.87–5.11)
RDW: 13.3 % (ref 11.5–15.5)
Smear Review: NORMAL
WBC: 13.2 10*3/uL — ABNORMAL HIGH (ref 4.0–10.5)
nRBC: 0 % (ref 0.0–0.2)

## 2020-04-22 LAB — BLOOD GAS, VENOUS
Acid-Base Excess: 2 mmol/L (ref 0.0–2.0)
Bicarbonate: 27.8 mmol/L (ref 20.0–28.0)
O2 Saturation: 88.6 %
Patient temperature: 37
pCO2, Ven: 47 mmHg (ref 44.0–60.0)
pH, Ven: 7.38 (ref 7.250–7.430)
pO2, Ven: 57 mmHg — ABNORMAL HIGH (ref 32.0–45.0)

## 2020-04-22 LAB — TROPONIN I (HIGH SENSITIVITY)
Troponin I (High Sensitivity): 41 ng/L — ABNORMAL HIGH (ref <18)
Troponin I (High Sensitivity): 39 ng/L — ABNORMAL HIGH (ref <18)
Troponin I (High Sensitivity): 37 ng/L — ABNORMAL HIGH (ref <18)

## 2020-04-22 LAB — COMPREHENSIVE METABOLIC PANEL
ALT: 44 U/L (ref 0–44)
AST: 51 U/L — ABNORMAL HIGH (ref 15–41)
Albumin: 3.7 g/dL (ref 3.5–5.0)
Alkaline Phosphatase: 52 U/L (ref 38–126)
Anion gap: 10 (ref 5–15)
BUN: 23 mg/dL (ref 8–23)
CO2: 23 mmol/L (ref 22–32)
Calcium: 8.9 mg/dL (ref 8.9–10.3)
Chloride: 104 mmol/L (ref 98–111)
Creatinine, Ser: 1.06 mg/dL — ABNORMAL HIGH (ref 0.44–1.00)
GFR, Estimated: 52 mL/min — ABNORMAL LOW (ref >60)
Glucose, Bld: 160 mg/dL — ABNORMAL HIGH (ref 70–99)
Potassium: 4.9 mmol/L (ref 3.5–5.1)
Sodium: 137 mmol/L (ref 135–145)
Total Bilirubin: 1.1 mg/dL (ref 0.3–1.2)
Total Protein: 6.4 g/dL — ABNORMAL LOW (ref 6.5–8.1)

## 2020-04-22 LAB — RESP PANEL BY RT-PCR (FLU A&B, COVID) ARPGX2
Influenza A by PCR: NEGATIVE
Influenza B by PCR: NEGATIVE
SARS Coronavirus 2 by RT PCR: NEGATIVE

## 2020-04-22 LAB — LACTIC ACID, PLASMA: Lactic Acid, Venous: 1.7 mmol/L (ref 0.5–1.9)

## 2020-04-22 LAB — BRAIN NATRIURETIC PEPTIDE: B Natriuretic Peptide: 138.4 pg/mL — ABNORMAL HIGH (ref 0.0–100.0)

## 2020-04-22 LAB — PROTIME-INR
INR: 1 (ref 0.8–1.2)
Prothrombin Time: 12.3 seconds (ref 11.4–15.2)

## 2020-04-22 LAB — D-DIMER, QUANTITATIVE: D-Dimer, Quant: 0.54 ug/mL-FEU — ABNORMAL HIGH (ref 0.00–0.50)

## 2020-04-22 LAB — PROCALCITONIN: Procalcitonin: <0.1 ng/mL

## 2020-04-22 LAB — GLUCOSE, CAPILLARY: Glucose-Capillary: 330 mg/dL — ABNORMAL HIGH (ref 70–99)

## 2020-04-22 MED ORDER — FERROUS SULFATE 325 (65 FE) MG PO TABS
325.0000 mg | ORAL_TABLET | Freq: Every day | ORAL | Status: DC
  Administered 2020-04-23 – 2020-04-27 (×5): 325 mg via ORAL
  Filled 2020-04-22 (×5): qty 1

## 2020-04-22 MED ORDER — DOXYCYCLINE HYCLATE 100 MG PO TABS
100.0000 mg | ORAL_TABLET | Freq: Two times a day (BID) | ORAL | Status: AC
  Administered 2020-04-22 – 2020-04-27 (×10): 100 mg via ORAL
  Filled 2020-04-22 (×10): qty 1

## 2020-04-22 MED ORDER — OMEGA-3-ACID ETHYL ESTERS 1 G PO CAPS
1.0000 g | ORAL_CAPSULE | Freq: Every day | ORAL | Status: DC
  Administered 2020-04-23 – 2020-04-28 (×5): 1 g via ORAL
  Filled 2020-04-22 (×6): qty 1

## 2020-04-22 MED ORDER — ASCORBIC ACID 500 MG PO TABS
500.0000 mg | ORAL_TABLET | Freq: Every day | ORAL | Status: DC
  Administered 2020-04-23 – 2020-04-28 (×6): 500 mg via ORAL
  Filled 2020-04-22 (×6): qty 1

## 2020-04-22 MED ORDER — INSULIN ASPART 100 UNIT/ML ~~LOC~~ SOLN
0.0000 [IU] | Freq: Every day | SUBCUTANEOUS | Status: DC
  Administered 2020-04-22: 4 [IU] via SUBCUTANEOUS
  Administered 2020-04-23: 3 [IU] via SUBCUTANEOUS
  Administered 2020-04-26 – 2020-04-27 (×2): 2 [IU] via SUBCUTANEOUS
  Filled 2020-04-22 (×4): qty 1

## 2020-04-22 MED ORDER — INSULIN ASPART 100 UNIT/ML ~~LOC~~ SOLN: 0.0000 [IU] | Freq: Three times a day (TID) | SUBCUTANEOUS | Status: DC

## 2020-04-22 MED ORDER — FUROSEMIDE 20 MG PO TABS
20.0000 mg | ORAL_TABLET | Freq: Every day | ORAL | Status: DC
  Administered 2020-04-22 – 2020-04-27 (×5): 20 mg via ORAL
  Filled 2020-04-22 (×5): qty 1

## 2020-04-22 MED ORDER — ADULT MULTIVITAMIN W/MINERALS CH
1.0000 | ORAL_TABLET | Freq: Every day | ORAL | Status: DC
  Administered 2020-04-23 – 2020-04-27 (×5): 1 via ORAL
  Filled 2020-04-22 (×5): qty 1

## 2020-04-22 MED ORDER — MAGNESIUM SULFATE IN D5W 1-5 GM/100ML-% IV SOLN
1.0000 g | Freq: Once | INTRAVENOUS | Status: AC
  Administered 2020-04-22: 1 g via INTRAVENOUS
  Filled 2020-04-22: qty 100

## 2020-04-22 MED ORDER — ENOXAPARIN SODIUM 60 MG/0.6ML ~~LOC~~ SOLN
50.0000 mg | SUBCUTANEOUS | Status: DC
  Administered 2020-04-22 – 2020-04-27 (×6): 50 mg via SUBCUTANEOUS
  Filled 2020-04-22 (×6): qty 0.6

## 2020-04-22 MED ORDER — IPRATROPIUM-ALBUTEROL 0.5-2.5 (3) MG/3ML IN SOLN
3.0000 mL | Freq: Four times a day (QID) | RESPIRATORY_TRACT | Status: DC
  Administered 2020-04-22 – 2020-04-25 (×11): 3 mL via RESPIRATORY_TRACT
  Filled 2020-04-22 (×12): qty 3

## 2020-04-22 MED ORDER — IPRATROPIUM-ALBUTEROL 0.5-2.5 (3) MG/3ML IN SOLN
3.0000 mL | Freq: Once | RESPIRATORY_TRACT | Status: AC
  Administered 2020-04-22: 3 mL via RESPIRATORY_TRACT
  Filled 2020-04-22: qty 3

## 2020-04-22 MED ORDER — SIMVASTATIN 40 MG PO TABS
40.0000 mg | ORAL_TABLET | Freq: Every day | ORAL | Status: DC
  Administered 2020-04-23 – 2020-04-27 (×4): 40 mg via ORAL
  Filled 2020-04-22 (×6): qty 1

## 2020-04-22 MED ORDER — VITAMIN D 25 MCG (1000 UNIT) PO TABS
1000.0000 [IU] | ORAL_TABLET | Freq: Every day | ORAL | Status: DC
  Administered 2020-04-23 – 2020-04-28 (×6): 1000 [IU] via ORAL
  Filled 2020-04-22 (×7): qty 1

## 2020-04-22 MED ORDER — VITAMIN B-12 1000 MCG PO TABS
1000.0000 ug | ORAL_TABLET | Freq: Every day | ORAL | Status: DC
  Administered 2020-04-23 – 2020-04-28 (×6): 1000 ug via ORAL
  Filled 2020-04-22 (×6): qty 1

## 2020-04-22 MED ORDER — IOHEXOL 350 MG/ML SOLN
75.0000 mL | Freq: Once | INTRAVENOUS | Status: AC | PRN
  Administered 2020-04-22: 75 mL via INTRAVENOUS

## 2020-04-22 MED ORDER — METHYLPREDNISOLONE SODIUM SUCC 125 MG IJ SOLR
125.0000 mg | Freq: Four times a day (QID) | INTRAMUSCULAR | Status: AC
  Administered 2020-04-22 – 2020-04-23 (×4): 125 mg via INTRAVENOUS
  Filled 2020-04-22 (×4): qty 2

## 2020-04-22 MED ORDER — METOPROLOL SUCCINATE ER 25 MG PO TB24
25.0000 mg | ORAL_TABLET | Freq: Every day | ORAL | Status: DC
  Administered 2020-04-22 – 2020-04-27 (×5): 25 mg via ORAL
  Filled 2020-04-22 (×6): qty 1

## 2020-04-22 MED ORDER — FAMOTIDINE 20 MG PO TABS
20.0000 mg | ORAL_TABLET | Freq: Every day | ORAL | Status: DC
  Administered 2020-04-22 – 2020-04-27 (×6): 20 mg via ORAL
  Filled 2020-04-22 (×6): qty 1

## 2020-04-22 MED ORDER — SODIUM CHLORIDE 0.9% FLUSH
3.0000 mL | Freq: Two times a day (BID) | INTRAVENOUS | Status: DC
  Administered 2020-04-22 – 2020-04-27 (×11): 3 mL via INTRAVENOUS

## 2020-04-22 MED ORDER — PREDNISONE 20 MG PO TABS
40.0000 mg | ORAL_TABLET | Freq: Every day | ORAL | Status: DC
  Administered 2020-04-24: 40 mg via ORAL
  Filled 2020-04-22: qty 2

## 2020-04-22 MED ORDER — TRAZODONE HCL 50 MG PO TABS
50.0000 mg | ORAL_TABLET | Freq: Every day | ORAL | Status: DC
  Administered 2020-04-22 – 2020-04-27 (×6): 50 mg via ORAL
  Filled 2020-04-22 (×6): qty 1

## 2020-04-22 MED ORDER — CITALOPRAM HYDROBROMIDE 20 MG PO TABS
40.0000 mg | ORAL_TABLET | Freq: Every day | ORAL | Status: DC
  Administered 2020-04-23 – 2020-04-28 (×6): 40 mg via ORAL
  Filled 2020-04-22 (×6): qty 2

## 2020-04-22 MED ORDER — INSULIN ASPART 100 UNIT/ML ~~LOC~~ SOLN
0.0000 [IU] | Freq: Three times a day (TID) | SUBCUTANEOUS | Status: DC
  Administered 2020-04-23: 8 [IU] via SUBCUTANEOUS
  Administered 2020-04-23: 5 [IU] via SUBCUTANEOUS
  Administered 2020-04-23: 3 [IU] via SUBCUTANEOUS
  Administered 2020-04-24: 5 [IU] via SUBCUTANEOUS
  Administered 2020-04-24: 2 [IU] via SUBCUTANEOUS
  Administered 2020-04-24: 5 [IU] via SUBCUTANEOUS
  Administered 2020-04-25: 3 [IU] via SUBCUTANEOUS
  Administered 2020-04-25 – 2020-04-26 (×2): 8 [IU] via SUBCUTANEOUS
  Administered 2020-04-26: 3 [IU] via SUBCUTANEOUS
  Administered 2020-04-27 – 2020-04-28 (×3): 5 [IU] via SUBCUTANEOUS
  Filled 2020-04-22 (×14): qty 1

## 2020-04-22 NOTE — ED Notes (Signed)
Pt unable to sign EMTALA waiver due to respiratory status

## 2020-04-22 NOTE — ED Notes (Signed)
Informed RN bed assigned [id number]

## 2020-04-22 NOTE — ED Triage Notes (Signed)
Pt to ED via [facility] from home for Respiratory distress. Per EMS pt was just discharged from the ED on Thursday for the same. Pt woke up yesterday and was a little worse and then today called EMS. EMS reports that upon their arrival pt had absent breath sounds in both lower lobes, and rhonchi and rales in the upper lobes. Pt was given 3 Duo Neb treatments, 1 albuterol treatment, Epi 0.5 mg IM, and Solumedrol 125 mh. Pt arrives alert with good color, on CPAP.

## 2020-04-22 NOTE — ED Notes (Signed)
Called and spoke with [facility], informed her order had been placed for IV team consult at 1237 because patient needed second line and she is a hard stick, pt also scheduled for CTA and we are not able to get large enough IV, This RN informed Malachy Mood that IV has yet to come place second IV. Malachy Mood will contact IV team and have them come start second line ASAP.

## 2020-04-22 NOTE — Plan of Care (Signed)

## 2020-04-22 NOTE — ED Provider Notes (Signed)
[facility] Emergency Department Provider Note   ____________________________________________   Event Date/Time   First MD Initiated Contact with Patient 04/22/20 1236     (approximate)  I have reviewed the triage vital signs and the nursing notes.   HISTORY  Chief Complaint Respiratory Distress  EM caveat: Respiratory distress on BiPAP difficult to communicate with BiPAP intervention in place  HPI Jenna [name] is a 85 y.o. female reports shortness of breath.  Was seen at the ER a couple days ago started on treatment for COPD but symptoms got worse.  History of similar in the past.  No leg swelling.  Not having any pain or discomfort anywhere including no chest pain.  Shortness of breath starting to get better with EMS treatments  EMS reports giving multiple nebs, intramuscular epinephrine, placing patient on CPAP   Past Medical History:  Diagnosis Date  . Anxiety   . Arthritis    neck, knees(before replacements)  . Asthma   . CKD (chronic kidney disease), stage III ([place])   . COPD (chronic obstructive pulmonary disease) ([place])   . Encounter for colonoscopy due to history of adenomatous colonic polyps   . GERD (gastroesophageal reflux disease)   . Hearing loss of both ears   . History of echocardiogram    a. 10/2013 Echo: EF 60-65%, no rwma, midlly dil LA. Rnl RV fxn.  . Hyperlipidemia   . Osteoporosis   . Palpitations   . PSVT (paroxysmal supraventricular tachycardia) ([place])    a. 05/2018 Zio: 99 SVT runs. Fastest 218 x 4:30. Longest 4:38 w/ rate of 172.  . Sleep apnea    resolved with gastric bypass  . Syncope and collapse   . Urine incontinence     Patient Active Problem List   Diagnosis Date Noted  . Gastric bypass status for obesity 04/22/2020  . COPD exacerbation ([place]) 04/22/2020  . Age-related osteoporosis without current pathological fracture 06/03/2019  . PSVT (paroxysmal supraventricular tachycardia) ([place]) 02/01/2019  . Mild  nonproliferative diabetic retinopathy of left eye with macular edema ([place]) 10/13/2018  . Mild nonproliferative diabetic retinopathy of right eye without macular edema ([place]) 10/13/2018  . Controlled type 2 diabetes mellitus with diabetic nephropathy, without long-term current use of insulin ([place]) 11/13/2017  . Hyperlipidemia associated with type 2 diabetes mellitus ([place]) 11/13/2017  . Depression, major, recurrent ([place]) 11/12/2017  . Psychophysiological insomnia 11/12/2017  . Primary osteoarthritis of both hands 11/12/2017  . Primary osteoarthritis involving multiple joints 11/12/2017  . Centrilobular emphysema ([place]) 11/12/2017  . Obesity (BMI 35.0-39.9 without comorbidity) 11/13/2013  . Esophageal reflux 11/13/2013  . Bilateral leg edema 11/13/2013  . Chronic cough 11/13/2013  . Hx of adenomatous colonic polyps 10/15/2013    Past Surgical History:  Procedure Laterality Date  . BROW LIFT Bilateral 06/27/2015   Procedure: BLEPHAROPLASTY BILATERAL UPPER EYELIDS BILATERAL BLEPHAROTOSIS EYELIDS;  Surgeon: Karle Starch, MD;  Location: [place];  Service: Ophthalmology;  Laterality: Bilateral;  BILATERAL  . CATARACT EXTRACTION W/PHACO Right 02/13/2015   Procedure: CATARACT EXTRACTION PHACO AND INTRAOCULAR LENS PLACEMENT (IOC);  Surgeon: Ronnell Freshwater, MD;  Location: [place];  Service: Ophthalmology;  Laterality: Right;  . CATARACT EXTRACTION W/PHACO Left 03/22/2015   Procedure: CATARACT EXTRACTION PHACO AND INTRAOCULAR LENS PLACEMENT (IOC);  Surgeon: Ronnell Freshwater, MD;  Location: [place];  Service: Ophthalmology;  Laterality: Left;  TORIC  . CHOLECYSTECTOMY  1984  . COLECTOMY    . COSMETIC SURGERY  2012  tummy tuck and excess skin removal  . GASTRIC BYPASS  2010  . REPLACEMENT TOTAL KNEE BILATERAL  1998  . SHOULDER SURGERY  2009   left   . TONSILLECTOMY    . TOTAL ABDOMINAL HYSTERECTOMY  1979    Prior to Admission medications    Medication Sig Start Date End Date Taking? Authorizing Provider  albuterol (VENTOLIN HFA) 108 (90 Base) MCG/ACT inhaler Inhale 2 puffs into the lungs every 6 (six) hours as needed for wheezing or shortness of breath. 06/29/18  Yes Karamalegos, Devonne Doughty, DO  Ascorbic Acid (VITAMIN C) 1000 MG tablet Take 500 mg by mouth daily.    Yes [provider]  BREO ELLIPTA 100-25 MCG/INH AEPB 1 puff daily. 12/30/18  Yes [provider]  cefdinir (OMNICEF) 300 MG capsule Take 1 capsule (300 mg total) by mouth 2 (two) times daily for 5 days. 04/20/20 04/25/20 Yes Nance Pear, MD  Cholecalciferol 25 MCG (1000 UT) tablet Take 1,000 Units by mouth daily.    Yes [provider]  citalopram (CELEXA) 40 MG tablet Take 1 tablet (40 mg total) by mouth daily. 04/17/20  Yes Karamalegos, Devonne Doughty, DO  clotrimazole-betamethasone (LOTRISONE) cream Apply 1-2 times a day for worsening flare dry skin dermatitis of toes/feet, may re-use daily up to 1 week as needed. 08/06/19  Yes Karamalegos, Devonne Doughty, DO  Cyanocobalamin 1000 MCG TBCR Take 1,000 mcg by mouth daily.    Yes [provider]  diclofenac sodium (VOLTAREN) 1 % GEL Apply 2 g topically 3 (three) times daily as needed. For hands, arthritis 11/12/17  Yes Karamalegos, Devonne Doughty, DO  famotidine (PEPCID) 20 MG tablet Take 20 mg by mouth 2 (two) times daily.   Yes [provider]  ferrous sulfate 325 (65 FE) MG EC tablet Take 325 mg by mouth at bedtime.    Yes [provider]  furosemide (LASIX) 20 MG tablet Take 1 tablet (20 mg total) by mouth daily. 04/17/20  Yes Karamalegos, Alexander J, DO  ipratropium (ATROVENT) 0.02 % nebulizer solution Take 0.5 mg by nebulization 4 (four) times daily.   Yes [provider]  metoprolol succinate (TOPROL-XL) 25 MG 24 hr tablet TAKE ONE TABLET BY MOUTH DAILY 10/18/19  Yes Gollan, Kathlene November, MD  Multiple Vitamins-Minerals (ONE-A-DAY WOMENS PETITES) TABS Take 2 tablets by  mouth every morning.   Yes [provider]  Omega-3 Fatty Acids (FISH OIL) 1000 MG CAPS Take 1,000 mg by mouth daily.   Yes [provider]  simvastatin (ZOCOR) 40 MG tablet Take 1 tablet (40 mg total) by mouth daily. 04/17/20  Yes Karamalegos, Devonne Doughty, DO  traZODone (DESYREL) 50 MG tablet Take 1 tablet (50 mg total) by mouth at bedtime. 11/16/19  Yes Karamalegos, Devonne Doughty, DO  alendronate (FOSAMAX) 70 MG tablet Take 1 tablet (70 mg total) by mouth once a week. 08/04/19   Karamalegos, Devonne Doughty, DO  methocarbamol (ROBAXIN) 500 MG tablet Take 1 tablet (500 mg total) by mouth every 8 (eight) hours as needed for muscle spasms. 06/03/19   Karamalegos, Devonne Doughty, DO    Allergies Azithromycin, Hydromorphone, Morphine, Nsaids, Oxycodone-acetaminophen, Codeine, Nabumetone, Oxycodone, Oxycodone-acetaminophen, and Promethazine hcl  Family History  Problem Relation Age of Onset  . Heart attack Mother   . Hypertension Mother   . Diabetes type II Mother   . Pneumonia Father   . Skin cancer Father   . Diabetes type II Father   . Colon cancer Sister   . Ovarian cancer  Sister   . Heart attack Brother   . Heart attack Sister 41  . Hyperlipidemia Sister   . Hypertension Sister     Social History Social History   Tobacco Use  . Smoking status: Former Smoker    Packs/day: 1.00    Years: 20.00    Pack years: 20.00    Types: Cigarettes    Quit date: 07/1981    Years since quitting: 38.7  . Smokeless tobacco: Former Network engineer Use Topics  . Alcohol use: Not Currently    Comment: past  . Drug use: No    Review of Systems  EM caveat  ____________________________________________   PHYSICAL EXAM:  VITAL SIGNS: ED Triage Vitals  Enc Vitals Group     BP 04/22/20 1230 (!) 139/92     Pulse Rate 04/22/20 1230 89     Resp 04/22/20 1237 (!) 22     Temp 04/22/20 1230 98.4 F (36.9 C)     Temp Source 04/22/20 1230 Oral     SpO2 04/22/20 1225 100 %     Weight  04/22/20 1231 205 lb (93 kg)     Height 04/22/20 1231 5\' 3"  (1.6 m)     Head Circumference --      Peak Flow --      Pain Score 04/22/20 1230 0     Pain Loc --      Pain Edu? --      Excl. in [place]? --     Constitutional: Alert and oriented.  Able to speak over BiPAP somewhat.  In moderate distress with increased work of breathing, moderate wheezes on expiratory Eyes: Conjunctivae are normal. Head: Atraumatic. Nose: No congestion/rhinnorhea. Mouth/Throat: Mucous membranes are moist. Neck: No stridor.  Cardiovascular: Normal rate, regular rhythm. Grossly normal heart sounds.  Good peripheral circulation. Respiratory: Mild tachypnea.  Mild accessory muscle use.  Tolerating BiPAP very well, sitting up quite comfortably.  Able to speak over the BiPAP to some extent No rales. Experiatory wheezing prominent throughout. No tripoding. Gastrointestinal: Soft and nontender. No distention. Musculoskeletal: No lower extremity tenderness nor edema. Neurologic:  Normal speech and language. No gross focal neurologic deficits are appreciated.  Skin:  Skin is warm, dry and intact. No rash noted. Psychiatric: Mood and affect are normal. Speech and behavior are normal.  ____________________________________________   LABS (all labs ordered are listed, but only abnormal results are displayed)  Labs Reviewed  CBC WITH DIFFERENTIAL/PLATELET - Abnormal; Notable for the following components:      Result Value   WBC 13.2 (*)    RBC 3.72 (*)    Lymphs Abs 5.2 (*)    All other components within normal limits  COMPREHENSIVE METABOLIC PANEL - Abnormal; Notable for the following components:   Glucose, Bld 160 (*)    Creatinine, Ser 1.06 (*)    Total Protein 6.4 (*)    AST 51 (*)    GFR, Estimated 52 (*)    All other components within normal limits  BRAIN NATRIURETIC PEPTIDE - Abnormal; Notable for the following components:   B Natriuretic Peptide 138.4 (*)    All other components within normal limits   D-DIMER, QUANTITATIVE - Abnormal; Notable for the following components:   D-Dimer, Quant 0.54 (*)    All other components within normal limits  BLOOD GAS, VENOUS - Abnormal; Notable for the following components:   pO2, Ven 57.0 (*)    All other components within normal limits  HEMOGLOBIN A1C - Abnormal; Notable for  the following components:   Hgb A1c MFr Bld 6.5 (*)    All other components within normal limits  GLUCOSE, CAPILLARY - Abnormal; Notable for the following components:   Glucose-Capillary 330 (*)    All other components within normal limits  GLUCOSE, CAPILLARY - Abnormal; Notable for the following components:   Glucose-Capillary 235 (*)    All other components within normal limits  GLUCOSE, CAPILLARY - Abnormal; Notable for the following components:   Glucose-Capillary 263 (*)    All other components within normal limits  GLUCOSE, CAPILLARY - Abnormal; Notable for the following components:   Glucose-Capillary 163 (*)    All other components within normal limits  GLUCOSE, CAPILLARY - Abnormal; Notable for the following components:   Glucose-Capillary 266 (*)    All other components within normal limits  GLUCOSE, CAPILLARY - Abnormal; Notable for the following components:   Glucose-Capillary 246 (*)    All other components within normal limits  TROPONIN I (HIGH SENSITIVITY) - Abnormal; Notable for the following components:   Troponin I (High Sensitivity) 41 (*)    All other components within normal limits  TROPONIN I (HIGH SENSITIVITY) - Abnormal; Notable for the following components:   Troponin I (High Sensitivity) 39 (*)    All other components within normal limits  TROPONIN I (HIGH SENSITIVITY) - Abnormal; Notable for the following components:   Troponin I (High Sensitivity) 37 (*)    All other components within normal limits  CULTURE, BLOOD (ROUTINE X 2)  CULTURE, BLOOD (ROUTINE X 2)  RESP PANEL BY RT-PCR (FLU A&B, COVID) ARPGX2  EXPECTORATED SPUTUM ASSESSMENT W  GRAM STAIN, RFLX TO RESP C  RESPIRATORY PANEL BY PCR  EXPECTORATED SPUTUM ASSESSMENT W GRAM STAIN, RFLX TO RESP C  LACTIC ACID, PLASMA  PROTIME-INR  PROCALCITONIN  CBC   ____________________________________________  EKG  Reviewed and interpreted by me at 1232 Heart rate 90 QRS 95 QTc 450 Normal sinus rhythm, slight T wave abnormality most notable in the lateral precordial leads.  No obvious ischemia of note patient denies chest pain ____________________________________________  RADIOLOGY  DG Chest Portable 1 View  Result Date: 04/22/2020 CLINICAL DATA:  Shortness of breath. EXAM: PORTABLE CHEST 1 VIEW COMPARISON:  April 20, 2020 FINDINGS: The cardiomediastinal silhouette is stable. No pneumothorax. No nodules or masses. No focal infiltrates. No overt edema. IMPRESSION: No active disease. Electronically Signed   By: Dorise Bullion III M.D   On: 04/22/2020 13:01    Chest x-ray reviewed negative for acute finding. ____________________________________________   PROCEDURES  Procedure(s) performed: None  Procedures  Critical Care performed: Yes, see critical care note(s)  CRITICAL CARE Performed by: Delman Kitten   Total critical care time: 30 minutes  Critical care time was exclusive of separately billable procedures and treating other patients.  Critical care was necessary to treat or prevent imminent or life-threatening deterioration.  Critical care was time spent personally by me on the following activities: development of treatment plan with patient and/or surrogate as well as nursing, discussions with consultants, evaluation of patient's response to treatment, examination of patient, obtaining history from patient or surrogate, ordering and performing treatments and interventions, ordering and review of laboratory studies, ordering and review of radiographic studies, pulse oximetry and re-evaluation of patient's  condition.  ____________________________________________   INITIAL IMPRESSION / ASSESSMENT AND PLAN / ED COURSE  Pertinent labs & imaging results that were available during my care of the patient were reviewed by me and considered in my medical decision making (see  chart for details).   Patient presents after being treated outpatient for possible COPD, presenting now with respiratory distress on CPAP with EMS.  Transitioned well to BiPAP.  Solu-Medrol, nebs, intramuscular epinephrine all initiated prior to arrival.  We will continue with additional breathing treatments,and will obviously need admission.  The patient is resting fairly well on BiPAP and tolerating procedure well.  In addition given her failed outpatient treatment with worsening shortness of breath would entertain the possibility of pulmonary embolism.  Will admit to the hospitalist service, discussed with Dr. Rhunette Croft stat who is understanding that CT angiogram is pending.  No evidence of acute ACS.  No chest pain.  Chest x-ray is not convincingly that of any type of pneumonia or obvious infectious etiology at this point.  Patient is responding very well to treatment  Clinical Course as of 04/24/20 1118  Sat Apr 22, 2020  1551 Dr. Dione Plover in ED, planning to ED.  [MQ]    Clinical Course User Index [MQ] Delman Kitten, MD    Patient was able to be weaned off BiPAP to nasal cannula while in the ER.  Showing rapid improvement.  Ongoing care assigned to Dr. Quentin Cornwall to follow-up on CT angiogram pending admission ____________________________________________   FINAL CLINICAL IMPRESSION(S) / ED DIAGNOSES  Final diagnoses:  COPD exacerbation ([place])        Note:  This document was prepared using Dragon voice recognition software and may include unintentional dictation errors       Delman Kitten, MD 04/24/20 1119

## 2020-04-22 NOTE — ED Notes (Signed)
Dr. Jacqualine Code informed we are waiting on IV team to come and place larger IV for CTA.

## 2020-04-22 NOTE — ED Notes (Signed)
Pts BP cuff not on at this time due to IV team attempting to get IV.

## 2020-04-22 NOTE — ED Notes (Signed)
Admitting MD at bedside.

## 2020-04-22 NOTE — ED Notes (Signed)
Zach, EDT to take pt to the floor

## 2020-04-22 NOTE — H&P (Signed)
[facility] History and Physical  Lauramae Kneisley Machi WCB:[id number] DOB: [birth year] DOA: 04/22/2020  Referring physician: Dr. Jacqualine Code PCP: Olin Hauser, DO   Chief Complaint: Shortness of breath  HPI: Stephaine Breshears is a 85 y.o. female with history of obesity, depression, COPD, type 2 diabetes, remote gastric bypass, who presents with shortness of breath.  Patient seen in the emergency department 2 days prior for shortness of breath.  Presentation felt to be most consistent with COPD exacerbation, she was given steroids and DuoNeb treatments with improvement and discharged home with antibiotics and steroids.  Today patient reports she woke this morning out of sleep with sudden significant increase in her shortness of breath.  She had improved somewhat since leaving the ED last but this morning could barely speak she was so short of breath.  EMS was called and she was placed on CPAP, given some epi on the way into the ED, and then upon arrival placed on BiPAP.  She reports she has been taking steroids, inhaler, and antibiotics as prescribed since discharge.  She does endorse chronic swelling in her left compared to her right leg has been present for many years and is unchanged recently.  She denies any chest pain during any of this time.  In addition to shortness of breath she reports an increase in the quantity but not the quality of her sputum.  In the ED initial vital signs notable for intermittent mild tachypnea in the 20s and mild hypertension.  Labs notable for CBC with white count of 13.2, CMP with borderline elevated creatinine at 1.06 and AST at 51, BNP elevated at 138, and initial troponin elevated but downtrending from 41 to 39.  D-dimer also faintly positive at 0.54, Covid test was negative, chest x-ray showed no active disease.  Procalcitonin was negative.  Blood cultures were obtained.  An EKG was obtained which showed no significant change from prior.  A CTPA was ordered  and is currently pending.  She was treated with duo nebs and IV magnesium, with significant improvement in her symptoms and was able to be weaned from BiPAP.  She was admitted for further management of presumed COPD exacerbation.  Review of Systems:  Pertinent positives and negative per HPI, all others reviewed and negative  Past Medical History:  Diagnosis Date  . Anxiety   . Arthritis    neck, knees(before replacements)  . Asthma   . CKD (chronic kidney disease), stage III ([place])   . COPD (chronic obstructive pulmonary disease) ([place])   . Encounter for colonoscopy due to history of adenomatous colonic polyps   . GERD (gastroesophageal reflux disease)   . Hearing loss of both ears   . History of echocardiogram    a. 10/2013 Echo: EF 60-65%, no rwma, midlly dil LA. Rnl RV fxn.  . Hyperlipidemia   . Osteoporosis   . Palpitations   . PSVT (paroxysmal supraventricular tachycardia) ([place])    a. 05/2018 [facility]: 99 SVT runs. Fastest 218 x 4:30. Longest 4:38 w/ rate of 172.  . Sleep apnea    resolved with gastric bypass  . Syncope and collapse   . Urine incontinence    Past Surgical History:  Procedure Laterality Date  . BROW LIFT Bilateral 06/27/2015   Procedure: BLEPHAROPLASTY BILATERAL UPPER EYELIDS BILATERAL BLEPHAROTOSIS EYELIDS;  Surgeon: Karle Starch, MD;  Location: [facility];  Service: Ophthalmology;  Laterality: Bilateral;  BILATERAL  . CATARACT EXTRACTION W/PHACO Right 02/13/2015   Procedure: CATARACT EXTRACTION PHACO AND  INTRAOCULAR LENS PLACEMENT (IOC);  Surgeon: Ronnell Freshwater, MD;  Location: [facility];  Service: Ophthalmology;  Laterality: Right;  . CATARACT EXTRACTION W/PHACO Left 03/22/2015   Procedure: CATARACT EXTRACTION PHACO AND INTRAOCULAR LENS PLACEMENT (IOC);  Surgeon: Ronnell Freshwater, MD;  Location: [facility];  Service: Ophthalmology;  Laterality: Left;  TORIC  . CHOLECYSTECTOMY  1984  . COLECTOMY    . COSMETIC SURGERY   2012   tummy tuck and excess skin removal  . GASTRIC BYPASS  2010  . REPLACEMENT TOTAL KNEE BILATERAL  1998  . SHOULDER SURGERY  2009   left   . TONSILLECTOMY    . TOTAL ABDOMINAL HYSTERECTOMY  1979   Social History:  reports that she quit smoking about 38 years ago. Her smoking use included cigarettes. She has a 20.00 pack-year smoking history. She has quit using smokeless tobacco. She reports previous alcohol use. She reports that she does not use drugs.  Allergies  Allergen Reactions  . Azithromycin Other (See Comments)    Causes stomach burning pains  . Hydromorphone Other (See Comments)    confusion, personality change  . Morphine Other (See Comments)    Goes crazy  . Nsaids Other (See Comments)    Avoids because of gastric bypass surgery  . Oxycodone-Acetaminophen Nausea And Vomiting  . Codeine Other (See Comments) and Nausea And Vomiting    "will not stay down"  . Nabumetone Rash  . Oxycodone Nausea And Vomiting  . Oxycodone-Acetaminophen Nausea And Vomiting  . Promethazine Hcl Rash and Other (See Comments)    Family History  Problem Relation Age of Onset  . Heart attack Mother   . Hypertension Mother   . Diabetes type II Mother   . Pneumonia Father   . Skin cancer Father   . Diabetes type II Father   . Colon cancer Sister   . Ovarian cancer Sister   . Heart attack Brother   . Heart attack Sister 49  . Hyperlipidemia Sister   . Hypertension Sister      Prior to Admission medications   Medication Sig Start Date End Date Taking? Authorizing Provider  albuterol (VENTOLIN HFA) 108 (90 Base) MCG/ACT inhaler Inhale 2 puffs into the lungs every 6 (six) hours as needed for wheezing or shortness of breath. 06/29/18  Yes Karamalegos, Devonne Doughty, DO  Ascorbic Acid (VITAMIN C) 1000 MG tablet Take 500 mg by mouth daily.    Yes [provider]  BREO ELLIPTA 100-25 MCG/INH AEPB 1 puff daily. 12/30/18  Yes [provider]  cefdinir (OMNICEF) 300 MG capsule  Take 1 capsule (300 mg total) by mouth 2 (two) times daily for 5 days. 04/20/20 04/25/20 Yes Nance Pear, MD  Cholecalciferol 25 MCG (1000 UT) tablet Take 1,000 Units by mouth daily.    Yes [provider]  citalopram (CELEXA) 40 MG tablet Take 1 tablet (40 mg total) by mouth daily. 04/17/20  Yes Karamalegos, Devonne Doughty, DO  clotrimazole-betamethasone (LOTRISONE) cream Apply 1-2 times a day for worsening flare dry skin dermatitis of toes/feet, may re-use daily up to 1 week as needed. 08/06/19  Yes Karamalegos, Devonne Doughty, DO  Cyanocobalamin 1000 MCG TBCR Take 1,000 mcg by mouth daily.    Yes [provider]  diclofenac sodium (VOLTAREN) 1 % GEL Apply 2 g topically 3 (three) times daily as needed. For hands, arthritis 11/12/17  Yes Karamalegos, Devonne Doughty, DO  famotidine (PEPCID) 20 MG tablet Take 20 mg by mouth 2 (two) times  daily.   Yes [provider]  ferrous sulfate 325 (65 FE) MG EC tablet Take 325 mg by mouth at bedtime.    Yes [provider]  furosemide (LASIX) 20 MG tablet Take 1 tablet (20 mg total) by mouth daily. 04/17/20  Yes Karamalegos, Alexander J, DO  ipratropium (ATROVENT) 0.02 % nebulizer solution Take 0.5 mg by nebulization 4 (four) times daily.   Yes [provider]  metoprolol succinate (TOPROL-XL) 25 MG 24 hr tablet TAKE ONE TABLET BY MOUTH DAILY 10/18/19  Yes Gollan, Kathlene November, MD  Multiple Vitamins-Minerals (ONE-A-DAY WOMENS PETITES) TABS Take 2 tablets by mouth every morning.   Yes [provider]  Omega-3 Fatty Acids (FISH OIL) 1000 MG CAPS Take 1,000 mg by mouth daily.   Yes [provider]  predniSONE (DELTASONE) 20 MG tablet Take 2 tablets (40 mg total) by mouth daily with breakfast for 3 days. 04/20/20 04/23/20 Yes Nance Pear, MD  simvastatin (ZOCOR) 40 MG tablet Take 1 tablet (40 mg total) by mouth daily. 04/17/20  Yes Karamalegos, Devonne Doughty, DO  traZODone (DESYREL) 50 MG tablet Take 1 tablet (50 mg  total) by mouth at bedtime. 11/16/19  Yes Karamalegos, Devonne Doughty, DO  alendronate (FOSAMAX) 70 MG tablet Take 1 tablet (70 mg total) by mouth once a week. 08/04/19   Karamalegos, Devonne Doughty, DO  methocarbamol (ROBAXIN) 500 MG tablet Take 1 tablet (500 mg total) by mouth every 8 (eight) hours as needed for muscle spasms. 06/03/19   Olin Hauser, DO   Physical Exam: Vitals:   04/22/20 1515 04/22/20 1530 04/22/20 1545 04/22/20 1600  BP: 135/60 (!) 139/54    Pulse: 86 85 89 91  Resp: 19 17 (!) 23 20  Temp:      TempSrc:      SpO2: 96% 98% 95% 95%  Weight:      Height:        Wt Readings from Last 3 Encounters:  04/22/20 93 kg  04/20/20 93 kg  02/09/20 94.3 kg     . General:  Appears calm and comfortable . Eyes: PERRL, normal lids, irises & conjunctiva . ENT: grossly normal hearing, lips & tongue . Neck: no masses . Cardiovascular: RRR, no m/r/g. LLE with non-pitting edema significantly larger than RLE. RLE with 1+ pitting edema to mid shin.  . Telemetry: SR, no arrhythmias  . Respiratory: Mildly increased work of breathing.  Diffuse coarse rhonchi with inspiration and expiration.  Decent air movement throughout.  No crackles. . Abdomen: soft, ntnd . Skin: no rash or induration seen on limited exam . Musculoskeletal: grossly normal tone BUE/BLE . Psychiatric: grossly normal mood and affect, speech fluent and appropriate . Neurologic: grossly non-focal.   Bedside ultrasound of chest (my interpretation): Short parasternal axis view of the heart demonstrates subjectively normal cardiac function.  Ultrasound of the lung fields with no B-lines present.          Labs on Admission:  Basic Metabolic Panel: Recent Labs  Lab 04/20/20 1352 04/22/20 1241  NA 141 137  K 3.5 4.9  CL 105 104  CO2 25 23  GLUCOSE 159* 160*  BUN 17 23  CREATININE 0.90 1.06*  CALCIUM 8.6* 8.9   Liver Function Tests: Recent Labs  Lab 04/22/20 1241  AST 51*  ALT 44  ALKPHOS 52   BILITOT 1.1  PROT 6.4*  ALBUMIN 3.7   No results for input(s): LIPASE, AMYLASE in the last 168 hours. No results for input(s): AMMONIA  in the last 168 hours. CBC: Recent Labs  Lab 04/20/20 1352 04/22/20 1241  WBC 7.7 13.2*  NEUTROABS 5.1 7.0  HGB 11.6* 12.2  HCT 35.6* 36.9  MCV 98.3 99.2  PLT 190 245   Cardiac Enzymes: No results for input(s): CKTOTAL, CKMB, CKMBINDEX, TROPONINI in the last 168 hours.  BNP (last 3 results) Recent Labs    04/22/20 1306  BNP 138.4*    ProBNP (last 3 results) No results for input(s): PROBNP in the last 8760 hours.  CBG: No results for input(s): GLUCAP in the last 168 hours.  Radiological Exams on Admission: DG Chest Portable 1 View  Result Date: 04/22/2020 CLINICAL DATA:  Shortness of breath. EXAM: PORTABLE CHEST 1 VIEW COMPARISON:  April 20, 2020 FINDINGS: The cardiomediastinal silhouette is stable. No pneumothorax. No nodules or masses. No focal infiltrates. No overt edema. IMPRESSION: No active disease. Electronically Signed   By: Dorise Bullion III M.D   On: 04/22/2020 13:01    EKG: Independently reviewed.  Sinus rhythm with close to left axis deviation.  Some signs of LVH with ST slurring in 1 and aVL.  Some flattening of T waves in lateral precordial leads.  Compared to prior from 2 days ago no significant changes.  Assessment/Plan Active Problems:   Obesity (BMI 35.0-39.9 without comorbidity)   Depression, major, recurrent (HCC)   Centrilobular emphysema (HCC)   Controlled type 2 diabetes mellitus with diabetic nephropathy, without long-term current use of insulin (HCC)   PSVT (paroxysmal supraventricular tachycardia) (HCC)   Gastric bypass status for obesity   COPD exacerbation ([place])   Shaquel Josephson is a 85 y.o. female with history of obesity, depression, COPD, type 2 diabetes, remote gastric bypass, who presents with shortness of breath and found to be in likely COPD exacerbation.  #COPD exacerbation Patient  presenting 2 days after ED visit with diagnosis for COPD exacerbation with worsening symptoms.  Completely agree with the ED provider's decision to order CTPA to rule out PE, results are currently pending.  BNP is mildly elevated and troponin is also slightly elevated though not downtrending raising possibility of cardiac etiology.  Bedside ultrasound demonstrates subjectively normal cardiac function and no B-lines in the lungs, patient has no history but will obtain a TTE to evaluate further and trend troponin. -Methylprednisone 125 mg IV followed by prednisone p.o. 40 mg x 4 days -Hold home inhaled steroids -Scheduled DuoNebs every 6 hours -Doxycycline 100 mg twice daily -Follow-up results of CTPA -Trend troponin -Follow-up results of transthoracic echocardiogram -Continue supplemental oxygen, titrate to O2 saturation greater than 90%  #Elevated troponin Likely demand in the setting of mild hypoxia and increased work of breathing, patient denies any chest pain. -Follow-up TTE -Trend troponin  #Known medical problems Depression-continue citalopram GERD-continue famotidine Anemia-continue iron Lower extremity edema-continue furosemide Hypertension-continue metoprolol Hyperlipidemia-continue simvastatin Insomnia-continue trazodone  Code Status: Full code, confirmed DVT Prophylaxis: Lovenox Family Communication: Daughter Neoma Laming updated by telephone Disposition Plan: Inpatient, MedSurg  Time spent: 50 min  Clarnce Flock MD/[facility]  Note:  This document was prepared using Systems analyst and may include unintentional dictation errors.

## 2020-04-22 NOTE — ED Notes (Signed)
IV team at bedside 

## 2020-04-22 NOTE — ED Notes (Signed)
Pt taken off bi-pap for trial period. Pt currently on 2 Liters of O2 via [place], will monitor pts SpO2 levels to see how pt tolerates being off bipap.  Pt husband at bedside

## 2020-04-23 ENCOUNTER — Inpatient Hospital Stay: Admit: 2020-04-23 | Payer: Medicare Other

## 2020-04-23 DIAGNOSIS — J441 Chronic obstructive pulmonary disease with (acute) exacerbation: Secondary | ICD-10-CM | POA: Diagnosis not present

## 2020-04-23 DIAGNOSIS — E669 Obesity, unspecified: Secondary | ICD-10-CM | POA: Diagnosis not present

## 2020-04-23 DIAGNOSIS — E1121 Type 2 diabetes mellitus with diabetic nephropathy: Secondary | ICD-10-CM | POA: Diagnosis not present

## 2020-04-23 LAB — GLUCOSE, CAPILLARY
Glucose-Capillary: 235 mg/dL — ABNORMAL HIGH (ref 70–99)
Glucose-Capillary: 263 mg/dL — ABNORMAL HIGH (ref 70–99)
Glucose-Capillary: 163 mg/dL — ABNORMAL HIGH (ref 70–99)
Glucose-Capillary: 266 mg/dL — ABNORMAL HIGH (ref 70–99)

## 2020-04-23 LAB — EXPECTORATED SPUTUM ASSESSMENT W GRAM STAIN, RFLX TO RESP C

## 2020-04-23 LAB — HEMOGLOBIN A1C
Hgb A1c MFr Bld: 6.5 % — ABNORMAL HIGH (ref 4.8–5.6)
Mean Plasma Glucose: 139.85 mg/dL

## 2020-04-23 MED ORDER — GUAIFENESIN-DM 100-10 MG/5ML PO SYRP
5.0000 mL | ORAL_SOLUTION | ORAL | Status: DC | PRN
  Administered 2020-04-23 – 2020-04-24 (×5): 5 mL via ORAL
  Filled 2020-04-23 (×5): qty 5

## 2020-04-23 MED ORDER — IPRATROPIUM-ALBUTEROL 0.5-2.5 (3) MG/3ML IN SOLN
3.0000 mL | Freq: Once | RESPIRATORY_TRACT | Status: DC
  Filled 2020-04-23 (×2): qty 3

## 2020-04-23 MED ORDER — ALBUTEROL SULFATE (2.5 MG/3ML) 0.083% IN NEBU
2.5000 mg | INHALATION_SOLUTION | RESPIRATORY_TRACT | Status: DC | PRN
  Administered 2020-04-23 – 2020-04-27 (×3): 2.5 mg via RESPIRATORY_TRACT
  Filled 2020-04-23 (×3): qty 3

## 2020-04-23 NOTE — Plan of Care (Signed)
  Problem: Clinical Measurements: Goal: Ability to maintain clinical measurements within normal limits will improve Outcome: Progressing Goal: Will remain free from infection Outcome: Progressing Goal: Diagnostic test results will improve Outcome: Progressing Goal: Respiratory complications will improve Outcome: Progressing Goal: Cardiovascular complication will be avoided Outcome: Progressing   Problem: Activity: Goal: Risk for activity intolerance will decrease Outcome: Progressing   Problem: Pain Managment: Goal: General experience of comfort will improve Outcome: Progressing   Pt is involved in and agrees with the plan of care. V/S stable. Pt coughing and wheezing; neb tx given. Dyspnea on exertion noted. On oxygen at 3lpm/ with oxygen saturation @ 97%.

## 2020-04-23 NOTE — Progress Notes (Signed)
PROGRESS NOTE    Jenna [name]  LOV:[id number] DOB: 1935-12-24 DOA: 04/22/2020 PCP: Olin Hauser, DO   Brief Narrative: Jenna [name] is a 85 y.o. female with a history of COPD, diabetes, gastric bypass, obesity. Patient presented secondary to shortness of breath and found to have a COPD exacerbation. Steroids and Duonebs started.   Assessment & Plan:   Active Problems:   Obesity (BMI 35.0-39.9 without comorbidity)   Depression, major, recurrent (HCC)   Centrilobular emphysema (HCC)   Controlled type 2 diabetes mellitus with diabetic nephropathy, without long-term current use of insulin (HCC)   PSVT (paroxysmal supraventricular tachycardia) (HCC)   Gastric bypass status for obesity   COPD exacerbation (HCC)   COPD exacerbation History of exacerbation in the past. Unsure of etiology of this exacerbation. She does have seasonal allergies. Mostly upper airway wheezing on exam, though which may be related to GERD or vocal cord issue. No recent surgeries. -Discontinue Solu-medrol and start Prednisone -Continue Doxycycline -Continue Duonebs  Acute respiratory failure with hypoxia Secondary to above -Wean to room air as able  Elevated troponin No chest pain. Mildly elevated and downtrending. Transthoracic Echocardiogram ordered and pending. -Follow-up Transthoracic Echocardiogram  Depression -Continue citalopram  GERD -Continue famotidine  Diabetes mellitus, type 2 Patient is not on medications as an outpatient. Last hemoglobin A1C of 6.5 which is well controlled. -Diet control recommended  History of anemia Not currently anemic. On iron supplementation -Continue iron supplementation  Primary hypertension -Continue metoprolol  Hyperlipidemia -Continue simvastatin  Insomnia -Continue trazodone  Obesity Body mass index is 36.31 kg/m.   DVT prophylaxis: Lovenox Code Status:   Code Status: Full Code Family Communication: None at  bedside Disposition Plan: Discharge home likely in 2-3 days pending improvement of dyspnea, return to functional baseline, PT/OT recommendations   Consultants:   None  Procedures:   None  Antimicrobials:  Doxycycline    Subjective: Dyspnea is improved but not at baseline.  Objective: Vitals:   04/22/20 2316 04/23/20 0455 04/23/20 0752 04/23/20 1141  BP: (!) 110/48 (!) 124/54 138/85 (!) 119/56  Pulse: 64 67 68 64  Resp: 20 18 18 18   Temp: 97.6 F (36.4 C) (!) 97.5 F (36.4 C) (!) 97.5 F (36.4 C) 97.8 F (36.6 C)  TempSrc: Oral Oral Oral Oral  SpO2: 100% 97% 100% 99%  Weight:      Height:        Intake/Output Summary (Last 24 hours) at 04/23/2020 1411 Last data filed at 04/23/2020 0910 Gross per 24 hour  Intake 3 ml  Output 1000 ml  Net -997 ml   Filed Weights   04/22/20 1231  Weight: 93 kg    Examination:  General exam: Appears calm and comfortable Respiratory system: Significant wheezing over larynx with transmitted airway sounds that improved with pursed lip breathing. Respiratory effort normal. Cardiovascular system: S1 & S2 heard, RRR. No murmurs, rubs, gallops or clicks. Gastrointestinal system: Abdomen is nondistended, soft and nontender. No organomegaly or masses felt. Normal bowel sounds heard. Central nervous system: Alert and oriented. No focal neurological deficits. Musculoskeletal: No edema. No calf tenderness Skin: No cyanosis. No rashes Psychiatry: Judgement and insight appear normal. Mood & affect appropriate.     Data Reviewed: I have personally reviewed following labs and imaging studies  CBC Lab Results  Component Value Date   WBC 13.2 (H) 04/22/2020   RBC 3.72 (L) 04/22/2020   HGB 12.2 04/22/2020   HCT 36.9 04/22/2020   MCV 99.2 04/22/2020  MCH 32.8 04/22/2020   PLT 245 04/22/2020   MCHC 33.1 04/22/2020   RDW 13.3 04/22/2020   LYMPHSABS 5.2 (H) 04/22/2020   MONOABS 0.8 04/22/2020   EOSABS 0.1 04/22/2020   BASOSABS 0.1  99/83/3825     Last metabolic panel Lab Results  Component Value Date   NA 137 04/22/2020   K 4.9 04/22/2020   CL 104 04/22/2020   CO2 23 04/22/2020   BUN 23 04/22/2020   CREATININE 1.06 (H) 04/22/2020   GLUCOSE 160 (H) 04/22/2020   GFRNONAA 52 (L) 04/22/2020   GFRAA 67 01/31/2020   CALCIUM 8.9 04/22/2020   PHOS 2.8 06/16/2016   PROT 6.4 (L) 04/22/2020   ALBUMIN 3.7 04/22/2020   BILITOT 1.1 04/22/2020   ALKPHOS 52 04/22/2020   AST 51 (H) 04/22/2020   ALT 44 04/22/2020   ANIONGAP 10 04/22/2020    CBG (last 3)  Recent Labs    04/22/20 2053 04/23/20 0734 04/23/20 1137  GLUCAP 330* 235* 263*     GFR: Estimated Creatinine Clearance: 44.3 mL/min (A) (by C-G formula based on SCr of 1.06 mg/dL (H)).  Coagulation Profile: Recent Labs  Lab 04/22/20 1241  INR 1.0    Recent Results (from the past 240 hour(s))  Blood culture (routine x 2)     Status: None (Preliminary result)   Collection Time: 04/22/20 12:41 PM   Specimen: BLOOD  Result Value Ref Range Status   Specimen Description BLOOD  RIGHT WRIST  Final   Special Requests   Final    BOTTLES DRAWN AEROBIC AND ANAEROBIC Blood Culture results may not be optimal due to an inadequate volume of blood received in culture bottles   Culture   Final    NO GROWTH < 24 HOURS Performed at [facility], [address], [place], [place] [address]    Report Status PENDING  Incomplete  Resp Panel by RT-PCR (Flu A&B, Covid) Nasopharyngeal Swab     Status: None   Collection Time: 04/22/20 12:47 PM   Specimen: Nasopharyngeal Swab; Nasopharyngeal(NP) swabs in vial transport medium  Result Value Ref Range Status   SARS Coronavirus 2 by RT PCR NEGATIVE NEGATIVE Final    Comment: (NOTE) SARS-CoV-2 target nucleic acids are NOT DETECTED.  The SARS-CoV-2 RNA is generally detectable in upper respiratory specimens during the acute phase of infection. The lowest concentration of SARS-CoV-2 viral copies this assay can detect  is 138 copies/mL. A negative result does not preclude SARS-Cov-2 infection and should not be used as the sole basis for treatment or other patient management decisions. A negative result may occur with  improper specimen collection/handling, submission of specimen other than nasopharyngeal swab, presence of viral mutation(s) within the areas targeted by this assay, and inadequate number of viral copies(<138 copies/mL). A negative result must be combined with clinical observations, patient history, and epidemiological information. The expected result is Negative.  Fact Sheet for Patients:  EntrepreneurPulse.com.au  Fact Sheet for Healthcare Providers:  IncredibleEmployment.be  This test is no t yet approved or cleared by the [place] FDA and  has been authorized for detection and/or diagnosis of SARS-CoV-2 by FDA under an Emergency Use Authorization (EUA). This EUA will remain  in effect (meaning this test can be used) for the duration of the COVID-19 declaration under Section 564(b)(1) of the Act, 21 U.S.C.section 360bbb-3(b)(1), unless the authorization is terminated  or revoked sooner.       Influenza A by PCR NEGATIVE NEGATIVE Final   Influenza B by  PCR NEGATIVE NEGATIVE Final    Comment: (NOTE) The Xpert Xpress SARS-CoV-2/FLU/RSV plus assay is intended as an aid in the diagnosis of influenza from Nasopharyngeal swab specimens and should not be used as a sole basis for treatment. Nasal washings and aspirates are unacceptable for Xpert Xpress SARS-CoV-2/FLU/RSV testing.  Fact Sheet for Patients: EntrepreneurPulse.com.au  Fact Sheet for Healthcare Providers: IncredibleEmployment.be  This test is not yet approved or cleared by the [place] FDA and has been authorized for detection and/or diagnosis of SARS-CoV-2 by FDA under an Emergency Use Authorization (EUA). This EUA will remain in effect  (meaning this test can be used) for the duration of the COVID-19 declaration under Section 564(b)(1) of the Act, 21 U.S.C. section 360bbb-3(b)(1), unless the authorization is terminated or revoked.  Performed at [facility], [address] [place], [place] [address]   Blood culture (routine x 2)     Status: None (Preliminary result)   Collection Time: 04/22/20  1:06 PM   Specimen: BLOOD  Result Value Ref Range Status   Specimen Description BLOOD  RIGHT ARM  Final   Special Requests   Final    BOTTLES DRAWN AEROBIC AND ANAEROBIC Blood Culture adequate volume   Culture   Final    NO GROWTH < 24 HOURS Performed at [facility], [address] [place], [place] [address]    Report Status PENDING  Incomplete  Culture, sputum-assessment     Status: None   Collection Time: 04/22/20  9:48 PM   Specimen: Sputum  Result Value Ref Range Status   Specimen Description SPUTUM  Final   Special Requests NONE  Final   Sputum evaluation   Final    Sputum specimen not acceptable for testing.  Please recollect.   C/MARY ANN RIBAY PAGDATOON AT 3614 04/23/20.PMF Performed at [facility], [address] [place], [place] [address]    Report Status 04/23/2020 FINAL  Final        Radiology Studies: CT Angio Chest PE W and/or Wo Contrast  Result Date: 04/22/2020 CLINICAL DATA:  Respiratory distress. Pulmonary embolus suspected. Absent breath sounds. EXAM: CT ANGIOGRAPHY CHEST WITH CONTRAST TECHNIQUE: Multidetector CT imaging of the chest was performed using the standard protocol during bolus administration of intravenous contrast. Multiplanar CT image reconstructions and MIPs were obtained to evaluate the vascular anatomy. CONTRAST:  29mL OMNIPAQUE IOHEXOL 350 MG/ML SOLN COMPARISON:  CT angio chest 06/17/2016 FINDINGS: Cardiovascular: Satisfactory opacification of the pulmonary arteries to the segmental level. No evidence of pulmonary embolism. Normal heart size. No  significant pericardial effusion. The thoracic aorta is normal in caliber. Mild atherosclerotic plaque of the thoracic aorta. Mild left anterior descending coronary artery calcifications. Mediastinum/Nodes: No enlarged mediastinal, hilar, or axillary lymph nodes. Thyroid gland, trachea, and esophagus demonstrate no significant findings. Tiny hiatal hernia. Lungs/Pleura: Pulmonary micronodule within the right upper lobe (7:43). Left lower lobe calcified pulmonary micronodule (7:72). Several other scattered pulmonary micronodules. No pulmonary mass. No focal consolidation. No pleural effusion. No pneumothorax. Upper Abdomen: Gastric surgical changes noted. Status post cholecystectomy. Atherosclerotic plaque. Musculoskeletal: No chest wall abnormality. No suspicious lytic or blastic osseous lesions. No acute displaced fracture. Review of the MIP images confirms the above findings. IMPRESSION: 1. No pulmonary embolus. 2. No acute intrathoracic abnormality. 3. Tiny hiatal hernia in a patient status post gastric surgery. Electronically Signed   By: Iven Finn M.D.   On: 04/22/2020 18:27   DG Chest Portable 1 View  Result Date: 04/22/2020 CLINICAL DATA:  Shortness of breath. EXAM: PORTABLE CHEST 1 VIEW COMPARISON:  April 20, 2020 FINDINGS: The cardiomediastinal silhouette is stable. No pneumothorax. No nodules or masses. No focal infiltrates. No overt edema. IMPRESSION: No active disease. Electronically Signed   By: Dorise Bullion III M.D   On: 04/22/2020 13:01        Scheduled Meds: . vitamin C  500 mg Oral Daily  . cholecalciferol  1,000 Units Oral Daily  . citalopram  40 mg Oral Daily  . doxycycline  100 mg Oral Q12H  . enoxaparin (LOVENOX) injection  50 mg Subcutaneous Q24H  . famotidine  20 mg Oral QHS  . ferrous sulfate  325 mg Oral QHS  . furosemide  20 mg Oral Daily  . insulin aspart  0-15 Units Subcutaneous TID WC  . insulin aspart  0-5 Units Subcutaneous QHS  . ipratropium-albuterol  3  mL Nebulization Q6H  . ipratropium-albuterol  3 mL Nebulization Once  . methylPREDNISolone (SOLU-MEDROL) injection  125 mg Intravenous Q6H   Followed by  . [START ON 04/24/2020] predniSONE  40 mg Oral Q breakfast  . metoprolol succinate  25 mg Oral Daily  . multivitamin with minerals  1 tablet Oral Q lunch  . omega-3 acid ethyl esters  1 g Oral Daily  . simvastatin  40 mg Oral Daily  . sodium chloride flush  3 mL Intravenous Q12H  . traZODone  50 mg Oral QHS  . vitamin B-12  1,000 mcg Oral Daily   Continuous Infusions:   LOS: 1 day     Cordelia Poche, MD [facility] 04/23/2020, 2:11 PM  If 7PM-7AM, please contact night-coverage [email or web address]

## 2020-04-23 NOTE — Plan of Care (Signed)

## 2020-04-24 ENCOUNTER — Inpatient Hospital Stay (HOSPITAL_COMMUNITY)
Admit: 2020-04-24 | Discharge: 2020-04-24 | Disposition: A | Discharge status: Home / Self Care | Payer: Medicare Other | Attending: Family Medicine | Admitting: Family Medicine

## 2020-04-24 DIAGNOSIS — R0602 Shortness of breath: Secondary | ICD-10-CM | POA: Diagnosis not present

## 2020-04-24 DIAGNOSIS — E1121 Type 2 diabetes mellitus with diabetic nephropathy: Secondary | ICD-10-CM | POA: Diagnosis not present

## 2020-04-24 DIAGNOSIS — R7989 Other specified abnormal findings of blood chemistry: Secondary | ICD-10-CM | POA: Diagnosis not present

## 2020-04-24 DIAGNOSIS — E669 Obesity, unspecified: Secondary | ICD-10-CM | POA: Diagnosis not present

## 2020-04-24 DIAGNOSIS — R778 Other specified abnormalities of plasma proteins: Secondary | ICD-10-CM

## 2020-04-24 DIAGNOSIS — J441 Chronic obstructive pulmonary disease with (acute) exacerbation: Secondary | ICD-10-CM | POA: Diagnosis not present

## 2020-04-24 LAB — RESPIRATORY PANEL BY PCR

## 2020-04-24 LAB — CBC
HCT: 35.5 % — ABNORMAL LOW (ref 36.0–46.0)
Hemoglobin: 12 g/dL (ref 12.0–15.0)
MCH: 32.5 pg (ref 26.0–34.0)
MCHC: 33.8 g/dL (ref 30.0–36.0)
MCV: 96.2 fL (ref 80.0–100.0)
Platelets: 202 10*3/uL (ref 150–400)
RBC: 3.69 MIL/uL — ABNORMAL LOW (ref 3.87–5.11)
RDW: 13 % (ref 11.5–15.5)
WBC: 12.1 10*3/uL — ABNORMAL HIGH (ref 4.0–10.5)
nRBC: 0 % (ref 0.0–0.2)

## 2020-04-24 LAB — ECHOCARDIOGRAM COMPLETE
AR max vel: 2.44 cm2
AV Area VTI: 2.67 cm2
AV Area mean vel: 2.49 cm2
AV Mean grad: 5 mmHg
AV Peak grad: 9.2 mmHg
Ao pk vel: 1.52 m/s
Area-P 1/2: 4.04 cm2
Height: 63 in
MV VTI: 2.93 cm2
S' Lateral: 3 cm
Weight: 3280 oz

## 2020-04-24 LAB — GLUCOSE, CAPILLARY
Glucose-Capillary: 246 mg/dL — ABNORMAL HIGH (ref 70–99)
Glucose-Capillary: 144 mg/dL — ABNORMAL HIGH (ref 70–99)
Glucose-Capillary: 230 mg/dL — ABNORMAL HIGH (ref 70–99)
Glucose-Capillary: 119 mg/dL — ABNORMAL HIGH (ref 70–99)

## 2020-04-24 MED ORDER — PERFLUTREN LIPID MICROSPHERE
1.0000 mL | INTRAVENOUS | Status: AC | PRN
  Administered 2020-04-24: 2 mL via INTRAVENOUS
  Filled 2020-04-24: qty 10

## 2020-04-24 NOTE — Progress Notes (Signed)
OT Cancellation Note  Patient Details Name: Siria Calandro MRN: [id number] DOB: 12-Apr-1935   Cancelled Treatment:    Reason Eval/Treat Not Completed: Other (comment). OT order received and chart reviewed. Pt currently working with PT. OT to re-attempt when pt is next available.   Darleen Crocker, [place], OTR/L , CBIS ascom [phone]  04/24/20, 10:05 AM   04/24/2020, 10:05 AM

## 2020-04-24 NOTE — Progress Notes (Signed)
*  PRELIMINARY RESULTS* Echocardiogram 2D Echocardiogram has been performed.  [place] 04/24/2020, 9:53 AM

## 2020-04-24 NOTE — Progress Notes (Signed)
Inpatient Diabetes Program Recommendations  AACE/ADA: New Consensus Statement on Inpatient Glycemic Control (2015)  Target Ranges:  Prepandial:   less than 140 mg/dL      Peak postprandial:   less than 180 mg/dL (1-2 hours)      Critically ill patients:  140 - 180 mg/dL   Lab Results  Component Value Date   GLUCAP 144 (H) 04/24/2020   HGBA1C 6.5 (H) 04/22/2020    Review of Glycemic Control Results for [place], Jenna FAYE "Zora NAINIKA NEWLUN" (MRN [id number]) as of 04/24/2020 14:04  Ref. Range 04/23/2020 07:34 04/23/2020 11:37 04/23/2020 16:26 04/23/2020 21:09 04/24/2020 08:03 04/24/2020 11:39  Glucose-Capillary Latest Ref Range: 70 - 99 mg/dL 235 (H) 263 (H) 163 (H) 266 (H) 246 (H) 144 (H)   Diabetes history: DM 2 Outpatient Diabetes medications: Diet controlled Current orders for Inpatient glycemic control:  Novolog 0-15 units tid + hs  A1c 6.5% on 4/2  Inpatient Diabetes Program Recommendations:    Note Steroids transitioned today from IV to PO prednisone 40 mg Daily.   If glucose trends increase after meal intake, may consider: -  Novolog 2 units tid meal coverage if eating >50% of meals.  Thanks,  Tama Headings RN, MSN, BC-ADM Inpatient Diabetes Coordinator Team Pager [phone] (8a-5p)

## 2020-04-24 NOTE — Evaluation (Signed)
Physical Therapy Evaluation Patient Details Name: Jenna [name] MRN: [id number] DOB: 05-07-1935 Today's Date: 04/24/2020   History of Present Illness  Pt admitted for obesity with complaints of SOB symptoms. History includes obesity, depression, COPD, DM, and gastric bypass.  Clinical Impression  Pt is a pleasant 85 year old female who was admitted for SOB symptoms. Pt performs bed mobility with min assist, transfers with min assist, and ambulation with cga and RW. Pt initially on 2L of O2, removed for exertion. Maintained at 99% with exertion on RA, however increased SOB symptoms and elevated RPE. Increased edema present in B LE, reports she sleeps in lift chair. Pt demonstrates deficits with strength/endurance/mobility/balance. Pt is very high falls risk and needs supervision for all mobility using AD. Pt appears to be close to baseline level and would like to try therapy in home environment. Would benefit from skilled PT to address above deficits and promote optimal return to PLOF.     Follow Up Recommendations Home health PT;Supervision/Assistance - 24 hour    Equipment Recommendations  None recommended by PT    Recommendations for Other Services       Precautions / Restrictions Precautions Precautions: Fall Restrictions Weight Bearing Restrictions: No      Mobility  Bed Mobility Overal bed mobility: Needs Assistance Bed Mobility: Supine to Sit     Supine to sit: Min assist     General bed mobility comments: Needs assist for B LEs and trunk support to sit at EOB. ONce seated, able to sit with supervision. Does become SOB with exertion, although O2 sats WNL on RA.    Transfers Overall transfer level: Needs assistance Equipment used: Rolling walker (2 wheeled) Transfers: Sit to/from Stand Sit to Stand: Min guard         General transfer comment: Shoes donned prior to transfer. Once standing, wide BOS noted.  Ambulation/Gait Ambulation/Gait assistance: Min  guard Gait Distance (Feet): 10 Feet Assistive device: Rolling walker (2 wheeled) Gait Pattern/deviations: Step-to pattern     General Gait Details: ambulated short distance in room, limited due to incontinence. RW used with patient able to complete turns safely. Becomes quickly SOB with exertion. O2 sats at 99% on RA p exertion.  Stairs            Wheelchair Mobility    Modified [place] (Stroke Patients Only)       Balance Overall balance assessment: Needs assistance;History of Falls Sitting-balance support: Feet supported Sitting balance-Leahy Scale: Good     Standing balance support: Bilateral upper extremity supported Standing balance-Leahy Scale: Fair                               Pertinent Vitals/Pain Pain Assessment: No/denies pain    Home Living Family/patient expects to be discharged to:: Private residence Living Arrangements: Spouse/significant other;Children (daughter) Available Help at Discharge: Family Type of Home: House Home Access: Ramped entrance     Home Layout: One level (does have 1 step to enter kitchen with B grab bars) Home Equipment: Walker - 2 wheels;Walker - 4 wheels (lift chair)      Prior Function Level of Independence: Independent with assistive device(s)         Comments: very limited household ambulator using rollator. Reports multiple falls recently (2 in last 2 weeks). Reports she sleeps in lift chair and is able to get in/out independently     Hand Dominance  Extremity/Trunk Assessment   Upper Extremity Assessment Upper Extremity Assessment: Generalized weakness (B UE grossly 3+/5)    Lower Extremity Assessment Lower Extremity Assessment: Generalized weakness (B LE grossly 2+/5)       Communication   Communication: No difficulties  Cognition Arousal/Alertness: Awake/alert Behavior During Therapy: WFL for tasks assessed/performed Overall Cognitive Status: Within Functional Limits for tasks  assessed                                        General Comments      Exercises Other Exercises Other Exercises: discussed during gait training different equipment, safety awareness, and moniitored vitals. Able to complete turns and perform forward/retro ambulation. Further ambulation deferred due to fatigue and incontience   Assessment/Plan    PT Assessment Patient needs continued PT services  PT Problem List Decreased strength;Decreased activity tolerance;Decreased balance;Decreased mobility;Cardiopulmonary status limiting activity;Obesity       PT Treatment Interventions Gait training;DME instruction;Therapeutic exercise;Balance training    PT Goals (Current goals can be found in the Care Plan section)  Acute Rehab PT Goals Patient Stated Goal: to go home PT Goal Formulation: With patient Time For Goal Achievement: 05/08/20 Potential to Achieve Goals: Good    Frequency Min 2X/week   Barriers to discharge        Co-evaluation               AM-PAC PT "6 Clicks" Mobility  Outcome Measure Help needed turning from your back to your side while in a flat bed without using bedrails?: A Little Help needed moving from lying on your back to sitting on the side of a flat bed without using bedrails?: A Little Help needed moving to and from a bed to a chair (including a wheelchair)?: A Little Help needed standing up from a chair using your arms (e.g., wheelchair or bedside chair)?: A Little Help needed to walk in hospital room?: A Little Help needed climbing 3-5 steps with a railing? : A Lot 6 Click Score: 17    End of Session Equipment Utilized During Treatment: Gait belt;Oxygen Activity Tolerance: Patient tolerated treatment well Patient left: in bed;with bed alarm set;with family/visitor present (refused to sit in recliner, encouraged to sit with meals) Nurse Communication: Mobility status PT Visit Diagnosis: Unsteadiness on feet (R26.81);Muscle  weakness (generalized) (M62.81);Repeated falls (R29.6);Difficulty in walking, not elsewhere classified (R26.2)    Time: 5284-1324 PT Time Calculation (min) (ACUTE ONLY): 27 min   Charges:   PT Evaluation $PT Eval Low Complexity: 1 Low PT Treatments $Gait Training: 8-22 mins        Greggory Stallion, PT, DPT [phone]   Kaela Beitz 04/24/2020, 1:02 PM

## 2020-04-24 NOTE — Evaluation (Signed)
Occupational Therapy Evaluation Patient Details Name: Jenna [name] MRN: [id number] DOB: 1936-01-08 Today's Date: 04/24/2020    History of Present Illness Pt admitted for obesity with complaints of SOB symptoms. History includes obesity, depression, COPD, DM, and gastric bypass.   Clinical Impression   Patient presenting with decreased I in self care, balance, endurance, and safety awareness. Patient reports living at home with husband PTA. Pt utilizes rollator for mobility tasks and husband performs IADLs secondary to pt having increased back pain with mobility/standing. Patient currently functioning at min guard - min A with use of RW for LB dressing and toileting. Pt fatigues quickly.  Patient will benefit from acute OT to increase overall independence in the areas of ADLs, functional mobility, and safety awareness in order to safely discharge home with home health.    Follow Up Recommendations  Home health OT;Supervision - Intermittent    Equipment Recommendations  Other (comment) (defer to next venue of care)       Precautions / Restrictions Precautions Precautions: Fall Restrictions Weight Bearing Restrictions: No      Mobility Bed Mobility Overal bed mobility: Needs Assistance Bed Mobility: Supine to Sit;Sit to Supine     Supine to sit: Min assist Sit to supine: Mod assist   General bed mobility comments: min cuing for technique with assistance for B LEs to return to supine at end of session.    Transfers Overall transfer level: Needs assistance Equipment used: Rolling walker (2 wheeled) Transfers: Sit to/from Stand Sit to Stand: Min guard         General transfer comment: Shoes donned prior to transfer. Once standing, wide BOS noted.    Balance Overall balance assessment: Needs assistance;History of Falls Sitting-balance support: Feet supported Sitting balance-Leahy Scale: Good     Standing balance support: Bilateral upper extremity supported Standing  balance-Leahy Scale: Fair Standing balance comment: reliance on UE support                           ADL either performed or assessed with clinical judgement   ADL Overall ADL's : Needs assistance/impaired     Grooming: Wash/dry hands;Wash/dry face;Oral care;Standing;Min guard;Set up               Lower Body Dressing: Minimal assistance;Sit to/from stand Lower Body Dressing Details (indicate cue type and reason): to thread onto B feet while seated on toilet Toilet Transfer: Minimal assistance;Comfort height toilet;RW   Toileting- Clothing Manipulation and Hygiene: Minimal assistance;Sit to/from stand       Functional mobility during ADLs: Minimal assistance;Min guard;Rolling walker       Vision Patient Visual Report: No change from baseline              Pertinent Vitals/Pain Pain Assessment: 0-10 Pain Score: 6  Pain Location: chronic back pain Pain Descriptors / Indicators: Aching;Discomfort;Dull Pain Intervention(s): Limited activity within patient's tolerance;Monitored during session;Repositioned     Hand Dominance Right   Extremity/Trunk Assessment Upper Extremity Assessment Upper Extremity Assessment: Generalized weakness   Lower Extremity Assessment Lower Extremity Assessment: Generalized weakness       Communication Communication Communication: No difficulties   Cognition Arousal/Alertness: Awake/alert Behavior During Therapy: WFL for tasks assessed/performed Overall Cognitive Status: Within Functional Limits for tasks assessed  Exercises Exercises: Other exercises Other Exercises Other Exercises: discussed during gait training different equipment, safety awareness, and moniitored vitals. Able to complete turns and perform forward/retro ambulation. Further ambulation deferred due to fatigue and incontience        Home Living Family/patient expects to be discharged to:: Private  residence Living Arrangements: Spouse/significant other;Children (daughter) Available Help at Discharge: Family Type of Home: House Home Access: Ramped entrance     Home Layout: One level               Home Equipment: Environmental consultant - 2 wheels;Walker - 4 wheels          Prior Functioning/Environment Level of Independence: Independent with assistive device(s)        Comments: very limited household ambulator using rollator. Reports multiple falls recently (2 in last 2 weeks). Reports she sleeps in lift chair and is able to get in/out independently        OT Problem List: Decreased strength;Impaired balance (sitting and/or standing);Decreased activity tolerance;Decreased safety awareness;Pain;Decreased cognition;Decreased knowledge of use of DME or AE      OT Treatment/Interventions: Self-care/ADL training;Therapeutic exercise;Energy conservation;Cognitive remediation/compensation;Therapeutic activities;Balance training;Patient/family education;Manual therapy;DME and/or AE instruction    OT Goals(Current goals can be found in the care plan section) Acute Rehab OT Goals Patient Stated Goal: to go home OT Goal Formulation: With patient Time For Goal Achievement: 05/08/20 Potential to Achieve Goals: Good ADL Goals Pt Will Perform Grooming: with modified independence;standing Pt Will Perform Lower Body Dressing: with modified independence;sit to/from stand Pt Will Transfer to Toilet: with modified independence;ambulating Pt Will Perform Toileting - Clothing Manipulation and hygiene: with modified independence;sit to/from stand  OT Frequency: Min 2X/week   Barriers to D/C:    none known at this time          AM-PAC OT "6 Clicks" Daily Activity     Outcome Measure Help from another person eating meals?: A Little Help from another person taking care of personal grooming?: A Little Help from another person toileting, which includes using toliet, bedpan, or urinal?: A  Little Help from another person bathing (including washing, rinsing, drying)?: A Little Help from another person to put on and taking off regular upper body clothing?: A Little Help from another person to put on and taking off regular lower body clothing?: A Little 6 Click Score: 18   End of Session Equipment Utilized During Treatment: Rolling walker  Activity Tolerance: Patient tolerated treatment well Patient left: in bed;with call bell/phone within reach  OT Visit Diagnosis: Unsteadiness on feet (R26.81);Muscle weakness (generalized) (M62.81)                Time: 6644-0347 OT Time Calculation (min): 42 min Charges:  OT General Charges $OT Visit: 1 Visit OT Evaluation $OT Eval Moderate Complexity: 1 Mod OT Treatments $Self Care/Home Management : 23-37 mins   Darleen Crocker, MS, OTR/L , CBIS ascom [phone]  04/24/20, 4:02 PM  04/24/2020, 4:01 PM

## 2020-04-24 NOTE — TOC Initial Note (Addendum)
Transition of Care [facility]) - Initial/Assessment Note    Patient Details  Name: Jenna [name] MRN: [id number] Date of Birth: June 26, 1935  Transition of [facility] CM/SW Contact:    Candie Chroman, LCSW Phone Number: 04/24/2020, 3:21 PM  Clinical Narrative:   CSW met with patient. Husband at bedside. CSW introduced role and explained that PT recommendations would be discussed. Patient and her husband are agreeable to home health. [facility] and [facility] unable to accept. Left messages for [facility], [facility], [facility], and [facility]. Patient has a walker, rollator, crutches, cane, and raised toilet seat at home. Patient is on 2 L acute oxygen. Will follow for this need as well. No further concerns. CSW encouraged patient and her husband to contact CSW as needed. CSW will continue to follow patient for support and facilitate return home when stable.       4:06 pm: [facility], [facility], and [facility] unable to accept referral. [facility] representative is checking.  Expected Discharge Plan: [place] Barriers to Discharge: Continued Medical Work up   Patient Goals and CMS Choice   CMS Medicare.gov Compare Post Acute Care list provided to:: Patient (Husband at bedside)    Expected Discharge Plan and Services Expected Discharge Plan: [place] [facility]     Post Acute Care Choice: [place] arrangements for the past 2 months: Single Family Home                                      Prior Living Arrangements/Services Living arrangements for the past 2 months: Single Family Home Lives with:: Spouse Patient language and need for interpreter reviewed:: Yes Do you feel safe going back to the place where you live?: Yes      Need for Family Participation in Patient Care: Yes (Comment) Care giver support system in place?: Yes (comment) Current home services: DME Criminal Activity/Legal Involvement Pertinent to Current Situation/Hospitalization:  No - Comment as needed  Activities of Daily Living Home Assistive Devices/Equipment: Crutches,Nebulizer,Walker (specify type),Eyeglasses,Dentures (specify type) ADL Screening (condition at time of admission) Patient's cognitive ability adequate to safely complete daily activities?: No Is the patient deaf or have difficulty hearing?: No Does the patient have difficulty seeing, even when wearing glasses/contacts?: No Does the patient have difficulty concentrating, remembering, or making decisions?: No Patient able to express need for assistance with ADLs?: Yes Does the patient have difficulty dressing or bathing?: No Independently performs ADLs?: Yes (appropriate for developmental age) Does the patient have difficulty walking or climbing stairs?: Yes Weakness of Legs: Both Weakness of Arms/Hands: Left  Permission Sought/Granted Permission sought to share information with : Facility Art therapist granted to share information with : Yes, Verbal Permission Granted  Share Information with NAME: [name]  Permission granted to share info w AGENCY: [place] granted to share info w Relationship: Spouse  Permission granted to share info w Contact Information: [phone]  Emotional Assessment Appearance:: Appears stated age Attitude/Demeanor/Rapport: Engaged,Gracious Affect (typically observed): Accepting,Appropriate,Calm,Pleasant Orientation: : Oriented to Self,Oriented to Place,Oriented to  Time,Oriented to Situation Alcohol / Substance Use: Not Applicable Psych Involvement: No (comment)  Admission diagnosis:  COPD exacerbation ([place]) [J44.1] Patient Active Problem List   Diagnosis Date Noted  . Gastric bypass status for obesity 04/22/2020  . COPD exacerbation ([place]) 04/22/2020  . Age-related osteoporosis without current pathological fracture 06/03/2019  . PSVT (paroxysmal supraventricular tachycardia) (  [place]) 02/01/2019  . 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 Mild nonproliferative diabetic retinopathy of right eye without macular edema ([place]) 10/13/2018  . Controlled type 2 diabetes mellitus with diabetic nephropathy, without long-term current use of insulin ([place]) 11/13/2017  . Hyperlipidemia associated with type 2 diabetes mellitus ([place]) 11/13/2017  . Depression, major, recurrent ([place]) 11/12/2017  . Psychophysiological insomnia 11/12/2017  . Primary osteoarthritis of both hands 11/12/2017  . Primary osteoarthritis involving multiple joints 11/12/2017  . Centrilobular emphysema ([place]) 11/12/2017  . Obesity (BMI 35.0-39.9 without comorbidity) 11/13/2013  . Esophageal reflux 11/13/2013  . Bilateral leg edema 11/13/2013  . Chronic cough 11/13/2013  . Hx of adenomatous colonic polyps 10/15/2013   PCP:  Olin Hauser, DO Pharmacy:   [facility] [address] [facility] [address] [place], [place] [address] Phone: [phone] Fax: [phone]     Social Determinants of Health (SDOH) Interventions    Readmission Risk Interventions No flowsheet data found.

## 2020-04-24 NOTE — Plan of Care (Signed)
  Problem: Education: Goal: Knowledge of General Education information will improve Description: Including pain rating scale, medication(s)/side effects and non-pharmacologic comfort measures Outcome: Progressing   Problem: Clinical Measurements: Goal: Ability to maintain clinical measurements within normal limits will improve Outcome: Progressing Goal: Will remain free from infection Outcome: Progressing Goal: Respiratory complications will improve Outcome: Progressing   Problem: Nutrition: Goal: Adequate nutrition will be maintained Outcome: Progressing

## 2020-04-24 NOTE — Progress Notes (Signed)
PROGRESS NOTE    Jenna [name]  ZLD:[id number] DOB: 1935/01/23 DOA: 04/22/2020 PCP: Olin Hauser, DO   Brief Narrative: Jenna [name] is a 85 y.o. female with a history of COPD, diabetes, gastric bypass, obesity. Patient presented secondary to shortness of breath and found to have a COPD exacerbation. Steroids and Duonebs started.   Assessment & Plan:   Active Problems:   Obesity (BMI 35.0-39.9 without comorbidity)   Depression, major, recurrent (HCC)   Centrilobular emphysema (HCC)   Controlled type 2 diabetes mellitus with diabetic nephropathy, without long-term current use of insulin (HCC)   PSVT (paroxysmal supraventricular tachycardia) (HCC)   Gastric bypass status for obesity   COPD exacerbation (HCC)   COPD exacerbation History of exacerbation in the past. Unsure of etiology of this exacerbation. She does have seasonal allergies. Mostly upper airway wheezing on exam, though which may be related to GERD or vocal cord issue. No recent surgeries. -Continue Prednisone -Continue Doxycycline -Continue Duonebs  Acute respiratory failure with hypoxia Secondary to above -Wean to room air as able  Demand ischemia No chest pain. Mildly elevated and downtrending. Transthoracic Echocardiogram ordered and without wall-motion abnormalities or ventricular dysfunction.  Pericardial effusion Mild. Asymptomatic.  Depression -Continue citalopram  GERD -Continue famotidine  Diabetes mellitus, type 2 Patient is not on medications as an outpatient. Last hemoglobin A1C of 6.5 which is well controlled. -Diet control recommended  History of anemia Not currently anemic. On iron supplementation -Continue iron supplementation  Primary hypertension -Continue metoprolol  Hyperlipidemia -Continue simvastatin  Insomnia -Continue trazodone  Obesity Body mass index is 36.31 kg/m.   DVT prophylaxis: Lovenox Code Status:   Code Status: Full Code Family  Communication: Daughter and significant other at bedside Disposition Plan: Discharge home likely in 1-3 days pending improvement of dyspnea, return to functional baseline. PT recommending [place]   Consultants:   None  Procedures:   None  Antimicrobials:  Doxycycline    Subjective: Some improvement of dyspnea. Still significantly dyspneic with ambulation.  Objective: Vitals:   04/24/20 0020 04/24/20 0550 04/24/20 0741 04/24/20 0814  BP: (!) 114/48 (!) 143/81 124/75   Pulse: 71 72 82   Resp: 20 (!) 24 16   Temp: 98.1 F (36.7 C) 97.6 F (36.4 C) 97.8 F (36.6 C)   TempSrc: Oral Oral Oral   SpO2: 94% 99% 100% 98%  Weight:      Height:        Intake/Output Summary (Last 24 hours) at 04/24/2020 1320 Last data filed at 04/24/2020 1033 Gross per 24 hour  Intake 240 ml  Output --  Net 240 ml   Filed Weights   04/22/20 1231  Weight: 93 kg    Examination:  General exam: Appears calm and comfortable Respiratory system: Mild wheezing. Respiratory effort normal. Cardiovascular system: S1 & S2 heard, RRR. No murmurs, rubs, gallops or clicks. Gastrointestinal system: Abdomen is nondistended, soft and nontender. No organomegaly or masses felt. Normal bowel sounds heard. Central nervous system: Alert and oriented. No focal neurological deficits. Musculoskeletal: No edema. No calf tenderness Skin: No cyanosis. No rashes Psychiatry: Judgement and insight appear normal. Mood & affect appropriate.     Data Reviewed: I have personally reviewed following labs and imaging studies  CBC Lab Results  Component Value Date   WBC 12.1 (H) 04/24/2020   RBC 3.69 (L) 04/24/2020   HGB 12.0 04/24/2020   HCT 35.5 (L) 04/24/2020   MCV 96.2 04/24/2020   MCH 32.5 04/24/2020   PLT  202 04/24/2020   MCHC 33.8 04/24/2020   RDW 13.0 04/24/2020   LYMPHSABS 5.2 (H) 04/22/2020   MONOABS 0.8 04/22/2020   EOSABS 0.1 04/22/2020   BASOSABS 0.1 82/50/5397     Last metabolic panel Lab Results   Component Value Date   NA 137 04/22/2020   K 4.9 04/22/2020   CL 104 04/22/2020   CO2 23 04/22/2020   BUN 23 04/22/2020   CREATININE 1.06 (H) 04/22/2020   GLUCOSE 160 (H) 04/22/2020   GFRNONAA 52 (L) 04/22/2020   GFRAA 67 01/31/2020   CALCIUM 8.9 04/22/2020   PHOS 2.8 06/16/2016   PROT 6.4 (L) 04/22/2020   ALBUMIN 3.7 04/22/2020   BILITOT 1.1 04/22/2020   ALKPHOS 52 04/22/2020   AST 51 (H) 04/22/2020   ALT 44 04/22/2020   ANIONGAP 10 04/22/2020    CBG (last 3)  Recent Labs    04/23/20 2109 04/24/20 0803 04/24/20 1139  GLUCAP 266* 246* 144*     GFR: Estimated Creatinine Clearance: 44.3 mL/min (A) (by C-G formula based on SCr of 1.06 mg/dL (H)).  Coagulation Profile: Recent Labs  Lab 04/22/20 1241  INR 1.0    Recent Results (from the past 240 hour(s))  Blood culture (routine x 2)     Status: None (Preliminary result)   Collection Time: 04/22/20 12:41 PM   Specimen: BLOOD  Result Value Ref Range Status   Specimen Description BLOOD  RIGHT WRIST  Final   Special Requests   Final    BOTTLES DRAWN AEROBIC AND ANAEROBIC Blood Culture results may not be optimal due to an inadequate volume of blood received in culture bottles   Culture   Final    NO GROWTH 2 DAYS Performed at [facility], [address], [place], [place] [address]    Report Status PENDING  Incomplete  Resp Panel by RT-PCR (Flu A&B, Covid) Nasopharyngeal Swab     Status: None   Collection Time: 04/22/20 12:47 PM   Specimen: Nasopharyngeal Swab; Nasopharyngeal(NP) swabs in vial transport medium  Result Value Ref Range Status   SARS Coronavirus 2 by RT PCR NEGATIVE NEGATIVE Final    Comment: (NOTE) SARS-CoV-2 target nucleic acids are NOT DETECTED.  The SARS-CoV-2 RNA is generally detectable in upper respiratory specimens during the acute phase of infection. The lowest concentration of SARS-CoV-2 viral copies this assay can detect is 138 copies/mL. A negative result does not preclude  SARS-Cov-2 infection and should not be used as the sole basis for treatment or other patient management decisions. A negative result may occur with  improper specimen collection/handling, submission of specimen other than nasopharyngeal swab, presence of viral mutation(s) within the areas targeted by this assay, and inadequate number of viral copies(<138 copies/mL). A negative result must be combined with clinical observations, patient history, and epidemiological information. The expected result is Negative.  Fact Sheet for Patients:  EntrepreneurPulse.com.au  Fact Sheet for Healthcare Providers:  IncredibleEmployment.be  This test is no t yet approved or cleared by the [place] FDA and  has been authorized for detection and/or diagnosis of SARS-CoV-2 by FDA under an Emergency Use Authorization (EUA). This EUA will remain  in effect (meaning this test can be used) for the duration of the COVID-19 declaration under Section 564(b)(1) of the Act, 21 U.S.C.section 360bbb-3(b)(1), unless the authorization is terminated  or revoked sooner.       Influenza A by PCR NEGATIVE NEGATIVE Final   Influenza B by PCR NEGATIVE NEGATIVE Final  Comment: (NOTE) The Xpert Xpress SARS-CoV-2/FLU/RSV plus assay is intended as an aid in the diagnosis of influenza from Nasopharyngeal swab specimens and should not be used as a sole basis for treatment. Nasal washings and aspirates are unacceptable for Xpert Xpress SARS-CoV-2/FLU/RSV testing.  Fact Sheet for Patients: EntrepreneurPulse.com.au  Fact Sheet for Healthcare Providers: IncredibleEmployment.be  This test is not yet approved or cleared by the [place] FDA and has been authorized for detection and/or diagnosis of SARS-CoV-2 by FDA under an Emergency Use Authorization (EUA). This EUA will remain in effect (meaning this test can be used) for the duration of  the COVID-19 declaration under Section 564(b)(1) of the Act, 21 U.S.C. section 360bbb-3(b)(1), unless the authorization is terminated or revoked.  Performed at [facility], [address] [place], [place] [address]   Blood culture (routine x 2)     Status: None (Preliminary result)   Collection Time: 04/22/20  1:06 PM   Specimen: BLOOD  Result Value Ref Range Status   Specimen Description BLOOD  RIGHT ARM  Final   Special Requests   Final    BOTTLES DRAWN AEROBIC AND ANAEROBIC Blood Culture adequate volume   Culture   Final    NO GROWTH 2 DAYS Performed at [facility], [address], [place], [place] [address]    Report Status PENDING  Incomplete  Culture, sputum-assessment     Status: None   Collection Time: 04/22/20  9:48 PM   Specimen: Sputum  Result Value Ref Range Status   Specimen Description SPUTUM  Final   Special Requests NONE  Final   Sputum evaluation   Final    Sputum specimen not acceptable for testing.  Please recollect.   C/MARY ANN RIBAY PAGDATOON AT [id number] 04/23/20.PMF Performed at [facility], [address] [place], [place] [address]    Report Status 04/23/2020 FINAL  Final  Respiratory (~20 pathogens) panel by PCR     Status: None   Collection Time: 04/23/20  2:35 PM   Specimen: Nasopharyngeal Swab; Respiratory  Result Value Ref Range Status   Adenovirus NOT DETECTED NOT DETECTED Final   Coronavirus 229E NOT DETECTED NOT DETECTED Final    Comment: (NOTE) The Coronavirus on the Respiratory Panel, DOES NOT test for the novel  Coronavirus (2019 nCoV)    Coronavirus HKU1 NOT DETECTED NOT DETECTED Final   Coronavirus NL63 NOT DETECTED NOT DETECTED Final   Coronavirus OC43 NOT DETECTED NOT DETECTED Final   Metapneumovirus NOT DETECTED NOT DETECTED Final   Rhinovirus / Enterovirus NOT DETECTED NOT DETECTED Final   Influenza A NOT DETECTED NOT DETECTED Final   Influenza B NOT DETECTED NOT DETECTED Final   Parainfluenza Virus  1 NOT DETECTED NOT DETECTED Final   Parainfluenza Virus 2 NOT DETECTED NOT DETECTED Final   Parainfluenza Virus 3 NOT DETECTED NOT DETECTED Final   Parainfluenza Virus 4 NOT DETECTED NOT DETECTED Final   Respiratory Syncytial Virus NOT DETECTED NOT DETECTED Final   Bordetella pertussis NOT DETECTED NOT DETECTED Final   Bordetella Parapertussis NOT DETECTED NOT DETECTED Final   Chlamydophila pneumoniae NOT DETECTED NOT DETECTED Final   Mycoplasma pneumoniae NOT DETECTED NOT DETECTED Final    Comment: Performed at [facility], [place] [place], [place] [address]        Radiology Studies: CT Angio Chest PE W and/or Wo Contrast  Result Date: 04/22/2020 CLINICAL DATA:  Respiratory distress. Pulmonary embolus suspected. Absent breath sounds. EXAM: CT ANGIOGRAPHY CHEST WITH  CONTRAST TECHNIQUE: Multidetector CT imaging of the chest was performed using the standard protocol during bolus administration of intravenous contrast. Multiplanar CT image reconstructions and MIPs were obtained to evaluate the vascular anatomy. CONTRAST:  62mL OMNIPAQUE IOHEXOL 350 MG/ML SOLN COMPARISON:  CT angio chest 06/17/2016 FINDINGS: Cardiovascular: Satisfactory opacification of the pulmonary arteries to the segmental level. No evidence of pulmonary embolism. Normal heart size. No significant pericardial effusion. The thoracic aorta is normal in caliber. Mild atherosclerotic plaque of the thoracic aorta. Mild left anterior descending coronary artery calcifications. Mediastinum/Nodes: No enlarged mediastinal, hilar, or axillary lymph nodes. Thyroid gland, trachea, and esophagus demonstrate no significant findings. Tiny hiatal hernia. Lungs/Pleura: Pulmonary micronodule within the right upper lobe (7:43). Left lower lobe calcified pulmonary micronodule (7:72). Several other scattered pulmonary micronodules. No pulmonary mass. No focal consolidation. No pleural effusion. No pneumothorax. Upper Abdomen: Gastric  surgical changes noted. Status post cholecystectomy. Atherosclerotic plaque. Musculoskeletal: No chest wall abnormality. No suspicious lytic or blastic osseous lesions. No acute displaced fracture. Review of the MIP images confirms the above findings. IMPRESSION: 1. No pulmonary embolus. 2. No acute intrathoracic abnormality. 3. Tiny hiatal hernia in a patient status post gastric surgery. Electronically Signed   By: Iven Finn M.D.   On: 04/22/2020 18:27   ECHOCARDIOGRAM COMPLETE  Result Date: 04/24/2020    ECHOCARDIOGRAM REPORT   Patient Name:   TERYL GUBLER Rahl Date of Exam: 04/24/2020 Medical Rec #:  [id number]        Height:       63.0 in Accession #:    [id number]       Weight:       205.0 lb Date of Birth:  February 26, 1935         BSA:          1.954 m Patient Age:    82 years         BP:           143/81 mmHg Patient Gender: F                HR:           83 bpm. Exam Location:  [facility] Procedure: 2D Echo, Color Doppler, Cardiac Doppler and Intracardiac            Opacification Agent Indications:     Elevated troponin  History:         Patient has no prior history of Echocardiogram examinations.                  COPD; Risk Factors:Sleep Apnea, Diabetes and Dyslipidemia.  Sonographer:     Charmayne Sheer RDCS (AE) Referring Phys:  [id number] Annice Needy ECKSTAT Diagnosing Phys: Kathlyn Sacramento MD  Sonographer Comments: Suboptimal apical window and no subcostal window. IMPRESSIONS  1. Left ventricular ejection fraction, by estimation, is 60 to 65%. The left ventricle has normal function. Left ventricular endocardial border not optimally defined to evaluate regional wall motion. Left ventricular diastolic parameters were normal.  2. Right ventricular systolic function is normal. The right ventricular size is normal. Tricuspid regurgitation signal is inadequate for assessing PA pressure.  3. Left atrial size was mildly dilated.  4. A small pericardial effusion is present. The pericardial effusion is posterior to the left  ventricle.  5. The mitral valve is normal in structure. No evidence of mitral valve regurgitation. No evidence of mitral stenosis.  6. The aortic valve is normal in structure. Aortic valve regurgitation is not visualized. No aortic stenosis  is present.  7. The inferior vena cava is dilated in size with >50% respiratory variability, suggesting right atrial pressure of 8 mmHg. FINDINGS  Left Ventricle: Left ventricular ejection fraction, by estimation, is 60 to 65%. The left ventricle has normal function. Left ventricular endocardial border not optimally defined to evaluate regional wall motion. Definity contrast agent was given IV to delineate the left ventricular endocardial borders. The left ventricular internal cavity size was normal in size. There is no left ventricular hypertrophy. Left ventricular diastolic parameters were normal. Right Ventricle: The right ventricular size is normal. No increase in right ventricular wall thickness. Right ventricular systolic function is normal. Tricuspid regurgitation signal is inadequate for assessing PA pressure. Left Atrium: Left atrial size was mildly dilated. Right Atrium: Right atrial size was normal in size. Pericardium: A small pericardial effusion is present. The pericardial effusion is posterior to the left ventricle. Mitral Valve: The mitral valve is normal in structure. No evidence of mitral valve regurgitation. No evidence of mitral valve stenosis. MV peak gradient, 3.8 mmHg. The mean mitral valve gradient is 2.0 mmHg. Tricuspid Valve: The tricuspid valve is normal in structure. Tricuspid valve regurgitation is trivial. No evidence of tricuspid stenosis. Aortic Valve: The aortic valve is normal in structure. Aortic valve regurgitation is not visualized. No aortic stenosis is present. Aortic valve mean gradient measures 5.0 mmHg. Aortic valve peak gradient measures 9.2 mmHg. Aortic valve area, by VTI measures 2.67 cm. Pulmonic Valve: The pulmonic valve was normal  in structure. Pulmonic valve regurgitation is not visualized. No evidence of pulmonic stenosis. Aorta: The aortic root is normal in size and structure. Venous: The inferior vena cava is dilated in size with greater than 50% respiratory variability, suggesting right atrial pressure of 8 mmHg. IAS/Shunts: No atrial level shunt detected by color flow Doppler.  LEFT VENTRICLE PLAX 2D LVIDd:         4.80 cm  Diastology LVIDs:         3.00 cm  LV e' medial:    7.94 cm/s LV PW:         1.50 cm  LV E/e' medial:  10.7 LV IVS:        0.60 cm  LV e' lateral:   10.80 cm/s LVOT diam:     2.10 cm  LV E/e' lateral: 7.9 LV SV:         68 LV SV Index:   35 LVOT Area:     3.46 cm  LEFT ATRIUM             Index LA diam:        4.60 cm 2.35 cm/m LA Vol (A2C):   39.6 ml 20.27 ml/m LA Vol (A4C):   55.8 ml 28.56 ml/m LA Biplane Vol: 52.4 ml 26.82 ml/m  AORTIC VALVE                   PULMONIC VALVE AV Area (Vmax):    2.44 cm    PV Vmax:       1.02 m/s AV Area (Vmean):   2.49 cm    PV Vmean:      65.400 cm/s AV Area (VTI):     2.67 cm    PV VTI:        0.192 m AV Vmax:           152.00 cm/s PV Peak grad:  4.2 mmHg AV Vmean:          99.800 cm/s PV Mean grad:  2.0 mmHg AV VTI:            0.256 m AV Peak Grad:      9.2 mmHg AV Mean Grad:      5.0 mmHg LVOT Vmax:         107.00 cm/s LVOT Vmean:        71.800 cm/s LVOT VTI:          0.197 m LVOT/AV VTI ratio: 0.77  AORTA Ao Root diam: 2.50 cm MITRAL VALVE MV Area (PHT): 4.04 cm    SHUNTS MV Area VTI:   2.93 cm    Systemic VTI:  0.20 m MV Peak grad:  3.8 mmHg    Systemic Diam: 2.10 cm MV Mean grad:  2.0 mmHg MV Vmax:       0.98 m/s MV Vmean:      60.7 cm/s MV Decel Time: 188 msec MV E velocity: 85.30 cm/s MV A velocity: 57.80 cm/s MV E/A ratio:  1.48 Kathlyn Sacramento MD Electronically signed by Kathlyn Sacramento MD Signature Date/Time: 04/24/2020/12:13:14 PM    Final         Scheduled Meds: . vitamin C  500 mg Oral Daily  . cholecalciferol  1,000 Units Oral Daily  . citalopram  40  mg Oral Daily  . doxycycline  100 mg Oral Q12H  . enoxaparin (LOVENOX) injection  50 mg Subcutaneous Q24H  . famotidine  20 mg Oral QHS  . ferrous sulfate  325 mg Oral QHS  . furosemide  20 mg Oral Daily  . insulin aspart  0-15 Units Subcutaneous TID WC  . insulin aspart  0-5 Units Subcutaneous QHS  . ipratropium-albuterol  3 mL Nebulization Q6H  . ipratropium-albuterol  3 mL Nebulization Once  . metoprolol succinate  25 mg Oral Daily  . multivitamin with minerals  1 tablet Oral Q lunch  . omega-3 acid ethyl esters  1 g Oral Daily  . predniSONE  40 mg Oral Q breakfast  . simvastatin  40 mg Oral Daily  . sodium chloride flush  3 mL Intravenous Q12H  . traZODone  50 mg Oral QHS  . vitamin B-12  1,000 mcg Oral Daily   Continuous Infusions:   LOS: 2 days     Cordelia Poche, MD [facility] 04/24/2020, 1:20 PM  If 7PM-7AM, please contact night-coverage [email or web address]

## 2020-04-25 DIAGNOSIS — E669 Obesity, unspecified: Secondary | ICD-10-CM | POA: Diagnosis not present

## 2020-04-25 DIAGNOSIS — E1121 Type 2 diabetes mellitus with diabetic nephropathy: Secondary | ICD-10-CM | POA: Diagnosis not present

## 2020-04-25 DIAGNOSIS — J441 Chronic obstructive pulmonary disease with (acute) exacerbation: Secondary | ICD-10-CM | POA: Diagnosis not present

## 2020-04-25 LAB — GLUCOSE, CAPILLARY
Glucose-Capillary: 118 mg/dL — ABNORMAL HIGH (ref 70–99)
Glucose-Capillary: 183 mg/dL — ABNORMAL HIGH (ref 70–99)
Glucose-Capillary: 254 mg/dL — ABNORMAL HIGH (ref 70–99)
Glucose-Capillary: 193 mg/dL — ABNORMAL HIGH (ref 70–99)

## 2020-04-25 MED ORDER — METHYLPREDNISOLONE SODIUM SUCC 40 MG IJ SOLR
40.0000 mg | Freq: Every day | INTRAMUSCULAR | Status: DC
  Administered 2020-04-25 – 2020-04-27 (×3): 40 mg via INTRAVENOUS
  Filled 2020-04-25 (×3): qty 1

## 2020-04-25 MED ORDER — BENZONATATE 100 MG PO CAPS
200.0000 mg | ORAL_CAPSULE | Freq: Two times a day (BID) | ORAL | Status: DC
  Administered 2020-04-25 – 2020-04-28 (×7): 200 mg via ORAL
  Filled 2020-04-25 (×7): qty 2

## 2020-04-25 MED ORDER — GUAIFENESIN ER 600 MG PO TB12
1200.0000 mg | ORAL_TABLET | Freq: Two times a day (BID) | ORAL | Status: DC
  Administered 2020-04-25 – 2020-04-28 (×7): 1200 mg via ORAL
  Filled 2020-04-25 (×7): qty 2

## 2020-04-25 MED ORDER — HYDROCODONE-HOMATROPINE 5-1.5 MG/5ML PO SYRP
5.0000 mL | ORAL_SOLUTION | ORAL | Status: DC | PRN
Start: 2020-04-25 — End: 2020-04-28
  Administered 2020-04-25 – 2020-04-28 (×7): 5 mL via ORAL
  Filled 2020-04-25 (×7): qty 5

## 2020-04-25 MED ORDER — IPRATROPIUM-ALBUTEROL 0.5-2.5 (3) MG/3ML IN SOLN: 3.0000 mL | RESPIRATORY_TRACT | Status: DC

## 2020-04-25 MED ORDER — HYDROXYZINE HCL 25 MG PO TABS
25.0000 mg | ORAL_TABLET | Freq: Three times a day (TID) | ORAL | Status: DC
  Administered 2020-04-25 – 2020-04-28 (×9): 25 mg via ORAL
  Filled 2020-04-25 (×9): qty 1

## 2020-04-25 MED ORDER — IPRATROPIUM-ALBUTEROL 0.5-2.5 (3) MG/3ML IN SOLN
3.0000 mL | RESPIRATORY_TRACT | Status: DC
Start: 2020-04-25 — End: 2020-04-26
  Administered 2020-04-25 – 2020-04-26 (×7): 3 mL via RESPIRATORY_TRACT
  Filled 2020-04-25 (×6): qty 3

## 2020-04-25 MED ORDER — PANTOPRAZOLE SODIUM 40 MG PO TBEC
40.0000 mg | DELAYED_RELEASE_TABLET | Freq: Every day | ORAL | Status: DC
  Administered 2020-04-25 – 2020-04-28 (×4): 40 mg via ORAL
  Filled 2020-04-25 (×4): qty 1

## 2020-04-25 NOTE — Progress Notes (Signed)
Patient's CBG was 118 this morning at 0800am. Glucose meter error message said rejected. No insulin needed this morning for coverage.

## 2020-04-25 NOTE — Care Management Important Message (Signed)
Important Message  Patient Details  Name: Jenna [name] MRN: [id number] Date of Birth: 1935/07/27   Medicare Important Message Given:  Yes     Dannette Barbara 04/25/2020, 11:16 AM

## 2020-04-25 NOTE — Progress Notes (Signed)
Occupational Therapy Treatment Patient Details Name: Jenna [name] MRN: [id number] DOB: 08-31-1935 Today's Date: 04/25/2020    History of present illness Pt admitted for obesity with complaints of SOB symptoms. History includes obesity, depression, COPD, DM, and gastric bypass.   OT comments  Upon entering the room, pt supine in bed and sleeping soundly. Pt's husband present as well. Pt aroused easily to participate in OT intervention this session. Pt requests need for toileting. Pt performed supine >sit with min A to EOB. Pt donning shoes while seated EOB and then ambulating with RW and min guard to bathroom.Pt able to void and have BM. Pt engaged in bathing tasks while seated on commode with set up A for UB self care. Pt standing to wash peri area secondary to body habitus with min A for standing balance. Pt returning to seated position with min A to thread brief onto B feet and pt stands to pull over B hips. Pt then exiting the bathroom and ambulates 15' to sink to stand for grooming tasks with close supervision. Pt returning to bed at end of session with all needs within reach.   Follow Up Recommendations  Home health OT;Supervision - Intermittent    Equipment Recommendations  None recommended by OT       Precautions / Restrictions Precautions Precautions: Fall       Mobility Bed Mobility Overal bed mobility: Needs Assistance Bed Mobility: Supine to Sit;Sit to Supine     Supine to sit: Min assist Sit to supine: Mod assist   General bed mobility comments: min cuing for technique with assistance for B LEs to return to supine at end of session.    Transfers Overall transfer level: Needs assistance Equipment used: Rolling walker (2 wheeled) Transfers: Sit to/from Stand Sit to Stand: Min guard              Balance Overall balance assessment: Needs assistance;History of Falls Sitting-balance support: Feet supported Sitting balance-Leahy Scale: Good     Standing  balance support: Bilateral upper extremity supported Standing balance-Leahy Scale: Fair Standing balance comment: reliance on UE support                           ADL either performed or assessed with clinical judgement   ADL Overall ADL's : Needs assistance/impaired     Grooming: Wash/dry hands;Wash/dry face;Oral care;Standing;Set up;Supervision/safety   Upper Body Bathing: Set up;Supervision/ safety;Sitting   Lower Body Bathing: Minimal assistance;Sit to/from stand   Upper Body Dressing : Set up;Sitting   Lower Body Dressing: Minimal assistance;Sit to/from stand   Toilet Transfer: Min guard;RW   Toileting- Water quality scientist and Hygiene: Min guard;Sit to/from stand       Functional mobility during ADLs: Minimal assistance;Min guard;Rolling walker       Vision Patient Visual Report: No change from baseline            Cognition Arousal/Alertness: Awake/alert Behavior During Therapy: WFL for tasks assessed/performed Overall Cognitive Status: Within Functional Limits for tasks assessed                                                     Pertinent Vitals/ Pain       Pain Assessment: Faces Faces Pain Scale: Hurts little more Pain Location: chronic back pain Pain Descriptors /  Indicators: Aching;Discomfort;Dull Pain Intervention(s): Limited activity within patient's tolerance;Monitored during session;Repositioned         Frequency  Min 2X/week        Progress Toward Goals  OT Goals(current goals can now be found in the care plan section)  Progress towards OT goals: Progressing toward goals  Acute Rehab OT Goals Patient Stated Goal: to go home OT Goal Formulation: With patient Time For Goal Achievement: 05/08/20 Potential to Achieve Goals: Good  Plan Discharge plan remains appropriate       AM-PAC OT "6 Clicks" Daily Activity     Outcome Measure   Help from another person eating meals?: A Little Help from  another person taking care of personal grooming?: A Little Help from another person toileting, which includes using toliet, bedpan, or urinal?: A Little Help from another person bathing (including washing, rinsing, drying)?: A Little Help from another person to put on and taking off regular upper body clothing?: A Little Help from another person to put on and taking off regular lower body clothing?: A Little 6 Click Score: 18    End of Session Equipment Utilized During Treatment: Rolling walker  OT Visit Diagnosis: Unsteadiness on feet (R26.81);Muscle weakness (generalized) (M62.81)   Activity Tolerance Patient tolerated treatment well   Patient Left in bed;with call bell/phone within reach   Nurse Communication Mobility status        Time: 0102-7253 OT Time Calculation (min): 43 min  Charges: OT General Charges $OT Visit: 1 Visit OT Treatments $Self Care/Home Management : 38-52 mins  Darleen Crocker, MS, OTR/L , CBIS ascom [phone]  04/25/20, 4:38 PM

## 2020-04-25 NOTE — Progress Notes (Signed)
PT Cancellation Note  Patient Details Name: Jenna [name] MRN: [id number] DOB: 07/12/1935   Cancelled Treatment:    Reason Eval/Treat Not Completed: Other (comment). Pt reports she just finished working with OT and is exhausted. Politely refused therapy at this time. Will re-attempt another date.   Abdulaziz Toman 04/25/2020, 4:10 PM Greggory Stallion, PT, DPT [phone]

## 2020-04-25 NOTE — Progress Notes (Signed)
PROGRESS NOTE    Jenna [name]  EHM:[id number] DOB: 12-Jun-1935 DOA: 04/22/2020 PCP: Olin Hauser, DO   Brief Narrative: Jenna [name] is a 85 y.o. female with a history of COPD, diabetes, gastric bypass, obesity. Patient presented secondary to shortness of breath and found to have a COPD exacerbation. Steroids and Duonebs started.   Assessment & Plan:   Active Problems:   Obesity (BMI 35.0-39.9 without comorbidity)   Depression, major, recurrent (HCC)   Centrilobular emphysema (HCC)   Controlled type 2 diabetes mellitus with diabetic nephropathy, without long-term current use of insulin (HCC)   PSVT (paroxysmal supraventricular tachycardia) (HCC)   Gastric bypass status for obesity   COPD exacerbation (HCC)   COPD exacerbation History of exacerbation in the past. Unsure of etiology of this exacerbation. She does have seasonal allergies. Mostly upper airway wheezing on exam, though which may be related to GERD or vocal cord issue. Allergies also a consideration. No recent surgeries. -Escalate back to Solumedrol IV -Continue Doxycycline -Continue Duonebs; increase frequency to Duonebs q4 hours while awake -Start Atarax 25 mg TID to help with anxiety component -Flutter valve and incentive spirometer  Acute respiratory failure with hypoxia Secondary to above -Wean to room air as able -Mobilize as able  Demand ischemia No chest pain. Mildly elevated and downtrending. Transthoracic Echocardiogram ordered and without wall-motion abnormalities or ventricular dysfunction.  Pericardial effusion Mild. Asymptomatic.  Depression -Continue citalopram  GERD -Continue famotidine  Diabetes mellitus, type 2 Patient is not on medications as an outpatient. Last hemoglobin A1C of 6.5 which is well controlled. -Diet control recommended  History of anemia Not currently anemic. On iron supplementation -Continue iron supplementation  Primary hypertension -Continue  metoprolol  Hyperlipidemia -Continue simvastatin  Insomnia -Continue trazodone  Obesity Body mass index is 36.31 kg/m.   DVT prophylaxis: Lovenox Code Status:   Code Status: Full Code. Code status discussed at bedside. Patient is considering DNR but family against this decision. Further family discussions anticipated. Family Communication: Husband at bedside Disposition Plan: Discharge home likely in 1-3 days pending improvement of dyspnea, return to functional baseline. PT recommending [place]   Consultants:   None  Procedures:   None  Antimicrobials:  Doxycycline    Subjective: Dyspnea with persistent cough. She was doing better yesterday but worsened last night with some relief later on. This morning with significant cough and dyspnea.  Objective: Vitals:   04/24/20 1355 04/24/20 1734 04/24/20 2047 04/25/20 0447  BP:  (!) 134/57 (!) 129/91 129/63  Pulse: 75 77 98 75  Resp: 20 20 20 18   Temp:  97.7 F (36.5 C)  (!) 97.5 F (36.4 C)  TempSrc:  Oral  Oral  SpO2: 97% 98% 97% 98%  Weight:      Height:        Intake/Output Summary (Last 24 hours) at 04/25/2020 0843 Last data filed at 04/25/2020 0500 Gross per 24 hour  Intake 300 ml  Output 700 ml  Net -400 ml   Filed Weights   04/22/20 1231  Weight: 93 kg    Examination:  General exam: Appears uncomfortable secondary to persistent coughing. Otherwise conversant. Respiratory: Significant wheezing which appears to be transmitted from upper airway. Respiratory effort normal with no intercostal retractions or use of accessory muscles. Severe coughing fits. Cardiovascular: S1 & S2 heard, RRR. No significant edema Gastrointestinal: Abdomen is nondistended, soft and nontender. No masses felt. Normal bowel sounds heard Neurologic: No focal neurological deficits Musculoskeletal: No calf tenderness Skin: No cyanosis.  No new rashes Psychiatry: Alert and oriented. Memory intact. Mood & affect appropriate    Data  Reviewed: I have personally reviewed following labs and imaging studies  CBC Lab Results  Component Value Date   WBC 12.1 (H) 04/24/2020   RBC 3.69 (L) 04/24/2020   HGB 12.0 04/24/2020   HCT 35.5 (L) 04/24/2020   MCV 96.2 04/24/2020   MCH 32.5 04/24/2020   PLT 202 04/24/2020   MCHC 33.8 04/24/2020   RDW 13.0 04/24/2020   LYMPHSABS 5.2 (H) 04/22/2020   MONOABS 0.8 04/22/2020   EOSABS 0.1 04/22/2020   BASOSABS 0.1 21/30/8657     Last metabolic panel Lab Results  Component Value Date   NA 137 04/22/2020   K 4.9 04/22/2020   CL 104 04/22/2020   CO2 23 04/22/2020   BUN 23 04/22/2020   CREATININE 1.06 (H) 04/22/2020   GLUCOSE 160 (H) 04/22/2020   GFRNONAA 52 (L) 04/22/2020   GFRAA 67 01/31/2020   CALCIUM 8.9 04/22/2020   PHOS 2.8 06/16/2016   PROT 6.4 (L) 04/22/2020   ALBUMIN 3.7 04/22/2020   BILITOT 1.1 04/22/2020   ALKPHOS 52 04/22/2020   AST 51 (H) 04/22/2020   ALT 44 04/22/2020   ANIONGAP 10 04/22/2020    CBG (last 3)  Recent Labs    04/24/20 1139 04/24/20 1636 04/24/20 2145  GLUCAP 144* 230* 119*     GFR: Estimated Creatinine Clearance: 44.3 mL/min (A) (by C-G formula based on SCr of 1.06 mg/dL (H)).  Coagulation Profile: Recent Labs  Lab 04/22/20 1241  INR 1.0    Recent Results (from the past 240 hour(s))  Blood culture (routine x 2)     Status: None (Preliminary result)   Collection Time: 04/22/20 12:41 PM   Specimen: BLOOD  Result Value Ref Range Status   Specimen Description BLOOD  RIGHT WRIST  Final   Special Requests   Final    BOTTLES DRAWN AEROBIC AND ANAEROBIC Blood Culture results may not be optimal due to an inadequate volume of blood received in culture bottles   Culture   Final    NO GROWTH 3 DAYS Performed at [facility], [address] [place], [place] [address]    Report Status PENDING  Incomplete  Resp Panel by RT-PCR (Flu A&B, Covid) Nasopharyngeal Swab     Status: None   Collection Time: 04/22/20 12:47 PM    Specimen: Nasopharyngeal Swab; Nasopharyngeal(NP) swabs in vial transport medium  Result Value Ref Range Status   SARS Coronavirus 2 by RT PCR NEGATIVE NEGATIVE Final    Comment: (NOTE) SARS-CoV-2 target nucleic acids are NOT DETECTED.  The SARS-CoV-2 RNA is generally detectable in upper respiratory specimens during the acute phase of infection. The lowest concentration of SARS-CoV-2 viral copies this assay can detect is 138 copies/mL. A negative result does not preclude SARS-Cov-2 infection and should not be used as the sole basis for treatment or other patient management decisions. A negative result may occur with  improper specimen collection/handling, submission of specimen other than nasopharyngeal swab, presence of viral mutation(s) within the areas targeted by this assay, and inadequate number of viral copies(<138 copies/mL). A negative result must be combined with clinical observations, patient history, and epidemiological information. The expected result is Negative.  Fact Sheet for Patients:  EntrepreneurPulse.com.au  Fact Sheet for Healthcare Providers:  IncredibleEmployment.be  This test is no t yet approved or cleared by the [place] FDA and  has been authorized for detection and/or diagnosis of SARS-CoV-2  by FDA under an Emergency Use Authorization (EUA). This EUA will remain  in effect (meaning this test can be used) for the duration of the COVID-19 declaration under Section 564(b)(1) of the Act, 21 U.S.C.section 360bbb-3(b)(1), unless the authorization is terminated  or revoked sooner.       Influenza A by PCR NEGATIVE NEGATIVE Final   Influenza B by PCR NEGATIVE NEGATIVE Final    Comment: (NOTE) The Xpert Xpress SARS-CoV-2/FLU/RSV plus assay is intended as an aid in the diagnosis of influenza from Nasopharyngeal swab specimens and should not be used as a sole basis for treatment. Nasal washings and aspirates are  unacceptable for Xpert Xpress SARS-CoV-2/FLU/RSV testing.  Fact Sheet for Patients: EntrepreneurPulse.com.au  Fact Sheet for Healthcare Providers: IncredibleEmployment.be  This test is not yet approved or cleared by the [place] FDA and has been authorized for detection and/or diagnosis of SARS-CoV-2 by FDA under an Emergency Use Authorization (EUA). This EUA will remain in effect (meaning this test can be used) for the duration of the COVID-19 declaration under Section 564(b)(1) of the Act, 21 U.S.C. section 360bbb-3(b)(1), unless the authorization is terminated or revoked.  Performed at [facility], [address] [place], [place] [address]   Blood culture (routine x 2)     Status: None (Preliminary result)   Collection Time: 04/22/20  1:06 PM   Specimen: BLOOD  Result Value Ref Range Status   Specimen Description BLOOD  RIGHT ARM  Final   Special Requests   Final    BOTTLES DRAWN AEROBIC AND ANAEROBIC Blood Culture adequate volume   Culture   Final    NO GROWTH 3 DAYS Performed at [facility], [address] [place], [place] [address]    Report Status PENDING  Incomplete  Culture, sputum-assessment     Status: None   Collection Time: 04/22/20  9:48 PM   Specimen: Sputum  Result Value Ref Range Status   Specimen Description SPUTUM  Final   Special Requests NONE  Final   Sputum evaluation   Final    Sputum specimen not acceptable for testing.  Please recollect.   C/MARY ANN RIBAY PAGDATOON AT 2355 04/23/20.PMF Performed at [facility], [address] [place], [place] [address]    Report Status 04/23/2020 FINAL  Final  Respiratory (~20 pathogens) panel by PCR     Status: None   Collection Time: 04/23/20  2:35 PM   Specimen: Nasopharyngeal Swab; Respiratory  Result Value Ref Range Status   Adenovirus NOT DETECTED NOT DETECTED Final   Coronavirus 229E NOT DETECTED NOT DETECTED Final    Comment:  (NOTE) The Coronavirus on the Respiratory Panel, DOES NOT test for the novel  Coronavirus (2019 nCoV)    Coronavirus HKU1 NOT DETECTED NOT DETECTED Final   Coronavirus NL63 NOT DETECTED NOT DETECTED Final   Coronavirus OC43 NOT DETECTED NOT DETECTED Final   Metapneumovirus NOT DETECTED NOT DETECTED Final   Rhinovirus / Enterovirus NOT DETECTED NOT DETECTED Final   Influenza A NOT DETECTED NOT DETECTED Final   Influenza B NOT DETECTED NOT DETECTED Final   Parainfluenza Virus 1 NOT DETECTED NOT DETECTED Final   Parainfluenza Virus 2 NOT DETECTED NOT DETECTED Final   Parainfluenza Virus 3 NOT DETECTED NOT DETECTED Final   Parainfluenza Virus 4 NOT DETECTED NOT DETECTED Final   Respiratory Syncytial Virus NOT DETECTED NOT DETECTED Final   Bordetella pertussis NOT DETECTED NOT DETECTED Final   Bordetella Parapertussis NOT DETECTED NOT DETECTED Final  Chlamydophila pneumoniae NOT DETECTED NOT DETECTED Final   Mycoplasma pneumoniae NOT DETECTED NOT DETECTED Final    Comment: Performed at [facility], [place] [address] [place], [place] [address]        Radiology Studies: ECHOCARDIOGRAM COMPLETE  Result Date: 04/24/2020    ECHOCARDIOGRAM REPORT   Patient Name:   ADAYA GARMANY Wisner Date of Exam: 04/24/2020 Medical Rec #:  [id number]        Height:       63.0 in Accession #:    [id number]       Weight:       205.0 lb Date of Birth:  Jul 01, 1935         BSA:          1.954 m Patient Age:    64 years         BP:           143/81 mmHg Patient Gender: F                HR:           83 bpm. Exam Location:  [facility] Procedure: 2D Echo, Color Doppler, Cardiac Doppler and Intracardiac            Opacification Agent Indications:     Elevated troponin  History:         Patient has no prior history of Echocardiogram examinations.                  COPD; Risk Factors:Sleep Apnea, Diabetes and Dyslipidemia.  Sonographer:     Charmayne Sheer RDCS (AE) Referring Phys:  [id number] Annice Needy ECKSTAT Diagnosing Phys: Kathlyn Sacramento MD  Sonographer Comments: Suboptimal apical window and no subcostal window. IMPRESSIONS  1. Left ventricular ejection fraction, by estimation, is 60 to 65%. The left ventricle has normal function. Left ventricular endocardial border not optimally defined to evaluate regional wall motion. Left ventricular diastolic parameters were normal.  2. Right ventricular systolic function is normal. The right ventricular size is normal. Tricuspid regurgitation signal is inadequate for assessing PA pressure.  3. Left atrial size was mildly dilated.  4. A small pericardial effusion is present. The pericardial effusion is posterior to the left ventricle.  5. The mitral valve is normal in structure. No evidence of mitral valve regurgitation. No evidence of mitral stenosis.  6. The aortic valve is normal in structure. Aortic valve regurgitation is not visualized. No aortic stenosis is present.  7. The inferior vena cava is dilated in size with >50% respiratory variability, suggesting right atrial pressure of 8 mmHg. FINDINGS  Left Ventricle: Left ventricular ejection fraction, by estimation, is 60 to 65%. The left ventricle has normal function. Left ventricular endocardial border not optimally defined to evaluate regional wall motion. Definity contrast agent was given IV to delineate the left ventricular endocardial borders. The left ventricular internal cavity size was normal in size. There is no left ventricular hypertrophy. Left ventricular diastolic parameters were normal. Right Ventricle: The right ventricular size is normal. No increase in right ventricular wall thickness. Right ventricular systolic function is normal. Tricuspid regurgitation signal is inadequate for assessing PA pressure. Left Atrium: Left atrial size was mildly dilated. Right Atrium: Right atrial size was normal in size. Pericardium: A small pericardial effusion is present. The pericardial effusion is posterior to the left ventricle. Mitral Valve: The  mitral valve is normal in structure. No evidence of mitral valve regurgitation. No evidence of mitral valve stenosis. MV peak  gradient, 3.8 mmHg. The mean mitral valve gradient is 2.0 mmHg. Tricuspid Valve: The tricuspid valve is normal in structure. Tricuspid valve regurgitation is trivial. No evidence of tricuspid stenosis. Aortic Valve: The aortic valve is normal in structure. Aortic valve regurgitation is not visualized. No aortic stenosis is present. Aortic valve mean gradient measures 5.0 mmHg. Aortic valve peak gradient measures 9.2 mmHg. Aortic valve area, by VTI measures 2.67 cm. Pulmonic Valve: The pulmonic valve was normal in structure. Pulmonic valve regurgitation is not visualized. No evidence of pulmonic stenosis. Aorta: The aortic root is normal in size and structure. Venous: The inferior vena cava is dilated in size with greater than 50% respiratory variability, suggesting right atrial pressure of 8 mmHg. IAS/Shunts: No atrial level shunt detected by color flow Doppler.  LEFT VENTRICLE PLAX 2D LVIDd:         4.80 cm  Diastology LVIDs:         3.00 cm  LV e' medial:    7.94 cm/s LV PW:         1.50 cm  LV E/e' medial:  10.7 LV IVS:        0.60 cm  LV e' lateral:   10.80 cm/s LVOT diam:     2.10 cm  LV E/e' lateral: 7.9 LV SV:         68 LV SV Index:   35 LVOT Area:     3.46 cm  LEFT ATRIUM             Index LA diam:        4.60 cm 2.35 cm/m LA Vol (A2C):   39.6 ml 20.27 ml/m LA Vol (A4C):   55.8 ml 28.56 ml/m LA Biplane Vol: 52.4 ml 26.82 ml/m  AORTIC VALVE                   PULMONIC VALVE AV Area (Vmax):    2.44 cm    PV Vmax:       1.02 m/s AV Area (Vmean):   2.49 cm    PV Vmean:      65.400 cm/s AV Area (VTI):     2.67 cm    PV VTI:        0.192 m AV Vmax:           152.00 cm/s PV Peak grad:  4.2 mmHg AV Vmean:          99.800 cm/s PV Mean grad:  2.0 mmHg AV VTI:            0.256 m AV Peak Grad:      9.2 mmHg AV Mean Grad:      5.0 mmHg LVOT Vmax:         107.00 cm/s LVOT Vmean:         71.800 cm/s LVOT VTI:          0.197 m LVOT/AV VTI ratio: 0.77  AORTA Ao Root diam: 2.50 cm MITRAL VALVE MV Area (PHT): 4.04 cm    SHUNTS MV Area VTI:   2.93 cm    Systemic VTI:  0.20 m MV Peak grad:  3.8 mmHg    Systemic Diam: 2.10 cm MV Mean grad:  2.0 mmHg MV Vmax:       0.98 m/s MV Vmean:      60.7 cm/s MV Decel Time: 188 msec MV E velocity: 85.30 cm/s MV A velocity: 57.80 cm/s MV E/A ratio:  1.48 Kathlyn Sacramento MD Electronically signed by Rogue Jury  Arida MD Signature Date/Time: 04/24/2020/12:13:14 PM    Final         Scheduled Meds: . vitamin C  500 mg Oral Daily  . cholecalciferol  1,000 Units Oral Daily  . citalopram  40 mg Oral Daily  . doxycycline  100 mg Oral Q12H  . enoxaparin (LOVENOX) injection  50 mg Subcutaneous Q24H  . famotidine  20 mg Oral QHS  . ferrous sulfate  325 mg Oral QHS  . furosemide  20 mg Oral Daily  . guaiFENesin  1,200 mg Oral BID  . insulin aspart  0-15 Units Subcutaneous TID WC  . insulin aspart  0-5 Units Subcutaneous QHS  . ipratropium-albuterol  3 mL Nebulization Once  . ipratropium-albuterol  3 mL Nebulization Q4H while awake  . methylPREDNISolone (SOLU-MEDROL) injection  40 mg Intravenous Daily  . metoprolol succinate  25 mg Oral Daily  . multivitamin with minerals  1 tablet Oral Q lunch  . omega-3 acid ethyl esters  1 g Oral Daily  . pantoprazole  40 mg Oral Daily  . simvastatin  40 mg Oral Daily  . sodium chloride flush  3 mL Intravenous Q12H  . traZODone  50 mg Oral QHS  . vitamin B-12  1,000 mcg Oral Daily   Continuous Infusions:   LOS: 3 days     Cordelia Poche, MD [facility] 04/25/2020, 8:43 AM  If 7PM-7AM, please contact night-coverage [email or web address]

## 2020-04-25 NOTE — TOC Progression Note (Addendum)
Transition of Care [facility]) - Progression Note    Patient Details  Name: Jenna [name] MRN: [id number] Date of Birth: 03-01-1935  [facility]) CM/SW [place], LCSW Phone Number: 04/25/2020, 9:42 AM  Clinical Narrative:  [facility] and [facility] unable to accept home health referral. [facility] does not have nursing availability. Faxed referral to [facility] to review.   1:41 pm: [facility] unable to accept referral. Faxed referral to [place] [facility] to review for PT and OT.  Expected Discharge Plan: [place] Barriers to Discharge: Continued Medical Work up  Expected Discharge Plan and Services Expected Discharge Plan: [place] Choice: [place] arrangements for the past 2 months: Single Family Home                                       Social Determinants of Health (SDOH) Interventions    Readmission Risk Interventions No flowsheet data found.

## 2020-04-26 ENCOUNTER — Inpatient Hospital Stay: Payer: Medicare Other

## 2020-04-26 DIAGNOSIS — J441 Chronic obstructive pulmonary disease with (acute) exacerbation: Secondary | ICD-10-CM | POA: Diagnosis not present

## 2020-04-26 LAB — GLUCOSE, CAPILLARY
Glucose-Capillary: 99 mg/dL (ref 70–99)
Glucose-Capillary: 148 mg/dL — ABNORMAL HIGH (ref 70–99)
Glucose-Capillary: 280 mg/dL — ABNORMAL HIGH (ref 70–99)
Glucose-Capillary: 205 mg/dL — ABNORMAL HIGH (ref 70–99)

## 2020-04-26 MED ORDER — IPRATROPIUM-ALBUTEROL 0.5-2.5 (3) MG/3ML IN SOLN
3.0000 mL | Freq: Four times a day (QID) | RESPIRATORY_TRACT | Status: DC
  Administered 2020-04-27 – 2020-04-28 (×5): 3 mL via RESPIRATORY_TRACT
  Filled 2020-04-26 (×5): qty 3

## 2020-04-26 NOTE — Progress Notes (Signed)
Pt ambulated in hall on room air. Sats maintained to 95%. Sats 96-97% at rest with room air. Will continue to monitor for any additional needs.

## 2020-04-26 NOTE — TOC Progression Note (Signed)
Transition of Care [facility]) - Progression Note    Patient Details  Name: Girtrude Enslin MRN: [id number] Date of Birth: 17-Apr-1935  Transition of Care [facility]) CM/SW [place], LCSW Phone Number: 04/26/2020, 10:30 AM  Clinical Narrative:   [facility] unable to accept referral. Discussed outpatient therapy with patient and her husband. They will review clinic list and CSW will follow up later today regarding preference.  Expected Discharge Plan: [place] Barriers to Discharge: Continued Medical Work up  Expected Discharge Plan and Services Expected Discharge Plan: [place] Choice: [place] arrangements for the past 2 months: Single Family Home                                       Social Determinants of Health (SDOH) Interventions    Readmission Risk Interventions No flowsheet data found.

## 2020-04-26 NOTE — Progress Notes (Signed)
PROGRESS NOTE    Jenna [name]  ZYS:[id number] DOB: July 04, 1935 DOA: 04/22/2020 PCP: Olin Hauser, DO   Brief Narrative: Taken from prior notes Jenna [name] is a 85 y.o. female with a history of COPD, diabetes, gastric bypass, obesity. Patient presented secondary to shortness of breath and found to have a COPD exacerbation. Steroids and Duonebs started.  Subjective: Patient continued to have dyspnea and cough.  Saturating well on room air.  Having hoarseness of her voice for the past week.  Assessment & Plan:   Principal Problem:   COPD exacerbation ([place]) Active Problems:   Obesity (BMI 35.0-39.9 without comorbidity)   Depression, major, recurrent (HCC)   Centrilobular emphysema (HCC)   Controlled type 2 diabetes mellitus with diabetic nephropathy, without long-term current use of insulin (HCC)   PSVT (paroxysmal supraventricular tachycardia) (HCC)   Gastric bypass status for obesity  Acute hypoxic respiratory failure with COPD exacerbation.  Patient was weaned off from oxygen, able to ambulate well without any significant desaturation.  Continue to have most of coughing and hoarseness. On exam her cough was concerning for stridor. CT scanning of neck soft tissue was obtained and it was without any significant abnormality. -Will get benefit with outpatient ENT evaluation. -Continue Solu-Medrol -Continue doxycycline -Continue DuoNeb -Continue incentive spirometry and flutter valve. -Continue supportive care  Elevated troponin.  No chest pain.  Echocardiogram without any wall motion abnormalities. Most likely secondary to demand ischemia with COPD exacerbation.  Next  Type 2 diabetes mellitus.  Diet controlled with A1c of 6.5 -Continue to monitor  History of depression.  No acute concern. -Continue citalopram.  GERD. -Continue home dose of famotidine  History of iron deficiency anemia. Hemoglobin currently within normal limit. -Continue home iron  supplement.  Essential hypertension.  Blood pressure within goal. -Continue home dose of metoprolol  Hyperlipidemia -Continue simvastatin  Insomnia -Continue trazodone  Obesity Body mass index is 36.31 kg/m.   Objective: Vitals:   04/26/20 1130 04/26/20 1200 04/26/20 1617 04/26/20 1621  BP:  (!) 111/38 (!) 139/42   Pulse:  75 79   Resp:  20 20   Temp:  98.6 F (37 C) 98.2 F (36.8 C)   TempSrc:  Oral Oral   SpO2: 93%   93%  Weight:      Height:        Intake/Output Summary (Last 24 hours) at 04/26/2020 1738 Last data filed at 04/25/2020 1850 Gross per 24 hour  Intake 240 ml  Output --  Net 240 ml   Filed Weights   04/22/20 1231  Weight: 93 kg    Examination:  General exam: Appears calm and comfortable  Respiratory system: Clear to auscultation. Respiratory effort normal. Cardiovascular system: S1 & S2 heard, RRR. No JVD, murmurs, rubs, gallops or clicks. Gastrointestinal system: Soft, nontender, nondistended, bowel sounds positive. Central nervous system: Alert and oriented. No focal neurological deficits.Symmetric 5 x 5 power. Extremities: Trace LE edema, no cyanosis, pulses intact and symmetrical. Psychiatry: Judgement and insight appear normal. Mood & affect appropriate.    DVT prophylaxis: Lovenox Code Status: Full Family Communication: Husband was updated at bedside  Disposition Plan:  Status is: Inpatient  Remains inpatient appropriate because:Inpatient level of care appropriate due to severity of illness   Dispo: The patient is from: Home              Anticipated d/c is to: Home              Patient currently is not  medically stable to d/c.   Difficult to place patient No              Level of care: Med-Surg  All the records are reviewed and case discussed with Care Management/Social Worker. Management plans discussed with the patient, nursing and they are in agreement.  Consultants:   None  Procedures:  Antimicrobials:   Doxycycline  Data Reviewed: I have personally reviewed following labs and imaging studies  CBC: Recent Labs  Lab 04/20/20 1352 04/22/20 1241 04/24/20 1123  WBC 7.7 13.2* 12.1*  NEUTROABS 5.1 7.0  --   HGB 11.6* 12.2 12.0  HCT 35.6* 36.9 35.5*  MCV 98.3 99.2 96.2  PLT 190 245 601   Basic Metabolic Panel: Recent Labs  Lab 04/20/20 1352 04/22/20 1241  NA 141 137  K 3.5 4.9  CL 105 104  CO2 25 23  GLUCOSE 159* 160*  BUN 17 23  CREATININE 0.90 1.06*  CALCIUM 8.6* 8.9   GFR: Estimated Creatinine Clearance: 44.3 mL/min (A) (by C-G formula based on SCr of 1.06 mg/dL (H)). Liver Function Tests: Recent Labs  Lab 04/22/20 1241  AST 51*  ALT 44  ALKPHOS 52  BILITOT 1.1  PROT 6.4*  ALBUMIN 3.7   No results for input(s): LIPASE, AMYLASE in the last 168 hours. No results for input(s): AMMONIA in the last 168 hours. Coagulation Profile: Recent Labs  Lab 04/22/20 1241  INR 1.0   Cardiac Enzymes: No results for input(s): CKTOTAL, CKMB, CKMBINDEX, TROPONINI in the last 168 hours. BNP (last 3 results) No results for input(s): PROBNP in the last 8760 hours. HbA1C: No results for input(s): HGBA1C in the last 72 hours. CBG: Recent Labs  Lab 04/25/20 1650 04/25/20 2021 04/26/20 0757 04/26/20 1141 04/26/20 1619  GLUCAP 254* 193* 99 148* 280*   Lipid Profile: No results for input(s): CHOL, HDL, LDLCALC, TRIG, CHOLHDL, LDLDIRECT in the last 72 hours. Thyroid Function Tests: No results for input(s): TSH, T4TOTAL, FREET4, T3FREE, THYROIDAB in the last 72 hours. Anemia Panel: No results for input(s): VITAMINB12, FOLATE, FERRITIN, TIBC, IRON, RETICCTPCT in the last 72 hours. Sepsis Labs: Recent Labs  Lab 04/22/20 1241 04/22/20 1421  PROCALCITON  --  <0.10  LATICACIDVEN 1.7  --     Recent Results (from the past 240 hour(s))  Blood culture (routine x 2)     Status: None (Preliminary result)   Collection Time: 04/22/20 12:41 PM   Specimen: BLOOD  Result Value  Ref Range Status   Specimen Description BLOOD  RIGHT WRIST  Final   Special Requests   Final    BOTTLES DRAWN AEROBIC AND ANAEROBIC Blood Culture results may not be optimal due to an inadequate volume of blood received in culture bottles   Culture   Final    NO GROWTH 4 DAYS Performed at [facility], [address] [place], [place] [address]    Report Status PENDING  Incomplete  Resp Panel by RT-PCR (Flu A&B, Covid) Nasopharyngeal Swab     Status: None   Collection Time: 04/22/20 12:47 PM   Specimen: Nasopharyngeal Swab; Nasopharyngeal(NP) swabs in vial transport medium  Result Value Ref Range Status   SARS Coronavirus 2 by RT PCR NEGATIVE NEGATIVE Final    Comment: (NOTE) SARS-CoV-2 target nucleic acids are NOT DETECTED.  The SARS-CoV-2 RNA is generally detectable in upper respiratory specimens during the acute phase of infection. The lowest concentration of SARS-CoV-2 viral copies this assay can detect is 138 copies/mL. A negative result  does not preclude SARS-Cov-2 infection and should not be used as the sole basis for treatment or other patient management decisions. A negative result may occur with  improper specimen collection/handling, submission of specimen other than nasopharyngeal swab, presence of viral mutation(s) within the areas targeted by this assay, and inadequate number of viral copies(<138 copies/mL). A negative result must be combined with clinical observations, patient history, and epidemiological information. The expected result is Negative.  Fact Sheet for Patients:  EntrepreneurPulse.com.au  Fact Sheet for Healthcare Providers:  IncredibleEmployment.be  This test is no t yet approved or cleared by the [place] FDA and  has been authorized for detection and/or diagnosis of SARS-CoV-2 by FDA under an Emergency Use Authorization (EUA). This EUA will remain  in effect (meaning this test can be used) for the  duration of the COVID-19 declaration under Section 564(b)(1) of the Act, 21 U.S.C.section 360bbb-3(b)(1), unless the authorization is terminated  or revoked sooner.       Influenza A by PCR NEGATIVE NEGATIVE Final   Influenza B by PCR NEGATIVE NEGATIVE Final    Comment: (NOTE) The Xpert Xpress SARS-CoV-2/FLU/RSV plus assay is intended as an aid in the diagnosis of influenza from Nasopharyngeal swab specimens and should not be used as a sole basis for treatment. Nasal washings and aspirates are unacceptable for Xpert Xpress SARS-CoV-2/FLU/RSV testing.  Fact Sheet for Patients: EntrepreneurPulse.com.au  Fact Sheet for Healthcare Providers: IncredibleEmployment.be  This test is not yet approved or cleared by the [place] FDA and has been authorized for detection and/or diagnosis of SARS-CoV-2 by FDA under an Emergency Use Authorization (EUA). This EUA will remain in effect (meaning this test can be used) for the duration of the COVID-19 declaration under Section 564(b)(1) of the Act, 21 U.S.C. section 360bbb-3(b)(1), unless the authorization is terminated or revoked.  Performed at [facility], [address] [place], [place] [address]   Blood culture (routine x 2)     Status: None (Preliminary result)   Collection Time: 04/22/20  1:06 PM   Specimen: BLOOD  Result Value Ref Range Status   Specimen Description BLOOD  RIGHT ARM  Final   Special Requests   Final    BOTTLES DRAWN AEROBIC AND ANAEROBIC Blood Culture adequate volume   Culture   Final    NO GROWTH 4 DAYS Performed at [facility], [address] [place], [place] [address]    Report Status PENDING  Incomplete  Culture, sputum-assessment     Status: None   Collection Time: 04/22/20  9:48 PM   Specimen: Sputum  Result Value Ref Range Status   Specimen Description SPUTUM  Final   Special Requests NONE  Final   Sputum evaluation   Final    Sputum  specimen not acceptable for testing.  Please recollect.   C/MARY ANN RIBAY PAGDATOON AT 1856 04/23/20.PMF Performed at [facility], [address] [place], [place] [address]    Report Status 04/23/2020 FINAL  Final  Respiratory (~20 pathogens) panel by PCR     Status: None   Collection Time: 04/23/20  2:35 PM   Specimen: Nasopharyngeal Swab; Respiratory  Result Value Ref Range Status   Adenovirus NOT DETECTED NOT DETECTED Final   Coronavirus 229E NOT DETECTED NOT DETECTED Final    Comment: (NOTE) The Coronavirus on the Respiratory Panel, DOES NOT test for the novel  Coronavirus (2019 nCoV)    Coronavirus HKU1 NOT DETECTED NOT DETECTED Final   Coronavirus NL63 NOT DETECTED NOT  DETECTED Final   Coronavirus OC43 NOT DETECTED NOT DETECTED Final   Metapneumovirus NOT DETECTED NOT DETECTED Final   Rhinovirus / Enterovirus NOT DETECTED NOT DETECTED Final   Influenza A NOT DETECTED NOT DETECTED Final   Influenza B NOT DETECTED NOT DETECTED Final   Parainfluenza Virus 1 NOT DETECTED NOT DETECTED Final   Parainfluenza Virus 2 NOT DETECTED NOT DETECTED Final   Parainfluenza Virus 3 NOT DETECTED NOT DETECTED Final   Parainfluenza Virus 4 NOT DETECTED NOT DETECTED Final   Respiratory Syncytial Virus NOT DETECTED NOT DETECTED Final   Bordetella pertussis NOT DETECTED NOT DETECTED Final   Bordetella Parapertussis NOT DETECTED NOT DETECTED Final   Chlamydophila pneumoniae NOT DETECTED NOT DETECTED Final   Mycoplasma pneumoniae NOT DETECTED NOT DETECTED Final    Comment: Performed at [facility], [place] [address] [place], [place] [address]     Radiology Studies: CT SOFT TISSUE NECK WO CONTRAST  Result Date: 04/26/2020 CLINICAL DATA:  Hoarseness.  COPD exacerbation. EXAM: CT NECK WITHOUT CONTRAST TECHNIQUE: Multidetector CT imaging of the neck was performed following the standard protocol without intravenous contrast. COMPARISON:  Maxillofacial CT 12/31/2013 FINDINGS: Pharynx and  larynx: No evidence of mass or swelling within limitations of mild motion artifact and noncontrast technique. Widely patent airway. No fluid collection or inflammatory changes in the parapharyngeal or retropharyngeal spaces. Salivary glands: No inflammation, mass, or stone. Thyroid: Unremarkable. Lymph nodes: No enlarged or suspicious lymph nodes in the neck. Vascular: Mild asymmetric atherosclerotic calcification at the left carotid bifurcation. Limited intracranial: Unremarkable. Visualized orbits: Bilateral cataract extraction. Mastoids and visualized paranasal sinuses: Clear. Skeleton: Mild cervical disc and facet degeneration. Upper chest: No apical lung consolidation or mass. Other: None. IMPRESSION: No acute abnormality identified in the neck. Electronically Signed   By: Logan Bores M.D.   On: 04/26/2020 13:29    Scheduled Meds: . vitamin C  500 mg Oral Daily  . benzonatate  200 mg Oral BID  . cholecalciferol  1,000 Units Oral Daily  . citalopram  40 mg Oral Daily  . doxycycline  100 mg Oral Q12H  . enoxaparin (LOVENOX) injection  50 mg Subcutaneous Q24H  . famotidine  20 mg Oral QHS  . ferrous sulfate  325 mg Oral QHS  . furosemide  20 mg Oral Daily  . guaiFENesin  1,200 mg Oral BID  . hydrOXYzine  25 mg Oral TID  . insulin aspart  0-15 Units Subcutaneous TID WC  . insulin aspart  0-5 Units Subcutaneous QHS  . ipratropium-albuterol  3 mL Nebulization Once  . ipratropium-albuterol  3 mL Nebulization Q4H WA  . methylPREDNISolone (SOLU-MEDROL) injection  40 mg Intravenous Daily  . metoprolol succinate  25 mg Oral Daily  . multivitamin with minerals  1 tablet Oral Q lunch  . omega-3 acid ethyl esters  1 g Oral Daily  . pantoprazole  40 mg Oral Daily  . simvastatin  40 mg Oral Daily  . sodium chloride flush  3 mL Intravenous Q12H  . traZODone  50 mg Oral QHS  . vitamin B-12  1,000 mcg Oral Daily   Continuous Infusions:   LOS: 4 days   Time spent: 40 minutes. More than 50% of the  time was spent in counseling/coordination of care  Lorella Nimrod, MD [facility]  If 7PM-7AM, please contact night-coverage [email or web address]  04/26/2020, 5:38 PM   This record has been created using Dragon voice recognition software. Errors have been sought and corrected,but may not always be  located. Such creation errors do not reflect on the standard of care.

## 2020-04-27 DIAGNOSIS — J441 Chronic obstructive pulmonary disease with (acute) exacerbation: Secondary | ICD-10-CM | POA: Diagnosis not present

## 2020-04-27 LAB — SEDIMENTATION RATE: Sed Rate: 14 mm/hr (ref 0–30)

## 2020-04-27 LAB — HEPATIC FUNCTION PANEL
ALT: 34 U/L (ref 0–44)
AST: 36 U/L (ref 15–41)
Albumin: 3.7 g/dL (ref 3.5–5.0)
Alkaline Phosphatase: 62 U/L (ref 38–126)
Bilirubin, Direct: 0.1 mg/dL (ref 0.0–0.2)
Indirect Bilirubin: 0.7 mg/dL (ref 0.3–0.9)
Total Bilirubin: 0.8 mg/dL (ref 0.3–1.2)
Total Protein: 6.5 g/dL (ref 6.5–8.1)

## 2020-04-27 LAB — CULTURE, BLOOD (ROUTINE X 2)
Culture: NO GROWTH
Culture: NO GROWTH
Special Requests: ADEQUATE

## 2020-04-27 LAB — C-REACTIVE PROTEIN: CRP: 0.6 mg/dL (ref <1.0)

## 2020-04-27 LAB — GLUCOSE, CAPILLARY
Glucose-Capillary: 105 mg/dL — ABNORMAL HIGH (ref 70–99)
Glucose-Capillary: 240 mg/dL — ABNORMAL HIGH (ref 70–99)
Glucose-Capillary: 227 mg/dL — ABNORMAL HIGH (ref 70–99)
Glucose-Capillary: 244 mg/dL — ABNORMAL HIGH (ref 70–99)

## 2020-04-27 MED ORDER — METHYLPREDNISOLONE SODIUM SUCC 40 MG IJ SOLR
40.0000 mg | Freq: Two times a day (BID) | INTRAMUSCULAR | Status: DC
  Administered 2020-04-27: 40 mg via INTRAVENOUS
  Filled 2020-04-27: qty 1

## 2020-04-27 MED ORDER — FUROSEMIDE 10 MG/ML IJ SOLN
40.0000 mg | Freq: Every day | INTRAMUSCULAR | Status: DC
  Administered 2020-04-27: 40 mg via INTRAVENOUS
  Filled 2020-04-27: qty 4

## 2020-04-27 MED ORDER — METOPROLOL SUCCINATE ER 25 MG PO TB24
12.5000 mg | ORAL_TABLET | Freq: Every day | ORAL | Status: DC
  Administered 2020-04-28: 12.5 mg via ORAL
  Filled 2020-04-27: qty 1

## 2020-04-27 MED ORDER — MAGNESIUM SULFATE 2 GM/50ML IV SOLN
2.0000 g | Freq: Once | INTRAVENOUS | Status: AC
  Administered 2020-04-27: 2 g via INTRAVENOUS
  Filled 2020-04-27: qty 50

## 2020-04-27 NOTE — Consult Note (Signed)
Pulmonary Medicine          Date: 04/27/2020,   MRN# [id number] Jenna [name] [birth year]     AdmissionWeight: 93 kg                 CurrentWeight: 93 kg   Referring physician: Dr Reesa Chew   CHIEF COMPLAINT:   Acute exacerbation of COPD with stridor.    HISTORY OF PRESENT ILLNESS   85 yo pleasant F with hx of CKD, Asthma and COPD overlap syndrome, GERD, dyslipidemia came in with worsening SOB which occurred on day of admission . She has chronic LE edema. She required BIPAP on arrival CT chest done with PE protocol negative for PE, there is chronic bronchitic phenotype of COPD with centrilobular emphysema and diffuse but mild congestion consistent with interstitial edema again of mild severity with concomitant Right hemidiaphragm elevation due to hepatomegaly. Generous PA diameter noted suggestive of pulmonary HTN likely due to CKD, COPD. BNP was elevated despite her obesity with BMI>36  TTE was unable to assess tricuspid regurgitation. She had 2:1 elevation of AST/ALT suggestive of possible EtOH assoiciated transaminitis. She had Asthma most her life with several flares requiring hospitalization.   PAST MEDICAL HISTORY   Past Medical History:  Diagnosis Date  . Anxiety   . Arthritis    neck, knees(before replacements)  . Asthma   . CKD (chronic kidney disease), stage III ([place])   . COPD (chronic obstructive pulmonary disease) ([place])   . Encounter for colonoscopy due to history of adenomatous colonic polyps   . GERD (gastroesophageal reflux disease)   . Hearing loss of both ears   . History of echocardiogram    a. 10/2013 Echo: EF 60-65%, no rwma, midlly dil LA. Rnl RV fxn.  . Hyperlipidemia   . Osteoporosis   . Palpitations   . PSVT (paroxysmal supraventricular tachycardia) ([place])    a. 05/2018 [facility]: 99 SVT runs. Fastest 218 x 4:30. Longest 4:38 w/ rate of 172.  . Sleep apnea    resolved with gastric bypass  . Syncope and collapse   . Urine incontinence       SURGICAL HISTORY   Past Surgical History:  Procedure Laterality Date  . BROW LIFT Bilateral 06/27/2015   Procedure: BLEPHAROPLASTY BILATERAL UPPER EYELIDS BILATERAL BLEPHAROTOSIS EYELIDS;  Surgeon: Karle Starch, MD;  Location: [place];  Service: Ophthalmology;  Laterality: Bilateral;  BILATERAL  . CATARACT EXTRACTION W/PHACO Right 02/13/2015   Procedure: CATARACT EXTRACTION PHACO AND INTRAOCULAR LENS PLACEMENT (IOC);  Surgeon: Ronnell Freshwater, MD;  Location: [place];  Service: Ophthalmology;  Laterality: Right;  . CATARACT EXTRACTION W/PHACO Left 03/22/2015   Procedure: CATARACT EXTRACTION PHACO AND INTRAOCULAR LENS PLACEMENT (IOC);  Surgeon: Ronnell Freshwater, MD;  Location: [place];  Service: Ophthalmology;  Laterality: Left;  TORIC  . CHOLECYSTECTOMY  1984  . COLECTOMY    . COSMETIC SURGERY  2012   tummy tuck and excess skin removal  . GASTRIC BYPASS  2010  . REPLACEMENT TOTAL KNEE BILATERAL  1998  . SHOULDER SURGERY  2009   left   . TONSILLECTOMY    . TOTAL ABDOMINAL HYSTERECTOMY  1979     FAMILY HISTORY   Family History  Problem Relation Age of Onset  . Heart attack Mother   . Hypertension Mother   . Diabetes type II Mother   . Pneumonia Father   . Skin cancer Father   . Diabetes type II Father   .  Colon cancer Sister   . Ovarian cancer Sister   . Heart attack Brother   . Heart attack Sister 53  . Hyperlipidemia Sister   . Hypertension Sister      SOCIAL HISTORY   Social History   Tobacco Use  . Smoking status: Former Smoker    Packs/day: 1.00    Years: 20.00    Pack years: 20.00    Types: Cigarettes    Quit date: 07/1981    Years since quitting: 38.7  . Smokeless tobacco: Former Network engineer Use Topics  . Alcohol use: Not Currently    Comment: past  . Drug use: No     MEDICATIONS    Home Medication:    Current Medication:  Current Facility-Administered Medications:  .  albuterol  (PROVENTIL) (2.5 MG/3ML) 0.083% nebulizer solution 2.5 mg, 2.5 mg, Nebulization, Q4H PRN, Athena Masse, MD, 2.5 mg at 04/27/20 0131 .  ascorbic acid (VITAMIN C) tablet 500 mg, 500 mg, Oral, Daily, Clarnce Flock, MD, 500 mg at 04/27/20 6168 .  benzonatate (TESSALON) capsule 200 mg, 200 mg, Oral, BID, Mariel Aloe, MD, 200 mg at 04/27/20 3729 .  cholecalciferol (VITAMIN D3) tablet 1,000 Units, 1,000 Units, Oral, Daily, Clarnce Flock, MD, 1,000 Units at 04/27/20 [phone] .  citalopram (CELEXA) tablet 40 mg, 40 mg, Oral, Daily, Clarnce Flock, MD, 40 mg at 04/27/20 0834 .  enoxaparin (LOVENOX) injection 50 mg, 50 mg, Subcutaneous, Q24H, Clarnce Flock, MD, 50 mg at 04/26/20 2143 .  famotidine (PEPCID) tablet 20 mg, 20 mg, Oral, QHS, Clarnce Flock, MD, 20 mg at 04/26/20 2145 .  ferrous sulfate tablet 325 mg, 325 mg, Oral, QHS, Clarnce Flock, MD, 325 mg at 04/27/20 1237 .  furosemide (LASIX) tablet 20 mg, 20 mg, Oral, Daily, Clarnce Flock, MD, 20 mg at 04/27/20 0834 .  guaiFENesin (MUCINEX) 12 hr tablet 1,200 mg, 1,200 mg, Oral, BID, Mariel Aloe, MD, 1,200 mg at 04/27/20 0835 .  HYDROcodone-homatropine (HYCODAN) 5-1.5 MG/5ML syrup 5 mL, 5 mL, Oral, Q4H PRN, Mariel Aloe, MD, 5 mL at 04/27/20 1237 .  hydrOXYzine (ATARAX/VISTARIL) tablet 25 mg, 25 mg, Oral, TID, Mariel Aloe, MD, 25 mg at 04/27/20 0834 .  insulin aspart (novoLOG) injection 0-15 Units, 0-15 Units, Subcutaneous, TID WC, Athena Masse, MD, 5 Units at 04/27/20 1236 .  insulin aspart (novoLOG) injection 0-5 Units, 0-5 Units, Subcutaneous, QHS, Athena Masse, MD, 2 Units at 04/26/20 2143 .  ipratropium-albuterol (DUONEB) 0.5-2.5 (3) MG/3ML nebulizer solution 3 mL, 3 mL, Nebulization, Once, Judd Gaudier V, MD .  ipratropium-albuterol (DUONEB) 0.5-2.5 (3) MG/3ML nebulizer solution 3 mL, 3 mL, Nebulization, QID, Amin, Sumayya, MD, 3 mL at 04/27/20 1200 .  methylPREDNISolone sodium succinate  (SOLU-MEDROL) 40 mg/mL injection 40 mg, 40 mg, Intravenous, Q12H, Amin, Sumayya, MD .  metoprolol succinate (TOPROL-XL) 24 hr tablet 25 mg, 25 mg, Oral, Daily, Clarnce Flock, MD, 25 mg at 04/27/20 0834 .  multivitamin with minerals tablet 1 tablet, 1 tablet, Oral, Q lunch, Clarnce Flock, MD, 1 tablet at 04/27/20 1237 .  omega-3 acid ethyl esters (LOVAZA) capsule 1 g, 1 g, Oral, Daily, Clarnce Flock, MD, 1 g at 04/27/20 [phone]  pantoprazole (PROTONIX) EC tablet 40 mg, 40 mg, Oral, Daily, Mariel Aloe, MD, 40 mg at 04/27/20 0833 .  simvastatin (ZOCOR) tablet 40 mg, 40 mg, Oral, Daily, Clarnce Flock, MD, 40 mg at 04/27/20 0837 .  sodium chloride flush (NS) 0.9 % injection 3 mL, 3 mL, Intravenous, Q12H, Clarnce Flock, MD, 3 mL at 04/27/20 0837 .  traZODone (DESYREL) tablet 50 mg, 50 mg, Oral, QHS, Clarnce Flock, MD, 50 mg at 04/26/20 2144 .  vitamin B-12 (CYANOCOBALAMIN) tablet 1,000 mcg, 1,000 mcg, Oral, Daily, Clarnce Flock, MD, 1,000 mcg at 04/27/20 9622    ALLERGIES   Azithromycin, Hydromorphone, Morphine, Nsaids, Oxycodone-acetaminophen, Codeine, Nabumetone, Oxycodone, Oxycodone-acetaminophen, and Promethazine hcl     REVIEW OF SYSTEMS    Review of Systems:  Gen:  Denies  fever, sweats, chills weigh loss  HEENT: Denies blurred vision, double vision, ear pain, eye pain, hearing loss, nose bleeds, sore throat Cardiac:  No dizziness, chest pain or heaviness, chest tightness,edema Resp:   Denies cough or sputum porduction, shortness of breath,wheezing, hemoptysis,  Gi: Denies swallowing difficulty, stomach pain, nausea or vomiting, diarrhea, constipation, bowel incontinence Gu:  Denies bladder incontinence, burning urine Ext:   Denies Joint pain, stiffness or swelling Skin: Denies  skin rash, easy bruising or bleeding or hives Endoc:  Denies polyuria, polydipsia , polyphagia or weight change Psych:   Denies depression, insomnia or hallucinations    Other:  All other systems negative   VS: BP (!) 130/57 (BP Location: Left Arm)   Pulse 61   Temp 98 F (36.7 C) (Oral)   Resp (!) 21   Ht 5' 3"  (1.6 m)   Wt 93 kg   SpO2 97%   BMI 36.31 kg/m      PHYSICAL EXAM    GENERAL:NAD, no fevers, chills, no weakness no fatigue HEAD: Normocephalic, atraumatic.  EYES: Pupils equal, round, reactive to light. Extraocular muscles intact. No scleral icterus.  MOUTH: Moist mucosal membrane. Dentition intact. No abscess noted.  EAR, NOSE, THROAT: Clear without exudates. No external lesions.  NECK: Supple. No thyromegaly. No nodules. No JVD.  PULMONARY: Diffuse coarse rhonchi right sided +wheezes CARDIOVASCULAR: S1 and S2. Regular rate and rhythm. No murmurs, rubs, or gallops. No edema. Pedal pulses 2+ bilaterally.  GASTROINTESTINAL: Soft, nontender, nondistended. No masses. Positive bowel sounds. No hepatosplenomegaly.  MUSCULOSKELETAL: No swelling, clubbing, or edema. Range of motion full in all extremities.  NEUROLOGIC: Cranial nerves II through XII are intact. No gross focal neurological deficits. Sensation intact. Reflexes intact.  SKIN: No ulceration, lesions, rashes, or cyanosis. Skin warm and dry. Turgor intact.  PSYCHIATRIC: Mood, affect within normal limits. The patient is awake, alert and oriented x 3. Insight, judgment intact.       IMAGING    CT SOFT TISSUE NECK WO CONTRAST  Result Date: 04/26/2020 CLINICAL DATA:  Hoarseness.  COPD exacerbation. EXAM: CT NECK WITHOUT CONTRAST TECHNIQUE: Multidetector CT imaging of the neck was performed following the standard protocol without intravenous contrast. COMPARISON:  Maxillofacial CT 12/31/2013 FINDINGS: Pharynx and larynx: No evidence of mass or swelling within limitations of mild motion artifact and noncontrast technique. Widely patent airway. No fluid collection or inflammatory changes in the parapharyngeal or retropharyngeal spaces. Salivary glands: No inflammation, mass, or stone.  Thyroid: Unremarkable. Lymph nodes: No enlarged or suspicious lymph nodes in the neck. Vascular: Mild asymmetric atherosclerotic calcification at the left carotid bifurcation. Limited intracranial: Unremarkable. Visualized orbits: Bilateral cataract extraction. Mastoids and visualized paranasal sinuses: Clear. Skeleton: Mild cervical disc and facet degeneration. Upper chest: No apical lung consolidation or mass. Other: None. IMPRESSION: No acute abnormality identified in the neck. Electronically Signed   By: Logan Bores M.D.   On: 04/26/2020 13:29  CT Angio Chest PE W and/or Wo Contrast  Result Date: 04/22/2020 CLINICAL DATA:  Respiratory distress. Pulmonary embolus suspected. Absent breath sounds. EXAM: CT ANGIOGRAPHY CHEST WITH CONTRAST TECHNIQUE: Multidetector CT imaging of the chest was performed using the standard protocol during bolus administration of intravenous contrast. Multiplanar CT image reconstructions and MIPs were obtained to evaluate the vascular anatomy. CONTRAST:  45m OMNIPAQUE IOHEXOL 350 MG/ML SOLN COMPARISON:  CT angio chest 06/17/2016 FINDINGS: Cardiovascular: Satisfactory opacification of the pulmonary arteries to the segmental level. No evidence of pulmonary embolism. Normal heart size. No significant pericardial effusion. The thoracic aorta is normal in caliber. Mild atherosclerotic plaque of the thoracic aorta. Mild left anterior descending coronary artery calcifications. Mediastinum/Nodes: No enlarged mediastinal, hilar, or axillary lymph nodes. Thyroid gland, trachea, and esophagus demonstrate no significant findings. Tiny hiatal hernia. Lungs/Pleura: Pulmonary micronodule within the right upper lobe (7:43). Left lower lobe calcified pulmonary micronodule (7:72). Several other scattered pulmonary micronodules. No pulmonary mass. No focal consolidation. No pleural effusion. No pneumothorax. Upper Abdomen: Gastric surgical changes noted. Status post cholecystectomy. Atherosclerotic  plaque. Musculoskeletal: No chest wall abnormality. No suspicious lytic or blastic osseous lesions. No acute displaced fracture. Review of the MIP images confirms the above findings. IMPRESSION: 1. No pulmonary embolus. 2. No acute intrathoracic abnormality. 3. Tiny hiatal hernia in a patient status post gastric surgery. Electronically Signed   By: MIven FinnM.D.   On: 04/22/2020 18:27   DG Chest Portable 1 View  Result Date: 04/22/2020 CLINICAL DATA:  Shortness of breath. EXAM: PORTABLE CHEST 1 VIEW COMPARISON:  April 20, 2020 FINDINGS: The cardiomediastinal silhouette is stable. No pneumothorax. No nodules or masses. No focal infiltrates. No overt edema. IMPRESSION: No active disease. Electronically Signed   By: DDorise BullionIII M.D   On: 04/22/2020 13:01   DG Chest Portable 1 View  Result Date: 04/20/2020 CLINICAL DATA:  Shortness of breath for 4 days. EXAM: PORTABLE CHEST 1 VIEW COMPARISON:  Single-view of the chest 09/24/2017. FINDINGS: Lung volumes are slightly lower than on the comparison study. Lungs are clear. Heart size is upper normal. No pneumothorax or pleural fluid. No acute or focal bony abnormality. IMPRESSION: No acute disease. Electronically Signed   By: TInge RiseM.D.   On: 04/20/2020 14:15   ECHOCARDIOGRAM COMPLETE  Result Date: 04/24/2020    ECHOCARDIOGRAM REPORT   Patient Name:   Jenna [name] Date of Exam: 04/24/2020 Medical Rec #:  [id number]       Height:       63.0 in Accession #:    [id number]      Weight:       205.0 lb Date of Birth:  5October 03, 1937        BSA:          1.954 m Patient Age:    89years         BP:           143/81 mmHg Patient Gender: F                HR:           83 bpm. Exam Location:  [facility] Procedure: 2D Echo, Color Doppler, Cardiac Doppler and Intracardiac            Opacification Agent Indications:     Elevated troponin  History:         Patient has no prior history of Echocardiogram examinations.  COPD; Risk Factors:Sleep  Apnea, Diabetes and Dyslipidemia.  Sonographer:     Charmayne Sheer RDCS (AE) Referring Phys:  [id number] Annice Needy ECKSTAT Diagnosing Phys: Kathlyn Sacramento MD  Sonographer Comments: Suboptimal apical window and no subcostal window. IMPRESSIONS  1. Left ventricular ejection fraction, by estimation, is 60 to 65%. The left ventricle has normal function. Left ventricular endocardial border not optimally defined to evaluate regional wall motion. Left ventricular diastolic parameters were normal.  2. Right ventricular systolic function is normal. The right ventricular size is normal. Tricuspid regurgitation signal is inadequate for assessing PA pressure.  3. Left atrial size was mildly dilated.  4. A small pericardial effusion is present. The pericardial effusion is posterior to the left ventricle.  5. The mitral valve is normal in structure. No evidence of mitral valve regurgitation. No evidence of mitral stenosis.  6. The aortic valve is normal in structure. Aortic valve regurgitation is not visualized. No aortic stenosis is present.  7. The inferior vena cava is dilated in size with >50% respiratory variability, suggesting right atrial pressure of 8 mmHg. FINDINGS  Left Ventricle: Left ventricular ejection fraction, by estimation, is 60 to 65%. The left ventricle has normal function. Left ventricular endocardial border not optimally defined to evaluate regional wall motion. Definity contrast agent was given IV to delineate the left ventricular endocardial borders. The left ventricular internal cavity size was normal in size. There is no left ventricular hypertrophy. Left ventricular diastolic parameters were normal. Right Ventricle: The right ventricular size is normal. No increase in right ventricular wall thickness. Right ventricular systolic function is normal. Tricuspid regurgitation signal is inadequate for assessing PA pressure. Left Atrium: Left atrial size was mildly dilated. Right Atrium: Right atrial size was normal  in size. Pericardium: A small pericardial effusion is present. The pericardial effusion is posterior to the left ventricle. Mitral Valve: The mitral valve is normal in structure. No evidence of mitral valve regurgitation. No evidence of mitral valve stenosis. MV peak gradient, 3.8 mmHg. The mean mitral valve gradient is 2.0 mmHg. Tricuspid Valve: The tricuspid valve is normal in structure. Tricuspid valve regurgitation is trivial. No evidence of tricuspid stenosis. Aortic Valve: The aortic valve is normal in structure. Aortic valve regurgitation is not visualized. No aortic stenosis is present. Aortic valve mean gradient measures 5.0 mmHg. Aortic valve peak gradient measures 9.2 mmHg. Aortic valve area, by VTI measures 2.67 cm. Pulmonic Valve: The pulmonic valve was normal in structure. Pulmonic valve regurgitation is not visualized. No evidence of pulmonic stenosis. Aorta: The aortic root is normal in size and structure. Venous: The inferior vena cava is dilated in size with greater than 50% respiratory variability, suggesting right atrial pressure of 8 mmHg. IAS/Shunts: No atrial level shunt detected by color flow Doppler.  LEFT VENTRICLE PLAX 2D LVIDd:         4.80 cm  Diastology LVIDs:         3.00 cm  LV e' medial:    7.94 cm/s LV PW:         1.50 cm  LV E/e' medial:  10.7 LV IVS:        0.60 cm  LV e' lateral:   10.80 cm/s LVOT diam:     2.10 cm  LV E/e' lateral: 7.9 LV SV:         68 LV SV Index:   35 LVOT Area:     3.46 cm  LEFT ATRIUM  Index LA diam:        4.60 cm 2.35 cm/m LA Vol (A2C):   39.6 ml 20.27 ml/m LA Vol (A4C):   55.8 ml 28.56 ml/m LA Biplane Vol: 52.4 ml 26.82 ml/m  AORTIC VALVE                   PULMONIC VALVE AV Area (Vmax):    2.44 cm    PV Vmax:       1.02 m/s AV Area (Vmean):   2.49 cm    PV Vmean:      65.400 cm/s AV Area (VTI):     2.67 cm    PV VTI:        0.192 m AV Vmax:           152.00 cm/s PV Peak grad:  4.2 mmHg AV Vmean:          99.800 cm/s PV Mean grad:  2.0  mmHg AV VTI:            0.256 m AV Peak Grad:      9.2 mmHg AV Mean Grad:      5.0 mmHg LVOT Vmax:         107.00 cm/s LVOT Vmean:        71.800 cm/s LVOT VTI:          0.197 m LVOT/AV VTI ratio: 0.77  AORTA Ao Root diam: 2.50 cm MITRAL VALVE MV Area (PHT): 4.04 cm    SHUNTS MV Area VTI:   2.93 cm    Systemic VTI:  0.20 m MV Peak grad:  3.8 mmHg    Systemic Diam: 2.10 cm MV Mean grad:  2.0 mmHg MV Vmax:       0.98 m/s MV Vmean:      60.7 cm/s MV Decel Time: 188 msec MV E velocity: 85.30 cm/s MV A velocity: 57.80 cm/s MV E/A ratio:  1.48 Kathlyn Sacramento MD Electronically signed by Kathlyn Sacramento MD Signature Date/Time: 04/24/2020/12:13:14 PM    Final       ASSESSMENT/PLAN   Moderate exacerbation of Asthma and COPD overlap syndrome   - reviewed CT chest consistent with AECOPD/ASthma   - patient needs prolonged steroid therapy with taper   - There is also congestion diffusely , I will increase lasix to 40 IV daily from 20  - will decrease metoprolol to 12.5 BID due to bronchospasm  - patient working with OT -will order hepatic function panel as this also contributes to third spacing of fluid and dyspnea.  If abnormal will need more evaluation.  -She has not previously been on O2 supplementally  - She is improved clinically from previous - will continue to follow  -RVP negative -she had findings consistent with autoimmune disease in past but workup was unrevealing, will order RF/ESR/CRP -resp cultures not acceptable for testing   Thank you for allowing me to participate in the care of this patient.   Patient/Family are satisfied with care plan and all questions have been answered.  This document was prepared using Dragon voice recognition software and may include unintentional dictation errors.     Ottie Glazier, M.D.  Division of [place]

## 2020-04-27 NOTE — Progress Notes (Signed)
Inpatient Diabetes Program Recommendations  AACE/ADA: New Consensus Statement on Inpatient Glycemic Control  Target Ranges:  Prepandial:   less than 140 mg/dL      Peak postprandial:   less than 180 mg/dL (1-2 hours)      Critically ill patients:  140 - 180 mg/dL   Results for [name], [name] "Jenna [name]" (MRN [id number]) as of 04/27/2020 13:34  Ref. Range 04/26/2020 07:57 04/26/2020 11:41 04/26/2020 16:19 04/26/2020 21:22 04/27/2020 07:40 04/27/2020 11:42  Glucose-Capillary Latest Ref Range: 70 - 99 mg/dL 99 148 (H) 280 (H) 205 (H) 105 (H) 240 (H)   Review of Glycemic Control  Diabetes history: DM2 Outpatient Diabetes medications: None; diet controlled Current orders for Inpatient glycemic control: Novolog 0-15 units TID with meals, Novolog 0-5 units QHS; Solumedrol 40 mg daily  Inpatient Diabetes Program Recommendations:    Insulin: If steroids are continued, please consider ordering Novolog 2 units TID with meals for meal coverage if patient eats at least 50% of meals.  Thanks, Barnie Alderman, RN, MSN, CDE Diabetes Coordinator Inpatient Diabetes Program [phone] (Team Pager from 8am to 5pm)

## 2020-04-27 NOTE — Progress Notes (Signed)
Physical Therapy Treatment Patient Details Name: Jenna [name] MRN: [id number] DOB: [birth year] Today's Date: 04/27/2020    History of Present Illness Pt admitted for obesity with complaints of SOB symptoms. History includes obesity, depression, COPD, DM, and gastric bypass.    PT Comments    Pt is making good progress towards goals with ability to ambulate further distances this session. Improved endurance and safety awareness with technique using RW. Agreeable to sit in recliner, purewick replaced. Will continue to progress as able.   Follow Up Recommendations  Outpatient PT     Equipment Recommendations  None recommended by PT    Recommendations for Other Services       Precautions / Restrictions Precautions Precautions: Fall Restrictions Weight Bearing Restrictions: No    Mobility  Bed Mobility Overal bed mobility: Needs Assistance Bed Mobility: Supine to Sit;Sit to Supine     Supine to sit: Min guard     General bed mobility comments: improved technique for bed mobility. Once seated at EOB, upright posture noted. All mobility performed on RA    Transfers Overall transfer level: Needs assistance Equipment used: Rolling walker (2 wheeled) Transfers: Sit to/from Stand Sit to Stand: Min guard         General transfer comment: shoes donned prior to transfer. Once standing, upright posture noted. Safe technique with RW  Ambulation/Gait Ambulation/Gait assistance: Min guard Gait Distance (Feet): 120 Feet Assistive device: Rolling walker (2 wheeled) Gait Pattern/deviations: Step-through pattern     General Gait Details: ambulated around room (preferred not to go into hallway). Reciprocal gait pattern demonstrated with great safety awareness. Agreeable to sit in recliner after session.   Stairs             Wheelchair Mobility    Modified Rankin (Stroke Patients Only)       Balance Overall balance assessment: Needs assistance;History of  Falls Sitting-balance support: Feet supported Sitting balance-Leahy Scale: Good     Standing balance support: Bilateral upper extremity supported Standing balance-Leahy Scale: Fair                              Cognition Arousal/Alertness: Awake/alert Behavior During Therapy: WFL for tasks assessed/performed Overall Cognitive Status: Within Functional Limits for tasks assessed                                        Exercises Other Exercises Other Exercises: Seated ther-ex performed on B LE including hip abd/add, hip add squeezes, quad sets, and SLRs. All ther-ex performed x 12 reps with min assist    General Comments        Pertinent Vitals/Pain Pain Assessment: No/denies pain    Home Living                      Prior Function            PT Goals (current goals can now be found in the care plan section) Acute Rehab PT Goals Patient Stated Goal: to go home PT Goal Formulation: With patient Time For Goal Achievement: 05/08/20 Potential to Achieve Goals: Good Progress towards PT goals: Progressing toward goals    Frequency    Min 2X/week      PT Plan Discharge plan needs to be updated    Co-evaluation  AM-PAC PT "6 Clicks" Mobility   Outcome Measure  Help needed turning from your back to your side while in a flat bed without using bedrails?: A Little Help needed moving from lying on your back to sitting on the side of a flat bed without using bedrails?: A Little Help needed moving to and from a bed to a chair (including a wheelchair)?: A Little Help needed standing up from a chair using your arms (e.g., wheelchair or bedside chair)?: A Little Help needed to walk in hospital room?: A Little Help needed climbing 3-5 steps with a railing? : A Little 6 Click Score: 18    End of Session Equipment Utilized During Treatment: Gait belt;Oxygen Activity Tolerance: Patient tolerated treatment well Patient left:  with family/visitor present;in chair;with chair alarm set Nurse Communication: Mobility status PT Visit Diagnosis: Unsteadiness on feet (R26.81);Muscle weakness (generalized) (M62.81);Repeated falls (R29.6);Difficulty in walking, not elsewhere classified (R26.2)     Time: 4707-6151 PT Time Calculation (min) (ACUTE ONLY): 23 min  Charges:  $Gait Training: 8-22 mins $Therapeutic Exercise: 8-22 mins                     Greggory Stallion, PT, DPT [phone]    Jenna [name] 04/27/2020, 4:03 PM

## 2020-04-27 NOTE — Progress Notes (Signed)
Occupational Therapy Treatment Patient Details Name: Laketha Leopard MRN: [id number] DOB: 04-27-1935 Today's Date: 04/27/2020    History of present illness Pt admitted for COPD excerbation. PMH includes obesity, depression, COPD, DM, and gastric bypass.   OT comments  Pt seen for OT treatment on this date. Upon arrival to room, pt awake and seated upright in recliner. Pt reporting no pain and agreeable to OT tx. Pt required SUPERVISION for standing grooming tasks, SUPERVISION for standing UB dressing, MIN GUARD for sit>stand LB bathing, and MIN GUARD for functional mobility of ~175feet with RW. Of note, SpO2 >92% and HR in 90s throughout session. Pt is making good progress toward goals and continues to benefit from skilled OT services to maximize return to PLOF and minimize risk of future falls, injury, caregiver burden, and readmission. Will continue to follow POC. Discharge recommendation remains appropriate.    Follow Up Recommendations  Home health OT;Supervision - Intermittent    Equipment Recommendations  None recommended by OT       Precautions / Restrictions Precautions Precautions: Fall Restrictions Weight Bearing Restrictions: No       Mobility Bed Mobility Overal bed mobility: Needs Assistance Bed Mobility: Supine to Sit;Sit to Supine     Supine to sit: Min guard     General bed mobility comments: Pt in recliner at beginning and end of session    Transfers Overall transfer level: Needs assistance Equipment used: Rolling walker (2 wheeled) Transfers: Sit to/from Stand Sit to Stand: Supervision         General transfer comment: shoes donned prior to transfer. Safe technique with RW    Balance Overall balance assessment: Needs assistance;History of Falls Sitting-balance support: No upper extremity supported;Feet supported Sitting balance-Leahy Scale: Good     Standing balance support: Bilateral upper extremity supported;During functional activity Standing  balance-Leahy Scale: Fair Standing balance comment: reliance on UE support                           ADL either performed or assessed with clinical judgement   ADL Overall ADL's : Needs assistance/impaired     Grooming: Wash/dry hands;Wash/dry face;Standing;Set up;Supervision/safety       Lower Body Bathing: Min guard;Sit to/from stand   Upper Body Dressing : Supervision/safety;Set up;Standing Upper Body Dressing Details (indicate cue type and reason): to don robe                 Functional mobility during ADLs: Min guard;Rolling walker                 Cognition Arousal/Alertness: Awake/alert Behavior During Therapy: WFL for tasks assessed/performed Overall Cognitive Status: Within Functional Limits for tasks assessed                                                General Comments SpO2 >92% and HR in 90s throughout session    Pertinent Vitals/ Pain       Pain Assessment: No/denies pain  Home Living   Prior Functioning/Environment    Frequency  Min 2X/week        Progress Toward Goals  OT Goals(current goals can now be found in the care plan section)  Progress towards OT goals: Progressing toward goals  Acute Rehab OT Goals Patient Stated Goal: to go home OT Goal Formulation: With patient  Time For Goal Achievement: 05/08/20 Potential to Achieve Goals: Good  Plan Discharge plan remains appropriate;Frequency remains appropriate       AM-PAC OT "6 Clicks" Daily Activity     Outcome Measure   Help from another person eating meals?: None Help from another person taking care of personal grooming?: A Little Help from another person toileting, which includes using toliet, bedpan, or urinal?: A Little Help from another person bathing (including washing, rinsing, drying)?: A Little Help from another person to put on and taking off regular upper body clothing?: A Little Help from another person to put on and taking off  regular lower body clothing?: A Little 6 Click Score: 19    End of Session Equipment Utilized During Treatment: Rolling walker  OT Visit Diagnosis: Unsteadiness on feet (R26.81);Muscle weakness (generalized) (M62.81)   Activity Tolerance Patient tolerated treatment well   Patient Left in chair;with call bell/phone within reach   Nurse Communication Mobility status        Time: 4854-6270 OT Time Calculation (min): 23 min  Charges: OT General Charges $OT Visit: 1 Visit OT Treatments $Self Care/Home Management : 23-37 mins   Fredirick Maudlin, [place]

## 2020-04-27 NOTE — Progress Notes (Signed)
PROGRESS NOTE    Jenna [name]  DUK:[id number] DOB: 11-13-1935 DOA: 04/22/2020 PCP: Olin Hauser, DO   Brief Narrative: Taken from prior notes Jenna [name] is a 85 y.o. female with a history of COPD, diabetes, gastric bypass, obesity. Patient presented secondary to shortness of breath and found to have a COPD exacerbation. Steroids and Duonebs started. Patient continued to have cough, hoarseness and become very short of breath intermittently. There was some concern of stridor, CT neck soft tissue was normal. Weaned off from oxygen now.  Subjective: Patient continued to have cough and hoarseness.  Per nursing staff she had an episode of wheezing and shortness of breath earlier today which resolved with breathing treatment.  Still become short of breath with coughing.  Coughing appears weak.Having thick mucous.  Assessment & Plan:   Principal Problem:   COPD exacerbation ([place]) Active Problems:   Obesity (BMI 35.0-39.9 without comorbidity)   Depression, major, recurrent (HCC)   Centrilobular emphysema (HCC)   Controlled type 2 diabetes mellitus with diabetic nephropathy, without long-term current use of insulin (HCC)   PSVT (paroxysmal supraventricular tachycardia) (HCC)   Gastric bypass status for obesity  Acute hypoxic respiratory failure with COPD exacerbation.  Patient was weaned off from oxygen, able to ambulate well without any significant desaturation.  Continue to have most of coughing and hoarseness, having intermittent SOB. On exam her cough was concerning for stridor, weak with hoarseness. CT scanning of neck soft tissue was obtained and it was without any significant abnormality. -Will get benefit with outpatient ENT evaluation, if hoarseness persists. -Continue Solu-Medrol- Increase to BID -Continue doxycycline -Continue DuoNeb -Continue incentive spirometry and flutter valve. -Continue supportive care -Pulmonary consult - Chest PT  Elevated troponin.   No chest pain.  Echocardiogram without any wall motion abnormalities. Most likely secondary to demand ischemia with COPD exacerbation.   Type 2 diabetes mellitus.  Diet controlled with A1c of 6.5 -Continue to monitor  History of depression.  No acute concern. -Continue citalopram.  GERD. -Continue home dose of famotidine  History of iron deficiency anemia. Hemoglobin currently within normal limit. -Continue home iron supplement.  Essential hypertension.  Blood pressure within goal. -Continue home dose of metoprolol  Hyperlipidemia -Continue simvastatin  Insomnia -Continue trazodone  Obesity Body mass index is 36.31 kg/m.   Objective: Vitals:   04/26/20 1940 04/26/20 2321 04/27/20 0438 04/27/20 0801  BP:  (!) 120/46 139/70 122/63  Pulse:  67 79 83  Resp:  16 16 (!) 21  Temp:  97.8 F (36.6 C) 98.4 F (36.9 C) 98.1 F (36.7 C)  TempSrc:  Oral Oral Oral  SpO2: 95% 95% 95% 97%  Weight:      Height:        Intake/Output Summary (Last 24 hours) at 04/27/2020 1519 Last data filed at 04/27/2020 1342 Gross per 24 hour  Intake 240 ml  Output 950 ml  Net -710 ml   Filed Weights   04/22/20 1231  Weight: 93 kg    Examination:  General. Well developed lady, In no acute distress. Pretty hoarse Pulmonary.  Lungs clear bilaterally, mildly increased work of breathing. CV.  Regular rate and rhythm, no JVD, rub or murmur. Abdomen.  Soft, nontender, nondistended, BS positive. CNS.  Alert and oriented x3.  No focal neurologic deficit. Extremities.  No edema, no cyanosis, pulses intact and symmetrical. Psychiatry.  Judgment and insight appears normal.   DVT prophylaxis: Lovenox Code Status: Full Family Communication: Husband and daughter was updated at  bedside  Disposition Plan:  Status is: Inpatient  Remains inpatient appropriate because:Inpatient level of care appropriate due to severity of illness   Dispo: The patient is from: Home              Anticipated d/c  is to: Home              Patient currently is not medically stable to d/c.   Difficult to place patient No              Level of care: Med-Surg  All the records are reviewed and case discussed with Care Management/Social Worker. Management plans discussed with the patient, nursing and they are in agreement.  Consultants:   Pulmonalogy  Procedures:  Antimicrobials:  Doxycycline  Data Reviewed: I have personally reviewed following labs and imaging studies  CBC: Recent Labs  Lab 04/22/20 1241 04/24/20 1123  WBC 13.2* 12.1*  NEUTROABS 7.0  --   HGB 12.2 12.0  HCT 36.9 35.5*  MCV 99.2 96.2  PLT 245 474   Basic Metabolic Panel: Recent Labs  Lab 04/22/20 1241  NA 137  K 4.9  CL 104  CO2 23  GLUCOSE 160*  BUN 23  CREATININE 1.06*  CALCIUM 8.9   GFR: Estimated Creatinine Clearance: 44.3 mL/min (A) (by C-G formula based on SCr of 1.06 mg/dL (H)). Liver Function Tests: Recent Labs  Lab 04/22/20 1241  AST 51*  ALT 44  ALKPHOS 52  BILITOT 1.1  PROT 6.4*  ALBUMIN 3.7   No results for input(s): LIPASE, AMYLASE in the last 168 hours. No results for input(s): AMMONIA in the last 168 hours. Coagulation Profile: Recent Labs  Lab 04/22/20 1241  INR 1.0   Cardiac Enzymes: No results for input(s): CKTOTAL, CKMB, CKMBINDEX, TROPONINI in the last 168 hours. BNP (last 3 results) No results for input(s): PROBNP in the last 8760 hours. HbA1C: No results for input(s): HGBA1C in the last 72 hours. CBG: Recent Labs  Lab 04/26/20 1141 04/26/20 1619 04/26/20 2122 04/27/20 0740 04/27/20 1142  GLUCAP 148* 280* 205* 105* 240*   Lipid Profile: No results for input(s): CHOL, HDL, LDLCALC, TRIG, CHOLHDL, LDLDIRECT in the last 72 hours. Thyroid Function Tests: No results for input(s): TSH, T4TOTAL, FREET4, T3FREE, THYROIDAB in the last 72 hours. Anemia Panel: No results for input(s): VITAMINB12, FOLATE, FERRITIN, TIBC, IRON, RETICCTPCT in the last 72 hours. Sepsis  Labs: Recent Labs  Lab 04/22/20 1241 04/22/20 1421  PROCALCITON  --  <0.10  LATICACIDVEN 1.7  --     Recent Results (from the past 240 hour(s))  Blood culture (routine x 2)     Status: None   Collection Time: 04/22/20 12:41 PM   Specimen: BLOOD  Result Value Ref Range Status   Specimen Description BLOOD  RIGHT WRIST  Final   Special Requests   Final    BOTTLES DRAWN AEROBIC AND ANAEROBIC Blood Culture results may not be optimal due to an inadequate volume of blood received in culture bottles   Culture   Final    NO GROWTH 5 DAYS Performed at [facility], [address], [place], [place] [address]    Report Status 04/27/2020 FINAL  Final  Resp Panel by RT-PCR (Flu A&B, Covid) Nasopharyngeal Swab     Status: None   Collection Time: 04/22/20 12:47 PM   Specimen: Nasopharyngeal Swab; Nasopharyngeal(NP) swabs in vial transport medium  Result Value Ref Range Status   SARS Coronavirus 2 by RT PCR NEGATIVE NEGATIVE Final  Comment: (NOTE) SARS-CoV-2 target nucleic acids are NOT DETECTED.  The SARS-CoV-2 RNA is generally detectable in upper respiratory specimens during the acute phase of infection. The lowest concentration of SARS-CoV-2 viral copies this assay can detect is 138 copies/mL. A negative result does not preclude SARS-Cov-2 infection and should not be used as the sole basis for treatment or other patient management decisions. A negative result may occur with  improper specimen collection/handling, submission of specimen other than nasopharyngeal swab, presence of viral mutation(s) within the areas targeted by this assay, and inadequate number of viral copies(<138 copies/mL). A negative result must be combined with clinical observations, patient history, and epidemiological information. The expected result is Negative.  Fact Sheet for Patients:  EntrepreneurPulse.com.au  Fact Sheet for Healthcare Providers:   IncredibleEmployment.be  This test is no t yet approved or cleared by the [place] FDA and  has been authorized for detection and/or diagnosis of SARS-CoV-2 by FDA under an Emergency Use Authorization (EUA). This EUA will remain  in effect (meaning this test can be used) for the duration of the COVID-19 declaration under Section 564(b)(1) of the Act, 21 U.S.C.section 360bbb-3(b)(1), unless the authorization is terminated  or revoked sooner.       Influenza A by PCR NEGATIVE NEGATIVE Final   Influenza B by PCR NEGATIVE NEGATIVE Final    Comment: (NOTE) The Xpert Xpress SARS-CoV-2/FLU/RSV plus assay is intended as an aid in the diagnosis of influenza from Nasopharyngeal swab specimens and should not be used as a sole basis for treatment. Nasal washings and aspirates are unacceptable for Xpert Xpress SARS-CoV-2/FLU/RSV testing.  Fact Sheet for Patients: EntrepreneurPulse.com.au  Fact Sheet for Healthcare Providers: IncredibleEmployment.be  This test is not yet approved or cleared by the [place] FDA and has been authorized for detection and/or diagnosis of SARS-CoV-2 by FDA under an Emergency Use Authorization (EUA). This EUA will remain in effect (meaning this test can be used) for the duration of the COVID-19 declaration under Section 564(b)(1) of the Act, 21 U.S.C. section 360bbb-3(b)(1), unless the authorization is terminated or revoked.  Performed at [facility], [address] [place], [place] [address]   Blood culture (routine x 2)     Status: None   Collection Time: 04/22/20  1:06 PM   Specimen: BLOOD  Result Value Ref Range Status   Specimen Description BLOOD  RIGHT ARM  Final   Special Requests   Final    BOTTLES DRAWN AEROBIC AND ANAEROBIC Blood Culture adequate volume   Culture   Final    NO GROWTH 5 DAYS Performed at [facility], [address] [place], [place] [address]     Report Status 04/27/2020 FINAL  Final  Culture, sputum-assessment     Status: None   Collection Time: 04/22/20  9:48 PM   Specimen: Sputum  Result Value Ref Range Status   Specimen Description SPUTUM  Final   Special Requests NONE  Final   Sputum evaluation   Final    Sputum specimen not acceptable for testing.  Please recollect.   C/MARY ANN RIBAY PAGDATOON AT 8250 04/23/20.PMF Performed at [facility], [address] [place],  [address]    Report Status 04/23/2020 FINAL  Final  Respiratory (~20 pathogens) panel by PCR     Status: None   Collection Time: 04/23/20  2:35 PM   Specimen: Nasopharyngeal Swab; Respiratory  Result Value Ref Range Status   Adenovirus NOT DETECTED NOT DETECTED Final   Coronavirus 229E NOT  DETECTED NOT DETECTED Final    Comment: (NOTE) The Coronavirus on the Respiratory Panel, DOES NOT test for the novel  Coronavirus (2019 nCoV)    Coronavirus HKU1 NOT DETECTED NOT DETECTED Final   Coronavirus NL63 NOT DETECTED NOT DETECTED Final   Coronavirus OC43 NOT DETECTED NOT DETECTED Final   Metapneumovirus NOT DETECTED NOT DETECTED Final   Rhinovirus / Enterovirus NOT DETECTED NOT DETECTED Final   Influenza A NOT DETECTED NOT DETECTED Final   Influenza B NOT DETECTED NOT DETECTED Final   Parainfluenza Virus 1 NOT DETECTED NOT DETECTED Final   Parainfluenza Virus 2 NOT DETECTED NOT DETECTED Final   Parainfluenza Virus 3 NOT DETECTED NOT DETECTED Final   Parainfluenza Virus 4 NOT DETECTED NOT DETECTED Final   Respiratory Syncytial Virus NOT DETECTED NOT DETECTED Final   Bordetella pertussis NOT DETECTED NOT DETECTED Final   Bordetella Parapertussis NOT DETECTED NOT DETECTED Final   Chlamydophila pneumoniae NOT DETECTED NOT DETECTED Final   Mycoplasma pneumoniae NOT DETECTED NOT DETECTED Final    Comment: Performed at [facility], [place] [address] [place], [place] [address]     Radiology Studies: CT SOFT TISSUE NECK WO  CONTRAST  Result Date: 04/26/2020 CLINICAL DATA:  Hoarseness.  COPD exacerbation. EXAM: CT NECK WITHOUT CONTRAST TECHNIQUE: Multidetector CT imaging of the neck was performed following the standard protocol without intravenous contrast. COMPARISON:  Maxillofacial CT 12/31/2013 FINDINGS: Pharynx and larynx: No evidence of mass or swelling within limitations of mild motion artifact and noncontrast technique. Widely patent airway. No fluid collection or inflammatory changes in the parapharyngeal or retropharyngeal spaces. Salivary glands: No inflammation, mass, or stone. Thyroid: Unremarkable. Lymph nodes: No enlarged or suspicious lymph nodes in the neck. Vascular: Mild asymmetric atherosclerotic calcification at the left carotid bifurcation. Limited intracranial: Unremarkable. Visualized orbits: Bilateral cataract extraction. Mastoids and visualized paranasal sinuses: Clear. Skeleton: Mild cervical disc and facet degeneration. Upper chest: No apical lung consolidation or mass. Other: None. IMPRESSION: No acute abnormality identified in the neck. Electronically Signed   By: Logan Bores M.D.   On: 04/26/2020 13:29    Scheduled Meds: . vitamin C  500 mg Oral Daily  . benzonatate  200 mg Oral BID  . cholecalciferol  1,000 Units Oral Daily  . citalopram  40 mg Oral Daily  . enoxaparin (LOVENOX) injection  50 mg Subcutaneous Q24H  . famotidine  20 mg Oral QHS  . ferrous sulfate  325 mg Oral QHS  . furosemide  20 mg Oral Daily  . guaiFENesin  1,200 mg Oral BID  . hydrOXYzine  25 mg Oral TID  . insulin aspart  0-15 Units Subcutaneous TID WC  . insulin aspart  0-5 Units Subcutaneous QHS  . ipratropium-albuterol  3 mL Nebulization Once  . ipratropium-albuterol  3 mL Nebulization QID  . methylPREDNISolone (SOLU-MEDROL) injection  40 mg Intravenous Daily  . metoprolol succinate  25 mg Oral Daily  . multivitamin with minerals  1 tablet Oral Q lunch  . omega-3 acid ethyl esters  1 g Oral Daily  .  pantoprazole  40 mg Oral Daily  . simvastatin  40 mg Oral Daily  . sodium chloride flush  3 mL Intravenous Q12H  . traZODone  50 mg Oral QHS  . vitamin B-12  1,000 mcg Oral Daily   Continuous Infusions:   LOS: 5 days   Time spent: 35 minutes. More than 50% of the time was spent in counseling/coordination of care  Lorella Nimrod, MD [facility]  If 7PM-7AM, please contact night-coverage [email or web address]  04/27/2020, 3:19 PM   This record has been created using Systems analyst. Errors have been sought and corrected,but may not always be located. Such creation errors do not reflect on the standard of care.

## 2020-04-27 NOTE — TOC Progression Note (Signed)
Transition of Care [facility]) - Progression Note    Patient Details  Name: Jenna [name] MRN: [id number] Date of Birth: Jun 23, 1935  [facility] CM/SW Contact  Candie Chroman, LCSW Phone Number: 04/27/2020, 11:57 AM  Clinical Narrative:   Patient and her husband have not decided on an outpatient therapy clinic yet. Will follow up tomorrow.  Expected Discharge Plan: [place] Barriers to Discharge: Continued Medical Work up  Expected Discharge Plan and Services Expected Discharge Plan: [place] Choice: [place] arrangements for the past 2 months: Single Family Home                                       Social Determinants of Health (SDOH) Interventions    Readmission Risk Interventions No flowsheet data found.

## 2020-04-28 DIAGNOSIS — J441 Chronic obstructive pulmonary disease with (acute) exacerbation: Secondary | ICD-10-CM | POA: Diagnosis not present

## 2020-04-28 LAB — GLUCOSE, CAPILLARY
Glucose-Capillary: 203 mg/dL — ABNORMAL HIGH (ref 70–99)
Glucose-Capillary: 106 mg/dL — ABNORMAL HIGH (ref 70–99)

## 2020-04-28 LAB — CBC
HCT: 38.3 % (ref 36.0–46.0)
Hemoglobin: 13.2 g/dL (ref 12.0–15.0)
MCH: 32.5 pg (ref 26.0–34.0)
MCHC: 34.5 g/dL (ref 30.0–36.0)
MCV: 94.3 fL (ref 80.0–100.0)
Platelets: 205 10*3/uL (ref 150–400)
RBC: 4.06 MIL/uL (ref 3.87–5.11)
RDW: 12.4 % (ref 11.5–15.5)
WBC: 10.8 10*3/uL — ABNORMAL HIGH (ref 4.0–10.5)
nRBC: 0 % (ref 0.0–0.2)

## 2020-04-28 LAB — BASIC METABOLIC PANEL
Anion gap: 13 (ref 5–15)
BUN: 44 mg/dL — ABNORMAL HIGH (ref 8–23)
CO2: 29 mmol/L (ref 22–32)
Calcium: 9.5 mg/dL (ref 8.9–10.3)
Chloride: 94 mmol/L — ABNORMAL LOW (ref 98–111)
Creatinine, Ser: 1.16 mg/dL — ABNORMAL HIGH (ref 0.44–1.00)
GFR, Estimated: 47 mL/min — ABNORMAL LOW (ref >60)
Glucose, Bld: 241 mg/dL — ABNORMAL HIGH (ref 70–99)
Potassium: 4.2 mmol/L (ref 3.5–5.1)
Sodium: 136 mmol/L (ref 135–145)

## 2020-04-28 LAB — RHEUMATOID FACTOR: Rheumatoid fact SerPl-aCnc: 10.5 IU/mL (ref <14.0)

## 2020-04-28 MED ORDER — FUROSEMIDE 10 MG/ML IJ SOLN: 20.0000 mg | Freq: Every day | INTRAMUSCULAR | Status: DC

## 2020-04-28 MED ORDER — PREDNISONE 20 MG PO TABS
50.0000 mg | ORAL_TABLET | Freq: Every day | ORAL | Status: DC
  Administered 2020-04-28: 50 mg via ORAL
  Filled 2020-04-28: qty 3

## 2020-04-28 MED ORDER — ALBUTEROL SULFATE HFA 108 (90 BASE) MCG/ACT IN AERS: 2.0000 | INHALATION_SPRAY | Freq: Four times a day (QID) | RESPIRATORY_TRACT | 2 refills | Status: DC | PRN

## 2020-04-28 MED ORDER — BREO ELLIPTA 100-25 MCG/INH IN AEPB: 1.0000 | INHALATION_SPRAY | Freq: Every day | RESPIRATORY_TRACT | 0 refills | Status: AC

## 2020-04-28 MED ORDER — PREDNISONE 5 MG PO TABS: ORAL_TABLET | ORAL | 0 refills | Status: DC

## 2020-04-28 MED ORDER — METHYLPREDNISOLONE SODIUM SUCC 40 MG IJ SOLR: 40.0000 mg | INTRAMUSCULAR | Status: DC

## 2020-04-28 MED ORDER — ALBUTEROL SULFATE (2.5 MG/3ML) 0.083% IN NEBU: 2.5000 mg | INHALATION_SOLUTION | RESPIRATORY_TRACT | 2 refills | Status: DC | PRN

## 2020-04-28 MED ORDER — METOPROLOL SUCCINATE ER 25 MG PO TB24: 12.5000 mg | ORAL_TABLET | Freq: Every day | ORAL | 0 refills | Status: DC

## 2020-04-28 MED ORDER — IPRATROPIUM-ALBUTEROL 0.5-2.5 (3) MG/3ML IN SOLN: 3.0000 mL | Freq: Three times a day (TID) | RESPIRATORY_TRACT | Status: DC

## 2020-04-28 MED ORDER — IPRATROPIUM BROMIDE 0.02 % IN SOLN: 0.5000 mg | Freq: Four times a day (QID) | RESPIRATORY_TRACT | 0 refills | Status: DC | PRN

## 2020-04-28 MED ORDER — GUAIFENESIN ER 600 MG PO TB12: 1200.0000 mg | ORAL_TABLET | Freq: Two times a day (BID) | ORAL | 0 refills | Status: AC

## 2020-04-28 NOTE — Discharge Summary (Addendum)
Physician Discharge Summary  Jenna [name] OZH:[id number] DOB: [birth year] DOA: 04/22/2020  PCP: Olin Hauser, DO  Admit date: 04/22/2020 Discharge date: 04/28/2020  Admitted From: Home Disposition: Home  Recommendations for Outpatient Follow-up:  1. Follow up with PCP in 1-2 weeks 2. Continue prednisone taper on discharge 3. Metoprolol succinate reduced from 25 mg to 12.5 mg p.o. daily by PCCM 4. Referral to [facility] physical therapy in [place]  for further evaluation and treatment   Home Health: No Equipment/Devices: None  Discharge Condition: Stable CODE STATUS: Full code Diet recommendation: Heart healthy/consistent carbohydrate diet  History of present illness:  Jenna Garduno Combsis a 85 y.o.female with a history of COPD, diabetes, gastric bypass, obesity. Patient presented secondary to shortness of breath and found to have a COPD exacerbation. Steroids and Duonebs started.  Hospital course:  Acute hypoxic respiratory failure with COPD exacerbation.   Patient presenting with significant shortness of breath, wheezing, as well as hoarseness.  CT soft tissue neck with no significant abnormality.  Patient was started on IV Solu-Medrol, doxycycline and scheduled duo nebs with improvement in her oxygenation and eventually titrated off of supplemental oxygen.  Patient was seen by PCCM and recommended prolonged steroid taper outpatient.  Albuterol as needed.  Continue home  Breo Elipta inhaler.  Albuterol/ipratropium nebs as needed.  Continue incentive spirometry and flutter valve at home.  May benefit from ENT evaluation outpatient for hoarseness.  Elevated troponin.   No chest pain.  Echocardiogram without any wall motion abnormalities. Most likely secondary to demand ischemia with COPD exacerbation.   Type 2 diabetes mellitus.   Diet controlled with A1c of 6.5  History of depression.  Continue citalopram.  GERD. Continue famotidine  History of iron deficiency  anemia.  Continue home iron supplement.  Essential hypertension.   Home metoprolol succinate reduced from 25 mg to 12.5 mg by PCCM due to underlying COPD.  Outpatient follow-up with PCP for further monitoring  Hyperlipidemia Continue simvastatin  Insomnia Continue trazodone  Obesity Body mass index is 36.31 kg/m.  Discussed with patient needs for aggressive lifestyle changes/weight loss as this complicates all facets of care.  Outpatient follow-up with PCP.   Discharge Diagnoses:  Principal Problem:   COPD exacerbation ([place]) Active Problems:   Obesity (BMI 35.0-39.9 without comorbidity)   Depression, major, recurrent (HCC)   Centrilobular emphysema (HCC)   Controlled type 2 diabetes mellitus with diabetic nephropathy, without long-term current use of insulin (HCC)   PSVT (paroxysmal supraventricular tachycardia) (HCC)   Gastric bypass status for obesity    Discharge Instructions  Discharge Instructions    Call MD for:  difficulty breathing, headache or visual disturbances   Complete by: As directed    Call MD for:  extreme fatigue   Complete by: As directed    Call MD for:  persistant dizziness or light-headedness   Complete by: As directed    Call MD for:  persistant nausea and vomiting   Complete by: As directed    Call MD for:  severe uncontrolled pain   Complete by: As directed    Call MD for:  temperature >100.4   Complete by: As directed    Diet - low sodium heart healthy   Complete by: As directed    Increase activity slowly   Complete by: As directed      Allergies as of 04/28/2020      Reactions   Azithromycin Other (See Comments)   Causes stomach burning pains   Hydromorphone Other (See  Comments)   confusion, personality change   Morphine Other (See Comments)   Goes crazy   Nsaids Other (See Comments)   Avoids because of gastric bypass surgery   Oxycodone-acetaminophen Nausea And Vomiting   Codeine Other (See Comments), Nausea And Vomiting    "will not stay down"   Nabumetone Rash   Oxycodone Nausea And Vomiting   Oxycodone-acetaminophen Nausea And Vomiting   Promethazine Hcl Rash, Other (See Comments)      Medication List    STOP taking these medications   cefdinir 300 MG capsule Commonly known as: OMNICEF     TAKE these medications   albuterol 108 (90 Base) MCG/ACT inhaler Commonly known as: VENTOLIN HFA Inhale 2 puffs into the lungs every 6 (six) hours as needed for wheezing or shortness of breath. What changed: Another medication with the same name was added. Make sure you understand how and when to take each.   albuterol (2.5 MG/3ML) 0.083% nebulizer solution Commonly known as: PROVENTIL Take 3 mLs (2.5 mg total) by nebulization every 4 (four) hours as needed for wheezing or shortness of breath. What changed: You were already taking a medication with the same name, and this prescription was added. Make sure you understand how and when to take each.   alendronate 70 MG tablet Commonly known as: FOSAMAX Take 1 tablet (70 mg total) by mouth once a week.   Breo Ellipta 100-25 MCG/INH Aepb Generic drug: fluticasone furoate-vilanterol Inhale 1 puff into the lungs daily. What changed: how to take this   Cholecalciferol 25 MCG (1000 UT) tablet Take 1,000 Units by mouth daily.   citalopram 40 MG tablet Commonly known as: CELEXA Take 1 tablet (40 mg total) by mouth daily.   clotrimazole-betamethasone cream Commonly known as: LOTRISONE Apply 1-2 times a day for worsening flare dry skin dermatitis of toes/feet, may re-use daily up to 1 week as needed.   Cyanocobalamin 1000 MCG Tbcr Take 1,000 mcg by mouth daily.   diclofenac sodium 1 % Gel Commonly known as: VOLTAREN Apply 2 g topically 3 (three) times daily as needed. For hands, arthritis   famotidine 20 MG tablet Commonly known as: PEPCID Take 20 mg by mouth 2 (two) times daily.   ferrous sulfate 325 (65 FE) MG EC tablet Take 325 mg by mouth at  bedtime.   Fish Oil 1000 MG Caps Take 1,000 mg by mouth daily.   furosemide 20 MG tablet Commonly known as: LASIX Take 1 tablet (20 mg total) by mouth daily.   guaiFENesin 600 MG 12 hr tablet Commonly known as: MUCINEX Take 2 tablets (1,200 mg total) by mouth 2 (two) times daily for 7 days.   ipratropium 0.02 % nebulizer solution Commonly known as: ATROVENT Take 2.5 mLs (0.5 mg total) by nebulization every 6 (six) hours as needed for wheezing or shortness of breath. What changed:   when to take this  reasons to take this   methocarbamol 500 MG tablet Commonly known as: Robaxin Take 1 tablet (500 mg total) by mouth every 8 (eight) hours as needed for muscle spasms.   metoprolol succinate 25 MG 24 hr tablet Commonly known as: TOPROL-XL Take 0.5 tablets (12.5 mg total) by mouth daily. Start taking on: April 29, 2020 What changed: how much to take   One-A-Day [employer] Take 2 tablets by mouth every morning.   predniSONE 5 MG tablet Commonly known as: DELTASONE Take 10 tablets (50 mg total) by mouth daily for 1 day, THEN 9 tablets (  45 mg total) daily for 1 day, THEN 8 tablets (40 mg total) daily for 1 day, THEN 7 tablets (35 mg total) daily for 1 day, THEN 6 tablets (30 mg total) daily for 1 day, THEN 5 tablets (25 mg total) daily for 1 day, THEN 4 tablets (20 mg total) daily for 1 day, THEN 3 tablets (15 mg total) daily for 1 day, THEN 2 tablets (10 mg total) daily for 1 day, THEN 1 tablet (5 mg total) daily for 1 day. Start taking on: April 29, 2020 What changed:   medication strength  See the new instructions.   simvastatin 40 MG tablet Commonly known as: ZOCOR Take 1 tablet (40 mg total) by mouth daily.   traZODone 50 MG tablet Commonly known as: DESYREL Take 1 tablet (50 mg total) by mouth at bedtime.   vitamin C 1000 MG tablet Take 500 mg by mouth daily.       Allergies  Allergen Reactions  . Azithromycin Other (See Comments)    Causes stomach  burning pains  . Hydromorphone Other (See Comments)    confusion, personality change  . Morphine Other (See Comments)    Goes crazy  . Nsaids Other (See Comments)    Avoids because of gastric bypass surgery  . Oxycodone-Acetaminophen Nausea And Vomiting  . Codeine Other (See Comments) and Nausea And Vomiting    "will not stay down"  . Nabumetone Rash  . Oxycodone Nausea And Vomiting  . Oxycodone-Acetaminophen Nausea And Vomiting  . Promethazine Hcl Rash and Other (See Comments)    Consultations:  PCCM, Dr. Lanney Gins   Procedures/Studies: CT SOFT TISSUE NECK WO CONTRAST  Result Date: 04/26/2020 CLINICAL DATA:  Hoarseness.  COPD exacerbation. EXAM: CT NECK WITHOUT CONTRAST TECHNIQUE: Multidetector CT imaging of the neck was performed following the standard protocol without intravenous contrast. COMPARISON:  Maxillofacial CT 12/31/2013 FINDINGS: Pharynx and larynx: No evidence of mass or swelling within limitations of mild motion artifact and noncontrast technique. Widely patent airway. No fluid collection or inflammatory changes in the parapharyngeal or retropharyngeal spaces. Salivary glands: No inflammation, mass, or stone. Thyroid: Unremarkable. Lymph nodes: No enlarged or suspicious lymph nodes in the neck. Vascular: Mild asymmetric atherosclerotic calcification at the left carotid bifurcation. Limited intracranial: Unremarkable. Visualized orbits: Bilateral cataract extraction. Mastoids and visualized paranasal sinuses: Clear. Skeleton: Mild cervical disc and facet degeneration. Upper chest: No apical lung consolidation or mass. Other: None. IMPRESSION: No acute abnormality identified in the neck. Electronically Signed   By: Logan Bores M.D.   On: 04/26/2020 13:29   CT Angio Chest PE W and/or Wo Contrast  Result Date: 04/22/2020 CLINICAL DATA:  Respiratory distress. Pulmonary embolus suspected. Absent breath sounds. EXAM: CT ANGIOGRAPHY CHEST WITH CONTRAST TECHNIQUE: Multidetector CT  imaging of the chest was performed using the standard protocol during bolus administration of intravenous contrast. Multiplanar CT image reconstructions and MIPs were obtained to evaluate the vascular anatomy. CONTRAST:  14mL OMNIPAQUE IOHEXOL 350 MG/ML SOLN COMPARISON:  CT angio chest 06/17/2016 FINDINGS: Cardiovascular: Satisfactory opacification of the pulmonary arteries to the segmental level. No evidence of pulmonary embolism. Normal heart size. No significant pericardial effusion. The thoracic aorta is normal in caliber. Mild atherosclerotic plaque of the thoracic aorta. Mild left anterior descending coronary artery calcifications. Mediastinum/Nodes: No enlarged mediastinal, hilar, or axillary lymph nodes. Thyroid gland, trachea, and esophagus demonstrate no significant findings. Tiny hiatal hernia. Lungs/Pleura: Pulmonary micronodule within the right upper lobe (7:43). Left lower lobe calcified pulmonary micronodule (7:72). Several other scattered  pulmonary micronodules. No pulmonary mass. No focal consolidation. No pleural effusion. No pneumothorax. Upper Abdomen: Gastric surgical changes noted. Status post cholecystectomy. Atherosclerotic plaque. Musculoskeletal: No chest wall abnormality. No suspicious lytic or blastic osseous lesions. No acute displaced fracture. Review of the MIP images confirms the above findings. IMPRESSION: 1. No pulmonary embolus. 2. No acute intrathoracic abnormality. 3. Tiny hiatal hernia in a patient status post gastric surgery. Electronically Signed   By: Iven Finn M.D.   On: 04/22/2020 18:27   DG Chest Portable 1 View  Result Date: 04/22/2020 CLINICAL DATA:  Shortness of breath. EXAM: PORTABLE CHEST 1 VIEW COMPARISON:  April 20, 2020 FINDINGS: The cardiomediastinal silhouette is stable. No pneumothorax. No nodules or masses. No focal infiltrates. No overt edema. IMPRESSION: No active disease. Electronically Signed   By: Dorise Bullion III M.D   On: 04/22/2020 13:01    DG Chest Portable 1 View  Result Date: 04/20/2020 CLINICAL DATA:  Shortness of breath for 4 days. EXAM: PORTABLE CHEST 1 VIEW COMPARISON:  Single-view of the chest 09/24/2017. FINDINGS: Lung volumes are slightly lower than on the comparison study. Lungs are clear. Heart size is upper normal. No pneumothorax or pleural fluid. No acute or focal bony abnormality. IMPRESSION: No acute disease. Electronically Signed   By: Inge Rise M.D.   On: 04/20/2020 14:15   ECHOCARDIOGRAM COMPLETE  Result Date: 04/24/2020    ECHOCARDIOGRAM REPORT   Patient Name:   MARQUE BANGO Sansoucie Date of Exam: 04/24/2020 Medical Rec #:  [id number]        Height:       63.0 in Accession #:    [id number]       Weight:       205.0 lb Date of Birth:  May 08, 1935         BSA:          1.954 m Patient Age:    85 years         BP:           143/81 mmHg Patient Gender: F                HR:           83 bpm. Exam Location:  [facility] Procedure: 2D Echo, Color Doppler, Cardiac Doppler and Intracardiac            Opacification Agent Indications:     Elevated troponin  History:         Patient has no prior history of Echocardiogram examinations.                  COPD; Risk Factors:Sleep Apnea, Diabetes and Dyslipidemia.  Sonographer:     Charmayne Sheer RDCS (AE) Referring Phys:  [id number] Annice Needy ECKSTAT Diagnosing Phys: Kathlyn Sacramento MD  Sonographer Comments: Suboptimal apical window and no subcostal window. IMPRESSIONS  1. Left ventricular ejection fraction, by estimation, is 60 to 65%. The left ventricle has normal function. Left ventricular endocardial border not optimally defined to evaluate regional wall motion. Left ventricular diastolic parameters were normal.  2. Right ventricular systolic function is normal. The right ventricular size is normal. Tricuspid regurgitation signal is inadequate for assessing PA pressure.  3. Left atrial size was mildly dilated.  4. A small pericardial effusion is present. The pericardial effusion is posterior to the  left ventricle.  5. The mitral valve is normal in structure. No evidence of mitral valve regurgitation. No evidence of mitral stenosis.  6. The aortic valve is  normal in structure. Aortic valve regurgitation is not visualized. No aortic stenosis is present.  7. The inferior vena cava is dilated in size with >50% respiratory variability, suggesting right atrial pressure of 8 mmHg. FINDINGS  Left Ventricle: Left ventricular ejection fraction, by estimation, is 60 to 65%. The left ventricle has normal function. Left ventricular endocardial border not optimally defined to evaluate regional wall motion. Definity contrast agent was given IV to delineate the left ventricular endocardial borders. The left ventricular internal cavity size was normal in size. There is no left ventricular hypertrophy. Left ventricular diastolic parameters were normal. Right Ventricle: The right ventricular size is normal. No increase in right ventricular wall thickness. Right ventricular systolic function is normal. Tricuspid regurgitation signal is inadequate for assessing PA pressure. Left Atrium: Left atrial size was mildly dilated. Right Atrium: Right atrial size was normal in size. Pericardium: A small pericardial effusion is present. The pericardial effusion is posterior to the left ventricle. Mitral Valve: The mitral valve is normal in structure. No evidence of mitral valve regurgitation. No evidence of mitral valve stenosis. MV peak gradient, 3.8 mmHg. The mean mitral valve gradient is 2.0 mmHg. Tricuspid Valve: The tricuspid valve is normal in structure. Tricuspid valve regurgitation is trivial. No evidence of tricuspid stenosis. Aortic Valve: The aortic valve is normal in structure. Aortic valve regurgitation is not visualized. No aortic stenosis is present. Aortic valve mean gradient measures 5.0 mmHg. Aortic valve peak gradient measures 9.2 mmHg. Aortic valve area, by VTI measures 2.67 cm. Pulmonic Valve: The pulmonic valve was  normal in structure. Pulmonic valve regurgitation is not visualized. No evidence of pulmonic stenosis. Aorta: The aortic root is normal in size and structure. Venous: The inferior vena cava is dilated in size with greater than 50% respiratory variability, suggesting right atrial pressure of 8 mmHg. IAS/Shunts: No atrial level shunt detected by color flow Doppler.  LEFT VENTRICLE PLAX 2D LVIDd:         4.80 cm  Diastology LVIDs:         3.00 cm  LV e' medial:    7.94 cm/s LV PW:         1.50 cm  LV E/e' medial:  10.7 LV IVS:        0.60 cm  LV e' lateral:   10.80 cm/s LVOT diam:     2.10 cm  LV E/e' lateral: 7.9 LV SV:         68 LV SV Index:   35 LVOT Area:     3.46 cm  LEFT ATRIUM             Index LA diam:        4.60 cm 2.35 cm/m LA Vol (A2C):   39.6 ml 20.27 ml/m LA Vol (A4C):   55.8 ml 28.56 ml/m LA Biplane Vol: 52.4 ml 26.82 ml/m  AORTIC VALVE                   PULMONIC VALVE AV Area (Vmax):    2.44 cm    PV Vmax:       1.02 m/s AV Area (Vmean):   2.49 cm    PV Vmean:      65.400 cm/s AV Area (VTI):     2.67 cm    PV VTI:        0.192 m AV Vmax:           152.00 cm/s PV Peak grad:  4.2 mmHg AV Vmean:  99.800 cm/s PV Mean grad:  2.0 mmHg AV VTI:            0.256 m AV Peak Grad:      9.2 mmHg AV Mean Grad:      5.0 mmHg LVOT Vmax:         107.00 cm/s LVOT Vmean:        71.800 cm/s LVOT VTI:          0.197 m LVOT/AV VTI ratio: 0.77  AORTA Ao Root diam: 2.50 cm MITRAL VALVE MV Area (PHT): 4.04 cm    SHUNTS MV Area VTI:   2.93 cm    Systemic VTI:  0.20 m MV Peak grad:  3.8 mmHg    Systemic Diam: 2.10 cm MV Mean grad:  2.0 mmHg MV Vmax:       0.98 m/s MV Vmean:      60.7 cm/s MV Decel Time: 188 msec MV E velocity: 85.30 cm/s MV A velocity: 57.80 cm/s MV E/A ratio:  1.48 Kathlyn Sacramento MD Electronically signed by Kathlyn Sacramento MD Signature Date/Time: 04/24/2020/12:13:14 PM    Final       Subjective: Patient seen and examined at bedside, resting comfortably.  Husband present.  Continues to be  off of oxygen, states ready for discharge home.  Requesting refill of her albuterol/ipratropium nebs.  No other questions or concerns at this time.  Denies headache, no chest pain, palpitations, no shortness of breath, no abdominal pain, no weakness, no fatigue, no paresthesias.  No acute events overnight per nursing staff.  Discharge Exam: Vitals:   04/28/20 0753 04/28/20 0800  BP:  123/67  Pulse:  66  Resp:  14  Temp:  97.6 F (36.4 C)  SpO2: 95% 98%   Vitals:   04/27/20 2317 04/28/20 0357 04/28/20 0753 04/28/20 0800  BP: (!) 121/47 (!) 141/54  123/67  Pulse: 61 61  66  Resp: 16 20  14   Temp: 98 F (36.7 C) 97.7 F (36.5 C)  97.6 F (36.4 C)  TempSrc: Oral Oral  Oral  SpO2: 95% 95% 95% 98%  Weight:      Height:        General: Pt is alert, awake, not in acute distress, obese Cardiovascular: RRR, S1/S2 +, no rubs, no gallops Respiratory: CTA bilaterally, no wheezing, no rhonchi, on room air Abdominal: Soft, NT, ND, bowel sounds + Extremities: no edema, no cyanosis    The results of significant diagnostics from this hospitalization (including imaging, microbiology, ancillary and laboratory) are listed below for reference.     Microbiology: Recent Results (from the past 240 hour(s))  Blood culture (routine x 2)     Status: None   Collection Time: 04/22/20 12:41 PM   Specimen: BLOOD  Result Value Ref Range Status   Specimen Description BLOOD  RIGHT WRIST  Final   Special Requests   Final    BOTTLES DRAWN AEROBIC AND ANAEROBIC Blood Culture results may not be optimal due to an inadequate volume of blood received in culture bottles   Culture   Final    NO GROWTH 5 DAYS Performed at [facility], [address] [place], [place] [address]    Report Status 04/27/2020 FINAL  Final  Resp Panel by RT-PCR (Flu A&B, Covid) Nasopharyngeal Swab     Status: None   Collection Time: 04/22/20 12:47 PM   Specimen: Nasopharyngeal Swab; Nasopharyngeal(NP) swabs in vial  transport medium  Result Value Ref Range Status   SARS Coronavirus 2  by RT PCR NEGATIVE NEGATIVE Final    Comment: (NOTE) SARS-CoV-2 target nucleic acids are NOT DETECTED.  The SARS-CoV-2 RNA is generally detectable in upper respiratory specimens during the acute phase of infection. The lowest concentration of SARS-CoV-2 viral copies this assay can detect is 138 copies/mL. A negative result does not preclude SARS-Cov-2 infection and should not be used as the sole basis for treatment or other patient management decisions. A negative result may occur with  improper specimen collection/handling, submission of specimen other than nasopharyngeal swab, presence of viral mutation(s) within the areas targeted by this assay, and inadequate number of viral copies(<138 copies/mL). A negative result must be combined with clinical observations, patient history, and epidemiological information. The expected result is Negative.  Fact Sheet for Patients:  EntrepreneurPulse.com.au  Fact Sheet for Healthcare Providers:  IncredibleEmployment.be  This test is no t yet approved or cleared by the [place] FDA and  has been authorized for detection and/or diagnosis of SARS-CoV-2 by FDA under an Emergency Use Authorization (EUA). This EUA will remain  in effect (meaning this test can be used) for the duration of the COVID-19 declaration under Section 564(b)(1) of the Act, 21 U.S.C.section 360bbb-3(b)(1), unless the authorization is terminated  or revoked sooner.       Influenza A by PCR NEGATIVE NEGATIVE Final   Influenza B by PCR NEGATIVE NEGATIVE Final    Comment: (NOTE) The Xpert Xpress SARS-CoV-2/FLU/RSV plus assay is intended as an aid in the diagnosis of influenza from Nasopharyngeal swab specimens and should not be used as a sole basis for treatment. Nasal washings and aspirates are unacceptable for Xpert Xpress SARS-CoV-2/FLU/RSV testing.  Fact  Sheet for Patients: EntrepreneurPulse.com.au  Fact Sheet for Healthcare Providers: IncredibleEmployment.be  This test is not yet approved or cleared by the [place] FDA and has been authorized for detection and/or diagnosis of SARS-CoV-2 by FDA under an Emergency Use Authorization (EUA). This EUA will remain in effect (meaning this test can be used) for the duration of the COVID-19 declaration under Section 564(b)(1) of the Act, 21 U.S.C. section 360bbb-3(b)(1), unless the authorization is terminated or revoked.  Performed at [facility], [address] [place], [place] [address]   Blood culture (routine x 2)     Status: None   Collection Time: 04/22/20  1:06 PM   Specimen: BLOOD  Result Value Ref Range Status   Specimen Description BLOOD  RIGHT ARM  Final   Special Requests   Final    BOTTLES DRAWN AEROBIC AND ANAEROBIC Blood Culture adequate volume   Culture   Final    NO GROWTH 5 DAYS Performed at [facility], [address] [place], [place] [address]    Report Status 04/27/2020 FINAL  Final  Culture, sputum-assessment     Status: None   Collection Time: 04/22/20  9:48 PM   Specimen: Sputum  Result Value Ref Range Status   Specimen Description SPUTUM  Final   Special Requests NONE  Final   Sputum evaluation   Final    Sputum specimen not acceptable for testing.  Please recollect.   C/MARY ANN RIBAY PAGDATOON AT [id number] 04/23/20.PMF Performed at [facility], [address] [place], [place] [address]    Report Status 04/23/2020 FINAL  Final  Respiratory (~20 pathogens) panel by PCR     Status: None   Collection Time: 04/23/20  2:35 PM   Specimen: Nasopharyngeal Swab; Respiratory  Result Value Ref Range Status   Adenovirus NOT DETECTED  NOT DETECTED Final   Coronavirus 229E NOT DETECTED NOT DETECTED Final    Comment: (NOTE) The Coronavirus on the Respiratory Panel, DOES NOT test for the novel   Coronavirus (2019 nCoV)    Coronavirus HKU1 NOT DETECTED NOT DETECTED Final   Coronavirus NL63 NOT DETECTED NOT DETECTED Final   Coronavirus OC43 NOT DETECTED NOT DETECTED Final   Metapneumovirus NOT DETECTED NOT DETECTED Final   Rhinovirus / Enterovirus NOT DETECTED NOT DETECTED Final   Influenza A NOT DETECTED NOT DETECTED Final   Influenza B NOT DETECTED NOT DETECTED Final   Parainfluenza Virus 1 NOT DETECTED NOT DETECTED Final   Parainfluenza Virus 2 NOT DETECTED NOT DETECTED Final   Parainfluenza Virus 3 NOT DETECTED NOT DETECTED Final   Parainfluenza Virus 4 NOT DETECTED NOT DETECTED Final   Respiratory Syncytial Virus NOT DETECTED NOT DETECTED Final   Bordetella pertussis NOT DETECTED NOT DETECTED Final   Bordetella Parapertussis NOT DETECTED NOT DETECTED Final   Chlamydophila pneumoniae NOT DETECTED NOT DETECTED Final   Mycoplasma pneumoniae NOT DETECTED NOT DETECTED Final    Comment: Performed at [facility], [place] [address] [place], [place] [address]     Labs: BNP (last 3 results) Recent Labs    04/22/20 1306  BNP 253.6*   Basic Metabolic Panel: Recent Labs  Lab 04/22/20 1241 04/28/20 0503  NA 137 136  K 4.9 4.2  CL 104 94*  CO2 23 29  GLUCOSE 160* 241*  BUN 23 44*  CREATININE 1.06* 1.16*  CALCIUM 8.9 9.5   Liver Function Tests: Recent Labs  Lab 04/22/20 1241 04/27/20 1620  AST 51* 36  ALT 44 34  ALKPHOS 52 62  BILITOT 1.1 0.8  PROT 6.4* 6.5  ALBUMIN 3.7 3.7   No results for input(s): LIPASE, AMYLASE in the last 168 hours. No results for input(s): AMMONIA in the last 168 hours. CBC: Recent Labs  Lab 04/22/20 1241 04/24/20 1123 04/28/20 0503  WBC 13.2* 12.1* 10.8*  NEUTROABS 7.0  --   --   HGB 12.2 12.0 13.2  HCT 36.9 35.5* 38.3  MCV 99.2 96.2 94.3  PLT 245 202 205   Cardiac Enzymes: No results for input(s): CKTOTAL, CKMB, CKMBINDEX, TROPONINI in the last 168 hours. BNP: Invalid input(s): POCBNP CBG: Recent Labs  Lab  04/27/20 0740 04/27/20 1142 04/27/20 1650 04/27/20 2120 04/28/20 0750  GLUCAP 105* 240* 227* 244* 203*   D-Dimer No results for input(s): DDIMER in the last 72 hours. Hgb A1c No results for input(s): HGBA1C in the last 72 hours. Lipid Profile No results for input(s): CHOL, HDL, LDLCALC, TRIG, CHOLHDL, LDLDIRECT in the last 72 hours. Thyroid function studies No results for input(s): TSH, T4TOTAL, T3FREE, THYROIDAB in the last 72 hours.  Invalid input(s): FREET3 Anemia work up No results for input(s): VITAMINB12, FOLATE, FERRITIN, TIBC, IRON, RETICCTPCT in the last 72 hours. Urinalysis    Component Value Date/Time   COLORURINE AMBER (A) 09/22/2015 1214   APPEARANCEUR CLEAR (A) 09/22/2015 1214   LABSPEC 1.025 09/22/2015 1214   PHURINE 5.0 09/22/2015 1214   GLUCOSEU NEGATIVE 09/22/2015 1214   HGBUR NEGATIVE 09/22/2015 1214   BILIRUBINUR 2+ (A) 09/22/2015 1214   KETONESUR TRACE (A) 09/22/2015 1214   PROTEINUR 30 (A) 09/22/2015 1214   NITRITE NEGATIVE 09/22/2015 1214   LEUKOCYTESUR 1+ (A) 09/22/2015 1214   Sepsis Labs Invalid input(s): PROCALCITONIN,  WBC,  LACTICIDVEN Microbiology Recent Results (from the past 240 hour(s))  Blood culture (routine x 2)  Status: None   Collection Time: 04/22/20 12:41 PM   Specimen: BLOOD  Result Value Ref Range Status   Specimen Description BLOOD  RIGHT WRIST  Final   Special Requests   Final    BOTTLES DRAWN AEROBIC AND ANAEROBIC Blood Culture results may not be optimal due to an inadequate volume of blood received in culture bottles   Culture   Final    NO GROWTH 5 DAYS Performed at [facility], [address] [place], [place] [address]    Report Status 04/27/2020 FINAL  Final  Resp Panel by RT-PCR (Flu A&B, Covid) Nasopharyngeal Swab     Status: None   Collection Time: 04/22/20 12:47 PM   Specimen: Nasopharyngeal Swab; Nasopharyngeal(NP) swabs in vial transport medium  Result Value Ref Range Status   SARS Coronavirus  2 by RT PCR NEGATIVE NEGATIVE Final    Comment: (NOTE) SARS-CoV-2 target nucleic acids are NOT DETECTED.  The SARS-CoV-2 RNA is generally detectable in upper respiratory specimens during the acute phase of infection. The lowest concentration of SARS-CoV-2 viral copies this assay can detect is 138 copies/mL. A negative result does not preclude SARS-Cov-2 infection and should not be used as the sole basis for treatment or other patient management decisions. A negative result may occur with  improper specimen collection/handling, submission of specimen other than nasopharyngeal swab, presence of viral mutation(s) within the areas targeted by this assay, and inadequate number of viral copies(<138 copies/mL). A negative result must be combined with clinical observations, patient history, and epidemiological information. The expected result is Negative.  Fact Sheet for Patients:  EntrepreneurPulse.com.au  Fact Sheet for Healthcare Providers:  IncredibleEmployment.be  This test is no t yet approved or cleared by the [place] FDA and  has been authorized for detection and/or diagnosis of SARS-CoV-2 by FDA under an Emergency Use Authorization (EUA). This EUA will remain  in effect (meaning this test can be used) for the duration of the COVID-19 declaration under Section 564(b)(1) of the Act, 21 U.S.C.section 360bbb-3(b)(1), unless the authorization is terminated  or revoked sooner.       Influenza A by PCR NEGATIVE NEGATIVE Final   Influenza B by PCR NEGATIVE NEGATIVE Final    Comment: (NOTE) The Xpert Xpress SARS-CoV-2/FLU/RSV plus assay is intended as an aid in the diagnosis of influenza from Nasopharyngeal swab specimens and should not be used as a sole basis for treatment. Nasal washings and aspirates are unacceptable for Xpert Xpress SARS-CoV-2/FLU/RSV testing.  Fact Sheet for Patients: EntrepreneurPulse.com.au  Fact  Sheet for Healthcare Providers: IncredibleEmployment.be  This test is not yet approved or cleared by the [place] FDA and has been authorized for detection and/or diagnosis of SARS-CoV-2 by FDA under an Emergency Use Authorization (EUA). This EUA will remain in effect (meaning this test can be used) for the duration of the COVID-19 declaration under Section 564(b)(1) of the Act, 21 U.S.C. section 360bbb-3(b)(1), unless the authorization is terminated or revoked.  Performed at [facility], [address] [place], [place] [address]   Blood culture (routine x 2)     Status: None   Collection Time: 04/22/20  1:06 PM   Specimen: BLOOD  Result Value Ref Range Status   Specimen Description BLOOD  RIGHT ARM  Final   Special Requests   Final    BOTTLES DRAWN AEROBIC AND ANAEROBIC Blood Culture adequate volume   Culture   Final    NO GROWTH 5 DAYS Performed at [facility], [place]  [address] [place], [place] [address]    Report Status 04/27/2020 FINAL  Final  Culture, sputum-assessment     Status: None   Collection Time: 04/22/20  9:48 PM   Specimen: Sputum  Result Value Ref Range Status   Specimen Description SPUTUM  Final   Special Requests NONE  Final   Sputum evaluation   Final    Sputum specimen not acceptable for testing.  Please recollect.   C/MARY ANN RIBAY PAGDATOON AT 3151 04/23/20.PMF Performed at [facility], [address] [place], [place] [address]    Report Status 04/23/2020 FINAL  Final  Respiratory (~20 pathogens) panel by PCR     Status: None   Collection Time: 04/23/20  2:35 PM   Specimen: Nasopharyngeal Swab; Respiratory  Result Value Ref Range Status   Adenovirus NOT DETECTED NOT DETECTED Final   Coronavirus 229E NOT DETECTED NOT DETECTED Final    Comment: (NOTE) The Coronavirus on the Respiratory Panel, DOES NOT test for the novel  Coronavirus (2019 nCoV)    Coronavirus HKU1 NOT DETECTED NOT DETECTED Final    Coronavirus NL63 NOT DETECTED NOT DETECTED Final   Coronavirus OC43 NOT DETECTED NOT DETECTED Final   Metapneumovirus NOT DETECTED NOT DETECTED Final   Rhinovirus / Enterovirus NOT DETECTED NOT DETECTED Final   Influenza A NOT DETECTED NOT DETECTED Final   Influenza B NOT DETECTED NOT DETECTED Final   Parainfluenza Virus 1 NOT DETECTED NOT DETECTED Final   Parainfluenza Virus 2 NOT DETECTED NOT DETECTED Final   Parainfluenza Virus 3 NOT DETECTED NOT DETECTED Final   Parainfluenza Virus 4 NOT DETECTED NOT DETECTED Final   Respiratory Syncytial Virus NOT DETECTED NOT DETECTED Final   Bordetella pertussis NOT DETECTED NOT DETECTED Final   Bordetella Parapertussis NOT DETECTED NOT DETECTED Final   Chlamydophila pneumoniae NOT DETECTED NOT DETECTED Final   Mycoplasma pneumoniae NOT DETECTED NOT DETECTED Final    Comment: Performed at [facility], [place]. [address], [place], [place] [address]     Time coordinating discharge: Over 30 minutes  SIGNED:   Dream Harman J [place] DO  [facility] 04/28/2020, 10:37 AM

## 2020-04-28 NOTE — Plan of Care (Signed)
Discharge order received. Patient mental status is at baseline. Vital signs stable . No signs of acute distress. Discharge instructions given. Patient verbalized understanding. No other issues noted at this time.   

## 2020-04-28 NOTE — Progress Notes (Signed)
Pulmonary Medicine          Date: 04/28/2020,   MRN# [id number] Jenna [name] [birth year]     AdmissionWeight: 93 kg                 CurrentWeight: 93 kg   Referring physician: Dr Reesa Chew   CHIEF COMPLAINT:   Acute exacerbation of COPD with stridor.    HISTORY OF PRESENT ILLNESS   85 yo pleasant F with hx of CKD, Asthma and COPD overlap syndrome, GERD, dyslipidemia came in with worsening SOB which occurred on day of admission . She has chronic LE edema. She required BIPAP on arrival CT chest done with PE protocol negative for PE, there is chronic bronchitic phenotype of COPD with centrilobular emphysema and diffuse but mild congestion consistent with interstitial edema again of mild severity with concomitant Right hemidiaphragm elevation due to hepatomegaly. Generous PA diameter noted suggestive of pulmonary HTN likely due to CKD, COPD. BNP was elevated despite her obesity with BMI>36  TTE was unable to assess tricuspid regurgitation. She had 2:1 elevation of AST/ALT suggestive of possible EtOH assoiciated transaminitis. She had Asthma most her life with several flares requiring hospitalization.   04/28/20- Patient is improved, she feels less dyspnea, she made exellent urine overnight and feels less congestion.  She reports breathing is easier and she can move around better.  She feels today she will do even better with PT/OT and is asking regarding DC.  There is mild hyperglycemia which will improve as we tranisition off steroids, I stopped IV and started pred 50 recommend decrease by 78m daily for taper.  She is done with antibiotics finished doxy course.  She had mild AKI likely from diruesis and we dcd lasix today.  This will improve with time.  I can follow up with her in office she [facility] , will be happy to follow with her in few days post d/c.    PAST MEDICAL HISTORY   Past Medical History:  Diagnosis Date  . Anxiety   . Arthritis     neck, knees(before replacements)  . Asthma   . CKD (chronic kidney disease), stage III ([place]   . COPD (chronic obstructive pulmonary disease) ([place]   . Encounter for colonoscopy due to history of adenomatous colonic polyps   . GERD (gastroesophageal reflux disease)   . Hearing loss of both ears   . History of echocardiogram    a. 10/2013 Echo: EF 60-65%, no rwma, midlly dil LA. Rnl RV fxn.  . Hyperlipidemia   . Osteoporosis   . Palpitations   . PSVT ([place]oxysmal [place]ricular tachycardia) [place]    a. 05/2018 Zio: 99 SVT runs. Fastest 218 x 4:30. Longest 4:38 w/ rate of 172.  . Sleep apnea    resolved with gastric bypass  . Syncope and collapse   . Urine incontinence      SURGICAL HISTORY   Past Surgical History:  Procedure Laterality Date  . BROW LIFT Bilateral 06/27/2015   Procedure: BLEPHAROPLASTY BILATERAL UPPER EYELIDS BILATERAL BLEPHAROTOSIS EYELIDS;  Surgeon: AKarle Starch MD;  [place]  Service: Ophthalmology;  Laterality: Bilateral;  BILATERAL  . CATARACT EXTRACTION W/PHACO Right 02/13/2015   Procedure: CATARACT EXTRACTION PHACO AND INTRAOCULAR LENS PLACEMENT (IOC);  Surgeon: ARonnell Freshwater MD;  [place]  Service: Ophthalmology;  Laterality: Right;  . CATARACT EXTRACTION W/PHACO Left 03/22/2015   Procedure: CATARACT EXTRACTION PHACO AND INTRAOCULAR LENS  PLACEMENT (IOC);  Surgeon: Ronnell Freshwater, MD;  Location: [place];  Service: Ophthalmology;  Laterality: Left;  TORIC  . CHOLECYSTECTOMY  1984  . COLECTOMY    . COSMETIC SURGERY  2012   tummy tuck and excess skin removal  . GASTRIC BYPASS  2010  . REPLACEMENT TOTAL KNEE BILATERAL  1998  . SHOULDER SURGERY  2009   left   . TONSILLECTOMY    . TOTAL ABDOMINAL HYSTERECTOMY  1979     FAMILY HISTORY   Family History  Problem Relation Age of Onset  . Heart attack Mother   . Hypertension Mother   . Diabetes type II Mother   . Pneumonia Father    . Skin cancer Father   . Diabetes type II Father   . Colon cancer Sister   . Ovarian cancer Sister   . Heart attack Brother   . Heart attack Sister 81  . Hyperlipidemia Sister   . Hypertension Sister      SOCIAL HISTORY   Social History   Tobacco Use  . Smoking status: Former Smoker    Packs/day: 1.00    Years: 20.00    Pack years: 20.00    Types: Cigarettes    Quit date: 07/1981    Years since quitting: 38.7  . Smokeless tobacco: Former Network engineer Use Topics  . Alcohol use: Not Currently    Comment: past  . Drug use: No     MEDICATIONS    Home Medication:    Current Medication:  Current Facility-Administered Medications:  .  albuterol (PROVENTIL) (2.5 MG/3ML) 0.083% nebulizer solution 2.5 mg, 2.5 mg, Nebulization, Q4H PRN, Athena Masse, MD, 2.5 mg at 04/27/20 0131 .  ascorbic acid (VITAMIN C) tablet 500 mg, 500 mg, Oral, Daily, Clarnce Flock, MD, 500 mg at 04/27/20 1937 .  benzonatate (TESSALON) capsule 200 mg, 200 mg, Oral, BID, Mariel Aloe, MD, 200 mg at 04/27/20 2147 .  cholecalciferol (VITAMIN D3) tablet 1,000 Units, 1,000 Units, Oral, Daily, Clarnce Flock, MD, 1,000 Units at 04/27/20 [phone] .  citalopram (CELEXA) tablet 40 mg, 40 mg, Oral, Daily, Clarnce Flock, MD, 40 mg at 04/27/20 0834 .  enoxaparin (LOVENOX) injection 50 mg, 50 mg, Subcutaneous, Q24H, Clarnce Flock, MD, 50 mg at 04/27/20 2147 .  famotidine (PEPCID) tablet 20 mg, 20 mg, Oral, QHS, Clarnce Flock, MD, 20 mg at 04/27/20 2147 .  ferrous sulfate tablet 325 mg, 325 mg, Oral, QHS, Clarnce Flock, MD, 325 mg at 04/27/20 1237 .  furosemide (LASIX) injection 20 mg, 20 mg, Intravenous, Daily, Lanney Gins, Raahil Ong, MD .  guaiFENesin (MUCINEX) 12 hr tablet 1,200 mg, 1,200 mg, Oral, BID, Mariel Aloe, MD, 1,200 mg at 04/27/20 2146 .  HYDROcodone-homatropine (HYCODAN) 5-1.5 MG/5ML syrup 5 mL, 5 mL, Oral, Q4H PRN, Mariel Aloe, MD, 5 mL at 04/28/20 0557 .   hydrOXYzine (ATARAX/VISTARIL) tablet 25 mg, 25 mg, Oral, TID, Mariel Aloe, MD, 25 mg at 04/27/20 2146 .  insulin aspart (novoLOG) injection 0-15 Units, 0-15 Units, Subcutaneous, TID WC, Athena Masse, MD, 5 Units at 04/27/20 1757 .  insulin aspart (novoLOG) injection 0-5 Units, 0-5 Units, Subcutaneous, QHS, Athena Masse, MD, 2 Units at 04/27/20 2147 .  ipratropium-albuterol (DUONEB) 0.5-2.5 (3) MG/3ML nebulizer solution 3 mL, 3 mL, Nebulization, Once, Judd Gaudier V, MD .  ipratropium-albuterol (DUONEB) 0.5-2.5 (3) MG/3ML nebulizer solution 3 mL, 3 mL, Nebulization, QID, Lorella Nimrod, MD, 3 mL at 04/28/20  0750 .  methylPREDNISolone sodium succinate (SOLU-MEDROL) 40 mg/mL injection 40 mg, 40 mg, Intravenous, Q12H, Lorella Nimrod, MD, 40 mg at 04/27/20 2000 .  metoprolol succinate (TOPROL-XL) 24 hr tablet 12.5 mg, 12.5 mg, Oral, Daily, Liesel Peckenpaugh, MD .  multivitamin with minerals tablet 1 tablet, 1 tablet, Oral, Q lunch, Clarnce Flock, MD, 1 tablet at 04/27/20 1237 .  omega-3 acid ethyl esters (LOVAZA) capsule 1 g, 1 g, Oral, Daily, Clarnce Flock, MD, 1 g at 04/27/20 [phone] .  pantoprazole (PROTONIX) EC tablet 40 mg, 40 mg, Oral, Daily, Mariel Aloe, MD, 40 mg at 04/27/20 0833 .  simvastatin (ZOCOR) tablet 40 mg, 40 mg, Oral, Daily, Clarnce Flock, MD, 40 mg at 04/27/20 0837 .  sodium chloride flush (NS) 0.9 % injection 3 mL, 3 mL, Intravenous, Q12H, Clarnce Flock, MD, 3 mL at 04/27/20 2148 .  traZODone (DESYREL) tablet 50 mg, 50 mg, Oral, QHS, Clarnce Flock, MD, 50 mg at 04/27/20 2146 .  vitamin B-12 (CYANOCOBALAMIN) tablet 1,000 mcg, 1,000 mcg, Oral, Daily, Clarnce Flock, MD, 1,000 mcg at 04/27/20 0347    ALLERGIES   Azithromycin, Hydromorphone, Morphine, Nsaids, Oxycodone-acetaminophen, Codeine, Nabumetone, Oxycodone, Oxycodone-acetaminophen, and Promethazine hcl     REVIEW OF SYSTEMS    Review of Systems:  Gen:  Denies  fever, sweats, chills  weigh loss  HEENT: Denies blurred vision, double vision, ear pain, eye pain, hearing loss, nose bleeds, sore throat Cardiac:  No dizziness, chest pain or heaviness, chest tightness,edema Resp:   Denies cough or sputum porduction, shortness of breath,wheezing, hemoptysis,  Gi: Denies swallowing difficulty, stomach pain, nausea or vomiting, diarrhea, constipation, bowel incontinence Gu:  Denies bladder incontinence, burning urine Ext:   Denies Joint pain, stiffness or swelling Skin: Denies  skin rash, easy bruising or bleeding or hives Endoc:  Denies polyuria, polydipsia , polyphagia or weight change Psych:   Denies depression, insomnia or hallucinations   Other:  All other systems negative   VS: BP 123/67 (BP Location: Left Arm)   Pulse 66   Temp 97.6 F (36.4 C) (Oral)   Resp 14   Ht _0  (1.6 m)   Wt 93 kg   SpO2 98%   BMI 36.31 kg/m      PHYSICAL EXAM    GENERAL:NAD, no fevers, chills, no weakness no fatigue HEAD: Normocephalic, atraumatic.  EYES: Pupils equal, round, reactive to light. Extraocular muscles intact. No scleral icterus.  MOUTH: Moist mucosal membrane. Dentition intact. No abscess noted.  EAR, NOSE, THROAT: Clear without exudates. No external lesions.  NECK: Supple. No thyromegaly. No nodules. No JVD.  PULMONARY: clear to auscultation.  CARDIOVASCULAR: S1 and S2. Regular rate and rhythm. No murmurs, rubs, or gallops. No edema. Pedal pulses 2+ bilaterally.  GASTROINTESTINAL: Soft, nontender, nondistended. No masses. Positive bowel sounds. No hepatosplenomegaly.  MUSCULOSKELETAL: No swelling, clubbing, or edema. Range of motion full in all extremities.  NEUROLOGIC: Cranial nerves II through XII are intact. No gross focal neurological deficits. Sensation intact. Reflexes intact.  SKIN: No ulceration, lesions, rashes, or cyanosis. Skin warm and dry. Turgor intact.  PSYCHIATRIC: Mood, affect within normal limits. The patient is awake, alert and oriented x 3.  Insight, judgment intact.       IMAGING    CT SOFT TISSUE NECK WO CONTRAST  Result Date: 04/26/2020 CLINICAL DATA:  Hoarseness.  COPD exacerbation. EXAM: CT NECK WITHOUT CONTRAST TECHNIQUE: Multidetector CT imaging of the neck was performed following the standard protocol without intravenous  contrast. COMPARISON:  Maxillofacial CT 12/31/2013 FINDINGS: Pharynx and larynx: No evidence of mass or swelling within limitations of mild motion artifact and noncontrast technique. Widely patent airway. No fluid collection or inflammatory changes in the parapharyngeal or retropharyngeal spaces. Salivary glands: No inflammation, mass, or stone. Thyroid: Unremarkable. Lymph nodes: No enlarged or suspicious lymph nodes in the neck. Vascular: Mild asymmetric atherosclerotic calcification at the left carotid bifurcation. Limited intracranial: Unremarkable. Visualized orbits: Bilateral cataract extraction. Mastoids and visualized paranasal sinuses: Clear. Skeleton: Mild cervical disc and facet degeneration. Upper chest: No apical lung consolidation or mass. Other: None. IMPRESSION: No acute abnormality identified in the neck. Electronically Signed   By: Logan Bores M.D.   On: 04/26/2020 13:29   CT Angio Chest PE W and/or Wo Contrast  Result Date: 04/22/2020 CLINICAL DATA:  Respiratory distress. Pulmonary embolus suspected. Absent breath sounds. EXAM: CT ANGIOGRAPHY CHEST WITH CONTRAST TECHNIQUE: Multidetector CT imaging of the chest was performed using the standard protocol during bolus administration of intravenous contrast. Multiplanar CT image reconstructions and MIPs were obtained to evaluate the vascular anatomy. CONTRAST:  24m OMNIPAQUE IOHEXOL 350 MG/ML SOLN COMPARISON:  CT angio chest 06/17/2016 FINDINGS: Cardiovascular: Satisfactory opacification of the pulmonary arteries to the segmental level. No evidence of pulmonary embolism. Normal heart size. No significant pericardial effusion. The thoracic aorta is  normal in caliber. Mild atherosclerotic plaque of the thoracic aorta. Mild left anterior descending coronary artery calcifications. Mediastinum/Nodes: No enlarged mediastinal, hilar, or axillary lymph nodes. Thyroid gland, trachea, and esophagus demonstrate no significant findings. Tiny hiatal hernia. Lungs/Pleura: Pulmonary micronodule within the right upper lobe (7:43). Left lower lobe calcified pulmonary micronodule (7:72). Several other scattered pulmonary micronodules. No pulmonary mass. No focal consolidation. No pleural effusion. No pneumothorax. Upper Abdomen: Gastric surgical changes noted. Status post cholecystectomy. Atherosclerotic plaque. Musculoskeletal: No chest wall abnormality. No suspicious lytic or blastic osseous lesions. No acute displaced fracture. Review of the MIP images confirms the above findings. IMPRESSION: 1. No pulmonary embolus. 2. No acute intrathoracic abnormality. 3. Tiny hiatal hernia in a patient status post gastric surgery. Electronically Signed   By: MIven FinnM.D.   On: 04/22/2020 18:27   DG Chest Portable 1 View  Result Date: 04/22/2020 CLINICAL DATA:  Shortness of breath. EXAM: PORTABLE CHEST 1 VIEW COMPARISON:  April 20, 2020 FINDINGS: The cardiomediastinal silhouette is stable. No pneumothorax. No nodules or masses. No focal infiltrates. No overt edema. IMPRESSION: No active disease. Electronically Signed   By: DDorise BullionIII M.D   On: 04/22/2020 13:01   DG Chest Portable 1 View  Result Date: 04/20/2020 CLINICAL DATA:  Shortness of breath for 4 days. EXAM: PORTABLE CHEST 1 VIEW COMPARISON:  Single-view of the chest 09/24/2017. FINDINGS: Lung volumes are slightly lower than on the comparison study. Lungs are clear. Heart size is upper normal. No pneumothorax or pleural fluid. No acute or focal bony abnormality. IMPRESSION: No acute disease. Electronically Signed   By: TInge RiseM.D.   On: 04/20/2020 14:15   ECHOCARDIOGRAM COMPLETE  Result Date:  04/24/2020    ECHOCARDIOGRAM REPORT   Patient Name:   AAYONNA SPERANZACOMBS Date of Exam: 04/24/2020 Medical Rec #:  [id number]       Height:       63.0 in Accession #:    [id number]      Weight:       205.0 lb Date of Birth:  [birth year]        BSA:  1.954 m Patient Age:    68 years         BP:           143/81 mmHg Patient Gender: F                HR:           83 bpm. Exam Location:  [facility] Procedure: 2D Echo, Color Doppler, Cardiac Doppler and Intracardiac            Opacification Agent Indications:     Elevated troponin  History:         Patient has no prior history of Echocardiogram examinations.                  COPD; Risk Factors:Sleep Apnea, Diabetes and Dyslipidemia.  Sonographer:     Charmayne Sheer RDCS (AE) Referring Phys:  [id number] Annice Needy ECKSTAT Diagnosing Phys: Kathlyn Sacramento MD  Sonographer Comments: Suboptimal apical window and no subcostal window. IMPRESSIONS  1. Left ventricular ejection fraction, by estimation, is 60 to 65%. The left ventricle has normal function. Left ventricular endocardial border not optimally defined to evaluate regional wall motion. Left ventricular diastolic parameters were normal.  2. Right ventricular systolic function is normal. The right ventricular size is normal. Tricuspid regurgitation signal is inadequate for assessing PA pressure.  3. Left atrial size was mildly dilated.  4. A small pericardial effusion is present. The pericardial effusion is posterior to the left ventricle.  5. The mitral valve is normal in structure. No evidence of mitral valve regurgitation. No evidence of mitral stenosis.  6. The aortic valve is normal in structure. Aortic valve regurgitation is not visualized. No aortic stenosis is present.  7. The inferior vena cava is dilated in size with >50% respiratory variability, suggesting right atrial pressure of 8 mmHg. FINDINGS  Left Ventricle: Left ventricular ejection fraction, by estimation, is 60 to 65%. The left ventricle has normal function. Left  ventricular endocardial border not optimally defined to evaluate regional wall motion. Definity contrast agent was given IV to delineate the left ventricular endocardial borders. The left ventricular internal cavity size was normal in size. There is no left ventricular hypertrophy. Left ventricular diastolic parameters were normal. Right Ventricle: The right ventricular size is normal. No increase in right ventricular wall thickness. Right ventricular systolic function is normal. Tricuspid regurgitation signal is inadequate for assessing PA pressure. Left Atrium: Left atrial size was mildly dilated. Right Atrium: Right atrial size was normal in size. Pericardium: A small pericardial effusion is present. The pericardial effusion is posterior to the left ventricle. Mitral Valve: The mitral valve is normal in structure. No evidence of mitral valve regurgitation. No evidence of mitral valve stenosis. MV peak gradient, 3.8 mmHg. The mean mitral valve gradient is 2.0 mmHg. Tricuspid Valve: The tricuspid valve is normal in structure. Tricuspid valve regurgitation is trivial. No evidence of tricuspid stenosis. Aortic Valve: The aortic valve is normal in structure. Aortic valve regurgitation is not visualized. No aortic stenosis is present. Aortic valve mean gradient measures 5.0 mmHg. Aortic valve peak gradient measures 9.2 mmHg. Aortic valve area, by VTI measures 2.67 cm. Pulmonic Valve: The pulmonic valve was normal in structure. Pulmonic valve regurgitation is not visualized. No evidence of pulmonic stenosis. Aorta: The aortic root is normal in size and structure. Venous: The inferior vena cava is dilated in size with greater than 50% respiratory variability, suggesting right atrial pressure of 8 mmHg. IAS/Shunts: No atrial level shunt detected by color  flow Doppler.  LEFT VENTRICLE PLAX 2D LVIDd:         4.80 cm  Diastology LVIDs:         3.00 cm  LV e' medial:    7.94 cm/s LV PW:         1.50 cm  LV E/e' medial:  10.7  LV IVS:        0.60 cm  LV e' lateral:   10.80 cm/s LVOT diam:     2.10 cm  LV E/e' lateral: 7.9 LV SV:         68 LV SV Index:   35 LVOT Area:     3.46 cm  LEFT ATRIUM             Index LA diam:        4.60 cm 2.35 cm/m LA Vol (A2C):   39.6 ml 20.27 ml/m LA Vol (A4C):   55.8 ml 28.56 ml/m LA Biplane Vol: 52.4 ml 26.82 ml/m  AORTIC VALVE                   PULMONIC VALVE AV Area (Vmax):    2.44 cm    PV Vmax:       1.02 m/s AV Area (Vmean):   2.49 cm    PV Vmean:      65.400 cm/s AV Area (VTI):     2.67 cm    PV VTI:        0.192 m AV Vmax:           152.00 cm/s PV Peak grad:  4.2 mmHg AV Vmean:          99.800 cm/s PV Mean grad:  2.0 mmHg AV VTI:            0.256 m AV Peak Grad:      9.2 mmHg AV Mean Grad:      5.0 mmHg LVOT Vmax:         107.00 cm/s LVOT Vmean:        71.800 cm/s LVOT VTI:          0.197 m LVOT/AV VTI ratio: 0.77  AORTA Ao Root diam: 2.50 cm MITRAL VALVE MV Area (PHT): 4.04 cm    SHUNTS MV Area VTI:   2.93 cm    Systemic VTI:  0.20 m MV Peak grad:  3.8 mmHg    Systemic Diam: 2.10 cm MV Mean grad:  2.0 mmHg MV Vmax:       0.98 m/s MV Vmean:      60.7 cm/s MV Decel Time: 188 msec MV E velocity: 85.30 cm/s MV A velocity: 57.80 cm/s MV E/A ratio:  1.48 Kathlyn Sacramento MD Electronically signed by Kathlyn Sacramento MD Signature Date/Time: 04/24/2020/12:13:14 PM    Final       ASSESSMENT/PLAN   Moderate exacerbation of Asthma and COPD overlap syndrome   - reviewed CT chest consistent with AECOPD/ASthma   - patient needs prolonged steroid therapy with taper   - There is also congestion diffusely , I will increase lasix to 40 IV daily from 20  - will decrease metoprolol to 12.5 BID due to bronchospasm  - patient working with OT -will order hepatic function panel as this also contributes to third spacing of fluid and dyspnea.  If abnormal will need more evaluation.  -She has not previously been on O2 supplementally  - She is improved clinically from previous - will continue to follow   -RVP negative -she  had findings consistent with autoimmune disease in past but workup was unrevealing, will order RF/ESR/CRP -resp cultures not acceptable for testing - d/cd lasix , dcd solumedrol , started pred 50 with goal to reduce by 76m daily.  -OK for dc home today 4/8     Thank you for allowing me to participate in the care of this patient.   Patient/Family are satisfied with care plan and all questions have been answered.  This document was prepared using Dragon voice recognition software and may include unintentional dictation errors.     FOttie Glazier M.D.  Division of [place]

## 2020-04-28 NOTE — TOC Transition Note (Signed)
Transition of Care (TOC) - CM/SW Discharge Note   Patient Details  Name: Jenna [name] MRN: [id number] Date of Birth: 12/20/1935  Transition of Care (TOC) CM/SW Contact:   C , LCSW Phone Number: 04/28/2020, 11:20 AM   Clinical Narrative:  Met with patient and husband at bedside. They have decided they want outpatient therapy at [facility] in [place]. CSW called over there and faxed facesheet and discharge summary with MD's recommendation for referral there for eval and treat. Patient confirmed she already has a nebulizer machine at home for her nebulizer meds. No further concerns. CSW signing off.  Final next level of care: OP Rehab Barriers to Discharge: Barriers Resolved   Patient Goals and CMS Choice   CMS Medicare.gov Compare Post Acute Care list provided to:: Patient (Husband at bedside)    Discharge Placement                  Name of family member notified: Jenna [name] Patient and family notified of of transfer: 04/28/20  Discharge Plan and Services     Post Acute Care Choice: Home Health                               Social Determinants of Health (SDOH) Interventions     Readmission Risk Interventions No flowsheet data found.     

## 2020-04-28 NOTE — Care Management Important Message (Signed)
Important Message  Patient Details  Name: Jenna [name] MRN: [id number] Date of Birth: [birth year]   Medicare Important Message Given:  Yes     Dannette Barbara 04/28/2020, 11:55 AM

## 2020-04-28 NOTE — Progress Notes (Signed)
Inpatient Diabetes Program Recommendations  AACE/ADA: New Consensus Statement on Inpatient Glycemic Control (2015)  Target Ranges:  Prepandial:   less than 140 mg/dL      Peak postprandial:   less than 180 mg/dL (1-2 hours)      Critically ill patients:  140 - 180 mg/dL  Results for Hinchcliff, Oluwademilade FAYE "Patrece EVERLEE QUAKENBUSH" (MRN [id number]) as of 04/28/2020 07:59  Ref. Range 04/27/2020 07:40 04/27/2020 11:42 04/27/2020 16:50 04/27/2020 21:20  Glucose-Capillary Latest Ref Range: 70 - 99 mg/dL 105 (H) 240 (H)  5 units NOVOLOG  227 (H)  5 units NOVOLOG 244 (H)  2 units NOVOLOG    Results for Hazelett, Teylor FAYE "Dafina SHERIL HAMMOND" (MRN [id number]) as of 04/28/2020 07:59  Ref. Range 04/28/2020 07:50  Glucose-Capillary Latest Ref Range: 70 - 99 mg/dL 203 (H)   Review of Glycemic Control  Diabetes history: DM2  Outpatient Diabetes meds: None; diet controlled  Current orders: Novolog 0-15 units TID with meals, Novolog 0-5 units QHS     Note Solumedrol dosing increased to 40 mg BID yesterday  CBGs >200 yesterday afternoon and mildly elevated this AM  MD- Please consider while pt getting IV Steroids:  Start Novolog Meal Coverage: Novolog 4 units TID with meals Hold if pt eats <50% of meal, Hold if pt NPO     --Will follow patient during hospitalization--  Wyn Quaker RN, MSN, CDE Diabetes Coordinator Inpatient Glycemic Control Team Team Pager: [phone] (8a-5p)

## 2020-05-01 ENCOUNTER — Telehealth: Payer: Self-pay

## 2020-05-01 NOTE — Telephone Encounter (Signed)
Transition Care Management Follow-up Telephone Call  Date of discharge and from where: 04/28/2020 [facility]  How have you been since you were released from the hospital? Very weak  Any questions or concerns? No  Items Reviewed:  Did the pt receive and understand the discharge instructions provided? Yes   Medications obtained and verified? Yes   Other? No   Any new allergies since your discharge? No   Dietary orders reviewed? Yes  Do you have support at home? Yes   Home Care and Equipment/Supplies: Were home health services ordered? not applicable If so, what is the name of the agency? n/a  Has the agency set up a time to come to the patient's home? not applicable Were any new equipment or medical supplies ordered?  No What is the name of the medical supply agency? n/a Were you able to get the supplies/equipment? not applicable Do you have any questions related to the use of the equipment or supplies? No  Functional Questionnaire: (I = Independent and D = Dependent) ADLs: I  Bathing/Dressing- I  Meal Prep- D  Eating- I  Maintaining continence- I  Transferring/Ambulation- I  Managing Meds- I  Follow up appointments reviewed:   PCP Hospital f/u appt confirmed? Yes  Scheduled to see Dr. Parks Ranger on 05/08/2020 @ 1:20.  [facility] f/u appt confirmed? Yes  Scheduled to see Dr. Raul Del on 05/04/2020 @ 3:45.  Are transportation arrangements needed? Yes   If their condition worsens, is the pt aware to call PCP or go to the Emergency Dept.? Yes  Was the patient provided with contact information for the PCP's office or ED? Yes  Was to pt encouraged to call back with questions or concerns? Yes

## 2020-05-04 DIAGNOSIS — R059 Cough, unspecified: Secondary | ICD-10-CM | POA: Diagnosis not present

## 2020-05-04 DIAGNOSIS — R0602 Shortness of breath: Secondary | ICD-10-CM | POA: Diagnosis not present

## 2020-05-04 DIAGNOSIS — R062 Wheezing: Secondary | ICD-10-CM | POA: Diagnosis not present

## 2020-05-04 DIAGNOSIS — J432 Centrilobular emphysema: Secondary | ICD-10-CM | POA: Diagnosis not present

## 2020-05-08 ENCOUNTER — Encounter: Payer: Self-pay | Admitting: Family Medicine

## 2020-05-08 ENCOUNTER — Other Ambulatory Visit: Payer: Self-pay

## 2020-05-08 ENCOUNTER — Ambulatory Visit (INDEPENDENT_AMBULATORY_CARE_PROVIDER_SITE_OTHER): Payer: Medicare Other | Admitting: Family Medicine

## 2020-05-08 VITALS — BP 142/64 | HR 62 | Ht 63.0 in | Wt 208.2 lb

## 2020-05-08 DIAGNOSIS — I471 Supraventricular tachycardia: Secondary | ICD-10-CM | POA: Diagnosis not present

## 2020-05-08 DIAGNOSIS — J441 Chronic obstructive pulmonary disease with (acute) exacerbation: Secondary | ICD-10-CM

## 2020-05-08 DIAGNOSIS — J432 Centrilobular emphysema: Secondary | ICD-10-CM | POA: Diagnosis not present

## 2020-05-08 MED ORDER — METOPROLOL SUCCINATE ER 25 MG PO TB24: 12.5000 mg | ORAL_TABLET | Freq: Every day | ORAL | 3 refills | Status: DC

## 2020-05-08 NOTE — Patient Instructions (Addendum)
Thank you for coming to the office today.  Keep upcoming apt we will check kidney sugar and thyroid labs in July in 3 months.  If next few days if you feel like you need more prednisone for the breathing call back and we can offer a 1 week taper if needed.  Keep upcoming apt with Dr Raul Del at [facility] - call them to SCHEDULE the 1 month follow-up apt in next 3-4 weeks  Also reminder - Metoprolol medication for heart rate was lowered in hospital from 25mg  down to 12.5mg  - or HALF pill daily.  Please schedule a Follow-up Appointment to: Return in about 3 months (around 08/07/2020) for keep 3 month apts.  If you have any other questions or concerns, please feel free to call the office or send a message through [place]. You may also schedule an earlier appointment if necessary.  Additionally, you may be receiving a survey about your experience at our office within a few days to 1 week by e-mail or mail. We value your feedback.  Nobie Putnam, DO [place]

## 2020-05-08 NOTE — Progress Notes (Signed)
Subjective:    Patient ID: Jenna [name], female    DOB: [birth year], 85 y.o.   MRN: [id number]  Teresita Fanton is a 85 y.o. female presenting on 05/08/2020 for Hospitalization Follow-up and COPD   HPI  HOSPITAL FOLLOW-UP VISIT  Hospital/Location: [facility] Date of Admission: 04/22/20 Date of Discharge: 04/28/20 Transitions of care telephone call: 05/01/20 TOC Call completed by Kellie Simmering LPN  Reason for Admission: COPD exacerbation Primary (+Secondary) Diagnosis: COPD exacerbation  - Hospital H&P and Discharge Summary have been reviewed - Patient presents today 10 days after recent hospitalization. Brief summary of recent course, patient had symptoms of shortness of breath and wheezing, hospitalized, also had Critcal Care PCCM involvement, treated with steroids, supplemental oxygen, albuterol / duonebs, continued to use incentive spirometry. Also had demand ischemia with elevated troponins and ultimately ECHO was negative. Also they lowered metoprolol from 25mg  down to 12.5mg  daily.  She has [facility] on 05/04/20 they stopped Breo and switched to Trelegy 1 puff daily. Determine course after next 30 day follow-up, completed prednisone course and mucinex, flutter valve  - Today reports overall has done fairly well after discharge. Symptoms of dyspnea have improved but only about 30% back to baseline.   I have reviewed the discharge medication list, and have reconciled the current and discharge medications today.   Current Outpatient Medications:  .  albuterol (PROVENTIL) (2.5 MG/3ML) 0.083% nebulizer solution, Take 3 mLs (2.5 mg total) by nebulization every 4 (four) hours as needed for wheezing or shortness of breath., Disp: 75 mL, Rfl: 2 .  albuterol (VENTOLIN HFA) 108 (90 Base) MCG/ACT inhaler, Inhale 2 puffs into the lungs every 6 (six) hours as needed for wheezing or shortness of breath., Disp: 1 each, Rfl: 2 .  alendronate (FOSAMAX) 70 MG tablet,  Take 1 tablet (70 mg total) by mouth once a week., Disp: 12 tablet, Rfl: 1 .  Ascorbic Acid (VITAMIN C) 1000 MG tablet, Take 500 mg by mouth daily. , Disp: , Rfl:  .  BREO ELLIPTA 100-25 MCG/INH AEPB, Inhale 1 puff into the lungs daily., Disp: 90 each, Rfl: 0 .  Cholecalciferol 25 MCG (1000 UT) tablet, Take 1,000 Units by mouth daily. , Disp: , Rfl:  .  citalopram (CELEXA) 40 MG tablet, Take 1 tablet (40 mg total) by mouth daily., Disp: 90 tablet, Rfl: 1 .  clotrimazole-betamethasone (LOTRISONE) cream, Apply 1-2 times a day for worsening flare dry skin dermatitis of toes/feet, may re-use daily up to 1 week as needed., Disp: 30 g, Rfl: 0 .  Cyanocobalamin 1000 MCG TBCR, Take 1,000 mcg by mouth daily. , Disp: , Rfl:  .  diclofenac sodium (VOLTAREN) 1 % GEL, Apply 2 g topically 3 (three) times daily as needed. For hands, arthritis, Disp: 100 g, Rfl: 2 .  famotidine (PEPCID) 20 MG tablet, Take 20 mg by mouth 2 (two) times daily., Disp: , Rfl:  .  ferrous sulfate 325 (65 FE) MG EC tablet, Take 325 mg by mouth at bedtime. , Disp: , Rfl:  .  furosemide (LASIX) 20 MG tablet, Take 1 tablet (20 mg total) by mouth daily., Disp: 90 tablet, Rfl: 1 .  ipratropium (ATROVENT) 0.02 % nebulizer solution, Take 2.5 mLs (0.5 mg total) by nebulization every 6 (six) hours as needed for wheezing or shortness of breath., Disp: 900 mL, Rfl: 0 .  methocarbamol (ROBAXIN) 500 MG tablet, Take 1 tablet (500 mg total) by mouth every 8 (eight) hours as  needed for muscle spasms., Disp: 60 tablet, Rfl: 2 .  Multiple Vitamins-Minerals (ONE-A-DAY WOMENS PETITES) TABS, Take 2 tablets by mouth every morning., Disp: , Rfl:  .  Omega-3 Fatty Acids (FISH OIL) 1000 MG CAPS, Take 1,000 mg by mouth daily., Disp: , Rfl:  .  simvastatin (ZOCOR) 40 MG tablet, Take 1 tablet (40 mg total) by mouth daily., Disp: 90 tablet, Rfl: 1 .  traZODone (DESYREL) 50 MG tablet, Take 1 tablet (50 mg total) by mouth at bedtime., Disp: 30 tablet, Rfl: 5 .   metoprolol succinate (TOPROL-XL) 25 MG 24 hr tablet, Take 0.5 tablets (12.5 mg total) by mouth daily., Disp: 45 tablet, Rfl: 3  ------------------------------------------------------------------------- Social History   Tobacco Use  . Smoking status: Former Smoker    Packs/day: 1.00    Years: 20.00    Pack years: 20.00    Types: Cigarettes    Quit date: 07/1981    Years since quitting: 38.8  . Smokeless tobacco: Former Network engineer Use Topics  . Alcohol use: Not Currently    Comment: past  . Drug use: No    Review of Systems Per HPI unless specifically indicated above     Objective:    BP (!) 142/64   Pulse 62   Ht 5\' 3"  (1.6 m)   Wt 208 lb 3.2 oz (94.4 kg)   SpO2 97%   BMI 36.88 kg/m   Wt Readings from Last 3 Encounters:  05/08/20 208 lb 3.2 oz (94.4 kg)  04/22/20 205 lb (93 kg)  04/20/20 205 lb (93 kg)    Physical Exam Vitals and nursing note reviewed.  Constitutional:      General: She is not in acute distress.    Appearance: She is well-developed. She is obese. She is not diaphoretic.     Comments: Chronically ill appearing but currently fairly well, mostly comfortable, cooperative  HENT:     Head: Normocephalic and atraumatic.  Eyes:     General:        Right eye: No discharge.        Left eye: No discharge.     Conjunctiva/sclera: Conjunctivae normal.  Neck:     Thyroid: No thyromegaly.  Cardiovascular:     Rate and Rhythm: Normal rate and regular rhythm.     Heart sounds: Normal heart sounds. No murmur heard.   Pulmonary:     Effort: No respiratory distress.     Breath sounds: Normal breath sounds. No wheezing or rales.     Comments: Some increased work of breathing, but speaks full sentences. No coughing. Hoarse voice. Musculoskeletal:        General: Normal range of motion.     Cervical back: Normal range of motion and neck supple.     Right lower leg: Edema present.     Left lower leg: Edema present.     Comments: In wheelchair   Lymphadenopathy:     Cervical: No cervical adenopathy.  Skin:    General: Skin is warm and dry.     Findings: No erythema or rash.  Neurological:     Mental Status: She is alert and oriented to person, place, and time.  Psychiatric:        Behavior: Behavior normal.     Comments: Well groomed, good eye contact, normal speech and thoughts        Results for orders placed or performed during the hospital encounter of 04/22/20  Blood culture (routine x 2)   Specimen:  BLOOD  Result Value Ref Range   Specimen Description BLOOD  RIGHT WRIST    Special Requests      BOTTLES DRAWN AEROBIC AND ANAEROBIC Blood Culture results may not be optimal due to an inadequate volume of blood received in culture bottles   Culture      NO GROWTH 5 DAYS Performed at [facility], [address] [place], [place] [address]    Report Status 04/27/2020 FINAL   Blood culture (routine x 2)   Specimen: BLOOD  Result Value Ref Range   Specimen Description BLOOD  RIGHT ARM    Special Requests      BOTTLES DRAWN AEROBIC AND ANAEROBIC Blood Culture adequate volume   Culture      NO GROWTH 5 DAYS Performed at [facility], [address], [place], [place] [address]    Report Status 04/27/2020 FINAL   Resp Panel by RT-PCR (Flu A&B, Covid) Nasopharyngeal Swab   Specimen: Nasopharyngeal Swab; Nasopharyngeal(NP) swabs in vial transport medium  Result Value Ref Range   SARS Coronavirus 2 by RT PCR NEGATIVE NEGATIVE   Influenza A by PCR NEGATIVE NEGATIVE   Influenza B by PCR NEGATIVE NEGATIVE  Culture, sputum-assessment   Specimen: Sputum  Result Value Ref Range   Specimen Description SPUTUM    Special Requests NONE    Sputum evaluation      Sputum specimen not acceptable for testing.  Please recollect.   C/MARY ANN RIBAY PAGDATOON AT [id number] 04/23/20.PMF Performed at [facility], [place]., [place], [place] [address]    Report Status 04/23/2020 FINAL   Respiratory (~20  pathogens) panel by PCR   Specimen: Nasopharyngeal Swab; Respiratory  Result Value Ref Range   Adenovirus NOT DETECTED NOT DETECTED   Coronavirus 229E NOT DETECTED NOT DETECTED   Coronavirus HKU1 NOT DETECTED NOT DETECTED   Coronavirus NL63 NOT DETECTED NOT DETECTED   Coronavirus OC43 NOT DETECTED NOT DETECTED   Metapneumovirus NOT DETECTED NOT DETECTED   Rhinovirus / Enterovirus NOT DETECTED NOT DETECTED   Influenza A NOT DETECTED NOT DETECTED   Influenza B NOT DETECTED NOT DETECTED   Parainfluenza Virus 1 NOT DETECTED NOT DETECTED   Parainfluenza Virus 2 NOT DETECTED NOT DETECTED   Parainfluenza Virus 3 NOT DETECTED NOT DETECTED   Parainfluenza Virus 4 NOT DETECTED NOT DETECTED   Respiratory Syncytial Virus NOT DETECTED NOT DETECTED   Bordetella pertussis NOT DETECTED NOT DETECTED   Bordetella Parapertussis NOT DETECTED NOT DETECTED   Chlamydophila pneumoniae NOT DETECTED NOT DETECTED   Mycoplasma pneumoniae NOT DETECTED NOT DETECTED  CBC with Differential  Result Value Ref Range   WBC 13.2 (H) 4.0 - 10.5 K/uL   RBC 3.72 (L) 3.87 - 5.11 MIL/uL   Hemoglobin 12.2 12.0 - 15.0 g/dL   HCT 36.9 36.0 - 46.0 %   MCV 99.2 80.0 - 100.0 fL   MCH 32.8 26.0 - 34.0 pg   MCHC 33.1 30.0 - 36.0 g/dL   RDW 13.3 11.5 - 15.5 %   Platelets 245 150 - 400 K/uL   nRBC 0.0 0.0 - 0.2 %   Neutrophils Relative % 53 %   Neutro Abs 7.0 1.7 - 7.7 K/uL   Lymphocytes Relative 39 %   Lymphs Abs 5.2 (H) 0.7 - 4.0 K/uL   Monocytes Relative 6 %   Monocytes Absolute 0.8 0.1 - 1.0 K/uL   Eosinophils Relative 1 %   Eosinophils Absolute 0.1 0.0 - 0.5 K/uL   Basophils Relative  1 %   Basophils Absolute 0.1 0.0 - 0.1 K/uL   WBC Morphology MORPHOLOGY UNREMARKABLE    RBC Morphology MORPHOLOGY UNREMARKABLE    Smear Review Normal platelet morphology    Immature Granulocytes 0 %   Abs Immature Granulocytes 0.04 0.00 - 0.07 K/uL  Comprehensive metabolic panel  Result Value Ref Range   Sodium 137 135 - 145 mmol/L    Potassium 4.9 3.5 - 5.1 mmol/L   Chloride 104 98 - 111 mmol/L   CO2 23 22 - 32 mmol/L   Glucose, Bld 160 (H) 70 - 99 mg/dL   BUN 23 8 - 23 mg/dL   Creatinine, Ser 1.06 (H) 0.44 - 1.00 mg/dL   Calcium 8.9 8.9 - 10.3 mg/dL   Total Protein 6.4 (L) 6.5 - 8.1 g/dL   Albumin 3.7 3.5 - 5.0 g/dL   AST 51 (H) 15 - 41 U/L   ALT 44 0 - 44 U/L   Alkaline Phosphatase 52 38 - 126 U/L   Total Bilirubin 1.1 0.3 - 1.2 mg/dL   GFR, Estimated 52 (L) >60 mL/min   Anion gap 10 5 - 15  Lactic acid, plasma  Result Value Ref Range   Lactic Acid, Venous 1.7 0.5 - 1.9 mmol/L  Brain natriuretic peptide  Result Value Ref Range   B Natriuretic Peptide 138.4 (H) 0.0 - 100.0 pg/mL  Protime-INR  Result Value Ref Range   Prothrombin Time 12.3 11.4 - 15.2 seconds   INR 1.0 0.8 - 1.2  D-dimer, quantitative  Result Value Ref Range   D-Dimer, Quant 0.54 (H) 0.00 - 0.50 ug/mL-FEU  Blood gas, venous  Result Value Ref Range   pH, Ven 7.38 7.250 - 7.430   pCO2, Ven 47 44.0 - 60.0 mmHg   pO2, Ven 57.0 (H) 32.0 - 45.0 mmHg   Bicarbonate 27.8 20.0 - 28.0 mmol/L   Acid-Base Excess 2.0 0.0 - 2.0 mmol/L   O2 Saturation 88.6 %   Patient temperature 37.0    Sample type VENOUS   Procalcitonin  Result Value Ref Range   Procalcitonin <0.10 ng/mL  Hemoglobin A1c  Result Value Ref Range   Hgb A1c MFr Bld 6.5 (H) 4.8 - 5.6 %   Mean Plasma Glucose 139.85 mg/dL  Glucose, capillary  Result Value Ref Range   Glucose-Capillary 330 (H) 70 - 99 mg/dL  Glucose, capillary  Result Value Ref Range   Glucose-Capillary 235 (H) 70 - 99 mg/dL  Glucose, capillary  Result Value Ref Range   Glucose-Capillary 263 (H) 70 - 99 mg/dL  Glucose, capillary  Result Value Ref Range   Glucose-Capillary 163 (H) 70 - 99 mg/dL  Glucose, capillary  Result Value Ref Range   Glucose-Capillary 266 (H) 70 - 99 mg/dL  Glucose, capillary  Result Value Ref Range   Glucose-Capillary 246 (H) 70 - 99 mg/dL  CBC  Result Value Ref Range   WBC  12.1 (H) 4.0 - 10.5 K/uL   RBC 3.69 (L) 3.87 - 5.11 MIL/uL   Hemoglobin 12.0 12.0 - 15.0 g/dL   HCT 35.5 (L) 36.0 - 46.0 %   MCV 96.2 80.0 - 100.0 fL   MCH 32.5 26.0 - 34.0 pg   MCHC 33.8 30.0 - 36.0 g/dL   RDW 13.0 11.5 - 15.5 %   Platelets 202 150 - 400 K/uL   nRBC 0.0 0.0 - 0.2 %  Glucose, capillary  Result Value Ref Range   Glucose-Capillary 144 (H) 70 - 99 mg/dL  Glucose, capillary  Result Value Ref Range   Glucose-Capillary 230 (H) 70 - 99 mg/dL  Glucose, capillary  Result Value Ref Range   Glucose-Capillary 119 (H) 70 - 99 mg/dL  Glucose, capillary  Result Value Ref Range   Glucose-Capillary 118 (H) 70 - 99 mg/dL   Comment 1 Notify RN   Glucose, capillary  Result Value Ref Range   Glucose-Capillary 183 (H) 70 - 99 mg/dL  Glucose, capillary  Result Value Ref Range   Glucose-Capillary 254 (H) 70 - 99 mg/dL  Glucose, capillary  Result Value Ref Range   Glucose-Capillary 193 (H) 70 - 99 mg/dL  Glucose, capillary  Result Value Ref Range   Glucose-Capillary 99 70 - 99 mg/dL  Glucose, capillary  Result Value Ref Range   Glucose-Capillary 148 (H) 70 - 99 mg/dL  Glucose, capillary  Result Value Ref Range   Glucose-Capillary 280 (H) 70 - 99 mg/dL  Glucose, capillary  Result Value Ref Range   Glucose-Capillary 205 (H) 70 - 99 mg/dL   Comment 1 Notify RN   Glucose, capillary  Result Value Ref Range   Glucose-Capillary 105 (H) 70 - 99 mg/dL  Glucose, capillary  Result Value Ref Range   Glucose-Capillary 240 (H) 70 - 99 mg/dL  Hepatic function panel  Result Value Ref Range   Total Protein 6.5 6.5 - 8.1 g/dL   Albumin 3.7 3.5 - 5.0 g/dL   AST 36 15 - 41 U/L   ALT 34 0 - 44 U/L   Alkaline Phosphatase 62 38 - 126 U/L   Total Bilirubin 0.8 0.3 - 1.2 mg/dL   Bilirubin, Direct 0.1 0.0 - 0.2 mg/dL   Indirect Bilirubin 0.7 0.3 - 0.9 mg/dL  Sedimentation rate  Result Value Ref Range   Sed Rate 14 0 - 30 mm/hr  Rheumatoid factor  Result Value Ref Range   Rhuematoid  fact SerPl-aCnc 10.5 <14.0 IU/mL  C-reactive protein  Result Value Ref Range   CRP 0.6 <1.0 mg/dL  Glucose, capillary  Result Value Ref Range   Glucose-Capillary 227 (H) 70 - 99 mg/dL  CBC  Result Value Ref Range   WBC 10.8 (H) 4.0 - 10.5 K/uL   RBC 4.06 3.87 - 5.11 MIL/uL   Hemoglobin 13.2 12.0 - 15.0 g/dL   HCT 38.3 36.0 - 46.0 %   MCV 94.3 80.0 - 100.0 fL   MCH 32.5 26.0 - 34.0 pg   MCHC 34.5 30.0 - 36.0 g/dL   RDW 12.4 11.5 - 15.5 %   Platelets 205 150 - 400 K/uL   nRBC 0.0 0.0 - 0.2 %  Basic metabolic panel  Result Value Ref Range   Sodium 136 135 - 145 mmol/L   Potassium 4.2 3.5 - 5.1 mmol/L   Chloride 94 (L) 98 - 111 mmol/L   CO2 29 22 - 32 mmol/L   Glucose, Bld 241 (H) 70 - 99 mg/dL   BUN 44 (H) 8 - 23 mg/dL   Creatinine, Ser 1.16 (H) 0.44 - 1.00 mg/dL   Calcium 9.5 8.9 - 10.3 mg/dL   GFR, Estimated 47 (L) >60 mL/min   Anion gap 13 5 - 15  Glucose, capillary  Result Value Ref Range   Glucose-Capillary 244 (H) 70 - 99 mg/dL   Comment 1 Notify RN   Glucose, capillary  Result Value Ref Range   Glucose-Capillary 203 (H) 70 - 99 mg/dL  Glucose, capillary  Result Value Ref Range   Glucose-Capillary 106 (H) 70 - 99 mg/dL   Comment  1 Notify RN   ECHOCARDIOGRAM COMPLETE  Result Value Ref Range   Weight 3,280 oz   Height 63 in   BP 143/81 mmHg   Ao pk vel 1.52 m/s   AV Area VTI 2.67 cm2   AR max vel 2.44 cm2   AV Mean grad 5.0 mmHg   AV Peak grad 9.2 mmHg   S' Lateral 3.00 cm   AV Area mean vel 2.49 cm2   Area-P 1/2 4.04 cm2   MV VTI 2.93 cm2  Troponin I (High Sensitivity)  Result Value Ref Range   Troponin I (High Sensitivity) 41 (H) <18 ng/L  Troponin I (High Sensitivity)  Result Value Ref Range   Troponin I (High Sensitivity) 39 (H) <18 ng/L  Troponin I (High Sensitivity)  Result Value Ref Range   Troponin I (High Sensitivity) 37 (H) <18 ng/L      Assessment & Plan:   Problem List Items Addressed This Visit    PSVT (paroxysmal supraventricular  tachycardia) (HCC)   Relevant Medications   metoprolol succinate (TOPROL-XL) 25 MG 24 hr tablet   Centrilobular emphysema (HCC)    Other Visit Diagnoses    COPD with acute exacerbation ([place])    -  Primary      Hospital follow-up today improved overall AECOPD / Emphysema Significant exacerbation with hospitalization involving critical care PCCM involvement Gradual improvement on therapy Followed by [facility] Dr Raul Del. Last visit 4/14 - they changed Breo to Trelegy daily - for next 30 days, then re-evaluation Agree with current regimen, frequent albuterol, finishing long prednisone taper, and triple therapy trelegy  Today I do not believe she needs extended prednisone again or other antibiotic option  She will contact us or Dr Raul Del if breathing worsens again off of last dose prednisone, and return criteria given if not improved when and where to follow-up  #PSVT Will re order the half dose Metoprolol from 25 to 12.5mg  (half tab) new instructions to pharmacy, given by hospital.  #CKD Elevated creatinine, hospital recommend following this trend. We will check again at next blood draw in 3 months.  Meds ordered this encounter  Medications  . metoprolol succinate (TOPROL-XL) 25 MG 24 hr tablet    Sig: Take 0.5 tablets (12.5 mg total) by mouth daily.    Dispense:  45 tablet    Refill:  3    Add refills, dose was adjusted in hospital    Follow up plan: Return in about 3 months (around 08/07/2020) for keep 3 month apts.   Future labs 7/13 - A1c, Thyroid panel w/ TSH + ADD BMET   Nobie Putnam, DO [facility] 05/08/2020, 1:39 PM

## 2020-05-09 ENCOUNTER — Other Ambulatory Visit: Payer: Self-pay | Admitting: Family Medicine

## 2020-05-09 DIAGNOSIS — R600 Localized edema: Secondary | ICD-10-CM

## 2020-05-09 DIAGNOSIS — E1121 Type 2 diabetes mellitus with diabetic nephropathy: Secondary | ICD-10-CM

## 2020-05-09 DIAGNOSIS — R7989 Other specified abnormal findings of blood chemistry: Secondary | ICD-10-CM

## 2020-05-16 DIAGNOSIS — R296 Repeated falls: Secondary | ICD-10-CM | POA: Diagnosis not present

## 2020-05-16 DIAGNOSIS — R0602 Shortness of breath: Secondary | ICD-10-CM | POA: Diagnosis not present

## 2020-05-16 DIAGNOSIS — R262 Difficulty in walking, not elsewhere classified: Secondary | ICD-10-CM | POA: Diagnosis not present

## 2020-05-16 DIAGNOSIS — R531 Weakness: Secondary | ICD-10-CM | POA: Diagnosis not present

## 2020-05-19 DIAGNOSIS — R0602 Shortness of breath: Secondary | ICD-10-CM | POA: Diagnosis not present

## 2020-05-19 DIAGNOSIS — R296 Repeated falls: Secondary | ICD-10-CM | POA: Diagnosis not present

## 2020-05-19 DIAGNOSIS — R262 Difficulty in walking, not elsewhere classified: Secondary | ICD-10-CM | POA: Diagnosis not present

## 2020-05-19 DIAGNOSIS — R531 Weakness: Secondary | ICD-10-CM | POA: Diagnosis not present

## 2020-05-22 ENCOUNTER — Ambulatory Visit (INDEPENDENT_AMBULATORY_CARE_PROVIDER_SITE_OTHER): Payer: Medicare Other | Admitting: General Practice

## 2020-05-22 ENCOUNTER — Telehealth: Payer: Medicare Other | Admitting: General Practice

## 2020-05-22 DIAGNOSIS — E1169 Type 2 diabetes mellitus with other specified complication: Secondary | ICD-10-CM

## 2020-05-22 DIAGNOSIS — J441 Chronic obstructive pulmonary disease with (acute) exacerbation: Secondary | ICD-10-CM | POA: Diagnosis not present

## 2020-05-22 DIAGNOSIS — E785 Hyperlipidemia, unspecified: Secondary | ICD-10-CM | POA: Diagnosis not present

## 2020-05-22 DIAGNOSIS — R296 Repeated falls: Secondary | ICD-10-CM

## 2020-05-22 NOTE — Chronic Care Management (AMB) (Signed)
Chronic Care Management   CCM RN Visit Note  [id number] Name: Jenna [name] MRN: [id number] DOB: [id number]  Subjective: Jenna [name] is a 85 y.o. year old female who is a primary care patient of Olin Hauser, DO. The care management team was consulted for assistance with disease management and care coordination needs.    Engaged with patient by telephone for follow up visit in response to provider referral for case management and/or care coordination services.   Consent to Services:  The patient was given information about Chronic Care Management services, agreed to services, and gave verbal consent prior to initiation of services.  Please see initial visit note for detailed documentation.   Patient agreed to services and verbal consent obtained.   Assessment: Review of patient past medical history, allergies, medications, health status, including review of consultants reports, laboratory and other test data, was performed as part of comprehensive evaluation and provision of chronic care management services.   SDOH (Social Determinants of Health) assessments and interventions performed:    CCM Care Plan  Allergies  Allergen Reactions  . Azithromycin Other (See Comments)    Causes stomach burning pains  . Hydromorphone Other (See Comments)    confusion, personality change  . Morphine Other (See Comments)    Goes crazy  . Nsaids Other (See Comments)    Avoids because of gastric bypass surgery  . Oxycodone-Acetaminophen Nausea And Vomiting  . Codeine Other (See Comments) and Nausea And Vomiting    "will not stay down"  . Nabumetone Rash  . Oxycodone Nausea And Vomiting  . Oxycodone-Acetaminophen Nausea And Vomiting  . Promethazine Hcl Rash and Other (See Comments)    Outpatient Encounter Medications as of 05/22/2020  Medication Sig  . albuterol (PROVENTIL) (2.5 MG/3ML) 0.083% nebulizer solution Take 3 mLs (2.5 mg total) by nebulization every 4 (four) hours as  needed for wheezing or shortness of breath.  Marland Kitchen albuterol (VENTOLIN HFA) 108 (90 Base) MCG/ACT inhaler Inhale 2 puffs into the lungs every 6 (six) hours as needed for wheezing or shortness of breath.  Marland Kitchen alendronate (FOSAMAX) 70 MG tablet Take 1 tablet (70 mg total) by mouth once a week.  . Ascorbic Acid (VITAMIN C) 1000 MG tablet Take 500 mg by mouth daily.   Marland Kitchen BREO ELLIPTA 100-25 MCG/INH AEPB Inhale 1 puff into the lungs daily.  . Cholecalciferol 25 MCG (1000 UT) tablet Take 1,000 Units by mouth daily.   . citalopram (CELEXA) 40 MG tablet Take 1 tablet (40 mg total) by mouth daily.  . clotrimazole-betamethasone (LOTRISONE) cream Apply 1-2 times a day for worsening flare dry skin dermatitis of toes/feet, may re-use daily up to 1 week as needed.  . Cyanocobalamin 1000 MCG TBCR Take 1,000 mcg by mouth daily.   . diclofenac sodium (VOLTAREN) 1 % GEL Apply 2 g topically 3 (three) times daily as needed. For hands, arthritis  . famotidine (PEPCID) 20 MG tablet Take 20 mg by mouth 2 (two) times daily.  . ferrous sulfate 325 (65 FE) MG EC tablet Take 325 mg by mouth at bedtime.   . furosemide (LASIX) 20 MG tablet Take 1 tablet (20 mg total) by mouth daily.  Marland Kitchen ipratropium (ATROVENT) 0.02 % nebulizer solution Take 2.5 mLs (0.5 mg total) by nebulization every 6 (six) hours as needed for wheezing or shortness of breath.  . methocarbamol (ROBAXIN) 500 MG tablet Take 1 tablet (500 mg total) by mouth every 8 (eight) hours as needed for muscle spasms.  Marland Kitchen  metoprolol succinate (TOPROL-XL) 25 MG 24 hr tablet Take 0.5 tablets (12.5 mg total) by mouth daily.  . Multiple Vitamins-Minerals (ONE-A-DAY WOMENS PETITES) TABS Take 2 tablets by mouth every morning.  . Omega-3 Fatty Acids (FISH OIL) 1000 MG CAPS Take 1,000 mg by mouth daily.  . simvastatin (ZOCOR) 40 MG tablet Take 1 tablet (40 mg total) by mouth daily.  . traZODone (DESYREL) 50 MG tablet Take 1 tablet (50 mg total) by mouth at bedtime.   No  facility-administered encounter medications on file as of 05/22/2020.    Patient Active Problem List   Diagnosis Date Noted  . Gastric bypass status for obesity 04/22/2020  . COPD exacerbation ([place]) 04/22/2020  . Age-related osteoporosis without current pathological fracture 06/03/2019  . PSVT (paroxysmal supraventricular tachycardia) ([place]) 02/01/2019  . 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 Mild nonproliferative diabetic retinopathy of right eye without macular edema ([place]) 10/13/2018  . Controlled type 2 diabetes mellitus with diabetic nephropathy, without long-term current use of insulin ([place]) 11/13/2017  . Hyperlipidemia associated with type 2 diabetes mellitus ([place]) 11/13/2017  . Depression, major, recurrent ([place]) 11/12/2017  . Psychophysiological insomnia 11/12/2017  . Primary osteoarthritis of both hands 11/12/2017  . Primary osteoarthritis involving multiple joints 11/12/2017  . Centrilobular emphysema ([place]) 11/12/2017  . Obesity (BMI 35.0-39.9 without comorbidity) 11/13/2013  . Esophageal reflux 11/13/2013  . Bilateral leg edema 11/13/2013  . Chronic cough 11/13/2013  . Hx of adenomatous colonic polyps 10/15/2013    Conditions to be addressed/monitored:HLD, COPD and Falls prevention and Safety  Care Plan : RNCM: COPD (Adult)  Updates made by Vanita Ingles since 05/22/2020 12:00 AM    Problem: RNCM: Psychological Adjustment to Diagnosis (COPD)   Priority: Medium    Goal: RNCM: COPD Adjustment to Disease Achieved   Priority: Medium  Note:   Current Barriers:  Marland Kitchen Knowledge deficits related to basic understanding of COPD disease process . Knowledge deficits related to basic COPD self care/management . Knowledge deficit related to basic understanding of how to use inhalers and how inhaled medications work . Knowledge deficit related to importance of energy conservation . Limited Social Support . Unable to independently  manage COPD . Unable to self administer medications as prescribed . Does not contact provider office for questions/concerns  Case Manager Clinical Goal(s):  Over the next 120 days patient will report using inhalers as prescribed including rinsing mouth after use  Over the next 120 days patient will report utilizing pursed lip breathing for shortness of breath  Over the next 120 days, patient will be able to verbalize understanding of COPD action plan and when to seek appropriate levels of medical care  Over the next 120 days, patient will engage in lite exercise as tolerated to build/regain stamina and strength and reduce shortness of breath through activity tolerance  Over the next 120 days, patient will verbalize basic understanding of COPD disease process and self care activities  Over the next 120 days, patient will not be hospitalized for COPD exacerbation (Hospitalized from 04-22-2020 to 04-28-2020) Interventions:  . Collaboration with Olin Hauser, DO regarding development and update of comprehensive plan of care as evidenced by provider attestation and co-signature . Inter-disciplinary care team collaboration (see longitudinal plan of care)  Provided patient with basic written and verbal COPD education on self care/management/and exacerbation prevention. 05-22-2020: Review of COPD exacerbation and monitoring for changes. The patient had a recent hospitalization for COPD exacerbation from 04-22-2020 to  04-28-2020. She is doing much better now. Will continue to monitor for changes.   Provided patient with COPD action plan and reinforced importance of daily self assessment. 05-22-2020: The patient is going to PT and will see her pulmonology provider again on 05-31-2020. States the Trelegy seems to be working well for her at this time.   Provided written and verbal instructions on pursed lip breathing and utilized returned demonstration as teach back  Provided instruction about proper  use of medications used for management of COPD including inhalers. 05-22-2020: Review of new inhaler of Trelegy. The patient is doing well with the change. Denies any new concerns  Advised patient to self assesses COPD action plan zone and make appointment with provider if in the yellow zone for 48 hours without improvement.  Provided patient with education about the role of exercise in the management of COPD  Advised patient to engage in light exercise as tolerated 3-5 days a week  Provided education about and advised patient to utilize infection prevention strategies to reduce risk of respiratory infection. 05-22-2020: Review of risk factors that may contribute to COPD exacerbation. The patient states the pollen and allergies may have caused her to have exacerbation.  She is doing much better and getting stronger as she is taking PT as an outpatient.  Patient Goals/Self-Care Activities:  . - counseling provided . - decision-making supported . - depression screen reviewed . - emotional support provided . - problem-solving facilitated . - relaxation techniques promoted . - verbalization of feelings encouraged Follow Up Plan: Telephone follow up appointment with care management team member scheduled for: 07-31-2020 at 10:30 am  Timeframe:  Long-Range Goal Priority:  Medium Start Date:     02-03-2020                        Expected End Date:     09-19-2020                  Follow Up Date 07-31-2020   - develop a rescue plan - eliminate symptom triggers at home - follow rescue plan if symptoms flare-up - keep follow-up appointments - use an extra pillow to sleep    Why is this important?    Tracking your symptoms and other information about your health helps your doctor plan your care.   Write down the symptoms, the time of day, what you were doing and what medicine you are taking.   You will soon learn how to manage your symptoms.     Notes: The patient has a good understanding of  triggers to her COPD. 05-22-2020: The patient had a hospitalization for COPD exacerbation on 04-22-2020 to 04-28-2020. She is doing much better now. Medication changes have been helpful. She sees the pulmonologist on 05-31-2020.    Care Plan : RNCM: HLD Management  Updates made by Vanita Ingles since 05/22/2020 12:00 AM    Problem: RNCM: Health Promotion or Disease Self-Management (General Plan of Care)   Priority: Medium    Long-Range Goal: RNCM: [id number] Plan Developed   Priority: Medium  Note:   Current Barriers:  . Poorly controlled hyperlipidemia, complicated by COPD . Current antihyperlipidemic regimen: Zocor 40 mg daily  . Most recent lipid panel:     Component Value Date/Time   CHOL 170 01/31/2020 0756   TRIG 146 01/31/2020 0756   HDL 80 01/31/2020 0756   CHOLHDL 2.1 01/31/2020 0756   LDLCALC 67 01/31/2020 0756 .   Marland Kitchen  ASCVD risk enhancing conditions: age >51, COPD, Depression . Unable to independently manage HLD  . Does not contact provider office for questions/concerns  RN Care Manager Clinical Goal(s):  Marland Kitchen Over the next 120 days, patient will work with Consulting civil engineer, providers, and care team towards execution of optimized self-health management plan . Over the next 120 days, patient will verbalize understanding of plan for effective management of HLD . Over the next 120 days, patient will work with [facility] and pcp to address needs related to HLD . Over the next 120 days, patient will attend all scheduled medical appointments: 08-09-2020 at 1:40 pm  Interventions: . Collaboration with Olin Hauser, DO regarding development and update of comprehensive plan of care as evidenced by provider attestation and co-signature . Inter-disciplinary care team collaboration (see longitudinal plan of care) . Medication review performed; medication list updated in electronic medical record.  Bertram Savin care team collaboration (see longitudinal plan of  care) . Referred to pharmacy team for assistance with HLD medication management . Evaluation of current treatment plan related to HLD and patient's adherence to plan as established by provider. 05-22-2020: The patient is doing well and denies any new concerns. States she is compliant with heart healthy diet and medications.  . Advised patient to call the office for changes in condition related to HLD or new concerns . Provided education to patient re: heart healthy diet. 05-22-2020: The patient states that she is compliant and eating well denies any issues with dietary restrictions.  . Reviewed medications with patient and discussed compliance. 05-22-2020: The patient is compliant with medications.  . Discussed plans with patient for ongoing care management follow up and provided patient with direct contact information for care management team . Reviewed scheduled/upcoming provider appointments including: 08-09-2020 at 1:40 pm Patient Goals/Self-Care Activities: . Over the next 120 days, patient will:   - call for medicine refill 2 or 3 days before it runs out - call if I am sick and can't take my medicine - keep a list of all the medicines I take; vitamins and herbals too - learn to read medicine labels - use a pillbox to sort medicine - use an alarm clock or phone to remind me to take my medicine - drink 6 to 8 glasses of water each day - eat 3 to 5 servings of fruits and vegetables each day - eat 5 or 6 small meals each day - limit fast food meals to no more than 1 per week - manage portion size - prepare main meal at home 3 to 5 days each week - read food labels for fat, fiber, carbohydrates and portion size - set a realistic goal - be open to making changes - I can manage, know and watch for signs of a heart attack - if I have chest pain, call for help - learn about small changes that will make a big difference - learn my personal risk factors   Follow Up Plan: Telephone follow up  appointment with care management team member scheduled for: 07-31-2020 at [place] am     Care Plan : RNCM: Falls prevention and safety  Updates made by Vanita Ingles since 05/22/2020 12:00 AM    Problem: RNCM: Falls Prevention, Home and Family Safety (Wellness)   Priority: High  Note:   Last fall on 01-29-2020   Goal: RNCM: Falls, Home and Family Safety Maintained   Start Date: 02/03/2020  Priority: High  Note:   Current Barriers:  .  Knowledge Deficits related to fall precautions in patient with multiple chronic conditions and decreased mobility . Decreased adherence to prescribed treatment for fall prevention . Unable to independently manage falls in the home setting . Does not adhere to provider recommendations re: use of DME in the home setting  . Does not contact provider office for questions/concerns . Knowledge Deficits related to fall preventions and safety in the home . Does not use DME in the home, high fall risk patient Clinical Goal(s):  Marland Kitchen Over the next 120 days, patient will demonstrate improved adherence to prescribed treatment plan for decreasing falls as evidenced by patient reporting and review of EMR . Over the next 120 days, patient will verbalize using fall risk reduction strategies discussed . Over the next 120 days, patient will not experience additional falls . Over the next 120 days, patient will verbalize understanding of plan for having a safety plan in place at home to prevent new falls  . Over the next 120 days, patient will work with [facility], CCM team and pcp to address needs related to factors that may be contributing to falls in the home . Over the next 120 days, patient will demonstrate a decrease in fall exacerbations as evidenced by no new falls or safety concerns  . Over the next 120 days, patient will attend all scheduled medical appointments: 08-09-2020 at 1:40 pm . Over the next 120 days, patient will demonstrate understanding of rationale for each prescribed  medication as evidenced by compliance and working with pharmacist if needed to understand medications and review of high safety medication concerns Interventions:  . Collaboration with Olin Hauser, DO regarding development and update of comprehensive plan of care as evidenced by provider attestation and co-signature . Inter-disciplinary care team collaboration (see longitudinal plan of care) . Provided written and verbal education re: Potential causes of falls and Fall prevention strategies . Reviewed medications and discussed potential side effects of medications such as dizziness and frequent urination . Assessed for s/s of orthostatic hypotension . Assessed for falls since last encounter. 05-22-2020: The patient denies any new falls. The patient is currently working with Outpatient Physical therapy 2 times a week for strengthening post hospitalization.  . Assessed patients knowledge of fall risk prevention secondary to previously provided education. . Assessed working status of life alert bracelet and patient adherence . Provided patient information for fall alert systems . Evaluation of current treatment plan related to falls and safety and patient's adherence to plan as established by provider. 05-22-2020: The patient is being safe and denies any new concerns related to safety and falls prevention. Will continue to monitor.  . Advised patient to call the office for new falls and discuss falls with the pcp at upcoming visit  . Provided education to patient re: safety in the home and the use of DME to help with safety in the home  . Reviewed medications with patient and discussed compliance and review of high risk medications  . Discussed plans with patient for ongoing care management follow up and provided patient with direct contact information for care management team . Reviewed scheduled/upcoming provider appointments including: 08-09-2020 at 1:40 pm  Patient Goals/Self-Care  Activities . Over the next 120 days, patient will:   - Utilize walker, cane, or crutches (assistive device) appropriately with all ambulation - De-clutter walkways - Change positions slowly - Wear secure fitting shoes at all times with ambulation - Utilize home lighting for dim lit areas - Demonstrate self and pet awareness  at all times - plan for unforeseen emergencies (disaster) reviewed - resources needed to improve safety identified - risks to home and environmental safety identified - strategies to improve or maintain safety promoted Follow Up Plan: Telephone follow up appointment with care management team member scheduled for: 07-31-2020 at 54 am     Plan:Telephone follow up appointment with care management team member scheduled for:  07-31-2020 at 59 am  [place], MSN, [place] Mobile: [phone]

## 2020-05-22 NOTE — Patient Instructions (Signed)
Visit Information  PATIENT GOALS: Goals Addressed            This Visit's Progress   . RNCM: Track and Manage My Symptoms-COPD       Timeframe:  Long-Range Goal Priority:  Medium Start Date:     02-03-2020                        Expected End Date:     09-19-2020                  Follow Up Date 07-31-2020   - develop a rescue plan - eliminate symptom triggers at home - follow rescue plan if symptoms flare-up - keep follow-up appointments - use an extra pillow to sleep    Why is this important?    Tracking your symptoms and other information about your health helps your doctor plan your care.   Write down the symptoms, the time of day, what you were doing and what medicine you are taking.   You will soon learn how to manage your symptoms.     Notes: The patient has a good understanding of triggers to her COPD. 05-22-2020: The patient had a hospitalization for COPD exacerbation on 04-22-2020 to 04-28-2020. She is doing much better now. Medication changes have been helpful. She sees the pulmonologist on 05-31-2020.       Patient Care Plan: RNCM: COPD (Adult)    Problem Identified: RNCM: Psychological Adjustment to Diagnosis (COPD)   Priority: Medium    Goal: RNCM: COPD Adjustment to Disease Achieved   Priority: Medium  Note:   Current Barriers:  Marland Kitchen Knowledge deficits related to basic understanding of COPD disease process . Knowledge deficits related to basic COPD self care/management . Knowledge deficit related to basic understanding of how to use inhalers and how inhaled medications work . Knowledge deficit related to importance of energy conservation . Limited Social Support . Unable to independently manage COPD . Unable to self administer medications as prescribed . Does not contact provider office for questions/concerns  Case Manager Clinical Goal(s):  Over the next 120 days patient will report using inhalers as prescribed including rinsing mouth after use  Over the next  120 days patient will report utilizing pursed lip breathing for shortness of breath  Over the next 120 days, patient will be able to verbalize understanding of COPD action plan and when to seek appropriate levels of medical care  Over the next 120 days, patient will engage in lite exercise as tolerated to build/regain stamina and strength and reduce shortness of breath through activity tolerance  Over the next 120 days, patient will verbalize basic understanding of COPD disease process and self care activities  Over the next 120 days, patient will not be hospitalized for COPD exacerbation (Hospitalized from 04-22-2020 to 04-28-2020) Interventions:  . Collaboration with Olin Hauser, DO regarding development and update of comprehensive plan of care as evidenced by provider attestation and co-signature . Inter-disciplinary care team collaboration (see longitudinal plan of care)  Provided patient with basic written and verbal COPD education on self care/management/and exacerbation prevention. 05-22-2020: Review of COPD exacerbation and monitoring for changes. The patient had a recent hospitalization for COPD exacerbation from 04-22-2020 to 04-28-2020. She is doing much better now. Will continue to monitor for changes.   Provided patient with COPD action plan and reinforced importance of daily self assessment. 05-22-2020: The patient is going to PT and will see her pulmonology provider again  on 05-31-2020. States the Trelegy seems to be working well for her at this time.   Provided written and verbal instructions on pursed lip breathing and utilized returned demonstration as teach back  Provided instruction about proper use of medications used for management of COPD including inhalers. 05-22-2020: Review of new inhaler of Trelegy. The patient is doing well with the change. Denies any new concerns  Advised patient to self assesses COPD action plan zone and make appointment with provider if in the yellow  zone for 48 hours without improvement.  Provided patient with education about the role of exercise in the management of COPD  Advised patient to engage in light exercise as tolerated 3-5 days a week  Provided education about and advised patient to utilize infection prevention strategies to reduce risk of respiratory infection. 05-22-2020: Review of risk factors that may contribute to COPD exacerbation. The patient states the pollen and allergies may have caused her to have exacerbation.  She is doing much better and getting stronger as she is taking PT as an outpatient.  Patient Goals/Self-Care Activities:  . - counseling provided . - decision-making supported . - depression screen reviewed . - emotional support provided . - problem-solving facilitated . - relaxation techniques promoted . - verbalization of feelings encouraged Follow Up Plan: Telephone follow up appointment with care management team member scheduled for: 07-31-2020 at 10:30 am  Timeframe:  Long-Range Goal Priority:  Medium Start Date:     02-03-2020                        Expected End Date:     09-19-2020                  Follow Up Date 07-31-2020   - develop a rescue plan - eliminate symptom triggers at home - follow rescue plan if symptoms flare-up - keep follow-up appointments - use an extra pillow to sleep    Why is this important?    Tracking your symptoms and other information about your health helps your doctor plan your care.   Write down the symptoms, the time of day, what you were doing and what medicine you are taking.   You will soon learn how to manage your symptoms.     Notes: The patient has a good understanding of triggers to her COPD. 05-22-2020: The patient had a hospitalization for COPD exacerbation on 04-22-2020 to 04-28-2020. She is doing much better now. Medication changes have been helpful. She sees the pulmonologist on 05-31-2020.    Task: RNCM: Support Psychosocial Response to Chronic Obstructive  Pulmonary Disease   Note:   Care Management Activities:    - counseling provided - decision-making supported - depression screen reviewed - emotional support provided - problem-solving facilitated - relaxation techniques promoted - verbalization of feelings encouraged       Patient Care Plan: RNCM: HLD Management    Problem Identified: RNCM: Health Promotion or Disease Self-Management (General Plan of Care)   Priority: Medium    Long-Range Goal: RNCM: [id number] Plan Developed   Priority: Medium  Note:   Current Barriers:  . Poorly controlled hyperlipidemia, complicated by COPD . Current antihyperlipidemic regimen: Zocor 40 mg daily  . Most recent lipid panel:     Component Value Date/Time   CHOL 170 01/31/2020 0756   TRIG 146 01/31/2020 0756   HDL 80 01/31/2020 0756   CHOLHDL 2.1 01/31/2020 0756   LDLCALC 67 01/31/2020 0756 .   Marland Kitchen  ASCVD risk enhancing conditions: age >26, COPD, Depression . Unable to independently manage HLD  . Does not contact provider office for questions/concerns  RN Care Manager Clinical Goal(s):  Marland Kitchen Over the next 120 days, patient will work with Consulting civil engineer, providers, and care team towards execution of optimized self-health management plan . Over the next 120 days, patient will verbalize understanding of plan for effective management of HLD . Over the next 120 days, patient will work with [facility] and pcp to address needs related to HLD . Over the next 120 days, patient will attend all scheduled medical appointments: 08-09-2020 at 1:40 pm  Interventions: . Collaboration with Olin Hauser, DO regarding development and update of comprehensive plan of care as evidenced by provider attestation and co-signature . Inter-disciplinary care team collaboration (see longitudinal plan of care) . Medication review performed; medication list updated in electronic medical record.  Bertram Savin care team collaboration (see longitudinal  plan of care) . Referred to pharmacy team for assistance with HLD medication management . Evaluation of current treatment plan related to HLD and patient's adherence to plan as established by provider. 05-22-2020: The patient is doing well and denies any new concerns. States she is compliant with heart healthy diet and medications.  . Advised patient to call the office for changes in condition related to HLD or new concerns . Provided education to patient re: heart healthy diet. 05-22-2020: The patient states that she is compliant and eating well denies any issues with dietary restrictions.  . Reviewed medications with patient and discussed compliance. 05-22-2020: The patient is compliant with medications.  . Discussed plans with patient for ongoing care management follow up and provided patient with direct contact information for care management team . Reviewed scheduled/upcoming provider appointments including: 08-09-2020 at 1:40 pm Patient Goals/Self-Care Activities: . Over the next 120 days, patient will:   - call for medicine refill 2 or 3 days before it runs out - call if I am sick and can't take my medicine - keep a list of all the medicines I take; vitamins and herbals too - learn to read medicine labels - use a pillbox to sort medicine - use an alarm clock or phone to remind me to take my medicine - drink 6 to 8 glasses of water each day - eat 3 to 5 servings of fruits and vegetables each day - eat 5 or 6 small meals each day - limit fast food meals to no more than 1 per week - manage portion size - prepare main meal at home 3 to 5 days each week - read food labels for fat, fiber, carbohydrates and portion size - set a realistic goal - be open to making changes - I can manage, know and watch for signs of a heart attack - if I have chest pain, call for help - learn about small changes that will make a big difference - learn my personal risk factors   Follow Up Plan: Telephone follow  up appointment with care management team member scheduled for: 07-31-2020 at [place] am     Patient Care Plan: RNCM: Falls prevention and safety    Problem Identified: RNCM: Falls Prevention, Home and Family Safety (Wellness)   Priority: High  Note:   Last fall on 01-29-2020   Goal: RNCM: Falls, Home and Family Safety Maintained   Start Date: 02/03/2020  Priority: High  Note:   Current Barriers:  Marland Kitchen Knowledge Deficits related to fall precautions in patient with multiple  chronic conditions and decreased mobility . Decreased adherence to prescribed treatment for fall prevention . Unable to independently manage falls in the home setting . Does not adhere to provider recommendations re: use of DME in the home setting  . Does not contact provider office for questions/concerns . Knowledge Deficits related to fall preventions and safety in the home . Does not use DME in the home, high fall risk patient Clinical Goal(s):  Marland Kitchen Over the next 120 days, patient will demonstrate improved adherence to prescribed treatment plan for decreasing falls as evidenced by patient reporting and review of EMR . Over the next 120 days, patient will verbalize using fall risk reduction strategies discussed . Over the next 120 days, patient will not experience additional falls . Over the next 120 days, patient will verbalize understanding of plan for having a safety plan in place at home to prevent new falls  . Over the next 120 days, patient will work with [facility], CCM team and pcp to address needs related to factors that may be contributing to falls in the home . Over the next 120 days, patient will demonstrate a decrease in fall exacerbations as evidenced by no new falls or safety concerns  . Over the next 120 days, patient will attend all scheduled medical appointments: 08-09-2020 at 1:40 pm . Over the next 120 days, patient will demonstrate understanding of rationale for each prescribed medication as evidenced by  compliance and working with pharmacist if needed to understand medications and review of high safety medication concerns Interventions:  . Collaboration with Olin Hauser, DO regarding development and update of comprehensive plan of care as evidenced by provider attestation and co-signature . Inter-disciplinary care team collaboration (see longitudinal plan of care) . Provided written and verbal education re: Potential causes of falls and Fall prevention strategies . Reviewed medications and discussed potential side effects of medications such as dizziness and frequent urination . Assessed for s/s of orthostatic hypotension . Assessed for falls since last encounter. 05-22-2020: The patient denies any new falls. The patient is currently working with Outpatient Physical therapy 2 times a week for strengthening post hospitalization.  . Assessed patients knowledge of fall risk prevention secondary to previously provided education. . Assessed working status of life alert bracelet and patient adherence . Provided patient information for fall alert systems . Evaluation of current treatment plan related to falls and safety and patient's adherence to plan as established by provider. 05-22-2020: The patient is being safe and denies any new concerns related to safety and falls prevention. Will continue to monitor.  . Advised patient to call the office for new falls and discuss falls with the pcp at upcoming visit  . Provided education to patient re: safety in the home and the use of DME to help with safety in the home  . Reviewed medications with patient and discussed compliance and review of high risk medications  . Discussed plans with patient for ongoing care management follow up and provided patient with direct contact information for care management team . Reviewed scheduled/upcoming provider appointments including: 08-09-2020 at 1:40 pm  Patient Goals/Self-Care Activities . Over the next 120  days, patient will:   - Utilize walker, cane, or crutches (assistive device) appropriately with all ambulation - De-clutter walkways - Change positions slowly - Wear secure fitting shoes at all times with ambulation - Utilize home lighting for dim lit areas - Demonstrate self and pet awareness at all times - plan for unforeseen emergencies (disaster) reviewed -  resources needed to improve safety identified - risks to home and environmental safety identified - strategies to improve or maintain safety promoted Follow Up Plan: Telephone follow up appointment with care management team member scheduled for: 07-31-2020 at 67 am   Task: RNCM: Identify and Reduce Risk to Safety   Note:   Care Management Activities:    - plan for unforeseen emergencies (disaster) reviewed - resources needed to improve safety identified - risks to home and environmental safety identified - strategies to improve or maintain safety promoted         Patient verbalizes understanding of instructions provided today and agrees to view in [place].   Telephone follow up appointment with care management team member scheduled for:07-31-2020 at 13 am  [place], MSN, [place] Mobile: [phone]

## 2020-05-23 DIAGNOSIS — R0602 Shortness of breath: Secondary | ICD-10-CM | POA: Diagnosis not present

## 2020-05-23 DIAGNOSIS — R531 Weakness: Secondary | ICD-10-CM | POA: Diagnosis not present

## 2020-05-23 DIAGNOSIS — R296 Repeated falls: Secondary | ICD-10-CM | POA: Diagnosis not present

## 2020-05-23 DIAGNOSIS — R262 Difficulty in walking, not elsewhere classified: Secondary | ICD-10-CM | POA: Diagnosis not present

## 2020-05-26 DIAGNOSIS — R531 Weakness: Secondary | ICD-10-CM | POA: Diagnosis not present

## 2020-05-26 DIAGNOSIS — R0602 Shortness of breath: Secondary | ICD-10-CM | POA: Diagnosis not present

## 2020-05-26 DIAGNOSIS — R296 Repeated falls: Secondary | ICD-10-CM | POA: Diagnosis not present

## 2020-05-26 DIAGNOSIS — R262 Difficulty in walking, not elsewhere classified: Secondary | ICD-10-CM | POA: Diagnosis not present

## 2020-05-30 ENCOUNTER — Encounter: Payer: Self-pay | Admitting: Cardiovascular Disease

## 2020-05-30 ENCOUNTER — Other Ambulatory Visit: Payer: Self-pay

## 2020-05-30 ENCOUNTER — Ambulatory Visit: Payer: Medicare Other | Admitting: Cardiovascular Disease

## 2020-05-30 DIAGNOSIS — R600 Localized edema: Secondary | ICD-10-CM

## 2020-05-30 DIAGNOSIS — I471 Supraventricular tachycardia: Secondary | ICD-10-CM | POA: Diagnosis not present

## 2020-05-30 MED ORDER — FUROSEMIDE 20 MG PO TABS: 20.0000 mg | ORAL_TABLET | Freq: Every day | ORAL | 1 refills | Status: DC | PRN

## 2020-05-30 MED ORDER — METOPROLOL SUCCINATE ER 25 MG PO TB24: 25.0000 mg | ORAL_TABLET | Freq: Every day | ORAL | 3 refills | Status: DC

## 2020-05-30 NOTE — Progress Notes (Signed)
Date:  6/57/8469   ID:  [name], DOB 01-16-1936, MRN [id number]  Patient Location:  [place] [address]   Provider location:   [facility], [place] office  PCP:  Olin Hauser, DO  Cardiologist:  Arvid Right [facility]  Chief Complaint  Patient presents with  . 12 month follow up     Patient c/o LE edema, rapid heart beats last night with a heart rate of 179 bpm, some shortness of breath and chest pressure. Medications reviewed by the patient verbally.     History of Present Illness:    Jenna [name] is a 85 y.o. female past medical history of obesity,  Hypertension, hyperlipidemia,  GERD,  chronic cough,  gastric bypass surgery,  depression,   chronic leg edema  Type 2 diabetes hemoglobin A1c 6.2 COPD Former smoker Stage III chronic kidney disease Stable chronic problem with mood, anxiety, secondary insomnia presents for evaluation of pericardial effusion seen on CT scan,  Palpitations  Presents today in a wheelchair  Recently in the hospital for treatment of COPD exacerbation Hospital records reviewed Normal echo 04/24/2020 reviewed Metoprolol reduced to 12.5 at discharge Steroids, ABX,   Since then has had increasing tachypalpitations, awake at night with symptoms Past several days have been very difficult  Sedentary, Feels her lungs are back to normal, no regular exercise program  Lab work reviewed HBA1C 6.3 Total chol 170  EKG personally reviewed by myself on todays visit Shows normal sinus rhythm with rate 73 bpm no significant ST-T wave changes  Prior monitor reviewed May 2020, showed normal sinus rhythm with 99 SVT runs lasting up to 4 minutes 30 secs, max rate of 210 bpm, and average rate 172 bpm.  Prior history of chronic cough. Her other big complaint didn't GERD symptoms. Several other family members have GERD as well. She has had chronic lower extremity edema, prior  trauma to her left lower extremity. She takes Lasix 20 mg daily with no significant improvement   Prior CV studies:   The following studies were reviewed today:   Past Medical History:  Diagnosis Date  . Anxiety   . Arthritis    neck, knees(before replacements)  . Asthma   . CKD (chronic kidney disease), stage III ([place])   . COPD (chronic obstructive pulmonary disease) ([place])   . Encounter for colonoscopy due to history of adenomatous colonic polyps   . GERD (gastroesophageal reflux disease)   . Hearing loss of both ears   . History of echocardiogram    a. 10/2013 Echo: EF 60-65%, no rwma, midlly dil LA. Rnl RV fxn.  . Hyperlipidemia   . Osteoporosis   . Palpitations   . PSVT (paroxysmal supraventricular tachycardia) ([place])    a. 05/2018 [facility]: 99 SVT runs. Fastest 218 x 4:30. Longest 4:38 w/ rate of 172.  . Sleep apnea    resolved with gastric bypass  . Syncope and collapse   . Urine incontinence    Past Surgical History:  Procedure Laterality Date  . BROW LIFT Bilateral 06/27/2015   Procedure: BLEPHAROPLASTY BILATERAL UPPER EYELIDS BILATERAL BLEPHAROTOSIS EYELIDS;  Surgeon: Karle Starch, MD;  Location: [facility];  Service: Ophthalmology;  Laterality: Bilateral;  BILATERAL  . CATARACT EXTRACTION W/PHACO Right 02/13/2015   Procedure: CATARACT EXTRACTION PHACO AND INTRAOCULAR LENS PLACEMENT (IOC);  Surgeon: Ronnell Freshwater, MD;  Location: [facility];  Service: Ophthalmology;  Laterality: Right;  .  CATARACT EXTRACTION W/PHACO Left 03/22/2015   Procedure: CATARACT EXTRACTION PHACO AND INTRAOCULAR LENS PLACEMENT (IOC);  Surgeon: Ronnell Freshwater, MD;  Location: [facility];  Service: Ophthalmology;  Laterality: Left;  TORIC  . CHOLECYSTECTOMY  1984  . COLECTOMY    . COSMETIC SURGERY  2012   tummy tuck and excess skin removal  . GASTRIC BYPASS  2010  . REPLACEMENT TOTAL KNEE BILATERAL  1998  . SHOULDER SURGERY  2009   left   . TONSILLECTOMY     . TOTAL ABDOMINAL HYSTERECTOMY  1979     Current Outpatient Medications on File Prior to Visit  Medication Sig Dispense Refill  . albuterol (PROVENTIL) (2.5 MG/3ML) 0.083% nebulizer solution Take 3 mLs (2.5 mg total) by nebulization every 4 (four) hours as needed for wheezing or shortness of breath. 75 mL 2  . albuterol (VENTOLIN HFA) 108 (90 Base) MCG/ACT inhaler Inhale 2 puffs into the lungs every 6 (six) hours as needed for wheezing or shortness of breath. 1 each 2  . alendronate (FOSAMAX) 70 MG tablet Take 1 tablet (70 mg total) by mouth once a week. 12 tablet 1  . Ascorbic Acid (VITAMIN C) 1000 MG tablet Take 500 mg by mouth daily.     Marland Kitchen BREO ELLIPTA 100-25 MCG/INH AEPB Inhale 1 puff into the lungs daily. 90 each 0  . Cholecalciferol 25 MCG (1000 UT) tablet Take 1,000 Units by mouth daily.     . citalopram (CELEXA) 40 MG tablet Take 1 tablet (40 mg total) by mouth daily. 90 tablet 1  . clotrimazole-betamethasone (LOTRISONE) cream Apply 1-2 times a day for worsening flare dry skin dermatitis of toes/feet, may re-use daily up to 1 week as needed. 30 g 0  . Cyanocobalamin 1000 MCG TBCR Take 1,000 mcg by mouth daily.     . diclofenac sodium (VOLTAREN) 1 % GEL Apply 2 g topically 3 (three) times daily as needed. For hands, arthritis 100 g 2  . famotidine (PEPCID) 20 MG tablet Take 20 mg by mouth 2 (two) times daily.    . ferrous sulfate 325 (65 FE) MG EC tablet Take 325 mg by mouth at bedtime.     . furosemide (LASIX) 20 MG tablet Take 1 tablet (20 mg total) by mouth daily. 90 tablet 1  . ipratropium (ATROVENT) 0.02 % nebulizer solution Take 2.5 mLs (0.5 mg total) by nebulization every 6 (six) hours as needed for wheezing or shortness of breath. 900 mL 0  . methocarbamol (ROBAXIN) 500 MG tablet Take 1 tablet (500 mg total) by mouth every 8 (eight) hours as needed for muscle spasms. 60 tablet 2  . metoprolol succinate (TOPROL-XL) 25 MG 24 hr tablet Take 0.5 tablets (12.5 mg total) by mouth  daily. 45 tablet 3  . Multiple Vitamins-Minerals (ONE-A-DAY WOMENS PETITES) TABS Take 2 tablets by mouth every morning.    . Omega-3 Fatty Acids (FISH OIL) 1000 MG CAPS Take 1,000 mg by mouth daily.    . simvastatin (ZOCOR) 40 MG tablet Take 1 tablet (40 mg total) by mouth daily. 90 tablet 1  . traZODone (DESYREL) 50 MG tablet Take 1 tablet (50 mg total) by mouth at bedtime. 30 tablet 5   No current facility-administered medications on file prior to visit.     Allergies:   Azithromycin, Hydromorphone, Morphine, Nsaids, Oxycodone-acetaminophen, Codeine, Nabumetone, Oxycodone, Oxycodone-acetaminophen, and Promethazine hcl   Social History   Tobacco Use  . Smoking status: Former Smoker    Packs/day: 1.00  Years: 20.00    Pack years: 20.00    Types: Cigarettes    Quit date: 07/1981    Years since quitting: 38.8  . Smokeless tobacco: Former Network engineer Use Topics  . Alcohol use: Not Currently    Comment: past  . Drug use: No      Family Hx: The patient's family history includes Colon cancer in her sister; Diabetes type II in her father and mother; Heart attack in her brother and mother; Heart attack (age of onset: 77) in her sister; Hyperlipidemia in her sister; Hypertension in her mother and sister; Ovarian cancer in her sister; Pneumonia in her father; Skin cancer in her father.  ROS:   Please see the history of present illness.    Review of Systems  Constitutional: Negative.   HENT: Negative.   Respiratory: Negative.   Cardiovascular: Positive for palpitations.  Gastrointestinal: Negative.   Musculoskeletal: Negative.   Neurological: Negative.   Psychiatric/Behavioral: Negative.   All other systems reviewed and are negative.    Labs/Other Tests and Data Reviewed:    Recent Labs: 01/31/2020: TSH 6.08 04/22/2020: B Natriuretic Peptide 138.4 04/27/2020: ALT 34 04/28/2020: BUN 44; Creatinine, Ser 1.16; Hemoglobin 13.2; Platelets 205; Potassium 4.2; Sodium 136   Recent  Lipid Panel Lab Results  Component Value Date/Time   CHOL 170 01/31/2020 07:56 AM   TRIG 146 01/31/2020 07:56 AM   HDL 80 01/31/2020 07:56 AM   CHOLHDL 2.1 01/31/2020 07:56 AM   LDLCALC 67 01/31/2020 07:56 AM    Wt Readings from Last 3 Encounters:  05/30/20 215 lb 8 oz (97.8 kg)  05/08/20 208 lb 3.2 oz (94.4 kg)  04/22/20 205 lb (93 kg)     Exam:    BP 120/64 (BP Location: Right Wrist, Patient Position: Sitting, Cuff Size: Normal)   Pulse 73   Ht 5\' 3"  (1.6 m)   Wt 215 lb 8 oz (97.8 kg)   SpO2 98%   BMI 38.17 kg/m  Constitutional:  oriented to person, place, and time. No distress.  HENT:  Head: Grossly normal Eyes:  no discharge. No scleral icterus.  Neck: No JVD, no carotid bruits  Cardiovascular: Regular rate and rhythm, no murmurs appreciated Pulmonary/Chest: Clear to auscultation bilaterally, no wheezes or rails Abdominal: Soft.  no distension.  no tenderness.  Musculoskeletal: Normal range of motion Neurological:  normal muscle tone. Coordination normal. No atrophy Skin: Skin warm and dry Psychiatric: normal affect, pleasant   ASSESSMENT & PLAN:    Paroxysmal tachycardia ([place]) Having breakthrough arrhythmia, recommend she increase metoprolol succinate back to 25 mg daily May need additional half doses if arrhythmia persists Symptoms started when metoprolol succinate dose was lowered at discharge from hospital  Centrilobular emphysema ([place]) Breathing stable, uses inhalers as needed Recommended walking program, weight loss  Controlled type 2 diabetes mellitus with diabetic nephropathy, without long-term current use of insulin (HCC) Less active over the past year Hemoglobin A1c 6.1  Leg swelling Likely lymphedema, recommended Ace wraps if needed Unable to put compression hose on Recommend she consider discussion with vein and vascular for lymphedema compression pumps  Hyperlipidemia associated with type 2 diabetes mellitus ([place]) Cholesterol is at goal on  the current lipid regimen. No changes to the medications were made.  Essential hypertension Blood pressure is well controlled on today's visit. No changes made to the medications.  Walking program, lifestyle modification  Smoker Stopped smoking many years ago Still with residual COPD   Total encounter time more than 35  minutes  Greater than 50% was spent in counseling and coordination of care with the patient     Signed, Ida Rogue, MD  05/30/2020 2:37 PM    [place] [facility] [id number], [place], [place] [address]

## 2020-05-30 NOTE — Patient Instructions (Addendum)
Let me know if you would like to see Dr. Lucky Cowboy, for lymphedema compression pumps   Medication Instructions:  Metoprolol Succinate was increase back to 25 mg pill in the pm  Take extra (1/2 to whole pill) as needed in the morning for palpitations  Take lasix daily, with extra lasix as needed for swelling  If you need a refill on your cardiac medications before your next appointment, please call your pharmacy.    Lab work: No new labs needed  Testing/Procedures: No new testing needed  Follow-Up:  . You will need a follow up appointment in 12 months  . Providers on your designated Care Team:   . Murray Hodgkins, NP . Christell Faith, PA-C . Marrianne Mood, PA-C  COVID-19 Vaccine Information can be found at: [email or web address].uk For questions related to vaccine distribution or appointments, please email [email or web address] or call [phone].

## 2020-05-31 DIAGNOSIS — R0600 Dyspnea, unspecified: Secondary | ICD-10-CM | POA: Diagnosis not present

## 2020-05-31 DIAGNOSIS — J439 Emphysema, unspecified: Secondary | ICD-10-CM | POA: Diagnosis not present

## 2020-06-02 ENCOUNTER — Other Ambulatory Visit: Payer: Self-pay

## 2020-06-02 DIAGNOSIS — M85852 Other specified disorders of bone density and structure, left thigh: Secondary | ICD-10-CM

## 2020-06-02 DIAGNOSIS — R262 Difficulty in walking, not elsewhere classified: Secondary | ICD-10-CM | POA: Diagnosis not present

## 2020-06-02 DIAGNOSIS — R531 Weakness: Secondary | ICD-10-CM | POA: Diagnosis not present

## 2020-06-02 DIAGNOSIS — R0602 Shortness of breath: Secondary | ICD-10-CM | POA: Diagnosis not present

## 2020-06-02 DIAGNOSIS — R296 Repeated falls: Secondary | ICD-10-CM | POA: Diagnosis not present

## 2020-06-02 MED ORDER — ALENDRONATE SODIUM 70 MG PO TABS: 70.0000 mg | ORAL_TABLET | ORAL | 0 refills | Status: DC

## 2020-06-07 DIAGNOSIS — R0602 Shortness of breath: Secondary | ICD-10-CM | POA: Diagnosis not present

## 2020-06-07 DIAGNOSIS — R262 Difficulty in walking, not elsewhere classified: Secondary | ICD-10-CM | POA: Diagnosis not present

## 2020-06-07 DIAGNOSIS — R531 Weakness: Secondary | ICD-10-CM | POA: Diagnosis not present

## 2020-06-07 DIAGNOSIS — R296 Repeated falls: Secondary | ICD-10-CM | POA: Diagnosis not present

## 2020-06-20 DIAGNOSIS — R296 Repeated falls: Secondary | ICD-10-CM | POA: Diagnosis not present

## 2020-06-20 DIAGNOSIS — R262 Difficulty in walking, not elsewhere classified: Secondary | ICD-10-CM | POA: Diagnosis not present

## 2020-06-20 DIAGNOSIS — R531 Weakness: Secondary | ICD-10-CM | POA: Diagnosis not present

## 2020-06-20 DIAGNOSIS — R0602 Shortness of breath: Secondary | ICD-10-CM | POA: Diagnosis not present

## 2020-06-23 DIAGNOSIS — R0602 Shortness of breath: Secondary | ICD-10-CM | POA: Diagnosis not present

## 2020-06-23 DIAGNOSIS — R296 Repeated falls: Secondary | ICD-10-CM | POA: Diagnosis not present

## 2020-06-23 DIAGNOSIS — R262 Difficulty in walking, not elsewhere classified: Secondary | ICD-10-CM | POA: Diagnosis not present

## 2020-06-23 DIAGNOSIS — R531 Weakness: Secondary | ICD-10-CM | POA: Diagnosis not present

## 2020-06-26 DIAGNOSIS — R0602 Shortness of breath: Secondary | ICD-10-CM | POA: Diagnosis not present

## 2020-06-26 DIAGNOSIS — R262 Difficulty in walking, not elsewhere classified: Secondary | ICD-10-CM | POA: Diagnosis not present

## 2020-06-26 DIAGNOSIS — R531 Weakness: Secondary | ICD-10-CM | POA: Diagnosis not present

## 2020-06-26 DIAGNOSIS — R296 Repeated falls: Secondary | ICD-10-CM | POA: Diagnosis not present

## 2020-07-04 DIAGNOSIS — R296 Repeated falls: Secondary | ICD-10-CM | POA: Diagnosis not present

## 2020-07-04 DIAGNOSIS — R0602 Shortness of breath: Secondary | ICD-10-CM | POA: Diagnosis not present

## 2020-07-04 DIAGNOSIS — R262 Difficulty in walking, not elsewhere classified: Secondary | ICD-10-CM | POA: Diagnosis not present

## 2020-07-04 DIAGNOSIS — R531 Weakness: Secondary | ICD-10-CM | POA: Diagnosis not present

## 2020-07-07 DIAGNOSIS — R262 Difficulty in walking, not elsewhere classified: Secondary | ICD-10-CM | POA: Diagnosis not present

## 2020-07-07 DIAGNOSIS — R0602 Shortness of breath: Secondary | ICD-10-CM | POA: Diagnosis not present

## 2020-07-07 DIAGNOSIS — R296 Repeated falls: Secondary | ICD-10-CM | POA: Diagnosis not present

## 2020-07-07 DIAGNOSIS — R531 Weakness: Secondary | ICD-10-CM | POA: Diagnosis not present

## 2020-07-10 DIAGNOSIS — H35352 Cystoid macular degeneration, left eye: Secondary | ICD-10-CM | POA: Diagnosis not present

## 2020-07-10 DIAGNOSIS — H35372 Puckering of macula, left eye: Secondary | ICD-10-CM | POA: Diagnosis not present

## 2020-07-13 DIAGNOSIS — R0602 Shortness of breath: Secondary | ICD-10-CM | POA: Diagnosis not present

## 2020-07-13 DIAGNOSIS — R262 Difficulty in walking, not elsewhere classified: Secondary | ICD-10-CM | POA: Diagnosis not present

## 2020-07-13 DIAGNOSIS — R296 Repeated falls: Secondary | ICD-10-CM | POA: Diagnosis not present

## 2020-07-13 DIAGNOSIS — R531 Weakness: Secondary | ICD-10-CM | POA: Diagnosis not present

## 2020-07-18 ENCOUNTER — Ambulatory Visit (INDEPENDENT_AMBULATORY_CARE_PROVIDER_SITE_OTHER): Payer: Medicare Other

## 2020-07-18 VITALS — Ht 63.0 in | Wt 210.0 lb

## 2020-07-18 DIAGNOSIS — Z0000 Encounter for general adult medical examination without abnormal findings: Secondary | ICD-10-CM

## 2020-07-18 NOTE — Progress Notes (Signed)
I connected with Jenna [name] today by telephone and verified that I am speaking with the correct person using two identifiers. Location patient: home Location provider: work Persons participating in the virtual visit: Germaine Pomfret, Glenna Durand LPN.   I discussed the limitations, risks, security and privacy concerns of performing an evaluation and management service by telephone and the availability of in person appointments. I also discussed with the patient that there may be a patient responsible charge related to this service. The patient expressed understanding and verbally consented to this telephonic visit.    Interactive audio and video telecommunications were attempted between this provider and patient, however failed, due to patient having technical difficulties OR patient did not have access to video capability.  We continued and completed visit with audio only.     Vital signs may be patient reported or missing.  Subjective:   Jenna [name] is a 85 y.o. female who presents for Medicare Annual (Subsequent) preventive examination.  Review of Systems     Cardiac Risk Factors include: advanced age (>86men, >8 women);diabetes mellitus;dyslipidemia;obesity (BMI >30kg/m2)     Objective:    Today's Vitals   07/18/20 1317  Weight: 210 lb (95.3 kg)  Height: 5\' 3"  (1.6 m)   Body mass index is 37.2 kg/m.  Advanced Directives 07/18/2020 04/22/2020 04/20/2020 12/22/2018 09/24/2017 06/16/2016 06/16/2016  Does Patient Have a Medical Advance Directive? No No No No Yes No No  Would patient like information on creating a medical advance directive? - No - Patient declined No - Patient declined - - Yes (Inpatient - patient requests chaplain consult to create a medical advance directive) Yes (ED - Information included in AVS)    Current Medications (verified) Outpatient Encounter Medications as of 07/18/2020  Medication Sig   albuterol (PROVENTIL) (2.5 MG/3ML) 0.083% nebulizer solution Take 3  mLs (2.5 mg total) by nebulization every 4 (four) hours as needed for wheezing or shortness of breath.   albuterol (VENTOLIN HFA) 108 (90 Base) MCG/ACT inhaler Inhale 2 puffs into the lungs every 6 (six) hours as needed for wheezing or shortness of breath.   alendronate (FOSAMAX) 70 MG tablet Take 1 tablet (70 mg total) by mouth once a week.   Ascorbic Acid (VITAMIN C) 1000 MG tablet Take 500 mg by mouth daily.    BREO ELLIPTA 100-25 MCG/INH AEPB Inhale 1 puff into the lungs daily.   Cholecalciferol 25 MCG (1000 UT) tablet Take 1,000 Units by mouth daily.    citalopram (CELEXA) 40 MG tablet Take 1 tablet (40 mg total) by mouth daily.   clotrimazole-betamethasone (LOTRISONE) cream Apply 1-2 times a day for worsening flare dry skin dermatitis of toes/feet, may re-use daily up to 1 week as needed.   Cyanocobalamin 1000 MCG TBCR Take 1,000 mcg by mouth daily.    diclofenac sodium (VOLTAREN) 1 % GEL Apply 2 g topically 3 (three) times daily as needed. For hands, arthritis   famotidine (PEPCID) 20 MG tablet Take 20 mg by mouth 2 (two) times daily.   ferrous sulfate 325 (65 FE) MG EC tablet Take 325 mg by mouth at bedtime.    furosemide (LASIX) 20 MG tablet Take 1 tablet (20 mg total) by mouth daily as needed for fluid or edema. Take lasix daily, with extra dose as needed for swelling   ipratropium (ATROVENT) 0.02 % nebulizer solution Take 2.5 mLs (0.5 mg total) by nebulization every 6 (six) hours as needed for wheezing or shortness of breath.   methocarbamol (ROBAXIN) 500  MG tablet Take 1 tablet (500 mg total) by mouth every 8 (eight) hours as needed for muscle spasms.   metoprolol succinate (TOPROL-XL) 25 MG 24 hr tablet Take 1 tablet (25 mg total) by mouth daily. Take an additional 12.5 mg - 25 mg as needed for palpitations   Multiple Vitamins-Minerals (ONE-A-DAY WOMENS PETITES) TABS Take 2 tablets by mouth every morning.   Omega-3 Fatty Acids (FISH OIL) 1000 MG CAPS Take 1,000 mg by mouth daily.    simvastatin (ZOCOR) 40 MG tablet Take 1 tablet (40 mg total) by mouth daily.   traZODone (DESYREL) 50 MG tablet Take 1 tablet (50 mg total) by mouth at bedtime.   No facility-administered encounter medications on file as of 07/18/2020.    Allergies (verified) Azithromycin, Hydromorphone, Morphine, Nsaids, Oxycodone-acetaminophen, Codeine, Nabumetone, Oxycodone, Oxycodone-acetaminophen, and Promethazine hcl   History: Past Medical History:  Diagnosis Date   Anxiety    Arthritis    neck, knees(before replacements)   Asthma    CKD (chronic kidney disease), stage III (HCC)    COPD (chronic obstructive pulmonary disease) ([place])    Encounter for colonoscopy due to history of adenomatous colonic polyps    GERD (gastroesophageal reflux disease)    Hearing loss of both ears    History of echocardiogram    a. 10/2013 Echo: EF 60-65%, no rwma, midlly dil LA. Rnl RV fxn.   Hyperlipidemia    Osteoporosis    Palpitations    PSVT (paroxysmal supraventricular tachycardia) ([place])    a. 05/2018 [facility]: 99 SVT runs. Fastest 218 x 4:30. Longest 4:38 w/ rate of 172.   Sleep apnea    resolved with gastric bypass   Syncope and collapse    Urine incontinence    Past Surgical History:  Procedure Laterality Date   BROW LIFT Bilateral 06/27/2015   Procedure: BLEPHAROPLASTY BILATERAL UPPER EYELIDS BILATERAL BLEPHAROTOSIS EYELIDS;  Surgeon: Karle Starch, MD;  Location: [facility];  Service: Ophthalmology;  Laterality: Bilateral;  BILATERAL   CATARACT EXTRACTION W/PHACO Right 02/13/2015   Procedure: CATARACT EXTRACTION PHACO AND INTRAOCULAR LENS PLACEMENT (IOC);  Surgeon: Ronnell Freshwater, MD;  Location: [facility];  Service: Ophthalmology;  Laterality: Right;   CATARACT EXTRACTION W/PHACO Left 03/22/2015   Procedure: CATARACT EXTRACTION PHACO AND INTRAOCULAR LENS PLACEMENT (IOC);  Surgeon: Ronnell Freshwater, MD;  Location: [facility];  Service: Ophthalmology;  Laterality:  Left;  [place]   COLECTOMY     COSMETIC SURGERY  2012   tummy tuck and excess skin removal   GASTRIC BYPASS  2010   REPLACEMENT TOTAL KNEE BILATERAL  1998   SHOULDER SURGERY  2009   left    TONSILLECTOMY     TOTAL ABDOMINAL HYSTERECTOMY  1979   Family History  Problem Relation Age of Onset   Heart attack Mother    Hypertension Mother    Diabetes type II Mother    Pneumonia Father    Skin cancer Father    Diabetes type II Father    Colon cancer Sister    Ovarian cancer Sister    Heart attack Brother    Heart attack Sister 74   Hyperlipidemia Sister    Hypertension Sister    Social History   Socioeconomic History   Marital status: Married    Spouse name: Not on file   Number of children: Not on file   Years of education: Teacher, English as a foreign language    Highest education level: High school  graduate  Occupational History   Not on file  Tobacco Use   Smoking status: Former    Packs/day: 1.00    Years: 20.00    Pack years: 20.00    Types: Cigarettes    Quit date: 07/1981    Years since quitting: 39.0   Smokeless tobacco: Former  Scientific laboratory technician Use: Never used  Substance and Sexual Activity   Alcohol use: Not Currently    Comment: past   Drug use: No   Sexual activity: Not on file  Other Topics Concern   Not on file  Social History Narrative   Not on file   Social Determinants of Health   Financial Resource Strain: Low Risk    Difficulty of Paying Living Expenses: Not hard at all  Food Insecurity: No Food Insecurity   Worried About Charity fundraiser in the Last Year: Never true   Arboriculturist in the Last Year: Never true  Transportation Needs: No Transportation Needs   Lack of Transportation (Medical): No   Lack of Transportation (Non-Medical): No  Physical Activity: Insufficiently Active   Days of Exercise per Week: 2 days   Minutes of Exercise per Session: 60 min  Stress: Stress Concern Present   Feeling of Stress :  To some extent  Social Connections: Not on file    Tobacco Counseling Counseling given: Not Answered   Clinical Intake:  Pre-visit preparation completed: Yes  Pain : No/denies pain     Nutritional Status: BMI > 30  Obese Nutritional Risks: None Diabetes: Yes  How often do you need to have someone help you when you read instructions, pamphlets, or other written materials from your doctor or pharmacy?: 1 - Never What is the last grade level you completed in school?: 10th grade  Diabetic? Yes Nutrition Risk Assessment:  Has the patient had any N/V/D within the last 2 months?  No  Does the patient have any non-healing wounds?  No  Has the patient had any unintentional weight loss or weight gain?  Yes   Diabetes:  Is the patient diabetic?  Yes  If diabetic, was a CBG obtained today?  No  Did the patient bring in their glucometer from home?  No  How often do you monitor your CBG's? Does not check.   Financial Strains and Diabetes Management:  Are you having any financial strains with the device, your supplies or your medication? No .  Does the patient want to be seen by Chronic Care Management for management of their diabetes?  No  Would the patient like to be referred to a Nutritionist or for Diabetic Management?  No   Diabetic Exams:  Diabetic Eye Exam: Overdue for diabetic eye exam. Pt has been advised about the importance in completing this exam. Patient advised to call and schedule an eye exam. Diabetic Foot Exam: Completed 08/06/2019   Interpreter Needed?: No  Information entered by :: NAllen LPN   Activities of Daily Living In your present state of health, do you have any difficulty performing the following activities: 07/18/2020 04/22/2020  Hearing? N N  Comment hearing aides -  Vision? N N  Difficulty concentrating or making decisions? N N  Walking or climbing stairs? Y Y  Comment due to knees -  Dressing or bathing? N N  Doing errands, shopping? N N   Preparing Food and eating ? N -  Using the Toilet? N -  In the past six months, have you  accidently leaked urine? Y -  Do you have problems with loss of bowel control? N -  Managing your Medications? N -  Managing your Finances? N -  Housekeeping or managing your Housekeeping? N -  Some recent data might be hidden    Patient Care Team: Olin Hauser, DO as PCP - General (Family Medicine) Rockey Situ Kathlene November, MD as PCP - Cardiology (Cardiology) Vanita Ingles, RN as Case Manager (General Practice)  Indicate any recent Medical Services you may have received from other than [facility] providers in the past year (date may be approximate).     Assessment:   This is a routine wellness examination for Marney.  Hearing/Vision screen No results found.  Dietary issues and exercise activities discussed: Current Exercise Habits: Home exercise routine, Type of exercise: strength training/weights;stretching (PT), Time (Minutes): 60, Frequency (Times/Week): 2, Weekly Exercise (Minutes/Week): 120   Goals Addressed             This Visit's Progress    Patient Stated       07/18/2020, exercise more to get more flexible and stable        Depression Screen PHQ 2/9 Scores 07/18/2020 02/09/2020 08/06/2019 05/24/2019 02/01/2019 02/01/2019 12/22/2018  PHQ - 2 Score 0 1 1 0 1 1 0  PHQ- 9 Score - 7 4 - 4 4 -    Fall Risk Fall Risk  07/18/2020 02/09/2020 02/01/2019 12/22/2018 11/12/2017  Falls in the past year? 1 1 0 1 Yes  Comment tripped over dog, lost balance - - - -  Number falls in past yr: 1 0 0 1 1  Injury with Fall? 0 0 0 1 Yes  Risk for fall due to : Medication side effect - - Impaired balance/gait -  Follow up Falls evaluation completed;Education provided;Falls prevention discussed - - - -    FALL RISK PREVENTION PERTAINING TO THE HOME:  Any stairs in or around the home? Yes  If so, are there any without handrails? No  Home free of loose throw rugs in walkways, pet beds, electrical  cords, etc? Yes  Adequate lighting in your home to reduce risk of falls? Yes   ASSISTIVE DEVICES UTILIZED TO PREVENT FALLS:  Life alert? Yes  Use of a cane, walker or w/c? No  Grab bars in the bathroom? Yes  Shower chair or bench in shower? Yes  Elevated toilet seat or a handicapped toilet? Yes   TIMED UP AND GO:  Was the test performed? No .      Cognitive Function:     6CIT Screen 07/18/2020  What Year? 0 points  What month? 0 points  What time? 0 points  Count back from 20 0 points  Months in reverse 0 points  Repeat phrase 0 points  Total Score 0    Immunizations Immunization History  Administered Date(s) Administered   Fluad Quad(high Dose 65+) 11/06/2018, 02/09/2020   Influenza, High Dose Seasonal PF 10/29/2017   Influenza-Unspecified 10/27/2017   PFIZER(Purple Top)SARS-COV-2 Vaccination 03/18/2019, 04/08/2019   Pneumococcal Conjugate-13 11/01/2015   Pneumococcal Polysaccharide-23 09/04/2017   Td 09/24/2017   Zoster Recombinat (Shingrix) 11/29/2016, 02/05/2017    TDAP status: Up to date  Flu Vaccine status: Up to date  Pneumococcal vaccine status: Up to date  Covid-19 vaccine status: Completed vaccines  Qualifies for Shingles Vaccine? Yes   Zostavax completed No   Shingrix Completed?: Yes  Screening Tests Health Maintenance  Topic Date Due   COVID-19 Vaccine (3 - Booster for [employer]  series) 09/08/2019   OPHTHALMOLOGY EXAM  10/13/2019   URINE MICROALBUMIN  08/05/2020   FOOT EXAM  08/05/2020   INFLUENZA VACCINE  08/21/2020   HEMOGLOBIN A1C  10/22/2020   TETANUS/TDAP  09/25/2027   DEXA SCAN  Completed   PNA vac Low Risk Adult  Completed   Zoster Vaccines- Shingrix  Completed   HPV VACCINES  Aged Out    Health Maintenance  Health Maintenance Due  Topic Date Due   COVID-19 Vaccine (3 - Booster for Pfizer series) 09/08/2019   OPHTHALMOLOGY EXAM  10/13/2019   URINE MICROALBUMIN  08/05/2020    Colorectal cancer screening: No longer  required.   Mammogram status: No longer required due to age.  Bone Density status: Completed 08/25/2017.   Lung Cancer Screening: (Low Dose CT Chest recommended if Age 61-80 years, 30 pack-year currently smoking OR have quit w/in 15years.) does not qualify.   Lung Cancer Screening Referral: no  Additional Screening:  Hepatitis C Screening: does not qualify;   Vision Screening: Recommended annual ophthalmology exams for early detection of glaucoma and other disorders of the eye. Is the patient up to date with their annual eye exam?  Yes  Who is the provider or what is the name of the office in which the patient attends annual eye exams? [facility] If pt is not established with a provider, would they like to be referred to a provider to establish care? No .   Dental Screening: Recommended annual dental exams for proper oral hygiene  Community Resource Referral / Chronic Care Management: CRR required this visit?  No   CCM required this visit?  No      Plan:     I have personally reviewed and noted the following in the patient's chart:   Medical and social history Use of alcohol, tobacco or illicit drugs  Current medications and supplements including opioid prescriptions.  Functional ability and status Nutritional status Physical activity Advanced directives List of other physicians Hospitalizations, surgeries, and ER visits in previous 12 months Vitals Screenings to include cognitive, depression, and falls Referrals and appointments  In addition, I have reviewed and discussed with patient certain preventive protocols, quality metrics, and best practice recommendations. A written personalized care plan for preventive services as well as general preventive health recommendations were provided to patient.     Kellie Simmering, LPN   [id number]   Nurse Notes:

## 2020-07-18 NOTE — Patient Instructions (Signed)
Jenna [name] , Thank you for taking time to come for your Medicare Wellness Visit. I appreciate your ongoing commitment to your health goals. Please review the following plan we discussed and let me know if I can assist you in the future.   Screening recommendations/referrals: Colonoscopy: not required Mammogram: not required Bone Density: completed 08/25/2017 Recommended yearly ophthalmology/optometry visit for glaucoma screening and checkup Recommended yearly dental visit for hygiene and checkup  Vaccinations: Influenza vaccine: completed 02/09/2020, due 08/21/2020 Pneumococcal vaccine: completed 09/04/2017 Tdap vaccine: completed 09/24/2017, due 09/25/2027 Shingles vaccine: completed   Covid-19: 04/08/2019, 03/18/2019  Advanced directives: Advance directive discussed with you today.   Conditions/risks identified: none  Next appointment: Follow up in one year for your annual wellness visit    Preventive Care 65 Years and Older, Female Preventive care refers to lifestyle choices and visits with your health care provider that can promote health and wellness. What does preventive care include? A yearly physical exam. This is also called an annual well check. Dental exams once or twice a year. Routine eye exams. Ask your health care provider how often you should have your eyes checked. Personal lifestyle choices, including: Daily care of your teeth and gums. Regular physical activity. Eating a healthy diet. Avoiding tobacco and drug use. Limiting alcohol use. Practicing safe sex. Taking low-dose aspirin every day. Taking vitamin and mineral supplements as recommended by your health care provider. What happens during an annual well check? The services and screenings done by your health care provider during your annual well check will depend on your age, overall health, lifestyle risk factors, and family history of disease. Counseling  Your health care provider may ask you questions about  your: Alcohol use. Tobacco use. Drug use. Emotional well-being. Home and relationship well-being. Sexual activity. Eating habits. History of falls. Memory and ability to understand (cognition). Work and work Statistician. Reproductive health. Screening  You may have the following tests or measurements: Height, weight, and BMI. Blood pressure. Lipid and cholesterol levels. These may be checked every 5 years, or more frequently if you are over 71 years old. Skin check. Lung cancer screening. You may have this screening every year starting at age 24 if you have a 30-pack-year history of smoking and currently smoke or have quit within the past 15 years. Fecal occult blood test (FOBT) of the stool. You may have this test every year starting at age 55. Flexible sigmoidoscopy or colonoscopy. You may have a sigmoidoscopy every 5 years or a colonoscopy every 10 years starting at age 52. Hepatitis C blood test. Hepatitis B blood test. Sexually transmitted disease (STD) testing. Diabetes screening. This is done by checking your blood sugar (glucose) after you have not eaten for a while (fasting). You may have this done every 1-3 years. Bone density scan. This is done to screen for osteoporosis. You may have this done starting at age 73. Mammogram. This may be done every 1-2 years. Talk to your health care provider about how often you should have regular mammograms. Talk with your health care provider about your test results, treatment options, and if necessary, the need for more tests. Vaccines  Your health care provider may recommend certain vaccines, such as: Influenza vaccine. This is recommended every year. Tetanus, diphtheria, and acellular pertussis (Tdap, Td) vaccine. You may need a Td booster every 10 years. Zoster vaccine. You may need this after age 52. Pneumococcal 13-valent conjugate (PCV13) vaccine. One dose is recommended after age 71. Pneumococcal polysaccharide (PPSV23) vaccine.  One dose is recommended after age 55. Talk to your health care provider about which screenings and vaccines you need and how often you need them. This information is not intended to replace advice given to you by your health care provider. Make sure you discuss any questions you have with your health care provider. Document Released: 02/03/2015 Document Revised: 09/27/2015 Document Reviewed: 11/08/2014 Elsevier Interactive Patient Education  2017 [employer] Prevention in the Home Falls can cause injuries. They can happen to people of all ages. There are many things you can do to make your home safe and to help prevent falls. What can I do on the outside of my home? Regularly fix the edges of walkways and driveways and fix any cracks. Remove anything that might make you trip as you walk through a door, such as a raised step or threshold. Trim any bushes or trees on the path to your home. Use bright outdoor lighting. Clear any walking paths of anything that might make someone trip, such as rocks or tools. Regularly check to see if handrails are loose or broken. Make sure that both sides of any steps have handrails. Any raised decks and porches should have guardrails on the edges. Have any leaves, snow, or ice cleared regularly. Use sand or salt on walking paths during winter. Clean up any spills in your garage right away. This includes oil or grease spills. What can I do in the bathroom? Use night lights. Install grab bars by the toilet and in the tub and shower. Do not use towel bars as grab bars. Use non-skid mats or decals in the tub or shower. If you need to sit down in the shower, use a plastic, non-slip stool. Keep the floor dry. Clean up any water that spills on the floor as soon as it happens. Remove soap buildup in the tub or shower regularly. Attach bath mats securely with double-sided non-slip rug tape. Do not have throw rugs and other things on the floor that can make  you trip. What can I do in the bedroom? Use night lights. Make sure that you have a light by your bed that is easy to reach. Do not use any sheets or blankets that are too big for your bed. They should not hang down onto the floor. Have a firm chair that has side arms. You can use this for support while you get dressed. Do not have throw rugs and other things on the floor that can make you trip. What can I do in the kitchen? Clean up any spills right away. Avoid walking on wet floors. Keep items that you use a lot in easy-to-reach places. If you need to reach something above you, use a strong step stool that has a grab bar. Keep electrical cords out of the way. Do not use floor polish or wax that makes floors slippery. If you must use wax, use non-skid floor wax. Do not have throw rugs and other things on the floor that can make you trip. What can I do with my stairs? Do not leave any items on the stairs. Make sure that there are handrails on both sides of the stairs and use them. Fix handrails that are broken or loose. Make sure that handrails are as long as the stairways. Check any carpeting to make sure that it is firmly attached to the stairs. Fix any carpet that is loose or worn. Avoid having throw rugs at the top or bottom of the  stairs. If you do have throw rugs, attach them to the floor with carpet tape. Make sure that you have a light switch at the top of the stairs and the bottom of the stairs. If you do not have them, ask someone to add them for you. What else can I do to help prevent falls? Wear shoes that: Do not have high heels. Have rubber bottoms. Are comfortable and fit you well. Are closed at the toe. Do not wear sandals. If you use a stepladder: Make sure that it is fully opened. Do not climb a closed stepladder. Make sure that both sides of the stepladder are locked into place. Ask someone to hold it for you, if possible. Clearly mark and make sure that you can  see: Any grab bars or handrails. First and last steps. Where the edge of each step is. Use tools that help you move around (mobility aids) if they are needed. These include: Canes. Walkers. Scooters. Crutches. Turn on the lights when you go into a dark area. Replace any light bulbs as soon as they burn out. Set up your furniture so you have a clear path. Avoid moving your furniture around. If any of your floors are uneven, fix them. If there are any pets around you, be aware of where they are. Review your medicines with your doctor. Some medicines can make you feel dizzy. This can increase your chance of falling. Ask your doctor what other things that you can do to help prevent falls. This information is not intended to replace advice given to you by your health care provider. Make sure you discuss any questions you have with your health care provider. Document Released: 11/03/2008 Document Revised: 06/15/2015 Document Reviewed: 02/11/2014 Elsevier Interactive Patient Education  2017 [employer].

## 2020-07-19 DIAGNOSIS — R296 Repeated falls: Secondary | ICD-10-CM | POA: Diagnosis not present

## 2020-07-19 DIAGNOSIS — R262 Difficulty in walking, not elsewhere classified: Secondary | ICD-10-CM | POA: Diagnosis not present

## 2020-07-19 DIAGNOSIS — R0602 Shortness of breath: Secondary | ICD-10-CM | POA: Diagnosis not present

## 2020-07-19 DIAGNOSIS — R531 Weakness: Secondary | ICD-10-CM | POA: Diagnosis not present

## 2020-07-27 DIAGNOSIS — R296 Repeated falls: Secondary | ICD-10-CM | POA: Diagnosis not present

## 2020-07-27 DIAGNOSIS — R531 Weakness: Secondary | ICD-10-CM | POA: Diagnosis not present

## 2020-07-27 DIAGNOSIS — R262 Difficulty in walking, not elsewhere classified: Secondary | ICD-10-CM | POA: Diagnosis not present

## 2020-07-27 DIAGNOSIS — R0602 Shortness of breath: Secondary | ICD-10-CM | POA: Diagnosis not present

## 2020-07-31 ENCOUNTER — Telehealth: Payer: Medicare Other | Admitting: General Practice

## 2020-07-31 ENCOUNTER — Ambulatory Visit (INDEPENDENT_AMBULATORY_CARE_PROVIDER_SITE_OTHER): Payer: Medicare Other | Admitting: General Practice

## 2020-07-31 DIAGNOSIS — J432 Centrilobular emphysema: Secondary | ICD-10-CM | POA: Diagnosis not present

## 2020-07-31 DIAGNOSIS — E785 Hyperlipidemia, unspecified: Secondary | ICD-10-CM

## 2020-07-31 DIAGNOSIS — E1169 Type 2 diabetes mellitus with other specified complication: Secondary | ICD-10-CM

## 2020-07-31 DIAGNOSIS — R262 Difficulty in walking, not elsewhere classified: Secondary | ICD-10-CM | POA: Diagnosis not present

## 2020-07-31 DIAGNOSIS — J441 Chronic obstructive pulmonary disease with (acute) exacerbation: Secondary | ICD-10-CM | POA: Diagnosis not present

## 2020-07-31 DIAGNOSIS — R296 Repeated falls: Secondary | ICD-10-CM

## 2020-07-31 DIAGNOSIS — R531 Weakness: Secondary | ICD-10-CM | POA: Diagnosis not present

## 2020-07-31 DIAGNOSIS — R0602 Shortness of breath: Secondary | ICD-10-CM | POA: Diagnosis not present

## 2020-07-31 DIAGNOSIS — N3941 Urge incontinence: Secondary | ICD-10-CM

## 2020-07-31 NOTE — Patient Instructions (Signed)
Visit Information  PATIENT GOALS:  Goals Addressed             This Visit's Progress    RNCM: Track and Manage My Symptoms-COPD       Timeframe:  Long-Range Goal Priority:  Medium Start Date:     02-03-2020                        Expected End Date:     02-19-2021               Follow Up Date 10-02-2020   - develop a rescue plan - eliminate symptom triggers at home - follow rescue plan if symptoms flare-up - keep follow-up appointments - use an extra pillow to sleep    Why is this important?   Tracking your symptoms and other information about your health helps your doctor plan your care.  Write down the symptoms, the time of day, what you were doing and what medicine you are taking.  You will soon learn how to manage your symptoms.     Notes: The patient has a good understanding of triggers to her COPD. 05-22-2020: The patient had a hospitalization for COPD exacerbation on 04-22-2020 to 04-28-2020. She is doing much better now. Medication changes have been helpful. She sees the pulmonologist on 05-31-2020. 07-31-2020: Has not had any new flare ups or COPD exacerbations. States she has been having to use her inhaler more. The patient states that she got a good report from her pulmonary provider. Denies any acute distress.       RNCM: Track Symptoms-Urinary Incontinence       Timeframe:  Long-Range Goal Priority:  High Start Date:      07-31-2020                       Expected End Date:      07-31-2021                 Follow Up Date 10/02/2020    - track number of times I do not get to the bathroom in time - track when, how much I go    Why is this important?   There are many reasons you may leak urine (pee). Keeping a diary of symptoms can help you figure out why. This will help you and your doctor come up with a treatment plan.    Notes: The patient states that she sometimes will stand up and the urine will just run down her leg. Has noticed it is progressively getting worse over the  last 3 months. Advised the patient to write down questions to ask the pcp at upcoming appointment. Will continue to follow.          Patient verbalizes understanding of instructions provided today and agrees to view in [place].   Telephone follow up appointment with care management team member scheduled for:10-02-2020 at 49 am  [place], MSN, [place] Mobile: [phone]

## 2020-07-31 NOTE — Chronic Care Management (AMB) (Signed)
Chronic Care Management   CCM RN Visit Note  3/84/[id number] Name: Jenna [name] MRN: [id number] DOB: [birth year]  Subjective: Jenna [name] is a 85 y.o. year old female who is a primary care patient of Olin Hauser, DO. The care management team was consulted for assistance with disease management and care coordination needs.    Engaged with patient by telephone for follow up visit in response to provider referral for case management and/or care coordination services.   Consent to Services:  The patient was given information about Chronic Care Management services, agreed to services, and gave verbal consent prior to initiation of services.  Please see initial visit note for detailed documentation.   Patient agreed to services and verbal consent obtained.   Assessment: Review of patient past medical history, allergies, medications, health status, including review of consultants reports, laboratory and other test data, was performed as part of comprehensive evaluation and provision of chronic care management services.   SDOH (Social Determinants of Health) assessments and interventions performed:    CCM Care Plan  Allergies  Allergen Reactions   Azithromycin Other (See Comments)    Causes stomach burning pains   Hydromorphone Other (See Comments)    confusion, personality change   Morphine Other (See Comments)    Goes crazy   Nsaids Other (See Comments)    Avoids because of gastric bypass surgery   Oxycodone-Acetaminophen Nausea And Vomiting   Codeine Other (See Comments) and Nausea And Vomiting    "will not stay down"   Nabumetone Rash   Oxycodone Nausea And Vomiting   Oxycodone-Acetaminophen Nausea And Vomiting   Promethazine Hcl Rash and Other (See Comments)    Outpatient Encounter Medications as of 07/31/2020  Medication Sig   albuterol (PROVENTIL) (2.5 MG/3ML) 0.083% nebulizer solution Take 3 mLs (2.5 mg total) by nebulization every 4 (four) hours as needed for  wheezing or shortness of breath.   albuterol (VENTOLIN HFA) 108 (90 Base) MCG/ACT inhaler Inhale 2 puffs into the lungs every 6 (six) hours as needed for wheezing or shortness of breath.   alendronate (FOSAMAX) 70 MG tablet Take 1 tablet (70 mg total) by mouth once a week.   Ascorbic Acid (VITAMIN C) 1000 MG tablet Take 500 mg by mouth daily.    Cholecalciferol 25 MCG (1000 UT) tablet Take 1,000 Units by mouth daily.    citalopram (CELEXA) 40 MG tablet Take 1 tablet (40 mg total) by mouth daily.   clotrimazole-betamethasone (LOTRISONE) cream Apply 1-2 times a day for worsening flare dry skin dermatitis of toes/feet, may re-use daily up to 1 week as needed.   Cyanocobalamin 1000 MCG TBCR Take 1,000 mcg by mouth daily.    diclofenac sodium (VOLTAREN) 1 % GEL Apply 2 g topically 3 (three) times daily as needed. For hands, arthritis   famotidine (PEPCID) 20 MG tablet Take 20 mg by mouth 2 (two) times daily.   ferrous sulfate 325 (65 FE) MG EC tablet Take 325 mg by mouth at bedtime.    furosemide (LASIX) 20 MG tablet Take 1 tablet (20 mg total) by mouth daily as needed for fluid or edema. Take lasix daily, with extra dose as needed for swelling   ipratropium (ATROVENT) 0.02 % nebulizer solution Take 2.5 mLs (0.5 mg total) by nebulization every 6 (six) hours as needed for wheezing or shortness of breath.   methocarbamol (ROBAXIN) 500 MG tablet Take 1 tablet (500 mg total) by mouth every 8 (eight) hours as needed for muscle spasms.  metoprolol succinate (TOPROL-XL) 25 MG 24 hr tablet Take 1 tablet (25 mg total) by mouth daily. Take an additional 12.5 mg - 25 mg as needed for palpitations   Multiple Vitamins-Minerals (ONE-A-DAY WOMENS PETITES) TABS Take 2 tablets by mouth every morning.   Omega-3 Fatty Acids (FISH OIL) 1000 MG CAPS Take 1,000 mg by mouth daily.   simvastatin (ZOCOR) 40 MG tablet Take 1 tablet (40 mg total) by mouth daily.   traZODone (DESYREL) 50 MG tablet Take 1 tablet (50 mg total) by  mouth at bedtime.   No facility-administered encounter medications on file as of 07/31/2020.    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 ([place]) 10/13/2018   Mild nonproliferative diabetic retinopathy of right eye without macular edema ([place]) 10/13/2018   Controlled type 2 diabetes mellitus with diabetic nephropathy, without long-term current use of insulin ([place]) 11/13/2017   Hyperlipidemia associated with type 2 diabetes mellitus ([place]) 11/13/2017   Depression, major, recurrent ([place]) 11/12/2017   Psychophysiological insomnia 11/12/2017   Primary osteoarthritis of both hands 11/12/2017   Primary osteoarthritis involving multiple joints 11/12/2017   Centrilobular emphysema ([place]) 11/12/2017   Obesity (BMI 35.0-39.9 without comorbidity) 11/13/2013   Esophageal reflux 11/13/2013   Bilateral leg edema 11/13/2013   Chronic cough 11/13/2013   Hx of adenomatous colonic polyps 10/15/2013    Conditions to be addressed/monitored:HLD, COPD, and Urinary incontinence, and falls  Care Plan : RNCM: COPD (Adult)  Updates made by Vanita Ingles since 07/31/2020 12:00 AM     Problem: RNCM: Psychological Adjustment to Diagnosis (COPD)   Priority: Medium     Long-Range Goal: RNCM: COPD Adjustment to Disease Achieved   Start Date: 02/03/2020  Expected End Date: 05/27/2021  This Visit's Progress: On track  Priority: Medium  Note:   Current Barriers:  Knowledge deficits related to basic understanding of COPD disease process Knowledge deficits related to basic COPD self care/management Knowledge deficit related to basic understanding of how to use inhalers and how inhaled medications  work Knowledge deficit related to importance of energy conservation Unable to independently manage COPD Does not contact provider office for questions/concerns  Case Manager Clinical Goal(s): patient will report using inhalers as prescribed including rinsing mouth after use patient will report utilizing pursed lip breathing for shortness of breath patient will verbalize understanding of COPD action plan and when to seek appropriate levels of medical care patient will engage in lite exercise as tolerated to build/regain stamina and strength and reduce shortness of breath through activity tolerance patient will verbalize basic understanding of COPD disease process and self care activities patient will not be hospitalized for COPD exacerbation as evidenced  Interventions:  Collaboration with [place] Ranger, Devonne Doughty, DO regarding development and update of comprehensive plan of care as evidenced by provider attestation and co-signature Inter-disciplinary care team collaboration (see longitudinal plan of care) Provided patient with basic written and verbal COPD education on self care/management/and exacerbation prevention  Provided patient with COPD action plan and reinforced importance of daily self assessment. The patient states that she saw the pulmonary provider in May and there were no changes to her medications regimen. She states she has had to use her inhalers more due to the hot weather. Has not had any  new COPD exacerbations.  Provided written and verbal instructions on pursed lip breathing and utilized returned demonstration as teach back Provided instruction about proper use of medications used for management of COPD including inhalers Advised patient to self assesses COPD action plan zone and make appointment with provider if in the yellow zone for 48 hours without improvement. Provided patient with education about the role of exercise in the management of COPD Advised patient to engage  in light exercise as tolerated 3-5 days a week Provided education about and advised patient to utilize infection prevention strategies to reduce risk of respiratory infection. 07-31-2020: The patient's sister is hospitalized with COVID but she has not been around her sister. She states she is doing well and denies any issues or sx and sx of infection. Will continue to monitor. Sees pcp on [phone]. Self-Care Activities:  Patient verbalizes understanding of plan to effectively manage COPD Self administers medications as prescribed Attends all scheduled provider appointments Calls pharmacy for medication refills Attends church or other social activities Performs ADL's independently Performs IADL's independently Calls provider office for new concerns or questions Patient Goals: - do breathing exercises at least 2 times each day - do exercises in a comfortable position that makes breathing as easy as possible - develop a new routine to improve sleep - don't eat or exercise right before bedtime - eat healthy - get at least 7 to 8 hours of sleep at night - get outdoors every day (weather permitting) - keep room cool and dark - limit daytime naps - practice relaxation or meditation daily - use a fan or white noise in bedroom - use devices that will help like a cane, sock-puller or reacher - develop a rescue plan - eliminate symptom triggers at home - follow rescue plan if symptoms flare-up - keep follow-up appointments - avoid second hand smoke - eliminate smoking in my home - identify and avoid work-related triggers - identify and remove indoor air pollutants - limit outdoor activity during cold weather - listen for public air quality announcements every day Follow Up Plan: Telephone follow up appointment with care management team member scheduled for: 10-02-2020 at 1030 am    Task: RNCM: Support Psychosocial Response to Chronic Obstructive Pulmonary Disease Completed 07/31/2020   Outcome: Positive  Note:   Care Management Activities:    - counseling provided - decision-making supported - depression screen reviewed - emotional support provided - problem-solving facilitated - relaxation techniques promoted - verbalization of feelings encouraged        Care Plan : RNCM: HLD Management  Updates made by Vanita Ingles since 07/31/2020 12:00 AM     Problem: RNCM: Health Promotion or Disease Self-Management (General Plan of Care)   Priority: Medium     Long-Range Goal: RNCM: [id number] Plan Developed   Start Date: 02/03/2020  Expected End Date: 07/26/2021  This Visit's Progress: On track  Priority: Medium  Note:   Current Barriers:  Poorly controlled hyperlipidemia, complicated by COPD Current antihyperlipidemic regimen: Zocor 40 mg daily  Most recent lipid panel:     Component Value Date/Time   CHOL 170 01/31/2020 0756   TRIG 146 01/31/2020 0756   HDL 80 01/31/2020 0756   CHOLHDL 2.1 01/31/2020 0756   LDLCALC 67 01/31/2020 0756   ASCVD risk enhancing conditions: age >81, DM, former smoker, COPD Unable to independently manage HLD Does not contact provider office for questions/concerns RN Care Manager Clinical Goal(s):  patient will work with Consulting civil engineer, providers, and care  team towards execution of optimized self-health management plan patient will verbalize understanding of plan for effective management of HLD patient will work with [facility], pcp and CCM team  to address needs related to effective management of HLD patient will take all medications exactly as prescribed and will call provider for medication related questions patient will attend all scheduled medical appointments: 08-09-2020 at 1:40 pm- reviewed with the patient today patient will demonstrate improved health management independence patient will demonstrate understanding of rationale for each prescribed medication the patient will demonstrate ongoing self health care  management ability Interventions: Collaboration with Olin Hauser, DO regarding development and update of comprehensive plan of care as evidenced by provider attestation and co-signature Inter-disciplinary care team collaboration (see longitudinal plan of care) Medication review performed; medication list updated in electronic medical record.  Inter-disciplinary care team collaboration (see longitudinal plan of care) Referred to pharmacy team for assistance with HLD medication management Evaluation of current treatment plan related to HLD and patient's adherence to plan as established by provider. Advised patient to call the office for changes, questions, or concerns, also to write down questions to ask the pcp at the next appointment on 08-09-2020 Provided education to patient re: taking medications, compliance with heart healthy/ADA diet, adequate rest and safety  Reviewed medications with patient and discussed compliance  Reviewed scheduled/upcoming provider appointments including: 08-09-2020 at 1:40 pm- reminded the patient today of her lab appointment on 08-02-2020 and her Md appointment on 08-09-2020 Discussed plans with patient for ongoing care management follow up and provided patient with direct contact information for care management team Patient Goals/Self-Care Activities: - call for medicine refill 2 or 3 days before it runs out - call if I am sick and can't take my medicine - keep a list of all the medicines I take; vitamins and herbals too - learn to read medicine labels - use a pillbox to sort medicine - use an alarm clock or phone to remind me to take my medicine - change to whole grain breads, cereal, pasta - drink 6 to 8 glasses of water each day - eat 3 to 5 servings of fruits and vegetables each day - eat 5 or 6 small meals each day - eat fish at least once per week - fill half the plate with nonstarchy vegetables - limit fast food meals to no more than 1 per  week - manage portion size - prepare main meal at home 3 to 5 days each week - read food labels for fat, fiber, carbohydrates and portion size - reduce red meat to 2 to 3 times a week - be open to making changes - I can manage, know and watch for signs of a heart attack - if I have chest pain, call for help - learn about small changes that will make a big difference - learn my personal risk factors  Follow Up Plan: Telephone follow up appointment with care management team member scheduled for: 10-02-2020 at 1030 am      Task: Mutually Develop and Royce Macadamia Achievement of Patient Goals Completed 07/31/2020  Outcome: Positive  Note:   Care Management Activities:    - advance care planning facilitated - barriers to meeting goals identified - change-talk evoked - choices provided - collaboration with team encouraged - decision-making supported - difficulty of making life-long changes acknowledged - health risks reviewed - problem-solving facilitated - questions answered - readiness for change evaluated - reassurance provided - self-reflection promoted - self-reliance encouraged  Notes:     Care Plan : RNCM: Falls prevention and safety  Updates made by Vanita Ingles since 07/31/2020 12:00 AM     Problem: RNCM: Falls Prevention, Home and Family Safety (Wellness)   Priority: High  Note:   Last fall on 01-29-2020    Long-Range Goal: RNCM: Falls, Home and Family Safety Maintained   Start Date: 02/03/2020  Expected End Date: 01/23/2021  This Visit's Progress: On track  Priority: High  Note:   Current Barriers:  Knowledge Deficits related to fall precautions in patient with multiple chronic conditions and decreased mobility Decreased adherence to prescribed treatment for fall prevention Unable to independently manage falls in the home setting Does not adhere to provider recommendations re: use of DME in the home setting  Does not contact provider office for  questions/concerns Knowledge Deficits related to fall preventions and safety in the home Does not use DME in the home, high fall risk patient Clinical Goal(s):  Over the next 120 days, patient will demonstrate improved adherence to prescribed treatment plan for decreasing falls as evidenced by patient reporting and review of EMR Over the next 120 days, patient will verbalize using fall risk reduction strategies discussed Over the next 120 days, patient will not experience additional falls Over the next 120 days, patient will verbalize understanding of plan for having a safety plan in place at home to prevent new falls  Over the next 120 days, patient will work with [facility], CCM team and pcp to address needs related to factors that may be contributing to falls in the home Over the next 120 days, patient will demonstrate a decrease in fall exacerbations as evidenced by no new falls or safety concerns  Over the next 120 days, patient will attend all scheduled medical appointments: 08-09-2020 at 1:40 pm Over the next 120 days, patient will demonstrate understanding of rationale for each prescribed medication as evidenced by compliance and working with pharmacist if needed to understand medications and review of high safety medication concerns Interventions:  Collaboration with [place] Ranger, Devonne Doughty, DO regarding development and update of comprehensive plan of care as evidenced by provider attestation and co-signature Inter-disciplinary care team collaboration (see longitudinal plan of care) Provided written and verbal education re: Potential causes of falls and Fall prevention strategies. 07-31-2020: Reviewed safety in the home and fall prevention. The patient denies falls with injuries but has had some "near misses". Empathetic listening and support given.  Reviewed medications and discussed potential side effects of medications such as dizziness and frequent urination Assessed for s/s of orthostatic  hypotension Assessed for falls since last encounter. 05-22-2020: The patient denies any new falls. The patient is currently working with Outpatient Physical therapy 2 times a week for strengthening post hospitalization. 07-31-2020: Denies falls with injuries but states she has a lot of "near misses". Cannot remember the last times she almost fell. Review of safety concerns and the patient being at high risk for falls. The patient talked about her sister falling and being in the hospital. Empathetic listening and support.  Assessed patients knowledge of fall risk prevention secondary to previously provided education. 07-31-2020: The patient states she is being mindful of her surroundings. Having increased issues with urinary incontinence that she will talk to pcp about on 08-09-2020 Assessed working status of life alert bracelet and patient adherence Provided patient information for fall alert systems Evaluation of current treatment plan related to falls and safety and patient's adherence to plan as established by provider. 05-22-2020: The  patient is being safe and denies any new concerns related to safety and falls prevention. Will continue to monitor. 07-31-2020: Denies falls with injury. Review of being safe in her environment. Will continue to monitor.  Advised patient to call the office for new falls and discuss falls with the pcp at upcoming visit  Provided education to patient re: safety in the home and the use of DME to help with safety in the home  Reviewed medications with patient and discussed compliance and review of high risk medications  Discussed plans with patient for ongoing care management follow up and provided patient with direct contact information for care management team Reviewed scheduled/upcoming provider appointments including: 08-09-2020 at 1:40 pm  Patient Goals/Self-Care Activities Over the next 120 days, patient will:   - Utilize walker, cane, or crutches (assistive device)  appropriately with all ambulation - De-clutter walkways - Change positions slowly - Wear secure fitting shoes at all times with ambulation - Utilize home lighting for dim lit areas - Demonstrate self and pet awareness at all times - plan for unforeseen emergencies (disaster) reviewed - resources needed to improve safety identified - risks to home and environmental safety identified - strategies to improve or maintain safety promoted Follow Up Plan: Telephone follow up appointment with care management team member scheduled for: 10-02-2020 at 1030 am    Task: RNCM: Identify and Reduce Risk to Safety Completed 07/31/2020  Outcome: Positive  Note:   Care Management Activities:    - plan for unforeseen emergencies (disaster) reviewed - resources needed to improve safety identified - risks to home and environmental safety identified - strategies to improve or maintain safety promoted        Care Plan : RNCM: Urinary Incontinence (Adult)  Updates made by Vanita Ingles since 07/31/2020 12:00 AM     Problem: RNCM: Symptom Management (Urinary Incontinence)   Priority: High     Long-Range Goal: RNCM: Urinary Incontinence Symptoms Manged   Start Date: 07/31/2020  Expected End Date: 07/26/2021  This Visit's Progress: Not on track  Priority: High  Note:   Current Barriers:  Ineffective Self Health Maintenance in a patient with  urinary incontinence and frequency  Unable to independently manage new onset of urinary incontinence progressively getting worse over the last 3 months Unable to perform IADLs independently Does not contact provider office for questions/concerns Clinical Goal(s):  Collaboration with Olin Hauser, DO regarding development and update of comprehensive plan of care as evidenced by provider attestation and co-signature Inter-disciplinary care team collaboration (see longitudinal plan of care) patient will work with care management team to address care  coordination and chronic disease management needs related to Disease Management Educational Needs Medication Management and Education Level of Care Concerns Other new concerns with urinary incontinence and frequency    Interventions:  Evaluation of current treatment plan related to  urinary incontinence  ,  self-management and patient's adherence to plan as established by provider. Collaboration with Olin Hauser, DO regarding development and update of comprehensive plan of care as evidenced by provider attestation       and co-signature Inter-disciplinary care team collaboration (see longitudinal plan of care) Discussed plans with patient for ongoing care management follow up and provided patient with direct contact information for care management team Assessed for new onset of urinary incontience. The patient states that sometimes when she stands up the urine just runs down her leg. She was inquiring about medications that may help. She states this started about  3 months ago and is progressively getting worse. Advised the patients to write down questions to ask the provider at upcoming visit oin 08-09-2020 at 1:40 pm. Education provided on safety, hygiene, and possibly needing a urology consult for further evaluation. Will continue to monitor.  Self Care Activities:  Patient verbalizes understanding of plan to effectively manage urinary incontience and frequency  Self administers medications as prescribed Attends all scheduled provider appointments Calls pharmacy for medication refills Attends church or other social activities Performs ADL's independently Performs IADL's independently Calls provider office for new concerns or questions Patient Goals: Write down questions for the provider Prevent Uti by using proper hygiene  Practice safety and prevent falls Work with CCM team to optimize health and well being - healthy lifestyle promoted - incontinence product use  encouraged - medication side effects managed - medication-adherence assessment completed - participation in physical therapy encouraged - practice of bladder-training techniques encouraged - practice of pelvic floor exercises encouraged - response to pharmacologic therapy monitored - strategies to manage symptom triggers promoted - symptom triggers identified  Follow Up Plan: Telephone follow up appointment with care management team member scheduled for: 10-02-2020 at 1030 am    Task: RNCM: Alleviate Barriers to Incontinence Treatment Completed 07/31/2020  Outcome: Positive  Note:   Care Management Activities:    - healthy lifestyle promoted - incontinence product use encouraged - medication side effects managed - medication-adherence assessment completed - participation in physical therapy encouraged - practice of bladder-training techniques encouraged - practice of pelvic floor exercises encouraged - response to pharmacologic therapy monitored - strategies to manage symptom triggers promoted - symptom triggers identified    Notes:      Plan:Telephone follow up appointment with care management team member scheduled for:  10-02-2020 at 39 am  Noreene Larsson RN, MSN, [facility] [employer] Mobile: [phone]

## 2020-08-01 ENCOUNTER — Other Ambulatory Visit: Payer: Self-pay

## 2020-08-01 DIAGNOSIS — E1121 Type 2 diabetes mellitus with diabetic nephropathy: Secondary | ICD-10-CM

## 2020-08-01 DIAGNOSIS — R7989 Other specified abnormal findings of blood chemistry: Secondary | ICD-10-CM

## 2020-08-01 DIAGNOSIS — R600 Localized edema: Secondary | ICD-10-CM

## 2020-08-02 ENCOUNTER — Other Ambulatory Visit: Payer: Medicare Other

## 2020-08-02 ENCOUNTER — Other Ambulatory Visit: Payer: Self-pay

## 2020-08-02 DIAGNOSIS — R600 Localized edema: Secondary | ICD-10-CM | POA: Diagnosis not present

## 2020-08-02 DIAGNOSIS — R7989 Other specified abnormal findings of blood chemistry: Secondary | ICD-10-CM | POA: Diagnosis not present

## 2020-08-02 DIAGNOSIS — E1121 Type 2 diabetes mellitus with diabetic nephropathy: Secondary | ICD-10-CM | POA: Diagnosis not present

## 2020-08-03 DIAGNOSIS — R531 Weakness: Secondary | ICD-10-CM | POA: Diagnosis not present

## 2020-08-03 DIAGNOSIS — R262 Difficulty in walking, not elsewhere classified: Secondary | ICD-10-CM | POA: Diagnosis not present

## 2020-08-03 DIAGNOSIS — R0602 Shortness of breath: Secondary | ICD-10-CM | POA: Diagnosis not present

## 2020-08-03 DIAGNOSIS — R296 Repeated falls: Secondary | ICD-10-CM | POA: Diagnosis not present

## 2020-08-03 LAB — THYROID PANEL WITH TSH
Free Thyroxine Index: 2.2 (ref 1.4–3.8)
T3 Uptake: 30 % (ref 22–35)
T4, Total: 7.4 ug/dL (ref 5.1–11.9)
TSH: 4.86 mIU/L — ABNORMAL HIGH (ref 0.40–4.50)

## 2020-08-03 LAB — BASIC METABOLIC PANEL WITH GFR
BUN/Creatinine Ratio: 16 (calc) (ref 6–22)
BUN: 15 mg/dL (ref 7–25)
CO2: 25 mmol/L (ref 20–32)
Calcium: 8.8 mg/dL (ref 8.6–10.4)
Chloride: 108 mmol/L (ref 98–110)
Creat: 0.96 mg/dL — ABNORMAL HIGH (ref 0.60–0.95)
Glucose, Bld: 126 mg/dL — ABNORMAL HIGH (ref 65–99)
Potassium: 3.8 mmol/L (ref 3.5–5.3)
Sodium: 142 mmol/L (ref 135–146)
eGFR: 59 mL/min/{1.73_m2} — ABNORMAL LOW (ref >=60)

## 2020-08-03 LAB — HEMOGLOBIN A1C
Hgb A1c MFr Bld: 6.6 % of total Hgb — ABNORMAL HIGH (ref <5.7)
Mean Plasma Glucose: 143 mg/dL
eAG (mmol/L): 7.9 mmol/L

## 2020-08-09 ENCOUNTER — Encounter: Payer: Self-pay | Admitting: Family Medicine

## 2020-08-09 ENCOUNTER — Other Ambulatory Visit: Payer: Self-pay | Admitting: Family Medicine

## 2020-08-09 ENCOUNTER — Other Ambulatory Visit: Payer: Self-pay

## 2020-08-09 ENCOUNTER — Ambulatory Visit (INDEPENDENT_AMBULATORY_CARE_PROVIDER_SITE_OTHER): Payer: Medicare Other | Admitting: Family Medicine

## 2020-08-09 VITALS — BP 149/45 | HR 65 | Ht 63.0 in | Wt 227.2 lb

## 2020-08-09 DIAGNOSIS — J432 Centrilobular emphysema: Secondary | ICD-10-CM | POA: Diagnosis not present

## 2020-08-09 DIAGNOSIS — E669 Obesity, unspecified: Secondary | ICD-10-CM

## 2020-08-09 DIAGNOSIS — B351 Tinea unguium: Secondary | ICD-10-CM

## 2020-08-09 DIAGNOSIS — Z0000 Encounter for general adult medical examination without abnormal findings: Secondary | ICD-10-CM

## 2020-08-09 DIAGNOSIS — E1169 Type 2 diabetes mellitus with other specified complication: Secondary | ICD-10-CM

## 2020-08-09 DIAGNOSIS — E1121 Type 2 diabetes mellitus with diabetic nephropathy: Secondary | ICD-10-CM

## 2020-08-09 DIAGNOSIS — N3946 Mixed incontinence: Secondary | ICD-10-CM | POA: Diagnosis not present

## 2020-08-09 DIAGNOSIS — R7989 Other specified abnormal findings of blood chemistry: Secondary | ICD-10-CM

## 2020-08-09 DIAGNOSIS — F331 Major depressive disorder, recurrent, moderate: Secondary | ICD-10-CM

## 2020-08-09 DIAGNOSIS — J441 Chronic obstructive pulmonary disease with (acute) exacerbation: Secondary | ICD-10-CM

## 2020-08-09 LAB — POCT UA - MICROALBUMIN: Microalbumin Ur, POC: 20 mg/L

## 2020-08-09 MED ORDER — ALBUTEROL SULFATE (2.5 MG/3ML) 0.083% IN NEBU: 2.5000 mg | INHALATION_SOLUTION | RESPIRATORY_TRACT | 5 refills | Status: DC | PRN

## 2020-08-09 MED ORDER — LEVOFLOXACIN 500 MG PO TABS: 500.0000 mg | ORAL_TABLET | Freq: Every day | ORAL | 0 refills | Status: DC

## 2020-08-09 MED ORDER — MIRABEGRON ER 25 MG PO TB24: 25.0000 mg | ORAL_TABLET | Freq: Every day | ORAL | 2 refills | Status: DC

## 2020-08-09 MED ORDER — PREDNISONE 20 MG PO TABS
ORAL_TABLET | ORAL | 0 refills | Status: DC
Start: 2020-08-09 — End: 2021-02-12

## 2020-08-09 MED ORDER — TERBINAFINE HCL 250 MG PO TABS: 250.0000 mg | ORAL_TABLET | Freq: Every day | ORAL | 2 refills | Status: DC

## 2020-08-09 NOTE — Progress Notes (Signed)
Subjective:    Patient ID: Jenna [name], female    DOB: Nov 02, 1935, 85 y.o.   MRN: [id number]  Jenna [name] is a 85 y.o. female presenting on 08/09/2020 for Diabetes and COPD   HPI  Emphysema./ COPD Flare, early 1 week ago with worsening dyspnea, increased thick white phlegm production, wheezing and dyspnea. Last visit 04/2020 for HFU for COPD exacerbation Followed by Dr [facility], last visit 05/2020, next visit in August. She is now on Trelegy daily, she will inquire about refills to [facility] Using Albuterol PRN recently increase last week. She can take Mucinex PRN and Flutter valve as well.  Urinary incontinence, mixed Reports mixture of incontinence symptoms, can happen with laugh,cough or strain or can happen more spontaneous if has urge to void and has some leakage. Not on medication. History of bladder tack surgery in past.  CHRONIC DM, Type 2, w/ nephropathy / CKD-III Previous results A1c 6.1-6.4 Recent lab A1c 6.6 No new concerns Meds: None Currently not on ACEi / ARB Lifestyle - Diet: Improved diet - Exercise; Limited regular exercise but working on improving Followed by [facility] Denies hypoglycemia, polyuria, visual changes, numbness or tingling.   Depression screen [facility] 2/9 07/18/2020 02/09/2020 08/06/2019  Decreased Interest 0 1 1  Down, Depressed, Hopeless 0 0 0  PHQ - 2 Score 0 1 1  Altered sleeping - 1 1  Tired, decreased energy - 3 2  Change in appetite - 2 0  Feeling bad or failure about yourself  - 0 0  Trouble concentrating - 0 0  Moving slowly or fidgety/restless - 0 0  Suicidal thoughts - 0 0  PHQ-9 Score - 7 4  Difficult doing work/chores - Not difficult at all Not difficult at all    Social History   Tobacco Use   Smoking status: Former    Packs/day: 1.00    Years: 20.00    Pack years: 20.00    Types: Cigarettes    Quit date: 07/1981    Years since quitting: 39.0   Smokeless tobacco: Former  IT trainer Use: Never used  Substance Use Topics   Alcohol use: Not Currently    Comment: past   Drug use: No    Review of Systems Per HPI unless specifically indicated above     Objective:    BP (!) 149/45   Pulse 65   Ht 5\' 3"  (1.6 m)   Wt 227 lb 3.2 oz (103.1 kg)   SpO2 98%   BMI 40.25 kg/m   Wt Readings from Last 3 Encounters:  08/09/20 227 lb 3.2 oz (103.1 kg)  07/18/20 210 lb (95.3 kg)  05/30/20 215 lb 8 oz (97.8 kg)    Physical Exam Vitals and nursing note reviewed.  Constitutional:      General: She is not in acute distress.    Appearance: She is well-developed. She is obese. She is not diaphoretic.     Comments: Well-appearing, comfortable, cooperative  HENT:     Head: Normocephalic and atraumatic.  Eyes:     General:        Right eye: No discharge.        Left eye: No discharge.     Conjunctiva/sclera: Conjunctivae normal.  Neck:     Thyroid: No thyromegaly.  Cardiovascular:     Rate and Rhythm: Normal rate and regular rhythm.     Heart sounds: Normal heart sounds. No murmur heard. Pulmonary:  Effort: Pulmonary effort is normal. No respiratory distress.     Breath sounds: Wheezing present. No rales.     Comments: Some hoarse voice and tight breath sounds Musculoskeletal:        General: Normal range of motion.     Cervical back: Normal range of motion and neck supple.     Right lower leg: Edema present.     Left lower leg: Edema present.  Lymphadenopathy:     Cervical: No cervical adenopathy.  Skin:    General: Skin is warm and dry.     Findings: No erythema or rash.  Neurological:     Mental Status: She is alert and oriented to person, place, and time.  Psychiatric:        Behavior: Behavior normal.     Comments: Well groomed, good eye contact, normal speech and thoughts    Diabetic Foot Exam - Simple   Simple Foot Form Diabetic Foot exam was performed with the following findings: Yes 08/09/2020  2:14 PM  Visual Inspection See  comments: Yes Sensation Testing Intact to touch and monofilament testing bilaterally: Yes Pulse Check Posterior Tibialis and Dorsalis pulse intact bilaterally: Yes Comments Intact monofilament. Dry skin with some callus formation great toe, onychomycosis L toe      Results for orders placed or performed in visit on 08/09/20  POCT UA - Microalbumin  Result Value Ref Range   Microalbumin Ur, POC 20 mg/L   Creatinine, POC     Albumin/Creatinine Ratio, Urine, POC        Assessment & Plan:   Problem List Items Addressed This Visit     Controlled type 2 diabetes mellitus with diabetic nephropathy, without long-term current use of insulin ([place])    Well controlled DM2 w/ A1c 6.6, at goal No hypoglycemia Complications - CKD-III stable, other including hyperlipidemia, GERD, depression, obesity - increases risk of future cardiovascular complications / poor glucose control due to reduced lifestyle diet/exercise with low energy mood and fatigue  Plan:  1. Remain diet controlled, off meds 2. Encourage improved lifestyle - low carb, low sugar diet, reduce portion size, continue improving regular exercise 3. Check CBG , bring log to next visit for review 4. Continue Statin 5. Urine micro today, negative 20 Request DM Eye       Relevant Orders   POCT UA - Microalbumin (Completed)   Centrilobular emphysema (HCC)   Relevant Medications   TRELEGY ELLIPTA 100-62.5-25 MCG/INH AEPB   albuterol (PROVENTIL) (2.5 MG/3ML) 0.083% nebulizer solution   predniSONE (DELTASONE) 20 MG tablet   Other Visit Diagnoses     COPD with acute exacerbation (HCC)    -  Primary   Relevant Medications   TRELEGY ELLIPTA 100-62.5-25 MCG/INH AEPB   albuterol (PROVENTIL) (2.5 MG/3ML) 0.083% nebulizer solution   levofloxacin (LEVAQUIN) 500 MG tablet   predniSONE (DELTASONE) 20 MG tablet   Mixed stress and urge urinary incontinence       Relevant Medications   mirabegron ER (MYRBETRIQ) 25 MG TB24 tablet   Other  Relevant Orders   Ambulatory referral to Urology   Onychomycosis of left great toe       Relevant Medications   terbinafine (LAMISIL) 250 MG tablet       AECOPD Recent history 04/2020 HFU with COPD flare Will aggressively treat current symptoms early flare Levaquin, Prednisone Use albuterol PRN, refill neb treatment Continue Trelegy, f/u with Pulm on Trelegy  Onychomycosis L Great Toe Thickening consistent on exam Trial on Terbinafine 250mg   daily 1-3 months  Mixed urinary incontinence LIkely both stress and urge component History of remote surgery bladder tack, has stress incontinence w/ cough/laugh Never on med Trial on Myrbetriq 25mg  daily today, if not covered may need alternative option Referral to [facility] for further consultation.  T2DM  Orders Placed This Encounter  Procedures   Ambulatory referral to Urology    Referral Priority:   Routine    Referral Type:   Consultation    Referral Reason:   Specialty Services Required    Requested Specialty:   Urology    Number of Visits Requested:   1   POCT UA - Microalbumin     Meds ordered this encounter  Medications   albuterol (PROVENTIL) (2.5 MG/3ML) 0.083% nebulizer solution    Sig: Take 3 mLs (2.5 mg total) by nebulization every 4 (four) hours as needed for wheezing or shortness of breath.    Dispense:  75 mL    Refill:  5   levofloxacin (LEVAQUIN) 500 MG tablet    Sig: Take 1 tablet (500 mg total) by mouth daily. For 7 days    Dispense:  7 tablet    Refill:  0   predniSONE (DELTASONE) 20 MG tablet    Sig: Take daily with food. Start with 60mg  (3 pills) x 2 days, then reduce to 40mg  (2 pills) x 2 days, then 20mg  (1 pill) x 3 days    Dispense:  13 tablet    Refill:  0   mirabegron ER (MYRBETRIQ) 25 MG TB24 tablet    Sig: Take 1 tablet (25 mg total) by mouth daily.    Dispense:  30 tablet    Refill:  2   terbinafine (LAMISIL) 250 MG tablet    Sig: Take 1 tablet (250 mg total) by mouth daily. For 1-3  months or until resolved.    Dispense:  30 tablet    Refill:  2     Follow up plan: Return in about 6 months (around 02/09/2021) for 6 month fasting lab only then 1 week later Annual Physical.  Future 02/05/21 labs  Nobie Putnam, [facility] 08/09/2020, 1:59 PM

## 2020-08-09 NOTE — Assessment & Plan Note (Signed)
Well controlled DM2 w/ A1c 6.6, at goal No hypoglycemia Complications - CKD-III stable, other including hyperlipidemia, GERD, depression, obesity - increases risk of future cardiovascular complications / poor glucose control due to reduced lifestyle diet/exercise with low energy mood and fatigue  Plan:  1. Remain diet controlled, off meds 2. Encourage improved lifestyle - low carb, low sugar diet, reduce portion size, continue improving regular exercise 3. Check CBG , bring log to next visit for review 4. Continue Statin 5. Urine micro today, negative 20 Request DM Eye

## 2020-08-09 NOTE — Patient Instructions (Addendum)
Thank you for coming to the office today.  For COPD Take the prednisone and levaquin antibiotic for 7 days  For the toenail, take terbinafine after finish those medicines for up to 3 months.  For bladder  [facility] -[address] [place],  [place]  [address] Phone: [phone]  Try the Myrbetriq 25mg  daily for incontinence.  -------------------------   DUE for FASTING BLOOD WORK (no food or drink after midnight before the lab appointment, only water or coffee without cream/sugar on the morning of)  SCHEDULE "Lab Only" visit in the morning at the clinic for lab draw in 6 MONTHS   - Make sure Lab Only appointment is at about 1 week before your next appointment, so that results will be available  For Lab Results, once available within 2-3 days of blood draw, you can can log in to MyChart online to view your results and a brief explanation. Also, we can discuss results at next follow-up visit.   Please schedule a Follow-up Appointment to: Return in about 6 months (around 02/09/2021) for 6 month fasting lab only then 1 week later Annual Physical.  If you have any other questions or concerns, please feel free to call the office or send a message through [place]. You may also schedule an earlier appointment if necessary.  Additionally, you may be receiving a survey about your experience at our office within a few days to 1 week by e-mail or mail. We value your feedback.  Nobie Putnam, DO [place]

## 2020-08-15 ENCOUNTER — Other Ambulatory Visit: Payer: Self-pay | Admitting: *Deleted

## 2020-08-15 ENCOUNTER — Encounter: Payer: Self-pay | Admitting: Urology

## 2020-08-15 ENCOUNTER — Other Ambulatory Visit
Admission: RE | Admit: 2020-08-15 | Discharge: 2020-08-15 | Disposition: A | Discharge status: Home / Self Care | Payer: Medicare Other | Attending: Urology | Admitting: Urology

## 2020-08-15 ENCOUNTER — Ambulatory Visit (INDEPENDENT_AMBULATORY_CARE_PROVIDER_SITE_OTHER): Payer: Medicare Other | Admitting: Urology

## 2020-08-15 ENCOUNTER — Other Ambulatory Visit: Payer: Self-pay

## 2020-08-15 VITALS — BP 147/74 | HR 65 | Ht 63.0 in | Wt 221.0 lb

## 2020-08-15 DIAGNOSIS — N3946 Mixed incontinence: Secondary | ICD-10-CM

## 2020-08-15 LAB — URINALYSIS, COMPLETE (UACMP) WITH MICROSCOPIC
Bacteria, UA: NONE SEEN
Bilirubin Urine: NEGATIVE
Glucose, UA: NEGATIVE mg/dL
Hgb urine dipstick: NEGATIVE
Ketones, ur: NEGATIVE mg/dL
Nitrite: NEGATIVE
Protein, ur: NEGATIVE mg/dL
Specific Gravity, Urine: 1.02 (ref 1.005–1.030)
pH: 5.5 (ref 5.0–8.0)

## 2020-08-15 NOTE — Progress Notes (Signed)
[id number] PM   Jenna [name] [id number]  CC: Mixed urinary incontinence  HPI: I saw Jenna [name] for mixed incontinence.  She is an 85 year old comorbid female with COPD who reports 4 to 5 months of worsening incontinence.  She has a history of a "bladder tack" surgery done in [place] over 10 years ago, and those notes are unavailable to me.  She reports incontinence when she coughs or sneezes, as well as urgency and urge incontinence.  She thinks the urgency and urge incontinence is the worst.  She drinks primarily water during the day.  She denies any gross hematuria or UTIs.  She has nocturia 2-3 times at night.  She was started on Myrbetriq last week by her PCP.  Urinalysis today is benign and PVR is normal at 20 mL.    PMH: Past Medical History:  Diagnosis Date   Anxiety    Arthritis    neck, knees(before replacements)   Asthma    CKD (chronic kidney disease), stage III (HCC)    COPD (chronic obstructive pulmonary disease) ([place])    Encounter for colonoscopy due to history of adenomatous colonic polyps    GERD (gastroesophageal reflux disease)    Hearing loss of both ears    History of echocardiogram    a. 10/2013 Echo: EF 60-65%, no rwma, midlly dil LA. Rnl RV fxn.   Hyperlipidemia    Osteoporosis    Palpitations    PSVT (paroxysmal supraventricular tachycardia) ([place])    a. 05/2018 [facility]: 99 SVT runs. Fastest 218 x 4:30. Longest 4:38 w/ rate of 172.   Sleep apnea    resolved with gastric bypass   Syncope and collapse    Urine incontinence     Surgical History: Past Surgical History:  Procedure Laterality Date   BROW LIFT Bilateral 06/27/2015   Procedure: BLEPHAROPLASTY BILATERAL UPPER EYELIDS BILATERAL BLEPHAROTOSIS EYELIDS;  Surgeon: Karle Starch, MD;  Location: [facility];  Service: Ophthalmology;  Laterality: Bilateral;  BILATERAL   CATARACT EXTRACTION W/PHACO Right 02/13/2015   Procedure: CATARACT EXTRACTION PHACO AND INTRAOCULAR LENS  PLACEMENT (IOC);  Surgeon: Ronnell Freshwater, MD;  Location: [facility];  Service: Ophthalmology;  Laterality: Right;   CATARACT EXTRACTION W/PHACO Left 03/22/2015   Procedure: CATARACT EXTRACTION PHACO AND INTRAOCULAR LENS PLACEMENT (IOC);  Surgeon: Ronnell Freshwater, MD;  Location: [facility];  Service: Ophthalmology;  Laterality: Left;  [place]   COLECTOMY     COSMETIC SURGERY  2012   tummy tuck and excess skin removal   GASTRIC BYPASS  2010   REPLACEMENT TOTAL KNEE BILATERAL  1998   SHOULDER SURGERY  2009   left    TONSILLECTOMY     TOTAL ABDOMINAL HYSTERECTOMY  1979    Family History: Family History  Problem Relation Age of Onset   Heart attack Mother    Hypertension Mother    Diabetes type II Mother    Pneumonia Father    Skin cancer Father    Diabetes type II Father    Colon cancer Sister    Ovarian cancer Sister    Heart attack Brother    Heart attack Sister 66   Hyperlipidemia Sister    Hypertension Sister     Social History:  reports that she quit smoking about 39 years ago. Her smoking use included cigarettes. She has a 20.00 pack-year smoking history. She has quit using smokeless tobacco. She reports previous alcohol use. She reports that  she does not use drugs.  Physical Exam: BP (!) 147/74   Pulse 65   Ht '5\' 3"'$  (1.6 m)   Wt 221 lb (100.2 kg)   BMI 39.15 kg/m    Constitutional:  Alert and oriented, No acute distress. Cardiovascular: No clubbing, cyanosis, or edema. Respiratory: Normal respiratory effort, no increased work of breathing. GI: Abdomen is soft, nontender, nondistended, no abdominal masses  Assessment & Plan:   85 year old comorbid female with mixed stress and urge incontinence, as well as history of a procedure for stress incontinence in the past.  She was recently started on Myrbetriq by her PCP, and she is not sure if this has improved her symptoms yet.  Urinalysis today is benign, PVR is  normal.  We discussed the complexity of managing incontinence in patients with prior bladder surgery, as well as mixed incontinence.  I recommended starting with Kegel exercises, consideration of a pessary with GYN in the future, or trial of different OAB medications.  Continue Myrbetriq Kegel exercises RTC with PA 3 months-> if persistent bothersome incontinence at that time would recommend evaluation with Dr. Arther Dames, or referral to GYN for pessary  Nickolas Madrid, MD 08/15/2020  [facility] [address], [place], [place] [address] [phone]

## 2020-08-15 NOTE — Patient Instructions (Signed)
Kegel Exercises  Kegel exercises can help strengthen your pelvic floor muscles. The pelvic floor is a group of muscles that support your rectum, small intestine, and bladder. In females, pelvic floor muscles also help support the womb (uterus). These muscles help you control the flow of urine and stool. Kegel exercises are painless and simple, and they do not require any equipment. Your provider may suggest Kegel exercises to: Improve bladder and bowel control. Improve sexual response. Improve weak pelvic floor muscles after surgery to remove the uterus (hysterectomy) or pregnancy (females). Improve weak pelvic floor muscles after prostate gland removal or surgery (males). Kegel exercises involve squeezing your pelvic floor muscles, which are the same muscles you squeeze when you try to stop the flow of urine or keep from passing gas. The exercises can be done while sitting, standing, or lying down, but itis best to vary your position. Exercises How to do Kegel exercises: Squeeze your pelvic floor muscles tight. You should feel a tight lift in your rectal area. If you are a female, you should also feel a tightness in your vaginal area. Keep your stomach, buttocks, and legs relaxed. Hold the muscles tight for up to 10 seconds. Breathe normally. Relax your muscles. Repeat as told by your health care provider. Repeat this exercise daily as told by your health care provider. Continue to do this exercise for at least 4-6 weeks, or for as long as told by your healthcare provider. You may be referred to a physical therapist who can help you learn more abouthow to do Kegel exercises. Depending on your condition, your health care provider may recommend: Varying how long you squeeze your muscles. Doing several sets of exercises every day. Doing exercises for several weeks. Making Kegel exercises a part of your regular exercise routine. This information is not intended to replace advice given to you by  your health care provider. Make sure you discuss any questions you have with your healthcare provider. Document Revised: 12/29/2019 Document Reviewed: 08/27/2017 Elsevier Patient Education  2022 [place].  Overactive Bladder, Adult  Overactive bladder is a condition in which a person has a sudden and frequent need to urinate. A person might also leak urine if he or she cannot get to the bathroom fast enough (urinary incontinence). Sometimes, symptoms can interfere with work or social activities. What are the causes? Overactive bladder is associated with poor nerve signals between your bladder and your brain. Your bladder may get the signal to empty before it is full. You may also have very sensitive muscles that make your bladder squeeze too soon. This condition may also be caused by other factors, such as: Medical conditions: Urinary tract infection. Infection of nearby tissues. Prostate enlargement. Bladder stones, inflammation, or tumors. Diabetes. Muscle or nerve weakness, especially from these conditions: A spinal cord injury. Stroke. Multiple sclerosis. Parkinson's disease. Other causes: Surgery on the uterus or urethra. Drinking too much caffeine or alcohol. Certain medicines, especially those that eliminate extra fluid in the body (diuretics). Constipation. What increases the risk? You may be at greater risk for overactive bladder if you: Are an older adult. Smoke. Are going through menopause. Have prostate problems. Have a neurological disease, such as stroke, dementia, Parkinson's disease, or multiple sclerosis (MS). Eat or drink alcohol, spicy food, caffeine, and other things that irritate the bladder. Are overweight or obese. What are the signs or symptoms? Symptoms of this condition include a sudden, strong urge to urinate. Other symptoms include: Leaking urine. Urinating 8 or more  times a day. Waking up to urinate 2 or more times overnight. How is this  diagnosed? This condition may be diagnosed based on: Your symptoms and medical history. A physical exam. Blood or urine tests to check for possible causes, such as infection. You may also need to see a health care provider who specializes in urinarytract problems. This is called a urologist. How is this treated? Treatment for overactive bladder depends on the cause of your condition and whether it is mild or severe. Treatment may include: Bladder training, such as: Learning to control the urge to urinate by following a schedule to urinate at regular intervals. Doing Kegel exercises to strengthen the pelvic floor muscles that support your bladder. Special devices, such as: Biofeedback. This uses sensors to help you become aware of your body's signals. Electrical stimulation. This uses electrodes placed inside the body (implanted) or outside the body. These electrodes send gentle pulses of electricity to strengthen the nerves or muscles that control the bladder. Women may use a plastic device, called a pessary, that fits into the vagina and supports the bladder. Medicines, such as: Antibiotics to treat bladder infection. Antispasmodics to stop the bladder from releasing urine at the wrong time. Tricyclic antidepressants to relax bladder muscles. Injections of botulinum toxin type A directly into the bladder tissue to relax bladder muscles. Surgery, such as: A device may be implanted to help manage the nerve signals that control urination. An electrode may be implanted to stimulate electrical signals in the bladder. A procedure may be done to change the shape of the bladder. This is done only in very severe cases. Follow these instructions at home: Eating and drinking  Make diet or lifestyle changes recommended by your health care provider. These may include: Drinking fluids throughout the day and not only with meals. Cutting down on caffeine or alcohol. Eating a healthy and balanced diet  to prevent constipation. This may include: Choosing foods that are high in fiber, such as beans, whole grains, and fresh fruits and vegetables. Limiting foods that are high in fat and processed sugars, such as fried and sweet foods.  Lifestyle  Lose weight if needed. Do not use any products that contain nicotine or tobacco. These include cigarettes, chewing tobacco, and vaping devices, such as e-cigarettes. If you need help quitting, ask your health care provider.  General instructions Take over-the-counter and prescription medicines only as told by your health care provider. If you were prescribed an antibiotic medicine, take it as told by your health care provider. Do not stop taking the antibiotic even if you start to feel better. Use any implants or pessary as told by your health care provider. If needed, wear pads to absorb urine leakage. Keep a log to track how much and when you drink, and when you need to urinate. This will help your health care provider monitor your condition. Keep all follow-up visits. This is important. Contact a health care provider if: You have a fever or chills. Your symptoms do not get better with treatment. Your pain and discomfort get worse. You have more frequent urges to urinate. Get help right away if: You are not able to control your bladder. Summary Overactive bladder refers to a condition in which a person has a sudden and frequent need to urinate. Several conditions may lead to an overactive bladder. Treatment for overactive bladder depends on the cause and severity of your condition. Making lifestyle changes, doing Kegel exercises, keeping a log, and taking medicines can help  with this condition. This information is not intended to replace advice given to you by your health care provider. Make sure you discuss any questions you have with your healthcare provider. Document Revised: 09/27/2019 Document Reviewed: 09/27/2019 Elsevier Patient  Education  [place].

## 2020-08-31 DIAGNOSIS — L858 Other specified epidermal thickening: Secondary | ICD-10-CM | POA: Diagnosis not present

## 2020-08-31 DIAGNOSIS — D1721 Benign lipomatous neoplasm of skin and subcutaneous tissue of right arm: Secondary | ICD-10-CM | POA: Diagnosis not present

## 2020-09-20 ENCOUNTER — Ambulatory Visit: Payer: Self-pay | Admitting: *Deleted

## 2020-09-20 NOTE — Telephone Encounter (Signed)
Daughter calling for the patient. Jenna [name] with Covid symptoms that began yesterday(daughter is positive but patient has not tested) including Chills, body aches, HA with sinus congestion, short-winded when up and moving, occasional cough and wheezing-she is using nebulizer, taking tylenol.  Care advice including increase water intake, continue tylenol for discomfort,  Deep breathing exercises often, isolate for 5 days Closely monitor breathing for any changes-seek immediate treatment at the ED. Vaccinated 2021, no boosters.  Requesting Paxlovid to pharmacy on file.Dr. Parks Ranger 1st available 9/6.  Patient is active with MyChart.

## 2020-09-21 ENCOUNTER — Other Ambulatory Visit: Payer: Self-pay

## 2020-09-21 ENCOUNTER — Telehealth: Payer: Self-pay | Admitting: Pharmacist

## 2020-09-21 MED ORDER — NIRMATRELVIR/RITONAVIR (PAXLOVID)TABLET
ORAL_TABLET | ORAL | 0 refills | Status: DC
  Filled 2020-09-21: qty 30, 5d supply, fill #0

## 2020-09-21 NOTE — Telephone Encounter (Signed)
See note yesterday on husband's chart regarding COVID19 positive  I talked to [facility] yesterday and directed her to speak with [facility] to get medication. No apt required.  Nobie Putnam, [facility] 09/21/2020, 10:33 AM

## 2020-09-21 NOTE — Telephone Encounter (Signed)
Outpatient Pharmacy Oral COVID Treatment Note  I connected with [place] on 0/09/6281/66:29 AM by telephone and verified that I am speaking with the correct person using two identifiers.  I spoke with her daughter, Yancey Flemings regarding her mother's care.  I discussed the limitations, risks, security, and privacy concerns of performing an evaluation and management service by telephone and the availability of in person appointments via referral to a physician. The patient expressed understanding and agreed to proceed.  Pharmacy location: [facility]  Diagnosis: COVID-19 infection  Purpose of visit: Discussion of potential use of Paxlovid, a new treatment for mild to moderate COVID-19 viral infection in non-hospitalized patients.  Subjective/Objective: Patient is a 85 y.o. female who is presenting with COVID 19 viral infection.  COVID 19 viral infection. Their symptoms began on 09/18/20 with Chills, body aches, HA with sinus congestion, short-winded when up and moving, occasional cough and wheezing-she is using nebulizer, taking tylenol.  The patient has confirmed COVID-19 via a 08/30/202 test   Past Medical History:  Diagnosis Date   Anxiety    Arthritis    neck, knees(before replacements)   Asthma    CKD (chronic kidney disease), stage III (HCC)    COPD (chronic obstructive pulmonary disease) ([place])    Encounter for colonoscopy due to history of adenomatous colonic polyps    GERD (gastroesophageal reflux disease)    Hearing loss of both ears    History of echocardiogram    a. 10/2013 Echo: EF 60-65%, no rwma, midlly dil LA. Rnl RV fxn.   Hyperlipidemia    Osteoporosis    Palpitations    PSVT (paroxysmal supraventricular tachycardia) ([place])    a. 05/2018 [facility]: 99 SVT runs. Fastest 218 x 4:30. Longest 4:38 w/ rate of 172.   Sleep apnea    resolved with gastric bypass   Syncope and collapse    Urine incontinence      Allergies  Allergen Reactions   Azithromycin  Other (See Comments)    Causes stomach burning pains   Hydromorphone Other (See Comments)    confusion, personality change   Morphine Other (See Comments)    Goes crazy   Nsaids Other (See Comments)    Avoids because of gastric bypass surgery   Oxycodone-Acetaminophen Nausea And Vomiting   Codeine Other (See Comments) and Nausea And Vomiting    "will not stay down"   Nabumetone Rash   Oxycodone Nausea And Vomiting   Oxycodone-Acetaminophen Nausea And Vomiting   Promethazine Hcl Rash and Other (See Comments)     Current Outpatient Medications:    albuterol (PROVENTIL) (2.5 MG/3ML) 0.083% nebulizer solution, Take 3 mLs (2.5 mg total) by nebulization every 4 (four) hours as needed for wheezing or shortness of breath., Disp: 75 mL, Rfl: 5   albuterol (VENTOLIN HFA) 108 (90 Base) MCG/ACT inhaler, Inhale 2 puffs into the lungs every 6 (six) hours as needed for wheezing or shortness of breath., Disp: 1 each, Rfl: 2   alendronate (FOSAMAX) 70 MG tablet, Take 1 tablet (70 mg total) by mouth once a week., Disp: 12 tablet, Rfl: 0   Ascorbic Acid (VITAMIN C) 1000 MG tablet, Take 500 mg by mouth daily. , Disp: , Rfl:    Cholecalciferol 25 MCG (1000 UT) tablet, Take 1,000 Units by mouth daily. , Disp: , Rfl:    citalopram (CELEXA) 40 MG tablet, Take 1 tablet (40 mg total) by mouth daily., Disp: 90 tablet, Rfl: 1   clotrimazole-betamethasone (LOTRISONE) cream, Apply 1-2 times  a day for worsening flare dry skin dermatitis of toes/feet, may re-use daily up to 1 week as needed., Disp: 30 g, Rfl: 0   Cyanocobalamin 1000 MCG TBCR, Take 1,000 mcg by mouth daily. , Disp: , Rfl:    diclofenac sodium (VOLTAREN) 1 % GEL, Apply 2 g topically 3 (three) times daily as needed. For hands, arthritis, Disp: 100 g, Rfl: 2   famotidine (PEPCID) 20 MG tablet, Take 20 mg by mouth 2 (two) times daily., Disp: , Rfl:    ferrous sulfate 325 (65 FE) MG EC tablet, Take 325 mg by mouth at bedtime. , Disp: , Rfl:    furosemide  (LASIX) 20 MG tablet, Take 1 tablet (20 mg total) by mouth daily as needed for fluid or edema. Take lasix daily, with extra dose as needed for swelling, Disp: 90 tablet, Rfl: 1   ipratropium (ATROVENT) 0.02 % nebulizer solution, Take 2.5 mLs (0.5 mg total) by nebulization every 6 (six) hours as needed for wheezing or shortness of breath., Disp: 900 mL, Rfl: 0   levofloxacin (LEVAQUIN) 500 MG tablet, Take 1 tablet (500 mg total) by mouth daily. For 7 days, Disp: 7 tablet, Rfl: 0   methocarbamol (ROBAXIN) 500 MG tablet, Take 1 tablet (500 mg total) by mouth every 8 (eight) hours as needed for muscle spasms., Disp: 60 tablet, Rfl: 2   metoprolol succinate (TOPROL-XL) 25 MG 24 hr tablet, Take 1 tablet (25 mg total) by mouth daily. Take an additional 12.5 mg - 25 mg as needed for palpitations, Disp: 90 tablet, Rfl: 3   mirabegron ER (MYRBETRIQ) 25 MG TB24 tablet, Take 1 tablet (25 mg total) by mouth daily., Disp: 30 tablet, Rfl: 2   Multiple Vitamins-Minerals (ONE-A-DAY WOMENS PETITES) TABS, Take 2 tablets by mouth every morning., Disp: , Rfl:    nirmatrelvir/ritonavir EUA (PAXLOVID) 20 x 150 MG & 10 x 100MG TABS, Take 1 nirmatrelvir tablet and 1 ritonavir tablet (2 tablets total) together twice daily for 5 days., Disp: 30 tablet, Rfl: 0   Omega-3 Fatty Acids (FISH OIL) 1000 MG CAPS, Take 1,000 mg by mouth daily., Disp: , Rfl:    predniSONE (DELTASONE) 20 MG tablet, Take daily with food. Start with 65m (3 pills) x 2 days, then reduce to 430m(2 pills) x 2 days, then 2023m1 pill) x 3 days, Disp: 13 tablet, Rfl: 0   simvastatin (ZOCOR) 40 MG tablet, Take 1 tablet (40 mg total) by mouth daily., Disp: 90 tablet, Rfl: 1   terbinafine (LAMISIL) 250 MG tablet, Take 1 tablet (250 mg total) by mouth daily. For 1-3 months or until resolved., Disp: 30 tablet, Rfl: 2   traZODone (DESYREL) 50 MG tablet, Take 1 tablet (50 mg total) by mouth at bedtime., Disp: 30 tablet, Rfl: 5   TRELEGY ELLIPTA 100-62.5-25 MCG/INH AEPB,  Inhale 1 puff into the lungs daily., Disp: , Rfl:   Lab Monitoring: eGFR 59  Drug Interactions Noted: Medications were reviewed and patient was counseled to hold simvastatin and trazodone while on paxlovid.   Plan:  This patient is a 85 53o. female that meets the criteria for Emergency Use Authorization of Paxlovid. After reviewing the emergency use authorization with the patient, the patient agrees to receive Paxlovid.  Through FDA guidance and current [place] standing order Paxlovid will be prescribed to the patient.   Patient contacted for counseling on 09/21/2020 and verbalized understanding.   Delivery or Pick-Up Date: 09/21/2020  Follow up instructions:    Take prescription  BID x 5 days as directed Counseling was provided by pharmacist. Reach out to pharmacist with follow up questions For concerns regarding further COVID symptoms please follow up with your PCP or urgent care For urgent or life-threatening issues, seek care at your local emergency department   Letta Median 09/21/2020, 10:38 AM [facility] Pharmacist Phone# [phone]

## 2020-10-02 ENCOUNTER — Telehealth: Payer: Self-pay

## 2020-10-02 ENCOUNTER — Telehealth: Payer: Medicare Other | Admitting: General Practice

## 2020-10-02 ENCOUNTER — Ambulatory Visit (INDEPENDENT_AMBULATORY_CARE_PROVIDER_SITE_OTHER): Payer: Medicare Other

## 2020-10-02 DIAGNOSIS — U071 COVID-19: Secondary | ICD-10-CM

## 2020-10-02 DIAGNOSIS — Z8616 Personal history of COVID-19: Secondary | ICD-10-CM | POA: Diagnosis not present

## 2020-10-02 DIAGNOSIS — J432 Centrilobular emphysema: Secondary | ICD-10-CM | POA: Diagnosis not present

## 2020-10-02 DIAGNOSIS — J441 Chronic obstructive pulmonary disease with (acute) exacerbation: Secondary | ICD-10-CM

## 2020-10-02 DIAGNOSIS — N3946 Mixed incontinence: Secondary | ICD-10-CM

## 2020-10-02 DIAGNOSIS — E1169 Type 2 diabetes mellitus with other specified complication: Secondary | ICD-10-CM

## 2020-10-02 DIAGNOSIS — R296 Repeated falls: Secondary | ICD-10-CM

## 2020-10-02 DIAGNOSIS — R0609 Other forms of dyspnea: Secondary | ICD-10-CM | POA: Diagnosis not present

## 2020-10-02 NOTE — Patient Instructions (Signed)
Visit Information  PATIENT GOALS:  Goals Addressed             This Visit's Progress    RNCM: Track and Manage My Symptoms-COPD       Timeframe:  Long-Range Goal Priority:  Medium Start Date:     02-03-2020                        Expected End Date:     02-19-2021               Follow Up Date 12-04-2020   - develop a rescue plan - eliminate symptom triggers at home - follow rescue plan if symptoms flare-up - keep follow-up appointments - use an extra pillow to sleep    Why is this important?   Tracking your symptoms and other information about your health helps your doctor plan your care.  Write down the symptoms, the time of day, what you were doing and what medicine you are taking.  You will soon learn how to manage your symptoms.     Notes: The patient has a good understanding of triggers to her COPD. 05-22-2020: The patient had a hospitalization for COPD exacerbation on 04-22-2020 to 04-28-2020. She is doing much better now. Medication changes have been helpful. She sees the pulmonologist on 05-31-2020. 07-31-2020: Has not had any new flare ups or COPD exacerbations. States she has been having to use her inhaler more. The patient states that she got a good report from her pulmonary provider. Denies any acute distress. 10-02-2020: The patient saw her pulmonary provider today post COVID positive. Her husband, daughter, and herself all had it.  She received Paxlovid and states she is feeling much better. Experiencing fatigue but states that she is getting there. Currently using her walker with wheels. Denies any new concerns.      RNCM: Track Symptoms-Urinary Incontinence       Timeframe:  Long-Range Goal Priority:  High Start Date:      07-31-2020                       Expected End Date:      07-31-2021                 Follow Up Date 12-04-2020    - track number of times I do not get to the bathroom in time - track when, how much I go    Why is this important?   There are many reasons  you may leak urine (pee). Keeping a diary of symptoms can help you figure out why. This will help you and your doctor come up with a treatment plan.    Notes: The patient states that she sometimes will stand up and the urine will just run down her leg. Has noticed it is progressively getting worse over the last 3 months. Advised the patient to write down questions to ask the pcp at upcoming appointment. Will continue to follow. 10-02-2020: About the same. Denies any acute distress. Will continue to monitor.         Patient verbalizes understanding of instructions provided today and agrees to view in [place].   Telephone follow up appointment with care management team member scheduled for: 12-04-2020 at 1 pm  Noreene Larsson RN, MSN, [place] Mobile: [phone]

## 2020-10-02 NOTE — Chronic Care Management (AMB) (Signed)
Chronic Care Management   CCM RN Visit Note  [id number] Name: Jenna [name] MRN: [id number] DOB: [id number]  Subjective: Jenna [name] is a 85 y.o. year old female who is a primary care patient of Olin Hauser, DO. The care management team was consulted for assistance with disease management and care coordination needs.    Engaged with patient by telephone for follow up visit in response to provider referral for case management and/or care coordination services.   Consent to Services:  The patient was given information about Chronic Care Management services, agreed to services, and gave verbal consent prior to initiation of services.  Please see initial visit note for detailed documentation.   Patient agreed to services and verbal consent obtained.   Assessment: Review of patient past medical history, allergies, medications, health status, including review of consultants reports, laboratory and other test data, was performed as part of comprehensive evaluation and provision of chronic care management services.   SDOH (Social Determinants of Health) assessments and interventions performed:  SDOH Interventions    Flowsheet Row Most Recent Value  SDOH Interventions   Food Insecurity Interventions Intervention Not Indicated  Housing Interventions Intervention Not Indicated  Intimate Partner Violence Interventions Intervention Not Indicated  Stress Interventions Intervention Not Indicated  Social Connections Interventions Intervention Not Indicated, Other (Comment)  [good support system]  Transportation Interventions Intervention Not Indicated        CCM Care Plan  Allergies  Allergen Reactions   Azithromycin Other (See Comments)    Causes stomach burning pains   Hydromorphone Other (See Comments)    confusion, personality change   Morphine Other (See Comments)    Goes crazy   Nsaids Other (See Comments)    Avoids because of gastric bypass surgery    Oxycodone-Acetaminophen Nausea And Vomiting   Codeine Other (See Comments) and Nausea And Vomiting    "will not stay down"   Nabumetone Rash   Oxycodone Nausea And Vomiting   Oxycodone-Acetaminophen Nausea And Vomiting   Promethazine Hcl Rash and Other (See Comments)    Outpatient Encounter Medications as of 10/02/2020  Medication Sig   albuterol (PROVENTIL) (2.5 MG/3ML) 0.083% nebulizer solution Take 3 mLs (2.5 mg total) by nebulization every 4 (four) hours as needed for wheezing or shortness of breath.   albuterol (VENTOLIN HFA) 108 (90 Base) MCG/ACT inhaler Inhale 2 puffs into the lungs every 6 (six) hours as needed for wheezing or shortness of breath.   alendronate (FOSAMAX) 70 MG tablet Take 1 tablet (70 mg total) by mouth once a week.   Ascorbic Acid (VITAMIN C) 1000 MG tablet Take 500 mg by mouth daily.    Cholecalciferol 25 MCG (1000 UT) tablet Take 1,000 Units by mouth daily.    citalopram (CELEXA) 40 MG tablet Take 1 tablet (40 mg total) by mouth daily.   clotrimazole-betamethasone (LOTRISONE) cream Apply 1-2 times a day for worsening flare dry skin dermatitis of toes/feet, may re-use daily up to 1 week as needed.   Cyanocobalamin 1000 MCG TBCR Take 1,000 mcg by mouth daily.    diclofenac sodium (VOLTAREN) 1 % GEL Apply 2 g topically 3 (three) times daily as needed. For hands, arthritis   famotidine (PEPCID) 20 MG tablet Take 20 mg by mouth 2 (two) times daily.   ferrous sulfate 325 (65 FE) MG EC tablet Take 325 mg by mouth at bedtime.    furosemide (LASIX) 20 MG tablet Take 1 tablet (20 mg total) by mouth daily as needed for  fluid or edema. Take lasix daily, with extra dose as needed for swelling   ipratropium (ATROVENT) 0.02 % nebulizer solution Take 2.5 mLs (0.5 mg total) by nebulization every 6 (six) hours as needed for wheezing or shortness of breath.   levofloxacin (LEVAQUIN) 500 MG tablet Take 1 tablet (500 mg total) by mouth daily. For 7 days   methocarbamol (ROBAXIN) 500 MG  tablet Take 1 tablet (500 mg total) by mouth every 8 (eight) hours as needed for muscle spasms.   metoprolol succinate (TOPROL-XL) 25 MG 24 hr tablet Take 1 tablet (25 mg total) by mouth daily. Take an additional 12.5 mg - 25 mg as needed for palpitations   mirabegron ER (MYRBETRIQ) 25 MG TB24 tablet Take 1 tablet (25 mg total) by mouth daily.   Multiple Vitamins-Minerals (ONE-A-DAY WOMENS PETITES) TABS Take 2 tablets by mouth every morning.   nirmatrelvir/ritonavir EUA (PAXLOVID) 20 x 150 MG & 10 x '100MG'$  TABS Take 1 nirmatrelvir tablet and 1 ritonavir tablet (2 tablets total) together twice daily for 5 days.   Omega-3 Fatty Acids (FISH OIL) 1000 MG CAPS Take 1,000 mg by mouth daily.   predniSONE (DELTASONE) 20 MG tablet Take daily with food. Start with '60mg'$  (3 pills) x 2 days, then reduce to '40mg'$  (2 pills) x 2 days, then '20mg'$  (1 pill) x 3 days   simvastatin (ZOCOR) 40 MG tablet Take 1 tablet (40 mg total) by mouth daily.   terbinafine (LAMISIL) 250 MG tablet Take 1 tablet (250 mg total) by mouth daily. For 1-3 months or until resolved.   traZODone (DESYREL) 50 MG tablet Take 1 tablet (50 mg total) by mouth at bedtime.   TRELEGY ELLIPTA 100-62.5-25 MCG/INH AEPB Inhale 1 puff into the lungs daily.   No facility-administered encounter medications on file as of 10/02/2020.    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 ([place]) 10/13/2018   Mild nonproliferative diabetic retinopathy of right eye without macular edema ([place]) 10/13/2018   Controlled type 2 diabetes mellitus with diabetic nephropathy, without long-term current use of insulin ([place]) 11/13/2017   Hyperlipidemia associated with type 2  diabetes mellitus ([place]) 11/13/2017   Depression, major, recurrent ([place]) 11/12/2017   Psychophysiological insomnia 11/12/2017   Primary osteoarthritis of both hands 11/12/2017   Primary osteoarthritis involving multiple joints 11/12/2017   Centrilobular emphysema ([place]) 11/12/2017   Obesity (BMI 35.0-39.9 without comorbidity) 11/13/2013   Esophageal reflux 11/13/2013   Bilateral leg edema 11/13/2013   Chronic cough 11/13/2013   Hx of adenomatous colonic polyps 10/15/2013    Conditions to be addressed/monitored:HLD, COPD, and falls and urinary incontience   Care Plan : RNCM: COPD (Adult)  Updates made by Vanita Ingles, RN since 10/02/2020 12:00 AM     Problem: RNCM: Psychological Adjustment to Diagnosis (COPD)   Priority: Medium     Long-Range Goal: RNCM: COPD Adjustment to Disease Achieved   Start Date: 02/03/2020  Expected End Date: 05/27/2021  This Visit's Progress: On track  Recent Progress: On track  Priority: Medium  Note:   Current Barriers:  Knowledge deficits related to basic understanding of COPD disease process Knowledge deficits related to basic COPD self care/management Knowledge deficit related to basic understanding of how to use inhalers and how  inhaled medications work Knowledge deficit related to importance of energy conservation Unable to independently manage COPD Does not contact provider office for questions/concerns  Case Manager Clinical Goal(s): patient will report using inhalers as prescribed including rinsing mouth after use patient will report utilizing pursed lip breathing for shortness of breath patient will verbalize understanding of COPD action plan and when to seek appropriate levels of medical care patient will engage in lite exercise as tolerated to build/regain stamina and strength and reduce shortness of breath through activity tolerance patient will verbalize basic understanding of COPD disease process and self care activities patient will not  be hospitalized for COPD exacerbation as evidenced  Interventions:  Collaboration with Olin Hauser, DO regarding development and update of comprehensive plan of care as evidenced by provider attestation and co-signature Inter-disciplinary care team collaboration (see longitudinal plan of care) Provided patient with basic written and verbal COPD education on self care/management/and exacerbation prevention  Provided patient with COPD action plan and reinforced importance of daily self assessment. The patient states that she saw the pulmonary provider in May and there were no changes to her medications regimen. She states she has had to use her inhalers more due to the hot weather. Has not had any new COPD exacerbations. 10-02-2020: The end of August the patient was COVID positive. States that she saw the pulmonary provider today and she is on the mend. She took Paxlovid and is feeling much better. States it made he has a steel taste in her mouth but other than that she did well with it. Feels like it helped her and was a better alternative than being "dead".  States she has been getting a lot of "junk" out of her lungs. She feels good except she is "tired". Education and support given. Is pacing activity and using a walker with wheels at this time. Denies any acute distress.  Provided written and verbal instructions on pursed lip breathing and utilized returned demonstration as teach back Provided instruction about proper use of medications used for management of COPD including inhalers Advised patient to self assesses COPD action plan zone and make appointment with provider if in the yellow zone for 48 hours without improvement. 10-02-2020: Aware of factors that exacerbate her conditions. Is proactive in her care. Provided patient with education about the role of exercise in the management of COPD Advised patient to engage in light exercise as tolerated 3-5 days a week Provided education about  and advised patient to utilize infection prevention strategies to reduce risk of respiratory infection. 07-31-2020: The patient's sister is hospitalized with COVID but she has not been around her sister. She states she is doing well and denies any issues or sx and sx of infection. Will continue to monitor. Sees pcp on [id number]. 10-02-2020: The patient,  her husband and her daughter all had COVID the end of August. She is doing much better now. States that she is following recommendations of pulmonary provider and pcp. Denies any new issues related to COPD at this time. Will continue to monitor.  Self-Care Activities:  Patient verbalizes understanding of plan to effectively manage COPD Self administers medications as prescribed Attends all scheduled provider appointments Calls pharmacy for medication refills Attends church or other social activities Performs ADL's independently Performs IADL's independently Calls provider office for new concerns or questions Patient Goals: - do breathing exercises at least 2 times each day - do exercises in a comfortable position that makes breathing as easy as possible - develop a  new routine to improve sleep - don't eat or exercise right before bedtime - eat healthy - get at least 7 to 8 hours of sleep at night - get outdoors every day (weather permitting) - keep room cool and dark - limit daytime naps - practice relaxation or meditation daily - use a fan or white noise in bedroom - use devices that will help like a cane, sock-puller or reacher - develop a rescue plan - eliminate symptom triggers at home - follow rescue plan if symptoms flare-up - keep follow-up appointments - avoid second hand smoke - eliminate smoking in my home - identify and avoid work-related triggers - identify and remove indoor air pollutants - limit outdoor activity during cold weather - listen for public air quality announcements every day Follow Up Plan: Telephone follow up  appointment with care management team member scheduled for: 11-14--2022 at 1 pm    Care Plan : RNCM: HLD Management  Updates made by Vanita Ingles, RN since 10/02/2020 12:00 AM     Problem: RNCM: Health Promotion or Disease Self-Management (General Plan of Care)   Priority: Medium     Long-Range Goal: RNCM: [id number] Plan Developed   Start Date: 02/03/2020  Expected End Date: 07/26/2021  This Visit's Progress: On track  Recent Progress: On track  Priority: Medium  Note:   Current Barriers:  Poorly controlled hyperlipidemia, complicated by COPD Current antihyperlipidemic regimen: Zocor 40 mg daily  Most recent lipid panel:     Component Value Date/Time   CHOL 170 01/31/2020 0756   TRIG 146 01/31/2020 0756   HDL 80 01/31/2020 0756   CHOLHDL 2.1 01/31/2020 0756   LDLCALC 67 01/31/2020 0756   ASCVD risk enhancing conditions: age >21, DM, former smoker, COPD Unable to independently manage HLD Does not contact provider office for questions/concerns RN Care Manager Clinical Goal(s):  patient will work with Consulting civil engineer, providers, and care team towards execution of optimized self-health management plan patient will verbalize understanding of plan for effective management of HLD patient will work with RNCM, pcp and CCM team  to address needs related to effective management of HLD patient will take all medications exactly as prescribed and will call provider for medication related questions patient will attend all scheduled medical appointments: 08-09-2020 at 1:40 pm- reviewed with the patient today patient will demonstrate improved health management independence patient will demonstrate understanding of rationale for each prescribed medication the patient will demonstrate ongoing self health care management ability Interventions: Collaboration with Olin Hauser, DO regarding development and update of comprehensive plan of care as evidenced by provider  attestation and co-signature Inter-disciplinary care team collaboration (see longitudinal plan of care) Medication review performed; medication list updated in electronic medical record.  Inter-disciplinary care team collaboration (see longitudinal plan of care) Referred to pharmacy team for assistance with HLD medication management Evaluation of current treatment plan related to HLD and patient's adherence to plan as established by provider. Advised patient to call the office for changes, questions, or concerns, also to write down questions to ask the pcp at the next appointment on 02-05-2021 Provided education to patient re: taking medications, compliance with heart healthy/ADA diet, adequate rest and safety  Reviewed medications with patient and discussed compliance. 10-02-2020: The patient is compliant with medications Reviewed scheduled/upcoming provider appointments including: 02-05-2021 at 0820 am Discussed plans with patient for ongoing care management follow up and provided patient with direct contact information for care management team Patient Goals/Self-Care Activities: - call for medicine  refill 2 or 3 days before it runs out - call if I am sick and can't take my medicine - keep a list of all the medicines I take; vitamins and herbals too - learn to read medicine labels - use a pillbox to sort medicine - use an alarm clock or phone to remind me to take my medicine - change to whole grain breads, cereal, pasta - drink 6 to 8 glasses of water each day - eat 3 to 5 servings of fruits and vegetables each day - eat 5 or 6 small meals each day - eat fish at least once per week - fill half the plate with nonstarchy vegetables - limit fast food meals to no more than 1 per week - manage portion size - prepare main meal at home 3 to 5 days each week - read food labels for fat, fiber, carbohydrates and portion size - reduce red meat to 2 to 3 times a week - be open to making changes - I  can manage, know and watch for signs of a heart attack - if I have chest pain, call for help - learn about small changes that will make a big difference - learn my personal risk factors  Follow Up Plan: Telephone follow up appointment with care management team member scheduled for: 12-04-2020 at 1 pm      Care Plan : RNCM: Falls prevention and safety  Updates made by Vanita Ingles, RN since 10/02/2020 12:00 AM     Problem: RNCM: Falls Prevention, Home and Family Safety (Wellness)   Priority: High  Note:   Last fall on 01-29-2020    Long-Range Goal: RNCM: Falls, Home and Family Safety Maintained   Start Date: 02/03/2020  Expected End Date: 01/23/2021  This Visit's Progress: On track  Recent Progress: On track  Priority: High  Note:   Current Barriers:  Knowledge Deficits related to fall precautions in patient with multiple chronic conditions and decreased mobility Decreased adherence to prescribed treatment for fall prevention Unable to independently manage falls in the home setting Does not adhere to provider recommendations re: use of DME in the home setting  Does not contact provider office for questions/concerns Knowledge Deficits related to fall preventions and safety in the home Does not use DME in the home, high fall risk patient Clinical Goal(s):   patient will demonstrate improved adherence to prescribed treatment plan for decreasing falls as evidenced by patient reporting and review of EMR  patient will verbalize using fall risk reduction strategies discussed  patient will not experience additional falls  patient will verbalize understanding of plan for having a safety plan in place at home to prevent new falls   patient will work with [facility], CCM team and pcp to address needs related to factors that may be contributing to falls in the home  patient will demonstrate a decrease in fall exacerbations as evidenced by no new falls or safety concerns   patient will attend all  scheduled medical appointments: 08-09-2020 at 1:40 pm patient will demonstrate understanding of rationale for each prescribed medication as evidenced by compliance and working with pharmacist if needed to understand medications and review of high safety medication concerns Interventions:  Collaboration with Parks Ranger, Devonne Doughty, DO regarding development and update of comprehensive plan of care as evidenced by provider attestation and co-signature Inter-disciplinary care team collaboration (see longitudinal plan of care) Provided written and verbal education re: Potential causes of falls and Fall prevention strategies. 07-31-2020: Reviewed safety  in the home and fall prevention. The patient denies falls with injuries but has had some "near misses". Empathetic listening and support given.  Reviewed medications and discussed potential side effects of medications such as dizziness and frequent urination Assessed for s/s of orthostatic hypotension Assessed for falls since last encounter. 05-22-2020: The patient denies any new falls. The patient is currently working with Outpatient Physical therapy 2 times a week for strengthening post hospitalization. 07-31-2020: Denies falls with injuries but states she has a lot of "near misses". Cannot remember the last times she almost fell. Review of safety concerns and the patient being at high risk for falls. The patient talked about her sister falling and being in the hospital. Empathetic listening and support. 10-02-2020: Denies any new falls. Endorses using her walker with wheels. States that she is just getting over [place]. Is being safe. Denies any new safety concerns. Will continue to monitor.  Assessed patients knowledge of fall risk prevention secondary to previously provided education. 07-31-2020: The patient states she is being mindful of her surroundings. Having increased issues with urinary incontinence that she will talk to pcp about on 08-09-2020. 10-02-2020: The  patient mindful of fall risk. No new concerns at this time. Will continue to monitor.  Assessed working status of life alert bracelet and patient adherence Provided patient information for fall alert systems Evaluation of current treatment plan related to falls and safety and patient's adherence to plan as established by provider. 05-22-2020: The patient is being safe and denies any new concerns related to safety and falls prevention. Will continue to monitor. 10-02-2020: Denies falls with injury. Review of being safe in her environment. Will continue to monitor.  Advised patient to call the office for new falls and discuss falls with the pcp at upcoming visit  Provided education to patient re: safety in the home and the use of DME to help with safety in the home  Reviewed medications with patient and discussed compliance and review of high risk medications  Discussed plans with patient for ongoing care management follow up and provided patient with direct contact information for care management team Reviewed scheduled/upcoming provider appointments including: 02-05-2021 at 0820 am  Patient Goals/Self-Care Activities patient will:   - Utilize walker, cane, or crutches (assistive device) appropriately with all ambulation - De-clutter walkways - Change positions slowly - Wear secure fitting shoes at all times with ambulation - Utilize home lighting for dim lit areas - Demonstrate self and pet awareness at all times - plan for unforeseen emergencies (disaster) reviewed - resources needed to improve safety identified - risks to home and environmental safety identified - strategies to improve or maintain safety promoted Follow Up Plan: Telephone follow up appointment with care management team member scheduled for: 12-04-2020 at 1 pm    Care Plan : RNCM: Urinary Incontinence (Adult)  Updates made by Vanita Ingles, RN since 10/02/2020 12:00 AM     Problem: RNCM: Symptom Management (Urinary  Incontinence)   Priority: High     Long-Range Goal: RNCM: Urinary Incontinence Symptoms Manged   Start Date: 07/31/2020  Expected End Date: 07/26/2021  This Visit's Progress: On track  Recent Progress: Not on track  Priority: High  Note:   Current Barriers:  Ineffective Self Health Maintenance in a patient with  urinary incontinence and frequency  Unable to independently manage new onset of urinary incontinence progressively getting worse over the last 3 months Unable to perform IADLs independently Does not contact provider office for questions/concerns Clinical  Goal(s):  Collaboration with Olin Hauser, DO regarding development and update of comprehensive plan of care as evidenced by provider attestation and co-signature Inter-disciplinary care team collaboration (see longitudinal plan of care) patient will work with care management team to address care coordination and chronic disease management needs related to Disease Management Educational Needs Medication Management and Education Level of Care Concerns Other new concerns with urinary incontinence and frequency   Interventions:  Evaluation of current treatment plan related to  urinary incontinence  ,  self-management and patient's adherence to plan as established by provider. 10-02-2020: The patient saw urologist on 08-15-2020. The patient had bladder surgery about 10 years ago in [place]. The patient given instructions on kegal exercises and to continue taking myrbetriq. She will follow up with urologist in about 3 months or sooner if worsening condition. Discussed a possibility of a pessary placed by GYN provider. Will continue to monitor.  Collaboration with Olin Hauser, DO regarding development and update of comprehensive plan of care as evidenced by provider attestation       and co-signature Inter-disciplinary care team collaboration (see longitudinal plan of care) Discussed plans with patient for ongoing  care management follow up and provided patient with direct contact information for care management team Assessed for new onset of urinary incontience. The patient states that sometimes when she stands up the urine just runs down her leg. She was inquiring about medications that may help. She states this started about 3 months ago and is progressively getting worse. Advised the patients to write down questions to ask the provider at upcoming visit oin 08-09-2020 at 1:40 pm. Education provided on safety, hygiene, and possibly needing a urology consult for further evaluation. Will continue to monitor. 10-02-2020: The patient is working with pcp and urologist. The patient will follow up with urology in October. Will continue to monitor. The patient to call for changes.  Self Care Activities:  Patient verbalizes understanding of plan to effectively manage urinary incontience and frequency  Self administers medications as prescribed Attends all scheduled provider appointments Calls pharmacy for medication refills Attends church or other social activities Performs ADL's independently Performs IADL's independently Calls provider office for new concerns or questions Patient Goals: Write down questions for the provider Prevent Uti by using proper hygiene  Practice safety and prevent falls Work with CCM team to optimize health and well being - healthy lifestyle promoted - incontinence product use encouraged - medication side effects managed - medication-adherence assessment completed - participation in physical therapy encouraged - practice of bladder-training techniques encouraged - practice of pelvic floor exercises encouraged - response to pharmacologic therapy monitored - strategies to manage symptom triggers promoted - symptom triggers identified  Follow Up Plan: Telephone follow up appointment with care management team member scheduled for: 12-04-2020 at 1 pm     Plan:Telephone follow up  appointment with care management team member scheduled for:  12-04-2020 at 1 pm  [place], MSN, [place] Mobile: [phone]

## 2020-10-02 NOTE — Telephone Encounter (Signed)
  Care Management   Follow Up Note   [id number] Name: Jenna [name] MRN: [id number] DOB: May 03, 1935   Referred by: Olin Hauser, DO Reason for referral : Chronic Care Management (RNCM: Follow up for Chronic Disease Management and Care Coordination Needs)   An unsuccessful telephone outreach was attempted today. The patient was referred to the case management team for assistance with care management and care coordination. Was able to return call back to the patient and call was completed. See new encounter.   Follow Up Plan: A HIPPA compliant phone message was left for the patient providing contact information and requesting a return call.   Noreene Larsson RN, MSN, [place] Mobile: [phone]

## 2020-10-20 DIAGNOSIS — E1169 Type 2 diabetes mellitus with other specified complication: Secondary | ICD-10-CM

## 2020-10-20 DIAGNOSIS — J441 Chronic obstructive pulmonary disease with (acute) exacerbation: Secondary | ICD-10-CM | POA: Diagnosis not present

## 2020-10-20 DIAGNOSIS — E785 Hyperlipidemia, unspecified: Secondary | ICD-10-CM

## 2020-10-24 ENCOUNTER — Other Ambulatory Visit: Payer: Self-pay

## 2020-10-24 DIAGNOSIS — F331 Major depressive disorder, recurrent, moderate: Secondary | ICD-10-CM

## 2020-10-24 DIAGNOSIS — E1169 Type 2 diabetes mellitus with other specified complication: Secondary | ICD-10-CM

## 2020-10-24 MED ORDER — CITALOPRAM HYDROBROMIDE 40 MG PO TABS: 40.0000 mg | ORAL_TABLET | Freq: Every day | ORAL | 1 refills | Status: DC

## 2020-10-24 MED ORDER — SIMVASTATIN 40 MG PO TABS: 40.0000 mg | ORAL_TABLET | Freq: Every day | ORAL | 1 refills | Status: DC

## 2020-10-26 ENCOUNTER — Encounter: Payer: Self-pay | Admitting: Physician Assistant

## 2020-10-26 ENCOUNTER — Ambulatory Visit: Payer: Medicare Other | Admitting: Physician Assistant

## 2020-10-26 ENCOUNTER — Other Ambulatory Visit: Payer: Self-pay

## 2020-10-26 VITALS — BP 126/82 | HR 90 | Ht 63.0 in | Wt 209.0 lb

## 2020-10-26 DIAGNOSIS — N8111 Cystocele, midline: Secondary | ICD-10-CM | POA: Diagnosis not present

## 2020-10-26 DIAGNOSIS — K529 Noninfective gastroenteritis and colitis, unspecified: Secondary | ICD-10-CM | POA: Diagnosis not present

## 2020-10-26 DIAGNOSIS — N3946 Mixed incontinence: Secondary | ICD-10-CM

## 2020-10-26 LAB — BLADDER SCAN AMB NON-IMAGING

## 2020-10-26 MED ORDER — MIRABEGRON ER 50 MG PO TB24: 50.0000 mg | ORAL_TABLET | Freq: Every day | ORAL | 0 refills | Status: DC

## 2020-10-26 NOTE — Progress Notes (Signed)
09/98/3382 5:05 PM   [name] [birth year] [id number]  CC: Chief Complaint  Patient presents with   Urinary Incontinence   HPI: Jenna [name] is a 85 y.o. female with PMH mixed incontinence, total hysterectomy, and "bladder tack" over 10 years ago who presents today for symptom recheck on Myrbetriq 25 mg daily and Kegel exercises.   Today she reports she has been performing Kegel exercises inconsistently.  She has been taking Myrbetriq, but is unsure how much this is helping with her urge incontinence.  She reports daytime frequency every 60 to 90 minutes, nocturia x1-2, and wears pull-ups around-the-clock, changing them at least once daily.  She states they are sometimes wet.  Notably, she is on diuretics and understands that this is contributing to her urinary frequency.  She states she feels her bladder has "fallen" despite previous surgical repair.  She states she first noticed this about 2 years ago, but it has been getting worse over time.  She also reports chronic diarrhea with urgency of stool.  She denies blood in her stool.  She has never seen GI for this.  PVR 0 mL.  PMH: Past Medical History:  Diagnosis Date   Anxiety    Arthritis    neck, knees(before replacements)   Asthma    CKD (chronic kidney disease), stage III (HCC)    COPD (chronic obstructive pulmonary disease) ([place])    Encounter for colonoscopy due to history of adenomatous colonic polyps    GERD (gastroesophageal reflux disease)    Hearing loss of both ears    History of echocardiogram    a. 10/2013 Echo: EF 60-65%, no rwma, midlly dil LA. Rnl RV fxn.   Hyperlipidemia    Osteoporosis    Palpitations    PSVT (paroxysmal supraventricular tachycardia) ([place])    a. 05/2018 [facility]: 99 SVT runs. Fastest 218 x 4:30. Longest 4:38 w/ rate of 172.   Sleep apnea    resolved with gastric bypass   Syncope and collapse    Urine incontinence     Surgical History: Past Surgical History:  Procedure Laterality  Date   BROW LIFT Bilateral 06/27/2015   Procedure: BLEPHAROPLASTY BILATERAL UPPER EYELIDS BILATERAL BLEPHAROTOSIS EYELIDS;  Surgeon: Karle Starch, MD;  Location: [facility];  Service: Ophthalmology;  Laterality: Bilateral;  BILATERAL   CATARACT EXTRACTION W/PHACO Right 02/13/2015   Procedure: CATARACT EXTRACTION PHACO AND INTRAOCULAR LENS PLACEMENT (IOC);  Surgeon: Ronnell Freshwater, MD;  Location: [facility];  Service: Ophthalmology;  Laterality: Right;   CATARACT EXTRACTION W/PHACO Left 03/22/2015   Procedure: CATARACT EXTRACTION PHACO AND INTRAOCULAR LENS PLACEMENT (IOC);  Surgeon: Ronnell Freshwater, MD;  Location: [facility];  Service: Ophthalmology;  Laterality: Left;  TORIC   CHOLECYSTECTOMY  1984   COLECTOMY     COSMETIC SURGERY  2012   tummy tuck and excess skin removal   GASTRIC BYPASS  2010   REPLACEMENT TOTAL KNEE BILATERAL  1998   SHOULDER SURGERY  2009   left    TONSILLECTOMY     TOTAL ABDOMINAL HYSTERECTOMY  1979    Home Medications:  Allergies as of 10/26/2020       Reactions   Azithromycin Other (See Comments)   Causes stomach burning pains   Hydromorphone Other (See Comments)   confusion, personality change   Morphine Other (See Comments)   Goes crazy   Nsaids Other (See Comments)   Avoids because of gastric bypass surgery   Oxycodone-acetaminophen Nausea And Vomiting  Codeine Other (See Comments), Nausea And Vomiting   "will not stay down"   Nabumetone Rash   Oxycodone Nausea And Vomiting   Oxycodone-acetaminophen Nausea And Vomiting   Promethazine Hcl Rash, Other (See Comments)        Medication List        Accurate as of October 26, 2020  2:21 PM. If you have any questions, ask your nurse or doctor.          albuterol 108 (90 Base) MCG/ACT inhaler Commonly known as: VENTOLIN HFA Inhale 2 puffs into the lungs every 6 (six) hours as needed for wheezing or shortness of breath.   albuterol (2.5 MG/3ML) 0.083%  nebulizer solution Commonly known as: PROVENTIL Take 3 mLs (2.5 mg total) by nebulization every 4 (four) hours as needed for wheezing or shortness of breath.   alendronate 70 MG tablet Commonly known as: FOSAMAX Take 1 tablet (70 mg total) by mouth once a week.   Cholecalciferol 25 MCG (1000 UT) tablet Take 1,000 Units by mouth daily.   citalopram 40 MG tablet Commonly known as: CELEXA Take 1 tablet (40 mg total) by mouth daily.   clotrimazole-betamethasone cream Commonly known as: LOTRISONE Apply 1-2 times a day for worsening flare dry skin dermatitis of toes/feet, may re-use daily up to 1 week as needed.   Cyanocobalamin 1000 MCG Tbcr Take 1,000 mcg by mouth daily.   diclofenac sodium 1 % Gel Commonly known as: VOLTAREN Apply 2 g topically 3 (three) times daily as needed. For hands, arthritis   famotidine 20 MG tablet Commonly known as: PEPCID Take 20 mg by mouth 2 (two) times daily.   ferrous sulfate 325 (65 FE) MG EC tablet Take 325 mg by mouth at bedtime.   Fish Oil 1000 MG Caps Take 1,000 mg by mouth daily.   furosemide 20 MG tablet Commonly known as: LASIX Take 1 tablet (20 mg total) by mouth daily as needed for fluid or edema. Take lasix daily, with extra dose as needed for swelling   ipratropium 0.02 % nebulizer solution Commonly known as: ATROVENT Take 2.5 mLs (0.5 mg total) by nebulization every 6 (six) hours as needed for wheezing or shortness of breath.   levofloxacin 500 MG tablet Commonly known as: LEVAQUIN Take 1 tablet (500 mg total) by mouth daily. For 7 days   methocarbamol 500 MG tablet Commonly known as: Robaxin Take 1 tablet (500 mg total) by mouth every 8 (eight) hours as needed for muscle spasms.   metoprolol succinate 25 MG 24 hr tablet Commonly known as: TOPROL-XL Take 1 tablet (25 mg total) by mouth daily. Take an additional 12.5 mg - 25 mg as needed for palpitations   mirabegron ER 50 MG Tb24 tablet Commonly known as:  MYRBETRIQ Take 1 tablet (50 mg total) by mouth daily. What changed:  medication strength how much to take Changed by: Jenna Loop, PA-C   One-A-Day Womens Petites Tabs Take 2 tablets by mouth every morning.   Paxlovid (300/100) 20 x 150 MG & 10 x 100MG  Tbpk Generic drug: nirmatrelvir & ritonavir Take 1 nirmatrelvir tablet and 1 ritonavir tablet (2 tablets total) together twice daily for 5 days.   predniSONE 20 MG tablet Commonly known as: DELTASONE Take daily with food. Start with 60mg  (3 pills) x 2 days, then reduce to 40mg  (2 pills) x 2 days, then 20mg  (1 pill) x 3 days   simvastatin 40 MG tablet Commonly known as: ZOCOR Take 1 tablet (40 mg total) by  mouth daily.   terbinafine 250 MG tablet Commonly known as: LAMISIL Take 1 tablet (250 mg total) by mouth daily. For 1-3 months or until resolved.   traZODone 50 MG tablet Commonly known as: DESYREL Take 1 tablet (50 mg total) by mouth at bedtime.   Trelegy Ellipta 100-62.5-25 MCG/INH Aepb Generic drug: Fluticasone-Umeclidin-Vilant Inhale 1 puff into the lungs daily.   vitamin C 1000 MG tablet Take 500 mg by mouth daily.        Allergies:  Allergies  Allergen Reactions   Azithromycin Other (See Comments)    Causes stomach burning pains   Hydromorphone Other (See Comments)    confusion, personality change   Morphine Other (See Comments)    Goes crazy   Nsaids Other (See Comments)    Avoids because of gastric bypass surgery   Oxycodone-Acetaminophen Nausea And Vomiting   Codeine Other (See Comments) and Nausea And Vomiting    "will not stay down"   Nabumetone Rash   Oxycodone Nausea And Vomiting   Oxycodone-Acetaminophen Nausea And Vomiting   Promethazine Hcl Rash and Other (See Comments)    Family History: Family History  Problem Relation Age of Onset   Heart attack Mother    Hypertension Mother    Diabetes type II Mother    Pneumonia Father    Skin cancer Father    Diabetes type II Father     Colon cancer Sister    Ovarian cancer Sister    Heart attack Brother    Heart attack Sister 71   Hyperlipidemia Sister    Hypertension Sister     Social History:   reports that she quit smoking about 39 years ago. Her smoking use included cigarettes. She has a 20.00 pack-year smoking history. She has quit using smokeless tobacco. She reports that she does not currently use alcohol. She reports that she does not use drugs.  Physical Exam: BP 126/82   Pulse 90   Ht 5\' 3"  (1.6 m)   Wt 209 lb (94.8 kg)   BMI 37.02 kg/m   Constitutional:  Alert and oriented, no acute distress, nontoxic appearing HEENT: [place], AT Cardiovascular: No clubbing, cyanosis, or edema Respiratory: Normal respiratory effort, no increased work of breathing Skin: No rashes, bruises or suspicious lesions Neurologic: Grossly intact, no focal deficits, moving all 4 extremities Psychiatric: Normal mood and affect  Laboratory Data: Results for orders placed or performed in visit on 10/26/20  Bladder Scan (Post Void Residual) in office  Result Value Ref Range   Scan Result 74mL    Assessment & Plan:   1. Mixed stress and urge urinary incontinence Unclear symptomatic improvement on Myrbetriq 25 mg and Kegel exercises.  We will increase Myrbetriq to 50 mg daily and plan for symptom recheck and PVR with Dr. Matilde Sprang in 1 month. - Bladder Scan (Post Void Residual) in office - mirabegron ER (MYRBETRIQ) 50 MG TB24 tablet; Take 1 tablet (50 mg total) by mouth daily.  Dispense: 28 tablet; Refill: 0  2. Cystocele, midline Per patient history, worsening despite prior bladder tack.  We discussed evaluation by Dr. Matilde Sprang for possible surgical options versus pessary placement referral.  3. Chronic diarrhea Associated with urgency of stool times many years.  Will refer to GI today. - Ambulatory referral to Gastroenterology   Return in about 4 weeks (around 11/23/2020) for Symptom recheck with PVR with Dr.  Matilde Sprang.  Jenna Loop, PA-C  [facility] [address] [place], [place] [address] [phone]

## 2020-11-08 DIAGNOSIS — H40003 Preglaucoma, unspecified, bilateral: Secondary | ICD-10-CM | POA: Diagnosis not present

## 2020-11-10 ENCOUNTER — Telehealth: Payer: Self-pay | Admitting: Family Medicine

## 2020-11-10 NOTE — Telephone Encounter (Signed)
Pt is calling back - She states she missed a call. Please advise CB- [phone]

## 2020-11-23 ENCOUNTER — Other Ambulatory Visit: Payer: Self-pay

## 2020-11-23 DIAGNOSIS — F5104 Psychophysiologic insomnia: Secondary | ICD-10-CM

## 2020-11-23 MED ORDER — TRAZODONE HCL 50 MG PO TABS: 50.0000 mg | ORAL_TABLET | Freq: Every day | ORAL | 5 refills | Status: DC

## 2020-11-27 ENCOUNTER — Ambulatory Visit: Payer: Medicare Other | Admitting: Urology

## 2020-11-28 ENCOUNTER — Encounter: Payer: Self-pay | Admitting: Urology

## 2020-12-04 ENCOUNTER — Telehealth: Payer: Medicare Other | Admitting: General Practice

## 2020-12-04 ENCOUNTER — Telehealth: Payer: Self-pay

## 2020-12-04 ENCOUNTER — Ambulatory Visit (INDEPENDENT_AMBULATORY_CARE_PROVIDER_SITE_OTHER): Payer: Medicare Other

## 2020-12-04 DIAGNOSIS — E1169 Type 2 diabetes mellitus with other specified complication: Secondary | ICD-10-CM

## 2020-12-04 DIAGNOSIS — E785 Hyperlipidemia, unspecified: Secondary | ICD-10-CM

## 2020-12-04 DIAGNOSIS — N3946 Mixed incontinence: Secondary | ICD-10-CM

## 2020-12-04 DIAGNOSIS — J441 Chronic obstructive pulmonary disease with (acute) exacerbation: Secondary | ICD-10-CM

## 2020-12-04 NOTE — Patient Instructions (Signed)
Visit Information  RNCM Clinical Goal(s):  Patient will verbalize understanding of plan for management of HLD, COPD, and urinary incontinence as evidenced by following the plan of care, taking medications as ordered, following dietary restrictions and working with the CCM team to optimize health and well being  take all medications exactly as prescribed and will call provider for medication related questions as evidenced by taking medications as ordered and calling for refills before running out    attend all scheduled medical appointments: 02-12-2021 at 220 pm as evidenced by keeping appointments and calling for needed appointment changes         demonstrate a decrease in HLD, COPD, and urinary incontinence  exacerbations  as evidenced by working with the CCM team to effectively manage health and well being demonstrate ongoing self health care management ability to effectively manage chronic conditions as evidenced by  working with the CCM team    through collaboration with Consulting civil engineer, provider, and care team.    Interventions: 1:1 collaboration with primary care provider regarding development and update of comprehensive plan of care as evidenced by provider attestation and co-signature Inter-disciplinary care team collaboration (see longitudinal plan of care) Evaluation of current treatment plan related to  self management and patient's adherence to plan as established by provider     SDOH Barriers (Status: Goal on Track (progressing): YES.) Long Term Goal  Patient interviewed and SDOH assessment performed        Patient interviewed and appropriate assessments performed Provided patient with information about resources available in the community and care guides available to assist with SDOH and new concerns Discussed plans with patient for ongoing care management follow up and provided patient with direct contact information for care management team Advised patient to call the office for  changes in SDOH, new needs or concerns       COPD: (Status: Goal on Track (progressing): YES.) Long Term Goal  Reviewed medications with patient, including use of prescribed maintenance and rescue inhalers, and provided instruction on medication management and the importance of adherence Provided patient with basic written and verbal COPD education on self care/management/and exacerbation prevention Advised patient to track and manage COPD triggers Provided written and verbal instructions on pursed lip breathing and utilized returned demonstration as teach back Provided instruction about proper use of medications used for management of COPD including inhalers Advised patient to self assesses COPD action plan zone and make appointment with provider if in the yellow zone for 48 hours without improvement Advised patient to engage in light exercise as tolerated 3-5 days a week to aid in the the management of COPD Provided education about and advised patient to utilize infection prevention strategies to reduce risk of respiratory infection Discussed the importance of adequate rest and management of fatigue with COPD Screening for signs and symptoms of depression related to chronic disease state  Assessed social determinant of health barriers   Urinary incontinence   (Status: Goal on Track (progressing): YES.) Long Term Goal  Evaluation of current treatment plan related to  urinary incontinence  ,  self-management and patient's adherence to plan as established by provider. Discussed plans with patient for ongoing care management follow up and provided patient with direct contact information for care management team Advised patient to provide appropriate vaccination information to provider or CM team member at next visit; Advised patient to call the office for changes in urinary incontinence and to keep appointments with the specialist ; Provided education to patient re:  hypgiene and staying clean,  calling the office for acute changes in urinary health and doing kegal exercises to strengthen pelvic floor; Reviewed medications with patient and discussed compliance ; Reviewed scheduled/upcoming provider appointments including 02-12-2021 at 220 pm; Discussed plans with patient for ongoing care management follow up and provided patient with direct contact information for care management team;   Hyperlipidemia:  (Status: Goal on Track (progressing): YES.) Long Term Goal       Lab Results  Component Value Date    CHOL 170 01/31/2020    HDL 80 01/31/2020    LDLCALC 67 01/31/2020    TRIG 146 01/31/2020    CHOLHDL 2.1 01/31/2020      Medication review performed; medication list updated in electronic medical record.  Provider established cholesterol goals reviewed; Counseled on importance of regular laboratory monitoring as prescribed; Provided HLD educational materials; Reviewed role and benefits of statin for ASCVD risk reduction; Discussed strategies to manage statin-induced myalgias; Reviewed importance of limiting foods high in cholesterol;   Patient Goals/Self-Care Activities: Patient will self administer medications as prescribed as evidenced by self report/primary caregiver report  Patient will attend all scheduled provider appointments as evidenced by clinician review of documented attendance to scheduled appointments and patient/caregiver report Patient will call pharmacy for medication refills as evidenced by patient report and review of pharmacy fill history as appropriate Patient will attend church or other social activities as evidenced by patient report Patient will continue to perform ADL's independently as evidenced by patient/caregiver report Patient will continue to perform IADL's independently as evidenced by patient/caregiver report Patient will call provider office for new concerns or questions as evidenced by review of documented incoming telephone call notes and  patient report Patient will work with BSW to address care coordination needs and will continue to work with the clinical team to address health care and disease management related needs as evidenced by documented adherence to scheduled care management/care coordination appointments - avoid second hand smoke - eliminate smoking in my home - identify and avoid work-related triggers - identify and remove indoor air pollutants - limit outdoor activity during cold weather - listen for public air quality announcements every day - do breathing exercises every day - develop a rescue plan - eliminate symptom triggers at home - follow rescue plan if symptoms flare-up - keep follow-up appointments: with pcp and specialist  - use an extra pillow to sleep - develop a new routine to improve sleep - don't eat or exercise right before bedtime - eat healthy/prescribed diet: heart healthy/ADA - get at least 7 to 8 hours of sleep at night - use devices that will help like a cane, sock-puller or reacher - practice relaxation or meditation daily - do exercises in a comfortable position that makes breathing as easy as possible - call for medicine refill 2 or 3 days before it runs out - take all medications exactly as prescribed - call doctor with any symptoms you believe are related to your medicine - call doctor when you experience any new symptoms - go to all doctor appointments as scheduled - adhere to prescribed diet: heart healthy/ADA  Patient verbalizes understanding of instructions provided today and agrees to view in [place].   Face to Face appointment with care management team member scheduled for:  02-12-2021 at 46 pm  Noreene Larsson RN, MSN, [place] Mobile: [phone]

## 2020-12-04 NOTE — Chronic Care Management (AMB) (Signed)
Chronic Care Management   CCM RN Visit Note  [id number] Name: Jenna [name] MRN: [id number] DOB: 06/27/2090  Subjective: Jenna [name] is a 85 y.o. year old female who is a primary care patient of Olin Hauser, DO. The care management team was consulted for assistance with disease management and care coordination needs.    Engaged with patient by telephone for follow up visit in response to provider referral for case management and/or care coordination services.   Consent to Services:  The patient was given information about Chronic Care Management services, agreed to services, and gave verbal consent prior to initiation of services.  Please see initial visit note for detailed documentation.   Patient agreed to services and verbal consent obtained.   Assessment: Review of patient past medical history, allergies, medications, health status, including review of consultants reports, laboratory and other test data, was performed as part of comprehensive evaluation and provision of chronic care management services.   SDOH (Social Determinants of Health) assessments and interventions performed:    CCM Care Plan  Allergies  Allergen Reactions   Azithromycin Other (See Comments)    Causes stomach burning pains   Hydromorphone Other (See Comments)    confusion, personality change   Morphine Other (See Comments)    Goes crazy   Nsaids Other (See Comments)    Avoids because of gastric bypass surgery   Oxycodone-Acetaminophen Nausea And Vomiting   Codeine Other (See Comments) and Nausea And Vomiting    "will not stay down"   Nabumetone Rash   Oxycodone Nausea And Vomiting   Oxycodone-Acetaminophen Nausea And Vomiting   Promethazine Hcl Rash and Other (See Comments)    Outpatient Encounter Medications as of 12/04/2020  Medication Sig   albuterol (PROVENTIL) (2.5 MG/3ML) 0.083% nebulizer solution Take 3 mLs (2.5 mg total) by nebulization every 4 (four) hours as needed  for wheezing or shortness of breath.   albuterol (VENTOLIN HFA) 108 (90 Base) MCG/ACT inhaler Inhale 2 puffs into the lungs every 6 (six) hours as needed for wheezing or shortness of breath.   alendronate (FOSAMAX) 70 MG tablet Take 1 tablet (70 mg total) by mouth once a week.   Ascorbic Acid (VITAMIN C) 1000 MG tablet Take 500 mg by mouth daily.    Cholecalciferol 25 MCG (1000 UT) tablet Take 1,000 Units by mouth daily.    citalopram (CELEXA) 40 MG tablet Take 1 tablet (40 mg total) by mouth daily.   clotrimazole-betamethasone (LOTRISONE) cream Apply 1-2 times a day for worsening flare dry skin dermatitis of toes/feet, may re-use daily up to 1 week as needed.   Cyanocobalamin 1000 MCG TBCR Take 1,000 mcg by mouth daily.    diclofenac sodium (VOLTAREN) 1 % GEL Apply 2 g topically 3 (three) times daily as needed. For hands, arthritis   famotidine (PEPCID) 20 MG tablet Take 20 mg by mouth 2 (two) times daily.   ferrous sulfate 325 (65 FE) MG EC tablet Take 325 mg by mouth at bedtime.    furosemide (LASIX) 20 MG tablet Take 1 tablet (20 mg total) by mouth daily as needed for fluid or edema. Take lasix daily, with extra dose as needed for swelling   ipratropium (ATROVENT) 0.02 % nebulizer solution Take 2.5 mLs (0.5 mg total) by nebulization every 6 (six) hours as needed for wheezing or shortness of breath.   levofloxacin (LEVAQUIN) 500 MG tablet Take 1 tablet (500 mg total) by mouth daily. For 7 days   methocarbamol (ROBAXIN) 500  MG tablet Take 1 tablet (500 mg total) by mouth every 8 (eight) hours as needed for muscle spasms.   metoprolol succinate (TOPROL-XL) 25 MG 24 hr tablet Take 1 tablet (25 mg total) by mouth daily. Take an additional 12.5 mg - 25 mg as needed for palpitations   mirabegron ER (MYRBETRIQ) 50 MG TB24 tablet Take 1 tablet (50 mg total) by mouth daily.   Multiple Vitamins-Minerals (ONE-A-DAY WOMENS PETITES) TABS Take 2 tablets by mouth every morning.   nirmatrelvir/ritonavir EUA  (PAXLOVID) 20 x 150 MG & 10 x 100MG  TABS Take 1 nirmatrelvir tablet and 1 ritonavir tablet (2 tablets total) together twice daily for 5 days.   Omega-3 Fatty Acids (FISH OIL) 1000 MG CAPS Take 1,000 mg by mouth daily.   predniSONE (DELTASONE) 20 MG tablet Take daily with food. Start with 60mg  (3 pills) x 2 days, then reduce to 40mg  (2 pills) x 2 days, then 20mg  (1 pill) x 3 days   simvastatin (ZOCOR) 40 MG tablet Take 1 tablet (40 mg total) by mouth daily.   terbinafine (LAMISIL) 250 MG tablet Take 1 tablet (250 mg total) by mouth daily. For 1-3 months or until resolved.   traZODone (DESYREL) 50 MG tablet Take 1 tablet (50 mg total) by mouth at bedtime.   TRELEGY ELLIPTA 100-62.5-25 MCG/INH AEPB Inhale 1 puff into the lungs daily.   No facility-administered encounter medications on file as of 12/04/2020.    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 ([place]) 10/13/2018   Mild nonproliferative diabetic retinopathy of right eye without macular edema ([place]) 10/13/2018   Controlled type 2 diabetes mellitus with diabetic nephropathy, without long-term current use of insulin ([place]) 11/13/2017   Hyperlipidemia associated with type 2 diabetes mellitus ([place]) 11/13/2017   Depression, major, recurrent ([place]) 11/12/2017   Psychophysiological insomnia 11/12/2017   Primary osteoarthritis of both hands 11/12/2017   Primary osteoarthritis involving multiple joints 11/12/2017   Centrilobular emphysema ([place]) 11/12/2017   Obesity (BMI 35.0-39.9 without comorbidity) 11/13/2013   Esophageal reflux 11/13/2013   Bilateral leg edema 11/13/2013   Chronic cough 11/13/2013   Hx of adenomatous colonic polyps  10/15/2013    Conditions to be addressed/monitored:HLD, COPD, and Urinary incontinence   Care Plan : RNCM: COPD (Adult)  Updates made by Vanita Ingles, RN since 12/04/2020 12:00 AM  Completed 12/04/2020   Problem: RNCM: Psychological Adjustment to Diagnosis (COPD) Resolved 12/04/2020  Priority: Medium     Long-Range Goal: RNCM: COPD Adjustment to Disease Achieved Completed 12/04/2020  Start Date: 02/03/2020  Expected End Date: 05/27/2021  Recent Progress: On track  Priority: Medium  Note:   Current Barriers: Resolving, duplicate goal  Knowledge deficits related to basic understanding of COPD disease process Knowledge deficits related to basic COPD self care/management Knowledge deficit related to basic understanding of how to use inhalers and how inhaled medications work Knowledge deficit related to importance of energy conservation Unable to independently manage COPD Does not contact provider office for questions/concerns  Case Manager Clinical Goal(s): patient will report using inhalers as prescribed including rinsing mouth after use patient will report utilizing pursed lip breathing for shortness of breath patient will verbalize understanding of COPD action plan and when to seek appropriate levels of medical  care patient will engage in lite exercise as tolerated to build/regain stamina and strength and reduce shortness of breath through activity tolerance patient will verbalize basic understanding of COPD disease process and self care activities patient will not be hospitalized for COPD exacerbation as evidenced  Interventions:  Collaboration with Parks Ranger Devonne Doughty, DO regarding development and update of comprehensive plan of care as evidenced by provider attestation and co-signature Inter-disciplinary care team collaboration (see longitudinal plan of care) Provided patient with basic written and verbal COPD education on self care/management/and exacerbation prevention   Provided patient with COPD action plan and reinforced importance of daily self assessment. The patient states that she saw the pulmonary provider in May and there were no changes to her medications regimen. She states she has had to use her inhalers more due to the hot weather. Has not had any new COPD exacerbations. 10-02-2020: The end of August the patient was COVID positive. States that she saw the pulmonary provider today and she is on the mend. She took Paxlovid and is feeling much better. States it made he has a steel taste in her mouth but other than that she did well with it. Feels like it helped her and was a better alternative than being "dead".  States she has been getting a lot of "junk" out of her lungs. She feels good except she is "tired". Education and support given. Is pacing activity and using a walker with wheels at this time. Denies any acute distress.  Provided written and verbal instructions on pursed lip breathing and utilized returned demonstration as teach back Provided instruction about proper use of medications used for management of COPD including inhalers Advised patient to self assesses COPD action plan zone and make appointment with provider if in the yellow zone for 48 hours without improvement. 10-02-2020: Aware of factors that exacerbate her conditions. Is proactive in her care. Provided patient with education about the role of exercise in the management of COPD Advised patient to engage in light exercise as tolerated 3-5 days a week Provided education about and advised patient to utilize infection prevention strategies to reduce risk of respiratory infection. 07-31-2020: The patient's sister is hospitalized with COVID but she has not been around her sister. She states she is doing well and denies any issues or sx and sx of infection. Will continue to monitor. Sees pcp on [phone]. 10-02-2020: The patient,  her husband and her daughter all had COVID the end of August. She is  doing much better now. States that she is following recommendations of pulmonary provider and pcp. Denies any new issues related to COPD at this time. Will continue to monitor.  Self-Care Activities:  Patient verbalizes understanding of plan to effectively manage COPD Self administers medications as prescribed Attends all scheduled provider appointments Calls pharmacy for medication refills Attends church or other social activities Performs ADL's independently Performs IADL's independently Calls provider office for new concerns or questions Patient Goals: - do breathing exercises at least 2 times each day - do exercises in a comfortable position that makes breathing as easy as possible - develop a new routine to improve sleep - don't eat or exercise right before bedtime - eat healthy - get at least 7 to 8 hours of sleep at night - get outdoors every day (weather permitting) - keep room cool and dark - limit daytime naps - practice relaxation or meditation daily - use a fan or white noise in bedroom - use devices that will help like  a cane, sock-puller or reacher - develop a rescue plan - eliminate symptom triggers at home - follow rescue plan if symptoms flare-up - keep follow-up appointments - avoid second hand smoke - eliminate smoking in my home - identify and avoid work-related triggers - identify and remove indoor air pollutants - limit outdoor activity during cold weather - listen for public air quality announcements every day Follow Up Plan: Telephone follow up appointment with care management team member scheduled for: 11-14--2022 at 1 pm    Care Plan : RNCM: HLD Management  Updates made by Vanita Ingles, RN since 12/04/2020 12:00 AM  Completed 12/04/2020   Problem: RNCM: Health Promotion or Disease Self-Management (General Plan of Care) Resolved 12/04/2020  Priority: Medium     Long-Range Goal: RNCM: DGL:[id number] Plan Developed Completed 12/04/2020  Start  Date: 02/03/2020  Expected End Date: 07/26/2021  Recent Progress: On track  Priority: Medium  Note:   Current Barriers: Resolving, duplicate goal  Poorly controlled hyperlipidemia, complicated by COPD Current antihyperlipidemic regimen: Zocor 40 mg daily  Most recent lipid panel:     Component Value Date/Time   CHOL 170 01/31/2020 0756   TRIG 146 01/31/2020 0756   HDL 80 01/31/2020 0756   CHOLHDL 2.1 01/31/2020 0756   LDLCALC 67 01/31/2020 0756   ASCVD risk enhancing conditions: age >21, DM, former smoker, COPD Unable to independently manage HLD Does not contact provider office for questions/concerns RN Care Manager Clinical Goal(s):  patient will work with Consulting civil engineer, providers, and care team towards execution of optimized self-health management plan patient will verbalize understanding of plan for effective management of HLD patient will work with RNCM, pcp and CCM team  to address needs related to effective management of HLD patient will take all medications exactly as prescribed and will call provider for medication related questions patient will attend all scheduled medical appointments: 08-09-2020 at 1:40 pm- reviewed with the patient today patient will demonstrate improved health management independence patient will demonstrate understanding of rationale for each prescribed medication the patient will demonstrate ongoing self health care management ability Interventions: Collaboration with Olin Hauser, DO regarding development and update of comprehensive plan of care as evidenced by provider attestation and co-signature Inter-disciplinary care team collaboration (see longitudinal plan of care) Medication review performed; medication list updated in electronic medical record.  Inter-disciplinary care team collaboration (see longitudinal plan of care) Referred to pharmacy team for assistance with HLD medication management Evaluation of current treatment plan  related to HLD and patient's adherence to plan as established by provider. Advised patient to call the office for changes, questions, or concerns, also to write down questions to ask the pcp at the next appointment on 02-05-2021 Provided education to patient re: taking medications, compliance with heart healthy/ADA diet, adequate rest and safety  Reviewed medications with patient and discussed compliance. 10-02-2020: The patient is compliant with medications Reviewed scheduled/upcoming provider appointments including: 02-05-2021 at 0820 am Discussed plans with patient for ongoing care management follow up and provided patient with direct contact information for care management team Patient Goals/Self-Care Activities: - call for medicine refill 2 or 3 days before it runs out - call if I am sick and can't take my medicine - keep a list of all the medicines I take; vitamins and herbals too - learn to read medicine labels - use a pillbox to sort medicine - use an alarm clock or phone to remind me to take my medicine - change to whole grain breads,  cereal, pasta - drink 6 to 8 glasses of water each day - eat 3 to 5 servings of fruits and vegetables each day - eat 5 or 6 small meals each day - eat fish at least once per week - fill half the plate with nonstarchy vegetables - limit fast food meals to no more than 1 per week - manage portion size - prepare main meal at home 3 to 5 days each week - read food labels for fat, fiber, carbohydrates and portion size - reduce red meat to 2 to 3 times a week - be open to making changes - I can manage, know and watch for signs of a heart attack - if I have chest pain, call for help - learn about small changes that will make a big difference - learn my personal risk factors  Follow Up Plan: Telephone follow up appointment with care management team member scheduled for: 12-04-2020 at 1 pm      Care Plan : RNCM: Falls prevention and safety  Updates made  by Vanita Ingles, RN since 12/04/2020 12:00 AM  Completed 12/04/2020   Problem: RNCM: Falls Prevention, Home and Family Safety (Wellness) Resolved 12/04/2020  Priority: High  Note:   Last fall on 01-29-2020    Long-Range Goal: RNCM: Falls, Home and Family Safety Maintained Completed 12/04/2020  Start Date: 02/03/2020  Expected End Date: 01/23/2021  Recent Progress: On track  Priority: High  Note:   Current Barriers: Resolved. The patient has went several months with no new falls. Will continue to monitor Knowledge Deficits related to fall precautions in patient with multiple chronic conditions and decreased mobility Decreased adherence to prescribed treatment for fall prevention Unable to independently manage falls in the home setting Does not adhere to provider recommendations re: use of DME in the home setting  Does not contact provider office for questions/concerns Knowledge Deficits related to fall preventions and safety in the home Does not use DME in the home, high fall risk patient Clinical Goal(s):   patient will demonstrate improved adherence to prescribed treatment plan for decreasing falls as evidenced by patient reporting and review of EMR  patient will verbalize using fall risk reduction strategies discussed  patient will not experience additional falls  patient will verbalize understanding of plan for having a safety plan in place at home to prevent new falls   patient will work with [facility], CCM team and pcp to address needs related to factors that may be contributing to falls in the home  patient will demonstrate a decrease in fall exacerbations as evidenced by no new falls or safety concerns   patient will attend all scheduled medical appointments: 08-09-2020 at 1:40 pm patient will demonstrate understanding of rationale for each prescribed medication as evidenced by compliance and working with pharmacist if needed to understand medications and review of high safety medication  concerns Interventions:  Collaboration with Parks Ranger, Devonne Doughty, DO regarding development and update of comprehensive plan of care as evidenced by provider attestation and co-signature Inter-disciplinary care team collaboration (see longitudinal plan of care) Provided written and verbal education re: Potential causes of falls and Fall prevention strategies. 07-31-2020: Reviewed safety in the home and fall prevention. The patient denies falls with injuries but has had some "near misses". Empathetic listening and support given.  Reviewed medications and discussed potential side effects of medications such as dizziness and frequent urination Assessed for s/s of orthostatic hypotension Assessed for falls since last encounter. 05-22-2020: The patient denies any  new falls. The patient is currently working with Outpatient Physical therapy 2 times a week for strengthening post hospitalization. 07-31-2020: Denies falls with injuries but states she has a lot of "near misses". Cannot remember the last times she almost fell. Review of safety concerns and the patient being at high risk for falls. The patient talked about her sister falling and being in the hospital. Empathetic listening and support. 10-02-2020: Denies any new falls. Endorses using her walker with wheels. States that she is just getting over [place]. Is being safe. Denies any new safety concerns. Will continue to monitor.  Assessed patients knowledge of fall risk prevention secondary to previously provided education. 07-31-2020: The patient states she is being mindful of her surroundings. Having increased issues with urinary incontinence that she will talk to pcp about on 08-09-2020. 10-02-2020: The patient mindful of fall risk. No new concerns at this time. Will continue to monitor.  Assessed working status of life alert bracelet and patient adherence Provided patient information for fall alert systems Evaluation of current treatment plan related to falls  and safety and patient's adherence to plan as established by provider. 05-22-2020: The patient is being safe and denies any new concerns related to safety and falls prevention. Will continue to monitor. 10-02-2020: Denies falls with injury. Review of being safe in her environment. Will continue to monitor.  Advised patient to call the office for new falls and discuss falls with the pcp at upcoming visit  Provided education to patient re: safety in the home and the use of DME to help with safety in the home  Reviewed medications with patient and discussed compliance and review of high risk medications  Discussed plans with patient for ongoing care management follow up and provided patient with direct contact information for care management team Reviewed scheduled/upcoming provider appointments including: 02-05-2021 at 0820 am  Patient Goals/Self-Care Activities patient will:   - Utilize walker, cane, or crutches (assistive device) appropriately with all ambulation - De-clutter walkways - Change positions slowly - Wear secure fitting shoes at all times with ambulation - Utilize home lighting for dim lit areas - Demonstrate self and pet awareness at all times - plan for unforeseen emergencies (disaster) reviewed - resources needed to improve safety identified - risks to home and environmental safety identified - strategies to improve or maintain safety promoted Follow Up Plan: Telephone follow up appointment with care management team member scheduled for: 12-04-2020 at 1 pm    Care Plan : RNCM: Urinary Incontinence (Adult)  Updates made by Vanita Ingles, RN since 12/04/2020 12:00 AM  Completed 12/04/2020   Problem: RNCM: Symptom Management (Urinary Incontinence) Resolved 12/04/2020  Priority: High     Long-Range Goal: RNCM: Urinary Incontinence Symptoms Manged Completed 12/04/2020  Start Date: 07/31/2020  Expected End Date: 07/26/2021  Recent Progress: On track  Priority: High  Note:    Current Barriers: Resolving, duplicate goal  Ineffective Self Health Maintenance in a patient with  urinary incontinence and frequency  Unable to independently manage new onset of urinary incontinence progressively getting worse over the last 3 months Unable to perform IADLs independently Does not contact provider office for questions/concerns Clinical Goal(s):  Collaboration with Olin Hauser, DO regarding development and update of comprehensive plan of care as evidenced by provider attestation and co-signature Inter-disciplinary care team collaboration (see longitudinal plan of care) patient will work with care management team to address care coordination and chronic disease management needs related to Disease Management Educational Needs Medication Management  and Education Level of Care Concerns Other new concerns with urinary incontinence and frequency   Interventions:  Evaluation of current treatment plan related to  urinary incontinence  ,  self-management and patient's adherence to plan as established by provider. 10-02-2020: The patient saw urologist on 08-15-2020. The patient had bladder surgery about 10 years ago in [place]. The patient given instructions on kegal exercises and to continue taking myrbetriq. She will follow up with urologist in about 3 months or sooner if worsening condition. Discussed a possibility of a pessary placed by GYN provider. Will continue to monitor.  Collaboration with Olin Hauser, DO regarding development and update of comprehensive plan of care as evidenced by provider attestation       and co-signature Inter-disciplinary care team collaboration (see longitudinal plan of care) Discussed plans with patient for ongoing care management follow up and provided patient with direct contact information for care management team Assessed for new onset of urinary incontience. The patient states that sometimes when she stands up the urine just  runs down her leg. She was inquiring about medications that may help. She states this started about 3 months ago and is progressively getting worse. Advised the patients to write down questions to ask the provider at upcoming visit oin 08-09-2020 at 1:40 pm. Education provided on safety, hygiene, and possibly needing a urology consult for further evaluation. Will continue to monitor. 10-02-2020: The patient is working with pcp and urologist. The patient will follow up with urology in October. Will continue to monitor. The patient to call for changes.  Self Care Activities:  Patient verbalizes understanding of plan to effectively manage urinary incontience and frequency  Self administers medications as prescribed Attends all scheduled provider appointments Calls pharmacy for medication refills Attends church or other social activities Performs ADL's independently Performs IADL's independently Calls provider office for new concerns or questions Patient Goals: Write down questions for the provider Prevent Uti by using proper hygiene  Practice safety and prevent falls Work with CCM team to optimize health and well being - healthy lifestyle promoted - incontinence product use encouraged - medication side effects managed - medication-adherence assessment completed - participation in physical therapy encouraged - practice of bladder-training techniques encouraged - practice of pelvic floor exercises encouraged - response to pharmacologic therapy monitored - strategies to manage symptom triggers promoted - symptom triggers identified  Follow Up Plan: Telephone follow up appointment with care management team member scheduled for: 12-04-2020 at 1 pm    Care Plan : [facility]; General Plan of Care (Adult) for Chronic Disease Management and Care Coordination Needs  Updates made by Vanita Ingles, RN since 12/04/2020 12:00 AM     Problem: RNCM: Development of Plan of Care For Chronic Disease Management (  COPD, HLD, Urinary Incontienence)   Priority: High     Long-Range Goal: RNCM: Development of Plan of Care For Chronic Disease Management ( COPD, HLD, Urinary Incontinence)   Start Date: 12/04/2020  Expected End Date: 12/04/2021  Priority: High  Note:   Current Barriers:  Knowledge Deficits related to plan of care for management of HLD, COPD, and Urinary Incontinence   Chronic Disease Management support and education needs related to HLD, COPD, and Urinary Incontinence  RNCM Clinical Goal(s):  Patient will verbalize understanding of plan for management of HLD, COPD, and urinary incontinence as evidenced by following the plan of care, taking medications as ordered, following dietary restrictions and working with the CCM team to optimize health and well being  take all medications exactly as prescribed and will call provider for medication related questions as evidenced by taking medications as ordered and calling for refills before running out    attend all scheduled medical appointments: 02-12-2021 at 220 pm as evidenced by keeping appointments and calling for needed appointment changes         demonstrate a decrease in HLD, COPD, and urinary incontinence  exacerbations  as evidenced by working with the CCM team to effectively manage health and well being demonstrate ongoing self health care management ability to effectively manage chronic conditions as evidenced by  working with the CCM team    through collaboration with Consulting civil engineer, provider, and care team.   Interventions: 1:1 collaboration with primary care provider regarding development and update of comprehensive plan of care as evidenced by provider attestation and co-signature Inter-disciplinary care team collaboration (see longitudinal plan of care) Evaluation of current treatment plan related to  self management and patient's adherence to plan as established by provider   SDOH Barriers (Status: Goal on Track (progressing): YES.)  Long Term Goal  Patient interviewed and SDOH assessment performed        Patient interviewed and appropriate assessments performed Provided patient with information about resources available in the community and care guides available to assist with SDOH and new concerns Discussed plans with patient for ongoing care management follow up and provided patient with direct contact information for care management team Advised patient to call the office for changes in SDOH, new needs or concerns    COPD: (Status: Goal on Track (progressing): YES.) Long Term Goal  Reviewed medications with patient, including use of prescribed maintenance and rescue inhalers, and provided instruction on medication management and the importance of adherence Provided patient with basic written and verbal COPD education on self care/management/and exacerbation prevention Advised patient to track and manage COPD triggers Provided written and verbal instructions on pursed lip breathing and utilized returned demonstration as teach back Provided instruction about proper use of medications used for management of COPD including inhalers Advised patient to self assesses COPD action plan zone and make appointment with provider if in the yellow zone for 48 hours without improvement Advised patient to engage in light exercise as tolerated 3-5 days a week to aid in the the management of COPD Provided education about and advised patient to utilize infection prevention strategies to reduce risk of respiratory infection Discussed the importance of adequate rest and management of fatigue with COPD Screening for signs and symptoms of depression related to chronic disease state  Assessed social determinant of health barriers  Urinary incontinence   (Status: Goal on Track (progressing): YES.) Long Term Goal  Evaluation of current treatment plan related to  urinary incontinence  ,  self-management and patient's adherence to plan as  established by provider. Discussed plans with patient for ongoing care management follow up and provided patient with direct contact information for care management team Advised patient to provide appropriate vaccination information to provider or CM team member at next visit; Advised patient to call the office for changes in urinary incontinence and to keep appointments with the specialist ; Provided education to patient re: hypgiene and staying clean, calling the office for acute changes in urinary health and doing kegal exercises to strengthen pelvic floor; Reviewed medications with patient and discussed compliance ; Reviewed scheduled/upcoming provider appointments including 02-12-2021 at 220 pm; Discussed plans with patient for ongoing care management follow up and provided patient with direct contact information  for care management team;  Hyperlipidemia:  (Status: Goal on Track (progressing): YES.) Long Term Goal  Lab Results  Component Value Date   CHOL 170 01/31/2020   HDL 80 01/31/2020   LDLCALC 67 01/31/2020   TRIG 146 01/31/2020   CHOLHDL 2.1 01/31/2020     Medication review performed; medication list updated in electronic medical record.  Provider established cholesterol goals reviewed; Counseled on importance of regular laboratory monitoring as prescribed; Provided HLD educational materials; Reviewed role and benefits of statin for ASCVD risk reduction; Discussed strategies to manage statin-induced myalgias; Reviewed importance of limiting foods high in cholesterol;  Patient Goals/Self-Care Activities: Patient will self administer medications as prescribed as evidenced by self report/primary caregiver report  Patient will attend all scheduled provider appointments as evidenced by clinician review of documented attendance to scheduled appointments and patient/caregiver report Patient will call pharmacy for medication refills as evidenced by patient report and review of  pharmacy fill history as appropriate Patient will attend church or other social activities as evidenced by patient report Patient will continue to perform ADL's independently as evidenced by patient/caregiver report Patient will continue to perform IADL's independently as evidenced by patient/caregiver report Patient will call provider office for new concerns or questions as evidenced by review of documented incoming telephone call notes and patient report Patient will work with BSW to address care coordination needs and will continue to work with the clinical team to address health care and disease management related needs as evidenced by documented adherence to scheduled care management/care coordination appointments - avoid second hand smoke - eliminate smoking in my home - identify and avoid work-related triggers - identify and remove indoor air pollutants - limit outdoor activity during cold weather - listen for public air quality announcements every day - do breathing exercises every day - develop a rescue plan - eliminate symptom triggers at home - follow rescue plan if symptoms flare-up - keep follow-up appointments: with pcp and specialist  - use an extra pillow to sleep - develop a new routine to improve sleep - don't eat or exercise right before bedtime - eat healthy/prescribed diet: heart healthy/ADA - get at least 7 to 8 hours of sleep at night - use devices that will help like a cane, sock-puller or reacher - practice relaxation or meditation daily - do exercises in a comfortable position that makes breathing as easy as possible - call for medicine refill 2 or 3 days before it runs out - take all medications exactly as prescribed - call doctor with any symptoms you believe are related to your medicine - call doctor when you experience any new symptoms - go to all doctor appointments as scheduled - adhere to prescribed diet: heart healthy/ADA       Plan:Telephone  follow up appointment with care management team member scheduled for:  02-12-2021 at 230 pm  [place], MSN, [place] [facility] Mobile: [phone]

## 2020-12-05 NOTE — Telephone Encounter (Signed)
  Care Management   Follow Up Note   [id number] Name: Jenna [name] MRN: [id number] DOB: December 31, 1935   Referred by: Olin Hauser, DO Reason for referral : Chronic Care Management (RNCM: Follow up for Chronic Disease Management and Care Coordination Needs )   Returned call back to the patient and was able to complete the call. See new encounter.   Follow Up Plan: Telephone follow up appointment with care management team member scheduled for:as scheduled  Noreene Larsson RN, MSN, [facility] Mobile: [phone]

## 2020-12-08 ENCOUNTER — Other Ambulatory Visit: Payer: Self-pay

## 2020-12-08 DIAGNOSIS — M85852 Other specified disorders of bone density and structure, left thigh: Secondary | ICD-10-CM

## 2020-12-08 MED ORDER — ALENDRONATE SODIUM 70 MG PO TABS
70.0000 mg | ORAL_TABLET | ORAL | 0 refills | Status: DC
Start: 2020-12-08 — End: 2021-05-17

## 2020-12-12 DIAGNOSIS — R0609 Other forms of dyspnea: Secondary | ICD-10-CM | POA: Diagnosis not present

## 2020-12-18 DIAGNOSIS — J449 Chronic obstructive pulmonary disease, unspecified: Secondary | ICD-10-CM | POA: Diagnosis not present

## 2020-12-20 DIAGNOSIS — E1169 Type 2 diabetes mellitus with other specified complication: Secondary | ICD-10-CM | POA: Diagnosis not present

## 2020-12-20 DIAGNOSIS — J441 Chronic obstructive pulmonary disease with (acute) exacerbation: Secondary | ICD-10-CM | POA: Diagnosis not present

## 2020-12-20 DIAGNOSIS — E785 Hyperlipidemia, unspecified: Secondary | ICD-10-CM

## 2020-12-21 ENCOUNTER — Other Ambulatory Visit: Payer: Self-pay

## 2020-12-21 DIAGNOSIS — B351 Tinea unguium: Secondary | ICD-10-CM

## 2020-12-21 MED ORDER — TERBINAFINE HCL 250 MG PO TABS: 250.0000 mg | ORAL_TABLET | Freq: Every day | ORAL | 2 refills | Status: DC

## 2020-12-28 ENCOUNTER — Ambulatory Visit: Payer: Medicare Other

## 2021-01-05 DIAGNOSIS — R296 Repeated falls: Secondary | ICD-10-CM | POA: Diagnosis not present

## 2021-01-05 DIAGNOSIS — J449 Chronic obstructive pulmonary disease, unspecified: Secondary | ICD-10-CM | POA: Diagnosis not present

## 2021-01-05 DIAGNOSIS — M1990 Unspecified osteoarthritis, unspecified site: Secondary | ICD-10-CM | POA: Diagnosis not present

## 2021-02-05 ENCOUNTER — Other Ambulatory Visit: Payer: Medicare Other

## 2021-02-05 ENCOUNTER — Telehealth: Payer: Medicare Other

## 2021-02-05 DIAGNOSIS — E1121 Type 2 diabetes mellitus with diabetic nephropathy: Secondary | ICD-10-CM | POA: Diagnosis not present

## 2021-02-05 DIAGNOSIS — J449 Chronic obstructive pulmonary disease, unspecified: Secondary | ICD-10-CM | POA: Diagnosis not present

## 2021-02-05 DIAGNOSIS — M1990 Unspecified osteoarthritis, unspecified site: Secondary | ICD-10-CM | POA: Diagnosis not present

## 2021-02-05 DIAGNOSIS — R296 Repeated falls: Secondary | ICD-10-CM | POA: Diagnosis not present

## 2021-02-05 DIAGNOSIS — R7989 Other specified abnormal findings of blood chemistry: Secondary | ICD-10-CM | POA: Diagnosis not present

## 2021-02-05 DIAGNOSIS — E1169 Type 2 diabetes mellitus with other specified complication: Secondary | ICD-10-CM

## 2021-02-05 DIAGNOSIS — E785 Hyperlipidemia, unspecified: Secondary | ICD-10-CM | POA: Diagnosis not present

## 2021-02-05 DIAGNOSIS — E669 Obesity, unspecified: Secondary | ICD-10-CM

## 2021-02-05 DIAGNOSIS — Z0000 Encounter for general adult medical examination without abnormal findings: Secondary | ICD-10-CM

## 2021-02-06 LAB — CBC WITH DIFFERENTIAL/PLATELET
Absolute Monocytes: 599 cells/uL (ref 200–950)
Basophils Absolute: 57 cells/uL (ref 0–200)
Basophils Relative: 0.7 %
Eosinophils Absolute: 361 cells/uL (ref 15–500)
Eosinophils Relative: 4.4 %
HCT: 39.7 % (ref 35.0–45.0)
Hemoglobin: 13.4 g/dL (ref 11.7–15.5)
Lymphs Abs: 3411 cells/uL (ref 850–3900)
MCH: 32.7 pg (ref 27.0–33.0)
MCHC: 33.8 g/dL (ref 32.0–36.0)
MCV: 96.8 fL (ref 80.0–100.0)
MPV: 11.2 fL (ref 7.5–12.5)
Monocytes Relative: 7.3 %
Neutro Abs: 3772 cells/uL (ref 1500–7800)
Neutrophils Relative %: 46 %
Platelets: 217 10*3/uL (ref 140–400)
RBC: 4.1 10*6/uL (ref 3.80–5.10)
RDW: 11.8 % (ref 11.0–15.0)
Total Lymphocyte: 41.6 %
WBC: 8.2 10*3/uL (ref 3.8–10.8)

## 2021-02-06 LAB — COMPLETE METABOLIC PANEL WITH GFR
AG Ratio: 1.8 (calc) (ref 1.0–2.5)
ALT: 25 U/L (ref 6–29)
AST: 26 U/L (ref 10–35)
Albumin: 4 g/dL (ref 3.6–5.1)
Alkaline phosphatase (APISO): 66 U/L (ref 37–153)
BUN/Creatinine Ratio: 19 (calc) (ref 6–22)
BUN: 19 mg/dL (ref 7–25)
CO2: 25 mmol/L (ref 20–32)
Calcium: 9.3 mg/dL (ref 8.6–10.4)
Chloride: 103 mmol/L (ref 98–110)
Creat: 1 mg/dL — ABNORMAL HIGH (ref 0.60–0.95)
Globulin: 2.2 g/dL (calc) (ref 1.9–3.7)
Glucose, Bld: 135 mg/dL (ref 65–139)
Potassium: 4.5 mmol/L (ref 3.5–5.3)
Sodium: 139 mmol/L (ref 135–146)
Total Bilirubin: 0.3 mg/dL (ref 0.2–1.2)
Total Protein: 6.2 g/dL (ref 6.1–8.1)
eGFR: 56 mL/min/{1.73_m2} — ABNORMAL LOW (ref >=60)

## 2021-02-06 LAB — TSH: TSH: 5.75 mIU/L — ABNORMAL HIGH (ref 0.40–4.50)

## 2021-02-06 LAB — HEMOGLOBIN A1C
Hgb A1c MFr Bld: 6.4 % of total Hgb — ABNORMAL HIGH (ref <5.7)
Mean Plasma Glucose: 137 mg/dL
eAG (mmol/L): 7.6 mmol/L

## 2021-02-06 LAB — LIPID PANEL
Cholesterol: 167 mg/dL (ref <200)
HDL: 72 mg/dL (ref >=50)
LDL Cholesterol (Calc): 69 mg/dL (calc)
Non-HDL Cholesterol (Calc): 95 mg/dL (calc) (ref <130)
Total CHOL/HDL Ratio: 2.3 (calc) (ref <5.0)
Triglycerides: 182 mg/dL — ABNORMAL HIGH (ref <150)

## 2021-02-06 LAB — T4, FREE: Free T4: 1.2 ng/dL (ref 0.8–1.8)

## 2021-02-08 DIAGNOSIS — M7052 Other bursitis of knee, left knee: Secondary | ICD-10-CM | POA: Diagnosis not present

## 2021-02-08 DIAGNOSIS — M7632 Iliotibial band syndrome, left leg: Secondary | ICD-10-CM | POA: Diagnosis not present

## 2021-02-08 DIAGNOSIS — Z96653 Presence of artificial knee joint, bilateral: Secondary | ICD-10-CM | POA: Diagnosis not present

## 2021-02-08 DIAGNOSIS — E113291 Type 2 diabetes mellitus with mild nonproliferative diabetic retinopathy without macular edema, right eye: Secondary | ICD-10-CM | POA: Diagnosis not present

## 2021-02-12 ENCOUNTER — Ambulatory Visit (INDEPENDENT_AMBULATORY_CARE_PROVIDER_SITE_OTHER): Payer: Medicare Other

## 2021-02-12 ENCOUNTER — Ambulatory Visit: Payer: Medicare Other

## 2021-02-12 ENCOUNTER — Other Ambulatory Visit: Payer: Self-pay

## 2021-02-12 ENCOUNTER — Encounter: Payer: Self-pay | Admitting: Family Medicine

## 2021-02-12 ENCOUNTER — Ambulatory Visit (INDEPENDENT_AMBULATORY_CARE_PROVIDER_SITE_OTHER): Payer: Medicare Other | Admitting: Family Medicine

## 2021-02-12 VITALS — BP 128/60 | HR 63 | Ht 63.0 in | Wt 216.0 lb

## 2021-02-12 DIAGNOSIS — I471 Supraventricular tachycardia, unspecified: Secondary | ICD-10-CM

## 2021-02-12 DIAGNOSIS — E113212 Type 2 diabetes mellitus with mild nonproliferative diabetic retinopathy with macular edema, left eye: Secondary | ICD-10-CM

## 2021-02-12 DIAGNOSIS — J432 Centrilobular emphysema: Secondary | ICD-10-CM

## 2021-02-12 DIAGNOSIS — E1121 Type 2 diabetes mellitus with diabetic nephropathy: Secondary | ICD-10-CM | POA: Diagnosis not present

## 2021-02-12 DIAGNOSIS — E1169 Type 2 diabetes mellitus with other specified complication: Secondary | ICD-10-CM | POA: Diagnosis not present

## 2021-02-12 DIAGNOSIS — Z23 Encounter for immunization: Secondary | ICD-10-CM

## 2021-02-12 DIAGNOSIS — F331 Major depressive disorder, recurrent, moderate: Secondary | ICD-10-CM

## 2021-02-12 DIAGNOSIS — E785 Hyperlipidemia, unspecified: Secondary | ICD-10-CM

## 2021-02-12 DIAGNOSIS — E113291 Type 2 diabetes mellitus with mild nonproliferative diabetic retinopathy without macular edema, right eye: Secondary | ICD-10-CM

## 2021-02-12 DIAGNOSIS — F5104 Psychophysiologic insomnia: Secondary | ICD-10-CM | POA: Diagnosis not present

## 2021-02-12 DIAGNOSIS — Z0000 Encounter for general adult medical examination without abnormal findings: Secondary | ICD-10-CM | POA: Diagnosis not present

## 2021-02-12 MED ORDER — TRAZODONE HCL 100 MG PO TABS: 100.0000 mg | ORAL_TABLET | Freq: Every day | ORAL | 3 refills | Status: DC

## 2021-02-12 MED ORDER — ALBUTEROL SULFATE HFA 108 (90 BASE) MCG/ACT IN AERS: 2.0000 | INHALATION_SPRAY | Freq: Four times a day (QID) | RESPIRATORY_TRACT | 3 refills | Status: DC | PRN

## 2021-02-12 NOTE — Assessment & Plan Note (Signed)
Controlled cholesterol on statin and lifestyle Last lipid panel 01/2021  Plan: 1. Continue current meds - Simvastatin 40mg  daily 2. Encourage improved lifestyle - low carb/cholesterol, reduce portion size, continue improving regular exercise

## 2021-02-12 NOTE — Assessment & Plan Note (Signed)
See A&P 

## 2021-02-12 NOTE — Assessment & Plan Note (Signed)
Partial remission vs moderate symptoms Anxiety Insomnia Continue SSRi future change therapy Increase Trazodone from 50 to 100mg 

## 2021-02-12 NOTE — Progress Notes (Signed)
Subjective:    Patient ID: Jenna [name], female    DOB: 1935/06/13, 86 y.o.   MRN: [id number]  Jenna [name] is a 86 y.o. female presenting on 02/12/2021 for Annual Exam   HPI   Here for Annual Physical and Lab Review.   CHRONIC DM, Type 2, w/ nephropathy / CKD-III Previous results A1c 6.1-6.4 Today A1c 6.3 No new concerns Meds: None Currently not on ACEi / ARB Lifestyle - Weight down 2 lbs - Diet: Improved diet - Exercise; Limited regular exercise but working on improving Followed by [facility] - frequently for injections in eyes for diabetes, no further injections. Denies hypoglycemia, polyuria, visual changes, numbness or tingling.   Insomnia / Chronic Depression, recurrent / Anxiety Reviewed last visit with background on depression. She now reports primary life stressor involves her daughter lives with her and her husband. Causing financial stress among other issues. Multiple recent stressors brother passed recently, and son had MI (he lives in [place] and comes to visit at times, increasing stress for her) she had acute episode of confusion when driving to [employer] recently, she could not remember - husband poor hearing, difficulty hearing, needs hearing aids. Asking about dose adjust on meds. Taking Citalopram 22m and Trazodone 544mnightly     Centrilobular Emphysema Followed by Dr FlHerbert Setaulm Controlled on Breo - Has had recurrent flare ups, and now has hoveround from pulm   Lower Extremity Edema Improved. New chair with able to elevate and has massager, and she can sleep in it as well, elevated legs to help reduce swelling.   Elevated TSH Lab showed TSH mild elevated 5 range but improved, and normal T4, NEVER on thyroid medication before. Asymptomatic     PSVT Stable without flares Followed by Cardiology  Health Maintenance: UTD PNA Due for Flu Shot, will receive today  COVID Booster due now, she will got to pharmacy. 2nd dose  04/07/20   [facility] 02/12/2021 10/02/2020 07/18/2020  Decreased Interest 1 0 0  Down, Depressed, Hopeless 2 0 0  PHQ - 2 Score 3 0 0  Altered sleeping 0 - -  Tired, decreased energy 3 - -  Change in appetite 1 - -  Feeling bad or failure about yourself  0 - -  Trouble concentrating 0 - -  Moving slowly or fidgety/restless 0 - -  Suicidal thoughts 0 - -  PHQ-9 Score 7 - -  Difficult doing work/chores Somewhat difficult - -  Some recent data might be hidden   GAD 7 : Generalized Anxiety Score 02/01/2019 02/13/2018 11/13/2017  Nervous, Anxious, on Edge 1 1 0  Control/stop worrying 0 0 0  Worry too much - different things 0 0 0  Trouble relaxing 1 0 1  Restless 0 0 1  Easily annoyed or irritable 0 1 1  Afraid - awful might happen 0 0 0  Total GAD 7 Score 2 2 3   Anxiety Difficulty Not difficult at all Somewhat difficult Not difficult at all      Past Medical History:  Diagnosis Date   Anxiety    Arthritis    neck, knees(before replacements)   Asthma    CKD (chronic kidney disease), stage III (HCC)    [place]D [place] obstructive pulmonary disease) [place]   Encounter for colonoscopy due to history of adenomatous colonic polyps    GERD (gastroesophageal reflux disease)    Hearing loss of both ears    History  of echocardiogram    a. 10/2013 Echo: EF 60-65%, no rwma, midlly dil LA. Rnl RV fxn.   Hyperlipidemia    Osteoporosis    Palpitations    PSVT (paroxysmal supraventricular tachycardia) ([place])    a. 05/2018 [facility]: 99 SVT runs. Fastest 218 x 4:30. Longest 4:38 w/ rate of 172.   Sleep apnea    resolved with gastric bypass   Syncope and collapse    Urine incontinence    Past Surgical History:  Procedure Laterality Date   BROW LIFT Bilateral 06/27/2015   Procedure: BLEPHAROPLASTY BILATERAL UPPER EYELIDS BILATERAL BLEPHAROTOSIS EYELIDS;  Surgeon: Karle Starch, MD;  Location: [place];  Service: Ophthalmology;  Laterality: Bilateral;  BILATERAL   CATARACT  EXTRACTION W/PHACO Right 02/13/2015   Procedure: CATARACT EXTRACTION PHACO AND INTRAOCULAR LENS PLACEMENT (IOC);  Surgeon: Ronnell Freshwater, MD;  Location: [place];  Service: Ophthalmology;  Laterality: Right;   CATARACT EXTRACTION W/PHACO Left 03/22/2015   Procedure: CATARACT EXTRACTION PHACO AND INTRAOCULAR LENS PLACEMENT (IOC);  Surgeon: Ronnell Freshwater, MD;  Location: [place];  Service: Ophthalmology;  Laterality: Left;  [place]   COLECTOMY     COSMETIC SURGERY  2012   tummy tuck and excess skin removal   GASTRIC BYPASS  2010   REPLACEMENT TOTAL KNEE BILATERAL  1998   SHOULDER SURGERY  2009   left    TONSILLECTOMY     TOTAL ABDOMINAL HYSTERECTOMY  1979   Social History   Socioeconomic History   Marital status: Married    Spouse name: Not on file   Number of children: Not on file   Years of education: Teacher, English as a foreign language    Highest education level: High school graduate  Occupational History   Not on file  Tobacco Use   Smoking status: Former    Packs/day: 1.00    Years: 20.00    Pack years: 20.00    Types: Cigarettes    Quit date: 07/1981    Years since quitting: 39.5   Smokeless tobacco: Former  Scientific laboratory technician Use: Never used  Substance and Sexual Activity   Alcohol use: Not Currently    Comment: past   Drug use: No   Sexual activity: Not on file  Other Topics Concern   Not on file  Social History Narrative   Not on file   Social Determinants of Health   Financial Resource Strain: Low Risk    Difficulty of Paying Living Expenses: Not hard at all  Food Insecurity: No Food Insecurity   Worried About Charity fundraiser in the Last Year: Never true   [place] in the Last Year: Never true  Transportation Needs: No Transportation Needs   Lack of Transportation (Medical): No   Lack of Transportation (Non-Medical): No  Physical Activity: Insufficiently Active   Days of Exercise per  Week: 2 days   Minutes of Exercise per Session: 60 min  Stress: Stress Concern Present   Feeling of Stress : To some extent  Social Connections: Moderately Integrated   Frequency of Communication with Friends and Family: More than three times a week   Frequency of Social Gatherings with Friends and Family: More than three times a week   Attends Religious Services: More than 4 times per year   Active Member of [employer] or Organizations: No   Attends Archivist Meetings: Never   Marital Status: Married  Human resources officer Violence:  Not At Risk   Fear of Current or Ex-Partner: No   Emotionally Abused: No   Physically Abused: No   Sexually Abused: No   Family History  Problem Relation Age of Onset   Heart attack Mother    Hypertension Mother    Diabetes type II Mother    Pneumonia Father    Skin cancer Father    Diabetes type II Father    Colon cancer Sister    Ovarian cancer Sister    Heart attack Brother    Heart attack Sister 50   Hyperlipidemia Sister    Hypertension Sister    Current Outpatient Medications on File Prior to Visit  Medication Sig   albuterol (PROVENTIL) (2.5 MG/3ML) 0.083% nebulizer solution Take 3 mLs (2.5 mg total) by nebulization every 4 (four) hours as needed for wheezing or shortness of breath.   alendronate (FOSAMAX) 70 MG tablet Take 1 tablet (70 mg total) by mouth once a week.   Ascorbic Acid (VITAMIN C) 1000 MG tablet Take 500 mg by mouth daily.    Cholecalciferol 25 MCG (1000 UT) tablet Take 1,000 Units by mouth daily.    citalopram (CELEXA) 40 MG tablet Take 1 tablet (40 mg total) by mouth daily.   clotrimazole-betamethasone (LOTRISONE) cream Apply 1-2 times a day for worsening flare dry skin dermatitis of toes/feet, may re-use daily up to 1 week as needed.   Cyanocobalamin 1000 MCG TBCR Take 1,000 mcg by mouth daily.    diclofenac sodium (VOLTAREN) 1 % GEL Apply 2 g topically 3 (three) times daily as needed. For hands, arthritis   famotidine  (PEPCID) 20 MG tablet Take 20 mg by mouth 2 (two) times daily.   ferrous sulfate 325 (65 FE) MG EC tablet Take 325 mg by mouth at bedtime.    furosemide (LASIX) 20 MG tablet Take 1 tablet (20 mg total) by mouth daily as needed for fluid or edema. Take lasix daily, with extra dose as needed for swelling   methocarbamol (ROBAXIN) 500 MG tablet Take 1 tablet (500 mg total) by mouth every 8 (eight) hours as needed for muscle spasms.   metoprolol succinate (TOPROL-XL) 25 MG 24 hr tablet Take 1 tablet (25 mg total) by mouth daily. Take an additional 12.5 mg - 25 mg as needed for palpitations   mirabegron ER (MYRBETRIQ) 50 MG TB24 tablet Take 1 tablet (50 mg total) by mouth daily.   Multiple Vitamins-Minerals (ONE-A-DAY WOMENS PETITES) TABS Take 2 tablets by mouth every morning.   Omega-3 Fatty Acids (FISH OIL) 1000 MG CAPS Take 1,000 mg by mouth daily.   simvastatin (ZOCOR) 40 MG tablet Take 1 tablet (40 mg total) by mouth daily.   terbinafine (LAMISIL) 250 MG tablet Take 1 tablet (250 mg total) by mouth daily. For 1-3 months or until resolved.   TRELEGY ELLIPTA 100-62.5-25 MCG/INH AEPB Inhale 1 puff into the lungs daily.   ipratropium (ATROVENT) 0.02 % nebulizer solution Take 2.5 mLs (0.5 mg total) by nebulization every 6 (six) hours as needed for wheezing or shortness of breath.   No current facility-administered medications on file prior to visit.    Review of Systems  Constitutional:  Negative for activity change, appetite change, chills, diaphoresis, fatigue and fever.  HENT:  Negative for congestion and hearing loss.   Eyes:  Negative for visual disturbance.  Respiratory:  Negative for cough, chest tightness, shortness of breath and wheezing.   Cardiovascular:  Negative for chest pain, palpitations and leg swelling.  Gastrointestinal:  Negative for abdominal pain, constipation, diarrhea, nausea and vomiting.  Genitourinary:  Negative for dysuria, frequency and hematuria.  Musculoskeletal:   Negative for arthralgias and neck pain.  Skin:  Negative for rash.  Neurological:  Negative for dizziness, weakness, light-headedness, numbness and headaches.  Hematological:  Negative for adenopathy.  Psychiatric/Behavioral:  Positive for sleep disturbance. Negative for behavioral problems and dysphoric mood. The patient is nervous/anxious.   Per HPI unless specifically indicated above      Objective:    BP 128/60    Pulse 63    Ht 5' 3"  (1.6 m)    Wt 216 lb (98 kg)    SpO2 100%    BMI 38.26 kg/m   Wt Readings from Last 3 Encounters:  02/12/21 216 lb (98 kg)  10/26/20 209 lb (94.8 kg)  08/15/20 221 lb (100.2 kg)    Physical Exam   Results for orders placed or performed in visit on 02/05/21  T4, free  Result Value Ref Range   Free T4 1.2 0.8 - 1.8 ng/dL  TSH  Result Value Ref Range   TSH 5.75 (H) 0.40 - 4.50 mIU/L  CBC with Differential/Platelet  Result Value Ref Range   WBC 8.2 3.8 - 10.8 Thousand/uL   RBC 4.10 3.80 - 5.10 Million/uL   Hemoglobin 13.4 11.7 - 15.5 g/dL   HCT 39.7 35.0 - 45.0 %   MCV 96.8 80.0 - 100.0 fL   MCH 32.7 27.0 - 33.0 pg   MCHC 33.8 32.0 - 36.0 g/dL   RDW 11.8 11.0 - 15.0 %   Platelets 217 140 - 400 Thousand/uL   MPV 11.2 7.5 - 12.5 fL   Neutro Abs 3,772 1,500 - 7,800 cells/uL   Lymphs Abs 3,411 850 - 3,900 cells/uL   Absolute Monocytes 599 200 - 950 cells/uL   Eosinophils Absolute 361 15 - 500 cells/uL   Basophils Absolute 57 0 - 200 cells/uL   Neutrophils Relative % 46 %   Total Lymphocyte 41.6 %   Monocytes Relative 7.3 %   Eosinophils Relative 4.4 %   Basophils Relative 0.7 %  Hemoglobin A1c  Result Value Ref Range   Hgb A1c MFr Bld 6.4 (H) <5.7 % of total Hgb   Mean Plasma Glucose 137 mg/dL   eAG (mmol/L) 7.6 mmol/L  Lipid panel  Result Value Ref Range   Cholesterol 167 <200 mg/dL   HDL 72 > OR = 50 mg/dL   Triglycerides 182 (H) <150 mg/dL   LDL Cholesterol (Calc) 69 mg/dL (calc)   Total CHOL/HDL Ratio 2.3 <5.0 (calc)    Non-HDL Cholesterol (Calc) 95 <130 mg/dL (calc)  COMPLETE METABOLIC PANEL WITH GFR  Result Value Ref Range   Glucose, Bld 135 65 - 139 mg/dL   BUN 19 7 - 25 mg/dL   Creat 1.00 (H) 0.60 - 0.95 mg/dL   eGFR 56 (L) > OR = 60 mL/min/1.16m   BUN/Creatinine Ratio 19 6 - 22 (calc)   Sodium 139 135 - 146 mmol/L   Potassium 4.5 3.5 - 5.3 mmol/L   Chloride 103 98 - 110 mmol/L   CO2 25 20 - 32 mmol/L   Calcium 9.3 8.6 - 10.4 mg/dL   Total Protein 6.2 6.1 - 8.1 g/dL   Albumin 4.0 3.6 - 5.1 g/dL   Globulin 2.2 1.9 - 3.7 g/dL (calc)   AG Ratio 1.8 1.0 - 2.5 (calc)   Total Bilirubin 0.3 0.2 - 1.2 mg/dL   Alkaline phosphatase (APISO) 66 37 - 153  U/L   AST 26 10 - 35 U/L   ALT 25 6 - 29 U/L      Assessment & Plan:   Problem List Items Addressed This Visit     Psychophysiological insomnia    See A&P      Relevant Medications   traZODone (DESYREL) 100 MG tablet   PSVT (paroxysmal supraventricular tachycardia) (HCC)    Controlled. 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 Continue current meds - Simvastatin 57m daily 2. Encourage improved lifestyle - low carb/cholesterol, reduce portion size, continue improving regular exercise       Depression, major, recurrent (HCC)    Partial remission vs moderate symptoms Anxiety Insomnia Continue SSRi future change therapy Increase Trazodone from 50 to 1049m     Relevant Medications   traZODone (DESYREL) 100 MG tablet   Controlled type 2 diabetes mellitus with diabetic nephropathy, without long-term current use of insulin (HCC)    Well controlled DM2 w/ A1c 6.4, at goal No  hypoglycemia Complications - CKD-III stable, other including hyperlipidemia, GERD, depression, obesity - increases risk of future cardiovascular complications / poor glucose control due to reduced lifestyle diet/exercise with low energy mood and fatigue  Plan:  1. Remain diet controlled, off meds 2. Encourage improved lifestyle - low carb, low sugar diet, reduce portion size, continue improving regular exercise 3. Check CBG , bring log to next visit for review 4. Continue Statin       Other Visit Diagnoses     Annual physical exam    -  Primary   Needs flu shot       Relevant Orders   Flu Vaccine QUAD High Dose(Fluad) (Completed)   Centrilobular emphysema (HCC)       Relevant Medications   albuterol (VENTOLIN HFA) 108 (90 Base) MCG/ACT inhaler       Updated Health Maintenance information Reviewed recent lab results with patient Encouraged improvement to lifestyle with diet and exercise Goal of weight loss    Meds ordered this encounter  Medications   traZODone (DESYREL) 100 MG tablet    Sig: Take 1 tablet (100 mg total) by mouth at bedtime.    Dispense:  90 tablet    Refill:  3   albuterol (VENTOLIN HFA) 108 (90 Base) MCG/ACT inhaler    Sig: Inhale 2 puffs into the lungs every 6 (six) hours as needed for wheezing or shortness of breath.    Dispense:  1 each    Refill:  3      Follow up plan: Return in about 3 months (around 05/13/2021) for 3 month follow-up DM A1c, COPD, Depression/Insomnia.  AlNobie PutnamDOAlbanyedical Group 02/12/2021, 2:50 PM

## 2021-02-12 NOTE — Patient Instructions (Addendum)
Thank you for coming to the office today.  Labs overall look good.  Increased Trazodone from 50 to 100mg ,  new pills ordered.  Future we can switch the Celexa if need Refills sent  Flu Shot ordered   Please schedule a Follow-up Appointment to: Return in about 3 months (around 05/13/2021) for 3 month follow-up DM A1c, COPD, Depression/Insomnia.  If you have any other questions or concerns, please feel free to call the office or send a message through [facility]. You may also schedule an earlier appointment if necessary.  Additionally, you may be receiving a survey about your experience at our office within a few days to 1 week by e-mail or mail. We value your feedback.  Nobie Putnam, DO [facility]

## 2021-02-12 NOTE — Assessment & Plan Note (Signed)
Controlled. Stable without flare up On medication management Followed by Cardiology

## 2021-02-12 NOTE — Chronic Care Management (AMB) (Signed)
Chronic Care Management   CCM RN Visit Note  1/61/0960 Name: Jenna [name] MRN: [id number] DOB: [birth year]  Subjective: Jenna [name] is a 86 y.o. year old female who is a primary care patient of Olin Hauser, DO. The care management team was consulted for assistance with disease management and care coordination needs.    Engaged with patient face to face for follow up visit in response to provider referral for case management and/or care coordination services.   Consent to Services:  The patient was given information about Chronic Care Management services, agreed to services, and gave verbal consent prior to initiation of services.  Please see initial visit note for detailed documentation.   Patient agreed to services and verbal consent obtained.   Assessment: Review of patient past medical history, allergies, medications, health status, including review of consultants reports, laboratory and other test data, was performed as part of comprehensive evaluation and provision of chronic care management services.   SDOH (Social Determinants of Health) assessments and interventions performed:    CCM Care Plan  Allergies  Allergen Reactions   Azithromycin Other (See Comments)    Causes stomach burning pains   Hydromorphone Other (See Comments)    confusion, personality change   Morphine Other (See Comments)    Goes crazy   Nsaids Other (See Comments)    Avoids because of gastric bypass surgery   Oxycodone-Acetaminophen Nausea And Vomiting   Codeine Other (See Comments) and Nausea And Vomiting    "will not stay down"   Nabumetone Rash   Oxycodone Nausea And Vomiting   Oxycodone-Acetaminophen Nausea And Vomiting   Promethazine Hcl Rash and Other (See Comments)    Outpatient Encounter Medications as of 02/12/2021  Medication Sig   albuterol (PROVENTIL) (2.5 MG/3ML) 0.083% nebulizer solution Take 3 mLs (2.5 mg total) by nebulization every 4 (four) hours as needed for  wheezing or shortness of breath.   alendronate (FOSAMAX) 70 MG tablet Take 1 tablet (70 mg total) by mouth once a week.   Ascorbic Acid (VITAMIN C) 1000 MG tablet Take 500 mg by mouth daily.    Cholecalciferol 25 MCG (1000 UT) tablet Take 1,000 Units by mouth daily.    citalopram (CELEXA) 40 MG tablet Take 1 tablet (40 mg total) by mouth daily.   clotrimazole-betamethasone (LOTRISONE) cream Apply 1-2 times a day for worsening flare dry skin dermatitis of toes/feet, may re-use daily up to 1 week as needed.   Cyanocobalamin 1000 MCG TBCR Take 1,000 mcg by mouth daily.    diclofenac sodium (VOLTAREN) 1 % GEL Apply 2 g topically 3 (three) times daily as needed. For hands, arthritis   famotidine (PEPCID) 20 MG tablet Take 20 mg by mouth 2 (two) times daily.   ferrous sulfate 325 (65 FE) MG EC tablet Take 325 mg by mouth at bedtime.    furosemide (LASIX) 20 MG tablet Take 1 tablet (20 mg total) by mouth daily as needed for fluid or edema. Take lasix daily, with extra dose as needed for swelling   ipratropium (ATROVENT) 0.02 % nebulizer solution Take 2.5 mLs (0.5 mg total) by nebulization every 6 (six) hours as needed for wheezing or shortness of breath.   methocarbamol (ROBAXIN) 500 MG tablet Take 1 tablet (500 mg total) by mouth every 8 (eight) hours as needed for muscle spasms.   metoprolol succinate (TOPROL-XL) 25 MG 24 hr tablet Take 1 tablet (25 mg total) by mouth daily. Take an additional 12.5 mg - 25 mg  as needed for palpitations   mirabegron ER (MYRBETRIQ) 50 MG TB24 tablet Take 1 tablet (50 mg total) by mouth daily.   Multiple Vitamins-Minerals (ONE-A-DAY WOMENS PETITES) TABS Take 2 tablets by mouth every morning.   Omega-3 Fatty Acids (FISH OIL) 1000 MG CAPS Take 1,000 mg by mouth daily.   simvastatin (ZOCOR) 40 MG tablet Take 1 tablet (40 mg total) by mouth daily.   terbinafine (LAMISIL) 250 MG tablet Take 1 tablet (250 mg total) by mouth daily. For 1-3 months or until resolved.   TRELEGY  ELLIPTA 100-62.5-25 MCG/INH AEPB Inhale 1 puff into the lungs daily.   [DISCONTINUED] albuterol (VENTOLIN HFA) 108 (90 Base) MCG/ACT inhaler Inhale 2 puffs into the lungs every 6 (six) hours as needed for wheezing or shortness of breath.   [DISCONTINUED] levofloxacin (LEVAQUIN) 500 MG tablet Take 1 tablet (500 mg total) by mouth daily. For 7 days   [DISCONTINUED] nirmatrelvir/ritonavir EUA (PAXLOVID) 20 x 150 MG & 10 x 100MG  TABS Take 1 nirmatrelvir tablet and 1 ritonavir tablet (2 tablets total) together twice daily for 5 days.   [DISCONTINUED] predniSONE (DELTASONE) 20 MG tablet Take daily with food. Start with 60mg  (3 pills) x 2 days, then reduce to 40mg  (2 pills) x 2 days, then 20mg  (1 pill) x 3 days   [DISCONTINUED] traZODone (DESYREL) 50 MG tablet Take 1 tablet (50 mg total) by mouth at bedtime.   No facility-administered encounter medications on file as of 02/12/2021.    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 ([place]) 10/13/2018   Mild nonproliferative diabetic retinopathy of right eye without macular edema ([place]) 10/13/2018   Controlled type 2 diabetes mellitus with diabetic nephropathy, without long-term current use of insulin ([place]) 11/13/2017   Hyperlipidemia associated with type 2 diabetes mellitus ([place]) 11/13/2017   Depression, major, recurrent ([place]) 11/12/2017   Psychophysiological insomnia 11/12/2017   Primary osteoarthritis of both hands 11/12/2017   Primary osteoarthritis involving multiple joints 11/12/2017   Obesity (BMI 35.0-39.9 without comorbidity) 11/13/2013   Esophageal reflux 11/13/2013   Bilateral leg edema 11/13/2013   Chronic cough 11/13/2013   Hx of adenomatous colonic polyps 10/15/2013     Conditions to be addressed/monitored:HLD, COPD, and Depression  Care Plan : RNCM; General Plan of Care (Adult) for Chronic Disease Management and Care Coordination Needs  Updates made by Vanita Ingles, RN since 02/12/2021 12:00 AM     Problem: RNCM: Development of Plan of Care For Chronic Disease Management ( COPD, HLD, Urinary Incontienence, Depression)   Priority: High     Long-Range Goal: RNCM: Development of Plan of Care For Chronic Disease Management ( COPD, HLD, Urinary Incontinence, Depression)   Start Date: 12/04/2020  Expected End Date: 12/04/2021  Priority: High  Note:   Current Barriers:  Knowledge Deficits related to plan of care for management of HLD, COPD, and Urinary Incontinence, Depression   Chronic Disease Management support and education needs related to HLD, COPD, and Urinary Incontinence, Depression   RNCM Clinical Goal(s):  Patient will verbalize understanding of plan for management of HLD, COPD, and urinary incontinence, Depression, as evidenced by following the plan of care, taking medications as ordered, following dietary restrictions and working with the CCM team to optimize health and well being  take all medications exactly  as prescribed and will call provider for medication related questions as evidenced by taking medications as ordered and calling for refills before running out    attend all scheduled medical appointments: Saw pcp 02-12-2021 at 220 pm, will follow up in 3 months as evidenced by keeping appointments and calling for needed appointment changes         demonstrate a decrease in HLD, COPD, and urinary incontinence, Depression,  exacerbations  as evidenced by working with the CCM team to effectively manage health and well being demonstrate ongoing self health care management ability to effectively manage chronic conditions as evidenced by  working with the CCM team    through collaboration with Consulting civil engineer, provider, and care team.    Interventions: 1:1 collaboration with primary care provider regarding development and update of comprehensive plan of care as evidenced by provider attestation and co-signature Inter-disciplinary care team collaboration (see longitudinal plan of care) Evaluation of current treatment plan related to  self management and patient's adherence to plan as established by provider   SDOH Barriers (Status: Goal on Track (progressing): YES.) Long Term Goal  Patient interviewed and SDOH assessment performed        Patient interviewed and appropriate assessments performed Provided patient with information about resources available in the community and care guides available to assist with SDOH and new concerns Discussed plans with patient for ongoing care management follow up and provided patient with direct contact information for care management team Advised patient to call the office for changes in [place], new needs or concerns    COPD: (Status: Goal on Track (progressing): YES.) Long Term Goal  Reviewed medications with patient, including use of prescribed maintenance and rescue inhalers, and provided instruction on medication management and the importance of adherence. 02-12-2021: The patient is compliant with medications. Refills for inhalers given by pcp today.  Provided patient with basic written and verbal COPD education on self care/management/and exacerbation prevention. 02-12-2021: The patient sees Dr. Raul Del for COPD management also. The patient does easily get short of breath with activity but not worse than usual. The patient had some scattered wheezes today on assessment. She knows how to effectively manage exacerbations. Will continue to monitor for changes.  Advised patient to track and manage COPD triggers.  Provided written and verbal instructions on pursed lip breathing and utilized returned demonstration as teach back Provided instruction about proper use of medications used for  management of COPD including inhalers Advised patient to self assesses COPD action plan zone and make appointment with provider if in the yellow zone for 48 hours without improvement Advised patient to engage in light exercise as tolerated 3-5 days a week to aid in the the management of COPD Provided education about and advised patient to utilize infection prevention strategies to reduce risk of respiratory infection Discussed the importance of adequate rest and management of fatigue with COPD Screening for signs and symptoms of depression related to chronic disease state  Assessed social determinant of health barriers  Urinary incontinence   (Status: Condition stable. Not addressed this visit.) Long Term Goal  Evaluation of current treatment plan related to  urinary incontinence  ,  self-management and patient's adherence to plan as established by provider. Discussed plans with patient for ongoing care management follow up and provided patient with direct contact information for care management team Advised patient to provide appropriate vaccination information to provider or CM team member at next visit; Advised patient to call the office for changes in  urinary incontinence and to keep appointments with the specialist ; Provided education to patient re: hypgiene and staying clean, calling the office for acute changes in urinary health and doing kegal exercises to strengthen pelvic floor; Reviewed medications with patient and discussed compliance ; Reviewed scheduled/upcoming provider appointments including 02-12-2021 at 220 pm; Discussed plans with patient for ongoing care management follow up and provided patient with direct contact information for care management team;  Hyperlipidemia:  (Status: Goal on Track (progressing): YES.) Long Term Goal  Lab Results  Component Value Date   CHOL 167 02/05/2021   HDL 72 02/05/2021   LDLCALC 69 02/05/2021   TRIG 182 (H) 02/05/2021   CHOLHDL 2.3  02/05/2021     Medication review performed; medication list updated in electronic medical record. 02-12-2021: The patient is compliant with medications.  Provider established cholesterol goals reviewed. 02-12-2021: Review of goals. Patient is stable with labs; Counseled on importance of regular laboratory monitoring as prescribed. 02-12-2021: The patient is compliant with lab testing Provided HLD educational materials; Reviewed role and benefits of statin for ASCVD risk reduction; Discussed strategies to manage statin-induced myalgias; Reviewed importance of limiting foods high in cholesterol;   Depression  (Status: Goal on Track (progressing): YES.) Long Term Goal  Evaluation of current treatment plan related to Depression, Mental Health Concerns  self-management and patient's adherence to plan as established by provider. Discussed plans with patient for ongoing care management follow up and provided patient with direct contact information for care management team Advised patient to call the office for changes in mood, anxiety, stress, or depression ; Provided education to patient re: evaluation of factors creating stress for her. She states her son had a heart attack and is not doing what he is supposed to do and her daughter that lives with her treats her as if she is the child. She states there are things that cause her to really be stressed out and this impacts her sleeping. Pcp changed trazadone and will follow up for changes. Reflective listening and support; Reviewed medications with patient and discussed compliance. The patient is compliant with medications. Changes made today and will follow up accordingly; Reviewed scheduled/upcoming provider appointments including 3 months to see pcp for evaluation and ongoing treatment and support; Discussed plans with patient for ongoing care management follow up and provided patient with direct contact information for care management team;   Patient  Goals/Self-Care Activities: Patient will self administer medications as prescribed as evidenced by self report/primary caregiver report  Patient will attend all scheduled provider appointments as evidenced by clinician review of documented attendance to scheduled appointments and patient/caregiver report Patient will call pharmacy for medication refills as evidenced by patient report and review of pharmacy fill history as appropriate Patient will attend church or other social activities as evidenced by patient report Patient will continue to perform ADL's independently as evidenced by patient/caregiver report Patient will continue to perform IADL's independently as evidenced by patient/caregiver report Patient will call provider office for new concerns or questions as evidenced by review of documented incoming telephone call notes and patient report Patient will work with BSW to address care coordination needs and will continue to work with the clinical team to address health care and disease management related needs as evidenced by documented adherence to scheduled care management/care coordination appointments - avoid second hand smoke - eliminate smoking in my home - identify and avoid work-related triggers - identify and remove indoor air pollutants - limit outdoor activity during cold weather -  listen for public air quality announcements every day - do breathing exercises every day - develop a rescue plan - eliminate symptom triggers at home - follow rescue plan if symptoms flare-up - keep follow-up appointments: with pcp and specialist  - use an extra pillow to sleep - develop a new routine to improve sleep - don't eat or exercise right before bedtime - eat healthy/prescribed diet: heart healthy/ADA - get at least 7 to 8 hours of sleep at night - use devices that will help like a cane, sock-puller or reacher - practice relaxation or meditation daily - do exercises in a comfortable  position that makes breathing as easy as possible - call for medicine refill 2 or 3 days before it runs out - take all medications exactly as prescribed - call doctor with any symptoms you believe are related to your medicine - call doctor when you experience any new symptoms - go to all doctor appointments as scheduled - adhere to prescribed diet: heart healthy/ADA       Plan:Telephone follow up appointment with care management team member scheduled for:  04-09-2021 at 1:45 pm  Noreene Larsson RN, MSN, [place] Mobile: [phone]

## 2021-02-12 NOTE — Patient Instructions (Signed)
Visit Information  Thank you for taking time to visit with me today. Please don't hesitate to contact me if I can be of assistance to you before our next scheduled telephone appointment.  Following are the goals we discussed today:   Patient will self administer medications as prescribed as evidenced by self report/primary caregiver report  Patient will attend all scheduled provider appointments as evidenced by clinician review of documented attendance to scheduled appointments and patient/caregiver report Patient will call pharmacy for medication refills as evidenced by patient report and review of pharmacy fill history as appropriate Patient will attend church or other social activities as evidenced by patient report Patient will continue to perform ADL's independently as evidenced by patient/caregiver report Patient will continue to perform IADL's independently as evidenced by patient/caregiver report Patient will call provider office for new concerns or questions as evidenced by review of documented incoming telephone call notes and patient report Patient will work with BSW to address care coordination needs and will continue to work with the clinical team to address health care and disease management related needs as evidenced by documented adherence to scheduled care management/care coordination appointments - avoid second hand smoke - eliminate smoking in my home - identify and avoid work-related triggers - identify and remove indoor air pollutants - limit outdoor activity during cold weather - listen for public air quality announcements every day - do breathing exercises every day - develop a rescue plan - eliminate symptom triggers at home - follow rescue plan if symptoms flare-up - keep follow-up appointments: with pcp and specialist  - use an extra pillow to sleep - develop a new routine to improve sleep - don't eat or exercise right before bedtime - eat healthy/prescribed  diet: heart healthy/ADA - get at least 7 to 8 hours of sleep at night - use devices that will help like a cane, sock-puller or reacher - practice relaxation or meditation daily - do exercises in a comfortable position that makes breathing as easy as possible - call for medicine refill 2 or 3 days before it runs out - take all medications exactly as prescribed - call doctor with any symptoms you believe are related to your medicine - call doctor when you experience any new symptoms - go to all doctor appointments as scheduled - adhere to prescribed diet: heart healthy/ADA Our next appointment is by telephone on 04-09-2021 at 1:45 pm  Please call the care guide team at [phone] if you need to cancel or reschedule your appointment.   If you are experiencing a Mental Health or [facility] [place] or need someone to talk to, please call the Suicide and Crisis Lifeline: 988 call the [place] National Suicide Prevention Lifeline: 830-584-0149 or TTY: 947-495-8007 TTY 559 653 0643) to talk to a trained counselor call 1-800-273-TALK (toll free, 24 hour hotline)   Patient verbalizes understanding of instructions and care plan provided today and agrees to view in [place]. Active MyChart status confirmed with patient.    Noreene Larsson RN, MSN, [place] Mobile: [phone]

## 2021-02-12 NOTE — Assessment & Plan Note (Signed)
Well controlled DM2 w/ A1c 6.4, at goal No hypoglycemia Complications - CKD-III stable, other including hyperlipidemia, GERD, depression, obesity - increases risk of future cardiovascular complications / poor glucose control due to reduced lifestyle diet/exercise with low energy mood and fatigue  Plan:  1. Remain diet controlled, off meds 2. Encourage improved lifestyle - low carb, low sugar diet, reduce portion size, continue improving regular exercise 3. Check CBG , bring log to next visit for review 4. Continue Statin

## 2021-02-20 DIAGNOSIS — E1169 Type 2 diabetes mellitus with other specified complication: Secondary | ICD-10-CM | POA: Diagnosis not present

## 2021-02-20 DIAGNOSIS — E785 Hyperlipidemia, unspecified: Secondary | ICD-10-CM | POA: Diagnosis not present

## 2021-02-20 DIAGNOSIS — J432 Centrilobular emphysema: Secondary | ICD-10-CM

## 2021-02-20 DIAGNOSIS — R0609 Other forms of dyspnea: Secondary | ICD-10-CM | POA: Diagnosis not present

## 2021-02-20 DIAGNOSIS — G4733 Obstructive sleep apnea (adult) (pediatric): Secondary | ICD-10-CM | POA: Diagnosis not present

## 2021-02-20 DIAGNOSIS — F331 Major depressive disorder, recurrent, moderate: Secondary | ICD-10-CM

## 2021-02-20 DIAGNOSIS — J439 Emphysema, unspecified: Secondary | ICD-10-CM | POA: Diagnosis not present

## 2021-02-22 ENCOUNTER — Other Ambulatory Visit: Payer: Self-pay

## 2021-02-22 DIAGNOSIS — R600 Localized edema: Secondary | ICD-10-CM

## 2021-02-27 ENCOUNTER — Other Ambulatory Visit: Payer: Self-pay

## 2021-02-27 DIAGNOSIS — N3946 Mixed incontinence: Secondary | ICD-10-CM

## 2021-02-27 MED ORDER — MYRBETRIQ 25 MG PO TB24: 25.0000 mg | ORAL_TABLET | Freq: Every day | ORAL | 1 refills | Status: DC

## 2021-02-27 NOTE — Addendum Note (Signed)
Addended by: Olin Hauser on: 02/27/2021 05:25 PM   Modules accepted: Orders

## 2021-03-01 ENCOUNTER — Other Ambulatory Visit: Payer: Self-pay

## 2021-03-01 ENCOUNTER — Emergency Department: Payer: Medicare Other

## 2021-03-01 ENCOUNTER — Encounter: Payer: Self-pay | Admitting: Emergency Medicine

## 2021-03-01 ENCOUNTER — Observation Stay
Admission: EM | Admit: 2021-03-01 | Discharge: 2021-03-02 | Disposition: A | Discharge status: Home / Self Care | Payer: Medicare Other | Attending: Emergency Medicine | Admitting: Emergency Medicine

## 2021-03-01 DIAGNOSIS — J441 Chronic obstructive pulmonary disease with (acute) exacerbation: Principal | ICD-10-CM | POA: Diagnosis present

## 2021-03-01 DIAGNOSIS — R0602 Shortness of breath: Secondary | ICD-10-CM | POA: Diagnosis not present

## 2021-03-01 DIAGNOSIS — Z96653 Presence of artificial knee joint, bilateral: Secondary | ICD-10-CM | POA: Diagnosis not present

## 2021-03-01 DIAGNOSIS — Z7951 Long term (current) use of inhaled steroids: Secondary | ICD-10-CM | POA: Insufficient documentation

## 2021-03-01 DIAGNOSIS — Z20822 Contact with and (suspected) exposure to covid-19: Secondary | ICD-10-CM | POA: Diagnosis not present

## 2021-03-01 DIAGNOSIS — K219 Gastro-esophageal reflux disease without esophagitis: Secondary | ICD-10-CM | POA: Insufficient documentation

## 2021-03-01 DIAGNOSIS — N183 Chronic kidney disease, stage 3 unspecified: Secondary | ICD-10-CM | POA: Insufficient documentation

## 2021-03-01 DIAGNOSIS — Z87891 Personal history of nicotine dependence: Secondary | ICD-10-CM | POA: Insufficient documentation

## 2021-03-01 DIAGNOSIS — J45909 Unspecified asthma, uncomplicated: Secondary | ICD-10-CM | POA: Diagnosis not present

## 2021-03-01 LAB — TROPONIN I (HIGH SENSITIVITY): Troponin I (High Sensitivity): 4 ng/L (ref <18)

## 2021-03-01 LAB — CBC
HCT: 39.4 % (ref 36.0–46.0)
Hemoglobin: 12.7 g/dL (ref 12.0–15.0)
MCH: 32.5 pg (ref 26.0–34.0)
MCHC: 32.2 g/dL (ref 30.0–36.0)
MCV: 100.8 fL — ABNORMAL HIGH (ref 80.0–100.0)
Platelets: 212 10*3/uL (ref 150–400)
RBC: 3.91 MIL/uL (ref 3.87–5.11)
RDW: 12.9 % (ref 11.5–15.5)
WBC: 7.4 10*3/uL (ref 4.0–10.5)
nRBC: 0 % (ref 0.0–0.2)

## 2021-03-01 LAB — BASIC METABOLIC PANEL
Anion gap: 10 (ref 5–15)
BUN: 12 mg/dL (ref 8–23)
CO2: 23 mmol/L (ref 22–32)
Calcium: 8.7 mg/dL — ABNORMAL LOW (ref 8.9–10.3)
Chloride: 106 mmol/L (ref 98–111)
Creatinine, Ser: 1.08 mg/dL — ABNORMAL HIGH (ref 0.44–1.00)
GFR, Estimated: 51 mL/min — ABNORMAL LOW (ref >60)
Glucose, Bld: 125 mg/dL — ABNORMAL HIGH (ref 70–99)
Potassium: 4.6 mmol/L (ref 3.5–5.1)
Sodium: 139 mmol/L (ref 135–145)

## 2021-03-01 LAB — RESP PANEL BY RT-PCR (FLU A&B, COVID) ARPGX2
Influenza A by PCR: NEGATIVE
Influenza B by PCR: NEGATIVE
SARS Coronavirus 2 by RT PCR: NEGATIVE

## 2021-03-01 MED ORDER — TRAZODONE HCL 100 MG PO TABS
100.0000 mg | ORAL_TABLET | Freq: Every day | ORAL | Status: DC
  Administered 2021-03-01: 100 mg via ORAL
  Filled 2021-03-01: qty 1

## 2021-03-01 MED ORDER — FLUTICASONE FUROATE-VILANTEROL 100-25 MCG/ACT IN AEPB
1.0000 | INHALATION_SPRAY | Freq: Every day | RESPIRATORY_TRACT | Status: DC
  Administered 2021-03-02: 1 via RESPIRATORY_TRACT
  Filled 2021-03-01: qty 28

## 2021-03-01 MED ORDER — IPRATROPIUM-ALBUTEROL 0.5-2.5 (3) MG/3ML IN SOLN
3.0000 mL | Freq: Once | RESPIRATORY_TRACT | Status: AC
  Administered 2021-03-01: 3 mL via RESPIRATORY_TRACT
  Filled 2021-03-01: qty 3

## 2021-03-01 MED ORDER — MIRABEGRON ER 25 MG PO TB24
25.0000 mg | ORAL_TABLET | Freq: Every day | ORAL | Status: DC
  Administered 2021-03-02: 25 mg via ORAL
  Filled 2021-03-01: qty 1

## 2021-03-01 MED ORDER — SIMVASTATIN 20 MG PO TABS
40.0000 mg | ORAL_TABLET | Freq: Every evening | ORAL | Status: DC
  Administered 2021-03-01: 40 mg via ORAL
  Filled 2021-03-01: qty 2

## 2021-03-01 MED ORDER — METHYLPREDNISOLONE SODIUM SUCC 125 MG IJ SOLR
125.0000 mg | Freq: Once | INTRAMUSCULAR | Status: AC
  Administered 2021-03-01: 125 mg via INTRAMUSCULAR
  Filled 2021-03-01: qty 2

## 2021-03-01 MED ORDER — POLYETHYLENE GLYCOL 3350 17 G PO PACK: 17.0000 g | PACK | Freq: Every day | ORAL | Status: DC | PRN

## 2021-03-01 MED ORDER — METHYLPREDNISOLONE SODIUM SUCC 40 MG IJ SOLR
40.0000 mg | Freq: Two times a day (BID) | INTRAMUSCULAR | Status: AC
  Administered 2021-03-01: 40 mg via INTRAVENOUS
  Filled 2021-03-01: qty 1

## 2021-03-01 MED ORDER — PREDNISONE 20 MG PO TABS
40.0000 mg | ORAL_TABLET | Freq: Every day | ORAL | Status: DC
  Administered 2021-03-02: 40 mg via ORAL
  Filled 2021-03-01: qty 2

## 2021-03-01 MED ORDER — ENOXAPARIN SODIUM 40 MG/0.4ML IJ SOSY
40.0000 mg | PREFILLED_SYRINGE | INTRAMUSCULAR | Status: DC
  Administered 2021-03-01: 40 mg via SUBCUTANEOUS
  Filled 2021-03-01: qty 0.4

## 2021-03-01 MED ORDER — BISACODYL 10 MG RE SUPP
10.0000 mg | Freq: Every day | RECTAL | Status: DC | PRN
  Filled 2021-03-01: qty 1

## 2021-03-01 MED ORDER — FAMOTIDINE 20 MG PO TABS
20.0000 mg | ORAL_TABLET | Freq: Two times a day (BID) | ORAL | Status: DC
  Administered 2021-03-01 – 2021-03-02 (×2): 20 mg via ORAL
  Filled 2021-03-01 (×2): qty 1

## 2021-03-01 MED ORDER — METHYLPREDNISOLONE SODIUM SUCC 125 MG IJ SOLR: 125.0000 mg | Freq: Once | INTRAMUSCULAR | Status: DC

## 2021-03-01 MED ORDER — ALBUTEROL SULFATE (2.5 MG/3ML) 0.083% IN NEBU
2.5000 mg | INHALATION_SOLUTION | RESPIRATORY_TRACT | Status: DC | PRN
  Administered 2021-03-02: 2.5 mg via RESPIRATORY_TRACT
  Filled 2021-03-01: qty 3

## 2021-03-01 MED ORDER — METOPROLOL SUCCINATE ER 25 MG PO TB24
25.0000 mg | ORAL_TABLET | Freq: Every day | ORAL | Status: DC
  Administered 2021-03-02: 25 mg via ORAL
  Filled 2021-03-01: qty 1

## 2021-03-01 MED ORDER — ALBUTEROL SULFATE (2.5 MG/3ML) 0.083% IN NEBU
2.5000 mg | INHALATION_SOLUTION | Freq: Four times a day (QID) | RESPIRATORY_TRACT | Status: DC
  Administered 2021-03-01 – 2021-03-02 (×3): 2.5 mg via RESPIRATORY_TRACT
  Filled 2021-03-01 (×3): qty 3

## 2021-03-01 MED ORDER — ONDANSETRON HCL 4 MG PO TABS: 4.0000 mg | ORAL_TABLET | Freq: Four times a day (QID) | ORAL | Status: DC | PRN

## 2021-03-01 MED ORDER — CITALOPRAM HYDROBROMIDE 10 MG PO TABS
40.0000 mg | ORAL_TABLET | Freq: Every day | ORAL | Status: DC
  Administered 2021-03-02: 40 mg via ORAL
  Filled 2021-03-01: qty 4

## 2021-03-01 MED ORDER — IPRATROPIUM-ALBUTEROL 0.5-2.5 (3) MG/3ML IN SOLN
3.0000 mL | Freq: Once | RESPIRATORY_TRACT | Status: AC
  Administered 2021-03-01: 3 mL via RESPIRATORY_TRACT
  Filled 2021-03-01: qty 6

## 2021-03-01 MED ORDER — IPRATROPIUM-ALBUTEROL 0.5-2.5 (3) MG/3ML IN SOLN
3.0000 mL | Freq: Once | RESPIRATORY_TRACT | Status: AC
  Administered 2021-03-01: 3 mL via RESPIRATORY_TRACT

## 2021-03-01 MED ORDER — ONDANSETRON HCL 4 MG/2ML IJ SOLN: 4.0000 mg | Freq: Four times a day (QID) | INTRAMUSCULAR | Status: DC | PRN

## 2021-03-01 MED ORDER — UMECLIDINIUM BROMIDE 62.5 MCG/ACT IN AEPB
1.0000 | INHALATION_SPRAY | Freq: Every day | RESPIRATORY_TRACT | Status: DC
  Administered 2021-03-02: 1 via RESPIRATORY_TRACT
  Filled 2021-03-01: qty 7

## 2021-03-01 NOTE — ED Notes (Signed)
EDP on way to see pt. Pt brought to room from xray. Using accessory muscles to breathe. Pt very SOB, significant wheezing heard without steth.

## 2021-03-01 NOTE — H&P (Signed)
History and Physical    Patient: Jenna [name] HEN:[id number] DOB: [birth year] DOA: 03/01/2021 DOS: the patient was seen and examined on 03/01/2021 PCP: Olin Hauser, DO  Patient coming from: Home  Chief Complaint:  Chief Complaint  Patient presents with   COPD    HPI: Jenna [name] is a 86 y.o. female with medical history significant of COPD, [name], arthritis, palpitation and hyperlipidemia comes emergency room with increasing shortness of breath and wheezing patient has known history of COPD. She used her nebulizer and inhalers. Continue to feel short of breath. Came to the emergency room initially was using accessory muscles per RN. Sats remains more than 92% on room air. See the dose of IV Solu-Medrol. Chest x-ray no pneumonia. Respiratory panel pending. Patient received several rounds of breathing treatment. Overall improved however still has expiratory wheezing. Patient is being admitted for overnight observation.  Review of Systems: As mentioned in the history of present illness. All other systems reviewed and are negative. Past Medical History:  Diagnosis Date   Anxiety    Arthritis    neck, knees(before replacements)   Asthma    CKD (chronic kidney disease), stage III (HCC)    COPD (chronic obstructive pulmonary disease) ([place])    Encounter for colonoscopy due to history of adenomatous colonic polyps    GERD (gastroesophageal reflux disease)    Hearing loss of both ears    History of echocardiogram    a. 10/2013 Echo: EF 60-65%, no rwma, midlly dil LA. Rnl RV fxn.   Hyperlipidemia    Osteoporosis    Palpitations    PSVT (paroxysmal supraventricular tachycardia) ([place])    a. 05/2018 Z[facility]: 99 SVT runs. Fastest 218 x 4:30. Longest 4:38 w/ rate of 172.   Sleep apnea    resolved with gastric bypass   Syncope and collapse    Urine incontinence    Past Surgical History:  Procedure Laterality Date   BROW LIFT Bilateral 06/27/2015   Procedure: BLEPHAROPLASTY  BILATERAL UPPER EYELIDS BILATERAL BLEPHAROTOSIS EYELIDS;  Surgeon: Karle Starch, MD;  Location: [facility];  Service: Ophthalmology;  Laterality: Bilateral;  BILATERAL   CATARACT EXTRACTION W/PHACO Right 02/13/2015   Procedure: CATARACT EXTRACTION PHACO AND INTRAOCULAR LENS PLACEMENT (IOC);  Surgeon: Ronnell Freshwater, MD;  Location: [facility];  Service: Ophthalmology;  Laterality: Right;   CATARACT EXTRACTION W/PHACO Left 03/22/2015   Procedure: CATARACT EXTRACTION PHACO AND INTRAOCULAR LENS PLACEMENT (IOC);  Surgeon: Ronnell Freshwater, MD;  Location: [facility];  Service: Ophthalmology;  Laterality: Left;  TORIC   CHOLECYSTECTOMY  1984   COLECTOMY     COSMETIC SURGERY  2012   tummy tuck and excess skin removal   GASTRIC BYPASS  2010   REPLACEMENT TOTAL KNEE BILATERAL  1998   SHOULDER SURGERY  2009   left    TONSILLECTOMY     TOTAL ABDOMINAL HYSTERECTOMY  1979   Social History:  reports that she quit smoking about 39 years ago. Her smoking use included cigarettes. She has a 20.00 pack-year smoking history. She has quit using smokeless tobacco. She reports that she does not currently use alcohol. She reports that she does not use drugs.  Allergies  Allergen Reactions   Azithromycin Other (See Comments)    Causes stomach burning pains   Hydromorphone Other (See Comments)    confusion, personality change   Morphine Other (See Comments)    Goes crazy   Nsaids Other (See Comments)    Avoids because  of gastric bypass surgery   Oxycodone-Acetaminophen Nausea And Vomiting   Codeine Other (See Comments) and Nausea And Vomiting    "will not stay down"   Nabumetone Rash   Oxycodone Nausea And Vomiting   Oxycodone-Acetaminophen Nausea And Vomiting   Promethazine Hcl Rash and Other (See Comments)    Family History  Problem Relation Age of Onset   Heart attack Mother    Hypertension Mother    Diabetes type II Mother    Pneumonia Father    Skin  cancer Father    Diabetes type II Father    Colon cancer Sister    Ovarian cancer Sister    Heart attack Brother    Heart attack Sister 60   Hyperlipidemia Sister    Hypertension Sister     Prior to Admission medications   Medication Sig Start Date End Date Taking? Authorizing Provider  albuterol (PROVENTIL) (2.5 MG/3ML) 0.083% nebulizer solution Take 3 mLs (2.5 mg total) by nebulization every 4 (four) hours as needed for wheezing or shortness of breath. 08/09/20 08/09/21  Karamalegos, Devonne Doughty, DO  albuterol (VENTOLIN HFA) 108 (90 Base) MCG/ACT inhaler Inhale 2 puffs into the lungs every 6 (six) hours as needed for wheezing or shortness of breath. 02/12/21 05/13/21  Karamalegos, Devonne Doughty, DO  alendronate (FOSAMAX) 70 MG tablet Take 1 tablet (70 mg total) by mouth once a week. 12/08/20   Karamalegos, Devonne Doughty, DO  Ascorbic Acid (VITAMIN C) 1000 MG tablet Take 500 mg by mouth daily.     [provider]  Cholecalciferol 25 MCG (1000 UT) tablet Take 1,000 Units by mouth daily.     [provider]  citalopram (CELEXA) 40 MG tablet Take 1 tablet (40 mg total) by mouth daily. 10/24/20   Karamalegos, Devonne Doughty, DO  clotrimazole-betamethasone (LOTRISONE) cream Apply 1-2 times a day for worsening flare dry skin dermatitis of toes/feet, may re-use daily up to 1 week as needed. 08/06/19   Karamalegos, Devonne Doughty, DO  Cyanocobalamin 1000 MCG TBCR Take 1,000 mcg by mouth daily.     [provider]  diclofenac sodium (VOLTAREN) 1 % GEL Apply 2 g topically 3 (three) times daily as needed. For hands, arthritis 11/12/17   Olin Hauser, DO  famotidine (PEPCID) 20 MG tablet Take 20 mg by mouth 2 (two) times daily.    [provider]  ferrous sulfate 325 (65 FE) MG EC tablet Take 325 mg by mouth at bedtime.     [provider]  furosemide (LASIX) 20 MG tablet Take 1 tablet (20 mg total) by mouth daily as needed for fluid or edema. Take lasix daily,  with extra dose as needed for swelling 05/30/20   Minna Merritts, MD  ipratropium (ATROVENT) 0.02 % nebulizer solution Take 2.5 mLs (0.5 mg total) by nebulization every 6 (six) hours as needed for wheezing or shortness of breath. 04/28/20 07/27/20  [place] Donnamarie Poag, DO  methocarbamol (ROBAXIN) 500 MG tablet Take 1 tablet (500 mg total) by mouth every 8 (eight) hours as needed for muscle spasms. 06/03/19   Karamalegos, Devonne Doughty, DO  metoprolol succinate (TOPROL-XL) 25 MG 24 hr tablet Take 1 tablet (25 mg total) by mouth daily. Take an additional 12.5 mg - 25 mg as needed for palpitations 05/30/20   Minna Merritts, MD  Multiple Vitamins-Minerals (ONE-A-DAY WOMENS PETITES) TABS Take 2 tablets by mouth every morning.    [provider]  MYRBETRIQ 25 MG TB24 tablet Take 1 tablet (25  mg total) by mouth daily. 02/27/21   Karamalegos, Devonne Doughty, DO  Omega-3 Fatty Acids (FISH OIL) 1000 MG CAPS Take 1,000 mg by mouth daily.    [provider]  simvastatin (ZOCOR) 40 MG tablet Take 1 tablet (40 mg total) by mouth daily. 10/24/20   Karamalegos, Devonne Doughty, DO  terbinafine (LAMISIL) 250 MG tablet Take 1 tablet (250 mg total) by mouth daily. For 1-3 months or until resolved. 12/21/20   Karamalegos, Devonne Doughty, DO  traZODone (DESYREL) 100 MG tablet Take 1 tablet (100 mg total) by mouth at bedtime. 02/12/21   Karamalegos, Alexander J, DO  TRELEGY ELLIPTA 100-62.5-25 MCG/INH AEPB Inhale 1 puff into the lungs daily. 07/31/20   [provider]    Physical Exam: Vitals:   03/01/21 1256 03/01/21 1303 03/01/21 1330 03/01/21 1430  BP:  130/72 (!) 123/49 (!) 125/50  Pulse:  69 61 68  Resp:  (!) 24 (!) 30 19  Temp:  98 F (36.7 C)    TempSrc:  Oral    SpO2:  99% 100% 100%  Weight: 98 kg     Height: 5\' 3"  (1.6 m)     patient alert and oriented times three. Respiratory has expiratory wheezing. No use of accessory muscle. Both heart sounds normal no murmur heard abdomen soft nontender  benign patient is alert and oriented times three    Assessment and Plan:  Jenna [name] is a 86 y.o. female with medical history significant of COPD, Gerd, arthritis, palpitation and hyperlipidemia comes emergency room with increasing shortness of breath and wheezing.  COPD exacerbation acute on chronic -- came in with expiratory wheezing and use of accessory muscles. Received couple rounds of nebulizer and IV Solu-Medrol. Feels bit better. -- Dry cough -- chest x-ray negative for pneumonia -- continue IV Solu-Medrol BID, bronchodilator and scheduled nebulizer treatment -- sats 99% on room air -- patient overall feels she is improving -- COVID negative, flu negative  [name] continue Pepcid  Hyperlipidemia continue statins   Advance Care Planning:   Code Status: Full Code   Consults: none  Family Communication: husband at bedside in the ER  Severity of Illness: The appropriate patient status for this patient is OBSERVATION. Observation status is judged to be reasonable and necessary in order to provide the required intensity of service to ensure the patient's safety. The patient's presenting symptoms, physical exam findings, and initial radiographic and laboratory data in the context of their medical condition is felt to place them at decreased risk for further clinical deterioration. Furthermore, it is anticipated that the patient will be medically stable for discharge from the hospital within 2 midnights of admission.   Author: Fritzi Mandes, MD 03/01/2021 4:56 PM  For on call review [email or web address].

## 2021-03-01 NOTE — ED Triage Notes (Signed)
Pt comes into the ED via POV c/o COPD exacerbation.  Pt states she is tight in the chest and has SHOB.  Pt explains the symptoms started a couple days ago and she has been unsuccessful in getting relief with her home nebs and inhalers.  Pt presents with little air movement at this time.

## 2021-03-01 NOTE — ED Notes (Signed)
EDP at bedside now.

## 2021-03-01 NOTE — ED Provider Notes (Signed)
[facility] Provider Note    Event Date/Time   First MD Initiated Contact with Patient 03/01/21 1317     (approximate)   History   Chief Complaint COPD   HPI Jenna [name] is a 86 y.o. female, history of COPD, type 2 diabetes, hyperlipidemia, PSVT, depression, obesity, presents to the emergency department for evaluation of shortness of breath.  Patient states that her symptoms began couple days ago, reporting tightness sensation in her chest as well as progressive shortness of breath.  Denies chest pain, abdominal pain, nausea/vomiting, headache, rashes, urinary symptoms, or dizziness.  History Limitations: No limitations.      Physical Exam  Triage Vital Signs: ED Triage Vitals  Enc Vitals Group     BP 03/01/21 1303 130/72     Pulse Rate 03/01/21 1303 69     Resp 03/01/21 1303 (!) 24     Temp 03/01/21 1303 98 F (36.7 C)     Temp Source 03/01/21 1303 Oral     SpO2 03/01/21 1303 99 %     Weight 03/01/21 1256 216 lb (98 kg)     Height 03/01/21 1256 5\' 3"  (1.6 m)     Head Circumference --      Peak Flow --      Pain Score 03/01/21 1304 0     Pain Loc --      Pain Edu? --      Excl. in [place]? --     Most recent vital signs: Vitals:   03/01/21 1735 03/01/21 2022  BP: (!) 129/53   Pulse: 82   Resp: 18   Temp: 97.9 F (36.6 C)   SpO2: 100% 95%    General: Awake, NAD.  CV: Good peripheral perfusion.  Resp: Normal effort.  Expiratory wheeze in the apices and bases Abd: Soft, non-tender. No distention.  Neuro: At baseline. No gross neurological deficits. Other:   Physical Exam    ED Results / Procedures / Treatments  Labs (all labs ordered are listed, but only abnormal results are displayed) Labs Reviewed  BASIC METABOLIC PANEL - Abnormal; Notable for the following components:      Result Value   Glucose, Bld 125 (*)    Creatinine, Ser 1.08 (*)    Calcium 8.7 (*)    GFR, Estimated 51 (*)    All other components within normal  limits  CBC - Abnormal; Notable for the following components:   MCV 100.8 (*)    All other components within normal limits  RESP PANEL BY RT-PCR (FLU A&B, COVID) ARPGX2  TROPONIN I (HIGH SENSITIVITY)     EKG Sinus rhythm, rate of 68, no ST segment changes, no axis deviations, no AV blocks.   RADIOLOGY  ED Provider Interpretation: I personally reviewed and interpreted this image.  No active cardiopulmonary disease  DG Chest 2 View  Result Date: 03/01/2021 CLINICAL DATA:  Shortness of breath.  COPD exacerbation. EXAM: CHEST - 2 VIEW COMPARISON:  AP chest 04/22/2020 FINDINGS: Cardiac silhouette and mediastinal contours are within normal limits. The lungs are clear. No pleural effusion or pneumothorax. Mild dextrocurvature of the mid to lower thoracic spine with moderate multilevel degenerative disc changes. Diffuse decreased bone mineralization. Moderate to severe right and moderate left acromioclavicular osteoarthritis. Cholecystectomy clips. IMPRESSION: No active cardiopulmonary disease. Electronically Signed   By: Yvonne Kendall M.D.   On: 03/01/2021 13:26    PROCEDURES:  Critical Care performed: None.  Procedures    MEDICATIONS ORDERED IN ED:  Medications  methylPREDNISolone sodium succinate (SOLU-MEDROL) 40 mg/mL injection 40 mg ( Intravenous Canceled Entry 03/01/21 1808)    Followed by  predniSONE (DELTASONE) tablet 40 mg (has no administration in time range)  albuterol (PROVENTIL) (2.5 MG/3ML) 0.083% nebulizer solution 2.5 mg (has no administration in time range)  enoxaparin (LOVENOX) injection 40 mg (40 mg Subcutaneous Given 03/01/21 1821)  polyethylene glycol (MIRALAX / GLYCOLAX) packet 17 g (has no administration in time range)  bisacodyl (DULCOLAX) suppository 10 mg (has no administration in time range)  ondansetron (ZOFRAN) tablet 4 mg (has no administration in time range)    Or  ondansetron (ZOFRAN) injection 4 mg (has no administration in time range)  metoprolol succinate  (TOPROL-XL) 24 hr tablet 25 mg (has no administration in time range)  simvastatin (ZOCOR) tablet 40 mg (40 mg Oral Given 03/01/21 1951)  citalopram (CELEXA) tablet 40 mg (has no administration in time range)  traZODone (DESYREL) tablet 100 mg (has no administration in time range)  famotidine (PEPCID) tablet 20 mg (has no administration in time range)  mirabegron ER (MYRBETRIQ) tablet 25 mg (has no administration in time range)  fluticasone furoate-vilanterol (BREO ELLIPTA) 100-25 MCG/ACT 1 puff (has no administration in time range)    And  umeclidinium bromide (INCRUSE ELLIPTA) 62.5 MCG/ACT 1 puff (has no administration in time range)  albuterol (PROVENTIL) (2.5 MG/3ML) 0.083% nebulizer solution 2.5 mg (2.5 mg Nebulization Given 03/01/21 2019)  ipratropium-albuterol (DUONEB) 0.5-2.5 (3) MG/3ML nebulizer solution 3 mL (3 mLs Nebulization Given 03/01/21 1338)  methylPREDNISolone sodium succinate (SOLU-MEDROL) 125 mg/2 mL injection 125 mg (125 mg Intramuscular Given 03/01/21 1341)  ipratropium-albuterol (DUONEB) 0.5-2.5 (3) MG/3ML nebulizer solution 3 mL (3 mLs Nebulization Given 03/01/21 1415)  ipratropium-albuterol (DUONEB) 0.5-2.5 (3) MG/3ML nebulizer solution 3 mL (3 mLs Nebulization Given 03/01/21 1548)     IMPRESSION / MDM / ASSESSMENT AND PLAN / ED COURSE  I reviewed the triage vital signs and the nursing notes.                              Jenna [name] is a 86 y.o. female, history of COPD, type 2 diabetes, hyperlipidemia, PSVT, depression, obesity, presents to the emergency department for evaluation of shortness of breath.  Patient states that her symptoms began couple days ago, reporting tightness sensation in her chest as well as progressive shortness of breath.  Differential diagnosis includes, but is not limited to, COPD exacerbation, influenza, COVID-19, pneumonia, bronchitis.  ED Course Patient appears short of breath.  Vital signs within normal limits at this time.  We will go ahead  and initiate Solu-Medrol and DuoNeb treatments.  CBC shows no leukocytosis or anemia.  BMP unremarkable for significant electrolyte abnormalities or kidney injury.  Initial troponin 4.  Initial EKG unremarkable.  Unlikely ACS or myocarditis/pericarditis.   Chest x-ray negative for acute cardiopulmonary disease  Assessment/Plan History, physical exam, and work-up thus far consistent with COPD exacerbation.  Low suspicion for pulmonary embolism or pneumonia given her presentation. Upon reexamination, patient appears to have improved slightly with the DuoNebs and steroids, but continues to have significant expiratory wheezing on exam.  Concern for decompensation if discharged home.  We will plan to admit this patient for observation.  Hospitalist accepted admission          FINAL CLINICAL IMPRESSION(S) / ED DIAGNOSES   Final diagnoses:  None     Rx / DC Orders   ED Discharge Orders  None        Note:  This document was prepared using Dragon voice recognition software and may include unintentional dictation errors.   Teodoro Spray, [place] 03/01/21 2025    Blake Divine, MD 03/02/21 [phone]

## 2021-03-01 NOTE — ED Notes (Signed)
Urine sent to lab 

## 2021-03-02 DIAGNOSIS — J441 Chronic obstructive pulmonary disease with (acute) exacerbation: Secondary | ICD-10-CM | POA: Diagnosis not present

## 2021-03-02 MED ORDER — DOXYCYCLINE HYCLATE 100 MG PO TABS
100.0000 mg | ORAL_TABLET | Freq: Two times a day (BID) | ORAL | Status: DC
  Administered 2021-03-02: 100 mg via ORAL
  Filled 2021-03-02: qty 1

## 2021-03-02 MED ORDER — DOXYCYCLINE HYCLATE 100 MG PO TABS: 100.0000 mg | ORAL_TABLET | Freq: Two times a day (BID) | ORAL | 0 refills | Status: DC

## 2021-03-02 MED ORDER — PREDNISONE 20 MG PO TABS: 40.0000 mg | ORAL_TABLET | Freq: Every day | ORAL | 0 refills | Status: DC

## 2021-03-02 NOTE — Plan of Care (Signed)
°  Problem: Education: Goal: Knowledge of disease or condition will improve Outcome: Progressing Goal: Knowledge of the prescribed therapeutic regimen will improve Outcome: Progressing Goal: Individualized Educational Video(s) Outcome: Progressing   Problem: Activity: Goal: Ability to tolerate increased activity will improve Outcome: Progressing Goal: Will verbalize the importance of balancing activity with adequate rest periods Outcome: Progressing   Problem: Respiratory: Goal: Ability to maintain a clear airway will improve Outcome: Progressing Goal: Levels of oxygenation will improve Outcome: Progressing Goal: Ability to maintain adequate ventilation will improve Outcome: Progressing   Problem: Education: Goal: Knowledge of General Education information will improve Description: Including pain rating scale, medication(s)/side effects and non-pharmacologic comfort measures Outcome: Progressing   Problem: Health Behavior/Discharge Planning: Goal: Ability to manage health-related needs will improve Outcome: Progressing   Problem: Clinical Measurements: Goal: Ability to maintain clinical measurements within normal limits will improve Outcome: Progressing Goal: Will remain free from infection Outcome: Progressing Goal: Diagnostic test results will improve Outcome: Progressing Goal: Respiratory complications will improve Outcome: Progressing Goal: Cardiovascular complication will be avoided Outcome: Progressing   Problem: Activity: Goal: Risk for activity intolerance will decrease Outcome: Progressing   Problem: Nutrition: Goal: Adequate nutrition will be maintained Outcome: Progressing   Problem: Coping: Goal: Level of anxiety will decrease Outcome: Progressing   Problem: Elimination: Goal: Will not experience complications related to bowel motility Outcome: Progressing Goal: Will not experience complications related to urinary retention Outcome: Progressing    Problem: Pain Managment: Goal: General experience of comfort will improve Outcome: Progressing   Problem: Safety: Goal: Ability to remain free from injury will improve Outcome: Progressing   Problem: Skin Integrity: Goal: Risk for impaired skin integrity will decrease Outcome: Progressing   Problem: Education: Goal: Knowledge of disease or condition will improve Outcome: Progressing Goal: Knowledge of the prescribed therapeutic regimen will improve Outcome: Progressing Goal: Individualized Educational Video(s) Outcome: Progressing   Problem: Activity: Goal: Ability to tolerate increased activity will improve Outcome: Progressing Goal: Will verbalize the importance of balancing activity with adequate rest periods Outcome: Progressing   Problem: Respiratory: Goal: Ability to maintain a clear airway will improve Outcome: Progressing Goal: Levels of oxygenation will improve Outcome: Progressing Goal: Ability to maintain adequate ventilation will improve Outcome: Progressing

## 2021-03-02 NOTE — TOC Progression Note (Signed)
Transition of Care [facility]) - Progression Note    Patient Details  Name: Jenna [name] MRN: [id number] Date of Birth: 05-11-1935  Transition of Care [facility]) CM/SW [place], RN Phone Number: 03/02/2021, 10:02 AM  Clinical Narrative:      Transition of Care (TOC) Screening Note   Patient Details  Name: Jenna [name] Date of Birth: 05-06-1935   Transition of Care [facility]) CM/SW Contact:    Conception Oms, RN Phone Number: 03/02/2021, 10:02 AM  From Home has a nebulizer machine at home, on Room Air  Transition of Care Department [facility]) has reviewed patient and no TOC needs have been identified at this time. We will continue to monitor patient advancement through interdisciplinary progression rounds. If new patient transition needs arise, please place a TOC consult.         Expected Discharge Plan and Services           Expected Discharge Date: 03/02/21                                     Social Determinants of Health (SDOH) Interventions    Readmission Risk Interventions No flowsheet data found.

## 2021-03-02 NOTE — Progress Notes (Signed)
DISCHARGE NOTE:  Pt given discharge instructions, pt verbalized understanding. Pt wheeled by volunteer, husband transporting pt home.

## 2021-03-02 NOTE — Discharge Summary (Signed)
Physician Discharge Summary   Patient: Jenna [name] MRN: [id number] DOB: [birth year]  Admit date:     03/01/2021  Discharge date: 03/02/21  Discharge Physician: Fritzi Mandes   PCP: Olin Hauser, DO   Recommendations at discharge:   use your inhalers and nebulizer as before follow-up with Dr. Raul Del in a week to  10 days. Patient to call and make appointment  Discharge Diagnoses: acute on chronic COPD exacerbation acute bronchitis  Hospital Course: Jenna [name] is a 86 y.o. female with medical history significant of COPD, Gerd, arthritis, palpitation and hyperlipidemia comes emergency room with increasing shortness of breath and wheezing.   COPD exacerbation acute on chronic -- came in with expiratory wheezing and use of accessory muscles. Received couple rounds of nebulizer and IV Solu-Medrol. Feels bit better. -- Dry cough with occ yellow phlegm -- chest x-ray negative for pneumonia --  IV Solu-Medrol BID, bronchodilator and scheduled nebulizer treatment-- change to PO steroid -- sats 99% on room air -- patient overall feels she is improving.. Sats remain more than 94% on room air. -- COVID negative, flu negative -- patient has some exertion or shortness of breath. No respiratory distress. -- Will give empiric doxycycline for seven days   Jenna [name] continue Pepcid   Hyperlipidemia continue statins   overall improving slowly. Should follow-up with Dr. Raul Del as outpatient. Patient voice understanding.         Consultants: none Procedures performed: none Disposition: Home Diet recommendation:  Discharge Diet Orders (From admission, onward)     Start     Ordered   03/02/21 0000  Diet - low sodium heart healthy        03/02/21 0945           Regular diet  DISCHARGE MEDICATION: Allergies as of 03/02/2021       Reactions   Azithromycin Other (See Comments)   Causes stomach burning pains   Hydromorphone Other (See Comments)   confusion,  personality change   Morphine Other (See Comments)   Goes crazy   Nsaids Other (See Comments)   Avoids because of gastric bypass surgery   Oxycodone-acetaminophen Nausea And Vomiting   Codeine Other (See Comments), Nausea And Vomiting   "will not stay down"   Nabumetone Rash   Oxycodone Nausea And Vomiting   Oxycodone-acetaminophen Nausea And Vomiting   Promethazine Hcl Rash, Other (See Comments)        Medication List     STOP taking these medications    clotrimazole-betamethasone cream Commonly known as: LOTRISONE   diclofenac sodium 1 % Gel Commonly known as: VOLTAREN   ipratropium 0.02 % nebulizer solution Commonly known as: ATROVENT       TAKE these medications    albuterol (2.5 MG/3ML) 0.083% nebulizer solution Commonly known as: PROVENTIL Take 3 mLs (2.5 mg total) by nebulization every 4 (four) hours as needed for wheezing or shortness of breath.   albuterol 108 (90 Base) MCG/ACT inhaler Commonly known as: VENTOLIN HFA Inhale 2 puffs into the lungs every 6 (six) hours as needed for wheezing or shortness of breath.   alendronate 70 MG tablet Commonly known as: FOSAMAX Take 1 tablet (70 mg total) by mouth once a week.   Cholecalciferol 25 MCG (1000 UT) tablet Take 1,000 Units by mouth daily.   citalopram 40 MG tablet Commonly known as: CELEXA Take 1 tablet (40 mg total) by mouth daily.   Cyanocobalamin 1000 MCG Tbcr Take 1,000 mcg by mouth daily.   doxycycline  100 MG tablet Commonly known as: VIBRA-TABS Take 1 tablet (100 mg total) by mouth every 12 (twelve) hours for 7 days.   famotidine 20 MG tablet Commonly known as: PEPCID Take 20 mg by mouth 2 (two) times daily.   ferrous sulfate 325 (65 FE) MG EC tablet Take 325 mg by mouth at bedtime.   Fish Oil 1000 MG Caps Take 1,000 mg by mouth daily.   furosemide 20 MG tablet Commonly known as: LASIX Take 1 tablet (20 mg total) by mouth daily as needed for fluid or edema. Take lasix daily, with  extra dose as needed for swelling   methocarbamol 500 MG tablet Commonly known as: Robaxin Take 1 tablet (500 mg total) by mouth every 8 (eight) hours as needed for muscle spasms.   metoprolol succinate 25 MG 24 hr tablet Commonly known as: TOPROL-XL Take 1 tablet (25 mg total) by mouth daily. Take an additional 12.5 mg - 25 mg as needed for palpitations   Myrbetriq 25 MG Tb24 tablet Generic drug: mirabegron ER Take 1 tablet (25 mg total) by mouth daily.   One-A-Day Womens Petites Tabs Take 2 tablets by mouth every morning.   predniSONE 20 MG tablet Commonly known as: DELTASONE Take 2 tablets (40 mg total) by mouth daily with breakfast for 4 days. Start taking on: March 03, 2021   simvastatin 40 MG tablet Commonly known as: ZOCOR Take 1 tablet (40 mg total) by mouth daily.   terbinafine 250 MG tablet Commonly known as: LAMISIL Take 1 tablet (250 mg total) by mouth daily. For 1-3 months or until resolved.   traZODone 100 MG tablet Commonly known as: DESYREL Take 1 tablet (100 mg total) by mouth at bedtime.   Trelegy Ellipta 100-62.5-25 MCG/ACT Aepb Generic drug: Fluticasone-Umeclidin-Vilant Inhale 1 puff into the lungs daily.   vitamin C 1000 MG tablet Take 500 mg by mouth daily.        Follow-up Information     Olin Hauser, DO. Schedule an appointment as soon as possible for a visit in 1 week(s).   Specialty: Family Medicine Why: copd flare Contact information: [place] [address] [phone]         Minna Merritts, MD .   Specialty: Cardiology Contact information: [place] [address] [phone]         Erby Pian, MD. Schedule an appointment as soon as possible for a visit in 1 week(s).   Specialty: Specialist Why: COPD/Bronchitis Contact information: [address] [place] [address] [phone]                 Discharge Exam: Filed Weights    03/01/21 1256  Weight: 98 kg   patient alert and oriented times three. Respiratory occ expiratory wheezing. No use of accessory muscle. Both heart sounds normal no murmur heard abdomen soft nontender benign patient is alert and oriented times three  Condition at discharge: stable  The results of significant diagnostics from this hospitalization (including imaging, microbiology, ancillary and laboratory) are listed below for reference.   Imaging Studies: DG Chest 2 View  Result Date: 03/01/2021 CLINICAL DATA:  Shortness of breath.  COPD exacerbation. EXAM: CHEST - 2 VIEW COMPARISON:  AP chest 04/22/2020 FINDINGS: Cardiac silhouette and mediastinal contours are within normal limits. The lungs are clear. No pleural effusion or pneumothorax. Mild dextrocurvature of the mid to lower thoracic spine with moderate multilevel degenerative disc changes. Diffuse decreased bone  mineralization. Moderate to severe right and moderate left acromioclavicular osteoarthritis. Cholecystectomy clips. IMPRESSION: No active cardiopulmonary disease. Electronically Signed   By: Yvonne Kendall M.D.   On: 03/01/2021 13:26    Microbiology: Results for orders placed or performed during the hospital encounter of 03/01/21  Resp Panel by RT-PCR (Flu A&B, Covid) Nasopharyngeal Swab     Status: None   Collection Time: 03/01/21  3:45 PM   Specimen: Nasopharyngeal Swab; Nasopharyngeal(NP) swabs in vial transport medium  Result Value Ref Range Status   SARS Coronavirus 2 by RT PCR NEGATIVE NEGATIVE Final    Comment: (NOTE) SARS-CoV-2 target nucleic acids are NOT DETECTED.  The SARS-CoV-2 RNA is generally detectable in upper respiratory specimens during the acute phase of infection. The lowest concentration of SARS-CoV-2 viral copies this assay can detect is 138 copies/mL. A negative result does not preclude SARS-Cov-2 infection and should not be used as the sole basis for treatment or other patient management decisions. A  negative result may occur with  improper specimen collection/handling, submission of specimen other than nasopharyngeal swab, presence of viral mutation(s) within the areas targeted by this assay, and inadequate number of viral copies(<138 copies/mL). A negative result must be combined with clinical observations, patient history, and epidemiological information. The expected result is Negative.  Fact Sheet for Patients:  EntrepreneurPulse.com.au  Fact Sheet for Healthcare Providers:  IncredibleEmployment.be  This test is no t yet approved or cleared by the [place] FDA and  has been authorized for detection and/or diagnosis of SARS-CoV-2 by FDA under an Emergency Use Authorization (EUA). This EUA will remain  in effect (meaning this test can be used) for the duration of the COVID-19 declaration under Section 564(b)(1) of the Act, 21 U.S.C.section 360bbb-3(b)(1), unless the authorization is terminated  or revoked sooner.       Influenza A by PCR NEGATIVE NEGATIVE Final   Influenza B by PCR NEGATIVE NEGATIVE Final    Comment: (NOTE) The Xpert Xpress SARS-CoV-2/FLU/RSV plus assay is intended as an aid in the diagnosis of influenza from Nasopharyngeal swab specimens and should not be used as a sole basis for treatment. Nasal washings and aspirates are unacceptable for Xpert Xpress SARS-CoV-2/FLU/RSV testing.  Fact Sheet for Patients: EntrepreneurPulse.com.au  Fact Sheet for Healthcare Providers: IncredibleEmployment.be  This test is not yet approved or cleared by the [place] FDA and has been authorized for detection and/or diagnosis of SARS-CoV-2 by FDA under an Emergency Use Authorization (EUA). This EUA will remain in effect (meaning this test can be used) for the duration of the COVID-19 declaration under Section 564(b)(1) of the Act, 21 U.S.C. section 360bbb-3(b)(1), unless the authorization  is terminated or revoked.  Performed at [facility], [address] [place], [place] [address]     Labs: CBC: Recent Labs  Lab 03/01/21 1306  WBC 7.4  HGB 12.7  HCT 39.4  MCV 100.8*  PLT 127   Basic Metabolic Panel: Recent Labs  Lab 03/01/21 1306  NA 139  K 4.6  CL 106  CO2 23  GLUCOSE 125*  BUN 12  CREATININE 1.08*  CALCIUM 8.7*   Liver Function Tests: No results for input(s): AST, ALT, ALKPHOS, BILITOT, PROT, ALBUMIN in the last 168 hours. CBG: No results for input(s): GLUCAP in the last 168 hours.  Discharge time spent: greater than 30 minutes.  Signed: Fritzi Mandes, MD [facility] 03/02/2021

## 2021-03-07 ENCOUNTER — Ambulatory Visit (INDEPENDENT_AMBULATORY_CARE_PROVIDER_SITE_OTHER): Payer: Medicare Other | Admitting: Family Medicine

## 2021-03-07 ENCOUNTER — Encounter: Payer: Self-pay | Admitting: Family Medicine

## 2021-03-07 ENCOUNTER — Other Ambulatory Visit: Payer: Self-pay

## 2021-03-07 VITALS — BP 115/50 | HR 52 | Ht 63.0 in | Wt 216.0 lb

## 2021-03-07 DIAGNOSIS — J432 Centrilobular emphysema: Secondary | ICD-10-CM

## 2021-03-07 DIAGNOSIS — R5383 Other fatigue: Secondary | ICD-10-CM

## 2021-03-07 NOTE — Progress Notes (Signed)
Subjective:    Patient ID: Jenna [name], female    DOB: 1935/12/18, 86 y.o.   MRN: [id number]  Jenna [name] is a 86 y.o. female presenting on 03/07/2021 for COPD, Hospitalization Follow-up, and Fatigue   HPI  HOSPITAL FOLLOW-UP VISIT  Hospital/Location: [facility] Date of Admission: 03/01/21 Date of Discharge: 03/02/21 Transitions of care telephone call: Not completed  Reason for Admission: COPD exacerbation  - Hospital H&P and Discharge Summary have been reviewed - Patient presents today 5 days after recent hospitalization. Brief summary of recent course, patient had symptoms of shortness of breath COPD exacerbation, hospitalized, treated with IV solu medrol, breathing treatment, doxycycline on discharge. - Today reports overall has done well after discharge. Symptoms of COPD exacerbation breathing has resolved. Returned to baseline.  She reports mainly has low energy and fatigue. Reduced energy. She was doing fairly well until yesterday Valentine's day she was eating too much candy.   I have reviewed the discharge medication list, and have reconciled the current and discharge medications today.   Current Outpatient Medications:    albuterol (PROVENTIL) (2.5 MG/3ML) 0.083% nebulizer solution, Take 3 mLs (2.5 mg total) by nebulization every 4 (four) hours as needed for wheezing or shortness of breath., Disp: 75 mL, Rfl: 5   albuterol (VENTOLIN HFA) 108 (90 Base) MCG/ACT inhaler, Inhale 2 puffs into the lungs every 6 (six) hours as needed for wheezing or shortness of breath., Disp: 1 each, Rfl: 3   alendronate (FOSAMAX) 70 MG tablet, Take 1 tablet (70 mg total) by mouth once a week., Disp: 12 tablet, Rfl: 0   Ascorbic Acid (VITAMIN C) 1000 MG tablet, Take 500 mg by mouth daily. , Disp: , Rfl:    Cholecalciferol 25 MCG (1000 UT) tablet, Take 1,000 Units by mouth daily. , Disp: , Rfl:    citalopram (CELEXA) 40 MG tablet, Take 1 tablet (40 mg total) by mouth daily., Disp: 90 tablet, Rfl:  1   Cyanocobalamin 1000 MCG TBCR, Take 1,000 mcg by mouth daily. , Disp: , Rfl:    famotidine (PEPCID) 20 MG tablet, Take 20 mg by mouth 2 (two) times daily., Disp: , Rfl:    ferrous sulfate 325 (65 FE) MG EC tablet, Take 325 mg by mouth at bedtime. , Disp: , Rfl:    furosemide (LASIX) 20 MG tablet, Take 1 tablet (20 mg total) by mouth daily as needed for fluid or edema. Take lasix daily, with extra dose as needed for swelling, Disp: 90 tablet, Rfl: 1   methocarbamol (ROBAXIN) 500 MG tablet, Take 1 tablet (500 mg total) by mouth every 8 (eight) hours as needed for muscle spasms., Disp: 60 tablet, Rfl: 2   metoprolol succinate (TOPROL-XL) 25 MG 24 hr tablet, Take 1 tablet (25 mg total) by mouth daily. Take an additional 12.5 mg - 25 mg as needed for palpitations, Disp: 90 tablet, Rfl: 3   Multiple Vitamins-Minerals (ONE-A-DAY WOMENS PETITES) TABS, Take 2 tablets by mouth every morning., Disp: , Rfl:    MYRBETRIQ 25 MG TB24 tablet, Take 1 tablet (25 mg total) by mouth daily., Disp: 90 tablet, Rfl: 1   Omega-3 Fatty Acids (FISH OIL) 1000 MG CAPS, Take 1,000 mg by mouth daily., Disp: , Rfl:    simvastatin (ZOCOR) 40 MG tablet, Take 1 tablet (40 mg total) by mouth daily., Disp: 90 tablet, Rfl: 1   terbinafine (LAMISIL) 250 MG tablet, Take 1 tablet (250 mg total) by mouth daily. For 1-3 months or until resolved., Disp:  30 tablet, Rfl: 2   traZODone (DESYREL) 100 MG tablet, Take 1 tablet (100 mg total) by mouth at bedtime., Disp: 90 tablet, Rfl: 3   TRELEGY ELLIPTA 100-62.5-25 MCG/INH AEPB, Inhale 1 puff into the lungs daily., Disp: , Rfl:   ------------------------------------------------------------------------- Social History   Tobacco Use   Smoking status: Former    Packs/day: 1.00    Years: 20.00    Pack years: 20.00    Types: Cigarettes    Quit date: 07/1981    Years since quitting: 39.6   Smokeless tobacco: Former  Scientific laboratory technician Use: Never used  Substance Use Topics   Alcohol use:  Not Currently    Comment: past   Drug use: No    Review of Systems Per HPI unless specifically indicated above     Objective:    BP (!) 115/50    Pulse (!) 52    Ht 5\' 3"  (1.6 m)    Wt 216 lb (98 kg)    SpO2 100%    BMI 38.26 kg/m   Wt Readings from Last 3 Encounters:  03/07/21 216 lb (98 kg)  03/01/21 216 lb (98 kg)  02/12/21 216 lb (98 kg)    Physical Exam Vitals and nursing note reviewed.  Constitutional:      General: She is not in acute distress.    Appearance: She is well-developed. She is obese. She is not diaphoretic.     Comments: Well-appearing, comfortable, cooperative  HENT:     Head: Normocephalic and atraumatic.  Eyes:     General:        Right eye: No discharge.        Left eye: No discharge.     Conjunctiva/sclera: Conjunctivae normal.  Neck:     Thyroid: No thyromegaly.  Cardiovascular:     Rate and Rhythm: Normal rate and regular rhythm.     Heart sounds: Normal heart sounds. No murmur heard. Pulmonary:     Effort: Pulmonary effort is normal. No respiratory distress.     Breath sounds: Normal breath sounds. No wheezing or rales.  Musculoskeletal:        General: Normal range of motion.     Cervical back: Normal range of motion and neck supple.  Lymphadenopathy:     Cervical: No cervical adenopathy.  Skin:    General: Skin is warm and dry.     Findings: No erythema or rash.  Neurological:     Mental Status: She is alert and oriented to person, place, and time.  Psychiatric:        Behavior: Behavior normal.     Comments: Well groomed, good eye contact, normal speech and thoughts   Results for orders placed or performed during the hospital encounter of 03/01/21  Resp Panel by RT-PCR (Flu A&B, Covid) Nasopharyngeal Swab   Specimen: Nasopharyngeal Swab; Nasopharyngeal(NP) swabs in vial transport medium  Result Value Ref Range   SARS Coronavirus 2 by RT PCR NEGATIVE NEGATIVE   Influenza A by PCR NEGATIVE NEGATIVE   Influenza B by PCR NEGATIVE  NEGATIVE  Basic metabolic panel  Result Value Ref Range   Sodium 139 135 - 145 mmol/L   Potassium 4.6 3.5 - 5.1 mmol/L   Chloride 106 98 - 111 mmol/L   CO2 23 22 - 32 mmol/L   Glucose, Bld 125 (H) 70 - 99 mg/dL   BUN 12 8 - 23 mg/dL   Creatinine, Ser 1.08 (H) 0.44 - 1.00 mg/dL  Calcium 8.7 (L) 8.9 - 10.3 mg/dL   GFR, Estimated 51 (L) >60 mL/min   Anion gap 10 5 - 15  CBC  Result Value Ref Range   WBC 7.4 4.0 - 10.5 K/uL   RBC 3.91 3.87 - 5.11 MIL/uL   Hemoglobin 12.7 12.0 - 15.0 g/dL   HCT 39.4 36.0 - 46.0 %   MCV 100.8 (H) 80.0 - 100.0 fL   MCH 32.5 26.0 - 34.0 pg   MCHC 32.2 30.0 - 36.0 g/dL   RDW 12.9 11.5 - 15.5 %   Platelets 212 150 - 400 K/uL   nRBC 0.0 0.0 - 0.2 %  Troponin I (High Sensitivity)  Result Value Ref Range   Troponin I (High Sensitivity) 4 <18 ng/L      Assessment & Plan:   Problem List Items Addressed This Visit   None Visit Diagnoses     Centrilobular emphysema (HCC)    -  Primary   Low energy       Other fatigue           HFU improved COPD exac  Keep on maintenance therapy Finish Doxy x 2 days, finished prednisone  No other issue on blood work or anemia.  Keep improving oral intake nutrition and hydration  Energy likely improve w/ rest and recovery  No orders of the defined types were placed in this encounter.   Follow up plan: Return if symptoms worsen or fail to improve.   Nobie Putnam, [facility] 03/07/2021, 11:35 AM

## 2021-03-07 NOTE — Patient Instructions (Addendum)
Thank you for coming to the office today.  Keep up the great work, keep improving nutrition.  Breathing COPD is better today.  No other issue on blood work or anemia.  Keep improving oral intake nutrition and hydration  Please schedule a Follow-up Appointment to: Return if symptoms worsen or fail to improve.  If you have any other questions or concerns, please feel free to call the office or send a message through [place]. You may also schedule an earlier appointment if necessary.  Additionally, you may be receiving a survey about your experience at our office within a few days to 1 week by e-mail or mail. We value your feedback.  Nobie Putnam, DO [place]

## 2021-03-08 DIAGNOSIS — J449 Chronic obstructive pulmonary disease, unspecified: Secondary | ICD-10-CM | POA: Diagnosis not present

## 2021-03-08 DIAGNOSIS — R296 Repeated falls: Secondary | ICD-10-CM | POA: Diagnosis not present

## 2021-03-08 DIAGNOSIS — M1990 Unspecified osteoarthritis, unspecified site: Secondary | ICD-10-CM | POA: Diagnosis not present

## 2021-03-09 DIAGNOSIS — J432 Centrilobular emphysema: Secondary | ICD-10-CM | POA: Diagnosis not present

## 2021-03-09 DIAGNOSIS — R0609 Other forms of dyspnea: Secondary | ICD-10-CM | POA: Diagnosis not present

## 2021-03-09 DIAGNOSIS — G4733 Obstructive sleep apnea (adult) (pediatric): Secondary | ICD-10-CM | POA: Diagnosis not present

## 2021-04-01 NOTE — Progress Notes (Signed)
Date:  8/00/3491   ID:  [name], DOB 1935-04-12, MRN [id number]  Patient Location:  [place] [address]   Provider location:   [facility], [place] office  PCP:  Olin Hauser, DO  Cardiologist:  Patsy Baltimore  Chief Complaint  Patient presents with   Hospitalization Follow-up    Hospital follow up for COPD. Medications verbally reviewed with patient.     History of Present Illness:    Ania Levay is a 86 y.o. female past medical history of obesity,  Hypertension, hyperlipidemia,  GERD,  chronic cough,  gastric bypass surgery,  depression,   chronic leg edema  Type 2 diabetes hemoglobin A1c 6.2 COPD Former smoker Stage III chronic kidney disease Stable chronic problem with mood, anxiety, secondary insomnia Periodic hospitalization for COPD exacerbation April 2022, February 23 presents for evaluation of pericardial effusion seen on CT scan,  Palpitations  Last seen in clinic by myself May 2022  Recently  in the hospital February 2023 for COPD Acute bronchitis with acute on chronic COPD exacerbation Treated with steroids, nebulizers Received doxycycline 7 days Has follow-up with Dr. Raul Del, pulmonary  Presents in a wheelchair Sedentary, legs getting weaker, has leg swelling ,chronic issue  Stress at home, son with CAD, MI Daughter living at their house,   hospital for treatment of COPD exacerbation Normal echo 04/24/2020 reviewed Metoprolol reduced to 12.5 at discharge Steroids, ABX,   Lasix list reports she is taking Lasix daily as needed Some confusion concerning her medications, reports that she  'takes lasix 20 BID"  Lab work reviewed Creatinine 1.0 Total cholesterol 167 LDL 69 HBA1C 6.4  EKG personally reviewed by myself on todays visit Shows normal sinus rhythm with rate 82 bpm no significant ST-T wave changes  Prior monitor reviewed May 2020, showed normal  sinus rhythm with 99 SVT runs lasting up to 4 minutes 30 secs, max rate of 210 bpm, and average rate 172 bpm.  Prior history of chronic cough. Her other big complaint didn't GERD symptoms. Several other family members have GERD as well. She has had chronic lower extremity edema, prior trauma to her left lower extremity. She takes Lasix 20 mg daily with no significant improvement     Past Medical History:  Diagnosis Date   Anxiety    Arthritis    neck, knees(before replacements)   Asthma    CKD (chronic kidney disease), stage III (HCC)    COPD (chronic obstructive pulmonary disease) ([place])    Encounter for colonoscopy due to history of adenomatous colonic polyps    GERD (gastroesophageal reflux disease)    Hearing loss of both ears    History of echocardiogram    a. 10/2013 Echo: EF 60-65%, no rwma, midlly dil LA. Rnl RV fxn.   Hyperlipidemia    Osteoporosis    Palpitations    PSVT (paroxysmal supraventricular tachycardia) ([place])    a. 05/2018 [facility]: 99 SVT runs. Fastest 218 x 4:30. Longest 4:38 w/ rate of 172.   Sleep apnea    resolved with gastric bypass   Syncope and collapse    Urine incontinence    Past Surgical History:  Procedure Laterality Date   BROW LIFT Bilateral 06/27/2015   Procedure: BLEPHAROPLASTY BILATERAL UPPER EYELIDS BILATERAL BLEPHAROTOSIS EYELIDS;  Surgeon: Karle Starch, MD;  Location: [place];  Service: Ophthalmology;  Laterality: Bilateral;  BILATERAL   CATARACT EXTRACTION W/PHACO Right 02/13/2015  Procedure: CATARACT EXTRACTION PHACO AND INTRAOCULAR LENS PLACEMENT (IOC);  Surgeon: Ronnell Freshwater, MD;  Location: [facility];  Service: Ophthalmology;  Laterality: Right;   CATARACT EXTRACTION W/PHACO Left 03/22/2015   Procedure: CATARACT EXTRACTION PHACO AND INTRAOCULAR LENS PLACEMENT (IOC);  Surgeon: Ronnell Freshwater, MD;  Location: [facility];  Service: Ophthalmology;  Laterality: Left;  [place]    COLECTOMY     COSMETIC SURGERY  2012   tummy tuck and excess skin removal   GASTRIC BYPASS  2010   REPLACEMENT TOTAL KNEE BILATERAL  1998   SHOULDER SURGERY  2009   left    TONSILLECTOMY     TOTAL ABDOMINAL HYSTERECTOMY  1979     Current Outpatient Medications on File Prior to Visit  Medication Sig Dispense Refill   albuterol (PROVENTIL) (2.5 MG/3ML) 0.083% nebulizer solution Take 3 mLs (2.5 mg total) by nebulization every 4 (four) hours as needed for wheezing or shortness of breath. 75 mL 5   albuterol (VENTOLIN HFA) 108 (90 Base) MCG/ACT inhaler Inhale 2 puffs into the lungs every 6 (six) hours as needed for wheezing or shortness of breath. 1 each 3   alendronate (FOSAMAX) 70 MG tablet Take 1 tablet (70 mg total) by mouth once a week. 12 tablet 0   Ascorbic Acid (VITAMIN C) 1000 MG tablet Take 500 mg by mouth daily.      Cholecalciferol 25 MCG (1000 UT) tablet Take 1,000 Units by mouth daily.      citalopram (CELEXA) 40 MG tablet Take 1 tablet (40 mg total) by mouth daily. 90 tablet 1   Cyanocobalamin 1000 MCG TBCR Take 1,000 mcg by mouth daily.      famotidine (PEPCID) 20 MG tablet Take 20 mg by mouth 2 (two) times daily.     ferrous sulfate 325 (65 FE) MG EC tablet Take 325 mg by mouth at bedtime.      furosemide (LASIX) 20 MG tablet Take 1 tablet (20 mg total) by mouth daily as needed for fluid or edema. Take lasix daily, with extra dose as needed for swelling 90 tablet 1   methocarbamol (ROBAXIN) 500 MG tablet Take 1 tablet (500 mg total) by mouth every 8 (eight) hours as needed for muscle spasms. 60 tablet 2   metoprolol succinate (TOPROL-XL) 25 MG 24 hr tablet Take 1 tablet (25 mg total) by mouth daily. Take an additional 12.5 mg - 25 mg as needed for palpitations 90 tablet 3   Multiple Vitamins-Minerals (ONE-A-DAY WOMENS PETITES) TABS Take 2 tablets by mouth every morning.     MYRBETRIQ 25 MG TB24 tablet Take 1 tablet (25 mg total) by mouth daily. 90 tablet 1   Omega-3 Fatty  Acids (FISH OIL) 1000 MG CAPS Take 1,000 mg by mouth daily.     simvastatin (ZOCOR) 40 MG tablet Take 1 tablet (40 mg total) by mouth daily. 90 tablet 1   terbinafine (LAMISIL) 250 MG tablet Take 1 tablet (250 mg total) by mouth daily. For 1-3 months or until resolved. 30 tablet 2   traZODone (DESYREL) 100 MG tablet Take 1 tablet (100 mg total) by mouth at bedtime. 90 tablet 3   TRELEGY ELLIPTA 100-62.5-25 MCG/INH AEPB Inhale 1 puff into the lungs daily.     No current facility-administered medications on file prior to visit.    Allergies:   Azithromycin, Hydromorphone, Morphine, Nsaids, Oxycodone-acetaminophen, Codeine, Nabumetone, Oxycodone, Oxycodone-acetaminophen, and Promethazine hcl   Social History   Tobacco Use  Smoking status: Former    Packs/day: 1.00    Years: 20.00    Pack years: 20.00    Types: Cigarettes    Quit date: 07/1981    Years since quitting: 39.7   Smokeless tobacco: Former  Scientific laboratory technician Use: Never used  Substance Use Topics   Alcohol use: Not Currently    Comment: past   Drug use: No      Family Hx: The patient's family history includes Colon cancer in her sister; Diabetes type II in her father and mother; Heart attack in her brother and mother; Heart attack (age of onset: 61) in her sister; Hyperlipidemia in her sister; Hypertension in her mother and sister; Ovarian cancer in her sister; Pneumonia in her father; Skin cancer in her father.  ROS:   Please see the history of present illness.    Review of Systems  Constitutional: Negative.   HENT: Negative.    Respiratory: Negative.    Cardiovascular:  Positive for palpitations.  Gastrointestinal: Negative.   Musculoskeletal: Negative.   Neurological: Negative.   Psychiatric/Behavioral: Negative.    All other systems reviewed and are negative.   Labs/Other Tests and Data Reviewed:    Recent Labs: 04/22/2020: B Natriuretic Peptide 138.4 02/05/2021: ALT 25; TSH 5.75 03/01/2021: BUN 12;  Creatinine, Ser 1.08; Hemoglobin 12.7; Platelets 212; Potassium 4.6; Sodium 139   Recent Lipid Panel Lab Results  Component Value Date/Time   CHOL 167 02/05/2021 08:11 AM   TRIG 182 (H) 02/05/2021 08:11 AM   HDL 72 02/05/2021 08:11 AM   CHOLHDL 2.3 02/05/2021 08:11 AM   LDLCALC 69 02/05/2021 08:11 AM    Wt Readings from Last 3 Encounters:  04/02/21 213 lb (96.6 kg)  03/07/21 216 lb (98 kg)  03/01/21 216 lb (98 kg)     Exam:    BP 122/76 (BP Location: Right Arm, Patient Position: Sitting, Cuff Size: Normal)    Pulse 82    Ht '5\' 3"'$  (1.6 m)    Wt 213 lb (96.6 kg)    SpO2 98%    BMI 37.73 kg/m  Constitutional:  oriented to person, place, and time. No distress.  HENT:  Head: Grossly normal Eyes:  no discharge. No scleral icterus.  Neck: No JVD, no carotid bruits  Cardiovascular: Regular rate and rhythm, no murmurs appreciated Pulmonary/Chest: Clear to auscultation bilaterally, no wheezes or rails Abdominal: Soft.  no distension.  no tenderness.  Musculoskeletal: Normal range of motion Neurological:  normal muscle tone. Coordination normal. No atrophy Skin: Skin warm and dry Psychiatric: normal affect, pleasant  ASSESSMENT & PLAN:    Paroxysmal tachycardia (HCC) Continue current dose metoprolol 25 daily with extra metoprolol as needed for breakthrough tachycardia Reports symptoms in general well controlled  Home stress Daughter living at her house, son with medical issues  Centrilobular emphysema ([place]) Breathing is stable, uses inhalers as needed Sedentary, deconditioned  Controlled type 2 diabetes mellitus with diabetic nephropathy, without long-term current use of insulin ([place]) We have encouraged continued exercise, careful diet management in an effort to lose weight.  Leg swelling Consistent with lymphedema, leg elevation, Ace wraps, consider lymphedema compression pumps  Hyperlipidemia associated with type 2 diabetes mellitus ([place]) Reasonably well-controlled  cholesterol numbers No changes made  Essential hypertension Blood pressure is well controlled on today's visit. No changes made to the medications.  Smoker Stopped smoking many years ago Still with residual COPD   Total encounter time more than 30 minutes  Greater than 50% was  spent in counseling and coordination of care with the patient     Signed, Ida Rogue, MD  04/02/2021 3:35 PM    [place] Office [address] [place] [address], [place], [place] [address]

## 2021-04-02 ENCOUNTER — Other Ambulatory Visit: Payer: Self-pay

## 2021-04-02 ENCOUNTER — Encounter: Payer: Self-pay | Admitting: Cardiovascular Disease

## 2021-04-02 ENCOUNTER — Ambulatory Visit: Payer: Medicare Other | Admitting: Cardiovascular Disease

## 2021-04-02 VITALS — BP 122/76 | HR 82 | Ht 63.0 in | Wt 213.0 lb

## 2021-04-02 DIAGNOSIS — E1121 Type 2 diabetes mellitus with diabetic nephropathy: Secondary | ICD-10-CM

## 2021-04-02 DIAGNOSIS — J432 Centrilobular emphysema: Secondary | ICD-10-CM

## 2021-04-02 DIAGNOSIS — R778 Other specified abnormalities of plasma proteins: Secondary | ICD-10-CM

## 2021-04-02 DIAGNOSIS — I471 Supraventricular tachycardia: Secondary | ICD-10-CM | POA: Diagnosis not present

## 2021-04-02 DIAGNOSIS — R600 Localized edema: Secondary | ICD-10-CM | POA: Diagnosis not present

## 2021-04-02 DIAGNOSIS — E782 Mixed hyperlipidemia: Secondary | ICD-10-CM | POA: Diagnosis not present

## 2021-04-02 NOTE — Patient Instructions (Addendum)
Medication Instructions:  ?No changes ? ?Please call and let me know how much furosemide (lasix) you are taking ? ? ?If you need a refill on your cardiac medications before your next appointment, please call your pharmacy.  ? ?Lab work: ?No new labs needed ? ?Testing/Procedures: ?No new testing needed ? ?Follow-Up: ?At [facility], you and your health needs are our priority.  As part of our continuing mission to provide you with exceptional heart care, we have created designated Provider Care Teams.  These Care Teams include your primary Cardiologist (physician) and Advanced Practice Providers (APPs -  Physician Assistants and Nurse Practitioners) who all work together to provide you with the care you need, when you need it. ? ?You will need a follow up appointment in 12 months ? ?Providers on your designated Care Team:   ?Murray Hodgkins, NP ?Christell Faith, PA-C ?Cadence Kathlen Mody, PA-C ? ?COVID-19 Vaccine Information can be found at: [email or web address] For questions related to vaccine distribution or appointments, please email [email or web address] or call [phone].  ? ?

## 2021-04-03 ENCOUNTER — Ambulatory Visit: Payer: Medicare Other

## 2021-04-05 DIAGNOSIS — J449 Chronic obstructive pulmonary disease, unspecified: Secondary | ICD-10-CM | POA: Diagnosis not present

## 2021-04-05 DIAGNOSIS — M1990 Unspecified osteoarthritis, unspecified site: Secondary | ICD-10-CM | POA: Diagnosis not present

## 2021-04-05 DIAGNOSIS — R296 Repeated falls: Secondary | ICD-10-CM | POA: Diagnosis not present

## 2021-04-06 ENCOUNTER — Encounter: Payer: Self-pay | Admitting: Family Medicine

## 2021-04-06 ENCOUNTER — Ambulatory Visit (INDEPENDENT_AMBULATORY_CARE_PROVIDER_SITE_OTHER): Payer: Medicare Other | Admitting: Family Medicine

## 2021-04-06 ENCOUNTER — Other Ambulatory Visit: Payer: Self-pay

## 2021-04-06 VITALS — BP 122/70 | HR 58 | Ht 63.0 in | Wt 213.0 lb

## 2021-04-06 DIAGNOSIS — M159 Polyosteoarthritis, unspecified: Secondary | ICD-10-CM | POA: Diagnosis not present

## 2021-04-06 DIAGNOSIS — G2581 Restless legs syndrome: Secondary | ICD-10-CM | POA: Diagnosis not present

## 2021-04-06 DIAGNOSIS — F331 Major depressive disorder, recurrent, moderate: Secondary | ICD-10-CM | POA: Diagnosis not present

## 2021-04-06 DIAGNOSIS — F5104 Psychophysiologic insomnia: Secondary | ICD-10-CM

## 2021-04-06 DIAGNOSIS — M62830 Muscle spasm of back: Secondary | ICD-10-CM

## 2021-04-06 MED ORDER — SERTRALINE HCL 50 MG PO TABS: 50.0000 mg | ORAL_TABLET | Freq: Every day | ORAL | 2 refills | Status: DC

## 2021-04-06 MED ORDER — GABAPENTIN 100 MG PO CAPS: ORAL_CAPSULE | ORAL | 1 refills | Status: DC

## 2021-04-06 MED ORDER — METHOCARBAMOL 500 MG PO TABS: 500.0000 mg | ORAL_TABLET | Freq: Three times a day (TID) | ORAL | 3 refills | Status: DC | PRN

## 2021-04-06 NOTE — Progress Notes (Signed)
? ?Subjective:  ? ? Patient ID: Jenna [name], female    DOB: [birth year], 86 y.o.   MRN: [id number] ? ?Kahlie Deutscher is a 87 y.o. female presenting on 04/06/2021 for Depression and Arthritis ? ? ?HPI ? ?Insomnia / Chronic Depression, recurrent / Anxiety ?Reviewed last visit with background on depression. She now reports primary life stressor involves living at home with daughter and husband various stressors including financial ?interval update without improvement in depression ?Family reporting significant problem with mood, depression not improving. ?- Last visit increased Trazodone from 50 to '100mg'$  nightly, it does seem to help rest and sleep ?- She continues on Citalopram '40mg'$  daily ?Admits some issues with husband staying up with her and makes it difficult to fall asleep, it makes her irritable ?- Husband upsets her because he doesn't hear her well and it makes her irritable easily. ? ? ?Osteoarthritis, multiple joints ?Scoliosis ?Back Spasms ?Restless Leg Syndrome ?Increased pain, mobility difficulty. ?Worse pain with activity and night keeping her up. ?Not interested in procedures or injections or referrals. ? ? ?Depression screen [facility] 2/9 04/06/2021 03/07/2021 02/12/2021  ?Decreased Interest '2 1 1  '$ ?Down, Depressed, Hopeless 1 0 2  ?PHQ - 2 Score '3 1 3  '$ ?Altered sleeping 2 0 0  ?Tired, decreased energy '1 2 3  '$ ?Change in appetite 0 1 1  ?Feeling bad or failure about yourself  0 0 0  ?Trouble concentrating 0 0 0  ?Moving slowly or fidgety/restless 0 0 0  ?Suicidal thoughts 0 0 0  ?PHQ-9 Score '6 4 7  '$ ?Difficult doing work/chores Somewhat difficult Not difficult at all Somewhat difficult  ?Some recent data might be hidden  ? ?GAD 7 : Generalized Anxiety Score 03/07/2021 02/01/2019 02/13/2018 11/13/2017  ?Nervous, Anxious, on Edge 0 1 1 0  ?Control/stop worrying 0 0 0 0  ?Worry too much - different things 0 0 0 0  ?Trouble relaxing 0 1 0 1  ?Restless 1 0 0 1  ?Easily annoyed or irritable 1 0 1 1  ?Afraid - awful  might happen 0 0 0 0  ?Total GAD 7 Score '2 2 2 3  '$ ?Anxiety Difficulty Not difficult at all Not difficult at all Somewhat difficult Not difficult at all  ? ? ? ? ?Social History  ? ?Tobacco Use  ? Smoking status: Former  ?  Packs/day: 1.00  ?  Years: 20.00  ?  Pack years: 20.00  ?  Types: Cigarettes  ?  Quit date: 07/1981  ?  Years since quitting: 39.7  ? Smokeless tobacco: Former  ?Vaping Use  ? Vaping Use: Never used  ?Substance Use Topics  ? Alcohol use: Not Currently  ?  Comment: past  ? Drug use: No  ? ? ?Review of Systems ?Per HPI unless specifically indicated above ? ?   ?Objective:  ?  ?BP 122/70   Pulse (!) 58   Ht '5\' 3"'$  (1.6 m)   Wt 213 lb (96.6 kg)   SpO2 97%   BMI 37.73 kg/m?   ?Wt Readings from Last 3 Encounters:  ?04/06/21 213 lb (96.6 kg)  ?04/02/21 213 lb (96.6 kg)  ?03/07/21 216 lb (98 kg)  ?  ?Physical Exam ?Vitals and nursing note reviewed.  ?Constitutional:   ?   General: She is not in acute distress. ?   Appearance: She is well-developed. She is obese. She is not diaphoretic.  ?   Comments: Well-appearing, comfortable, cooperative  ?HENT:  ?   Head: Normocephalic  and atraumatic.  ?Eyes:  ?   General:     ?   Right eye: No discharge.     ?   Left eye: No discharge.  ?   Conjunctiva/sclera: Conjunctivae normal.  ?Neck:  ?   Thyroid: No thyromegaly.  ?Cardiovascular:  ?   Rate and Rhythm: Normal rate and regular rhythm.  ?   Heart sounds: Normal heart sounds. No murmur heard. ?Pulmonary:  ?   Effort: Pulmonary effort is normal. No respiratory distress.  ?   Breath sounds: Normal breath sounds. No wheezing or rales.  ?Musculoskeletal:     ?   General: Normal range of motion.  ?   Cervical back: Normal range of motion and neck supple.  ?Lymphadenopathy:  ?   Cervical: No cervical adenopathy.  ?Skin: ?   General: Skin is warm and dry.  ?   Findings: No erythema or rash.  ?Neurological:  ?   Mental Status: She is alert and oriented to person, place, and time.  ?Psychiatric:     ?   Behavior:  Behavior normal.  ?   Comments: Well groomed, good eye contact, normal speech and thoughts  ? ?Results for orders placed or performed during the hospital encounter of 03/01/21  ?Resp Panel by RT-PCR (Flu A&B, Covid) Nasopharyngeal Swab  ? Specimen: Nasopharyngeal Swab; Nasopharyngeal(NP) swabs in vial transport medium  ?Result Value Ref Range  ? SARS Coronavirus 2 by RT PCR NEGATIVE NEGATIVE  ? Influenza A by PCR NEGATIVE NEGATIVE  ? Influenza B by PCR NEGATIVE NEGATIVE  ?Basic metabolic panel  ?Result Value Ref Range  ? Sodium 139 135 - 145 mmol/L  ? Potassium 4.6 3.5 - 5.1 mmol/L  ? Chloride 106 98 - 111 mmol/L  ? CO2 23 22 - 32 mmol/L  ? Glucose, Bld 125 (H) 70 - 99 mg/dL  ? BUN 12 8 - 23 mg/dL  ? Creatinine, Ser 1.08 (H) 0.44 - 1.00 mg/dL  ? Calcium 8.7 (L) 8.9 - 10.3 mg/dL  ? GFR, Estimated 51 (L) >60 mL/min  ? Anion gap 10 5 - 15  ?CBC  ?Result Value Ref Range  ? WBC 7.4 4.0 - 10.5 K/uL  ? RBC 3.91 3.87 - 5.11 MIL/uL  ? Hemoglobin 12.7 12.0 - 15.0 g/dL  ? HCT 39.4 36.0 - 46.0 %  ? MCV 100.8 (H) 80.0 - 100.0 fL  ? MCH 32.5 26.0 - 34.0 pg  ? MCHC 32.2 30.0 - 36.0 g/dL  ? RDW 12.9 11.5 - 15.5 %  ? Platelets 212 150 - 400 K/uL  ? nRBC 0.0 0.0 - 0.2 %  ?Troponin I (High Sensitivity)  ?Result Value Ref Range  ? Troponin I (High Sensitivity) 4 <18 ng/L  ? ?   ?Assessment & Plan:  ? ?Problem List Items Addressed This Visit   ? ? Psychophysiological insomnia  ? Relevant Medications  ? sertraline (ZOLOFT) 50 MG tablet  ? Primary osteoarthritis involving multiple joints  ? Relevant Medications  ? methocarbamol (ROBAXIN) 500 MG tablet  ? Depression, major, recurrent ([place]) - Primary  ?  Persistent mood w depression recurrent moderate ?Seems major stressors involve family living situation / financial ? ?Improved sleep on Trazodone '100mg'$  nightly ?Seems Celexa '40mg'$  daily less effective on for years, never on other option ? ?Taper instructions for tapering off Celexa and start new SSRI Sertraline '50mg'$  daily as advised when  ready per AVS 3 weeks ?Goal to reduce irritability more during day so she can feel more  like herself ?Emphasis on working on solution to family stressors with better communication, handout AVS try to look for therapy options to help problem solve ?  ?  ? Relevant Medications  ? sertraline (ZOLOFT) 50 MG tablet  ? ?Other Visit Diagnoses   ? ? Muscle spasm of back      ? Relevant Medications  ? methocarbamol (ROBAXIN) 500 MG tablet  ? RLS (restless legs syndrome)      ? Relevant Medications  ? gabapentin (NEURONTIN) 100 MG capsule  ? ?  ?  ?Back Pain ?RLS ?Discussed goal for improving pain management for her to help her improve activity and function, reviewed history of spinal disease OA/DJD and contributing factors to reduced mobility, ultimately may warrant more physical solution with PT / referral or ortho etc ?- For now will work on maximizing medication pain control for improved function ?- Increase Tylenol per AVS up to 1g TID regularly ?- Rx Gabapentin titration at night for pain and RLS / sleep ?- Re order Methocarbamol, it has been effective but rx ran out and med expired. Re order ? ?Meds ordered this encounter  ?Medications  ? sertraline (ZOLOFT) 50 MG tablet  ?  Sig: Take 1 tablet (50 mg total) by mouth daily.  ?  Dispense:  30 tablet  ?  Refill:  2  ? methocarbamol (ROBAXIN) 500 MG tablet  ?  Sig: Take 1 tablet (500 mg total) by mouth every 8 (eight) hours as needed for muscle spasms.  ?  Dispense:  90 tablet  ?  Refill:  3  ? gabapentin (NEURONTIN) 100 MG capsule  ?  Sig: Start 1 capsule daily, increase by 1 cap every 2-3 days as tolerated up to 3 times a day, or may take 3 at once in evening.  ?  Dispense:  90 capsule  ?  Refill:  1  ? ? ? ? ?Follow up plan: ?Return in about 3 months (around 07/07/2021) for 3 month follow-up back pain arthritis, depression. ? ? ?Nobie Putnam, DO ?[facility] ?[facility] ?04/06/2021, 2:00 PM ?

## 2021-04-06 NOTE — Assessment & Plan Note (Signed)
Persistent mood w depression recurrent moderate ?Seems major stressors involve family living situation / financial ? ?Improved sleep on Trazodone '100mg'$  nightly ?Seems Celexa '40mg'$  daily less effective on for years, never on other option ? ?Taper instructions for tapering off Celexa and start new SSRI Sertraline '50mg'$  daily as advised when ready per AVS 3 weeks ?Goal to reduce irritability more during day so she can feel more like herself ?Emphasis on working on solution to family stressors with better communication, handout AVS try to look for therapy options to help problem solve ?

## 2021-04-06 NOTE — Patient Instructions (Addendum)
Thank you for coming to the office today. ? ?Taper down off Citalopram (Celexa) ? ?Medication Taper Off Instructions ?Current med - Citalopram '40mg'$  ? ?Week 1: Alternate every OTHER day - 40 mg (whole tab) and then next day '20mg'$  (HALF tab) ?Week 2: 20 mg daily (HALF tablet) ?Week 3: '20mg'$  every OTHER day (HALF tablet) - alternating day is NO medicine (skip dose) ?STOP  completely ? ?Then start the new Sertraline '50mg'$  daily ? ?Continue Trazodone nightly ? ? ?THERAPIST ONLY  (No Psychiatry) ? ?[facility] ?[address] ?[place], [place] ?[address] [place] ?P: [phone] ? ?[place] [place], [place], [place] [address] ?Hours: Open today ? 9AM-7PM ?Phone: [phone] ? ?[facility] [address], [place] ?Address: [place], [place], [place] [address] ?Phone: [phone] ? ?[facility] (Virtual Available) ? [place] ?[address] ?[address] ?[place], [place] [address] ?Phone: [phone] ? ?[place] ?Address: [address], [place],  [address] ?[email or web address] ?Phone: [phone] ? ?------------------------------------------------------- ? ?Recommend to start taking Tylenol Extra Strength '500mg'$  tabs - take 1 to 2 tabs per dose (max '1000mg'$ ) every 6-8 hours for pain (take regularly, don't skip a dose for next 7 days), max 24 hour daily dose is 6 tablets or '3000mg'$ . In the future you can repeat the same everyday Tylenol course for 1-2 weeks at a time.  ? ?Refill Robaxin take it more regularly. Ordered it again ? ?Start Gabapentin '100mg'$  capsules, take at night for 2-3 nights only, and then increase to 2 times a day for a few days, and then may increase to 3 times a day, it may make you drowsy, if helps significantly at night only, then you can increase instead to 3 capsules at night, instead of 3 times a day ?- In the future if needed, we can significantly increase the dose if tolerated well, some common doses are  '300mg'$  three times a day up to '600mg'$  three times a day, usually it takes several weeks or months to get to higher doses ? ? ? ?Please schedule a Follow-up Appointment to: Return in about 3 months (around 07/07/2021) for 3 month follow-up back pain arthritis, depression. ? ?If you have any other questions or concerns, please feel free to call the office or send a message through [place]. You may also schedule an earlier appointment if necessary. ? ?Additionally, you may be receiving a survey about your experience at our office within a few days to 1 week by e-mail or mail. We value your feedback. ? ?Nobie Putnam, DO ?[place] ?

## 2021-04-09 ENCOUNTER — Ambulatory Visit: Payer: Medicare Other

## 2021-04-09 ENCOUNTER — Telehealth: Payer: Medicare Other

## 2021-04-16 ENCOUNTER — Ambulatory Visit: Payer: Medicare Other | Admitting: Family Medicine

## 2021-04-25 DIAGNOSIS — E113212 Type 2 diabetes mellitus with mild nonproliferative diabetic retinopathy with macular edema, left eye: Secondary | ICD-10-CM | POA: Diagnosis not present

## 2021-05-06 DIAGNOSIS — M1990 Unspecified osteoarthritis, unspecified site: Secondary | ICD-10-CM | POA: Diagnosis not present

## 2021-05-06 DIAGNOSIS — R296 Repeated falls: Secondary | ICD-10-CM | POA: Diagnosis not present

## 2021-05-06 DIAGNOSIS — J449 Chronic obstructive pulmonary disease, unspecified: Secondary | ICD-10-CM | POA: Diagnosis not present

## 2021-05-11 ENCOUNTER — Other Ambulatory Visit: Payer: Self-pay

## 2021-05-11 ENCOUNTER — Other Ambulatory Visit: Payer: Self-pay | Admitting: Cardiovascular Disease

## 2021-05-11 DIAGNOSIS — H35372 Puckering of macula, left eye: Secondary | ICD-10-CM | POA: Diagnosis not present

## 2021-05-11 DIAGNOSIS — I471 Supraventricular tachycardia: Secondary | ICD-10-CM

## 2021-05-11 DIAGNOSIS — E1169 Type 2 diabetes mellitus with other specified complication: Secondary | ICD-10-CM

## 2021-05-11 DIAGNOSIS — Z961 Presence of intraocular lens: Secondary | ICD-10-CM | POA: Diagnosis not present

## 2021-05-11 DIAGNOSIS — E113212 Type 2 diabetes mellitus with mild nonproliferative diabetic retinopathy with macular edema, left eye: Secondary | ICD-10-CM | POA: Diagnosis not present

## 2021-05-11 MED ORDER — SIMVASTATIN 40 MG PO TABS: 40.0000 mg | ORAL_TABLET | Freq: Every day | ORAL | 3 refills | Status: DC

## 2021-05-15 ENCOUNTER — Encounter: Payer: Self-pay | Admitting: Family Medicine

## 2021-05-15 ENCOUNTER — Ambulatory Visit (INDEPENDENT_AMBULATORY_CARE_PROVIDER_SITE_OTHER): Payer: Medicare Other | Admitting: Family Medicine

## 2021-05-15 VITALS — BP 132/86 | HR 92 | Ht 63.0 in | Wt 214.8 lb

## 2021-05-15 DIAGNOSIS — F5104 Psychophysiologic insomnia: Secondary | ICD-10-CM | POA: Diagnosis not present

## 2021-05-15 DIAGNOSIS — M159 Polyosteoarthritis, unspecified: Secondary | ICD-10-CM

## 2021-05-15 DIAGNOSIS — G2581 Restless legs syndrome: Secondary | ICD-10-CM

## 2021-05-15 DIAGNOSIS — F331 Major depressive disorder, recurrent, moderate: Secondary | ICD-10-CM | POA: Diagnosis not present

## 2021-05-15 DIAGNOSIS — J432 Centrilobular emphysema: Secondary | ICD-10-CM

## 2021-05-15 DIAGNOSIS — M150 Primary generalized (osteo)arthritis: Secondary | ICD-10-CM

## 2021-05-15 NOTE — Patient Instructions (Addendum)
Thank you for coming to the office today. ? ?Let me know if you choose a location for therapist and I can refer. ? ?Keep on Sertraline '50mg'$  daily, in the future we may need to increase dose if needed. ? ?Continue Gabapentin ? ? ?-------------------------------------------------------------- ? ?THERAPIST ONLY  (No Psychiatry) ?  ?[facility] ?[address] ?[place], [place] ?[address] [place] ?P: [phone] ?  ?[facility]   ?(I think they moved from [place] to [place]) ? ?Address: [address] [place], [place] [address] ?Phone: [phone] ? ?  ?[address], [place] ?Address: [place], [place], [place] [address] ?Phone: [phone] ?  ?[facility] (Virtual Available) ?[place] ?[address] ?[place], [place] [address] ?Phone: [phone] ?  ?[place] ?Address: [address] [place], [place] [address] ?[email or web address] ?Phone: [phone] ? ? ? ?Please schedule a Follow-up Appointment to: Return in about 5 months (around 10/15/2021) for 5 month follow-up Mood/Anxiety/Insomnia, COPD, Arthritis. ? ?If you have any other questions or concerns, please feel free to call the office or send a message through [place]. You may also schedule an earlier appointment if necessary. ? ?Additionally, you may be receiving a survey about your experience at our office within a few days to 1 week by e-mail or mail. We value your feedback. ? ?Nobie Putnam, DO ?[place] ?

## 2021-05-15 NOTE — Progress Notes (Signed)
? ?Subjective:  ? ? Patient ID: Jenna [name], female    DOB: Nov 14, 1935, 86 y.o.   MRN: [id number] ? ?Jenna [name] is a 86 y.o. female presenting on 05/15/2021 for COPD and Depression ? ? ?HPI ? ?Insomnia / Chronic Depression, recurrent / Anxiety ?Reviewed last visit with background on depression. She now reports primary life stressor involves living at home with daughter and husband various stressors including financial ?interval update without improvement in depression ?Family reporting significant problem with mood, depression not improving. ? ?Interval update since last 1 month visit, was on Citalopram '40mg'$ , and has tapered off and newly started Sertraline '50mg'$  daily, with improvement in mood and anxiety and sleep ?Continued Trazodone '100mg'$  daily ?   ?Osteoarthritis, multiple joints ?Scoliosis ?Back Spasms ?Restless Leg Syndrome ?Interval updates, she has started on Gabapentin ?Taking Tylenol  and Methocarbamol 3 times a day with some relief. Also newly started Gabapentin at bedtime for pain and restless leg with improvement. ?Other factors are impacting her sleep, with family members having to get up early, waking her up. ?Not interested in procedures or injections or referrals. ? ?Centrilobular Emphysema (COPD) ?Followed by Dr Minerva Areola Pulmonology ?Next apt in June 2023 ?On Trelegy currently doing well ?Has a wheelchair now through [employer] due to COPD ?Continues on Albuterol nebulizer ? ? ? ?  04/06/2021  ?  5:49 PM 03/07/2021  ? 11:19 AM 02/12/2021  ?  2:57 PM  ?Depression screen PHQ 2/9  ?Decreased Interest '2 1 1  '$ ?Down, Depressed, Hopeless 1 0 2  ?PHQ - 2 Score '3 1 3  '$ ?Altered sleeping 2 0 0  ?Tired, decreased energy '1 2 3  '$ ?Change in appetite 0 1 1  ?Feeling bad or failure about yourself  0 0 0  ?Trouble concentrating 0 0 0  ?Moving slowly or fidgety/restless 0 0 0  ?Suicidal thoughts 0 0 0  ?PHQ-9 Score '6 4 7  '$ ?Difficult doing work/chores Somewhat difficult Not difficult at all Somewhat difficult  ? ? ?   03/07/2021  ? 11:19 AM 02/01/2019  ?  1:34 PM 02/13/2018  ?  3:10 PM 11/13/2017  ?  3:51 PM  ?GAD 7 : Generalized Anxiety Score  ?Nervous, Anxious, on Edge 0 1 1 0  ?Control/stop worrying 0 0 0 0  ?Worry too much - different things 0 0 0 0  ?Trouble relaxing 0 1 0 1  ?Restless 1 0 0 1  ?Easily annoyed or irritable 1 0 1 1  ?Afraid - awful might happen 0 0 0 0  ?Total GAD 7 Score '2 2 2 3  '$ ?Anxiety Difficulty Not difficult at all Not difficult at all Somewhat difficult Not difficult at all  ? ? ? ? ?Social History  ? ?Tobacco Use  ? Smoking status: Former  ?  Packs/day: 1.00  ?  Years: 20.00  ?  Pack years: 20.00  ?  Types: Cigarettes  ?  Quit date: 07/1981  ?  Years since quitting: 39.8  ? Smokeless tobacco: Former  ?Vaping Use  ? Vaping Use: Never used  ?Substance Use Topics  ? Alcohol use: Not Currently  ?  Comment: past  ? Drug use: No  ? ? ?Review of Systems ?Per HPI unless specifically indicated above ? ?   ?Objective:  ?  ?BP 132/86   Pulse 92   Ht '5\' 3"'$  (1.6 m)   Wt 214 lb 12.8 oz (97.4 kg)   SpO2 100%   BMI 38.05 kg/m?   ?  Wt Readings from Last 3 Encounters:  ?05/15/21 214 lb 12.8 oz (97.4 kg)  ?04/06/21 213 lb (96.6 kg)  ?04/02/21 213 lb (96.6 kg)  ?  ?Physical Exam ?Vitals and nursing note reviewed.  ?Constitutional:   ?   General: She is not in acute distress. ?   Appearance: She is well-developed. She is obese. She is not diaphoretic.  ?   Comments: Well-appearing, comfortable, cooperative  ?HENT:  ?   Head: Normocephalic and atraumatic.  ?Eyes:  ?   General:     ?   Right eye: No discharge.     ?   Left eye: No discharge.  ?   Conjunctiva/sclera: Conjunctivae normal.  ?Neck:  ?   Thyroid: No thyromegaly.  ?Cardiovascular:  ?   Rate and Rhythm: Normal rate and regular rhythm.  ?   Heart sounds: Normal heart sounds. No murmur heard. ?Pulmonary:  ?   Effort: Pulmonary effort is normal. No respiratory distress.  ?   Breath sounds: Wheezing (upper airway wheezing prompted with deep breathing, speaks full  sentences) present. No rales.  ?Musculoskeletal:     ?   General: Normal range of motion.  ?   Cervical back: Normal range of motion and neck supple.  ?Lymphadenopathy:  ?   Cervical: No cervical adenopathy.  ?Skin: ?   General: Skin is warm and dry.  ?   Findings: No erythema or rash.  ?Neurological:  ?   Mental Status: She is alert and oriented to person, place, and time.  ?Psychiatric:     ?   Behavior: Behavior normal.  ?   Comments: Well groomed, good eye contact, normal speech and thoughts  ? ?Results for orders placed or performed during the hospital encounter of 03/01/21  ?Resp Panel by RT-PCR (Flu A&B, Covid) Nasopharyngeal Swab  ? Specimen: Nasopharyngeal Swab; Nasopharyngeal(NP) swabs in vial transport medium  ?Result Value Ref Range  ? SARS Coronavirus 2 by RT PCR NEGATIVE NEGATIVE  ? Influenza A by PCR NEGATIVE NEGATIVE  ? Influenza B by PCR NEGATIVE NEGATIVE  ?Basic metabolic panel  ?Result Value Ref Range  ? Sodium 139 135 - 145 mmol/L  ? Potassium 4.6 3.5 - 5.1 mmol/L  ? Chloride 106 98 - 111 mmol/L  ? CO2 23 22 - 32 mmol/L  ? Glucose, Bld 125 (H) 70 - 99 mg/dL  ? BUN 12 8 - 23 mg/dL  ? Creatinine, Ser 1.08 (H) 0.44 - 1.00 mg/dL  ? Calcium 8.7 (L) 8.9 - 10.3 mg/dL  ? GFR, Estimated 51 (L) >60 mL/min  ? Anion gap 10 5 - 15  ?CBC  ?Result Value Ref Range  ? WBC 7.4 4.0 - 10.5 K/uL  ? RBC 3.91 3.87 - 5.11 MIL/uL  ? Hemoglobin 12.7 12.0 - 15.0 g/dL  ? HCT 39.4 36.0 - 46.0 %  ? MCV 100.8 (H) 80.0 - 100.0 fL  ? MCH 32.5 26.0 - 34.0 pg  ? MCHC 32.2 30.0 - 36.0 g/dL  ? RDW 12.9 11.5 - 15.5 %  ? Platelets 212 150 - 400 K/uL  ? nRBC 0.0 0.0 - 0.2 %  ?Troponin I (High Sensitivity)  ?Result Value Ref Range  ? Troponin I (High Sensitivity) 4 <18 ng/L  ? ?   ?Assessment & Plan:  ? ?Problem List Items Addressed This Visit   ? ? Psychophysiological insomnia  ? Primary osteoarthritis involving multiple joints  ? Depression, major, recurrent ([place]) - Primary  ? ?Other Visit Diagnoses   ? ?  Centrilobular emphysema ([place])       ? RLS (restless legs syndrome)      ? ?  ?  ?RLS / Pain/Insomnia ?Improved on Gabapentin ?Continue regimen ? ?Major Depression recurrent ?Improved on Sertraline '50mg'$ . Will continue dose can adjust as needed ?Handout AVS again on therapist recommendations,  notify for referral or schedule. ? ?Centrilobular Emphysema ?Continue therapy, at baseline today without flare. Has difficulty with episodic mild flares, working with Pulmonology. ? ?No orders of the defined types were placed in this encounter. ? ? ? ? ?Follow up plan: ?Return in about 5 months (around 10/15/2021) for 5 month follow-up Mood/Anxiety/Insomnia, COPD, Arthritis. ? ?Nobie Putnam, DO ?[facility] ?[facility] ?05/15/2021, 2:07 PM ?

## 2021-05-17 ENCOUNTER — Other Ambulatory Visit: Payer: Self-pay

## 2021-05-17 ENCOUNTER — Emergency Department: Payer: Medicare Other

## 2021-05-17 ENCOUNTER — Inpatient Hospital Stay
Admission: EM | Admit: 2021-05-17 | Discharge: 2021-05-21 | DRG: 189 | Disposition: A | Discharge status: Home / Self Care | Payer: Medicare Other | Attending: Internal Medicine | Admitting: Internal Medicine

## 2021-05-17 DIAGNOSIS — E785 Hyperlipidemia, unspecified: Secondary | ICD-10-CM | POA: Diagnosis not present

## 2021-05-17 DIAGNOSIS — F32A Depression, unspecified: Secondary | ICD-10-CM | POA: Diagnosis present

## 2021-05-17 DIAGNOSIS — Z20822 Contact with and (suspected) exposure to covid-19: Secondary | ICD-10-CM | POA: Diagnosis present

## 2021-05-17 DIAGNOSIS — Z888 Allergy status to other drugs, medicaments and biological substances status: Secondary | ICD-10-CM

## 2021-05-17 DIAGNOSIS — I471 Supraventricular tachycardia, unspecified: Secondary | ICD-10-CM

## 2021-05-17 DIAGNOSIS — Z8659 Personal history of other mental and behavioral disorders: Secondary | ICD-10-CM | POA: Diagnosis not present

## 2021-05-17 DIAGNOSIS — J449 Chronic obstructive pulmonary disease, unspecified: Secondary | ICD-10-CM | POA: Diagnosis not present

## 2021-05-17 DIAGNOSIS — M810 Age-related osteoporosis without current pathological fracture: Secondary | ICD-10-CM | POA: Diagnosis present

## 2021-05-17 DIAGNOSIS — Z96653 Presence of artificial knee joint, bilateral: Secondary | ICD-10-CM | POA: Diagnosis present

## 2021-05-17 DIAGNOSIS — B370 Candidal stomatitis: Secondary | ICD-10-CM | POA: Diagnosis not present

## 2021-05-17 DIAGNOSIS — E1165 Type 2 diabetes mellitus with hyperglycemia: Secondary | ICD-10-CM | POA: Diagnosis not present

## 2021-05-17 DIAGNOSIS — B379 Candidiasis, unspecified: Secondary | ICD-10-CM | POA: Diagnosis not present

## 2021-05-17 DIAGNOSIS — I4892 Unspecified atrial flutter: Secondary | ICD-10-CM | POA: Diagnosis not present

## 2021-05-17 DIAGNOSIS — M1909 Primary osteoarthritis, other specified site: Secondary | ICD-10-CM | POA: Diagnosis not present

## 2021-05-17 DIAGNOSIS — E119 Type 2 diabetes mellitus without complications: Secondary | ICD-10-CM | POA: Diagnosis not present

## 2021-05-17 DIAGNOSIS — K219 Gastro-esophageal reflux disease without esophagitis: Secondary | ICD-10-CM | POA: Diagnosis not present

## 2021-05-17 DIAGNOSIS — R000 Tachycardia, unspecified: Secondary | ICD-10-CM

## 2021-05-17 DIAGNOSIS — J432 Centrilobular emphysema: Secondary | ICD-10-CM

## 2021-05-17 DIAGNOSIS — Z6838 Body mass index (BMI) 38.0-38.9, adult: Secondary | ICD-10-CM | POA: Diagnosis not present

## 2021-05-17 DIAGNOSIS — J441 Chronic obstructive pulmonary disease with (acute) exacerbation: Secondary | ICD-10-CM

## 2021-05-17 DIAGNOSIS — Z885 Allergy status to narcotic agent status: Secondary | ICD-10-CM

## 2021-05-17 DIAGNOSIS — F419 Anxiety disorder, unspecified: Secondary | ICD-10-CM | POA: Diagnosis present

## 2021-05-17 DIAGNOSIS — E1122 Type 2 diabetes mellitus with diabetic chronic kidney disease: Secondary | ICD-10-CM | POA: Diagnosis present

## 2021-05-17 DIAGNOSIS — H9193 Unspecified hearing loss, bilateral: Secondary | ICD-10-CM | POA: Diagnosis present

## 2021-05-17 DIAGNOSIS — I129 Hypertensive chronic kidney disease with stage 1 through stage 4 chronic kidney disease, or unspecified chronic kidney disease: Secondary | ICD-10-CM | POA: Diagnosis present

## 2021-05-17 DIAGNOSIS — N183 Chronic kidney disease, stage 3 unspecified: Secondary | ICD-10-CM

## 2021-05-17 DIAGNOSIS — I890 Lymphedema, not elsewhere classified: Secondary | ICD-10-CM | POA: Diagnosis present

## 2021-05-17 DIAGNOSIS — I479 Paroxysmal tachycardia, unspecified: Secondary | ICD-10-CM

## 2021-05-17 DIAGNOSIS — Z90710 Acquired absence of both cervix and uterus: Secondary | ICD-10-CM

## 2021-05-17 DIAGNOSIS — E871 Hypo-osmolality and hyponatremia: Secondary | ICD-10-CM | POA: Diagnosis not present

## 2021-05-17 DIAGNOSIS — Z7951 Long term (current) use of inhaled steroids: Secondary | ICD-10-CM

## 2021-05-17 DIAGNOSIS — Z79899 Other long term (current) drug therapy: Secondary | ICD-10-CM | POA: Diagnosis not present

## 2021-05-17 DIAGNOSIS — Z881 Allergy status to other antibiotic agents status: Secondary | ICD-10-CM

## 2021-05-17 DIAGNOSIS — Z8249 Family history of ischemic heart disease and other diseases of the circulatory system: Secondary | ICD-10-CM

## 2021-05-17 DIAGNOSIS — N1831 Chronic kidney disease, stage 3a: Secondary | ICD-10-CM | POA: Diagnosis present

## 2021-05-17 DIAGNOSIS — Z9884 Bariatric surgery status: Secondary | ICD-10-CM

## 2021-05-17 DIAGNOSIS — Z83438 Family history of other disorder of lipoprotein metabolism and other lipidemia: Secondary | ICD-10-CM

## 2021-05-17 DIAGNOSIS — J9601 Acute respiratory failure with hypoxia: Secondary | ICD-10-CM | POA: Diagnosis not present

## 2021-05-17 DIAGNOSIS — I4891 Unspecified atrial fibrillation: Secondary | ICD-10-CM | POA: Diagnosis not present

## 2021-05-17 DIAGNOSIS — T380X5A Adverse effect of glucocorticoids and synthetic analogues, initial encounter: Secondary | ICD-10-CM | POA: Diagnosis not present

## 2021-05-17 DIAGNOSIS — R490 Dysphonia: Secondary | ICD-10-CM | POA: Diagnosis not present

## 2021-05-17 DIAGNOSIS — E669 Obesity, unspecified: Secondary | ICD-10-CM

## 2021-05-17 DIAGNOSIS — R0603 Acute respiratory distress: Secondary | ICD-10-CM

## 2021-05-17 DIAGNOSIS — Z87891 Personal history of nicotine dependence: Secondary | ICD-10-CM

## 2021-05-17 DIAGNOSIS — Z833 Family history of diabetes mellitus: Secondary | ICD-10-CM

## 2021-05-17 DIAGNOSIS — R0602 Shortness of breath: Secondary | ICD-10-CM | POA: Diagnosis not present

## 2021-05-17 DIAGNOSIS — Z993 Dependence on wheelchair: Secondary | ICD-10-CM

## 2021-05-17 LAB — RESP PANEL BY RT-PCR (FLU A&B, COVID) ARPGX2
Influenza A by PCR: NEGATIVE
Influenza B by PCR: NEGATIVE
SARS Coronavirus 2 by RT PCR: NEGATIVE

## 2021-05-17 LAB — COMPREHENSIVE METABOLIC PANEL
ALT: 20 U/L (ref 0–44)
AST: 29 U/L (ref 15–41)
Albumin: 3.9 g/dL (ref 3.5–5.0)
Alkaline Phosphatase: 68 U/L (ref 38–126)
Anion gap: 10 (ref 5–15)
BUN: 10 mg/dL (ref 8–23)
CO2: 24 mmol/L (ref 22–32)
Calcium: 9.4 mg/dL (ref 8.9–10.3)
Chloride: 103 mmol/L (ref 98–111)
Creatinine, Ser: 0.96 mg/dL (ref 0.44–1.00)
GFR, Estimated: 59 mL/min — ABNORMAL LOW (ref >60)
Glucose, Bld: 205 mg/dL — ABNORMAL HIGH (ref 70–99)
Potassium: 3.8 mmol/L (ref 3.5–5.1)
Sodium: 137 mmol/L (ref 135–145)
Total Bilirubin: 0.8 mg/dL (ref 0.3–1.2)
Total Protein: 6.8 g/dL (ref 6.5–8.1)

## 2021-05-17 LAB — BLOOD GAS, VENOUS
Acid-Base Excess: 3.3 mmol/L — ABNORMAL HIGH (ref 0.0–2.0)
Bicarbonate: 27.8 mmol/L (ref 20.0–28.0)
O2 Saturation: 96.3 %
Patient temperature: 37
pCO2, Ven: 41 mmHg — ABNORMAL LOW (ref 44–60)
pH, Ven: 7.44 — ABNORMAL HIGH (ref 7.25–7.43)
pO2, Ven: 74 mmHg — ABNORMAL HIGH (ref 32–45)

## 2021-05-17 LAB — CBC WITH DIFFERENTIAL/PLATELET
Abs Immature Granulocytes: 0.05 10*3/uL (ref 0.00–0.07)
Basophils Absolute: 0.1 10*3/uL (ref 0.0–0.1)
Basophils Relative: 1 %
Eosinophils Absolute: 0.1 10*3/uL (ref 0.0–0.5)
Eosinophils Relative: 1 %
HCT: 37.3 % (ref 36.0–46.0)
Hemoglobin: 12.2 g/dL (ref 12.0–15.0)
Immature Granulocytes: 0 %
Lymphocytes Relative: 15 %
Lymphs Abs: 1.8 10*3/uL (ref 0.7–4.0)
MCH: 32.2 pg (ref 26.0–34.0)
MCHC: 32.7 g/dL (ref 30.0–36.0)
MCV: 98.4 fL (ref 80.0–100.0)
Monocytes Absolute: 1.2 10*3/uL — ABNORMAL HIGH (ref 0.1–1.0)
Monocytes Relative: 10 %
Neutro Abs: 9.1 10*3/uL — ABNORMAL HIGH (ref 1.7–7.7)
Neutrophils Relative %: 73 %
Platelets: 208 10*3/uL (ref 150–400)
RBC: 3.79 MIL/uL — ABNORMAL LOW (ref 3.87–5.11)
RDW: 12.7 % (ref 11.5–15.5)
WBC: 12.3 10*3/uL — ABNORMAL HIGH (ref 4.0–10.5)
nRBC: 0 % (ref 0.0–0.2)

## 2021-05-17 LAB — BRAIN NATRIURETIC PEPTIDE: B Natriuretic Peptide: 72.3 pg/mL (ref 0.0–100.0)

## 2021-05-17 LAB — GLUCOSE, CAPILLARY: Glucose-Capillary: 245 mg/dL — ABNORMAL HIGH (ref 70–99)

## 2021-05-17 LAB — CBG MONITORING, ED
Glucose-Capillary: 289 mg/dL — ABNORMAL HIGH (ref 70–99)
Glucose-Capillary: 292 mg/dL — ABNORMAL HIGH (ref 70–99)
Glucose-Capillary: 195 mg/dL — ABNORMAL HIGH (ref 70–99)

## 2021-05-17 LAB — TROPONIN I (HIGH SENSITIVITY)
Troponin I (High Sensitivity): 9 ng/L (ref <18)
Troponin I (High Sensitivity): 11 ng/L (ref <18)

## 2021-05-17 MED ORDER — BISOPROLOL FUMARATE 5 MG PO TABS
5.0000 mg | ORAL_TABLET | Freq: Every day | ORAL | Status: DC
  Administered 2021-05-17 – 2021-05-18 (×2): 5 mg via ORAL
  Filled 2021-05-17 (×3): qty 1

## 2021-05-17 MED ORDER — METOPROLOL SUCCINATE ER 50 MG PO TB24: 50.0000 mg | ORAL_TABLET | Freq: Every day | ORAL | Status: DC

## 2021-05-17 MED ORDER — DILTIAZEM HCL-DEXTROSE 125-5 MG/125ML-% IV SOLN (PREMIX)
5.0000 mg/h | INTRAVENOUS | Status: DC
  Administered 2021-05-17: 5 mg/h via INTRAVENOUS
  Filled 2021-05-17: qty 125

## 2021-05-17 MED ORDER — INSULIN ASPART 100 UNIT/ML IJ SOLN
0.0000 [IU] | Freq: Three times a day (TID) | INTRAMUSCULAR | Status: DC
  Administered 2021-05-17: 3 [IU] via SUBCUTANEOUS
  Administered 2021-05-17 (×2): 8 [IU] via SUBCUTANEOUS
  Administered 2021-05-18: 11 [IU] via SUBCUTANEOUS
  Administered 2021-05-18: 5 [IU] via SUBCUTANEOUS
  Administered 2021-05-18: 2 [IU] via SUBCUTANEOUS
  Administered 2021-05-19: 8 [IU] via SUBCUTANEOUS
  Administered 2021-05-19 – 2021-05-20 (×4): 5 [IU] via SUBCUTANEOUS
  Administered 2021-05-21: 8 [IU] via SUBCUTANEOUS
  Administered 2021-05-21: 5 [IU] via SUBCUTANEOUS
  Filled 2021-05-17 (×13): qty 1

## 2021-05-17 MED ORDER — INSULIN ASPART 100 UNIT/ML IJ SOLN
0.0000 [IU] | Freq: Every day | INTRAMUSCULAR | Status: DC
  Administered 2021-05-17: 2 [IU] via SUBCUTANEOUS
  Administered 2021-05-19: 3 [IU] via SUBCUTANEOUS
  Administered 2021-05-20: 2 [IU] via SUBCUTANEOUS
  Filled 2021-05-17 (×3): qty 1

## 2021-05-17 MED ORDER — ADENOSINE 12 MG/4ML IV SOLN
INTRAVENOUS | Status: AC
  Filled 2021-05-17: qty 8

## 2021-05-17 MED ORDER — IPRATROPIUM-ALBUTEROL 0.5-2.5 (3) MG/3ML IN SOLN
3.0000 mL | Freq: Once | RESPIRATORY_TRACT | Status: AC
  Administered 2021-05-17: 3 mL via RESPIRATORY_TRACT
  Filled 2021-05-17: qty 3

## 2021-05-17 MED ORDER — ASCORBIC ACID 500 MG PO TABS
500.0000 mg | ORAL_TABLET | Freq: Every day | ORAL | Status: DC
  Administered 2021-05-17 – 2021-05-21 (×5): 500 mg via ORAL
  Filled 2021-05-17 (×5): qty 1

## 2021-05-17 MED ORDER — TRAZODONE HCL 100 MG PO TABS
100.0000 mg | ORAL_TABLET | Freq: Every day | ORAL | Status: DC
  Administered 2021-05-17 – 2021-05-20 (×4): 100 mg via ORAL
  Filled 2021-05-17 (×4): qty 1

## 2021-05-17 MED ORDER — LEVALBUTEROL HCL 0.63 MG/3ML IN NEBU
0.6300 mg | INHALATION_SOLUTION | Freq: Four times a day (QID) | RESPIRATORY_TRACT | Status: DC | PRN
  Administered 2021-05-17 – 2021-05-20 (×2): 0.63 mg via RESPIRATORY_TRACT
  Filled 2021-05-17 (×2): qty 3

## 2021-05-17 MED ORDER — UMECLIDINIUM BROMIDE 62.5 MCG/ACT IN AEPB
1.0000 | INHALATION_SPRAY | Freq: Every day | RESPIRATORY_TRACT | Status: DC
  Administered 2021-05-18 – 2021-05-21 (×4): 1 via RESPIRATORY_TRACT
  Filled 2021-05-17: qty 7

## 2021-05-17 MED ORDER — ENOXAPARIN SODIUM 60 MG/0.6ML IJ SOSY
0.5000 mg/kg | PREFILLED_SYRINGE | INTRAMUSCULAR | Status: DC
  Administered 2021-05-17 – 2021-05-21 (×5): 47.5 mg via SUBCUTANEOUS
  Filled 2021-05-17 (×5): qty 0.6

## 2021-05-17 MED ORDER — SERTRALINE HCL 50 MG PO TABS
50.0000 mg | ORAL_TABLET | Freq: Every day | ORAL | Status: DC
  Administered 2021-05-17 – 2021-05-21 (×5): 50 mg via ORAL
  Filled 2021-05-17 (×5): qty 1

## 2021-05-17 MED ORDER — ONDANSETRON HCL 4 MG/2ML IJ SOLN
INTRAMUSCULAR | Status: AC
  Administered 2021-05-17: 4 mg
  Filled 2021-05-17: qty 2

## 2021-05-17 MED ORDER — FERROUS SULFATE 325 (65 FE) MG PO TABS
325.0000 mg | ORAL_TABLET | Freq: Every day | ORAL | Status: DC
Start: 2021-05-17 — End: 2021-05-21
  Administered 2021-05-17 – 2021-05-20 (×4): 325 mg via ORAL
  Filled 2021-05-17 (×4): qty 1

## 2021-05-17 MED ORDER — ONDANSETRON HCL 4 MG PO TABS: 4.0000 mg | ORAL_TABLET | Freq: Four times a day (QID) | ORAL | Status: DC | PRN

## 2021-05-17 MED ORDER — METHYLPREDNISOLONE SODIUM SUCC 40 MG IJ SOLR
40.0000 mg | Freq: Two times a day (BID) | INTRAMUSCULAR | Status: DC
Start: 2021-05-17 — End: 2021-05-21
  Administered 2021-05-17 – 2021-05-21 (×9): 40 mg via INTRAVENOUS
  Filled 2021-05-17 (×9): qty 1

## 2021-05-17 MED ORDER — LEVALBUTEROL HCL 0.63 MG/3ML IN NEBU
1.2500 mg | INHALATION_SOLUTION | Freq: Four times a day (QID) | RESPIRATORY_TRACT | Status: DC
  Administered 2021-05-17 – 2021-05-21 (×17): 1.25 mg via RESPIRATORY_TRACT
  Filled 2021-05-17 (×19): qty 6

## 2021-05-17 MED ORDER — VITAMIN B-12 1000 MCG PO TABS
1000.0000 ug | ORAL_TABLET | Freq: Every day | ORAL | Status: DC
  Administered 2021-05-17 – 2021-05-21 (×5): 1000 ug via ORAL
  Filled 2021-05-17 (×5): qty 1

## 2021-05-17 MED ORDER — ADENOSINE 6 MG/2ML IV SOLN
INTRAVENOUS | Status: AC
  Administered 2021-05-17: 6 mg via INTRAVENOUS
  Filled 2021-05-17: qty 2

## 2021-05-17 MED ORDER — ONDANSETRON HCL 4 MG/2ML IJ SOLN: 4.0000 mg | Freq: Four times a day (QID) | INTRAMUSCULAR | Status: DC | PRN

## 2021-05-17 MED ORDER — FLUTICASONE FUROATE-VILANTEROL 100-25 MCG/ACT IN AEPB
1.0000 | INHALATION_SPRAY | Freq: Every day | RESPIRATORY_TRACT | Status: DC
Start: 2021-05-18 — End: 2021-05-21
  Administered 2021-05-18 – 2021-05-21 (×4): 1 via RESPIRATORY_TRACT
  Filled 2021-05-17: qty 28

## 2021-05-17 MED ORDER — ACETAMINOPHEN 325 MG PO TABS
650.0000 mg | ORAL_TABLET | Freq: Four times a day (QID) | ORAL | Status: DC | PRN
Start: 2021-05-17 — End: 2021-05-21

## 2021-05-17 MED ORDER — METHYLPREDNISOLONE SODIUM SUCC 125 MG IJ SOLR
60.0000 mg | Freq: Once | INTRAMUSCULAR | Status: AC
Start: 2021-05-17 — End: 2021-05-17
  Administered 2021-05-17: 60 mg via INTRAVENOUS
  Filled 2021-05-17: qty 2

## 2021-05-17 MED ORDER — PREDNISONE 20 MG PO TABS: 40.0000 mg | ORAL_TABLET | Freq: Every day | ORAL | Status: DC

## 2021-05-17 MED ORDER — ACETAMINOPHEN 650 MG RE SUPP: 650.0000 mg | Freq: Four times a day (QID) | RECTAL | Status: DC | PRN

## 2021-05-17 MED ORDER — MIRABEGRON ER 25 MG PO TB24
25.0000 mg | ORAL_TABLET | Freq: Every day | ORAL | Status: DC
  Administered 2021-05-17 – 2021-05-21 (×5): 25 mg via ORAL
  Filled 2021-05-17 (×5): qty 1

## 2021-05-17 MED ORDER — GABAPENTIN 100 MG PO CAPS
100.0000 mg | ORAL_CAPSULE | Freq: Three times a day (TID) | ORAL | Status: DC
  Administered 2021-05-17 – 2021-05-21 (×13): 100 mg via ORAL
  Filled 2021-05-17 (×13): qty 1

## 2021-05-17 MED ORDER — ADULT MULTIVITAMIN W/MINERALS CH
1.0000 | ORAL_TABLET | ORAL | Status: DC
  Administered 2021-05-17 – 2021-05-21 (×5): 1 via ORAL
  Filled 2021-05-17 (×5): qty 1

## 2021-05-17 MED ORDER — METHOCARBAMOL 500 MG PO TABS
500.0000 mg | ORAL_TABLET | Freq: Three times a day (TID) | ORAL | Status: DC | PRN
  Filled 2021-05-17: qty 1

## 2021-05-17 MED ORDER — FUROSEMIDE 20 MG PO TABS
20.0000 mg | ORAL_TABLET | Freq: Every day | ORAL | Status: DC
  Administered 2021-05-17 – 2021-05-19 (×3): 20 mg via ORAL
  Filled 2021-05-17 (×3): qty 1

## 2021-05-17 MED ORDER — VITAMIN D 25 MCG (1000 UNIT) PO TABS
1000.0000 [IU] | ORAL_TABLET | Freq: Every day | ORAL | Status: DC
  Administered 2021-05-17 – 2021-05-21 (×5): 1000 [IU] via ORAL
  Filled 2021-05-17 (×5): qty 1

## 2021-05-17 MED ORDER — DOXYCYCLINE HYCLATE 100 MG PO TABS
100.0000 mg | ORAL_TABLET | Freq: Two times a day (BID) | ORAL | Status: DC
Start: 2021-05-17 — End: 2021-05-21
  Administered 2021-05-17 – 2021-05-21 (×9): 100 mg via ORAL
  Filled 2021-05-17 (×9): qty 1

## 2021-05-17 MED ORDER — FAMOTIDINE 20 MG PO TABS
20.0000 mg | ORAL_TABLET | Freq: Every day | ORAL | Status: DC
  Administered 2021-05-17 – 2021-05-21 (×5): 20 mg via ORAL
  Filled 2021-05-17 (×5): qty 1

## 2021-05-17 MED ORDER — ADENOSINE 6 MG/2ML IV SOLN: 6.0000 mg | Freq: Once | INTRAVENOUS | Status: AC

## 2021-05-17 MED ORDER — METHYLPREDNISOLONE SODIUM SUCC 40 MG IJ SOLR: 40.0000 mg | Freq: Two times a day (BID) | INTRAMUSCULAR | Status: DC

## 2021-05-17 MED ORDER — DILTIAZEM HCL 25 MG/5ML IV SOLN
20.0000 mg | Freq: Once | INTRAVENOUS | Status: AC
  Administered 2021-05-17: 20 mg via INTRAVENOUS
  Filled 2021-05-17: qty 5

## 2021-05-17 NOTE — Assessment & Plan Note (Signed)
Obesity with a BMI of 38.05. ?

## 2021-05-17 NOTE — Assessment & Plan Note (Addendum)
Sliding scale insulin coverage.  Continue low-dose Semglee insulin with sugars elevated secondary to steroids.  Previous hemoglobin A1c a few months ago 6.4. ?

## 2021-05-17 NOTE — ED Notes (Signed)
Pt set up for breakfast, family in room.  ?Pt with bilateral 4+ pitting ankle edema. Pt states her ankles are usually somewhat swollen.  ?

## 2021-05-17 NOTE — Assessment & Plan Note (Addendum)
Continue Zoloft 

## 2021-05-17 NOTE — ED Notes (Signed)
Bg [phone] ?

## 2021-05-17 NOTE — ED Triage Notes (Signed)
Pt presents via POV with SOB for 2-3 days. Pt wheezing upon arrival with labored breathing. Hx of COPD. She notes spitting up green mucus with a congested cough. Denies CP. ?

## 2021-05-17 NOTE — H&P (Signed)
?History and Physical  ? ? ?Patient: Jenna [name] WUJ:[id number] DOB: 1935-06-22 ?DOA: 05/17/2021 ?DOS: the patient was seen and examined on 05/17/2021 ?PCP: Olin Hauser, DO  ?Patient coming from: Home ? ?Chief Complaint:  ?Chief Complaint  ?Patient presents with  ? Shortness of Breath  ? ? ?HPI: Jenna [name] is a 86 y.o. female with medical history significant for COPD, non-insulin-dependent type 2 diabetes, chronic lower extremity edema, obesity with history of gastric bypass, HTN, ex-smoker, paroxysmal tachycardia on scheduled and as needed metoprolol, followed by [facility] cardiologist, Dr. Rockey Situ, last seen 04/02/2021 (note reviewed), who presents to the ED with a 2-day history of shortness of breath and congested cough, not improving after several DuoNeb treatments.  Her husband at the bedside who contributes to history states that he gave her up to 7 breathing treatments today, each with 2 vials of albuterol but without significant improvement.  She had no fever or chills or complaints of chest pain. ?ED course and data review: Patient was placed on BiPAP for comfort.  Vitals notable for heart rate up to the 150s, with respirations to the 30s with O2 sat 96 to 97% on BiPAP.  BP 161/79.  VBG with pH 7.44 and PCO2 41.  Troponin 9, BNP 72.  WBC 12,000 with CBC and CMP otherwise unremarkable except for slightly elevated glucose of 205.  COVID and flu negative.  EKG, personally viewed and interpreted: Supraventricular tachycardia at 157 with nonspecific ST-T wave changes.  Chest x-ray showed borderline heart size with mild vascular congestion. ?Patient was initially treated with DuoNebs and methylprednisolone.  She was noted to have persistently elevated heart rate in the 150s which was treated with diltiazem 20 mg bolus, with inadequate response.  She was subsequently trialed with repeated doses of adenosine and was subsequently placed on a diltiazem infusion. ?Hospitalist subsequently consulted for  admission.  ? ?Review of Systems  ?Unable to perform ROS: Medical condition  ? ?Past Medical History:  ?Diagnosis Date  ? Anxiety   ? Arthritis   ? neck, knees(before replacements)  ? Asthma   ? CKD (chronic kidney disease), stage III ([place])   ? COPD (chronic obstructive pulmonary disease) ([place])   ? Encounter for colonoscopy due to history of adenomatous colonic polyps   ? GERD (gastroesophageal reflux disease)   ? Hearing loss of both ears   ? History of echocardiogram   ? a. 10/2013 Echo: EF 60-65%, no rwma, midlly dil LA. Rnl RV fxn.  ? Hyperlipidemia   ? Osteoporosis   ? Palpitations   ? PSVT (paroxysmal supraventricular tachycardia) ([place])   ? a. 05/2018 [facility]: 99 SVT runs. Fastest 218 x 4:30. Longest 4:38 w/ rate of 172.  ? Sleep apnea   ? resolved with gastric bypass  ? Syncope and collapse   ? Urine incontinence   ? ?Past Surgical History:  ?Procedure Laterality Date  ? BROW LIFT Bilateral 06/27/2015  ? Procedure: BLEPHAROPLASTY BILATERAL UPPER EYELIDS BILATERAL BLEPHAROTOSIS EYELIDS;  Surgeon: Karle Starch, MD;  Location: [facility];  Service: Ophthalmology;  Laterality: Bilateral;  BILATERAL  ? CATARACT EXTRACTION W/PHACO Right 02/13/2015  ? Procedure: CATARACT EXTRACTION PHACO AND INTRAOCULAR LENS PLACEMENT (IOC);  Surgeon: Ronnell Freshwater, MD;  Location: [facility];  Service: Ophthalmology;  Laterality: Right;  ? CATARACT EXTRACTION W/PHACO Left 03/22/2015  ? Procedure: CATARACT EXTRACTION PHACO AND INTRAOCULAR LENS PLACEMENT (IOC);  Surgeon: Ronnell Freshwater, MD;  Location: [facility];  Service: Ophthalmology;  Laterality: Left;  TORIC  ? CHOLECYSTECTOMY  1984  ? COLECTOMY    ? COSMETIC SURGERY  2012  ? tummy tuck and excess skin removal  ? GASTRIC BYPASS  2010  ? REPLACEMENT TOTAL KNEE BILATERAL  1998  ? SHOULDER SURGERY  2009  ? left   ? TONSILLECTOMY    ? TOTAL ABDOMINAL HYSTERECTOMY  1979  ? ?Social History:  reports that she quit smoking about 39 years ago. Her  smoking use included cigarettes. She has a 20.00 pack-year smoking history. She has quit using smokeless tobacco. She reports that she does not currently use alcohol. She reports that she does not use drugs. ? ?Allergies  ?Allergen Reactions  ? Azithromycin Other (See Comments)  ?  Causes stomach burning pains  ? Hydromorphone Other (See Comments)  ?  confusion, personality change  ? Morphine Other (See Comments)  ?  Goes crazy  ? Nsaids Other (See Comments)  ?  Avoids because of gastric bypass surgery  ? Oxycodone-Acetaminophen Nausea And Vomiting  ? Codeine Other (See Comments) and Nausea And Vomiting  ?  "will not stay down"  ? Nabumetone Rash  ? Oxycodone Nausea And Vomiting  ? Oxycodone-Acetaminophen Nausea And Vomiting  ? Promethazine Hcl Rash and Other (See Comments)  ? ? ?Family History  ?Problem Relation Age of Onset  ? Heart attack Mother   ? Hypertension Mother   ? Diabetes type II Mother   ? Pneumonia Father   ? Skin cancer Father   ? Diabetes type II Father   ? Colon cancer Sister   ? Ovarian cancer Sister   ? Heart attack Brother   ? Heart attack Sister 28  ? Hyperlipidemia Sister   ? Hypertension Sister   ? ? ?Prior to Admission medications   ?Medication Sig Start Date End Date Taking? Authorizing Provider  ?albuterol (PROVENTIL) (2.5 MG/3ML) 0.083% nebulizer solution Take 3 mLs (2.5 mg total) by nebulization every 4 (four) hours as needed for wheezing or shortness of breath. 08/09/20 08/09/21 Yes Karamalegos, Devonne Doughty, DO  ?albuterol (VENTOLIN HFA) 108 (90 Base) MCG/ACT inhaler Inhale 2 puffs into the lungs every 6 (six) hours as needed for wheezing or shortness of breath. 02/12/21 05/17/21 Yes Karamalegos, Devonne Doughty, DO  ?Ascorbic Acid (VITAMIN C) 1000 MG tablet Take 500 mg by mouth daily.    Yes [provider]  ?Cholecalciferol 25 MCG (1000 UT) tablet Take 1,000 Units by mouth daily.    Yes [provider]  ?Cyanocobalamin 1000 MCG TBCR Take 1,000 mcg by mouth daily.    Yes  [provider]  ?famotidine (PEPCID) 20 MG tablet Take 20 mg by mouth 2 (two) times daily.   Yes [provider]  ?ferrous sulfate 325 (65 FE) MG EC tablet Take 325 mg by mouth at bedtime.    Yes [provider]  ?furosemide (LASIX) 20 MG tablet Take 1 tablet (20 mg total) by mouth daily as needed for fluid or edema. Take lasix daily, with extra dose as needed for swelling 05/30/20  Yes Gollan, Kathlene November, MD  ?gabapentin (NEURONTIN) 100 MG capsule Start 1 capsule daily, increase by 1 cap every 2-3 days as tolerated up to 3 times a day, or may take 3 at once in evening. 04/06/21  Yes Karamalegos, Devonne Doughty, DO  ?methocarbamol (ROBAXIN) 500 MG tablet Take 1 tablet (500 mg total) by mouth every 8 (eight) hours as needed for muscle spasms. 04/06/21  Yes Karamalegos, Devonne Doughty, DO  ?metoprolol succinate (TOPROL-XL) 25  MG 24 hr tablet TAKE ONE TABLET EVERY DAY MAY TAKE AN ADDITIONAL 1/2-1 TABLET AS NEEDED FOR PALPITATIONS 05/11/21  Yes Gollan, Kathlene November, MD  ?Multiple Vitamins-Minerals (ONE-A-DAY WOMENS PETITES) TABS Take 2 tablets by mouth every morning.   Yes [provider]  ?MYRBETRIQ 25 MG TB24 tablet Take 1 tablet (25 mg total) by mouth daily. 02/27/21  Yes Olin Hauser, DO  ?Omega-3 Fatty Acids (FISH OIL) 1000 MG CAPS Take 1,000 mg by mouth daily.   Yes [provider]  ?sertraline (ZOLOFT) 50 MG tablet Take 1 tablet (50 mg total) by mouth daily. 04/06/21  Yes Karamalegos, Devonne Doughty, DO  ?simvastatin (ZOCOR) 40 MG tablet Take 1 tablet (40 mg total) by mouth daily. 05/11/21  Yes Karamalegos, Devonne Doughty, DO  ?terbinafine (LAMISIL) 250 MG tablet Take 1 tablet (250 mg total) by mouth daily. For 1-3 months or until resolved. 12/21/20  Yes Karamalegos, Devonne Doughty, DO  ?traZODone (DESYREL) 100 MG tablet Take 1 tablet (100 mg total) by mouth at bedtime. 02/12/21  Yes Karamalegos, Alexander J, DO  ?TRELEGY ELLIPTA 100-62.5-25 MCG/INH AEPB Inhale 1 puff into the lungs  daily. 07/31/20  Yes [provider]  ? ? ?Physical Exam: ?Vitals:  ? 05/17/21 0345 05/17/21 0400 05/17/21 0402 05/17/21 0415  ?BP: 122/66 (!) 126/52  (!) 129/57  ?Pulse: (!) 124 (!) 154 (!) 116 (!) 132

## 2021-05-17 NOTE — Assessment & Plan Note (Addendum)
Continue famotidine.  Add Protonix just in case this is contributing to the upper airway wheeze ?

## 2021-05-17 NOTE — Progress Notes (Signed)
Anticoagulation monitoring(Lovenox): ? ?86 yo female ordered Lovenox 40 mg Q24h ?   ?Filed Weights  ? 05/17/21 0312  ?Weight: 97.4 kg (214 lb 12.8 oz)  ? ?BMI 38  ? ?Lab Results  ?Component Value Date  ? CREATININE 0.96 05/17/2021  ? CREATININE 1.08 (H) 03/01/2021  ? CREATININE 1.00 (H) 02/05/2021  ? ?Estimated Creatinine Clearance: 49.3 mL/min (by C-G formula based on SCr of 0.96 mg/dL). ?Hemoglobin & Hematocrit  ?   ?Component Value Date/Time  ? HGB 12.2 05/17/2021 0218  ? HGB 11.8 (L) 03/29/2012 0416  ? HCT 37.3 05/17/2021 0218  ? HCT 35.5 03/29/2012 0416  ? ? ? ?Per Protocol for Patient with estCrcl > 30 ml/min and BMI > 30, will transition to Lovenox 47.5 mg Q24h.  ?  ? ? ?

## 2021-05-17 NOTE — Progress Notes (Signed)
Admission profile updated. ?

## 2021-05-17 NOTE — Assessment & Plan Note (Addendum)
The patient had acute respiratory distress on presentation required initial BiPAP for tachypnea.  Continue Xopenex nebulizer solution.  Continue Trelegy.  Upper airway wheezing today with sitting up and exertion. Appreciate speech evaluation.  Continue Diflucan.  Continue doxycycline.  Continue Solu-Medrol. ?

## 2021-05-17 NOTE — Assessment & Plan Note (Addendum)
Heart rate good.  Continue Cardizem CD. ?

## 2021-05-17 NOTE — Assessment & Plan Note (Deleted)
Patient has a history of paroxysmal SVT on metoprolol daily as well as as needed ?Possible A-fib/flutter seen after rate slowing ?Continue diltiazem infusion started from the ED ?Cardiology consult to assist with rhythm identification and further recommendations ?

## 2021-05-17 NOTE — Assessment & Plan Note (Deleted)
No acute complications suspected ?

## 2021-05-17 NOTE — Consult Note (Signed)
?Cardiology Consultation:  ? ?Patient ID: Jenna [name] ?MRN: [id number]; DOB: [birth year] ? ?Admit date: 05/17/2021 ?Date of Consult: 05/17/2021 ? ?PCP:  Olin Hauser, DO ?  ?[facility] Providers ?Cardiologist:  Ida Rogue, MD   { ? ?Patient Profile:  ? ?Jenna [name] is a 86 y.o. female with a hx of COPD, DM2, chronic LLE, obesity s/p gastric bypass, HTN, prior smoker, paroxysmal tachycardia, CKD stage 3 who is being seen 05/17/2021 for the evaluation of tachyarrhythmia at the request of Dr. Earleen Newport. ? ?History of Present Illness:  ? ?[name] is followed by Dr. Rockey Situ for the above cardiac issues. Heart monitor May 2020 showed NSR with SVT runs lasting up to 4 minutes 30 seconds, max rate of 210bpm, and average rate of 172bpm. She was maintained on metoprolol. ? ?Hospitalized 04/2020 for COPD exacerbation. Echo at that time showed LVEF 60-65%, normal RV function, small pericardial effusion. ? ?Hospitalized 02/2021 for COPD exacerbation and acute bronchitis, treated with steroids, nebulizers, and antibiotics.  ? ?Last seen 04/02/21 and was overall doing well. She was taking lasix '20mg'$  BID. Higher metoprolol dose '25mg'$  was recommended for palpitations.  ? ?The patient presented to the ER 05/17/21 for shortness of breath and cough. Breathing has been worsening the last few days. No chest pain. No recent fever, chills, LLE, orthopnea, pnd. Reported husband have her several nebulizer treatments without improvement.  ? ?In the ER BP 129/57, pulse 132, RR 19. Patient was intermittently required BIPAP. Labs showed BG 205, WBC 12.3, potassium 3.8, Scr 0.96, BUN 10. CXR showed possible pulmonary venous congestion. Heart rate noted to be up to 150s, possible SVT. She was given adenosine x6 and started on IV dilt. She was given Duonebs and steroids. She was transitioned to 3L [place].  She was admitted for further work-up.  ? ?Upon review of tele, does not appear to be afib or aflutter. Narrow complex regular  rhythm, suspect SVT. Rates have improved on IV dilt and she is now SR in the 90s.  ? ? ?Past Medical History:  ?Diagnosis Date  ? Anxiety   ? Arthritis   ? neck, knees(before replacements)  ? Asthma   ? CKD (chronic kidney disease), stage III ([place])   ? COPD (chronic obstructive pulmonary disease) ([place])   ? Encounter for colonoscopy due to history of adenomatous colonic polyps   ? GERD (gastroesophageal reflux disease)   ? Hearing loss of both ears   ? History of echocardiogram   ? a. 10/2013 Echo: EF 60-65%, no rwma, midlly dil LA. Rnl RV fxn.  ? Hyperlipidemia   ? Osteoporosis   ? Palpitations   ? PSVT (paroxysmal supraventricular tachycardia) ([place])   ? a. 05/2018 Zio: 99 SVT runs. Fastest 218 x 4:30. Longest 4:38 w/ rate of 172.  ? Sleep apnea   ? resolved with gastric bypass  ? Syncope and collapse   ? Urine incontinence   ? ? ?Past Surgical History:  ?Procedure Laterality Date  ? BROW LIFT Bilateral 06/27/2015  ? Procedure: BLEPHAROPLASTY BILATERAL UPPER EYELIDS BILATERAL BLEPHAROTOSIS EYELIDS;  Surgeon: Karle Starch, MD;  Location: [facility];  Service: Ophthalmology;  Laterality: Bilateral;  BILATERAL  ? CATARACT EXTRACTION W/PHACO Right 02/13/2015  ? Procedure: CATARACT EXTRACTION PHACO AND INTRAOCULAR LENS PLACEMENT (IOC);  Surgeon: Ronnell Freshwater, MD;  Location: [facility];  Service: Ophthalmology;  Laterality: Right;  ? CATARACT EXTRACTION W/PHACO Left 03/22/2015  ? Procedure: CATARACT EXTRACTION PHACO AND INTRAOCULAR LENS PLACEMENT (IOC);  Surgeon: Ronnell Freshwater, MD;  Location: [facility];  Service: Ophthalmology;  Laterality: Left;  TORIC  ? CHOLECYSTECTOMY  1984  ? COLECTOMY    ? COSMETIC SURGERY  2012  ? tummy tuck and excess skin removal  ? GASTRIC BYPASS  2010  ? REPLACEMENT TOTAL KNEE BILATERAL  1998  ? SHOULDER SURGERY  2009  ? left   ? TONSILLECTOMY    ? TOTAL ABDOMINAL HYSTERECTOMY  1979  ?  ? ?Home Medications:  ?Prior to Admission medications    ?Medication Sig Start Date End Date Taking? Authorizing Provider  ?albuterol (PROVENTIL) (2.5 MG/3ML) 0.083% nebulizer solution Take 3 mLs (2.5 mg total) by nebulization every 4 (four) hours as needed for wheezing or shortness of breath. 08/09/20 08/09/21 Yes Karamalegos, Devonne Doughty, DO  ?albuterol (VENTOLIN HFA) 108 (90 Base) MCG/ACT inhaler Inhale 2 puffs into the lungs every 6 (six) hours as needed for wheezing or shortness of breath. 02/12/21 05/17/21 Yes Karamalegos, Devonne Doughty, DO  ?Ascorbic Acid (VITAMIN C) 1000 MG tablet Take 500 mg by mouth daily.    Yes [provider]  ?Cholecalciferol 25 MCG (1000 UT) tablet Take 1,000 Units by mouth daily.    Yes [provider]  ?Cyanocobalamin 1000 MCG TBCR Take 1,000 mcg by mouth daily.    Yes [provider]  ?famotidine (PEPCID) 20 MG tablet Take 20 mg by mouth 2 (two) times daily.   Yes [provider]  ?ferrous sulfate 325 (65 FE) MG EC tablet Take 325 mg by mouth at bedtime.    Yes [provider]  ?furosemide (LASIX) 20 MG tablet Take 1 tablet (20 mg total) by mouth daily as needed for fluid or edema. Take lasix daily, with extra dose as needed for swelling 05/30/20  Yes Gollan, Kathlene November, MD  ?gabapentin (NEURONTIN) 100 MG capsule Start 1 capsule daily, increase by 1 cap every 2-3 days as tolerated up to 3 times a day, or may take 3 at once in evening. 04/06/21  Yes Karamalegos, Devonne Doughty, DO  ?methocarbamol (ROBAXIN) 500 MG tablet Take 1 tablet (500 mg total) by mouth every 8 (eight) hours as needed for muscle spasms. 04/06/21  Yes Karamalegos, Devonne Doughty, DO  ?metoprolol succinate (TOPROL-XL) 25 MG 24 hr tablet TAKE ONE TABLET EVERY DAY MAY TAKE AN ADDITIONAL 1/2-1 TABLET AS NEEDED FOR PALPITATIONS 05/11/21  Yes Gollan, Kathlene November, MD  ?Multiple Vitamins-Minerals (ONE-A-DAY WOMENS PETITES) TABS Take 2 tablets by mouth every morning.   Yes [provider]  ?MYRBETRIQ 25 MG TB24 tablet Take 1 tablet (25 mg  total) by mouth daily. 02/27/21  Yes Olin Hauser, DO  ?Omega-3 Fatty Acids (FISH OIL) 1000 MG CAPS Take 1,000 mg by mouth daily.   Yes [provider]  ?sertraline (ZOLOFT) 50 MG tablet Take 1 tablet (50 mg total) by mouth daily. 04/06/21  Yes Karamalegos, Devonne Doughty, DO  ?simvastatin (ZOCOR) 40 MG tablet Take 1 tablet (40 mg total) by mouth daily. 05/11/21  Yes Karamalegos, Devonne Doughty, DO  ?terbinafine (LAMISIL) 250 MG tablet Take 1 tablet (250 mg total) by mouth daily. For 1-3 months or until resolved. 12/21/20  Yes Karamalegos, Devonne Doughty, DO  ?traZODone (DESYREL) 100 MG tablet Take 1 tablet (100 mg total) by mouth at bedtime. 02/12/21  Yes Karamalegos, Alexander J, DO  ?TRELEGY ELLIPTA 100-62.5-25 MCG/INH AEPB Inhale 1 puff into the lungs daily. 07/31/20  Yes [provider]  ? ? ?Inpatient Medications: ?Scheduled Meds: ? vitamin C  500 mg Oral Daily  ? cholecalciferol  1,000 Units Oral Daily  ? enoxaparin (LOVENOX) injection  0.5 mg/kg Subcutaneous Q24H  ? famotidine  20 mg Oral Daily  ? ferrous sulfate  325 mg Oral QHS  ? [START ON 05/18/2021] fluticasone furoate-vilanterol  1 puff Inhalation Daily  ? And  ? [START ON 05/18/2021] umeclidinium bromide  1 puff Inhalation Daily  ? gabapentin  100 mg Oral TID  ? insulin aspart  0-15 Units Subcutaneous TID WC  ? insulin aspart  0-5 Units Subcutaneous QHS  ? levalbuterol  1.25 mg Nebulization Q6H  ? methylPREDNISolone (SOLU-MEDROL) injection  40 mg Intravenous Q12H  ? Followed by  ? [START ON 05/18/2021] predniSONE  40 mg Oral Q breakfast  ? metoprolol succinate  50 mg Oral Daily  ? mirabegron ER  25 mg Oral Daily  ? multivitamin with minerals  1 tablet Oral BH-q7a  ? sertraline  50 mg Oral Daily  ? traZODone  100 mg Oral QHS  ? vitamin B-12  1,000 mcg Oral Daily  ? ?Continuous Infusions: ? diltiazem (CARDIZEM) infusion 10 mg/hr (05/17/21 0738)  ? ?PRN Meds: ?acetaminophen **OR** acetaminophen, levalbuterol, methocarbamol, ondansetron **OR**  ondansetron (ZOFRAN) IV ? ?Allergies:    ?Allergies  ?Allergen Reactions  ? Azithromycin Other (See Comments)  ?  Causes stomach burning pains  ? Hydromorphone Other (See Comments)  ?  confusion, personality change

## 2021-05-17 NOTE — Progress Notes (Signed)
Inpatient Diabetes Program Recommendations ? ?AACE/ADA: New Consensus Statement on Inpatient Glycemic Control (2015) ? ?Target Ranges:  Prepandial:   less than 140 mg/dL ?     Peak postprandial:   less than 180 mg/dL (1-2 hours) ?     Critically ill patients:  140 - 180 mg/dL  ? ?Lab Results  ?Component Value Date  ? GLUCAP 289 (H) 05/17/2021  ? HGBA1C 6.4 (H) 02/05/2021  ? ? ?Review of Glycemic Control ? Latest Reference Range & Units 05/17/21 07:42  ?Glucose-Capillary 70 - 99 mg/dL 289 (H)  ?(H): Data is abnormally high ?Diabetes history: Type 2 DM ?Outpatient Diabetes medications: none ?Current orders for Inpatient glycemic control: Novolog 0-15 units TID & 0-5 QHS ?Solumedrol 40 mg BID ? ?Inpatient Diabetes Program Recommendations:   ? ?In the setting of steroids, consider adding Levemir 12 units QD and Novolog 3 units TID (Assuming patient is consuming >50% of meals).  ? ?Thanks, ?Bronson Curb, MSN, RNC-OB ?Diabetes Coordinator ?[phone] (8a-5p) ? ?

## 2021-05-17 NOTE — ED Provider Notes (Signed)
? ?[facility] ?Provider Note ? ? ? Event Date/Time  ? First MD Initiated Contact with Patient 05/17/21 0220   ?  (approximate) ? ? ?History  ? ?Shortness of Breath ? ? ?HPI ? ?Jenna [name] is a 86 y.o. female with past medical history of COPD, history of SVT, sleep apnea, GERD who presents with shortness of breath.  Patient notes that she has been feeling short of breath for several days this seemed to worsen tonight.  She denies chest pain.  Does have cough productive of green sputum.  Denies fevers chills chest pain.  No lower extremity edema is chronic.  Patient's history is limited by significant distress on arrival. ?  ? ?Past Medical History:  ?Diagnosis Date  ? Anxiety   ? Arthritis   ? neck, knees(before replacements)  ? Asthma   ? CKD (chronic kidney disease), stage III ([place])   ? COPD (chronic obstructive pulmonary disease) ([place])   ? Encounter for colonoscopy due to history of adenomatous colonic polyps   ? GERD (gastroesophageal reflux disease)   ? Hearing loss of both ears   ? History of echocardiogram   ? a. 10/2013 Echo: EF 60-65%, no rwma, midlly dil LA. Rnl RV fxn.  ? Hyperlipidemia   ? Osteoporosis   ? Palpitations   ? PSVT (paroxysmal supraventricular tachycardia) ([place])   ? a. 05/2018 Zio: 99 SVT runs. Fastest 218 x 4:30. Longest 4:38 w/ rate of 172.  ? Sleep apnea   ? resolved with gastric bypass  ? Syncope and collapse   ? Urine incontinence   ? ? ?Patient Active Problem List  ? Diagnosis Date Noted  ? History of depression   ? COPD with acute exacerbation ([place]) 03/01/2021  ? History of gastric bypass 04/22/2020  ? Age-related osteoporosis without current pathological fracture 06/03/2019  ? PSVT (paroxysmal supraventricular tachycardia) ([place] [employer] [place]) 02/01/2019  ? Mild nonproliferative diabetic retinopathy of left eye with macular edema ([place]) 10/13/2018  ? Mild nonproliferative diabetic retinopathy of right eye without macular edema ([place]) 10/13/2018  ? Controlled type 2 diabetes  mellitus without complication, without long-term current use of insulin ([place]) 11/13/2017  ? Hyperlipidemia associated with type 2 diabetes mellitus ([place]) 11/13/2017  ? Depression, major, recurrent ([place]) 11/12/2017  ? Psychophysiological insomnia 11/12/2017  ? Primary osteoarthritis of both hands 11/12/2017  ? Primary osteoarthritis involving multiple joints 11/12/2017  ? Obesity (BMI 35.0-39.9 without comorbidity) 11/13/2013  ? GERD (gastroesophageal reflux disease) 11/13/2013  ? Bilateral leg edema 11/13/2013  ? Chronic cough 11/13/2013  ? Hx of adenomatous colonic polyps 10/15/2013  ? ? ? ?Physical Exam  ?Triage Vital Signs: ?ED Triage Vitals  ?Enc Vitals Group  ?   BP 05/17/21 0216 (!) 161/79  ?   Pulse Rate 05/17/21 0216 (!) 151  ?   Resp 05/17/21 0216 (!) 30  ?   Temp 05/17/21 0216 98.2 ?F (36.8 ?C)  ?   Temp src --   ?   SpO2 05/17/21 0216 96 %  ?   Weight --   ?   Height --   ?   Head Circumference --   ?   Peak Flow --   ?   Pain Score 05/17/21 0218 0  ?   Pain Loc --   ?   Pain Edu? --   ?   Excl. in [place]? --   ? ? ?Most recent vital signs: ?Vitals:  ? 05/17/21 0402 05/17/21 0415  ?BP:  (!) 129/57  ?  Pulse: (!) 116 (!) 132  ?Resp: 20 19  ?Temp:    ?SpO2: 95% 93%  ? ? ? ?General: Awake, patient is in respiratory distress ?CV:  Good peripheral perfusion.  2+ edema bilaterally, symmetric ?Resp:  Patient is tachypneic, using accessory muscles with prolonged expiratory phase, wheezing throughout ?Abd:  No distention.  ?Neuro:             Awake, Alert, Oriented x 3  ?Other:   ? ? ?ED Results / Procedures / Treatments  ?Labs ?(all labs ordered are listed, but only abnormal results are displayed) ?Labs Reviewed  ?COMPREHENSIVE METABOLIC PANEL - Abnormal; Notable for the following components:  ?    Result Value  ? Glucose, Bld 205 (*)   ? GFR, Estimated 59 (*)   ? All other components within normal limits  ?CBC WITH DIFFERENTIAL/PLATELET - Abnormal; Notable for the following components:  ? WBC 12.3 (*)   ? RBC 3.79 (*)    ? Neutro Abs 9.1 (*)   ? Monocytes Absolute 1.2 (*)   ? All other components within normal limits  ?BLOOD GAS, VENOUS - Abnormal; Notable for the following components:  ? pH, Ven 7.44 (*)   ? pCO2, Ven 41 (*)   ? pO2, Ven 74 (*)   ? Acid-Base Excess 3.3 (*)   ? All other components within normal limits  ?RESP PANEL BY RT-PCR (FLU A&B, COVID) ARPGX2  ?BRAIN NATRIURETIC PEPTIDE  ?TROPONIN I (HIGH SENSITIVITY)  ?TROPONIN I (HIGH SENSITIVITY)  ? ? ? ?EKG ? ?EKG interpreted by myself shows a narrow complex tachycardic rhythm that is regular with some ST depression in the inferior and lateral precordial leads ? ?RADIOLOGY ?Chest x-ray reviewed by myself shows cardiomegaly possible pulmonary venous congestion ? ? ?PROCEDURES: ? ?Critical Care performed: Yes, see critical care procedure note(s) ? ?.1-3 Lead EKG Interpretation ?Performed by: Rada Hay, MD ?Authorized by: Rada Hay, MD  ? ?  Interpretation: abnormal   ?  ECG rate assessment: tachycardic   ?  Rhythm: SVT   ?  Ectopy: none   ?  Conduction: normal   ? ?The patient is on the cardiac monitor to evaluate for evidence of arrhythmia and/or significant heart rate changes. ? ? ?MEDICATIONS ORDERED IN ED: ?Medications  ?diltiazem (CARDIZEM) 125 mg in dextrose 5% 125 mL (1 mg/mL) infusion (5 mg/hr Intravenous New Bag/Given 05/17/21 0358)  ?ipratropium-albuterol (DUONEB) 0.5-2.5 (3) MG/3ML nebulizer solution 3 mL (3 mLs Nebulization Given 05/17/21 0230)  ?ipratropium-albuterol (DUONEB) 0.5-2.5 (3) MG/3ML nebulizer solution 3 mL (3 mLs Nebulization Given 05/17/21 0230)  ?ipratropium-albuterol (DUONEB) 0.5-2.5 (3) MG/3ML nebulizer solution 3 mL (3 mLs Nebulization Given 05/17/21 0229)  ?methylPREDNISolone sodium succinate (SOLU-MEDROL) 125 mg/2 mL injection 60 mg (60 mg Intravenous Given 05/17/21 0231)  ?ondansetron (ZOFRAN) 4 MG/2ML injection (4 mg  Given 05/17/21 0230)  ?diltiazem (CARDIZEM) injection 20 mg (20 mg Intravenous Given 05/17/21 0252)  ?adenosine  (ADENOCARD) 6 MG/2ML injection 6 mg (6 mg Intravenous Given 05/17/21 0339)  ?adenosine (ADENOCARD) 12 MG/4ML injection (  Return to [facility] 05/17/21 0349)  ? ? ? ?IMPRESSION / MDM / ASSESSMENT AND PLAN / ED COURSE  ?I reviewed the triage vital signs and the nursing notes. ?             ?               ? ?Differential diagnosis includes, but is not limited to, exacerbation of COPD, CHF, tachydysrhythmia induced CHF, pulmonary embolism, pneumonia ? ?  Patient is a 86 year old female history of COPD not on home O2 also history of SVT who presents with shortness of breath.  Has been going on for several days has also had cough productive of green sputum.  On arrival to the ED she looks distressed with increased work of breathing prolonged expiratory phase she is wheezing.  Placed on BiPAP for work of breathing.  She also notably tachycardic.  EKG showing irregular narrow complex tachycardic rhythm around 150.  Differential includes SVT versus a flutter.  Given a bolus of Dilt with really minimal improvement in her heart rate.  Repeat EKG again showing regular rhythm heart rate in the 160s.  Will trial adenosine to see if this either breaks the SVT or slows down the rhythm enough to see flutter waves.  She was given DuoNebs and steroids.  VBG looks fairly normal.  BNP is not elevated troponin is normal.  Suspect exacerbation of COPD and possible arrhythmia in the setting of beta agonist.  ? ?Patient was given 6 of adenosine and rates briefly slowed, rhythm strip shows a lot of artifact so it is difficult to see the underlying rhythm question whether there is some flutter waves.  Patient was then started on a dill drip.  Heart rates slowing and looks to be more like A-fib now in the 130s.  I took her off BiPAP and she is resting comfortably on nasal cannula.  Discussed with the hospitalist for admission. ? ?  ? ? ?FINAL CLINICAL IMPRESSION(S) / ED DIAGNOSES  ? ?Final diagnoses:  ?Atrial flutter with rapid ventricular response  ([place])  ?COPD exacerbation ([place])  ? ? ? ?Rx / DC Orders  ? ?ED Discharge Orders   ? ? None  ? ?  ? ? ? ?Note:  This document was prepared using Dragon voice recognition software and may include unint

## 2021-05-17 NOTE — Progress Notes (Signed)
?  Progress Note ? ? ?Patient: Jenna [name] Pulse WOE:[id number] DOB: 1935-09-01 DOA: 05/17/2021     0 ?DOS: the patient was seen and examined on 05/17/2021 ?  ? ? ?Assessment and Plan: ?* COPD with acute exacerbation ([place]) ?The patient had acute respiratory distress on presentation required initial BiPAP for tachypnea.  Continue Xopenex nebulizer solution.  Continue Trelegy.  Lateral upper airway congestion and wheeze.  Add doxycycline.  Continue Solu-Medrol 40 mg IV twice daily. ? ?PSVT (paroxysmal supraventricular tachycardia) ([place]) ?We will wean Cardizem drip.  Try to start bisoprolol instead of metoprolol. ? ?History of depression ?Continue Zoloft ? ?Controlled type 2 diabetes mellitus without complication, without long-term current use of insulin ([place]) ?Sliding scale insulin coverage.  Previous hemoglobin A1c a few months ago 6.4. ? ?GERD (gastroesophageal reflux disease) ?Continue famotidine ? ?Obesity (BMI 35.0-39.9 without comorbidity) ?Obesity with a BMI of 38.05. ? ? ? ?  ? ?Subjective: Patient coming in with shortness of breath for few days.  Upper airway congestion.  Coughing up a lot of mucus.  Initially required BiPAP.  She does not wear oxygen at home. ? ?Physical Exam: ?Vitals:  ? 05/17/21 1015 05/17/21 1045 05/17/21 1115 05/17/21 1300  ?BP: (!) 119/55 (!) 115/51 (!) 122/52 (!) 120/49  ?Pulse: 78 60 71 66  ?Resp: '19 19 17 15  '$ ?Temp:      ?SpO2: 97% 96% 97% 97%  ?Weight:      ?Height:      ? ?Physical Exam ?HENT:  ?   Head: Normocephalic.  ?   Mouth/Throat:  ?   Pharynx: No oropharyngeal exudate.  ?Eyes:  ?   General: Lids are normal.  ?   Conjunctiva/sclera: Conjunctivae normal.  ?Cardiovascular:  ?   Rate and Rhythm: Normal rate and regular rhythm.  ?   Heart sounds: Normal heart sounds, S1 normal and S2 normal.  ?Pulmonary:  ?   Breath sounds: Transmitted upper airway sounds present. Examination of the right-lower field reveals decreased breath sounds. Examination of the left-lower field reveals decreased  breath sounds. Decreased breath sounds present. No wheezing, rhonchi or rales.  ?Abdominal:  ?   Palpations: Abdomen is soft.  ?   Tenderness: There is no abdominal tenderness.  ?Musculoskeletal:  ?   Right lower leg: Swelling present.  ?   Left lower leg: Swelling present.  ?   Comments: Left leg always more swollen than right.  ?Skin: ?   General: Skin is warm.  ?   Findings: No rash.  ?Neurological:  ?   Mental Status: She is alert and oriented to person, place, and time.  ?  ?Data Reviewed: ?White blood cell count 12.3, hemoglobin 12.2. ? ?Family Communication: Spoke with patient's daughter and husband at the bedside ? ?Disposition: ?Status is: Inpatient ?Remains inpatient appropriate because: Initially admitted with acute respiratory distress requiring BiPAP.  Being treated for COPD exacerbation requiring IV steroids. ? ?Planned Discharge Destination: Home ? ? ?Author: ?Loletha Grayer, MD ?05/17/2021 1:09 PM ? ?For on call review [email or web address].  ?

## 2021-05-18 ENCOUNTER — Inpatient Hospital Stay: Admit: 2021-05-18 | Payer: Medicare Other

## 2021-05-18 ENCOUNTER — Inpatient Hospital Stay (HOSPITAL_COMMUNITY)
Admit: 2021-05-18 | Discharge: 2021-05-18 | Disposition: A | Discharge status: Home / Self Care | Payer: Medicare Other | Attending: Internal Medicine | Admitting: Internal Medicine

## 2021-05-18 DIAGNOSIS — E119 Type 2 diabetes mellitus without complications: Secondary | ICD-10-CM | POA: Diagnosis not present

## 2021-05-18 DIAGNOSIS — I4891 Unspecified atrial fibrillation: Secondary | ICD-10-CM | POA: Diagnosis not present

## 2021-05-18 DIAGNOSIS — Z9884 Bariatric surgery status: Secondary | ICD-10-CM

## 2021-05-18 DIAGNOSIS — J441 Chronic obstructive pulmonary disease with (acute) exacerbation: Secondary | ICD-10-CM | POA: Diagnosis not present

## 2021-05-18 DIAGNOSIS — R000 Tachycardia, unspecified: Secondary | ICD-10-CM

## 2021-05-18 DIAGNOSIS — Z8659 Personal history of other mental and behavioral disorders: Secondary | ICD-10-CM | POA: Diagnosis not present

## 2021-05-18 DIAGNOSIS — I4892 Unspecified atrial flutter: Secondary | ICD-10-CM

## 2021-05-18 DIAGNOSIS — I471 Supraventricular tachycardia: Secondary | ICD-10-CM | POA: Diagnosis not present

## 2021-05-18 LAB — BASIC METABOLIC PANEL
Anion gap: 8 (ref 5–15)
BUN: 18 mg/dL (ref 8–23)
CO2: 27 mmol/L (ref 22–32)
Calcium: 9 mg/dL (ref 8.9–10.3)
Chloride: 99 mmol/L (ref 98–111)
Creatinine, Ser: 1.13 mg/dL — ABNORMAL HIGH (ref 0.44–1.00)
GFR, Estimated: 48 mL/min — ABNORMAL LOW (ref >60)
Glucose, Bld: 233 mg/dL — ABNORMAL HIGH (ref 70–99)
Potassium: 4.4 mmol/L (ref 3.5–5.1)
Sodium: 134 mmol/L — ABNORMAL LOW (ref 135–145)

## 2021-05-18 LAB — ECHOCARDIOGRAM COMPLETE
AR max vel: 2.96 cm2
AV Area VTI: 2.78 cm2
AV Area mean vel: 2.46 cm2
AV Mean grad: 2 mmHg
AV Peak grad: 2.9 mmHg
Ao pk vel: 0.85 m/s
Area-P 1/2: 3.83 cm2
Height: 63 in
MV VTI: 1.61 cm2
S' Lateral: 2.7 cm
Weight: 3436.81 oz

## 2021-05-18 LAB — CBC
HCT: 36.2 % (ref 36.0–46.0)
Hemoglobin: 11.9 g/dL — ABNORMAL LOW (ref 12.0–15.0)
MCH: 32.2 pg (ref 26.0–34.0)
MCHC: 32.9 g/dL (ref 30.0–36.0)
MCV: 98.1 fL (ref 80.0–100.0)
Platelets: 188 10*3/uL (ref 150–400)
RBC: 3.69 MIL/uL — ABNORMAL LOW (ref 3.87–5.11)
RDW: 12.4 % (ref 11.5–15.5)
WBC: 10.4 10*3/uL (ref 4.0–10.5)
nRBC: 0 % (ref 0.0–0.2)

## 2021-05-18 LAB — GLUCOSE, CAPILLARY
Glucose-Capillary: 243 mg/dL — ABNORMAL HIGH (ref 70–99)
Glucose-Capillary: 316 mg/dL — ABNORMAL HIGH (ref 70–99)
Glucose-Capillary: 134 mg/dL — ABNORMAL HIGH (ref 70–99)
Glucose-Capillary: 141 mg/dL — ABNORMAL HIGH (ref 70–99)
Glucose-Capillary: 143 mg/dL — ABNORMAL HIGH (ref 70–99)

## 2021-05-18 MED ORDER — INSULIN GLARGINE-YFGN 100 UNIT/ML ~~LOC~~ SOLN
10.0000 [IU] | Freq: Every day | SUBCUTANEOUS | Status: DC
  Administered 2021-05-18 – 2021-05-21 (×4): 10 [IU] via SUBCUTANEOUS
  Filled 2021-05-18 (×4): qty 0.1

## 2021-05-18 NOTE — Progress Notes (Signed)
?  Progress Note ? ? ?Patient: Jenna [name] [place] OMV:[id number] DOB: [birth year] DOA: 05/17/2021     1 ?DOS: the patient was seen and examined on 05/18/2021 ? ? ?Assessment and Plan: ?* COPD with acute exacerbation ([place]) ?The patient had acute respiratory distress on presentation required initial BiPAP for tachypnea.  Continue Xopenex nebulizer solution.  Continue Trelegy.  Upper airway congestion and wheeze improved from yesterday but still has bronchospasm when taking a deep breath.  Continue doxycycline.  Continue Solu-Medrol 40 mg IV twice daily. ? ?PSVT (paroxysmal supraventricular tachycardia) ([place]) ?Heart rate better on bisoprolol. ? ?History of depression ?Continue Zoloft ? ?Controlled type 2 diabetes mellitus without complication, without long-term current use of insulin ([place]) ?Sliding scale insulin coverage.  Add low-dose Semglee insulin with sugars elevated secondary to steroids.  Previous hemoglobin A1c a few months ago 6.4. ? ?GERD (gastroesophageal reflux disease) ?Continue famotidine ? ?Obesity (BMI 35.0-39.9 without comorbidity) ?Obesity with a BMI of 38.05. ? ? ? ? ?  ? ?Subjective: Patient feeling a lot better than when she came in.  Her upper airway congestion is better.  Still coughing quite a bit when she took a deep breath went into a coughing fit.  Admitted with acute respiratory distress and COPD exacerbation. ? ?Physical Exam: ?Vitals:  ? 05/18/21 0318 05/18/21 0752 05/18/21 0912 05/18/21 1127  ?BP: (!) 129/58  128/72 (!) 114/49  ?Pulse: 66  81 73  ?Resp: 18   17  ?Temp: 98 ?F (36.7 ?C)   97.7 ?F (36.5 ?C)  ?TempSrc:      ?SpO2: 99% 98% 99% 93%  ?Weight:      ?Height:      ? ?Physical Exam ?HENT:  ?   Head: Normocephalic.  ?   Mouth/Throat:  ?   Pharynx: No oropharyngeal exudate.  ?Eyes:  ?   General: Lids are normal.  ?   Conjunctiva/sclera: Conjunctivae normal.  ?Cardiovascular:  ?   Rate and Rhythm: Normal rate and regular rhythm.  ?   Heart sounds: Normal heart sounds, S1 normal and S2 normal.   ?Pulmonary:  ?   Breath sounds: No transmitted upper airway sounds. Examination of the right-lower field reveals decreased breath sounds and rhonchi. Examination of the left-lower field reveals decreased breath sounds and rhonchi. Decreased breath sounds and rhonchi present. No wheezing or rales.  ?   Comments: Patient coughs with a deep breath. ?Abdominal:  ?   Palpations: Abdomen is soft.  ?   Tenderness: There is no abdominal tenderness.  ?Musculoskeletal:  ?   Right lower leg: Swelling present.  ?   Left lower leg: Swelling present.  ?   Comments: Left leg always more swollen than right.  ?Skin: ?   General: Skin is warm.  ?   Findings: No lesion or rash.  ?Neurological:  ?   Mental Status: She is alert and oriented to person, place, and time.  ?  ?Data Reviewed: ?Sodium 134, glucose 316, hemoglobin 11.9, white blood cell count 10.4 ? ?Family Communication: Spoke with husband and daughter at the bedside ? ?Disposition: ?Status is: Inpatient ?Remains inpatient appropriate because: Still having bronchospasm and taking a deep breath ? ?Planned Discharge Destination: Home ? ? ?Author: ?Loletha Grayer, MD ?05/18/2021 1:56 PM ? ?For on call review [email or web address].  ?

## 2021-05-18 NOTE — Progress Notes (Signed)
Inpatient Diabetes Program Recommendations ? ?AACE/ADA: New Consensus Statement on Inpatient Glycemic Control (2015) ? ?Target Ranges:  Prepandial:   less than 140 mg/dL ?     Peak postprandial:   less than 180 mg/dL (1-2 hours) ?     Critically ill patients:  140 - 180 mg/dL  ? ? Latest Reference Range & Units 05/17/21 07:42 05/17/21 11:44 05/17/21 16:50 05/17/21 21:38  ?Glucose-Capillary 70 - 99 mg/dL 289 (H) ? ?8 units Novolog ? 292 (H) ? ?8 units Novolog ? 195 (H) ? ?3 units Novolog ? 245 (H) ? ?2 units Novolog ?  ?(H): Data is abnormally high ? Latest Reference Range & Units 05/18/21 08:37  ?Glucose-Capillary 70 - 99 mg/dL 243 (H) ? ?5 units Novolog ?  ?(H): Data is abnormally high ? ? ? ?Home DM Meds: None listed ? ?Current Orders: Novolog 0-15 units TID ac/hs ? ? ? ? ?MD- Note pt getting Solumedrol 40 mg BID. ? ?CBG 243 this AM ? ?Please consider starting Basal insulin: Semglee 10 units Daily (0.1 units/kg) ? ? ? ?--Will follow patient during hospitalization-- ? ?Wyn Quaker RN, MSN, CDE ?Diabetes Coordinator ?Inpatient Glycemic Control Team ?Team Pager: [phone] (8a-5p) ? ? ?

## 2021-05-18 NOTE — Progress Notes (Signed)
Nutrition Brief Note ? ?Patient identified on the Malnutrition Screening Tool (MST) Report ? ?Wt Readings from Last 15 Encounters:  ?05/17/21 97.4 kg  ?05/15/21 97.4 kg  ?04/06/21 96.6 kg  ?04/02/21 96.6 kg  ?03/07/21 98 kg  ?03/01/21 98 kg  ?02/12/21 98 kg  ?10/26/20 94.8 kg  ?08/15/20 100.2 kg  ?08/09/20 103.1 kg  ?07/18/20 95.3 kg  ?05/30/20 97.8 kg  ?05/08/20 94.4 kg  ?04/22/20 93 kg  ?04/20/20 93 kg  ? ?Jenna [name] is a 86 y.o. female with medical history significant for COPD, non-insulin-dependent type 2 diabetes, chronic lower extremity edema, obesity with history of gastric bypass, HTN, ex-smoker, paroxysmal tachycardia on scheduled and as needed metoprolol, followed by [facility] cardiologist, Dr. Rockey Situ, last seen 04/02/2021 (note reviewed), who presents to the ED with a 2-day history of shortness of breath and congested cough, not improving after several DuoNeb treatments.  Her husband at the bedside who contributes to history states that he gave her up to 7 breathing treatments today, each with 2 vials of albuterol but without significant improvement.  She had no fever or chills or complaints of chest pain. ? ?Pt admitted with COPD exacerbation.  ? ?Reviewed I/O's: +93 ml x 24 hours ? ?Spoke with pt at bedside, who reports feeling better today. She shares her daughter recently moved in her her and started preparing her meals. She consumes 3 meals per day (Breakfast: yogurt and coffee; Lunch: sandwich; Dinner: meat, starch, and vegetables).  ? ?Pt denies any weight loss. Reviewed wt hx; pt has experienced a 1% wt loss over the past 3 months, which is not significant for time frame.  ? ?Pt endorses history of gastric bypass surgery "a long time ago". Pt admits compliance to medications, but unsure what they are as her daughter manages her medications.  ? ?RD to order bariatric vitamins with maintenance (500 mg calcium TID and MVI BID).  ? ?Nutrition-Focused physical exam completed. Findings are no fat  depletion, no muscle depletion, and moderate edema.   ? ?Medications reviewed and include vitamin C, vitamin D3, lasix, solu-medrol, and vitamin B-12.  ? ?Labs reviewed: Na: 134, Mg: 1.4, CBGS: 195-292 (inpatient orders for glycemic control are 0-15 units insulin aspart TID with meals and 0-5 units insulin aspart daily at bedtime).   ? ?Body mass index is 38.05 kg/m?Marland Kitchen Patient meets criteria for obesity, class II based on current BMI.  ? ?Current diet order is heart healthy/ carb modified, patient is consuming approximately 100% of meals at this time. Labs and medications reviewed.  ? ?No nutrition interventions warranted at this time. If nutrition issues arise, please consult RD.  ? ?Loistine Chance, RD, LDN, CDCES ?Registered Dietitian II ?Certified Diabetes Care and Education Specialist ?Please refer to [facility] for RD and/or RD on-call/weekend/after hours pager   ?

## 2021-05-18 NOTE — Progress Notes (Addendum)
? ?Progress Note ? ?Patient Name: Jenna [name] ?Date of Encounter: 05/18/2021 ? ?[facility] Cardiologist: Ida Rogue, MD  ? ?Subjective  ? ?Ambulating on the unit today with walker ?Still with significant wheezing, shortness of breath, ?Feels little bit better compared to yesterday ?Telemetry reviewed, normal sinus rhythm rate in the 70s on bisoprolol ?Cardizem infusion held yesterday ? ?Inpatient Medications  ?  ?Scheduled Meds: ? vitamin C  500 mg Oral Daily  ? bisoprolol  5 mg Oral Daily  ? cholecalciferol  1,000 Units Oral Daily  ? doxycycline  100 mg Oral Q12H  ? enoxaparin (LOVENOX) injection  0.5 mg/kg Subcutaneous Q24H  ? famotidine  20 mg Oral Daily  ? ferrous sulfate  325 mg Oral QHS  ? fluticasone furoate-vilanterol  1 puff Inhalation Daily  ? And  ? umeclidinium bromide  1 puff Inhalation Daily  ? furosemide  20 mg Oral Daily  ? gabapentin  100 mg Oral TID  ? insulin aspart  0-15 Units Subcutaneous TID WC  ? insulin aspart  0-5 Units Subcutaneous QHS  ? levalbuterol  1.25 mg Nebulization Q6H  ? methylPREDNISolone (SOLU-MEDROL) injection  40 mg Intravenous Q12H  ? mirabegron ER  25 mg Oral Daily  ? multivitamin with minerals  1 tablet Oral BH-q7a  ? sertraline  50 mg Oral Daily  ? traZODone  100 mg Oral QHS  ? vitamin B-12  1,000 mcg Oral Daily  ? ?Continuous Infusions: ? ?PRN Meds: ?acetaminophen **OR** acetaminophen, levalbuterol, methocarbamol, ondansetron **OR** ondansetron (ZOFRAN) IV  ? ?Vital Signs  ?  ?Vitals:  ? 05/18/21 0225 05/18/21 1017 05/18/21 5102 05/18/21 0912  ?BP:  (!) 129/58  128/72  ?Pulse: 70 66  81  ?Resp:  18    ?Temp:  98 ?F (36.7 ?C)    ?TempSrc:      ?SpO2: 95% 99% 98% 99%  ?Weight:      ?Height:      ? ? ?Intake/Output Summary (Last 24 hours) at 05/18/2021 1122 ?Last data filed at 05/18/2021 5852 ?Gross per 24 hour  ?Intake 573.3 ml  ?Output --  ?Net 573.3 ml  ? ? ?  05/17/2021  ?  3:12 AM 05/15/2021  ?  1:57 PM 04/06/2021  ?  1:44 PM  ?Last 3 Weights  ?Weight (lbs) 214  lb 12.8 oz 214 lb 12.8 oz 213 lb  ?Weight (kg) 97.433 kg 97.433 kg 96.616 kg  ?   ? ?Telemetry  ?  ?Normal sinus rhythm rate in the 70s- Personally Reviewed ? ?ECG  ?  ? - Personally Reviewed ? ?Physical Exam  ? ?GEN: No acute distress.   ?Neck: No JVD ?Cardiac: RRR, no murmurs, rubs, or gallops.  ?Respiratory: Mildly decreased breath sounds, wheezing ?GI: Soft, nontender, non-distended  ?MS: No edema; No deformity. ?Neuro:  Nonfocal  ?Psych: Normal affect  ? ?Labs  ?  ?High Sensitivity Troponin:   ?Recent Labs  ?Lab 05/17/21 ?0218 05/17/21 ?0434  ?TROPONINIHS 9 11  ?   ?Chemistry ?Recent Labs  ?Lab 05/17/21 ?0218 05/18/21 ?0739  ?NA 137 134*  ?K 3.8 4.4  ?CL 103 99  ?CO2 24 27  ?GLUCOSE 205* 233*  ?BUN 10 18  ?CREATININE 0.96 1.13*  ?CALCIUM 9.4 9.0  ?PROT 6.8  --   ?ALBUMIN 3.9  --   ?AST 29  --   ?ALT 20  --   ?ALKPHOS 68  --   ?BILITOT 0.8  --   ?GFRNONAA 59* 48*  ?ANIONGAP 10 8  ?  ?  Lipids No results for input(s): CHOL, TRIG, HDL, LABVLDL, LDLCALC, CHOLHDL in the last 168 hours.  ?Hematology ?Recent Labs  ?Lab 05/17/21 ?0218 05/18/21 ?0739  ?WBC 12.3* 10.4  ?RBC 3.79* 3.69*  ?HGB 12.2 11.9*  ?HCT 37.3 36.2  ?MCV 98.4 98.1  ?MCH 32.2 32.2  ?MCHC 32.7 32.9  ?RDW 12.7 12.4  ?PLT 208 188  ? ?Thyroid No results for input(s): TSH, FREET4 in the last 168 hours.  ?BNP ?Recent Labs  ?Lab 05/17/21 ?0218  ?BNP 72.3  ?  ?DDimer No results for input(s): DDIMER in the last 168 hours.  ? ?Radiology  ?  ?DG Chest Portable 1 View ? ?Result Date: 05/17/2021 ?CLINICAL DATA:  Shortness of breath EXAM: PORTABLE CHEST 1 VIEW COMPARISON:  03/01/2021 FINDINGS: Low lung volumes. Heart is borderline in size with mild vascular congestion and peribronchial thickening. No confluent airspace opacities, effusions or edema. No acute bony abnormality. IMPRESSION: Borderline heart size, mild vascular congestion. Electronically Signed   By: Rolm Baptise M.D.   On: 05/17/2021 02:46   ? ?Cardiac Studies  ? ? ? ?Patient Profile  ?   ?Jenna [name] is a  86 year old woman known to me from clinic with history of smoking, COPD exacerbations, diabetes type 2, chronic lower extremity edema/lymphedema, debility, ?Paroxysmal tachycardia, chronic kidney disease presenting to the hospital with worsening shortness of breath ? ?Assessment & Plan  ?  ?COPD exacerbation ?Very wheezy on presentation yesterday, improving on today's evaluation ?Would continue regular nebs, steroids ?She has history of prior hospital admissions for COPD exacerbation ?Long history of smoking, debility, travels in a wheelchair ?  ?Tachyarrhythmia, history of paroxysmal SVT ?Narrow complex tachycardia noted on arrival, unable to exclude SVT though most consistent with sinus tachycardia in the setting of severe COPD exacerbation, hypoxia ?= Initially treated with diltiazem infusion ?As respiratory status improves heart rate also improved diltiazem infusion discontinued ?-Has been transitioned to bisoprolol ?  ?Morbid obesity/debility ?Reports that she travels in a wheelchair, unable to walk very far ?Would likely benefit from home PT ?  ?Chronic leg swelling ?Suspected component of lymphedema ?Leg elevation, TED hose ?We will try to avoid calcium channel blockers ?  ?Controlled type 2 diabetes with nephropathy ?A1c running relatively well controlled 6.4 ?  ?Essential hypertension ?On bisoprolol, blood pressure stable ? ?[facility] will sign off.   ?Medication Recommendations: No further medication changes ?Other recommendations (labs, testing, etc): No further testing needed ?Follow up as an outpatient: We will arrange outpatient follow-up ? ? Total encounter time more than 40 minutes ? Greater than 50% was spent in counseling and coordination of care with the patient ? ? ?For questions or updates, please contact [place] ?Please consult [email or web address] for contact info under  ? ?  ?   ?Signed, ?Ida Rogue, MD  ?05/18/2021, 11:22 AM    ?

## 2021-05-18 NOTE — Progress Notes (Signed)
*  PRELIMINARY RESULTS* ?Echocardiogram ?2D Echocardiogram has been performed. ? ?Jenna [name], Sonia Side ?05/18/2021, 1:51 PM ?

## 2021-05-18 NOTE — Evaluation (Signed)
Physical Therapy Evaluation ?Patient Details ?Name: Jenna [name] ?MRN: [id number] ?DOB: October 30, 1935 ?Today's Date: 05/18/2021 ? ?History of Present Illness ? Patient is an 86 year old female with a PMH (+) for COPD, history of SVT, sleep apnea, GERD who presents with shortness of breath. Patient does present with a productive cough w/ green sputum. ?  ?Clinical Impression ? Physical Therapy Evaluation completed this date. Patient tolerated session well and was agreeable to treatment. Patient reported only mild pain in her chest due to coughing, that did not increased with physical activity. Patient states she was Mod I with all mobility and ADLs prior to hospitalization. For [place] outings, patient would utilize her power wheelchair, and would furniture surf within the home. Would occasionally utilize her RW for limited community ambulation. Patient lives in a 1 story home with a ramped entrance with her husband and daughter. Patient does not utilize oxygen at home, and reports no falls within the last 6 months.  ? ?Patient demonstrated WFLs of BUE strength, and decreased strength in BLEs (at least 3+/5) however patient reports this as her baseline. Patient was able to complete all bed mobility and sit to stands with and without a RW at Mod I with no physical assistance required. With SpO2 remaining >90% on 2L via [place], patient was able to ambulate 2 laps around the nurses station with RW at Mod I. ~5 feet of furniture surfing was completed within the room with no LOB noted. Patient was left sitting EOB with nursing and family present. Patient will continue to be seen within the hospital to prevent further decline, however does not need follow up skilled physical therapy upon discharge from acute hospitalization.  ?   ? ?Recommendations for follow up therapy are one component of a multi-disciplinary discharge planning process, led by the attending physician.  Recommendations may be updated based on patient  status, additional functional criteria and insurance authorization. ? ?Follow Up Recommendations No PT follow up ? ?  ?Assistance Recommended at Discharge PRN  ?Patient can return home with the following ?   ? ?  ?Equipment Recommendations None recommended by PT (patient has all required DME at home)  ?Recommendations for Other Services ?    ?  ?Functional Status Assessment Patient has had a recent decline in their functional status and demonstrates the ability to make significant improvements in function in a reasonable and predictable amount of time.  ? ?  ?Precautions / Restrictions Precautions ?Precautions: Fall ?Restrictions ?Weight Bearing Restrictions: No  ? ?  ? ?Mobility ? Bed Mobility ?Overal bed mobility: Modified Independent ?  ?  ?  ?  ?  ?  ?  ?Patient Response: Cooperative ? ?Transfers ?Overall transfer level: Modified independent ?Equipment used: Rolling walker (2 wheels), None ?  ?  ?  ?  ?  ?  ?  ?General transfer comment: First sit to stand completed with RW, second attempt completed without RW ?  ? ?Ambulation/Gait ?Ambulation/Gait assistance: Modified independent (Device/Increase time) ?Gait Distance (Feet): 360 Feet ?Assistive device: Rolling walker (2 wheels) ?Gait Pattern/deviations: Step-through pattern, Decreased step length - right, Decreased step length - left, Decreased stride length, Narrow base of support ?Gait velocity: decreased ?  ?  ?General Gait Details: patient ambulated 2 laps around the nurses station on 2L O2 via [place] with O2 remaining >90%, increased fatigue noted at the end, within the room patient ambulated about 5 feet through furniture surfing with no LOB noted ? ?Stairs ?  ?  ?  ?  ?  ? ?  Wheelchair Mobility ?  ? ?Modified Rankin (Stroke Patients Only) ?  ? ?  ? ?Balance Overall balance assessment: Modified Independent ?  ?  ?  ?  ?  ?  ?  ?  ?  ?  ?  ?  ?  ?  ?  ?  ?  ?  ?   ? ? ? ?Pertinent Vitals/Pain Pain Assessment ?Pain Assessment: 0-10 ?Pain Score: 2  ?Pain Location:  Chest from coughing ?Pain Descriptors / Indicators: Discomfort ?Pain Intervention(s): Monitored during session  ? ? ?Home Living Family/patient expects to be discharged to:: Private residence ?Living Arrangements: Spouse/significant other;Children ?Available Help at Discharge: Family;Available 24 hours/day;Available PRN/intermittently ?Type of Home: House ?Home Access: Ramped entrance ?  ?  ?  ?Home Layout: One level ?Home Equipment: Conservation officer, nature (2 wheels);Rollator (4 wheels);Wheelchair - power ?   ?  ?Prior Function Prior Level of Function : Independent/Modified Independent ?  ?  ?  ?  ?  ?  ?Mobility Comments: Uses Wheelchair for long distance community outtings, furniture surfs within the home, RW for short distance community outtings ?ADLs Comments: no assistance needed ?  ? ? ?Hand Dominance  ? Dominant Hand: Right ? ?  ?Extremity/Trunk Assessment  ? Upper Extremity Assessment ?Upper Extremity Assessment: Overall WFL for tasks assessed ?  ? ?Lower Extremity Assessment ?Lower Extremity Assessment: Generalized weakness (at least 3+/5 bilaterally, patient reports this is her baseline level of function) ?  ? ?   ?Communication  ? Communication: No difficulties  ?Cognition Arousal/Alertness: Awake/alert ?Behavior During Therapy: [facility] for tasks assessed/performed ?Overall Cognitive Status: Within Functional Limits for tasks assessed ?  ?  ?  ?  ?  ?  ?  ?  ?  ?  ?  ?  ?  ?  ?  ?  ?General Comments: A&Ox3 self location and situation ?  ?  ? ?  ?General Comments General comments (skin integrity, edema, etc.): HR ranged from 77-95bpm throughout session, SpO2 >90% on 2L O2 via [place] ? ?  ?Exercises Other Exercises ?Other Exercises: patient educated on role of PT in acute care setting, fall risk, and d/c recommendations  ? ?Assessment/Plan  ?  ?PT Assessment Patient needs continued PT services  ?PT Problem List Cardiopulmonary status limiting activity;Decreased strength ? ?   ?  ?PT Treatment Interventions Therapeutic  activities;Therapeutic exercise   ? ?PT Goals (Current goals can be found in the Care Plan section)  ?Acute Rehab PT Goals ?Patient Stated Goal: to go home ?PT Goal Formulation: With patient ?Time For Goal Achievement: 06/01/21 ?Potential to Achieve Goals: Good ? ?  ?Frequency Min 2X/week ?  ? ? ?Co-evaluation   ?  ?  ?  ?  ? ? ?  ?AM-PAC PT "6 Clicks" Mobility  ?Outcome Measure Help needed turning from your back to your side while in a flat bed without using bedrails?: None ?Help needed moving from lying on your back to sitting on the side of a flat bed without using bedrails?: None ?Help needed moving to and from a bed to a chair (including a wheelchair)?: None ?Help needed standing up from a chair using your arms (e.g., wheelchair or bedside chair)?: None ?Help needed to walk in hospital room?: None ?Help needed climbing 3-5 steps with a railing? : A Little ?6 Click Score: 23 ? ?  ?End of Session Equipment Utilized During Treatment: Gait belt;Oxygen ?Activity Tolerance: Patient tolerated treatment well ?Patient left: in bed;with family/visitor present;with nursing/sitter in room ?Nurse  Communication: Mobility status ?PT Visit Diagnosis: Muscle weakness (generalized) (M62.81);Difficulty in walking, not elsewhere classified (R26.2) ?  ? ?Time: 2841-3244 ?PT Time Calculation (min) (ACUTE ONLY): 25 min ? ? ?Charges:   PT Evaluation ?$PT Eval Low Complexity: 1 Low ?PT Treatments ?$Gait Training: 8-22 mins ?  ?   ? ?Iva Boop, PT  ?05/18/21. 9:26 AM ? ? ?

## 2021-05-19 DIAGNOSIS — B370 Candidal stomatitis: Secondary | ICD-10-CM

## 2021-05-19 DIAGNOSIS — E871 Hypo-osmolality and hyponatremia: Secondary | ICD-10-CM

## 2021-05-19 LAB — GLUCOSE, CAPILLARY
Glucose-Capillary: 219 mg/dL — ABNORMAL HIGH (ref 70–99)
Glucose-Capillary: 287 mg/dL — ABNORMAL HIGH (ref 70–99)
Glucose-Capillary: 90 mg/dL (ref 70–99)
Glucose-Capillary: 294 mg/dL — ABNORMAL HIGH (ref 70–99)

## 2021-05-19 MED ORDER — FLUCONAZOLE 100 MG PO TABS
50.0000 mg | ORAL_TABLET | Freq: Every day | ORAL | Status: DC
  Administered 2021-05-20 – 2021-05-21 (×2): 50 mg via ORAL
  Filled 2021-05-19 (×2): qty 0.5

## 2021-05-19 MED ORDER — DILTIAZEM HCL ER COATED BEADS 180 MG PO CP24
180.0000 mg | ORAL_CAPSULE | Freq: Every day | ORAL | Status: DC
  Administered 2021-05-19 – 2021-05-21 (×3): 180 mg via ORAL
  Filled 2021-05-19 (×3): qty 1

## 2021-05-19 MED ORDER — FLUCONAZOLE 100 MG PO TABS
100.0000 mg | ORAL_TABLET | Freq: Once | ORAL | Status: AC
Start: 2021-05-19 — End: 2021-05-19
  Administered 2021-05-19: 100 mg via ORAL
  Filled 2021-05-19: qty 1

## 2021-05-19 NOTE — Progress Notes (Signed)
?  Progress Note ? ? ?Patient: Jenna [name] QPY:[id number] DOB: 25-Nov-1935 DOA: 05/17/2021     2 ?DOS: the patient was seen and examined on 05/19/2021 ?  ? ? ?Assessment and Plan: ?* COPD with acute exacerbation ([place]) ?The patient had acute respiratory distress on presentation required initial BiPAP for tachypnea.  Continue Xopenex nebulizer solution.  Continue Trelegy.  A lot more upper airway wheezing today.  Case discussed with ENT Dr. Pryor Ochoa who did a laryngoscopy today and saw some thrush.  Recommended speech evaluation.  Will start Diflucan.  Continue doxycycline.  Continue Solu-Medrol 40 mg IV twice daily. ? ?PSVT (paroxysmal supraventricular tachycardia) ([place]) ?Heart rate good.  Change bisoprolol over to Cardizem CD with her upper airway wheeze today. ? ?Controlled type 2 diabetes mellitus without complication, without long-term current use of insulin ([place]) ?Sliding scale insulin coverage.  Continue low-dose Semglee insulin with sugars elevated secondary to steroids.  Previous hemoglobin A1c a few months ago 6.4. ? ?Obesity (BMI 35.0-39.9 without comorbidity) ?Obesity with a BMI of 38.05. ? ?Thrush ?Start p.o. Diflucan ? ?Hyponatremia ?Sodium one-point lower than the normal range. ? ?History of depression ?Continue Zoloft ? ?GERD (gastroesophageal reflux disease) ?Continue famotidine ? ? ? ? ?  ? ?Subjective: Patient had a real bad night with coughing and bringing up phlegm.  Also with the upper airway wheezing.  Still short of breath.  Still having the wheezing like she did when she came in. ? ?Physical Exam: ?Vitals:  ? 05/19/21 0356 05/19/21 0723 05/19/21 0821 05/19/21 1124  ?BP: (!) 117/52  137/65 116/76  ?Pulse: 67  73 63  ?Resp: '20  19 18  '$ ?Temp: 97.6 ?F (36.4 ?C)  97.7 ?F (36.5 ?C) 97.8 ?F (36.6 ?C)  ?TempSrc: Oral  Oral   ?SpO2: 92% 93% 92% 97%  ?Weight:      ?Height:      ? ?Physical Exam ?HENT:  ?   Head: Normocephalic.  ?   Mouth/Throat:  ?   Pharynx: No oropharyngeal exudate.  ?Eyes:  ?   General:  Lids are normal.  ?   Conjunctiva/sclera: Conjunctivae normal.  ?Cardiovascular:  ?   Rate and Rhythm: Normal rate and regular rhythm.  ?   Heart sounds: Normal heart sounds, S1 normal and S2 normal.  ?Pulmonary:  ?   Breath sounds: Transmitted upper airway sounds present. Examination of the right-lower field reveals decreased breath sounds. Examination of the left-lower field reveals decreased breath sounds. Decreased breath sounds present. No wheezing, rhonchi or rales.  ?   Comments: Upper airway wheeze. ?Abdominal:  ?   Palpations: Abdomen is soft.  ?   Tenderness: There is no abdominal tenderness.  ?Musculoskeletal:  ?   Right lower leg: Swelling present.  ?   Left lower leg: Swelling present.  ?   Comments: Left leg always more swollen than right.  ?Skin: ?   General: Skin is warm.  ?   Findings: No lesion or rash.  ?Neurological:  ?   Mental Status: She is alert and oriented to person, place, and time.  ?  ?Data Reviewed: ?Sodium 134, creatinine 1.13, hemoglobin 11.9 ? ?Family Communication: No family at the bedside this morning.  Tried to reach daughter this afternoon but mailbox was full ? ?Disposition: ?Status is: Inpatient ?Remains inpatient appropriate because: Still having upper airway wheezing. ? ?Planned Discharge Destination: Home ? ? ?Author: ?Loletha Grayer, MD ?05/19/2021 12:16 PM ? ?For on call review [email or web address].  ?

## 2021-05-19 NOTE — Assessment & Plan Note (Addendum)
Continue p.o. Diflucan ?

## 2021-05-19 NOTE — Assessment & Plan Note (Addendum)
Today sodium normal range ?

## 2021-05-19 NOTE — Consult Note (Signed)
Jenna, [name] ?[id number] ?[birth year] ?Jenna Grayer, MD ? ?Reason for Consult: Shortness of breath ? ?HPI: 86 year old female with history of COPD and reflux admitted for exacerbation with intermittent upper airway issues.  Asked to evaluate for possible upper airway cause of patient's intermittent symptoms.  Patient reports intermittent shortness of breath and intermittent dysphonia.  She has a longstanding history of this and previously was seen at [facility] by Nadeen Landau but not in 15 years.  Patient reports significant nebulizer use as well as steroids and antibiotics. ? ?Allergies:  ?Allergies  ?Allergen Reactions  ? Azithromycin Other (See Comments)  ?  Causes stomach burning pains  ? Hydromorphone Other (See Comments)  ?  confusion, personality change  ? Morphine Other (See Comments)  ?  Goes crazy  ? Nsaids Other (See Comments)  ?  Avoids because of gastric bypass surgery  ? Oxycodone-Acetaminophen Nausea And Vomiting  ? Codeine Other (See Comments) and Nausea And Vomiting  ?  "will not stay down"  ? Nabumetone Rash  ? Oxycodone Nausea And Vomiting  ? Oxycodone-Acetaminophen Nausea And Vomiting  ? Promethazine Hcl Rash and Other (See Comments)  ? ? ?ROS: Review of systems normal other than 12 systems except per HPI. ? ?PMH:  ?Past Medical History:  ?Diagnosis Date  ? Anxiety   ? Arthritis   ? neck, knees(before replacements)  ? Asthma   ? CKD (chronic kidney disease), stage III ([place])   ? COPD (chronic obstructive pulmonary disease) ([place])   ? Encounter for colonoscopy due to history of adenomatous colonic polyps   ? GERD (gastroesophageal reflux disease)   ? Hearing loss of both ears   ? History of echocardiogram   ? a. 10/2013 Echo: EF 60-65%, no rwma, midlly dil LA. Rnl RV fxn.  ? Hyperlipidemia   ? Osteoporosis   ? Palpitations   ? PSVT (paroxysmal supraventricular tachycardia) ([place])   ? a. 05/2018 Zio: 99 SVT runs. Fastest 218 x 4:30. Longest 4:38 w/ rate of 172.  ? Sleep apnea   ? resolved with  gastric bypass  ? Syncope and collapse   ? Urine incontinence   ? ? ?FH:  ?Family History  ?Problem Relation Age of Onset  ? Heart attack Mother   ? Hypertension Mother   ? Diabetes type II Mother   ? Pneumonia Father   ? Skin cancer Father   ? Diabetes type II Father   ? Colon cancer Sister   ? Ovarian cancer Sister   ? Heart attack Brother   ? Heart attack Sister 25  ? Hyperlipidemia Sister   ? Hypertension Sister   ? ? ?SH:  ?Social History  ? ?Socioeconomic History  ? Marital status: Married  ?  Spouse name: Not on file  ? Number of children: Not on file  ? Years of education: Teacher, English as a foreign language   ? Highest education level: High school graduate  ?Occupational History  ? Not on file  ?Tobacco Use  ? Smoking status: Former  ?  Packs/day: 1.00  ?  Years: 20.00  ?  Pack years: 20.00  ?  Types: Cigarettes  ?  Quit date: 07/1981  ?  Years since quitting: 39.8  ? Smokeless tobacco: Former  ?Vaping Use  ? Vaping Use: Never used  ?Substance and Sexual Activity  ? Alcohol use: Not Currently  ?  Comment: past  ? Drug use: No  ? Sexual activity: Not on file  ?Other Topics Concern  ? Not  on file  ?Social History Narrative  ? Not on file  ? ?Social Determinants of Health  ? ?Financial Resource Strain: Low Risk   ? Difficulty of Paying Living Expenses: Not hard at all  ?Food Insecurity: No Food Insecurity  ? Worried About Charity fundraiser in the Last Year: Never true  ? Ran Out of Food in the Last Year: Never true  ?Transportation Needs: No Transportation Needs  ? Lack of Transportation (Medical): No  ? Lack of Transportation (Non-Medical): No  ?Physical Activity: Insufficiently Active  ? Days of Exercise per Week: 2 days  ? Minutes of Exercise per Session: 60 min  ?Stress: Stress Concern Present  ? Feeling of Stress : To some extent  ?Social Connections: Moderately Integrated  ? Frequency of Communication with Friends and Family: More than three times a week  ? Frequency of Social Gatherings with Friends and  Family: More than three times a week  ? Attends Religious Services: More than 4 times per year  ? Active Member of Clubs or Organizations: No  ? Attends Archivist Meetings: Never  ? Marital Status: Married  ?Intimate Partner Violence: Not At Risk  ? Fear of Current or Ex-Partner: No  ? Emotionally Abused: No  ? Physically Abused: No  ? Sexually Abused: No  ? ? ?PSH:  ?Past Surgical History:  ?Procedure Laterality Date  ? BROW LIFT Bilateral 06/27/2015  ? Procedure: BLEPHAROPLASTY BILATERAL UPPER EYELIDS BILATERAL BLEPHAROTOSIS EYELIDS;  Surgeon: Karle Starch, MD;  Location: [place];  Service: Ophthalmology;  Laterality: Bilateral;  BILATERAL  ? CATARACT EXTRACTION W/PHACO Right 02/13/2015  ? Procedure: CATARACT EXTRACTION PHACO AND INTRAOCULAR LENS PLACEMENT (IOC);  Surgeon: Ronnell Freshwater, MD;  Location: [place];  Service: Ophthalmology;  Laterality: Right;  ? CATARACT EXTRACTION W/PHACO Left 03/22/2015  ? Procedure: CATARACT EXTRACTION PHACO AND INTRAOCULAR LENS PLACEMENT (IOC);  Surgeon: Ronnell Freshwater, MD;  Location: [place];  Service: Ophthalmology;  Laterality: Left;  TORIC  ? CHOLECYSTECTOMY  1984  ? COLECTOMY    ? COSMETIC SURGERY  2012  ? tummy tuck and excess skin removal  ? GASTRIC BYPASS  2010  ? REPLACEMENT TOTAL KNEE BILATERAL  1998  ? SHOULDER SURGERY  2009  ? left   ? TONSILLECTOMY    ? TOTAL ABDOMINAL HYSTERECTOMY  1979  ? ? ?Physical  Exam:  ?GEN-  CN 2-12 grossly intact and symmetric. ?EARS- external ears clear with no drainage ?OC/OP-  diffuse whitish plaques on buccal oral mucosa, smaller whitish plaques on soft palate and along posterior oropharynx ?NECK- supple with no LAD nor masses ?RESP-  unlabored during exam ?CARD-  RRR ? ?Procedure:  Transnasal flexible laryngoscopy.  Pre-procedure diagnosis:  Dysphonia, shortness of breath.  Post-procedure diagnosis:  same.  Description of procedure:  After verbal consent was obtained, the  patient's right and left nasal cavity was anesthetized with gen-nasal and lidocaine.  A flexible laryngoscope was placed into the patient's left nasal cavity.  This was advanced for visualization of the patient's nasopharynx, oropharynx, hypopharynx, and larynx.  This demonstrated some whitish plaques consistent with thrush in nasopharynx and superior hypopharynx.  Larynx was clear with normal vocal cord mobility.  No pooling of secretions or lesions of hypopharynx.  Post-cricoid was relatively clear showing no significant inflammation.  Moderate muscle tension dysphonia with speech ? ?A/P: Shortness of breath, COPD exacerbation, Thrush ? ?Plan:  Recommend evaluation by speech for dysphonia and significant muscle tension dysphonia that  may be contributing to cough.  Mild thrush and recommend treatment with either diflucan or clotrimazole troche.  Patient's airway, however is widely patent with no significant inflammation or obstruction to explain her shortness of breath. ? ? ?Annaleah Arata ?05/19/2021 ?10:56 AM ? ? ? ?

## 2021-05-20 DIAGNOSIS — N183 Chronic kidney disease, stage 3 unspecified: Secondary | ICD-10-CM

## 2021-05-20 DIAGNOSIS — N1831 Chronic kidney disease, stage 3a: Secondary | ICD-10-CM

## 2021-05-20 DIAGNOSIS — B370 Candidal stomatitis: Secondary | ICD-10-CM

## 2021-05-20 LAB — CBC
HCT: 38.9 % (ref 36.0–46.0)
Hemoglobin: 13 g/dL (ref 12.0–15.0)
MCH: 32.4 pg (ref 26.0–34.0)
MCHC: 33.4 g/dL (ref 30.0–36.0)
MCV: 97 fL (ref 80.0–100.0)
Platelets: 233 10*3/uL (ref 150–400)
RBC: 4.01 MIL/uL (ref 3.87–5.11)
RDW: 12.3 % (ref 11.5–15.5)
WBC: 9.2 10*3/uL (ref 4.0–10.5)
nRBC: 0 % (ref 0.0–0.2)

## 2021-05-20 LAB — BASIC METABOLIC PANEL
Anion gap: 11 (ref 5–15)
BUN: 38 mg/dL — ABNORMAL HIGH (ref 8–23)
CO2: 26 mmol/L (ref 22–32)
Calcium: 9.4 mg/dL (ref 8.9–10.3)
Chloride: 102 mmol/L (ref 98–111)
Creatinine, Ser: 1.24 mg/dL — ABNORMAL HIGH (ref 0.44–1.00)
GFR, Estimated: 43 mL/min — ABNORMAL LOW (ref >60)
Glucose, Bld: 202 mg/dL — ABNORMAL HIGH (ref 70–99)
Potassium: 4.8 mmol/L (ref 3.5–5.1)
Sodium: 139 mmol/L (ref 135–145)

## 2021-05-20 LAB — GLUCOSE, CAPILLARY
Glucose-Capillary: 213 mg/dL — ABNORMAL HIGH (ref 70–99)
Glucose-Capillary: 248 mg/dL — ABNORMAL HIGH (ref 70–99)
Glucose-Capillary: 249 mg/dL — ABNORMAL HIGH (ref 70–99)
Glucose-Capillary: 215 mg/dL — ABNORMAL HIGH (ref 70–99)

## 2021-05-20 MED ORDER — PANTOPRAZOLE SODIUM 40 MG PO TBEC
40.0000 mg | DELAYED_RELEASE_TABLET | Freq: Every day | ORAL | Status: DC
  Administered 2021-05-20 – 2021-05-21 (×2): 40 mg via ORAL
  Filled 2021-05-20 (×2): qty 1

## 2021-05-20 NOTE — Evaluation (Signed)
Speech Language Pathology Evaluation ?Patient Details ?Name: Jenna [name] ?MRN: [id number] ?DOB: [birth year] ?Today's Date: 05/20/2021 ?Time: 6948-5462 ?SLP Time Calculation (min) (ACUTE ONLY): 37 min ? ?Problem List:  ?Patient Active Problem List  ? Diagnosis Date Noted  ? CKD (chronic kidney disease), stage III ([place]) 05/20/2021  ? Thrush 05/19/2021  ? Hyponatremia 05/19/2021  ? History of depression   ? Acute respiratory distress   ? COPD with acute exacerbation ([place]) 03/01/2021  ? History of gastric bypass 04/22/2020  ? Age-related osteoporosis without current pathological fracture 06/03/2019  ? PSVT (paroxysmal supraventricular tachycardia) ([place]) 02/01/2019  ? Mild nonproliferative diabetic retinopathy of left eye with macular edema ([place]) 10/13/2018  ? Mild nonproliferative diabetic retinopathy of right eye without macular edema ([place]) 10/13/2018  ? Controlled type 2 diabetes mellitus without complication, without long-term current use of insulin ([place]) 11/13/2017  ? Hyperlipidemia associated with type 2 diabetes mellitus ([place]) 11/13/2017  ? Depression, major, recurrent ([place]) 11/12/2017  ? Psychophysiological insomnia 11/12/2017  ? Primary osteoarthritis of both hands 11/12/2017  ? Primary osteoarthritis involving multiple joints 11/12/2017  ? Obesity (BMI 35.0-39.9 without comorbidity) 11/13/2013  ? GERD (gastroesophageal reflux disease) 11/13/2013  ? Bilateral leg edema 11/13/2013  ? Chronic cough 11/13/2013  ? Hx of adenomatous colonic polyps 10/15/2013  ? ?Past Medical History:  ?Past Medical History:  ?Diagnosis Date  ? Anxiety   ? Arthritis   ? neck, knees(before replacements)  ? Asthma   ? CKD (chronic kidney disease), stage III ([place])   ? COPD (chronic obstructive pulmonary disease) ([place])   ? Encounter for colonoscopy due to history of adenomatous colonic polyps   ? GERD (gastroesophageal reflux disease)   ? Hearing loss of both ears   ? History of echocardiogram   ? a. 10/2013 Echo: EF 60-65%, no rwma,  midlly dil LA. Rnl RV fxn.  ? Hyperlipidemia   ? Osteoporosis   ? Palpitations   ? PSVT (paroxysmal supraventricular tachycardia) ([place])   ? a. 05/2018 Zio: 99 SVT runs. Fastest 218 x 4:30. Longest 4:38 w/ rate of 172.  ? Sleep apnea   ? resolved with gastric bypass  ? Syncope and collapse   ? Urine incontinence   ? ?Past Surgical History:  ?Past Surgical History:  ?Procedure Laterality Date  ? BROW LIFT Bilateral 06/27/2015  ? Procedure: BLEPHAROPLASTY BILATERAL UPPER EYELIDS BILATERAL BLEPHAROTOSIS EYELIDS;  Surgeon: Karle Starch, MD;  Location: [place];  Service: Ophthalmology;  Laterality: Bilateral;  BILATERAL  ? CATARACT EXTRACTION W/PHACO Right 02/13/2015  ? Procedure: CATARACT EXTRACTION PHACO AND INTRAOCULAR LENS PLACEMENT (IOC);  Surgeon: Ronnell Freshwater, MD;  Location: [place];  Service: Ophthalmology;  Laterality: Right;  ? CATARACT EXTRACTION W/PHACO Left 03/22/2015  ? Procedure: CATARACT EXTRACTION PHACO AND INTRAOCULAR LENS PLACEMENT (IOC);  Surgeon: Ronnell Freshwater, MD;  Location: [place];  Service: Ophthalmology;  Laterality: Left;  TORIC  ? CHOLECYSTECTOMY  1984  ? COLECTOMY    ? COSMETIC SURGERY  2012  ? tummy tuck and excess skin removal  ? GASTRIC BYPASS  2010  ? REPLACEMENT TOTAL KNEE BILATERAL  1998  ? SHOULDER SURGERY  2009  ? left   ? TONSILLECTOMY    ? TOTAL ABDOMINAL HYSTERECTOMY  1979  ? ?HPI:  ?Per H&P "Jenna [name] is a 86 y.o. female with medical history significant for COPD, non-insulin-dependent type 2 diabetes, chronic lower extremity edema, obesity with history of gastric bypass, HTN, ex-smoker, paroxysmal tachycardia on scheduled and as  needed metoprolol, followed by [facility] cardiologist, Dr. Rockey Situ, last seen 04/02/2021 (note reviewed), who presents to the ED with a 2-day history of shortness of breath and congested cough, not improving after several DuoNeb treatments.  Her husband at the bedside who contributes to history states  that he gave her up to 7 breathing treatments today, each with 2 vials of albuterol but without significant improvement.  She had no fever or chills or complaints of chest pain.  ED course and data review: Patient was placed on BiPAP for comfort.  Vitals notable for heart rate up to the 150s, with respirations to the 30s with O2 sat 96 to 97% on BiPAP.  BP 161/79.  VBG with pH 7.44 and PCO2 41.  Troponin 9, BNP 72.  WBC 12,000 with CBC and CMP otherwise unremarkable except for slightly elevated glucose of 205.  COVID and flu negative.  EKG, personally viewed and interpreted: Supraventricular tachycardia at 157 with nonspecific ST-T wave changes.  Chest x-ray showed borderline heart size with mild vascular congestion.  Patient was initially treated with DuoNebs and methylprednisolone.  She was noted to have persistently elevated heart rate in the 150s which was treated with diltiazem 20 mg bolus, with inadequate response.  She was subsequently trialed with repeated doses of adenosine and was subsequently placed on a diltiazem infusion.  Hospitalist subsequently consulted for admission."  ? ?Assessment / Plan / Recommendation ?Clinical Impression ? Pt seen for voice evaluation. Transnasal flexible laryngoscopy completed, 05/19/21, by ENT. Per ENT note, "Recommend evaluation by speech for dysphonia and significant muscle tension dysphonia that may be contributing to cough.  Mild thrush and recommend treatment with either diflucan or clotrimazole troche.  Patient's airway, however is widely patent with no significant inflammation or obstruction to explain her shortness of breath." ? ?Pt alert, pleasant, and cooperative. During conversational speech, voice with perceptual features of intermittent vocal hoarseness and reduced vocal loudness. Intermittent throat clearing and coughing noted which appeared related to movement/exertion. Strong cough appreciated. Average max phonation time ~5s (15-25s is typical for adult  females). S/Z ratio = 1.4 which may be indicated of vocal cord impairment. Circumlaryngeal massage complex for 5 minutes. Average max phonation time increased to ~9 seconds, and perceptually, pt with mildly decreased vocal hoarseness and mildly increased vocal loudness. Voice Handicap Index completed with SLP reading questions to pt, pt scored 26/120 indicated a minimal amount of handicap. ? ?Pt educated regarded basic vocal hygiene (e.g. rest breaks, maintaining hydration, avoiding irritants, following reflux precautions), how to promote breath support with phonation, and SLP POC. Pt verbalized understanding/agreement. ? ?Recommend outpatient SLP services for voice treatment.  ? ?Pt, TOC, and RN made aware of results, recommendations, and SLP POC. Pt verbalized understanding/agreement. ?   ?SLP Assessment ? SLP Visit Diagnosis: Other (comment) (dysphonia)  ?  ?Recommendations for follow up therapy are one component of a multi-disciplinary discharge planning process, led by the attending physician.  Recommendations may be updated based on patient status, additional functional criteria and insurance authorization. ?   ?Follow Up Recommendations ? Outpatient SLP  ?  ?Assistance Recommended at Discharge ?  (TBD)  ?Functional Status Assessment Patient has had a recent decline in their functional status and demonstrates the ability to make significant improvements in function in a reasonable and predictable amount of time.  ?   ?   ?SLP Evaluation ?Cognition ? Overall Cognitive Status: Within Functional Limits for tasks assessed  ?  ?   ?Comprehension ? Auditory Comprehension ?Overall Auditory  Comprehension: Appears within functional limits for tasks assessed  ?  ?Expression Expression ?Primary Mode of Expression: Verbal ?Verbal Expression ?Overall Verbal Expression: Appears within functional limits for tasks assessed   ?Oral / Motor ? Oral Motor/Sensory Function ?Overall Oral Motor/Sensory Function: Within functional  limits ?Motor Speech ?Overall Motor Speech: Appears within functional limits for tasks assessed ?Respiration: Impaired ?Level of Impairment: Phrase ?Phonation: Hoarse;Low vocal intensity ?Resonance: Within function

## 2021-05-20 NOTE — Assessment & Plan Note (Signed)
Patient with CKD stage IIIa.  Creatinine on presentation was 0.96 with a GFR 59.  Hold Lasix today with creatinine of 1.24. ?

## 2021-05-20 NOTE — Progress Notes (Signed)
?Progress Note ? ? ?Patient: Jenna [name] KCL:[id number] DOB: 1935-11-03 DOA: 05/17/2021     3 ?DOS: the patient was seen and examined on 05/20/2021 ?  ? ? ?Assessment and Plan: ?* COPD with acute exacerbation ([place]) ?The patient had acute respiratory distress on presentation required initial BiPAP for tachypnea.  Continue Xopenex nebulizer solution.  Continue Trelegy.  Upper airway wheezing today with sitting up and exertion. Appreciate speech evaluation.  Continue Diflucan.  Continue doxycycline.  Continue Solu-Medrol. ? ?PSVT (paroxysmal supraventricular tachycardia) ([place]) ?Heart rate good.  Continue Cardizem CD. ? ?Controlled type 2 diabetes mellitus without complication, without long-term current use of insulin ([place]) ?Sliding scale insulin coverage.  Continue low-dose Semglee insulin with sugars elevated secondary to steroids.  Previous hemoglobin A1c a few months ago 6.4. ? ?Obesity (BMI 35.0-39.9 without comorbidity) ?Obesity with a BMI of 38.05. ? ?Thrush ?Continue p.o. Diflucan ? ?Hyponatremia ?Today sodium normal range ? ?CKD (chronic kidney disease), stage III ([place]) ?Patient with CKD stage IIIa.  Creatinine on presentation was 0.96 with a GFR 59.  Hold Lasix today with creatinine of 1.24. ? ?History of depression ?Continue Zoloft ? ?GERD (gastroesophageal reflux disease) ?Continue famotidine.  Add Protonix just in case this is contributing to the upper airway wheeze ? ? ? ? ?  ? ?Subjective: Patient feeling better today than yesterday.  When she sat up for me to listen to her lungs then she started audible upper airway wheezing again.  I did not hear this while she was lying back.  She states that this happens at home with exertion also.  Continue treating for COPD exacerbation. ? ?Physical Exam: ?Vitals:  ? 05/20/21 0442 05/20/21 0748 05/20/21 0748 05/20/21 1155  ?BP:   (!) 138/58 (!) 113/52  ?Pulse: 75  (!) 59 (!) 59  ?Resp:   19 18  ?Temp:   98.1 ?F (36.7 ?C) 98 ?F (36.7 ?C)  ?TempSrc:   Oral Oral   ?SpO2: 97% 95% 98% 96%  ?Weight:      ?Height:      ? ?Physical Exam ?HENT:  ?   Head: Normocephalic.  ?   Mouth/Throat:  ?   Pharynx: No oropharyngeal exudate.  ?Eyes:  ?   General: Lids are normal.  ?   Conjunctiva/sclera: Conjunctivae normal.  ?Cardiovascular:  ?   Rate and Rhythm: Normal rate and regular rhythm.  ?   Heart sounds: Normal heart sounds, S1 normal and S2 normal.  ?Pulmonary:  ?   Breath sounds: Transmitted upper airway sounds present. Examination of the right-lower field reveals decreased breath sounds. Examination of the left-lower field reveals decreased breath sounds. Decreased breath sounds present. No wheezing, rhonchi or rales.  ?   Comments: Upper airway wheeze worse after exerting to sit up. ?Abdominal:  ?   Palpations: Abdomen is soft.  ?   Tenderness: There is no abdominal tenderness.  ?Musculoskeletal:  ?   Right lower leg: Swelling present.  ?   Left lower leg: Swelling present.  ?   Comments: Left leg always more swollen than right.  ?Skin: ?   General: Skin is warm.  ?   Findings: No lesion or rash.  ?Neurological:  ?   Mental Status: She is alert and oriented to person, place, and time.  ?  ?Data Reviewed: ?Creatinine 1.24 with a GFR 43 today, hemoglobin 13.0 ? ?Family Communication: Tried calling husband on cell phone but voicemail box is full ? ?Disposition: ?Status is: Inpatient ?Remains inpatient appropriate because: Lateral upper  airway wheeze with minimal exertion and just sitting up. ?Planned Discharge Destination: Home ? ? ?Author: ?Loletha Grayer, MD ?05/20/2021 12:20 PM ? ?For on call review [email or web address].  ?

## 2021-05-21 ENCOUNTER — Telehealth: Payer: Self-pay

## 2021-05-21 LAB — GLUCOSE, CAPILLARY
Glucose-Capillary: 235 mg/dL — ABNORMAL HIGH (ref 70–99)
Glucose-Capillary: 290 mg/dL — ABNORMAL HIGH (ref 70–99)

## 2021-05-21 MED ORDER — DILTIAZEM HCL ER COATED BEADS 180 MG PO CP24: 180.0000 mg | ORAL_CAPSULE | Freq: Every day | ORAL | 0 refills | Status: DC

## 2021-05-21 MED ORDER — FLUCONAZOLE 50 MG PO TABS: 50.0000 mg | ORAL_TABLET | Freq: Every day | ORAL | 0 refills | Status: AC

## 2021-05-21 MED ORDER — PANTOPRAZOLE SODIUM 40 MG PO TBEC: 40.0000 mg | DELAYED_RELEASE_TABLET | Freq: Every day | ORAL | 0 refills | Status: DC

## 2021-05-21 MED ORDER — DOXYCYCLINE HYCLATE 100 MG PO TABS
100.0000 mg | ORAL_TABLET | Freq: Two times a day (BID) | ORAL | 0 refills | Status: AC
Start: 2021-05-21 — End: 2021-05-24

## 2021-05-21 MED ORDER — ALBUTEROL SULFATE (2.5 MG/3ML) 0.083% IN NEBU: 2.5000 mg | INHALATION_SOLUTION | Freq: Four times a day (QID) | RESPIRATORY_TRACT | 0 refills | Status: DC | PRN

## 2021-05-21 NOTE — Progress Notes (Signed)
?  Transition of Care (TOC) Screening Note ?DC Note ? ?Patient Details  ?Name: Jenna [name] ?Date of Birth: 02/15/1935 ? ? ?Transition of Care (TOC) CM/SW Contact:    ?Alberteen Sam, LCSW ?Phone Number: ?05/21/2021, 10:28 AM ? ?DC No Needs ? ? ?Transition of Care Department [facility]) has reviewed patient and no TOC needs have been identified at this time. We will continue to monitor patient advancement through interdisciplinary progression rounds. If new patient transition needs arise, please place a TOC consult. ? Pricilla Riffle, [place] ?[phone] ? ?

## 2021-05-21 NOTE — Plan of Care (Signed)
?  Problem: Education: ?Goal: Knowledge of disease or condition will improve ?Outcome: Progressing ?Goal: Knowledge of the prescribed therapeutic regimen will improve ?Outcome: Progressing ?Goal: Individualized Educational Video(s) ?Outcome: Progressing ?  ?Problem: Activity: ?Goal: Ability to tolerate increased activity will improve ?Outcome: Progressing ?Goal: Will verbalize the importance of balancing activity with adequate rest periods ?Outcome: Progressing ?  ?Problem: Respiratory: ?Goal: Ability to maintain a clear airway will improve ?Outcome: Progressing ?Goal: Levels of oxygenation will improve ?Outcome: Progressing ?Goal: Ability to maintain adequate ventilation will improve ?Outcome: Progressing ?  ?Problem: Education: ?Goal: Knowledge of disease or condition will improve ?Outcome: Progressing ?Goal: Understanding of medication regimen will improve ?Outcome: Progressing ?Goal: Individualized Educational Video(s) ?Outcome: Progressing ?  ?Problem: Activity: ?Goal: Ability to tolerate increased activity will improve ?Outcome: Progressing ?  ?Problem: Cardiac: ?Goal: Ability to achieve and maintain adequate cardiopulmonary perfusion will improve ?Outcome: Progressing ?  ?Problem: Health Behavior/Discharge Planning: ?Goal: Ability to safely manage health-related needs after discharge will improve ?Outcome: Progressing ?  ?

## 2021-05-21 NOTE — Progress Notes (Signed)
Patient to dc home today, patient had reported old nebulizer at home unaware of any agency that she received it from for follow up.  ? ?CSW has informed Zach with [employer], they will follow up and CSW has requested MD put in orders for nebulizer.  ? ?No further discharge needs.  ? Pricilla Riffle, [place] ?[phone] ? ?

## 2021-05-21 NOTE — Telephone Encounter (Signed)
I will make a note on the schedule and please keep apt for 5/3 we will adjust medication doses at her scheduled appointment. I see variety of medications that need to be reconciled and updated at the [place] apt prior to me changing any doses before that apt. Thank you ? ?Jenna Putnam, DO ?[facility] ?[facility] ?05/21/2021, 5:07 PM ? ?

## 2021-05-21 NOTE — Discharge Summary (Signed)
?Physician Discharge Summary ?  ?Patient: Jenna [name] MRN: [id number] DOB: [birth year]  ?Admit date:     05/17/2021  ?Discharge date: 05/21/21  ?Discharge Physician: Loletha Grayer  ? ?PCP: Olin Hauser, DO  ? ?Recommendations at discharge:  ? ?Follow-up PCP 5 days ? ?Discharge Diagnoses: ?Principal Problem: ?  COPD with acute exacerbation ([place]) ?Active Problems: ?  PSVT (paroxysmal supraventricular tachycardia) ([place]) ?  Controlled type 2 diabetes mellitus without complication, without long-term current use of insulin ([place]) ?  Obesity (BMI 35.0-39.9 without comorbidity) ?  Thrush ?  Hyponatremia ?  GERD (gastroesophageal reflux disease) ?  History of gastric bypass ?  History of depression ?  CKD (chronic kidney disease), stage III ([place]) ? ? ? ?Hospital Course: ?The patient was admitted to the hospital on 05/17/2021 and discharged on 05/21/2021.  The patient came in with shortness of breath and had acute respiratory distress requiring BiPAP initially.  Admitted with acute respiratory failure, COPD exacerbation with upper airway wheeze.  Patient also had paroxysmal supraventricular tachycardia.  Patient was still having upper airway congestion and wheeze and Dr. Pryor Ochoa from ENT did a laryngoscopy.  They did find a little thrush.  On the day of discharge the patient was much improved and had no upper airway wheeze.  She completed 5 days of IV Solu-Medrol here in the hospital.  I will give a few more days of doxycycline upon going home.  Patient will also complete a course of thrush.  We also added a PPI for acid reflux which could be contributing to the upper airway congestion.  ENT recommended speech therapy evaluation as outpatient for dysphonia.  Patient was seen by speech therapy here.  Patient much improved upon disposition. ? ?Assessment and Plan: ?* COPD with acute exacerbation ([place]) ?The patient had acute respiratory distress on presentation required initial BiPAP for tachypnea.  We gave Xopenex  nebulizer solution.  Continue Trelegy.  Upper airway wheezing completely resolved.  Continue Diflucan.  Continue doxycycline for few more days.  Completed 5 days of Solu-Medrol ? ?PSVT (paroxysmal supraventricular tachycardia) ([place]) ?Heart rate good.  Continue Cardizem CD.  With the upper airway congestion I stopped the Toprol and use Cardizem CD instead. ? ?Controlled type 2 diabetes mellitus without complication, without long-term current use of insulin ([place]) ?Sliding scale insulin coverage and low-dose Semglee insulin here while on steroids.  Previous hemoglobin A1c a few months ago 6.4.  Will not need any medications upon discharge. ? ?Obesity (BMI 35.0-39.9 without comorbidity) ?Obesity with a BMI of 38.05. ? ?Thrush ?Continue p.o. Diflucan ? ?Hyponatremia ?Last sodium 139 ? ?CKD (chronic kidney disease), stage III ([place]) ?Patient with CKD stage IIIa.  Last creatinine 1.24.  Recommend checking BMP as outpatient. ? ?History of depression ?Continue Zoloft ? ?GERD (gastroesophageal reflux disease) ?Continue famotidine.  Added Protonix just in case this is contributing to the upper airway wheeze ? ? ? ? ?  ? ? ?Consultants: ENT, cardiology ?Procedures performed: Laryngoscopy ?Disposition: Home ?Diet recommendation:  ?Cardiac and Carb modified diet ?DISCHARGE MEDICATION: ?Allergies as of 05/21/2021   ? ?   Reactions  ? Azithromycin Other (See Comments)  ? Causes stomach burning pains  ? Hydromorphone Other (See Comments)  ? confusion, personality change  ? Morphine Other (See Comments)  ? Goes crazy  ? Nsaids Other (See Comments)  ? Avoids because of gastric bypass surgery  ? Oxycodone-acetaminophen Nausea And Vomiting  ? Codeine Other (See Comments), Nausea And Vomiting  ? "will not stay down"  ?  Nabumetone Rash  ? Oxycodone Nausea And Vomiting  ? Oxycodone-acetaminophen Nausea And Vomiting  ? Promethazine Hcl Rash, Other (See Comments)  ? ?  ? ?  ?Medication List  ?  ? ?STOP taking these medications   ? ?metoprolol  succinate 25 MG 24 hr tablet ?Commonly known as: TOPROL-XL ?  ?terbinafine 250 MG tablet ?Commonly known as: LAMISIL ?  ? ?  ? ?TAKE these medications   ? ?albuterol 108 (90 Base) MCG/ACT inhaler ?Commonly known as: VENTOLIN HFA ?Inhale 2 puffs into the lungs every 6 (six) hours as needed for wheezing or shortness of breath. ?What changed: Another medication with the same name was changed. Make sure you understand how and when to take each. ?  ?albuterol (2.5 MG/3ML) 0.083% nebulizer solution ?Commonly known as: PROVENTIL ?Take 3 mLs (2.5 mg total) by nebulization every 6 (six) hours as needed for wheezing or shortness of breath. ?What changed: when to take this ?  ?Cholecalciferol 25 MCG (1000 UT) tablet ?Take 1,000 Units by mouth daily. ?  ?Cyanocobalamin 1000 MCG Tbcr ?Take 1,000 mcg by mouth daily. ?  ?diltiazem 180 MG 24 hr capsule ?Commonly known as: CARDIZEM CD ?Take 1 capsule (180 mg total) by mouth daily. ?Start taking on: May 22, 2021 ?  ?doxycycline 100 MG tablet ?Commonly known as: VIBRA-TABS ?Take 1 tablet (100 mg total) by mouth every 12 (twelve) hours for 5 doses. ?  ?famotidine 20 MG tablet ?Commonly known as: PEPCID ?Take 20 mg by mouth 2 (two) times daily. ?  ?ferrous sulfate 325 (65 FE) MG EC tablet ?Take 325 mg by mouth at bedtime. ?  ?Fish Oil 1000 MG Caps ?Take 1,000 mg by mouth daily. ?  ?fluconazole 50 MG tablet ?Commonly known as: DIFLUCAN ?Take 1 tablet (50 mg total) by mouth daily for 11 days. ?Start taking on: May 22, 2021 ?  ?furosemide 20 MG tablet ?Commonly known as: LASIX ?Take 1 tablet (20 mg total) by mouth daily as needed for fluid or edema. Take lasix daily, with extra dose as needed for swelling ?  ?gabapentin 100 MG capsule ?Commonly known as: NEURONTIN ?Start 1 capsule daily, increase by 1 cap every 2-3 days as tolerated up to 3 times a day, or may take 3 at once in evening. ?  ?methocarbamol 500 MG tablet ?Commonly known as: Robaxin ?Take 1 tablet (500 mg total) by mouth every 8  (eight) hours as needed for muscle spasms. ?  ?Myrbetriq 25 MG Tb24 tablet ?Generic drug: mirabegron ER ?Take 1 tablet (25 mg total) by mouth daily. ?  ?One-A-Day Womens [employer] ?Take 2 tablets by mouth every morning. ?  ?pantoprazole 40 MG tablet ?Commonly known as: PROTONIX ?Take 1 tablet (40 mg total) by mouth daily. ?Start taking on: May 22, 2021 ?  ?sertraline 50 MG tablet ?Commonly known as: ZOLOFT ?Take 1 tablet (50 mg total) by mouth daily. ?  ?simvastatin 40 MG tablet ?Commonly known as: ZOCOR ?Take 1 tablet (40 mg total) by mouth daily. ?  ?traZODone 100 MG tablet ?Commonly known as: DESYREL ?Take 1 tablet (100 mg total) by mouth at bedtime. ?  ?Trelegy Ellipta 100-62.5-25 MCG/ACT Aepb ?Generic drug: Fluticasone-Umeclidin-Vilant ?Inhale 1 puff into the lungs daily. ?  ?vitamin C 1000 MG tablet ?Take 500 mg by mouth daily. ?  ? ?  ? ?  ?  ? ? ?  ?Durable Medical Equipment  ?(From admission, onward)  ?  ? ? ?  ? ?  Start  Ordered  ? 05/21/21 1214  For home use only DME Nebulizer machine  Once       ?Question Answer Comment  ?Patient needs a nebulizer to treat with the following condition COPD exacerbation ([place])   ?Length of Need Lifetime   ?  ? 05/21/21 1213  ? ?  ?  ? ?  ? ? Follow-up Information   ? ? Olin Hauser, DO Follow up in 5 day(s).   ?Specialty: Family Medicine ?Why: appointment 05/23/21 3:00pm ?Contact information: ?[address] [place] [address] ?[phone] ? ? ?  ?  ? ? Minna Merritts, MD. Call in 1 week(s).   ?Specialty: Cardiology ?Why: one week appointment 5/ ?Contact information: ?[address] ?[place] [address] ?[phone] ? ? ?  ?  ? ?  ?  ? ?  ? ?Discharge Exam: ?Filed Weights  ? 05/17/21 0312  ?Weight: 97.4 kg  ? ?Physical Exam ?HENT:  ?   Head: Normocephalic.  ?   Mouth/Throat:  ?   Pharynx: No oropharyngeal exudate.  ?Eyes:  ?   General: Lids are normal.  ?   Conjunctiva/sclera: Conjunctivae normal.  ?Cardiovascular:  ?   Rate and Rhythm:  Normal rate and regular rhythm.  ?   Heart sounds: Normal heart sounds, S1 normal and S2 normal.  ?Pulmonary:  ?   Breath sounds: No transmitted upper airway sounds. No decreased breath sounds, wheezing, r

## 2021-05-21 NOTE — Progress Notes (Signed)
Physical Therapy Treatment ?Patient Details ?Name: Tangy Drozdowski Wimbish ?MRN: [id number] ?DOB: Jul 26, 1935 ?Today's Date: 05/21/2021 ? ? ?History of Present Illness Patient is an 86 year old female with a PMH (+) for COPD, history of SVT, sleep apnea, GERD who presents with shortness of breath. Patient does present with a productive cough w/ green sputum. ? ?  ?PT Comments  ? ? Pt pleasant and agreeable to PT session. Pt remains mod-I for transfers and ambulating within room without AD. Pt performing lap around nurses station with reciprocal gait with RW, able to ambulate at adequate speed, dual task with conversation and head turns with talking to PT. Pt demonstrates mild SOB at end of bout with education provided on PLB and rest breaks as needed. Pt appears largely at baseline with mobility despite minor SOB. SOB improving with standing rest in room at counter top. Pt left in room with all needs in reach standing at counter. Will continue to follow per POC but pt has d/c orders and is expected to d/c today. ?   ?Recommendations for follow up therapy are one component of a multi-disciplinary discharge planning process, led by the attending physician.  Recommendations may be updated based on patient status, additional functional criteria and insurance authorization. ? ?Follow Up Recommendations ? No PT follow up ?  ?  ?Assistance Recommended at Discharge PRN  ?Patient can return home with the following   ?  ?Equipment Recommendations ? None recommended by PT  ?  ?Recommendations for Other Services   ? ? ?  ?Precautions / Restrictions Precautions ?Precautions: Fall ?Restrictions ?Weight Bearing Restrictions: No  ?  ? ?Mobility ? Bed Mobility ?  ?  ?  ?  ?  ?  ?  ?General bed mobility comments: NT. In recliner prior to mobility. ?Patient Response: Cooperative ? ?Transfers ?Overall transfer level: Modified independent ?Equipment used: Rolling walker (2 wheels), None ?  ?  ?  ?  ?  ?  ?  ?General transfer comment: Stands without  AD, amb 5' to RW ?  ? ?Ambulation/Gait ?  ?Gait Distance (Feet): 200 Feet ?Assistive device: Rolling walker (2 wheels) ?Gait Pattern/deviations: Step-through pattern, Decreased step length - right, Decreased step length - left, Decreased stride length, Narrow base of support ?  ?  ?  ?General Gait Details: Ambulating single lap around nurses station on RA with mild SOB at end of bout ? ? ?Stairs ?  ?  ?  ?  ?  ? ? ?Wheelchair Mobility ?  ? ?Modified Rankin (Stroke Patients Only) ?  ? ? ?  ?Balance Overall balance assessment: Modified Independent ?  ?  ?  ?  ?  ?  ?  ?  ?  ?  ?  ?  ?  ?  ?  ?  ?  ?  ?  ? ?  ?Cognition Arousal/Alertness: Awake/alert ?Behavior During Therapy: [facility] for tasks assessed/performed ?Overall Cognitive Status: Within Functional Limits for tasks assessed ?  ?  ?  ?  ?  ?  ?  ?  ?  ?  ?  ?  ?  ?  ?  ?  ?  ?  ?  ? ?  ?Exercises Other Exercises ?Other Exercises: Education provided on PLB for improving SOB ? ?  ?General Comments   ?  ?  ? ?Pertinent Vitals/Pain Pain Assessment ?Pain Assessment: No/denies pain  ? ? ?Home Living   ?  ?  ?  ?  ?  ?  ?  ?  ?  ?   ?  ?  Prior Function    ?  ?  ?   ? ?PT Goals (current goals can now be found in the care plan section) Acute Rehab PT Goals ?Patient Stated Goal: to go home ?PT Goal Formulation: With patient ?Time For Goal Achievement: 06/01/21 ?Potential to Achieve Goals: Good ?Progress towards PT goals: Progressing toward goals ? ?  ?Frequency ? ? ? Min 2X/week ? ? ? ?  ?PT Plan Current plan remains appropriate  ? ? ?Co-evaluation   ?  ?  ?  ?  ? ?  ?AM-PAC PT "6 Clicks" Mobility   ?Outcome Measure ? Help needed turning from your back to your side while in a flat bed without using bedrails?: None ?Help needed moving from lying on your back to sitting on the side of a flat bed without using bedrails?: None ?Help needed moving to and from a bed to a chair (including a wheelchair)?: None ?Help needed standing up from a chair using your arms (e.g., wheelchair or  bedside chair)?: None ?Help needed to walk in hospital room?: A Little ?Help needed climbing 3-5 steps with a railing? : A Little ?6 Click Score: 22 ? ?  ?End of Session Equipment Utilized During Treatment: Gait belt ?Activity Tolerance: Patient tolerated treatment well ?Patient left:  (standing at sink) ?Nurse Communication: Mobility status ?PT Visit Diagnosis: Muscle weakness (generalized) (M62.81);Difficulty in walking, not elsewhere classified (R26.2) ?  ? ? ?Time: 1053-1101 ?PT Time Calculation (min) (ACUTE ONLY): 8 min ? ?Charges:  $Therapeutic Exercise: 8-22 mins          ?          ? ?Salem Caster. Fairly IV, PT, DPT ?Physical Therapist- [place]  ?[facility]  ?05/21/2021, 11:06 AM ? ?

## 2021-05-21 NOTE — Telephone Encounter (Signed)
Copied from [place]. Topic: General - Other ?>> May 21, 2021  2:53 PM Yvette Rack wrote: ?Reason for CRM: Pt daughter stated the Rx for Celexa needs to be increased and they were told to contact Dr. Raliegh Ip to request increase in the Rx if they felt it was needed. Pt has appt on 05/23/21 but daughter felt pt may forgive to request an increase in dosage ?

## 2021-05-21 NOTE — Care Management Important Message (Signed)
Important Message ? ?Patient Details  ?Name: Jenna [name] ?MRN: [id number] ?Date of Birth: 05/31/1935 ? ? ?Medicare Important Message Given:  Yes ? ? ? ? ?Jenna [name] ?05/21/2021, 11:53 AM ?

## 2021-05-22 ENCOUNTER — Telehealth: Payer: Self-pay

## 2021-05-22 NOTE — Telephone Encounter (Signed)
LVM to call back.

## 2021-05-23 ENCOUNTER — Inpatient Hospital Stay: Payer: Medicare Other | Admitting: Family Medicine

## 2021-05-25 ENCOUNTER — Encounter: Payer: Self-pay | Admitting: Family Medicine

## 2021-05-25 ENCOUNTER — Ambulatory Visit (INDEPENDENT_AMBULATORY_CARE_PROVIDER_SITE_OTHER): Payer: Medicare Other | Admitting: Family Medicine

## 2021-05-25 VITALS — BP 130/82 | HR 71 | Ht 63.0 in | Wt 214.0 lb

## 2021-05-25 DIAGNOSIS — F331 Major depressive disorder, recurrent, moderate: Secondary | ICD-10-CM

## 2021-05-25 DIAGNOSIS — F5104 Psychophysiologic insomnia: Secondary | ICD-10-CM

## 2021-05-25 DIAGNOSIS — R600 Localized edema: Secondary | ICD-10-CM | POA: Diagnosis not present

## 2021-05-25 DIAGNOSIS — N1831 Chronic kidney disease, stage 3a: Secondary | ICD-10-CM | POA: Diagnosis not present

## 2021-05-25 DIAGNOSIS — J441 Chronic obstructive pulmonary disease with (acute) exacerbation: Secondary | ICD-10-CM | POA: Diagnosis not present

## 2021-05-25 DIAGNOSIS — I4892 Unspecified atrial flutter: Secondary | ICD-10-CM | POA: Diagnosis not present

## 2021-05-25 MED ORDER — FUROSEMIDE 20 MG PO TABS: 20.0000 mg | ORAL_TABLET | Freq: Every day | ORAL | 1 refills | Status: DC | PRN

## 2021-05-25 MED ORDER — SERTRALINE HCL 100 MG PO TABS: 100.0000 mg | ORAL_TABLET | Freq: Every day | ORAL | 3 refills | Status: DC

## 2021-05-25 NOTE — Progress Notes (Signed)
? ?Subjective:  ? ? Patient ID: Jenna [name], female    DOB: 11-04-1935, 86 y.o.   MRN: [id number] ? ?Jenna [name] is a 86 y.o. female presenting on 05/25/2021 for Hospitalization Follow-up and COPD ? ? ?HPI ? ?HOSPITAL FOLLOW-UP VISIT ? ?Hospital/Location: [facility] ?Date of Admission: 05/17/21 ?Date of Discharge: 05/21/21 ?Transitions of care telephone call: Attempted on 05/22/21 ? ?Reason for Admission: COPD Exacerbation / Atrial Fibrillation Flutter RVR ? ?- Hospital H&P and Discharge Summary have been reviewed ?- Patient presents today 4 days after recent hospitalization. Brief summary of recent course, patient had symptoms of dyspnea, cough, wheezing, rapid heart rate atrial flutter, hospitalized, treated with breathing treatments, steroids IV course, antibiotic doxycycline. ? ?- Today reports overall has done well after discharge. Symptoms of breathing have improved. Using Albuterol regularly on nebulizer, needs order.  ? ?Has Dr Raul Del at [facility], next apt 6/28 ? ?Stressor bothering her more, interested to adjust dose increase Sertraline '50mg'$  up to '100mg'$  ? ?- New medications on discharge: see below ? ?- Changes to current meds on discharge: Discontinued Toprol XL '25mg'$  and switched to Cardizem 24 hr '180mg'$  daily, Discontinued Terbinafine ? ?Upcoming apt w [facility] 5/19 to help in home care PRN ? ?I have reviewed the discharge medication list, and have reconciled the current and discharge medications today. ? ? ?Current Outpatient Medications:  ?  albuterol (PROVENTIL) (2.5 MG/3ML) 0.083% nebulizer solution, Take 3 mLs (2.5 mg total) by nebulization every 6 (six) hours as needed for wheezing or shortness of breath., Disp: 360 mL, Rfl: 0 ?  Ascorbic Acid (VITAMIN C) 1000 MG tablet, Take 500 mg by mouth daily. , Disp: , Rfl:  ?  Cholecalciferol 25 MCG (1000 UT) tablet, Take 1,000 Units by mouth daily. , Disp: , Rfl:  ?  Cyanocobalamin 1000 MCG TBCR, Take 1,000 mcg by mouth daily. , Disp: , Rfl:  ?   diltiazem (CARDIZEM CD) 180 MG 24 hr capsule, Take 1 capsule (180 mg total) by mouth daily., Disp: 30 capsule, Rfl: 0 ?  famotidine (PEPCID) 20 MG tablet, Take 20 mg by mouth 2 (two) times daily., Disp: , Rfl:  ?  ferrous sulfate 325 (65 FE) MG EC tablet, Take 325 mg by mouth at bedtime. , Disp: , Rfl:  ?  fluconazole (DIFLUCAN) 50 MG tablet, Take 1 tablet (50 mg total) by mouth daily for 11 days., Disp: 11 tablet, Rfl: 0 ?  gabapentin (NEURONTIN) 100 MG capsule, Start 1 capsule daily, increase by 1 cap every 2-3 days as tolerated up to 3 times a day, or may take 3 at once in evening., Disp: 90 capsule, Rfl: 1 ?  methocarbamol (ROBAXIN) 500 MG tablet, Take 1 tablet (500 mg total) by mouth every 8 (eight) hours as needed for muscle spasms., Disp: 90 tablet, Rfl: 3 ?  Multiple Vitamins-Minerals (ONE-A-DAY WOMENS [employer]ITES) TABS, Take 2 tablets by mouth every morning., Disp: , Rfl:  ?  MYRBETRIQ 25 MG TB24 tablet, Take 1 tablet (25 mg total) by mouth daily., Disp: 90 tablet, Rfl: 1 ?  Omega-3 Fatty Acids (FISH OIL) 1000 MG CAPS, Take 1,000 mg by mouth daily., Disp: , Rfl:  ?  pantoprazole (PROTONIX) 40 MG tablet, Take 1 tablet (40 mg total) by mouth daily., Disp: 30 tablet, Rfl: 0 ?  simvastatin (ZOCOR) 40 MG tablet, Take 1 tablet (40 mg total) by mouth daily., Disp: 90 tablet, Rfl: 3 ?  traZODone (DESYREL) 100 MG tablet, Take 1 tablet (100  mg total) by mouth at bedtime., Disp: 90 tablet, Rfl: 3 ?  TRELEGY ELLIPTA 100-62.5-25 MCG/INH AEPB, Inhale 1 puff into the lungs daily., Disp: , Rfl:  ?  albuterol (VENTOLIN HFA) 108 (90 Base) MCG/ACT inhaler, Inhale 2 puffs into the lungs every 6 (six) hours as needed for wheezing or shortness of breath., Disp: 1 each, Rfl: 3 ?  furosemide (LASIX) 20 MG tablet, Take 1 tablet (20 mg total) by mouth daily as needed for fluid or edema. Take lasix daily, with extra dose as needed for swelling, Disp: 90 tablet, Rfl: 1 ?  sertraline (ZOLOFT) 100 MG tablet, Take 1 tablet (100 mg total) by  mouth daily., Disp: 90 tablet, Rfl: 3 ? ?------------------------------------------------------------------------- ?Social History  ? ?Tobacco Use  ? Smoking status: Former  ?  Packs/day: 1.00  ?  Years: 20.00  ?  Pack years: 20.00  ?  Types: Cigarettes  ?  Quit date: 07/1981  ?  Years since quitting: 39.8  ? Smokeless tobacco: Former  ?Vaping Use  ? Vaping Use: Never used  ?Substance Use Topics  ? Alcohol use: Not Currently  ?  Comment: past  ? Drug use: No  ? ? ?Review of Systems ?Per HPI unless specifically indicated above ? ?   ?Objective:  ?  ?BP 130/82   Pulse 71   Ht '5\' 3"'$  (1.6 m)   Wt 214 lb (97.1 kg)   SpO2 98%   BMI 37.91 kg/m?   ?Wt Readings from Last 3 Encounters:  ?05/25/21 214 lb (97.1 kg)  ?05/17/21 214 lb 12.8 oz (97.4 kg)  ?05/15/21 214 lb 12.8 oz (97.4 kg)  ?  ?Physical Exam ?Vitals and nursing note reviewed.  ?Constitutional:   ?   General: She is not in acute distress. ?   Appearance: She is well-developed. She is obese. She is not diaphoretic.  ?   Comments: Well-appearing, comfortable, cooperative  ?HENT:  ?   Head: Normocephalic and atraumatic.  ?Eyes:  ?   General:     ?   Right eye: No discharge.     ?   Left eye: No discharge.  ?   Conjunctiva/sclera: Conjunctivae normal.  ?Neck:  ?   Thyroid: No thyromegaly.  ?Cardiovascular:  ?   Rate and Rhythm: Normal rate and regular rhythm.  ?   Heart sounds: Normal heart sounds. No murmur heard. ?Pulmonary:  ?   Effort: Pulmonary effort is normal. No respiratory distress.  ?   Breath sounds: No wheezing (no wheezing today) or rales.  ?   Comments: Occasional cough ?Musculoskeletal:     ?   General: Normal range of motion.  ?   Cervical back: Normal range of motion and neck supple.  ?   Right lower leg: Edema present.  ?   Left lower leg: Edema present.  ?Lymphadenopathy:  ?   Cervical: No cervical adenopathy.  ?Skin: ?   General: Skin is warm and dry.  ?   Findings: No erythema or rash.  ?Neurological:  ?   Mental Status: She is alert and  oriented to person, place, and time.  ?Psychiatric:     ?   Behavior: Behavior normal.  ?   Comments: Well groomed, good eye contact, normal speech and thoughts  ? ? ? ? ?Results for orders placed or performed during the hospital encounter of 05/17/21  ?Resp Panel by RT-PCR (Flu A&B, Covid) Nasopharyngeal Swab  ? Specimen: Nasopharyngeal Swab; Nasopharyngeal(NP) swabs in vial transport medium  ?Result  Value Ref Range  ? SARS Coronavirus 2 by RT PCR NEGATIVE NEGATIVE  ? Influenza A by PCR NEGATIVE NEGATIVE  ? Influenza B by PCR NEGATIVE NEGATIVE  ?Comprehensive metabolic panel  ?Result Value Ref Range  ? Sodium 137 135 - 145 mmol/L  ? Potassium 3.8 3.5 - 5.1 mmol/L  ? Chloride 103 98 - 111 mmol/L  ? CO2 24 22 - 32 mmol/L  ? Glucose, Bld 205 (H) 70 - 99 mg/dL  ? BUN 10 8 - 23 mg/dL  ? Creatinine, Ser 0.96 0.44 - 1.00 mg/dL  ? Calcium 9.4 8.9 - 10.3 mg/dL  ? Total Protein 6.8 6.5 - 8.1 g/dL  ? Albumin 3.9 3.5 - 5.0 g/dL  ? AST 29 15 - 41 U/L  ? ALT 20 0 - 44 U/L  ? Alkaline Phosphatase 68 38 - 126 U/L  ? Total Bilirubin 0.8 0.3 - 1.2 mg/dL  ? GFR, Estimated 59 (L) >60 mL/min  ? Anion gap 10 5 - 15  ?CBC with Differential  ?Result Value Ref Range  ? WBC 12.3 (H) 4.0 - 10.5 K/uL  ? RBC 3.79 (L) 3.87 - 5.11 MIL/uL  ? Hemoglobin 12.2 12.0 - 15.0 g/dL  ? HCT 37.3 36.0 - 46.0 %  ? MCV 98.4 80.0 - 100.0 fL  ? MCH 32.2 26.0 - 34.0 pg  ? MCHC 32.7 30.0 - 36.0 g/dL  ? RDW 12.7 11.5 - 15.5 %  ? Platelets 208 150 - 400 K/uL  ? nRBC 0.0 0.0 - 0.2 %  ? Neutrophils Relative % 73 %  ? Neutro Abs 9.1 (H) 1.7 - 7.7 K/uL  ? Lymphocytes Relative 15 %  ? Lymphs Abs 1.8 0.7 - 4.0 K/uL  ? Monocytes Relative 10 %  ? Monocytes Absolute 1.2 (H) 0.1 - 1.0 K/uL  ? Eosinophils Relative 1 %  ? Eosinophils Absolute 0.1 0.0 - 0.5 K/uL  ? Basophils Relative 1 %  ? Basophils Absolute 0.1 0.0 - 0.1 K/uL  ? Immature Granulocytes 0 %  ? Abs Immature Granulocytes 0.05 0.00 - 0.07 K/uL  ?Brain natriuretic peptide  ?Result Value Ref Range  ? B Natriuretic  Peptide 72.3 0.0 - 100.0 pg/mL  ?Blood gas, venous  ?Result Value Ref Range  ? pH, Ven 7.44 (H) 7.25 - 7.43  ? pCO2, Ven 41 (L) 44 - 60 mmHg  ? pO2, Ven 74 (H) 32 - 45 mmHg  ? Bicarbonate 27.8 20.0 - 28.0 mmol/L

## 2021-05-25 NOTE — Patient Instructions (Addendum)
Thank you for coming to the office today. ? ?Lab today to check Kidney function after hospital. ? ?Keep up with [facility] I think that is a great option. ? ?Ordered more Albuterol nebulizer ? ?Keep apt with Dr Raul Del 6/28, but may want to notify them to move it up sooner. ? ?Lungs clear today but still reduced air movement. ? ?Increase dose Sertraline from 50 to '100mg'$  daily/. New order sent. ? ?Re ordered Furosemide '20mg'$  daily PRN ? ?Please schedule a Follow-up Appointment to: Return if symptoms worsen or fail to improve. ? ?If you have any other questions or concerns, please feel free to call the office or send a message through [place]. You may also schedule an earlier appointment if necessary. ? ?Additionally, you may be receiving a survey about your experience at our office within a few days to 1 week by e-mail or mail. We value your feedback. ? ?Nobie Putnam, DO ?[place] ?

## 2021-05-26 LAB — BASIC METABOLIC PANEL WITH GFR
BUN/Creatinine Ratio: 15 (calc) (ref 6–22)
BUN: 17 mg/dL (ref 7–25)
CO2: 24 mmol/L (ref 20–32)
Calcium: 8.9 mg/dL (ref 8.6–10.4)
Chloride: 100 mmol/L (ref 98–110)
Creat: 1.14 mg/dL — ABNORMAL HIGH (ref 0.60–0.95)
Glucose, Bld: 295 mg/dL — ABNORMAL HIGH (ref 65–139)
Potassium: 3.6 mmol/L (ref 3.5–5.3)
Sodium: 138 mmol/L (ref 135–146)
eGFR: 47 mL/min/{1.73_m2} — ABNORMAL LOW (ref >=60)

## 2021-05-30 ENCOUNTER — Telehealth: Payer: Self-pay | Admitting: Family Medicine

## 2021-05-30 DIAGNOSIS — M159 Polyosteoarthritis, unspecified: Secondary | ICD-10-CM

## 2021-05-30 DIAGNOSIS — G8929 Other chronic pain: Secondary | ICD-10-CM

## 2021-05-30 NOTE — Telephone Encounter (Signed)
gabapentin (NEURONTIN) 100 MG capsule 90 capsule 1 04/06/2021    ?Sig: Start 1 capsule daily, increase by 1 cap every 2-3 days as tolerated up to 3 times a day, or may take 3 at once in evening.   ?Sent to pharmacy as: gabapentin (NEURONTIN) 100 MG capsule   ?Pharmacy has called and wife also wants to see if Dr will prescribe 300 mg once at nite vs 3 capsules. Resend script if approved.  ?[address], [place], [place]  ?[place] [address]  ?Phone: [phone] Fax: [phone]  ? ?

## 2021-05-31 MED ORDER — GABAPENTIN 300 MG PO CAPS: 300.0000 mg | ORAL_CAPSULE | Freq: Every day | ORAL | 3 refills | Status: DC

## 2021-06-05 DIAGNOSIS — M1990 Unspecified osteoarthritis, unspecified site: Secondary | ICD-10-CM | POA: Diagnosis not present

## 2021-06-05 DIAGNOSIS — J449 Chronic obstructive pulmonary disease, unspecified: Secondary | ICD-10-CM | POA: Diagnosis not present

## 2021-06-05 DIAGNOSIS — R296 Repeated falls: Secondary | ICD-10-CM | POA: Diagnosis not present

## 2021-06-12 DIAGNOSIS — H35352 Cystoid macular degeneration, left eye: Secondary | ICD-10-CM | POA: Diagnosis not present

## 2021-06-12 LAB — HM DIABETES EYE EXAM

## 2021-06-25 ENCOUNTER — Other Ambulatory Visit: Payer: Self-pay

## 2021-06-25 MED ORDER — PANTOPRAZOLE SODIUM 40 MG PO TBEC: 40.0000 mg | DELAYED_RELEASE_TABLET | Freq: Every day | ORAL | 3 refills | Status: DC

## 2021-07-09 ENCOUNTER — Ambulatory Visit: Payer: Medicare Other | Admitting: Family Medicine

## 2021-07-11 ENCOUNTER — Encounter: Payer: Self-pay | Admitting: Family Medicine

## 2021-07-11 ENCOUNTER — Ambulatory Visit (INDEPENDENT_AMBULATORY_CARE_PROVIDER_SITE_OTHER): Payer: Medicare HMO | Admitting: Family Medicine

## 2021-07-11 VITALS — BP 133/64 | HR 81 | Temp 96.6°F | Wt 203.0 lb

## 2021-07-11 DIAGNOSIS — R6889 Other general symptoms and signs: Secondary | ICD-10-CM

## 2021-07-11 DIAGNOSIS — R413 Other amnesia: Secondary | ICD-10-CM

## 2021-07-11 DIAGNOSIS — F331 Major depressive disorder, recurrent, moderate: Secondary | ICD-10-CM | POA: Diagnosis not present

## 2021-07-11 DIAGNOSIS — J432 Centrilobular emphysema: Secondary | ICD-10-CM

## 2021-07-11 DIAGNOSIS — M85852 Other specified disorders of bone density and structure, left thigh: Secondary | ICD-10-CM | POA: Diagnosis not present

## 2021-07-11 DIAGNOSIS — F5104 Psychophysiologic insomnia: Secondary | ICD-10-CM

## 2021-07-11 DIAGNOSIS — E1121 Type 2 diabetes mellitus with diabetic nephropathy: Secondary | ICD-10-CM | POA: Diagnosis not present

## 2021-07-11 DIAGNOSIS — F419 Anxiety disorder, unspecified: Secondary | ICD-10-CM

## 2021-07-11 LAB — POCT GLYCOSYLATED HEMOGLOBIN (HGB A1C): Hemoglobin A1C: 7.2 % — AB (ref 4.0–5.6)

## 2021-07-11 MED ORDER — ALENDRONATE SODIUM 70 MG PO TABS: 70.0000 mg | ORAL_TABLET | ORAL | 3 refills | Status: DC

## 2021-07-11 MED ORDER — BUSPIRONE HCL 5 MG PO TABS: 5.0000 mg | ORAL_TABLET | Freq: Three times a day (TID) | ORAL | 1 refills | Status: DC | PRN

## 2021-07-11 NOTE — Progress Notes (Signed)
Subjective:    Patient ID: Jenna [name], female    DOB: [birth year], 86 y.o.   MRN: [id number]  Jenna [name] is a 86 y.o. female presenting on 07/11/2021 for No chief complaint on file.   HPI  Emphysema / COPD Followed by Dr Raul Del Improved overall currently.  AFlutter w/ RVR - improved HR has been controlled on current dosing. Followed by Cardiology has had ECHO  Type 2 DM Controlled Last A1c 6.4, now up to 7.2 had steroid course for COPD recently in past 2-3 months.  Neck Strain Spasm due to sleeping on non supportive pillow  Osteopenia, hip Needs re order on Alendronate/Fosamax '70mg'$  weekly  Forgetfulness / Memory Loss Recurrent Depression  PHQ9 Insomnia Admits occasional episode of forgetting location of grocery store and where she was going. But usually has no issues with cognitive function Asking about Prevagen supplement. She does puzzles, solitaire     07/11/2021    5:14 PM 04/06/2021    5:49 PM 03/07/2021   11:19 AM  Depression screen PHQ 2/9  Decreased Interest '2 2 1  '$ Down, Depressed, Hopeless 1 1 0  PHQ - 2 Score '3 3 1  '$ Altered sleeping 2 2 0  Tired, decreased energy '1 1 2  '$ Change in appetite  0 1  Feeling bad or failure about yourself  0 0 0  Trouble concentrating 0 0 0  Moving slowly or fidgety/restless 0 0 0  Suicidal thoughts 0 0 0  PHQ-9 Score '6 6 4  '$ Difficult doing work/chores Somewhat difficult Somewhat difficult Not difficult at all    Social History   Tobacco Use   Smoking status: Former    Packs/day: 1.00    Years: 20.00    Total pack years: 20.00    Types: Cigarettes    Quit date: 07/1981    Years since quitting: 40.0   Smokeless tobacco: Former  Scientific laboratory technician Use: Never used  Substance Use Topics   Alcohol use: Not Currently    Comment: past   Drug use: No    Review of Systems Per HPI unless specifically indicated above     Objective:    BP 133/64 (BP Location: Left Wrist, Patient Position: Sitting, Cuff  Size: Normal)   Pulse 81   Temp (!) 96.6 F (35.9 C) (Temporal)   Wt 203 lb (92.1 kg)   SpO2 99%   BMI 35.96 kg/m   Wt Readings from Last 3 Encounters:  07/11/21 203 lb (92.1 kg)  05/25/21 214 lb (97.1 kg)  05/17/21 214 lb 12.8 oz (97.4 kg)    Physical Exam Vitals and nursing note reviewed.  Constitutional:      General: She is not in acute distress.    Appearance: She is well-developed. She is obese. She is not diaphoretic.     Comments: Well-appearing, comfortable, cooperative  HENT:     Head: Normocephalic and atraumatic.  Eyes:     General:        Right eye: No discharge.        Left eye: No discharge.     Conjunctiva/sclera: Conjunctivae normal.  Neck:     Thyroid: No thyromegaly.  Cardiovascular:     Rate and Rhythm: Normal rate and regular rhythm.     Heart sounds: Normal heart sounds. No murmur heard. Pulmonary:     Effort: Pulmonary effort is normal. No respiratory distress.     Breath sounds: Normal breath sounds. No wheezing or rales.  Musculoskeletal:  General: Normal range of motion.     Cervical back: Normal range of motion and neck supple.     Comments: wheelchair  Lymphadenopathy:     Cervical: No cervical adenopathy.  Skin:    General: Skin is warm and dry.     Findings: No erythema or rash.  Neurological:     Mental Status: She is alert and oriented to person, place, and time.  Psychiatric:        Behavior: Behavior normal.     Comments: Well groomed, good eye contact, normal speech and thoughts     Recent Labs    08/02/20 0000 02/05/21 0811 07/11/21 1542  HGBA1C 6.6* 6.4* 7.2*     Results for orders placed or performed in visit on 07/11/21  POCT glycosylated hemoglobin (Hb A1C)  Result Value Ref Range   Hemoglobin A1C 7.2 (A) 4.0 - 5.6 %      Assessment & Plan:   Problem List Items Addressed This Visit     Psychophysiological insomnia   Depression, major, recurrent (HCC)   Relevant Medications   busPIRone (BUSPAR) 5 MG  tablet   Other Visit Diagnoses     Controlled type 2 diabetes mellitus with diabetic nephropathy, without long-term current use of insulin (HCC)    -  Primary   Relevant Orders   POCT glycosylated hemoglobin (Hb A1C) (Completed)   Osteopenia of left hip       Relevant Medications   alendronate (FOSAMAX) 70 MG tablet   Centrilobular emphysema (HCC)       Memory loss       Forgetfulness       Anxiety       Relevant Medications   busPIRone (BUSPAR) 5 MG tablet       POC A1c 7.2, stable within range < 8 A1c based on age and comorbid factors. Continue current therapy lifestyle management.  Osteopenia Reorder Alendronate  Discussed difference between functional memory loss / forgetfulness and cognitive impairment  Try Prevagen for memory loss / forgetfulness, usually a 3 month trial is recommended.  Recurrent Depression Improved on therapy  Buspar '5mg'$  only as needed for nerves anxiety, can take 1-2 per dose, limit max is 3 per day, caution with too much risk of dizziness.  Goal to improve activity and walking if possible.  Keep working on the brain games. For memory  Meds ordered this encounter  Medications   alendronate (FOSAMAX) 70 MG tablet    Sig: Take 1 tablet (70 mg total) by mouth once a week. Take with a full glass of water on an empty stomach.    Dispense:  12 tablet    Refill:  3   busPIRone (BUSPAR) 5 MG tablet    Sig: Take 1 tablet (5 mg total) by mouth 3 (three) times daily as needed (anxiety).    Dispense:  90 tablet    Refill:  1      Follow up plan: Return in about 4 months (around 11/10/2021) for 4 month follow-up DM A1c, Memory Loss.  Jenna [name], [facility] 07/11/2021, 3:32 PM

## 2021-07-11 NOTE — Patient Instructions (Addendum)
Thank you for coming to the office today.  Try Prevagen for memory loss / forgetfulness, usually a 3 month trial is recommended.  Buspar '5mg'$  only as needed for nerves anxiety, can take 1-2 per dose, limit max is 3 per day, caution with too much.  Goal to improve activity and walking if possible.  Keep working on the brain games. For memory   Please schedule a Follow-up Appointment to: Return in about 4 months (around 11/10/2021) for 4 month follow-up DM A1c, Memory Loss.  If you have any other questions or concerns, please feel free to call the office or send a message through [place]. You may also schedule an earlier appointment if necessary.  Additionally, you may be receiving a survey about your experience at our office within a few days to 1 week by e-mail or mail. We value your feedback.  Nobie Putnam, DO [place]

## 2021-07-21 DIAGNOSIS — J449 Chronic obstructive pulmonary disease, unspecified: Secondary | ICD-10-CM | POA: Diagnosis not present

## 2021-07-30 ENCOUNTER — Ambulatory Visit: Payer: Medicare Other

## 2021-07-31 ENCOUNTER — Ambulatory Visit: Payer: Medicare Other

## 2021-08-03 ENCOUNTER — Ambulatory Visit: Payer: Medicare Other

## 2021-08-21 ENCOUNTER — Other Ambulatory Visit: Payer: Self-pay

## 2021-08-21 DIAGNOSIS — J449 Chronic obstructive pulmonary disease, unspecified: Secondary | ICD-10-CM | POA: Diagnosis not present

## 2021-08-21 DIAGNOSIS — N3946 Mixed incontinence: Secondary | ICD-10-CM

## 2021-08-21 MED ORDER — MYRBETRIQ 25 MG PO TB24: 25.0000 mg | ORAL_TABLET | Freq: Every day | ORAL | 1 refills | Status: DC

## 2021-08-28 DIAGNOSIS — L280 Lichen simplex chronicus: Secondary | ICD-10-CM | POA: Diagnosis not present

## 2021-08-28 DIAGNOSIS — L570 Actinic keratosis: Secondary | ICD-10-CM | POA: Diagnosis not present

## 2021-08-28 DIAGNOSIS — D1739 Benign lipomatous neoplasm of skin and subcutaneous tissue of other sites: Secondary | ICD-10-CM | POA: Diagnosis not present

## 2021-08-29 ENCOUNTER — Telehealth: Payer: Self-pay | Admitting: Cardiovascular Disease

## 2021-08-29 NOTE — Telephone Encounter (Signed)
Call attempted to ask patient which pharmacy she would like Rx sent to. (Directions for med listed instead of pharmacy.) Left v/m message.

## 2021-08-29 NOTE — Telephone Encounter (Signed)
*  STAT* If patient is at the pharmacy, call can be transferred to refill team.   1. Which medications need to be refilled? (please list name of each medication and dose if known) diltiazem (CARDIZEM CD) 180 MG 24 hr capsule  2. Which pharmacy/location (including street and city if local pharmacy) is medication to be sent to? Take 1 capsule (180 mg total) by mouth daily.  3. Do they need a 30 day or 90 day supply? [place]

## 2021-09-03 MED ORDER — DILTIAZEM HCL ER COATED BEADS 180 MG PO CP24: 180.0000 mg | ORAL_CAPSULE | Freq: Every day | ORAL | 0 refills | Status: DC

## 2021-09-03 NOTE — Telephone Encounter (Signed)
Requested Prescriptions   Signed Prescriptions Disp Refills   diltiazem (CARDIZEM CD) 180 MG 24 hr capsule 90 capsule 0    Sig: Take 1 capsule (180 mg total) by mouth daily.    Authorizing Provider: Minna Merritts    Ordering User: Raelene Bott, Lillah Standre L

## 2021-09-21 DIAGNOSIS — J449 Chronic obstructive pulmonary disease, unspecified: Secondary | ICD-10-CM | POA: Diagnosis not present

## 2021-10-04 ENCOUNTER — Telehealth: Payer: Self-pay | Admitting: Family Medicine

## 2021-10-04 NOTE — Telephone Encounter (Signed)
N/A unable to leave a message for patient to call back and schedule the Medicare Annual Wellness Visit (AWV) virtually or by telephone.  Last AWV 07/18/20  Please schedule at anytime with [place].   Any questions, please call me at [phone]

## 2021-10-15 ENCOUNTER — Ambulatory Visit (INDEPENDENT_AMBULATORY_CARE_PROVIDER_SITE_OTHER): Payer: Medicare Other

## 2021-10-15 VITALS — BP 138/59 | HR 76 | Temp 98.2°F | Resp 18 | Ht 61.0 in | Wt 206.6 lb

## 2021-10-15 DIAGNOSIS — Z0000 Encounter for general adult medical examination without abnormal findings: Secondary | ICD-10-CM

## 2021-10-15 DIAGNOSIS — Z23 Encounter for immunization: Secondary | ICD-10-CM | POA: Diagnosis not present

## 2021-10-15 NOTE — Patient Instructions (Signed)

## 2021-10-15 NOTE — Progress Notes (Signed)
Subjective:   Jenna [name] is a 86 y.o. female who presents for Medicare Annual (Subsequent) preventive examination.  Review of Systems    Per HPI unless specifically indicated below.  Cardiac Risk Factors include: advanced age (>30mn, >>32women);female gender, diabetic retinopathy obesity and hyperlipidemia.          Objective:    Today's Vitals   10/15/21 1522 10/15/21 1550  BP: (!) 149/57 (!) 138/59  Pulse: 76   Resp: 18   Temp: 98.2 F (36.8 C)   TempSrc: Oral   SpO2: 97%   Weight: 206 lb 9.6 oz (93.7 kg)   Height: '5\' 1"'$  (1.549 m)   PainSc: 0-No pain    Body mass index is 39.04 kg/m.     05/17/2021   10:00 AM 05/17/2021    2:18 AM 03/01/2021    6:02 PM 03/01/2021    5:51 PM 03/01/2021    5:41 PM 07/18/2020    1:24 PM 04/22/2020   12:32 PM  Advanced Directives  Does Patient Have a Medical Advance Directive? No No   Yes No No  Does patient want to make changes to medical advance directive?   No - Patient declined No - Patient declined No - Patient declined    Would patient like information on creating a medical advance directive? No - Patient declined      No - Patient declined    Current Medications (verified) Outpatient Encounter Medications as of 10/15/2021  Medication Sig   albuterol (PROVENTIL) (2.5 MG/3ML) 0.083% nebulizer solution Take 3 mLs (2.5 mg total) by nebulization every 6 (six) hours as needed for wheezing or shortness of breath.   alendronate (FOSAMAX) 70 MG tablet Take 1 tablet (70 mg total) by mouth once a week. Take with a full glass of water on an empty stomach.   Ascorbic Acid (VITAMIN C) 1000 MG tablet Take 500 mg by mouth daily.    busPIRone (BUSPAR) 5 MG tablet Take 1 tablet (5 mg total) by mouth 3 (three) times daily as needed (anxiety).   Cholecalciferol 25 MCG (1000 UT) tablet Take 1,000 Units by mouth daily.    Cyanocobalamin 1000 MCG TBCR Take 1,000 mcg by mouth daily.    diltiazem (CARDIZEM CD) 180 MG 24 hr capsule Take 1 capsule (180  mg total) by mouth daily.   famotidine (PEPCID) 20 MG tablet Take 20 mg by mouth 2 (two) times daily.   ferrous sulfate 325 (65 FE) MG EC tablet Take 325 mg by mouth at bedtime.    furosemide (LASIX) 20 MG tablet Take 1 tablet (20 mg total) by mouth daily as needed for fluid or edema. Take lasix daily, with extra dose as needed for swelling   gabapentin (NEURONTIN) 300 MG capsule Take 1 capsule (300 mg total) by mouth at bedtime.   methocarbamol (ROBAXIN) 500 MG tablet Take 1 tablet (500 mg total) by mouth every 8 (eight) hours as needed for muscle spasms.   Multiple Vitamins-Minerals (ONE-A-DAY WOMENS PETITES) TABS Take 2 tablets by mouth every morning.   MYRBETRIQ 25 MG TB24 tablet Take 1 tablet (25 mg total) by mouth daily.   Omega-3 Fatty Acids (FISH OIL) 1000 MG CAPS Take 1,000 mg by mouth daily.   pantoprazole (PROTONIX) 40 MG tablet Take 1 tablet (40 mg total) by mouth daily.   sertraline (ZOLOFT) 100 MG tablet Take 1 tablet (100 mg total) by mouth daily.   simvastatin (ZOCOR) 40 MG tablet Take 1 tablet (40 mg total) by  mouth daily.   SUPER B COMPLEX/C PO Take by mouth.   traZODone (DESYREL) 100 MG tablet Take 1 tablet (100 mg total) by mouth at bedtime.   TRELEGY ELLIPTA 100-62.5-25 MCG/INH AEPB Inhale 1 puff into the lungs daily.   albuterol (VENTOLIN HFA) 108 (90 Base) MCG/ACT inhaler Inhale 2 puffs into the lungs every 6 (six) hours as needed for wheezing or shortness of breath.   No facility-administered encounter medications on file as of 10/15/2021.    Allergies (verified) Azithromycin, Hydromorphone, Morphine, Nsaids, Oxycodone-acetaminophen, Codeine, Nabumetone, Oxycodone, Oxycodone-acetaminophen, and Promethazine hcl   History: Past Medical History:  Diagnosis Date   Anxiety    Arthritis    neck, knees(before replacements)   Asthma    CKD (chronic kidney disease), stage III (HCC)    COPD (chronic obstructive pulmonary disease) ([place])    Encounter for colonoscopy due to  history of adenomatous colonic polyps    GERD (gastroesophageal reflux disease)    Hearing loss of both ears    History of echocardiogram    a. 10/2013 Echo: EF 60-65%, no rwma, midlly dil LA. Rnl RV fxn.   Hyperlipidemia    Osteoporosis    Palpitations    PSVT (paroxysmal supraventricular tachycardia) ([place])    a. 05/2018 [facility]: 99 SVT runs. Fastest 218 x 4:30. Longest 4:38 w/ rate of 172.   Sleep apnea    resolved with gastric bypass   Syncope and collapse    Urine incontinence    Past Surgical History:  Procedure Laterality Date   BROW LIFT Bilateral 06/27/2015   Procedure: BLEPHAROPLASTY BILATERAL UPPER EYELIDS BILATERAL BLEPHAROTOSIS EYELIDS;  Surgeon: Karle Starch, MD;  Location: [facility];  Service: Ophthalmology;  Laterality: Bilateral;  BILATERAL   CATARACT EXTRACTION W/PHACO Right 02/13/2015   Procedure: CATARACT EXTRACTION PHACO AND INTRAOCULAR LENS PLACEMENT (IOC);  Surgeon: Ronnell Freshwater, MD;  Location: [facility];  Service: Ophthalmology;  Laterality: Right;   CATARACT EXTRACTION W/PHACO Left 03/22/2015   Procedure: CATARACT EXTRACTION PHACO AND INTRAOCULAR LENS PLACEMENT (IOC);  Surgeon: Ronnell Freshwater, MD;  Location: [facility];  Service: Ophthalmology;  Laterality: Left;  Tensed   COLECTOMY     COSMETIC SURGERY  2012   tummy tuck and excess skin removal   GASTRIC BYPASS  2010   REPLACEMENT TOTAL KNEE BILATERAL  1998   SHOULDER SURGERY  2009   left    TONSILLECTOMY     TOTAL ABDOMINAL HYSTERECTOMY  1979   Family History  Problem Relation Age of Onset   Heart attack Mother    Hypertension Mother    Diabetes type II Mother    Pneumonia Father    Skin cancer Father    Diabetes type II Father    Colon cancer Sister    Ovarian cancer Sister    Heart attack Brother    Heart attack Sister 63   Hyperlipidemia Sister    Hypertension Sister    Social History   Socioeconomic History   Marital  status: Married    Spouse name: Alferd Wince   Number of children: 7   Years of education: Teacher, English as a foreign language    Highest education level: High school graduate  Occupational History   Not on file  Tobacco Use   Smoking status: Former    Packs/day: 1.00    Years: 20.00    Total pack years: 20.00    Types: Cigarettes    Quit date: 07/1981  Years since quitting: 40.2   Smokeless tobacco: Former  Scientific laboratory technician Use: Never used  Substance and Sexual Activity   Alcohol use: Not Currently    Comment: past   Drug use: No   Sexual activity: Not on file  Other Topics Concern   Not on file  Social History Narrative   Not on file   Social Determinants of Health   Financial Resource Strain: Low Risk  (10/15/2021)   Overall Financial Resource Strain (CARDIA)    Difficulty of Paying Living Expenses: Not hard at all  Food Insecurity: No Food Insecurity (10/15/2021)   Hunger Vital Sign    Worried About Running Out of Food in the Last Year: Never true    Ran Out of Food in the Last Year: Never true  Transportation Needs: No Transportation Needs (10/15/2021)   PRAPARE - Hydrologist (Medical): No    Lack of Transportation (Non-Medical): No  Physical Activity: Insufficiently Active (10/15/2021)   Exercise Vital Sign    Days of Exercise per Week: 2 days    Minutes of Exercise per Session: 10 min  Stress: Stress Concern Present (10/15/2021)   [place]    Feeling of Stress : Rather much  Social Connections: Moderately Integrated (10/15/2021)   Social Connection and Isolation Panel [NHANES]    Frequency of Communication with Friends and Family: More than three times a week    Frequency of Social Gatherings with Friends and Family: More than three times a week    Attends Religious Services: 1 to 4 times per year    Active Member of [employer] or Organizations: No    Attends Theatre manager Meetings: Never    Marital Status: Married    Tobacco Counseling Counseling given: No Not applicable. The pt quit smoking 07/1981   Clinical Intake:     Pain : No/denies pain Pain Score: 0-No pain     Nutritional Status: BMI > 30  Obese Nutritional Risks: None Diabetes: Yes CBG done?: No Did pt. bring in CBG monitor from home?: No  How often do you need to have someone help you when you read instructions, pamphlets, or other written materials from your doctor or pharmacy?: 1 - Never  DiabeticNutrition Risk Assessment:  Has the patient had any N/V/D within the last 2 months?  No  Does the patient have any non-healing wounds?  No  Has the patient had any unintentional weight loss or weight gain?  No   Diabetes:  Is the patient diabetic?  Yes  If diabetic, was a CBG obtained today?  No  Did the patient bring in their glucometer from home?  No  How often do you monitor your CBG's? Never.   Financial Strains and Diabetes Management:  Are you having any financial strains with the device, your supplies or your medication? No .  Does the patient want to be seen by Chronic Care Management for management of their diabetes?  No  Would the patient like to be referred to a Nutritionist or for Diabetic Management?  No   Diabetic Exams:  Diabetic Eye Exam: Completed [place]  Diabetic Foot Exam: Completed 08/09/2020       Information entered by :: Donnie Mesa, [place]   Activities of Daily Living    10/15/2021    3:15 PM 05/17/2021    7:35 PM  In your present state of health, do you have  any difficulty performing the following activities:  Hearing? 1   Vision? 1   Difficulty concentrating or making decisions? 1   Walking or climbing stairs? 1   Dressing or bathing? 1   Doing errands, shopping? 0 0    Patient Care Team: Olin Hauser, DO as PCP - General (Family Medicine) Rockey Situ Kathlene November, MD as PCP - Cardiology  (Cardiology) Vanita Ingles, RN as Case Manager (General Practice)  Indicate any recent Medical Services you may have received from other than [facility] providers in the past year (date may be approximate).    The pt was [facility] to [facility] on [facility]17[facility]2021 for a COPD with acute exacerbation.  Assessment:   This is a routine wellness examination for Jenna [name].  Hearing/Vision screen Denies any hearing issues. Denies any vision issues. Wear glasses.Annual [place].   Dietary issues and exercise activities discussed: Current Exercise Habits: Structured exercise class, Type of exercise: stretching;strength training/weights, Time (Minutes): 10, Frequency (Times/Week): 3, Weekly Exercise (Minutes/Week): 30, Intensity: Mild, Exercise limited by: orthopedic condition(s)   Goals Addressed             This Visit's Progress    Stay Active and Independent-Low Back Pain       Why is this important?   Regular activity or exercise is important to managing back pain.  Activity helps to keep your muscles strong.  You will sleep better and feel more relaxed.  You will have more energy and feel less stressed.  If you are not active now, start slowly. Little changes make a big difference.  Rest, but not too much.  Stay as active as you can and listen to your body's signals.         Depression Screen    10/15/2021    3:17 PM 07/11/2021    5:14 PM 04/06/2021    5:49 PM 03/07/2021   11:19 AM 02/12/2021    2:57 PM 10/02/2020    2:02 PM 07/18/2020    1:28 PM  PHQ 2/9 Scores  PHQ - 2 Score '3 3 3 1 3 '$ 0 0  PHQ- 9 Score '4 6 6 4 7      '$ Fall Risk    10/15/2021    3:13 PM 03/07/2021   11:19 AM 02/12/2021    2:30 PM 08/09/2020    1:49 PM 07/18/2020    1:25 PM  Fall Risk   Falls in the past year? 1 0 0 0 1  Comment     tripped over dog, lost balance  Number falls in past yr: 1 0 0 0 1  Injury with Fall? 0 0 0 0 0  Risk for fall due to : History of fall(s);Impaired balance/gait;Impaired  mobility;Orthopedic patient Impaired mobility Impaired mobility  Medication side effect  Follow up Falls evaluation completed Falls evaluation completed Falls evaluation completed Falls evaluation completed Falls evaluation completed;Education provided;Falls prevention discussed    FALL RISK PREVENTION PERTAINING TO THE HOME:  Any stairs in or around the home? No  If so, are there any without handrails? No  Home free of loose throw rugs in walkways, pet beds, electrical cords, etc? Yes  Adequate lighting in your home to reduce risk of falls? Yes   ASSISTIVE DEVICES UTILIZED TO PREVENT FALLS:  Life alert? No  Use of a cane, walker or w/c? Yes  Grab bars in the bathroom? Yes  Shower chair or bench in shower? Yes  Elevated toilet seat or a handicapped toilet? Yes  TIMED UP AND GO:  Was the test performed? Yes .  Length of time to ambulate 10 feet: 10 sec.   Gait slow and steady with assistive device  Cognitive Function:        10/15/2021    3:27 PM 07/18/2020    1:34 PM  6CIT Screen  What Year? 0 points 0 points  What month? 0 points 0 points  What time? 0 points 0 points  Count back from 20 0 points 0 points  Months in reverse 0 points 0 points  Repeat phrase 0 points 0 points  Total Score 0 points 0 points    Immunizations Immunization History  Administered Date(s) Administered   Fluad Quad(high Dose 65+) 11/06/2018, 02/09/2020, 02/12/2021, 10/15/2021   Influenza, High Dose Seasonal PF 10/29/2017   Influenza-Unspecified 10/27/2017   PFIZER(Purple Top)SARS-COV-2 Vaccination 03/18/2019, 04/08/2019   Pneumococcal Conjugate-13 11/01/2015   Pneumococcal Polysaccharide-23 09/04/2017   Td 09/24/2017   Zoster Recombinat (Shingrix) 11/29/2016, 02/05/2017    TDAP status: Up to date  Flu Vaccine status: Completed at today's visit  Pneumococcal vaccine status: Up to date  Covid-19 vaccine status: Completed vaccines  Qualifies for Shingles Vaccine? Yes   Zostavax  completed Yes   Shingrix Completed?: Yes  Screening Tests Health Maintenance  Topic Date Due   COVID-19 Vaccine (3 - Pfizer series) 06/03/2019   OPHTHALMOLOGY EXAM  10/13/2019   Diabetic kidney evaluation - Urine ACR  08/05/2020   FOOT EXAM  08/09/2021   HEMOGLOBIN A1C  01/10/2022   Diabetic kidney evaluation - GFR measurement  05/26/2022   TETANUS/TDAP  09/25/2027   Pneumonia Vaccine 40+ Years old  Completed   INFLUENZA VACCINE  Completed   DEXA SCAN  Completed   Zoster Vaccines- Shingrix  Completed   HPV VACCINES  Aged Out    Health Maintenance  Health Maintenance Due  Topic Date Due   COVID-19 Vaccine (3 - Pfizer series) 06/03/2019   OPHTHALMOLOGY EXAM  10/13/2019   Diabetic kidney evaluation - Urine ACR  08/05/2020   FOOT EXAM  08/09/2021    Colorectal cancer screening: No longer required.   Mammogram status: No longer required due to age.  DEXA Scan: 08/25/2017   Lung Cancer Screening: (Low Dose CT Chest recommended if Age 73-80 years, 30 pack-year currently smoking OR have quit w/in 15years.) does not qualify.   Lung Cancer Screening Referral: not applicable   Additional Screening:  Hepatitis C Screening: not appicable   Vision Screening: Recommended annual ophthalmology exams for early detection of glaucoma and other disorders of the eye. Is the patient up to date with their annual eye exam?  Yes  Who is the provider or what is the name of the office in which the patient attends annual eye exams? [facility]  If pt is not established with a provider, would they like to be referred to a provider to establish care? No .   Dental Screening: Recommended annual dental exams for proper oral hygiene  Community Resource Referral / Chronic Care Management: CRR required this visit?  No   CCM required this visit?  No      Plan:     I have personally reviewed and noted the following in the patient's chart:   Medical and social history Use of alcohol,  tobacco or illicit drugs  Current medications and supplements including opioid prescriptions. Patient is not currently taking opioid prescriptions. Functional ability and status Nutritional status Physical activity Advanced directives List of other physicians Hospitalizations, surgeries,  and ER visits in previous 12 months Vitals Screenings to include cognitive, depression, and falls Referrals and appointments  In addition, I have reviewed and discussed with patient certain preventive protocols, quality metrics, and best practice recommendations. A written personalized care plan for preventive services as well as general preventive health recommendations were provided to patient.    Jenna [name] , Thank you for taking time to come for your Medicare Wellness Visit. I appreciate your ongoing commitment to your health goals. Please review the following plan we discussed and let me know if I can assist you in the future.   These are the goals we discussed:  Goals      Patient Stated     07/18/2020, exercise more to get more flexible and stable     Stay Active and Independent-Low Back Pain     Why is this important?   Regular activity or exercise is important to managing back pain.  Activity helps to keep your muscles strong.  You will sleep better and feel more relaxed.  You will have more energy and feel less stressed.  If you are not active now, start slowly. Little changes make a big difference.  Rest, but not too much.  Stay as active as you can and listen to your body's signals.          This is a list of the screening recommended for you and due dates:  Health Maintenance  Topic Date Due   COVID-19 Vaccine (3 - [place] series) 06/03/2019   Eye exam for diabetics  10/13/2019   Yearly kidney health urinalysis for diabetes  08/05/2020   Complete foot exam   08/09/2021   Hemoglobin A1C  01/10/2022   Yearly kidney function blood test for diabetes  05/26/2022   Tetanus Vaccine   09/25/2027   Pneumonia Vaccine  Completed   Flu Shot  Completed   DEXA scan (bone density measurement)  Completed   Zoster (Shingles) Vaccine  Completed   HPV Vaccine  Aged [address] [place]   10/15/2021   Nurse Notes: Approximately 40 minute Face-to-Face Visit

## 2021-10-21 DIAGNOSIS — J449 Chronic obstructive pulmonary disease, unspecified: Secondary | ICD-10-CM | POA: Diagnosis not present

## 2021-10-22 ENCOUNTER — Encounter: Payer: Self-pay | Admitting: Family Medicine

## 2021-11-01 ENCOUNTER — Emergency Department: Payer: Medicare Other

## 2021-11-01 ENCOUNTER — Other Ambulatory Visit: Payer: Self-pay

## 2021-11-01 DIAGNOSIS — Z20822 Contact with and (suspected) exposure to covid-19: Secondary | ICD-10-CM | POA: Insufficient documentation

## 2021-11-01 DIAGNOSIS — I491 Atrial premature depolarization: Secondary | ICD-10-CM | POA: Diagnosis not present

## 2021-11-01 DIAGNOSIS — J441 Chronic obstructive pulmonary disease with (acute) exacerbation: Secondary | ICD-10-CM | POA: Insufficient documentation

## 2021-11-01 DIAGNOSIS — R0602 Shortness of breath: Secondary | ICD-10-CM | POA: Diagnosis not present

## 2021-11-01 DIAGNOSIS — R8290 Unspecified abnormal findings in urine: Secondary | ICD-10-CM | POA: Insufficient documentation

## 2021-11-01 DIAGNOSIS — R109 Unspecified abdominal pain: Secondary | ICD-10-CM | POA: Diagnosis not present

## 2021-11-01 DIAGNOSIS — Z9884 Bariatric surgery status: Secondary | ICD-10-CM | POA: Diagnosis not present

## 2021-11-01 DIAGNOSIS — Z90710 Acquired absence of both cervix and uterus: Secondary | ICD-10-CM | POA: Insufficient documentation

## 2021-11-01 DIAGNOSIS — I700 Atherosclerosis of aorta: Secondary | ICD-10-CM | POA: Diagnosis not present

## 2021-11-01 DIAGNOSIS — R0600 Dyspnea, unspecified: Secondary | ICD-10-CM | POA: Diagnosis not present

## 2021-11-01 LAB — COMPREHENSIVE METABOLIC PANEL
ALT: 23 U/L (ref 0–44)
AST: 26 U/L (ref 15–41)
Albumin: 3.8 g/dL (ref 3.5–5.0)
Alkaline Phosphatase: 58 U/L (ref 38–126)
Anion gap: 8 (ref 5–15)
BUN: 14 mg/dL (ref 8–23)
CO2: 30 mmol/L (ref 22–32)
Calcium: 9 mg/dL (ref 8.9–10.3)
Chloride: 105 mmol/L (ref 98–111)
Creatinine, Ser: 1 mg/dL (ref 0.44–1.00)
GFR, Estimated: 56 mL/min — ABNORMAL LOW (ref >60)
Glucose, Bld: 141 mg/dL — ABNORMAL HIGH (ref 70–99)
Potassium: 3.6 mmol/L (ref 3.5–5.1)
Sodium: 143 mmol/L (ref 135–145)
Total Bilirubin: 0.6 mg/dL (ref 0.3–1.2)
Total Protein: 6.7 g/dL (ref 6.5–8.1)

## 2021-11-01 LAB — CBC
HCT: 37.7 % (ref 36.0–46.0)
Hemoglobin: 12.1 g/dL (ref 12.0–15.0)
MCH: 31.5 pg (ref 26.0–34.0)
MCHC: 32.1 g/dL (ref 30.0–36.0)
MCV: 98.2 fL (ref 80.0–100.0)
Platelets: 213 10*3/uL (ref 150–400)
RBC: 3.84 MIL/uL — ABNORMAL LOW (ref 3.87–5.11)
RDW: 13 % (ref 11.5–15.5)
WBC: 7.9 10*3/uL (ref 4.0–10.5)
nRBC: 0 % (ref 0.0–0.2)

## 2021-11-01 LAB — RESP PANEL BY RT-PCR (FLU A&B, COVID) ARPGX2
Influenza A by PCR: NEGATIVE
Influenza B by PCR: NEGATIVE
SARS Coronavirus 2 by RT PCR: NEGATIVE

## 2021-11-01 LAB — TROPONIN I (HIGH SENSITIVITY): Troponin I (High Sensitivity): 5 ng/L (ref <18)

## 2021-11-01 NOTE — ED Triage Notes (Signed)
Breathing difficulty since last Friday. Pulmonologist recommended  pt come to ED this evening for further eval. Hx of COPD, no 02 use at home.  C/o cough, difficulty with deep inspiration. Denies chest pain Resp even and unlabored in triage, able to talk in clear and complete sentences. NAD noted at this time.

## 2021-11-02 ENCOUNTER — Emergency Department: Payer: Medicare Other

## 2021-11-02 ENCOUNTER — Emergency Department
Admission: EM | Admit: 2021-11-02 | Discharge: 2021-11-02 | Disposition: A | Discharge status: Home / Self Care | Payer: Medicare Other | Attending: Emergency Medicine | Admitting: Emergency Medicine

## 2021-11-02 DIAGNOSIS — I700 Atherosclerosis of aorta: Secondary | ICD-10-CM | POA: Diagnosis not present

## 2021-11-02 DIAGNOSIS — J441 Chronic obstructive pulmonary disease with (acute) exacerbation: Secondary | ICD-10-CM

## 2021-11-02 DIAGNOSIS — R109 Unspecified abdominal pain: Secondary | ICD-10-CM | POA: Diagnosis not present

## 2021-11-02 DIAGNOSIS — R0602 Shortness of breath: Secondary | ICD-10-CM

## 2021-11-02 LAB — URINALYSIS, ROUTINE W REFLEX MICROSCOPIC
Bacteria, UA: NONE SEEN
Bilirubin Urine: NEGATIVE
Glucose, UA: NEGATIVE mg/dL
Hgb urine dipstick: NEGATIVE
Ketones, ur: 5 mg/dL — AB
Nitrite: NEGATIVE
Protein, ur: NEGATIVE mg/dL
Specific Gravity, Urine: 1.031 — ABNORMAL HIGH (ref 1.005–1.030)
pH: 6 (ref 5.0–8.0)

## 2021-11-02 LAB — D-DIMER, QUANTITATIVE: D-Dimer, Quant: 0.51 ug/mL-FEU — ABNORMAL HIGH (ref 0.00–0.50)

## 2021-11-02 LAB — TROPONIN I (HIGH SENSITIVITY): Troponin I (High Sensitivity): 6 ng/L (ref <18)

## 2021-11-02 MED ORDER — IPRATROPIUM-ALBUTEROL 0.5-2.5 (3) MG/3ML IN SOLN
6.0000 mL | Freq: Once | RESPIRATORY_TRACT | Status: AC
  Administered 2021-11-02: 6 mL via RESPIRATORY_TRACT
  Filled 2021-11-02: qty 6

## 2021-11-02 MED ORDER — METHYLPREDNISOLONE SODIUM SUCC 125 MG IJ SOLR
125.0000 mg | Freq: Once | INTRAMUSCULAR | Status: AC
  Administered 2021-11-02: 125 mg via INTRAVENOUS
  Filled 2021-11-02: qty 2

## 2021-11-02 MED ORDER — IPRATROPIUM-ALBUTEROL 0.5-2.5 (3) MG/3ML IN SOLN
RESPIRATORY_TRACT | Status: AC
  Administered 2021-11-02: 6 mL via RESPIRATORY_TRACT
  Filled 2021-11-02: qty 6

## 2021-11-02 MED ORDER — IOHEXOL 350 MG/ML SOLN
100.0000 mL | Freq: Once | INTRAVENOUS | Status: AC | PRN
  Administered 2021-11-02: 100 mL via INTRAVENOUS

## 2021-11-02 MED ORDER — PREDNISONE 10 MG (21) PO TBPK: ORAL_TABLET | ORAL | 0 refills | Status: DC

## 2021-11-02 NOTE — ED Provider Notes (Signed)
[facility] Provider Note    Event Date/Time   First MD Initiated Contact with Patient 11/02/21 0153     (approximate)   History   Shortness of Breath   HPI  Jenna [name] is a 86 y.o. female who presents to the ED for evaluation of Shortness of Breath   I reviewed pulmonary clinic visit from 6/28.  History of COPD, bariatric surgery, Trelegy but no chronic O2.  Patient presents to the ED for evaluation of increasing shortness of breath and nonproductive cough over the past few days.  She reports some pleuritic discomfort to the right side of her chest that she thinks is due to her coughing.   Physical Exam   Triage Vital Signs: ED Triage Vitals  Enc Vitals Group     BP 11/01/21 2018 (!) 147/60     Pulse Rate 11/01/21 2018 81     Resp 11/01/21 2018 18     Temp 11/01/21 2018 98.4 F (36.9 C)     Temp Source 11/01/21 2018 Oral     SpO2 11/01/21 2018 100 %     Weight --      Height 11/01/21 2022 '5\' 1"'$  (1.549 m)     Head Circumference --      Peak Flow --      Pain Score --      Pain Loc --      Pain Edu? --      Excl. in [place]? --     Most recent vital signs: Vitals:   11/02/21 0715 11/02/21 0730  BP:  (!) 123/47  Pulse: 94 95  Resp: (!) 21 (!) 24  Temp:    SpO2: 96% 95%    General: Awake, no distress.  CV:  Good peripheral perfusion.  Resp:  Wheezing throughout, decreased airflow, slightly tachypneic to the mid 20s. Abd:  No distention.  Mild and poorly localizing tenderness throughout without peritoneal features. MSK:  No deformity noted.  Neuro:  No focal deficits appreciated. Other:     ED Results / Procedures / Treatments   Labs (all labs ordered are listed, but only abnormal results are displayed) Labs Reviewed  CBC - Abnormal; Notable for the following components:      Result Value   RBC 3.84 (*)    All other components within normal limits  COMPREHENSIVE METABOLIC PANEL - Abnormal; Notable for the following  components:   Glucose, Bld 141 (*)    GFR, Estimated 56 (*)    All other components within normal limits  D-DIMER, QUANTITATIVE - Abnormal; Notable for the following components:   D-Dimer, Quant 0.51 (*)    All other components within normal limits  URINALYSIS, ROUTINE W REFLEX MICROSCOPIC - Abnormal; Notable for the following components:   Color, Urine STRAW (*)    APPearance HAZY (*)    Specific Gravity, Urine 1.031 (*)    Ketones, ur 5 (*)    Leukocytes,Ua MODERATE (*)    All other components within normal limits  RESP PANEL BY RT-PCR (FLU A&B, COVID) [id number]  TROPONIN I (HIGH SENSITIVITY)  TROPONIN I (HIGH SENSITIVITY)    EKG Sinus rhythm with a rate of 73 bpm.  Normal axis and intervals.  Nonspecific ST changes inferiorly and laterally without STEMI.  RADIOLOGY CXR interpreted by me without evidence of acute cardiopulmonary pathology.  Official radiology report(s): CT ABDOMEN PELVIS W CONTRAST  Result Date: 11/02/2021 CLINICAL DATA:  86 year old female with right side abdominal pain and shortness  of breath for about a week. EXAM: CT ABDOMEN AND PELVIS WITH CONTRAST TECHNIQUE: Multidetector CT imaging of the abdomen and pelvis was performed using the standard protocol following bolus administration of intravenous contrast. RADIATION DOSE REDUCTION: This exam was performed according to the departmental dose-optimization program which includes automated exposure control, adjustment of the mA and/or kV according to patient size and/or use of iterative reconstruction technique. CONTRAST:  179m OMNIPAQUE IOHEXOL 350 MG/ML SOLN COMPARISON:  Chest radiographs 11/01/2021. CT Abdomen and Pelvis 09/22/2015. FINDINGS: Lower chest: Lung bases appears stable since 2017. Mild cardiomegaly. No abnormal pericardial or pleural effusion. No lung base edema or inflammation identified. Hepatobiliary: Chronically absent gallbladder. Liver appears stable and within normal limits. Pancreas: Chronic fatty  atrophy of the pancreas. Spleen: Negative. Adrenals/Urinary Tract: Normal adrenal glands. Nonobstructed kidneys with symmetric renal enhancement. And on delayed images symmetric renal contrast excretion. No nephrolithiasis or pararenal inflammation. Ureters and bladder appear negative. Occasional pelvic phleboliths. Stomach/Bowel: Gas-filled redundant rectosigmoid colon, nondilated. Mild retained stool in the descending colon and at the splenic flexure. Redundant transverse colon with mild retained stool. Evidence of chronic right hemicolectomy and primary hree anastomosis with no adverse features on series 2, image 41. No upstream dilated small bowel. Chronic gastrojejunostomy superimposed. Fairly decompressed bypassed stomach and duodenum. Downstream small bowel anastomosis on series 2, image 34 with no adverse features. No free air, free fluid, or mesenteric inflammation identified. Vascular/Lymphatic: Mild for age Aortoiliac calcified atherosclerosis. Normal caliber abdominal aorta. Major vascular structures in the abdomen and pelvis appear to be patent. No lymphadenopathy identified. Reproductive: Absent uterus.  Diminutive or absent ovaries. Other: Mild increased generalized body wall edema at the flanks and pelvis when compared to 2017. No pelvic free fluid. Musculoskeletal: Flowing endplate osteophytes resulting in chronic interbody ankylosis in much of the visible thoracic spine. Stable visualized osseous structures. IMPRESSION: 1. No acute or inflammatory process identified. 2. 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 3. Chronic hysterectomy and cholecystectomy. 4.  Aortic Atherosclerosis (ICD10-I70.0). Electronically Signed   By: HGenevie AnnM.D.   On: 11/02/2021 04:54   DG Chest 2 View  Result Date: 11/01/2021 CLINICAL DATA:  Dyspnea EXAM: CHEST - 2 VIEW COMPARISON:  05/17/2021 FINDINGS: Lungs are well expanded, symmetric, and clear.  No pneumothorax or pleural effusion. Cardiac size within normal limits. Pulmonary vascularity is normal. Osseous structures are age-appropriate. No acute bone abnormality. IMPRESSION: No active cardiopulmonary disease. Electronically Signed   By: AFidela SalisburyM.D.   On: 11/01/2021 20:51    PROCEDURES and INTERVENTIONS:  .1-3 Lead EKG Interpretation  Performed by: SVladimir Crofts MD Authorized by: SVladimir Crofts MD     Interpretation: normal     ECG rate:  90   ECG rate assessment: normal     Rhythm: sinus rhythm     Ectopy: none     Conduction: normal     Medications  ipratropium-albuterol (DUONEB) 0.5-2.5 (3) MG/3ML nebulizer solution 6 mL (6 mLs Nebulization Given 11/02/21 0656)  methylPREDNISolone sodium succinate (SOLU-MEDROL) 125 mg/2 mL injection 125 mg (125 mg Intravenous Given 11/02/21 0240)  iohexol (OMNIPAQUE) 350 MG/ML injection 100 mL (100 mLs Intravenous Contrast Given 11/02/21 0419)     IMPRESSION / MDM / ASSESSMENT AND PLAN / ED COURSE  I reviewed the triage vital signs and the nursing notes.  Differential diagnosis includes, but is not limited to, COPD exacerbation, pneumonia, ACS, PE, pneumothorax  {Patient presents with symptoms of an acute illness or  injury that is potentially life-threatening.  86 year old woman presents to the ED with evidence of a COPD exacerbation suitable for outpatient management.  She presents with significant wheezing and tachypnea, rightly improved with multiple rounds of breathing treatments and starting steroids.  Work-up is fairly benign without evidence of infiltrate or PTX.  Normal white count and metabolic panel.  Negative for flu/COVID.  2 negative troponins.  D-dimer is negative, when adjusted for her age.  Urine without clear infectious features considering her lack of symptoms.  We will send this for culture.  CT abdomen/pelvis obtained due to her abdominal tenderness and her surgical history and this is reassuring.  I considered  observation admission for this patient but we will discharge with close return precautions and steroid taper.  Clinical Course as of 11/02/21 0755  Fri Nov 02, 2021  0748 Reassessed after second round of breathing treatments.  Much better.  Discussed plan of care and she is agreeable to outpatient management.  We discussed steroid taper over the next few days as well as continuing all of her other inhalers.  Discussed return precautions.  Answered questions. [DS]    Clinical Course User Index [DS] Vladimir Crofts, MD     FINAL CLINICAL IMPRESSION(S) / ED DIAGNOSES   Final diagnoses:  COPD exacerbation ([place])  Shortness of breath     Rx / DC Orders   ED Discharge Orders          Ordered    predniSONE (STERAPRED UNI-PAK 21 TAB) 10 MG (21) TBPK tablet  Daily        11/02/21 0749             Note:  This document was prepared using Dragon voice recognition software and may include unintentional dictation errors.   Vladimir Crofts, MD 11/02/21 [phone]

## 2021-11-02 NOTE — Discharge Instructions (Signed)
Start the prednisone steroids tomorrow (Saturday) as the steroids we gave you here in the ED will count for today (Friday).  Continue all of your other inhalers and medications.  Return to the ED with any worsening symptoms despite this.

## 2021-11-03 LAB — URINE CULTURE: Culture: NO GROWTH

## 2021-11-07 ENCOUNTER — Telehealth: Payer: Self-pay | Admitting: *Deleted

## 2021-11-07 NOTE — Telephone Encounter (Signed)
     Patient  visit on 11/02/2021  at [facility]  was for SOB  Have you been able to follow up with your primary care physician? Not feeling better is seeing Dr Wendi Snipes , patient was in waiting room 7 hours she was unhappy they were not provided anything to eat .  The patient was able to obtain any needed medicine or equipment.  Are there diet recommendations that you are having difficulty following?  Patient expresses understanding of discharge instructions and education provided has no other needs at this time.    [facility] [phone] [address] , [place] [address] Email : [email or web address]

## 2021-11-09 ENCOUNTER — Ambulatory Visit (INDEPENDENT_AMBULATORY_CARE_PROVIDER_SITE_OTHER): Payer: Medicare Other | Admitting: Family Medicine

## 2021-11-09 ENCOUNTER — Encounter: Payer: Self-pay | Admitting: Family Medicine

## 2021-11-09 VITALS — BP 163/57 | HR 76 | Ht 61.0 in | Wt 212.0 lb

## 2021-11-09 DIAGNOSIS — J432 Centrilobular emphysema: Secondary | ICD-10-CM | POA: Diagnosis not present

## 2021-11-09 MED ORDER — IPRATROPIUM-ALBUTEROL 0.5-2.5 (3) MG/3ML IN SOLN: 3.0000 mL | RESPIRATORY_TRACT | 5 refills | Status: AC | PRN

## 2021-11-09 MED ORDER — PREDNISONE 20 MG PO TABS: ORAL_TABLET | ORAL | 0 refills | Status: DC

## 2021-11-09 NOTE — Patient Instructions (Addendum)
Thank you for coming to the office today.  Ordered Duoneb, higher quantity to [facility] Added a back up plan Prednisone taper 7 day only if needed.  Please schedule a Follow-up Appointment to: Return in about 4 months (around 03/12/2022) for 4 month follow-up COPD / Anxiety/Mood.  If you have any other questions or concerns, please feel free to call the office or send a message through [place]. You may also schedule an earlier appointment if necessary.  Additionally, you may be receiving a survey about your experience at our office within a few days to 1 week by e-mail or mail. We value your feedback.  Nobie Putnam, DO [place]

## 2021-11-09 NOTE — Progress Notes (Signed)
Subjective:    Patient ID: Jenna [name], female    DOB: [birth year], 86 y.o.   MRN: [id number]  Jenna [name] is a 87 y.o. female presenting on 11/09/2021 for Hospitalization Follow-up and COPD   HPI  ED FOLLOW-UP VISIT  Hospital/Location: [facility] Date of ED Visit: 11/02/21  Reason for Presenting to ED: COPD Exac  FOLLOW-UP  - ED provider note and record have been reviewed - Patient presents today about 7 days after recent ED visit. Brief summary of recent course, patient had symptoms of shortness of breath and coughing flare up onset 2 weeks, she was at an outdoor funeral and got sick, presented to ED on 11/02/21, testing in ED with labs CXR COVID test CT, treated with steroid injection and discharge w/ prednisone for COPD. - Today reports overall has done well after discharge from ED. Symptoms of dyspnea and cough have improved Tried Buspar some initially but needs to resume, has plenty at home.  I have reviewed the discharge medication list, and have reconciled the current and discharge medications today.     10/15/2021    3:17 PM 07/11/2021    5:14 PM 04/06/2021    5:49 PM  Depression screen PHQ 2/9  Decreased Interest 0 2 2  Down, Depressed, Hopeless '3 1 1  '$ PHQ - 2 Score '3 3 3  '$ Altered sleeping '1 2 2  '$ Tired, decreased energy 0 1 1  Change in appetite 0  0  Feeling bad or failure about yourself  0 0 0  Trouble concentrating 0 0 0  Moving slowly or fidgety/restless 0 0 0  Suicidal thoughts 0 0 0  PHQ-9 Score '4 6 6  '$ Difficult doing work/chores Not difficult at all Somewhat difficult Somewhat difficult    Social History   Tobacco Use   Smoking status: Former    Packs/day: 1.00    Years: 20.00    Total pack years: 20.00    Types: Cigarettes    Quit date: 07/1981    Years since quitting: 40.3   Smokeless tobacco: Former  Scientific laboratory technician Use: Never used  Substance Use Topics   Alcohol use: Not Currently    Comment: past   Drug use: No    Review of  Systems Per HPI unless specifically indicated above     Objective:    BP (!) 163/57   Pulse 76   Ht '5\' 1"'$  (1.549 m)   Wt 212 lb (96.2 kg)   SpO2 98%   BMI 40.06 kg/m   Wt Readings from Last 3 Encounters:  11/09/21 212 lb (96.2 kg)  10/15/21 206 lb 9.6 oz (93.7 kg)  07/11/21 203 lb (92.1 kg)    Physical Exam Vitals and nursing note reviewed.  Constitutional:      General: She is not in acute distress.    Appearance: She is well-developed. She is obese. She is not diaphoretic.     Comments: Well-appearing, comfortable, cooperative  HENT:     Head: Normocephalic and atraumatic.  Eyes:     General:        Right eye: No discharge.        Left eye: No discharge.     Conjunctiva/sclera: Conjunctivae normal.  Neck:     Thyroid: No thyromegaly.  Cardiovascular:     Rate and Rhythm: Normal rate and regular rhythm.     Heart sounds: Normal heart sounds. No murmur heard. Pulmonary:     Effort: Pulmonary effort is normal.  No respiratory distress.     Breath sounds: Normal breath sounds. No wheezing or rales.     Comments: Some reduced air movement. Musculoskeletal:        General: Normal range of motion.     Cervical back: Normal range of motion and neck supple.  Lymphadenopathy:     Cervical: No cervical adenopathy.  Skin:    General: Skin is warm and dry.     Findings: No erythema or rash.  Neurological:     Mental Status: She is alert and oriented to person, place, and time.  Psychiatric:        Behavior: Behavior normal.     Comments: Well groomed, good eye contact, normal speech and thoughts     I have personally reviewed the radiology report from 11/02/21.  DG Chest 2 ViewPerformed 11/01/2021 Final result  Study Result CLINICAL DATA: Dyspnea  EXAM: CHEST - 2 VIEW  COMPARISON: 05/17/2021  FINDINGS: Lungs are well expanded, symmetric, and clear. No pneumothorax or pleural effusion. Cardiac size within normal limits. Pulmonary vascularity is normal.  Osseous structures are age-appropriate. No acute bone abnormality.  IMPRESSION: No active cardiopulmonary disease.   Electronically Signed By: Fidela Salisbury M.D. On: 11/01/2021 20:51    CT ABDOMEN PELVIS W CONTRASTPerformed 11/02/2021 Final result  Study Result CLINICAL DATA: 86 year old female with right side abdominal pain and shortness of breath for about a week.  EXAM: CT ABDOMEN AND PELVIS WITH CONTRAST  TECHNIQUE: Multidetector CT imaging of the abdomen and pelvis was performed using the standard protocol following bolus administration of intravenous contrast.  RADIATION DOSE REDUCTION: This exam was performed according to the departmental dose-optimization program which includes automated exposure control, adjustment of the mA and/or kV according to patient size and/or use of iterative reconstruction technique.  CONTRAST: 18m OMNIPAQUE IOHEXOL 350 MG/ML SOLN  COMPARISON: Chest radiographs 11/01/2021. CT Abdomen and Pelvis 09/22/2015.  FINDINGS: Lower chest: Lung bases appears stable since 2017. Mild cardiomegaly. No abnormal pericardial or pleural effusion. No lung base edema or inflammation identified.  Hepatobiliary: Chronically absent gallbladder. Liver appears stable and within normal limits.  Pancreas: Chronic fatty atrophy of the pancreas.  Spleen: Negative.  Adrenals/Urinary Tract: Normal adrenal glands. Nonobstructed kidneys with symmetric renal enhancement. And on delayed images symmetric renal contrast excretion. No nephrolithiasis or pararenal inflammation. Ureters and bladder appear negative. Occasional pelvic phleboliths.  Stomach/Bowel: Gas-filled redundant rectosigmoid colon, nondilated. Mild retained stool in the descending colon and at the splenic flexure. Redundant transverse colon with mild retained stool. Evidence of chronic right hemicolectomy and primary hree anastomosis with no adverse features on series 2, image 41. No  upstream dilated small bowel. Chronic gastrojejunostomy superimposed. Fairly decompressed bypassed stomach and duodenum. Downstream small bowel anastomosis on series 2, image 34 with no adverse features. No free air, free fluid, or mesenteric inflammation identified.  Vascular/Lymphatic: Mild for age Aortoiliac calcified atherosclerosis. Normal caliber abdominal aorta. Major vascular structures in the abdomen and pelvis appear to be patent. No lymphadenopathy identified.  Reproductive: Absent uterus. Diminutive or absent ovaries.  Other: Mild increased generalized body wall edema at the flanks and pelvis when compared to 2017. No pelvic free fluid.  Musculoskeletal: Flowing endplate osteophytes resulting in chronic interbody ankylosis in much of the visible thoracic spine. Stable visualized osseous structures.  IMPRESSION: 1. No acute or inflammatory process identified. 2. 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 3. Chronic hysterectomy and cholecystectomy. 4. Aortic Atherosclerosis (ICD10-I70.0).   Electronically  Signed By: Genevie Ann M.D. On: 11/02/2021 04:54    Results for orders placed or performed during the hospital encounter of 11/02/21  Resp Panel by RT-PCR (Flu A&B, Covid) Anterior Nasal Swab   Specimen: Anterior Nasal Swab  Result Value Ref Range   SARS Coronavirus 2 by RT PCR NEGATIVE NEGATIVE   Influenza A by PCR NEGATIVE NEGATIVE   Influenza B by PCR NEGATIVE NEGATIVE  Urine Culture   Specimen: Urine, Clean Catch  Result Value Ref Range   Specimen Description      URINE, CLEAN CATCH Performed at [facility], [address] [place], [place] [address]    Special Requests      NONE Performed at [facility], [address], [place], [place] [address]    Culture      NO GROWTH Performed at [facility], [place] [address], [place], [place] [address]    Report  Status 11/03/2021 FINAL   CBC  Result Value Ref Range   WBC 7.9 4.0 - 10.5 K/uL   RBC 3.84 (L) 3.87 - 5.11 MIL/uL   Hemoglobin 12.1 12.0 - 15.0 g/dL   HCT 37.7 36.0 - 46.0 %   MCV 98.2 80.0 - 100.0 fL   MCH 31.5 26.0 - 34.0 pg   MCHC 32.1 30.0 - 36.0 g/dL   RDW 13.0 11.5 - 15.5 %   Platelets 213 150 - 400 K/uL   nRBC 0.0 0.0 - 0.2 %  Comprehensive metabolic panel  Result Value Ref Range   Sodium 143 135 - 145 mmol/L   Potassium 3.6 3.5 - 5.1 mmol/L   Chloride 105 98 - 111 mmol/L   CO2 30 22 - 32 mmol/L   Glucose, Bld 141 (H) 70 - 99 mg/dL   BUN 14 8 - 23 mg/dL   Creatinine, Ser 1.00 0.44 - 1.00 mg/dL   Calcium 9.0 8.9 - 10.3 mg/dL   Total Protein 6.7 6.5 - 8.1 g/dL   Albumin 3.8 3.5 - 5.0 g/dL   AST 26 15 - 41 U/L   ALT 23 0 - 44 U/L   Alkaline Phosphatase 58 38 - 126 U/L   Total Bilirubin 0.6 0.3 - 1.2 mg/dL   GFR, Estimated 56 (L) >60 mL/min   Anion gap 8 5 - 15  D-dimer, quantitative  Result Value Ref Range   D-Dimer, Quant 0.51 (H) 0.00 - 0.50 ug/mL-FEU  Urinalysis, Routine w reflex microscopic Urine, Clean Catch  Result Value Ref Range   Color, Urine STRAW (A) YELLOW   APPearance HAZY (A) CLEAR   Specific Gravity, Urine 1.031 (H) 1.005 - 1.030   pH 6.0 5.0 - 8.0   Glucose, UA NEGATIVE NEGATIVE mg/dL   Hgb urine dipstick NEGATIVE NEGATIVE   Bilirubin Urine NEGATIVE NEGATIVE   Ketones, ur 5 (A) NEGATIVE mg/dL   Protein, ur NEGATIVE NEGATIVE mg/dL   Nitrite NEGATIVE NEGATIVE   Leukocytes,Ua MODERATE (A) NEGATIVE   RBC / HPF 0-5 0 - 5 RBC/hpf   WBC, UA 21-50 0 - 5 WBC/hpf   Bacteria, UA NONE SEEN NONE SEEN   Squamous Epithelial / LPF 0-5 0 - 5  Troponin I (High Sensitivity)  Result Value Ref Range   Troponin I (High Sensitivity) 5 <18 ng/L  Troponin I (High Sensitivity)  Result Value Ref Range   Troponin I (High Sensitivity) 6 <18 ng/L      Assessment & Plan:   Problem List Items Addressed This Visit   None Visit Diagnoses  Centrilobular emphysema  ([place])    -  Primary   Relevant Medications   ipratropium-albuterol (DUONEB) 0.5-2.5 (3) MG/3ML SOLN   predniSONE (DELTASONE) 20 MG tablet       ED F/u AECOPD - resolving Improved on Duoneb, needs re order. Higher quantity using PRN - ordered today Finish current prednisone - also for FUTURE use PRN Added a back up plan Prednisone taper 7 day only if needed.  Meds ordered this encounter  Medications   ipratropium-albuterol (DUONEB) 0.5-2.5 (3) MG/3ML SOLN    Sig: Take 3 mLs by nebulization every 4 (four) hours as needed.    Dispense:  720 mL    Refill:  5   predniSONE (DELTASONE) 20 MG tablet    Sig: Take daily with food. Start with '60mg'$  (3 pills) x 2 days, then reduce to '40mg'$  (2 pills) x 2 days, then '20mg'$  (1 pill) x 3 days    Dispense:  13 tablet    Refill:  0      Follow up plan: Return in about 4 months (around 03/12/2022) for 4 month follow-up COPD / Anxiety/Mood.   Nobie Putnam, [facility] 11/09/2021, 2:16 PM

## 2021-11-14 IMAGING — CT CT NECK W/O CM
3 of 4 series · 13 of 35 positions shown, 16 images · non-contrast
Comparison: Maxillofacial CT 12/31/2013

CLINICAL DATA: Hoarseness.  COPD exacerbation.

EXAM:
CT NECK WITHOUT CONTRAST
TECHNIQUE: Multidetector CT imaging of the neck was performed following the
standard protocol without intravenous contrast.

[Series 2: axial neck · axial · 0.54mm/px · z∈[-272,-132]mm · 5 of 106 slices shown, 7 images]
[im 18/106  soft-tissue]
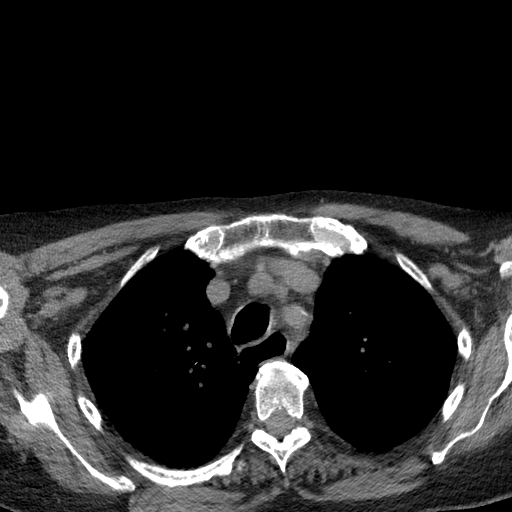
[im 18/106  bone]
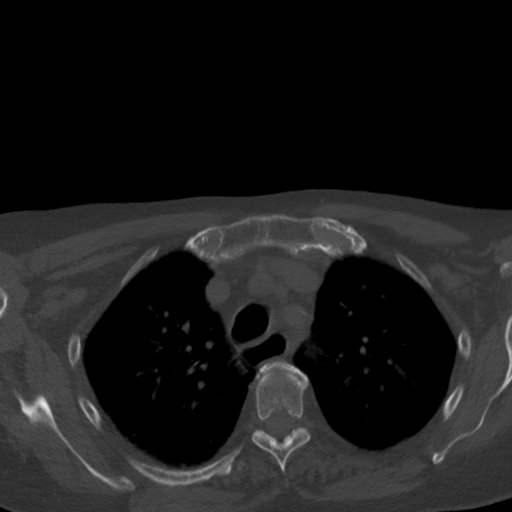
[im 36/106  bone]
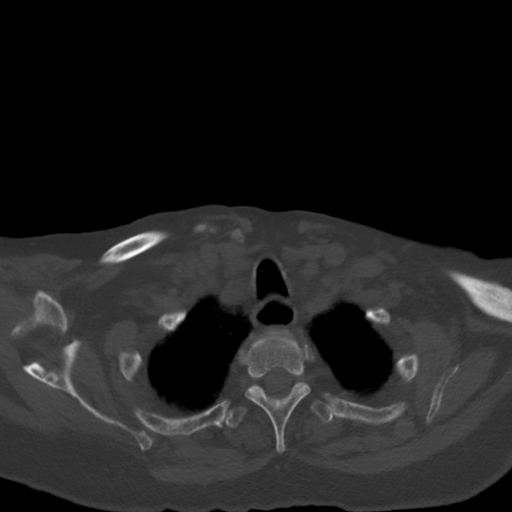
[im 53/106  bone]
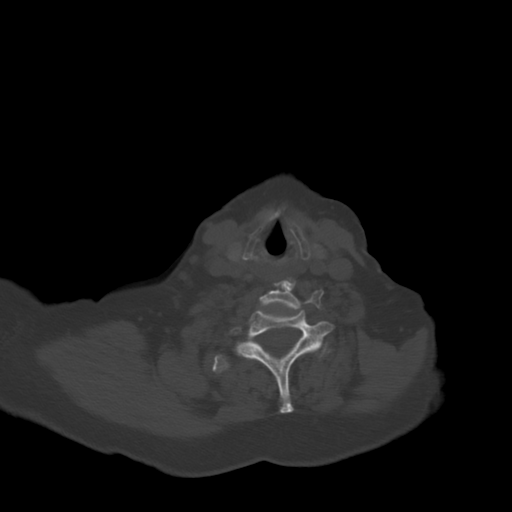
[im 71/106  bone]
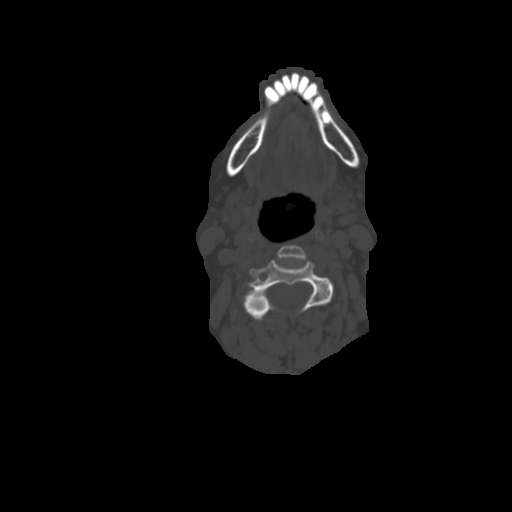
[im 88/106  soft-tissue]
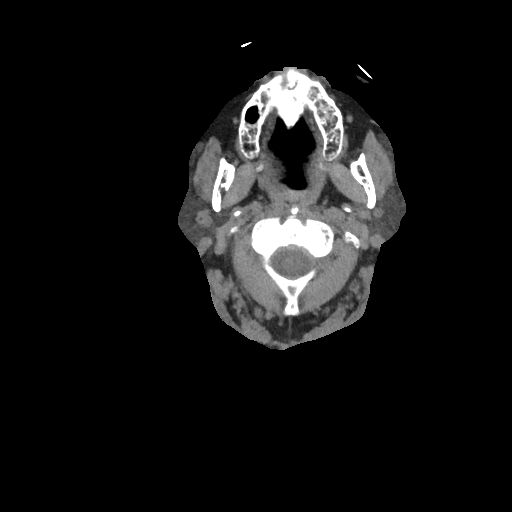
[im 88/106  bone]
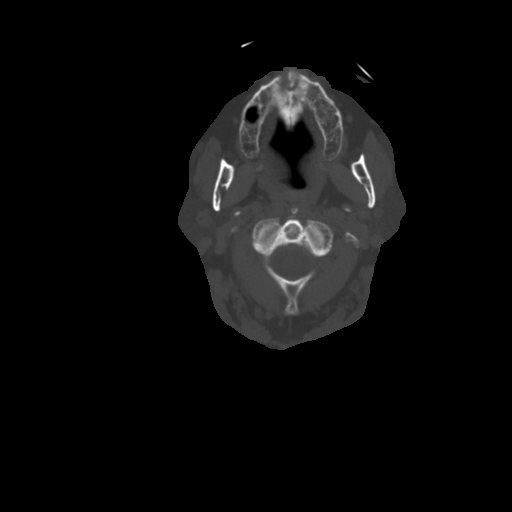

[Series 5: sag neck · sagittal · 0.41mm/px · 5 of 80 slices shown, 6 images]
[im 27/80  bone]
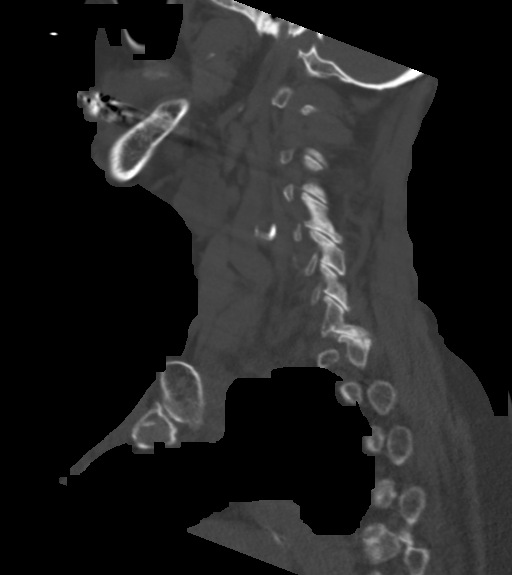
[im 33/80  bone]
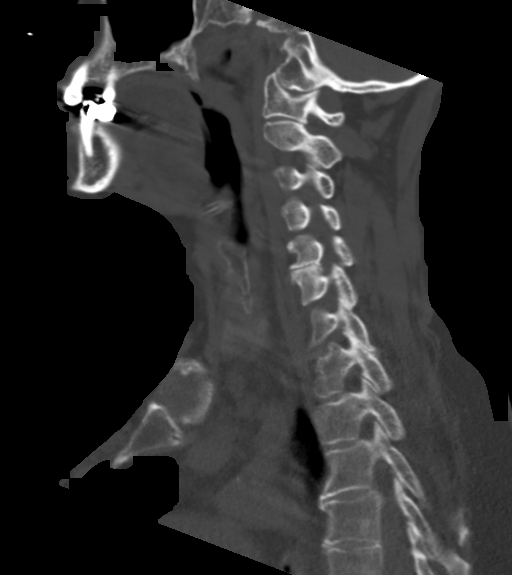
[im 40/80  soft-tissue]
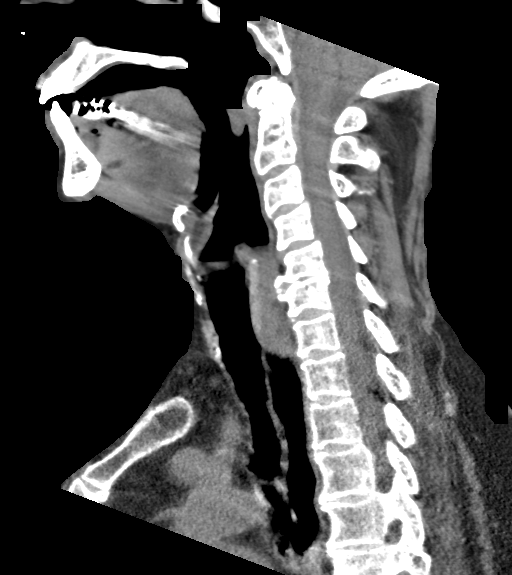
[im 40/80  bone]
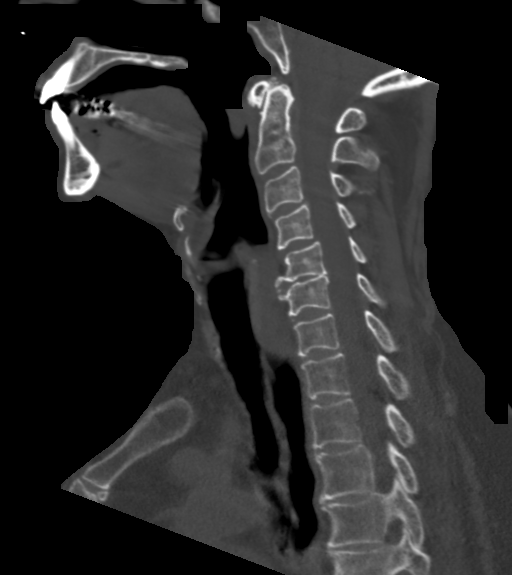
[im 47/80  bone]
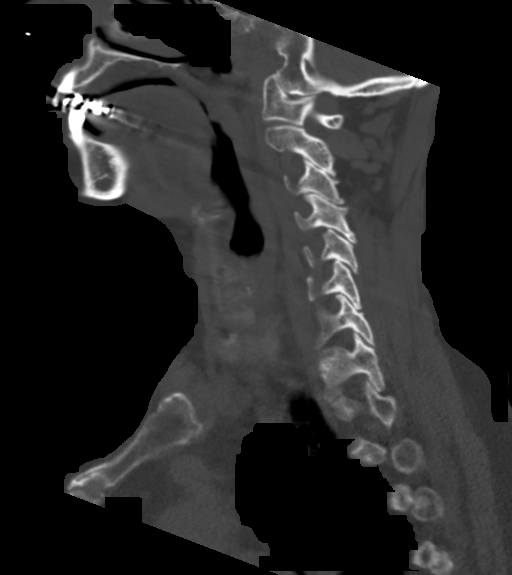
[im 53/80  bone]
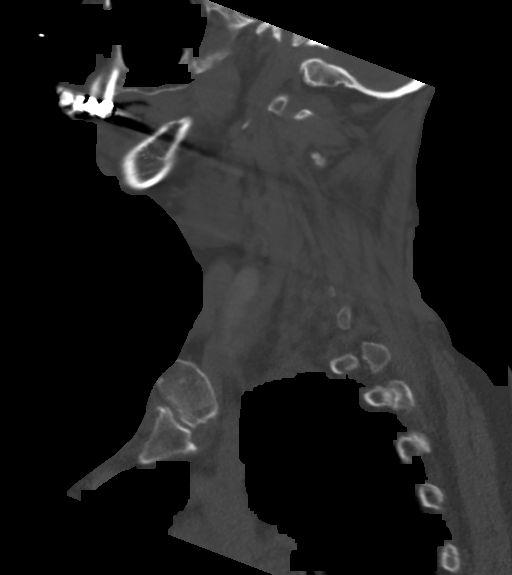

[Series 6: cor neck · coronal · 0.31mm/px · 3 of 106 slices shown]
[im 32/106  bone]
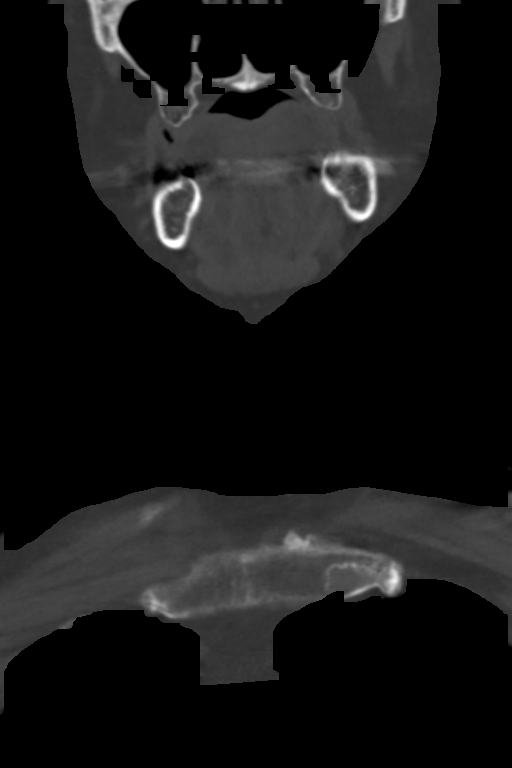
[im 46/106  bone]
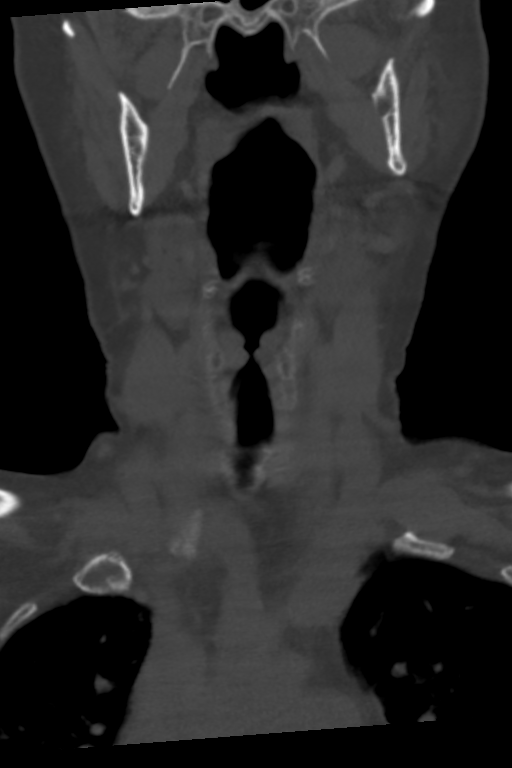
[im 60/106  bone]
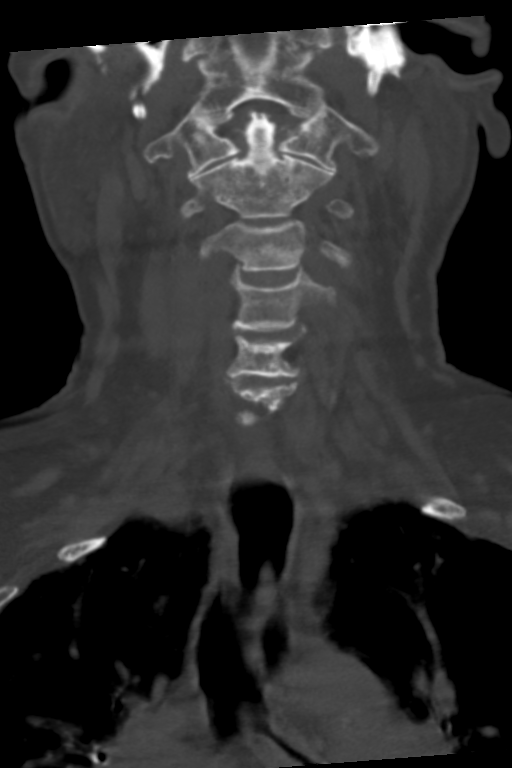

[13 of 35 positions shown; findings below may reference images not displayed]

FINDINGS: Pharynx and larynx: No evidence of mass or swelling within
limitations of mild motion artifact and noncontrast technique.
Widely patent airway. No fluid collection or inflammatory changes in
the parapharyngeal or retropharyngeal spaces.

Salivary glands: No inflammation, mass, or stone.

Thyroid: Unremarkable.

Lymph nodes: No enlarged or suspicious lymph nodes in the neck.

Vascular: Mild asymmetric atherosclerotic calcification at the left
carotid bifurcation.

Limited intracranial: Unremarkable.

Visualized orbits: Bilateral cataract extraction.

Mastoids and visualized paranasal sinuses: Clear.

Skeleton: Mild cervical disc and facet degeneration.

Upper chest: No apical lung consolidation or mass.

Other: None.
IMPRESSION: No acute abnormality identified in the neck.

## 2021-11-21 ENCOUNTER — Other Ambulatory Visit: Payer: Self-pay

## 2021-11-21 DIAGNOSIS — R600 Localized edema: Secondary | ICD-10-CM

## 2021-11-21 DIAGNOSIS — J449 Chronic obstructive pulmonary disease, unspecified: Secondary | ICD-10-CM | POA: Diagnosis not present

## 2021-11-21 MED ORDER — FUROSEMIDE 20 MG PO TABS: 20.0000 mg | ORAL_TABLET | Freq: Every day | ORAL | 3 refills | Status: DC | PRN

## 2021-12-02 ENCOUNTER — Emergency Department: Payer: Medicare Other

## 2021-12-02 ENCOUNTER — Inpatient Hospital Stay
Admission: EM | Admit: 2021-12-02 | Discharge: 2021-12-07 | DRG: 191 | Disposition: A | Discharge status: Home Health | Payer: Medicare Other | Attending: Internal Medicine | Admitting: Internal Medicine

## 2021-12-02 DIAGNOSIS — Z87891 Personal history of nicotine dependence: Secondary | ICD-10-CM | POA: Diagnosis not present

## 2021-12-02 DIAGNOSIS — I4719 Other supraventricular tachycardia: Secondary | ICD-10-CM | POA: Diagnosis not present

## 2021-12-02 DIAGNOSIS — M810 Age-related osteoporosis without current pathological fracture: Secondary | ICD-10-CM | POA: Diagnosis present

## 2021-12-02 DIAGNOSIS — K219 Gastro-esophageal reflux disease without esophagitis: Secondary | ICD-10-CM | POA: Diagnosis present

## 2021-12-02 DIAGNOSIS — R6889 Other general symptoms and signs: Secondary | ICD-10-CM | POA: Diagnosis not present

## 2021-12-02 DIAGNOSIS — Z833 Family history of diabetes mellitus: Secondary | ICD-10-CM | POA: Diagnosis not present

## 2021-12-02 DIAGNOSIS — R600 Localized edema: Secondary | ICD-10-CM | POA: Diagnosis present

## 2021-12-02 DIAGNOSIS — H9193 Unspecified hearing loss, bilateral: Secondary | ICD-10-CM | POA: Diagnosis present

## 2021-12-02 DIAGNOSIS — Z7952 Long term (current) use of systemic steroids: Secondary | ICD-10-CM

## 2021-12-02 DIAGNOSIS — I471 Supraventricular tachycardia, unspecified: Secondary | ICD-10-CM | POA: Diagnosis present

## 2021-12-02 DIAGNOSIS — Z9842 Cataract extraction status, left eye: Secondary | ICD-10-CM | POA: Diagnosis not present

## 2021-12-02 DIAGNOSIS — Z83438 Family history of other disorder of lipoprotein metabolism and other lipidemia: Secondary | ICD-10-CM | POA: Diagnosis not present

## 2021-12-02 DIAGNOSIS — Z6839 Body mass index (BMI) 39.0-39.9, adult: Secondary | ICD-10-CM

## 2021-12-02 DIAGNOSIS — Z961 Presence of intraocular lens: Secondary | ICD-10-CM | POA: Diagnosis present

## 2021-12-02 DIAGNOSIS — R0602 Shortness of breath: Secondary | ICD-10-CM | POA: Diagnosis not present

## 2021-12-02 DIAGNOSIS — Z1152 Encounter for screening for COVID-19: Secondary | ICD-10-CM

## 2021-12-02 DIAGNOSIS — R531 Weakness: Secondary | ICD-10-CM | POA: Diagnosis present

## 2021-12-02 DIAGNOSIS — E1122 Type 2 diabetes mellitus with diabetic chronic kidney disease: Secondary | ICD-10-CM | POA: Diagnosis present

## 2021-12-02 DIAGNOSIS — I129 Hypertensive chronic kidney disease with stage 1 through stage 4 chronic kidney disease, or unspecified chronic kidney disease: Secondary | ICD-10-CM | POA: Diagnosis present

## 2021-12-02 DIAGNOSIS — Z8249 Family history of ischemic heart disease and other diseases of the circulatory system: Secondary | ICD-10-CM | POA: Diagnosis not present

## 2021-12-02 DIAGNOSIS — I110 Hypertensive heart disease with heart failure: Secondary | ICD-10-CM | POA: Diagnosis not present

## 2021-12-02 DIAGNOSIS — E669 Obesity, unspecified: Secondary | ICD-10-CM | POA: Diagnosis not present

## 2021-12-02 DIAGNOSIS — R0689 Other abnormalities of breathing: Secondary | ICD-10-CM | POA: Diagnosis not present

## 2021-12-02 DIAGNOSIS — Z885 Allergy status to narcotic agent status: Secondary | ICD-10-CM

## 2021-12-02 DIAGNOSIS — E1165 Type 2 diabetes mellitus with hyperglycemia: Secondary | ICD-10-CM | POA: Diagnosis not present

## 2021-12-02 DIAGNOSIS — Z96653 Presence of artificial knee joint, bilateral: Secondary | ICD-10-CM | POA: Diagnosis present

## 2021-12-02 DIAGNOSIS — R069 Unspecified abnormalities of breathing: Secondary | ICD-10-CM | POA: Diagnosis not present

## 2021-12-02 DIAGNOSIS — Z79899 Other long term (current) drug therapy: Secondary | ICD-10-CM

## 2021-12-02 DIAGNOSIS — F419 Anxiety disorder, unspecified: Secondary | ICD-10-CM | POA: Diagnosis present

## 2021-12-02 DIAGNOSIS — Z9884 Bariatric surgery status: Secondary | ICD-10-CM

## 2021-12-02 DIAGNOSIS — Z886 Allergy status to analgesic agent status: Secondary | ICD-10-CM

## 2021-12-02 DIAGNOSIS — N1831 Chronic kidney disease, stage 3a: Secondary | ICD-10-CM | POA: Diagnosis present

## 2021-12-02 DIAGNOSIS — J441 Chronic obstructive pulmonary disease with (acute) exacerbation: Secondary | ICD-10-CM | POA: Diagnosis not present

## 2021-12-02 DIAGNOSIS — Z743 Need for continuous supervision: Secondary | ICD-10-CM | POA: Diagnosis not present

## 2021-12-02 DIAGNOSIS — R059 Cough, unspecified: Secondary | ICD-10-CM | POA: Diagnosis not present

## 2021-12-02 DIAGNOSIS — R61 Generalized hyperhidrosis: Secondary | ICD-10-CM | POA: Diagnosis not present

## 2021-12-02 DIAGNOSIS — Z9841 Cataract extraction status, right eye: Secondary | ICD-10-CM

## 2021-12-02 DIAGNOSIS — I5023 Acute on chronic systolic (congestive) heart failure: Secondary | ICD-10-CM

## 2021-12-02 DIAGNOSIS — I509 Heart failure, unspecified: Secondary | ICD-10-CM | POA: Diagnosis not present

## 2021-12-02 DIAGNOSIS — Z888 Allergy status to other drugs, medicaments and biological substances status: Secondary | ICD-10-CM

## 2021-12-02 DIAGNOSIS — E785 Hyperlipidemia, unspecified: Secondary | ICD-10-CM | POA: Diagnosis not present

## 2021-12-02 LAB — BASIC METABOLIC PANEL
Anion gap: 12 (ref 5–15)
BUN: 15 mg/dL (ref 8–23)
CO2: 27 mmol/L (ref 22–32)
Calcium: 8.9 mg/dL (ref 8.9–10.3)
Chloride: 103 mmol/L (ref 98–111)
Creatinine, Ser: 1.18 mg/dL — ABNORMAL HIGH (ref 0.44–1.00)
GFR, Estimated: 46 mL/min — ABNORMAL LOW (ref >60)
Glucose, Bld: 316 mg/dL — ABNORMAL HIGH (ref 70–99)
Potassium: 3.8 mmol/L (ref 3.5–5.1)
Sodium: 142 mmol/L (ref 135–145)

## 2021-12-02 LAB — TROPONIN I (HIGH SENSITIVITY)
Troponin I (High Sensitivity): 5 ng/L (ref <18)
Troponin I (High Sensitivity): 6 ng/L (ref <18)

## 2021-12-02 LAB — CBC
HCT: 38.1 % (ref 36.0–46.0)
Hemoglobin: 12.6 g/dL (ref 12.0–15.0)
MCH: 32.1 pg (ref 26.0–34.0)
MCHC: 33.1 g/dL (ref 30.0–36.0)
MCV: 97.2 fL (ref 80.0–100.0)
Platelets: 252 10*3/uL (ref 150–400)
RBC: 3.92 MIL/uL (ref 3.87–5.11)
RDW: 13.2 % (ref 11.5–15.5)
WBC: 7 10*3/uL (ref 4.0–10.5)
nRBC: 0 % (ref 0.0–0.2)

## 2021-12-02 LAB — RESP PANEL BY RT-PCR (FLU A&B, COVID) ARPGX2
Influenza A by PCR: NEGATIVE
Influenza B by PCR: NEGATIVE
SARS Coronavirus 2 by RT PCR: NEGATIVE

## 2021-12-02 LAB — BRAIN NATRIURETIC PEPTIDE: B Natriuretic Peptide: 75.6 pg/mL (ref 0.0–100.0)

## 2021-12-02 MED ORDER — GUAIFENESIN ER 600 MG PO TB12: 600.0000 mg | ORAL_TABLET | Freq: Two times a day (BID) | ORAL | 0 refills | Status: AC

## 2021-12-02 MED ORDER — DOXYCYCLINE HYCLATE 100 MG PO CAPS: 100.0000 mg | ORAL_CAPSULE | Freq: Two times a day (BID) | ORAL | 0 refills | Status: AC

## 2021-12-02 MED ORDER — MAGNESIUM SULFATE 2 GM/50ML IV SOLN
2.0000 g | INTRAVENOUS | Status: AC
  Administered 2021-12-02: 2 g via INTRAVENOUS
  Filled 2021-12-02: qty 50

## 2021-12-02 MED ORDER — METHYLPREDNISOLONE SODIUM SUCC 125 MG IJ SOLR
125.0000 mg | INTRAMUSCULAR | Status: AC
  Administered 2021-12-02: 125 mg via INTRAVENOUS
  Filled 2021-12-02: qty 2

## 2021-12-02 MED ORDER — FUROSEMIDE 10 MG/ML IJ SOLN
40.0000 mg | Freq: Once | INTRAMUSCULAR | Status: AC
  Administered 2021-12-02: 40 mg via INTRAVENOUS
  Filled 2021-12-02: qty 4

## 2021-12-02 MED ORDER — IPRATROPIUM-ALBUTEROL 0.5-2.5 (3) MG/3ML IN SOLN
3.0000 mL | Freq: Once | RESPIRATORY_TRACT | Status: AC
  Administered 2021-12-02: 3 mL via RESPIRATORY_TRACT
  Filled 2021-12-02: qty 3

## 2021-12-02 MED ORDER — PREDNISONE 20 MG PO TABS: 40.0000 mg | ORAL_TABLET | Freq: Every day | ORAL | 0 refills | Status: DC

## 2021-12-02 MED ORDER — ALBUTEROL SULFATE (2.5 MG/3ML) 0.083% IN NEBU
5.0000 mg | INHALATION_SOLUTION | Freq: Once | RESPIRATORY_TRACT | Status: AC
  Administered 2021-12-02: 5 mg via RESPIRATORY_TRACT
  Filled 2021-12-02: qty 6

## 2021-12-02 NOTE — ED Triage Notes (Signed)
See note on 11/01/2021 at 2024.

## 2021-12-02 NOTE — Discharge Instructions (Signed)
Continue taking all of your medications as prescribed, and add on prednisone and doxycycline. Follow-up with heart failure clinic for further monitoring of your leg swelling symptoms.

## 2021-12-02 NOTE — ED Notes (Signed)
[place] and covid swab sent to the lab at this time.

## 2021-12-02 NOTE — ED Notes (Signed)
Arrived via EMS, for shortness of breath and cough x 4 days. Pale and diaphoretic on EMS arrival. Hx COPD and Afib. 4+ edema BLE. Denied chest pain or dizziness. ST on monitor at 100 97% on 3L per [place]. RR 18, 115/48 CBG 363.

## 2021-12-02 NOTE — ED Provider Notes (Signed)
[facility] Provider Note    Event Date/Time   First MD Initiated Contact with Patient 12/02/21 2257     (approximate)   History   Chief Complaint: Shortness of Breath   HPI  Jenna [name] is a 86 y.o. female with a history of COPD, GERD, peripheral edema, CKD, anxiety who comes ED complaining of shortness of breath and increased cough productive of sputum for the past 4 days.  Also reports increased bilateral lower extremity edema.  She is on furosemide which she has been compliant with.  Denies chest pain.  No fever, no syncope.  States  EMS report is that patient was pale and diaphoretic on their arrival..  Patient denies dizziness.     Physical Exam   Triage Vital Signs: ED Triage Vitals [12/02/21 1945]  Enc Vitals Group     BP (!) 141/61     Pulse Rate 96     Resp 20     Temp 98.2 F (36.8 C)     Temp Source Oral     SpO2 95 %     Weight 210 lb (95.3 kg)     Height '5\' 1"'$  (1.549 m)     Head Circumference      Peak Flow      Pain Score      Pain Loc      Pain Edu?      Excl. in [place]?     Most recent vital signs: Vitals:   12/02/21 2300 12/02/21 2330  BP: (!) 140/58 (!) 98/58  Pulse: 85 92  Resp: (!) 21 18  Temp:    SpO2: 97% 96%    General: Awake, no distress.  CV:  Good peripheral perfusion.  Regular rhythm.  Regular rate of 90. Resp:  Normal effort.  No focal crackles.  There is diffuse expiratory wheezing and prolonged expiratory phase which is accentuated with FEV1 maneuver. Abd:  No distention.  Soft nontender Other:  2+ pitting edema bilateral lower extremities, symmetric.  No inflammatory changes.  No calf tenderness.  Negative Homans' sign.   ED Results / Procedures / Treatments   Labs (all labs ordered are listed, but only abnormal results are displayed) Labs Reviewed  BASIC METABOLIC PANEL - Abnormal; Notable for the following components:      Result Value   Glucose, Bld 316 (*)    Creatinine, Ser 1.18 (*)     GFR, Estimated 46 (*)    All other components within normal limits  RESP PANEL BY RT-PCR (FLU A&B, COVID) ARPGX2  CBC  BRAIN NATRIURETIC PEPTIDE  TROPONIN I (HIGH SENSITIVITY)  TROPONIN I (HIGH SENSITIVITY)     EKG Interpreted by me Sinus rhythm, rate of 98 with PACs.  And compensatory pause afterward.  Normal axis, normal intervals.  Normal QRS ST segments and T waves, no ischemic changes.   RADIOLOGY Chest x-ray interpreted by me, unremarkable.  No edema effusion or infiltrate.  Radiology report reviewed.   PROCEDURES:  .1-3 Lead EKG Interpretation  Performed by: Carrie Mew, MD Authorized by: Carrie Mew, MD     Interpretation: normal     ECG rate:  90   ECG rate assessment: normal     Rhythm: sinus rhythm     Ectopy: none     Conduction: normal      MEDICATIONS ORDERED IN ED: Medications  methylPREDNISolone sodium succinate (SOLU-MEDROL) 125 mg/2 mL injection 125 mg (125 mg Intravenous Given 12/02/21 2140)  magnesium sulfate IVPB  2 g 50 mL (0 g Intravenous Stopped 12/02/21 2321)  furosemide (LASIX) injection 40 mg (40 mg Intravenous Given 12/02/21 2141)  ipratropium-albuterol (DUONEB) 0.5-2.5 (3) MG/3ML nebulizer solution 3 mL (3 mLs Nebulization Given 12/02/21 2337)  albuterol (PROVENTIL) (2.5 MG/3ML) 0.083% nebulizer solution 5 mg (5 mg Nebulization Given 12/02/21 2337)     IMPRESSION / MDM / ASSESSMENT AND PLAN / ED COURSE  I reviewed the triage vital signs and the nursing notes.                              Differential diagnosis includes, but is not limited to, COPD exacerbation, CHF exacerbation, pleural effusion, pulmonary edema, pneumonia, non-STEMI, electrolyte abnormality, AKI, anemia  Patient's presentation is most consistent with acute presentation with potential threat to life or bodily function.  Patient presents to the ED with increased shortness of breath and cough.  Clinically has signs of COPD exacerbation as well as CHF  exacerbation.  EKG chest x-ray and labs are all unremarkable.  Serial troponins are normal.  After Solu-Medrol, IV magnesium, and IV furosemide (which resulted in 700 mL of urine output), patient does report she is feeling better.  Will give bronchodilators as well and plan for discharge with follow-up with PCP and heart failure clinic.  Patient feels eager for discharge and continued outpatient management and feels capable of managing current symptoms.  We will send prescriptions for prednisone, doxycycline, guaifenesin       FINAL CLINICAL IMPRESSION(S) / ED DIAGNOSES   Final diagnoses:  COPD exacerbation ([place])  Acute on chronic systolic congestive heart failure ([place])     Rx / DC Orders   ED Discharge Orders          Ordered    predniSONE (DELTASONE) 20 MG tablet  Daily with breakfast        12/02/21 2340    guaiFENesin (MUCINEX) 600 MG 12 hr tablet  2 times daily        12/02/21 2340    doxycycline (VIBRAMYCIN) 100 MG capsule  2 times daily        12/02/21 2340    AMB referral to CHF clinic        12/02/21 2342             Note:  This document was prepared using Dragon voice recognition software and may include unintentional dictation errors.   Carrie Mew, MD 12/02/21 2356

## 2021-12-02 NOTE — ED Provider Notes (Signed)
----------------------------------------- 10:57 PM on 12/02/2021 -----------------------------------------  Assuming care from Dr. Joni Fears.  In short, Jenna [name] is a 86 y.o. female with a chief complaint of shortness of breath/COPD exacerbation.  Refer to the original H&P for additional details.  The current plan of care is to discharge once she is feeling better and unchanged.  She said she feels much more comfortable and wants to go home.   Clinical Course as of 12/03/21 0644  Mon Dec 03, 2021  0028 Patient was cleared for discharge and was feeling well.  However, the process of just putting on her close and swinging her legs around the side of the bed caused her to become severely short of breath with audible wheezing even without stethoscope auscultation.  I was called back to the room and she is using accessory muscles and having intercostal retractions with expiratory wheezing throughout all lung fields.  She initially claimed that this is normal for her but then she admitted it is much more severe than usual with such a minimal amount of effort.  She clearly is going to need additional treatment for her COPD exacerbation.  I ordered albuterol 5 mg nebulizer treatment and she agrees to stay.  She meets inpatient criteria at this point for COPD exacerbation and I am consulting the hospitalist for admission. [CF]  2505 Consulted by phone with Dr. Posey Pronto with the hospitalist service.  She will see the patient and admit her. [CF]    Clinical Course User Index [CF] Hinda Kehr, MD     Medications  diltiazem (CARDIZEM CD) 24 hr capsule 180 mg (has no administration in time range)  furosemide (LASIX) tablet 20 mg (has no administration in time range)  simvastatin (ZOCOR) tablet 40 mg (has no administration in time range)  busPIRone (BUSPAR) tablet 5 mg (has no administration in time range)  sertraline (ZOLOFT) tablet 100 mg (has no administration in time range)  traZODone (DESYREL)  tablet 100 mg (100 mg Oral Given 12/03/21 0159)  pantoprazole (PROTONIX) EC tablet 40 mg (has no administration in time range)  enoxaparin (LOVENOX) injection 47.5 mg (has no administration in time range)  acetaminophen (TYLENOL) tablet 650 mg (has no administration in time range)    Or  acetaminophen (TYLENOL) suppository 650 mg (has no administration in time range)  ondansetron (ZOFRAN) tablet 4 mg (has no administration in time range)    Or  ondansetron (ZOFRAN) injection 4 mg (has no administration in time range)  methylPREDNISolone sodium succinate (SOLU-MEDROL) 40 mg/mL injection 40 mg (has no administration in time range)    Followed by  predniSONE (DELTASONE) tablet 40 mg (has no administration in time range)  ipratropium-albuterol (DUONEB) 0.5-2.5 (3) MG/3ML nebulizer solution 3 mL (3 mLs Nebulization Given 12/03/21 0534)  albuterol (PROVENTIL) (2.5 MG/3ML) 0.083% nebulizer solution 2.5 mg (has no administration in time range)  insulin aspart (novoLOG) injection 0-20 Units (has no administration in time range)  insulin aspart (novoLOG) injection 0-5 Units (5 Units Subcutaneous Given 12/03/21 0159)  guaiFENesin (MUCINEX) 12 hr tablet 600 mg (600 mg Oral Given 12/03/21 0159)  methylPREDNISolone sodium succinate (SOLU-MEDROL) 125 mg/2 mL injection 125 mg (125 mg Intravenous Given 12/02/21 2140)  magnesium sulfate IVPB 2 g 50 mL (0 g Intravenous Stopped 12/02/21 2321)  furosemide (LASIX) injection 40 mg (40 mg Intravenous Given 12/02/21 2141)  ipratropium-albuterol (DUONEB) 0.5-2.5 (3) MG/3ML nebulizer solution 3 mL (3 mLs Nebulization Given 12/02/21 2337)  albuterol (PROVENTIL) (2.5 MG/3ML) 0.083% nebulizer solution 5 mg (5 mg Nebulization  Given 12/02/21 2337)  albuterol (PROVENTIL) (2.5 MG/3ML) 0.083% nebulizer solution 5 mg (5 mg Nebulization Given 12/03/21 0147)     ED Discharge Orders          Ordered    predniSONE (DELTASONE) 20 MG tablet  Daily with breakfast        12/02/21  2340    guaiFENesin (MUCINEX) 600 MG 12 hr tablet  2 times daily        12/02/21 2340    doxycycline (VIBRAMYCIN) 100 MG capsule  2 times daily        12/02/21 2340    AMB referral to CHF clinic        12/02/21 2342           Final diagnoses:  COPD exacerbation ([place])  Acute on chronic systolic congestive heart failure ([place])     Hinda Kehr, MD 12/03/21 [phone]

## 2021-12-03 ENCOUNTER — Other Ambulatory Visit: Payer: Self-pay

## 2021-12-03 ENCOUNTER — Encounter: Payer: Self-pay | Admitting: Internal Medicine

## 2021-12-03 ENCOUNTER — Observation Stay: Payer: Medicare Other

## 2021-12-03 DIAGNOSIS — Z888 Allergy status to other drugs, medicaments and biological substances status: Secondary | ICD-10-CM | POA: Diagnosis not present

## 2021-12-03 DIAGNOSIS — N1831 Chronic kidney disease, stage 3a: Secondary | ICD-10-CM

## 2021-12-03 DIAGNOSIS — Z9842 Cataract extraction status, left eye: Secondary | ICD-10-CM | POA: Diagnosis not present

## 2021-12-03 DIAGNOSIS — E1165 Type 2 diabetes mellitus with hyperglycemia: Secondary | ICD-10-CM | POA: Diagnosis not present

## 2021-12-03 DIAGNOSIS — J441 Chronic obstructive pulmonary disease with (acute) exacerbation: Secondary | ICD-10-CM | POA: Insufficient documentation

## 2021-12-03 DIAGNOSIS — I471 Supraventricular tachycardia, unspecified: Secondary | ICD-10-CM | POA: Diagnosis not present

## 2021-12-03 DIAGNOSIS — E669 Obesity, unspecified: Secondary | ICD-10-CM | POA: Diagnosis present

## 2021-12-03 DIAGNOSIS — Z9884 Bariatric surgery status: Secondary | ICD-10-CM | POA: Diagnosis not present

## 2021-12-03 DIAGNOSIS — Z8249 Family history of ischemic heart disease and other diseases of the circulatory system: Secondary | ICD-10-CM | POA: Diagnosis not present

## 2021-12-03 DIAGNOSIS — H9193 Unspecified hearing loss, bilateral: Secondary | ICD-10-CM | POA: Diagnosis present

## 2021-12-03 DIAGNOSIS — Z833 Family history of diabetes mellitus: Secondary | ICD-10-CM | POA: Diagnosis not present

## 2021-12-03 DIAGNOSIS — K219 Gastro-esophageal reflux disease without esophagitis: Secondary | ICD-10-CM | POA: Diagnosis present

## 2021-12-03 DIAGNOSIS — E785 Hyperlipidemia, unspecified: Secondary | ICD-10-CM | POA: Diagnosis present

## 2021-12-03 DIAGNOSIS — Z1152 Encounter for screening for COVID-19: Secondary | ICD-10-CM | POA: Diagnosis not present

## 2021-12-03 DIAGNOSIS — Z886 Allergy status to analgesic agent status: Secondary | ICD-10-CM | POA: Diagnosis not present

## 2021-12-03 DIAGNOSIS — R600 Localized edema: Secondary | ICD-10-CM

## 2021-12-03 DIAGNOSIS — Z885 Allergy status to narcotic agent status: Secondary | ICD-10-CM | POA: Diagnosis not present

## 2021-12-03 DIAGNOSIS — M47812 Spondylosis without myelopathy or radiculopathy, cervical region: Secondary | ICD-10-CM | POA: Diagnosis not present

## 2021-12-03 DIAGNOSIS — F419 Anxiety disorder, unspecified: Secondary | ICD-10-CM | POA: Diagnosis present

## 2021-12-03 DIAGNOSIS — Z87891 Personal history of nicotine dependence: Secondary | ICD-10-CM | POA: Diagnosis not present

## 2021-12-03 DIAGNOSIS — R531 Weakness: Secondary | ICD-10-CM | POA: Diagnosis not present

## 2021-12-03 DIAGNOSIS — R062 Wheezing: Secondary | ICD-10-CM | POA: Diagnosis not present

## 2021-12-03 DIAGNOSIS — I129 Hypertensive chronic kidney disease with stage 1 through stage 4 chronic kidney disease, or unspecified chronic kidney disease: Secondary | ICD-10-CM | POA: Diagnosis present

## 2021-12-03 DIAGNOSIS — M810 Age-related osteoporosis without current pathological fracture: Secondary | ICD-10-CM | POA: Diagnosis present

## 2021-12-03 DIAGNOSIS — Z6839 Body mass index (BMI) 39.0-39.9, adult: Secondary | ICD-10-CM | POA: Diagnosis not present

## 2021-12-03 DIAGNOSIS — Z83438 Family history of other disorder of lipoprotein metabolism and other lipidemia: Secondary | ICD-10-CM | POA: Diagnosis not present

## 2021-12-03 DIAGNOSIS — E1122 Type 2 diabetes mellitus with diabetic chronic kidney disease: Secondary | ICD-10-CM | POA: Diagnosis present

## 2021-12-03 DIAGNOSIS — I4719 Other supraventricular tachycardia: Secondary | ICD-10-CM | POA: Diagnosis present

## 2021-12-03 LAB — RESPIRATORY PANEL BY PCR

## 2021-12-03 LAB — CBC
HCT: 34.3 % — ABNORMAL LOW (ref 36.0–46.0)
Hemoglobin: 11.6 g/dL — ABNORMAL LOW (ref 12.0–15.0)
MCH: 32.1 pg (ref 26.0–34.0)
MCHC: 33.8 g/dL (ref 30.0–36.0)
MCV: 95 fL (ref 80.0–100.0)
Platelets: 213 10*3/uL (ref 150–400)
RBC: 3.61 MIL/uL — ABNORMAL LOW (ref 3.87–5.11)
RDW: 12.8 % (ref 11.5–15.5)
WBC: 7.1 10*3/uL (ref 4.0–10.5)
nRBC: 0 % (ref 0.0–0.2)

## 2021-12-03 LAB — CREATININE, SERUM
Creatinine, Ser: 1.14 mg/dL — ABNORMAL HIGH (ref 0.44–1.00)
GFR, Estimated: 47 mL/min — ABNORMAL LOW (ref >60)

## 2021-12-03 LAB — GLUCOSE, CAPILLARY: Glucose-Capillary: 193 mg/dL — ABNORMAL HIGH (ref 70–99)

## 2021-12-03 LAB — CBG MONITORING, ED
Glucose-Capillary: 356 mg/dL — ABNORMAL HIGH (ref 70–99)
Glucose-Capillary: 289 mg/dL — ABNORMAL HIGH (ref 70–99)
Glucose-Capillary: 264 mg/dL — ABNORMAL HIGH (ref 70–99)
Glucose-Capillary: 213 mg/dL — ABNORMAL HIGH (ref 70–99)

## 2021-12-03 MED ORDER — ADULT MULTIVITAMIN W/MINERALS CH
1.0000 | ORAL_TABLET | ORAL | Status: DC
  Administered 2021-12-03 – 2021-12-07 (×5): 1 via ORAL
  Filled 2021-12-03 (×5): qty 1

## 2021-12-03 MED ORDER — SIMVASTATIN 20 MG PO TABS
40.0000 mg | ORAL_TABLET | Freq: Every day | ORAL | Status: DC
  Administered 2021-12-03 – 2021-12-07 (×5): 40 mg via ORAL
  Filled 2021-12-03 (×2): qty 2
  Filled 2021-12-03: qty 4
  Filled 2021-12-03 (×2): qty 2

## 2021-12-03 MED ORDER — INSULIN ASPART 100 UNIT/ML IJ SOLN
0.0000 [IU] | Freq: Every day | INTRAMUSCULAR | Status: DC
  Administered 2021-12-03: 5 [IU] via SUBCUTANEOUS
  Administered 2021-12-04 – 2021-12-05 (×2): 3 [IU] via SUBCUTANEOUS
  Filled 2021-12-03 (×3): qty 1

## 2021-12-03 MED ORDER — ALBUTEROL SULFATE (2.5 MG/3ML) 0.083% IN NEBU
2.5000 mg | INHALATION_SOLUTION | RESPIRATORY_TRACT | Status: DC | PRN
  Administered 2021-12-03 – 2021-12-05 (×8): 2.5 mg via RESPIRATORY_TRACT
  Filled 2021-12-03 (×9): qty 3

## 2021-12-03 MED ORDER — METHYLPREDNISOLONE SODIUM SUCC 125 MG IJ SOLR
60.0000 mg | Freq: Every day | INTRAMUSCULAR | Status: DC
  Administered 2021-12-04 – 2021-12-06 (×3): 60 mg via INTRAVENOUS
  Filled 2021-12-03 (×3): qty 2

## 2021-12-03 MED ORDER — GABAPENTIN 300 MG PO CAPS
300.0000 mg | ORAL_CAPSULE | Freq: Every day | ORAL | Status: DC
  Administered 2021-12-03 – 2021-12-06 (×4): 300 mg via ORAL
  Filled 2021-12-03 (×4): qty 1

## 2021-12-03 MED ORDER — METHOCARBAMOL 500 MG PO TABS
500.0000 mg | ORAL_TABLET | Freq: Three times a day (TID) | ORAL | Status: DC | PRN
  Filled 2021-12-03: qty 1

## 2021-12-03 MED ORDER — BUSPIRONE HCL 10 MG PO TABS: 5.0000 mg | ORAL_TABLET | Freq: Three times a day (TID) | ORAL | Status: DC | PRN

## 2021-12-03 MED ORDER — ALBUTEROL SULFATE (2.5 MG/3ML) 0.083% IN NEBU
5.0000 mg | INHALATION_SOLUTION | Freq: Once | RESPIRATORY_TRACT | Status: AC
  Administered 2021-12-03: 5 mg via RESPIRATORY_TRACT
  Filled 2021-12-03: qty 6

## 2021-12-03 MED ORDER — ONDANSETRON HCL 4 MG PO TABS: 4.0000 mg | ORAL_TABLET | Freq: Four times a day (QID) | ORAL | Status: DC | PRN

## 2021-12-03 MED ORDER — PREDNISONE 20 MG PO TABS: 40.0000 mg | ORAL_TABLET | Freq: Every day | ORAL | Status: DC

## 2021-12-03 MED ORDER — ACETAMINOPHEN 325 MG PO TABS: 650.0000 mg | ORAL_TABLET | Freq: Four times a day (QID) | ORAL | Status: DC | PRN

## 2021-12-03 MED ORDER — FUROSEMIDE 20 MG PO TABS
20.0000 mg | ORAL_TABLET | Freq: Every day | ORAL | Status: DC | PRN
  Administered 2021-12-03: 20 mg via ORAL
  Filled 2021-12-03: qty 1

## 2021-12-03 MED ORDER — FERROUS SULFATE 325 (65 FE) MG PO TABS
325.0000 mg | ORAL_TABLET | Freq: Every day | ORAL | Status: DC
  Administered 2021-12-03 – 2021-12-06 (×4): 325 mg via ORAL
  Filled 2021-12-03 (×4): qty 1

## 2021-12-03 MED ORDER — INSULIN ASPART 100 UNIT/ML IJ SOLN
0.0000 [IU] | Freq: Three times a day (TID) | INTRAMUSCULAR | Status: DC
  Administered 2021-12-03: 11 [IU] via SUBCUTANEOUS
  Administered 2021-12-03: 7 [IU] via SUBCUTANEOUS
  Administered 2021-12-03 – 2021-12-04 (×2): 11 [IU] via SUBCUTANEOUS
  Administered 2021-12-04: 4 [IU] via SUBCUTANEOUS
  Administered 2021-12-04: 7 [IU] via SUBCUTANEOUS
  Administered 2021-12-05: 4 [IU] via SUBCUTANEOUS
  Administered 2021-12-05: 7 [IU] via SUBCUTANEOUS
  Administered 2021-12-05: 4 [IU] via SUBCUTANEOUS
  Administered 2021-12-06: 3 [IU] via SUBCUTANEOUS
  Administered 2021-12-06: 7 [IU] via SUBCUTANEOUS
  Administered 2021-12-07: 4 [IU] via SUBCUTANEOUS
  Filled 2021-12-03 (×12): qty 1

## 2021-12-03 MED ORDER — TRAZODONE HCL 50 MG PO TABS
100.0000 mg | ORAL_TABLET | Freq: Every day | ORAL | Status: DC
  Administered 2021-12-03 – 2021-12-06 (×5): 100 mg via ORAL
  Filled 2021-12-03: qty 1
  Filled 2021-12-03 (×4): qty 2

## 2021-12-03 MED ORDER — PANTOPRAZOLE SODIUM 40 MG PO TBEC
40.0000 mg | DELAYED_RELEASE_TABLET | Freq: Every day | ORAL | Status: DC
  Administered 2021-12-03 – 2021-12-07 (×5): 40 mg via ORAL
  Filled 2021-12-03 (×5): qty 1

## 2021-12-03 MED ORDER — ONDANSETRON HCL 4 MG/2ML IJ SOLN: 4.0000 mg | Freq: Four times a day (QID) | INTRAMUSCULAR | Status: DC | PRN

## 2021-12-03 MED ORDER — DOXYCYCLINE HYCLATE 100 MG PO TABS
100.0000 mg | ORAL_TABLET | Freq: Two times a day (BID) | ORAL | Status: DC
  Administered 2021-12-03 – 2021-12-07 (×9): 100 mg via ORAL
  Filled 2021-12-03 (×9): qty 1

## 2021-12-03 MED ORDER — STERILE WATER FOR INJECTION IJ SOLN
INTRAMUSCULAR | Status: AC
  Filled 2021-12-03: qty 10

## 2021-12-03 MED ORDER — SERTRALINE HCL 50 MG PO TABS
100.0000 mg | ORAL_TABLET | Freq: Every day | ORAL | Status: DC
  Administered 2021-12-03 – 2021-12-07 (×5): 100 mg via ORAL
  Filled 2021-12-03 (×5): qty 2

## 2021-12-03 MED ORDER — FLUTICASONE FUROATE-VILANTEROL 100-25 MCG/ACT IN AEPB
1.0000 | INHALATION_SPRAY | Freq: Every day | RESPIRATORY_TRACT | Status: DC
  Administered 2021-12-04 – 2021-12-07 (×4): 1 via RESPIRATORY_TRACT
  Filled 2021-12-03: qty 28

## 2021-12-03 MED ORDER — FAMOTIDINE 20 MG PO TABS
20.0000 mg | ORAL_TABLET | Freq: Two times a day (BID) | ORAL | Status: DC
  Administered 2021-12-03 – 2021-12-07 (×9): 20 mg via ORAL
  Filled 2021-12-03 (×9): qty 1

## 2021-12-03 MED ORDER — DILTIAZEM HCL ER COATED BEADS 180 MG PO CP24
180.0000 mg | ORAL_CAPSULE | Freq: Every day | ORAL | Status: DC
  Administered 2021-12-03 – 2021-12-07 (×5): 180 mg via ORAL
  Filled 2021-12-03 (×5): qty 1

## 2021-12-03 MED ORDER — UMECLIDINIUM BROMIDE 62.5 MCG/ACT IN AEPB
1.0000 | INHALATION_SPRAY | Freq: Every day | RESPIRATORY_TRACT | Status: DC
  Administered 2021-12-04 – 2021-12-07 (×4): 1 via RESPIRATORY_TRACT
  Filled 2021-12-03: qty 7

## 2021-12-03 MED ORDER — GUAIFENESIN-DM 100-10 MG/5ML PO SYRP
5.0000 mL | ORAL_SOLUTION | ORAL | Status: DC | PRN
  Administered 2021-12-03 – 2021-12-06 (×6): 5 mL via ORAL
  Filled 2021-12-03 (×6): qty 10

## 2021-12-03 MED ORDER — ACETAMINOPHEN 650 MG RE SUPP: 650.0000 mg | Freq: Four times a day (QID) | RECTAL | Status: DC | PRN

## 2021-12-03 MED ORDER — METHYLPREDNISOLONE SODIUM SUCC 40 MG IJ SOLR
40.0000 mg | Freq: Two times a day (BID) | INTRAMUSCULAR | Status: AC
  Administered 2021-12-03 (×2): 40 mg via INTRAVENOUS
  Filled 2021-12-03 (×2): qty 1

## 2021-12-03 MED ORDER — GUAIFENESIN ER 600 MG PO TB12
600.0000 mg | ORAL_TABLET | Freq: Two times a day (BID) | ORAL | Status: DC
  Administered 2021-12-03 – 2021-12-07 (×10): 600 mg via ORAL
  Filled 2021-12-03 (×10): qty 1

## 2021-12-03 MED ORDER — IPRATROPIUM-ALBUTEROL 0.5-2.5 (3) MG/3ML IN SOLN
3.0000 mL | Freq: Four times a day (QID) | RESPIRATORY_TRACT | Status: DC
  Administered 2021-12-03 (×3): 3 mL via RESPIRATORY_TRACT
  Filled 2021-12-03: qty 6
  Filled 2021-12-03 (×2): qty 3

## 2021-12-03 MED ORDER — ENOXAPARIN SODIUM 60 MG/0.6ML IJ SOSY
0.5000 mg/kg | PREFILLED_SYRINGE | INTRAMUSCULAR | Status: DC
  Administered 2021-12-03 – 2021-12-07 (×5): 47.5 mg via SUBCUTANEOUS
  Filled 2021-12-03 (×5): qty 0.6

## 2021-12-03 NOTE — ED Notes (Signed)
Patient eating breakfast tray at this time. 

## 2021-12-03 NOTE — Assessment & Plan Note (Addendum)
Still with bronchospasm.  Soft tissue neck x-ray negative for epiglottitis. -Continue empiric doxycycline.   -Switch Solu-Medrol with prednisone -Continue with supplemental oxygen-wean as tolerated. -Continue with bronchodilators -Increase the frequency of DuoNeb to every 4 hourly

## 2021-12-03 NOTE — Assessment & Plan Note (Addendum)
Last hemoglobin A1c 7.2 a few months ago.  Patient on sliding scale insulin.

## 2021-12-03 NOTE — Assessment & Plan Note (Signed)
Physical therapy evaluation

## 2021-12-03 NOTE — ED Notes (Signed)
Patient assisted to restroom for BM as requested. Unable to have BM at this time.

## 2021-12-03 NOTE — ED Notes (Signed)
Patient assisted to restroom. Patient ambulatory with assistance. Patient brushed teeth and applied deodorant. Dyspnea with exertion noted. New purewick applied at this time.

## 2021-12-03 NOTE — Assessment & Plan Note (Addendum)
Continue to monitor

## 2021-12-03 NOTE — Inpatient Diabetes Management (Signed)
Inpatient Diabetes Program Recommendations  AACE/ADA: New Consensus Statement on Inpatient Glycemic Control (2015)  Target Ranges:  Prepandial:   less than 140 mg/dL      Peak postprandial:   less than 180 mg/dL (1-2 hours)      Critically ill patients:  140 - 180 mg/dL    Latest Reference Range & Units 12/03/21 01:54 12/03/21 07:08 12/03/21 11:12  Glucose-Capillary 70 - 99 mg/dL  125 mg Solumedrol given '@2140'$  356 (H)  5 units Novolog  289 (H)  11 units Novolog  40 mg Solumedrol '@0804'$  264 (H)  11 units Novolog   (H): Data is abnormally high     Home DM Meds: None listed   Last A1c was 7.2% (June 2023)   Current Orders: Novolog 0-20 units TID ac/hs (started at 2am).      Of note, pt got 125 mg Solumedrol X 1 dose at 9pm last night  To start Solumedrol 40 mg BID this AM--Dose to reduce to 60 mg daily tomorrow AM 11/14   MD- While pt getting IV Steroids, please consider starting Basal insulin:  Semglee 10 units Daily (~0.1 units/kg)     --Will follow patient during hospitalization--  Wyn Quaker RN, MSN, [place] Diabetes Coordinator Inpatient Glycemic Control Team Team Pager: [phone] (8a-5p)

## 2021-12-03 NOTE — Progress Notes (Signed)
  Progress Note   Patient: Jenna [name] LAG:[id number] DOB: 04-27-1935 DOA: 12/02/2021     0 DOS: the patient was seen and examined on 12/03/2021   Brief hospital course: 86 year old female with past medical history of asthma, chronic kidney disease, COPD, GERD, osteoporosis, SVT.  She presents with shortness of breath and wheeze.  Admitted with COPD exacerbation and started on steroids.     Assessment and Plan: * COPD with acute exacerbation ([place]) Still with a lot of bronchospasm.  Start empiric doxycycline.  Continue Solu-Medrol.  Lots of upper airway congestion.  Soft tissue neck x-ray negative for epiglottitis.  Paroxysmal SVT (supraventricular tachycardia) Continue diltiazem  Uncontrolled type 2 diabetes mellitus with hyperglycemia, without long-term current use of insulin (HCC) Last hemoglobin A1c 7.2 a few months ago.  Patient on sliding scale insulin.  Weakness Physical therapy evaluation  Stage 3a chronic kidney disease (HCC) Renal function at baseline  Bilateral leg edema Continue to monitor.        Subjective: Patient with shortness of breath cough and wheezing starting from yesterday.  Still with some wheezing when trying to talk.  Coughing up some whitish phlegm.  Feeling short of breath.  Physical Exam: Vitals:   12/03/21 0901 12/03/21 1000 12/03/21 1100 12/03/21 1119  BP: 131/61 (!) 125/50 105/80   Pulse:  97 97   Resp:  18 18   Temp:    97.9 F (36.6 C)  TempSrc:    Oral  SpO2:  93% 94%   Weight:      Height:       Physical Exam HENT:     Head: Normocephalic.     Mouth/Throat:     Pharynx: No oropharyngeal exudate.  Eyes:     General: Lids are normal.     Conjunctiva/sclera: Conjunctivae normal.  Cardiovascular:     Rate and Rhythm: Normal rate and regular rhythm.     Heart sounds: Normal heart sounds, S1 normal and S2 normal.  Pulmonary:     Breath sounds: Transmitted upper airway sounds present. Examination of the right-middle field  reveals wheezing. Examination of the left-middle field reveals wheezing. Examination of the right-lower field reveals decreased breath sounds and wheezing. Examination of the left-lower field reveals decreased breath sounds and wheezing. Decreased breath sounds and wheezing present. No rhonchi or rales.  Abdominal:     Palpations: Abdomen is soft.     Tenderness: There is no abdominal tenderness.  Musculoskeletal:     Right lower leg: No swelling.     Left lower leg: No swelling.  Skin:    General: Skin is warm.     Findings: No rash.  Neurological:     Mental Status: She is alert and oriented to person, place, and time.     Data Reviewed: Soft tissue neck negative.  Chest x-ray negative.  Creatinine 1.14, troponin x2 negative BNP 75  Family Communication: Updated Daughter on the phone  Disposition: Status is: Observation Still with bronchospasm.  Reassess tomorrow  Planned Discharge Destination: Home    Time spent: 28 minutes  Author: Loletha Grayer, MD 12/03/2021 11:35 AM  For on call review [email or web address].

## 2021-12-03 NOTE — Assessment & Plan Note (Signed)
Renal function at baseline 

## 2021-12-03 NOTE — Assessment & Plan Note (Signed)
Continue diltiazem. 

## 2021-12-03 NOTE — Hospital Course (Addendum)
86 year old female with past medical history of asthma, chronic kidney disease, COPD, GERD, osteoporosis, SVT.  She presents with shortness of breath and wheeze.  Admitted with COPD exacerbation and started on steroids.  Chest x-ray negative for pneumonia, neck x-ray negative for epiglottitis.  Viral respiratory panel negative, COVID and influenza also negative.  Patient admitted for COPD exacerbation.  11/15: Patient continued to have wheeze and becoming short of breath with minor exertion, blood glucose elevated at 253.  Increasing the dose of cynically and adding 4 units with meal. Patient is on steroid.  Required 2 L of oxygen  11/16: Patient continued to have wheeze but able to wean off to room air while working with PT.  Still feeling short of breath with minor exertion but maintaining saturation. Most likely will get benefit with little prolonged tapering course of steroid. PT is recommending home health which is being ordered. Also had little worsening of lower extremity edema-giving 1 dose of 40 mg IV Lasix.  11/17: Patient was feeling little improved when seen today.  Wheezing much improved but continued to have mild scattered wheeze.  She continued to desaturate with ambulation requiring 2 to 3 L of oxygen.  Patient would like to go home so she is being discharged on a tapering dose of steroid and 5 more days of doxycycline. She is also being given home oxygen to be used all the time to keep the saturation above 90%.  Slight increase in creatinine after getting IV Lasix yesterday, no more IV Lasix today and she will resume her home Lasix from tomorrow.  Patient will continue with current medications and need to have a close follow-up with her providers for further recommendations.

## 2021-12-03 NOTE — H&P (Signed)
History and Physical    Patient: Jenna [name] KPT:[id number] DOB: 06-23-1935 DOA: 12/02/2021 DOS: the patient was seen and examined on 12/03/2021 PCP: Olin Hauser, DO  Patient coming from: Home  Chief Complaint:  Chief Complaint  Patient presents with   Shortness of Breath    HPI: Dacia Capers is a 86 y.o. female with medical history significant for COPD, non-insulin-dependent type 2 diabetes, chronic lower extremity edema, obesity with history of gastric bypass, HTN, ex-smoker, paroxysmal tachycardia on scheduled and as needed metoprolol, who presents to the ED ED with shortness of breath, cough and wheezing as well as increase in her chronic lower extremity edema.  She denies chest pain.  She has had no fever or chills.  Denies leg pains ED course and data review: BP 141/61 with otherwise normal vitals.  Respiratory rate did go up to 23 but O2 sat remained in the high 90s on room air.  Labs: Troponin 6-3, BNP 75.  CBC WNL, BMP with glucose 316 and creatinine at baseline at 1.18.  COVID and flu negative. EKG, personally viewed and interpreted shows sinus rhythm at 98 with nonspecific ST-T wave changes.  Chest x-ray was nonacute Patient was treated with DuoNebs, methylprednisolone and magnesium and she was also given a dose of IV Lasix 40 mg. Patient was about to be discharged from the ED however when ambulated she became very tachypneic and short short of breath so hospitalization requested.   Review of Systems: As mentioned in the history of present illness. All other systems reviewed and are negative.  Past Medical History:  Diagnosis Date   Anxiety    Arthritis    neck, knees(before replacements)   Asthma    CKD (chronic kidney disease), stage III (HCC)    COPD (chronic obstructive pulmonary disease) ([place])    Encounter for colonoscopy due to history of adenomatous colonic polyps    GERD (gastroesophageal reflux disease)    Hearing loss of both ears    History  of echocardiogram    a. 10/2013 Echo: EF 60-65%, no rwma, midlly dil LA. Rnl RV fxn.   Hyperlipidemia    Osteoporosis    Palpitations    PSVT (paroxysmal supraventricular tachycardia)    a. 05/2018 [facility]: 99 SVT runs. Fastest 218 x 4:30. Longest 4:38 w/ rate of 172.   Sleep apnea    resolved with gastric bypass   Syncope and collapse    Urine incontinence    Past Surgical History:  Procedure Laterality Date   BROW LIFT Bilateral 06/27/2015   Procedure: BLEPHAROPLASTY BILATERAL UPPER EYELIDS BILATERAL BLEPHAROTOSIS EYELIDS;  Surgeon: Karle Starch, MD;  Location: [facility];  Service: Ophthalmology;  Laterality: Bilateral;  BILATERAL   CATARACT EXTRACTION W/PHACO Right 02/13/2015   Procedure: CATARACT EXTRACTION PHACO AND INTRAOCULAR LENS PLACEMENT (IOC);  Surgeon: Ronnell Freshwater, MD;  Location: [facility];  Service: Ophthalmology;  Laterality: Right;   CATARACT EXTRACTION W/PHACO Left 03/22/2015   Procedure: CATARACT EXTRACTION PHACO AND INTRAOCULAR LENS PLACEMENT (IOC);  Surgeon: Ronnell Freshwater, MD;  Location: [facility];  Service: Ophthalmology;  Laterality: Left;  [place]   COLECTOMY     COSMETIC SURGERY  2012   tummy tuck and excess skin removal   GASTRIC BYPASS  2010   REPLACEMENT TOTAL KNEE BILATERAL  1998   SHOULDER SURGERY  2009   left    TONSILLECTOMY     TOTAL ABDOMINAL HYSTERECTOMY  1979   Social  History:  reports that she quit smoking about 40 years ago. Her smoking use included cigarettes. She has a 20.00 pack-year smoking history. She has quit using smokeless tobacco. She reports that she does not currently use alcohol. She reports that she does not use drugs.  Allergies  Allergen Reactions   Azithromycin Other (See Comments)    Causes stomach burning pains   Hydromorphone Other (See Comments)    confusion, personality change   Morphine Other (See Comments)    Goes crazy   Nsaids Other (See Comments)     Avoids because of gastric bypass surgery   Oxycodone-Acetaminophen Nausea And Vomiting   Codeine Other (See Comments) and Nausea And Vomiting    "will not stay down"   Nabumetone Rash   Oxycodone Nausea And Vomiting   Oxycodone-Acetaminophen Nausea And Vomiting   Promethazine Hcl Rash and Other (See Comments)    Family History  Problem Relation Age of Onset   Heart attack Mother    Hypertension Mother    Diabetes type II Mother    Pneumonia Father    Skin cancer Father    Diabetes type II Father    Colon cancer Sister    Ovarian cancer Sister    Heart attack Brother    Heart attack Sister 30   Hyperlipidemia Sister    Hypertension Sister     Prior to Admission medications   Medication Sig Start Date End Date Taking? Authorizing Provider  doxycycline (VIBRAMYCIN) 100 MG capsule Take 1 capsule (100 mg total) by mouth 2 (two) times daily for 10 days. 12/02/21 12/12/21 Yes Carrie Mew, MD  guaiFENesin (MUCINEX) 600 MG 12 hr tablet Take 1 tablet (600 mg total) by mouth 2 (two) times daily for 15 days. 12/02/21 12/17/21 Yes Carrie Mew, MD  predniSONE (DELTASONE) 20 MG tablet Take 2 tablets (40 mg total) by mouth daily with breakfast for 4 days. 12/02/21 12/06/21 Yes Carrie Mew, MD  albuterol (PROVENTIL) (2.5 MG/3ML) 0.083% nebulizer solution Take 3 mLs (2.5 mg total) by nebulization every 6 (six) hours as needed for wheezing or shortness of breath. 05/21/21 05/21/22  Loletha Grayer, MD  albuterol (VENTOLIN HFA) 108 (90 Base) MCG/ACT inhaler Inhale 2 puffs into the lungs every 6 (six) hours as needed for wheezing or shortness of breath. 02/12/21 05/17/21  Karamalegos, Devonne Doughty, DO  alendronate (FOSAMAX) 70 MG tablet Take 1 tablet (70 mg total) by mouth once a week. Take with a full glass of water on an empty stomach. 07/11/21   Karamalegos, Devonne Doughty, DO  Ascorbic Acid (VITAMIN C) 1000 MG tablet Take 500 mg by mouth daily.     [provider]  busPIRone  (BUSPAR) 5 MG tablet Take 1 tablet (5 mg total) by mouth 3 (three) times daily as needed (anxiety). 07/11/21   Karamalegos, Devonne Doughty, DO  Cholecalciferol 25 MCG (1000 UT) tablet Take 1,000 Units by mouth daily.     [provider]  Cyanocobalamin 1000 MCG TBCR Take 1,000 mcg by mouth daily.     [provider]  diltiazem (CARDIZEM CD) 180 MG 24 hr capsule Take 1 capsule (180 mg total) by mouth daily. 09/03/21   Minna Merritts, MD  famotidine (PEPCID) 20 MG tablet Take 20 mg by mouth 2 (two) times daily.    [provider]  ferrous sulfate 325 (65 FE) MG EC tablet Take 325 mg by mouth at bedtime.     [provider]  furosemide (LASIX) 20 MG tablet Take 1  tablet (20 mg total) by mouth daily as needed for fluid or edema. Take lasix daily, with extra dose as needed for swelling 11/21/21   Karamalegos, Devonne Doughty, DO  gabapentin (NEURONTIN) 300 MG capsule Take 1 capsule (300 mg total) by mouth at bedtime. 05/31/21   Karamalegos, Devonne Doughty, DO  ipratropium-albuterol (DUONEB) 0.5-2.5 (3) MG/3ML SOLN Take 3 mLs by nebulization every 4 (four) hours as needed. 11/09/21   Karamalegos, Devonne Doughty, DO  methocarbamol (ROBAXIN) 500 MG tablet Take 1 tablet (500 mg total) by mouth every 8 (eight) hours as needed for muscle spasms. 04/06/21   Karamalegos, Devonne Doughty, DO  Multiple Vitamins-Minerals (ONE-A-DAY WOMENS PETITES) TABS Take 2 tablets by mouth every morning.    [provider]  MYRBETRIQ 25 MG TB24 tablet Take 1 tablet (25 mg total) by mouth daily. 08/21/21   Karamalegos, Devonne Doughty, DO  Omega-3 Fatty Acids (FISH OIL) 1000 MG CAPS Take 1,000 mg by mouth daily.    [provider]  pantoprazole (PROTONIX) 40 MG tablet Take 1 tablet (40 mg total) by mouth daily. 06/25/21   Karamalegos, Devonne Doughty, DO  predniSONE (DELTASONE) 20 MG tablet Take daily with food. Start with '60mg'$  (3 pills) x 2 days, then reduce to '40mg'$  (2 pills) x 2 days, then '20mg'$  (1 pill) x 3  days 11/09/21   Olin Hauser, DO  sertraline (ZOLOFT) 100 MG tablet Take 1 tablet (100 mg total) by mouth daily. 05/25/21   Karamalegos, Devonne Doughty, DO  simvastatin (ZOCOR) 40 MG tablet Take 1 tablet (40 mg total) by mouth daily. 05/11/21   Karamalegos, Devonne Doughty, DO  SUPER B COMPLEX/C PO Take by mouth.    [provider]  traZODone (DESYREL) 100 MG tablet Take 1 tablet (100 mg total) by mouth at bedtime. 02/12/21   Karamalegos, Alexander J, DO  TRELEGY ELLIPTA 100-62.5-25 MCG/INH AEPB Inhale 1 puff into the lungs daily. 07/31/20   [provider]    Physical Exam: Vitals:   12/02/21 2156 12/02/21 2230 12/02/21 2300 12/02/21 2330  BP: (!) 139/54 (!) 132/55 (!) 140/58 (!) 98/58  Pulse: 88 86 85 92  Resp: (!) 21 (!) 23 (!) 21 18  Temp:      TempSrc:      SpO2: 96% 97% 97% 96%  Weight:      Height:       Physical Exam Vitals and nursing note reviewed.  Constitutional:      General: She is not in acute distress. HENT:     Head: Normocephalic and atraumatic.  Cardiovascular:     Rate and Rhythm: Normal rate and regular rhythm.     Heart sounds: Normal heart sounds.  Pulmonary:     Effort: Pulmonary effort is normal.     Breath sounds: Rhonchi present.  Abdominal:     Palpations: Abdomen is soft.     Tenderness: There is no abdominal tenderness.  Neurological:     Mental Status: Mental status is at baseline.     Labs on Admission: I have personally reviewed following labs and imaging studies  CBC: Recent Labs  Lab 12/02/21 1946  WBC 7.0  HGB 12.6  HCT 38.1  MCV 97.2  PLT 269   Basic Metabolic Panel: Recent Labs  Lab 12/02/21 1946  NA 142  K 3.8  CL 103  CO2 27  GLUCOSE 316*  BUN 15  CREATININE 1.18*  CALCIUM 8.9   GFR: Estimated Creatinine Clearance: 37.4 mL/min (A) (by C-G formula based  on SCr of 1.18 mg/dL (H)). Liver Function Tests: No results for input(s): "AST", "ALT", "ALKPHOS", "BILITOT", "PROT", "ALBUMIN" in the last  168 hours. No results for input(s): "LIPASE", "AMYLASE" in the last 168 hours. No results for input(s): "AMMONIA" in the last 168 hours. Coagulation Profile: No results for input(s): "INR", "PROTIME" in the last 168 hours. Cardiac Enzymes: No results for input(s): "CKTOTAL", "CKMB", "CKMBINDEX", "TROPONINI" in the last 168 hours. BNP (last 3 results) No results for input(s): "PROBNP" in the last 8760 hours. HbA1C: No results for input(s): "HGBA1C" in the last 72 hours. CBG: No results for input(s): "GLUCAP" in the last 168 hours. Lipid Profile: No results for input(s): "CHOL", "HDL", "LDLCALC", "TRIG", "CHOLHDL", "LDLDIRECT" in the last 72 hours. Thyroid Function Tests: No results for input(s): "TSH", "T4TOTAL", "FREET4", "T3FREE", "THYROIDAB" in the last 72 hours. Anemia Panel: No results for input(s): "VITAMINB12", "FOLATE", "FERRITIN", "TIBC", "IRON", "RETICCTPCT" in the last 72 hours. Urine analysis:    Component Value Date/Time   COLORURINE STRAW (A) 11/02/2021 0511   APPEARANCEUR HAZY (A) 11/02/2021 0511   LABSPEC 1.031 (H) 11/02/2021 0511   PHURINE 6.0 11/02/2021 0511   GLUCOSEU NEGATIVE 11/02/2021 0511   HGBUR NEGATIVE 11/02/2021 0511   BILIRUBINUR NEGATIVE 11/02/2021 0511   KETONESUR 5 (A) 11/02/2021 0511   PROTEINUR NEGATIVE 11/02/2021 0511   NITRITE NEGATIVE 11/02/2021 0511   LEUKOCYTESUR MODERATE (A) 11/02/2021 0511    Radiological Exams on Admission: DG Chest 2 View  Result Date: 12/02/2021 CLINICAL DATA:  Shortness of breath and cough. EXAM: CHEST - 2 VIEW COMPARISON:  November 01, 2021 FINDINGS: The heart size and mediastinal contours are within normal limits. There is mild, stable elevation of the right hemidiaphragm. Both lungs are clear. Radiopaque surgical clips are seen within the right upper quadrant. Multilevel degenerative changes are noted throughout the thoracic spine. IMPRESSION: No active cardiopulmonary disease. Electronically Signed   By: Virgina Norfolk M.D.   On: 12/02/2021 20:36     Data Reviewed: Relevant notes from primary care and specialist visits, past discharge summaries as available in EHR, including Care Everywhere. Prior diagnostic testing as pertinent to current admission diagnoses Updated medications and problem lists for reconciliation ED course, including vitals, labs, imaging, treatment and response to treatment Triage notes, nursing and pharmacy notes and ED provider's notes Notable results as noted in HPI   Assessment and Plan: * COPD with acute exacerbation ([place]) Schedule and as needed nebulized bronchodilator treatments IV steroids Antitussives, flutter valve, incentive spirometry  Paroxysmal SVT (supraventricular tachycardia) Continue diltiazem  Uncontrolled type 2 diabetes mellitus with hyperglycemia, without long-term current use of insulin (HCC) Sliding scale insulin coverage  Stage 3a chronic kidney disease ([place]) Renal function at baseline  Bilateral leg edema Chronic suspect secondary to venous insufficiency Patient takes as needed Lasix Patient had an echo in April 2023 that showed EF 60 to 65% with normal diastolic parameters        DVT prophylaxis: Lovenox  Consults: none  Advance Care Planning:   Code Status: Prior   Family Communication: none  Disposition Plan: Back to previous home environment  Severity of Illness: The appropriate patient status for this patient is OBSERVATION. Observation status is judged to be reasonable and necessary in order to provide the required intensity of service to ensure the patient's safety. The patient's presenting symptoms, physical exam findings, and initial radiographic and laboratory data in the context of their medical condition is felt to place them at decreased risk for further clinical deterioration.  Furthermore, it is anticipated that the patient will be medically stable for discharge from the hospital within 2 midnights of admission.    Author: Athena Masse, MD 12/03/2021 1:30 AM  For on call review [email or web address].

## 2021-12-04 DIAGNOSIS — E669 Obesity, unspecified: Secondary | ICD-10-CM

## 2021-12-04 LAB — GLUCOSE, CAPILLARY
Glucose-Capillary: 280 mg/dL — ABNORMAL HIGH (ref 70–99)
Glucose-Capillary: 229 mg/dL — ABNORMAL HIGH (ref 70–99)
Glucose-Capillary: 199 mg/dL — ABNORMAL HIGH (ref 70–99)
Glucose-Capillary: 264 mg/dL — ABNORMAL HIGH (ref 70–99)

## 2021-12-04 MED ORDER — INSULIN GLARGINE-YFGN 100 UNIT/ML ~~LOC~~ SOLN
10.0000 [IU] | Freq: Every day | SUBCUTANEOUS | Status: DC
  Administered 2021-12-04 – 2021-12-05 (×2): 10 [IU] via SUBCUTANEOUS
  Filled 2021-12-04 (×2): qty 0.1

## 2021-12-04 MED ORDER — IPRATROPIUM-ALBUTEROL 0.5-2.5 (3) MG/3ML IN SOLN
3.0000 mL | Freq: Four times a day (QID) | RESPIRATORY_TRACT | Status: DC
  Administered 2021-12-04 – 2021-12-05 (×5): 3 mL via RESPIRATORY_TRACT
  Filled 2021-12-04 (×5): qty 3

## 2021-12-04 NOTE — Assessment & Plan Note (Signed)
BMI 39.68

## 2021-12-04 NOTE — Evaluation (Signed)
Physical Therapy Evaluation Patient Details Name: Jenna [name] MRN: [id number] DOB: 03-Mar-1935 Today's Date: 12/04/2021  History of Present Illness  Jenna [name] is a 86 y.o. female with medical history significant for COPD, non-insulin-dependent type 2 diabetes, chronic lower extremity edema, obesity with history of gastric bypass, HTN, ex-smoker, paroxysmal tachycardia on scheduled and as needed metoprolol, who presents to the ED with shortness of breath, cough and wheezing as well as increase in her chronic lower extremity edema.   Clinical Impression  Pt admitted with above diagnosis. Pt seated upright in bed agreeable to PT eval with pts spouse at bedside. Pt with wheezing at rest, has SOB with talking initially reporting subjective. At baseline pt is indep with gait at household distances but is a furniture walker. Unable to really use AD in home due to smallness of home and narrow doorways. Otherwise indep with ADL's/IADL's. Pt and spouse reports multiple falls at home but husband states this is mostly due to pt not paying attention, holding items in hands when asc/desc step to get in/out of kitchen.   To date, pt transfers to EOB with supervision and bed features, stands with [place] to RW with VC's for hand placement. Pt able to tolerate 80' of gait with safe use of RW at supervision level and performing toileting independently. Pt returning to recliner with SOB and increased audible wheezing with activity. Education provided on use of RW to prevent furniture walking, improve energy conservation. Trouble shooting with husband and spouse on ways to reduce falls in household and seems like space capacity is limiting use of AD in home. Education provided on balance screen on next session and maybe trialing SPC to reduce falls. Pt and spouse understanding. Pt currently with functional limitations due to the deficits listed below (see PT Problem List). Pt will benefit from skilled PT to  increase their independence and safety with mobility to allow discharge to the venue listed below.     Recommendations for follow up therapy are one component of a multi-disciplinary discharge planning process, led by the attending physician.  Recommendations may be updated based on patient status, additional functional criteria and insurance authorization.  Follow Up Recommendations Home health PT      Assistance Recommended at Discharge Frequent or constant Supervision/Assistance  Patient can return home with the following  Assistance with cooking/housework;Assist for transportation    Equipment Recommendations None recommended by PT  Recommendations for Other Services       Functional Status Assessment Patient has had a recent decline in their functional status and demonstrates the ability to make significant improvements in function in a reasonable and predictable amount of time.     Precautions / Restrictions Precautions Precautions: Fall Restrictions Weight Bearing Restrictions: No      Mobility  Bed Mobility Overal bed mobility: Needs Assistance Bed Mobility: Supine to Sit     Supine to sit: Supervision, HOB elevated       Patient Response: Cooperative  Transfers Overall transfer level: Needs assistance Equipment used: Rolling walker (2 wheels) Transfers: Sit to/from Stand Sit to Stand: Min guard           General transfer comment: VC's for hand placement    Ambulation/Gait Ambulation/Gait assistance: Supervision Gait Distance (Feet): 80 Feet Assistive device: Rolling walker (2 wheels) Gait Pattern/deviations: Step-through pattern, Decreased step length - right, Decreased step length - left          Stairs  Wheelchair Mobility    Modified Rankin (Stroke Patients Only)       Balance Overall balance assessment: Needs assistance Sitting-balance support: Bilateral upper extremity supported, Feet supported Sitting  balance-Leahy Scale: Good     Standing balance support: Bilateral upper extremity supported Standing balance-Leahy Scale: Fair                               Pertinent Vitals/Pain Pain Assessment Pain Assessment: No/denies pain    Home Living Family/patient expects to be discharged to:: Private residence Living Arrangements: Spouse/significant other Available Help at Discharge: Family;Available 24 hours/day;Available PRN/intermittently Type of Home: House Home Access: Ramped entrance       Home Layout: One level Home Equipment: Conservation officer, nature (2 wheels);Rollator (4 wheels);Wheelchair - power      Prior Function Prior Level of Function : Independent/Modified Independent             Mobility Comments: Forensic psychologist for long distance community outtings, furniture surfs within the home, RW for short distance community outtings ADLs Comments: no assistance needed     Hand Dominance   Dominant Hand: Right    Extremity/Trunk Assessment   Upper Extremity Assessment Upper Extremity Assessment: Overall WFL for tasks assessed    Lower Extremity Assessment Lower Extremity Assessment: Generalized weakness    Cervical / Trunk Assessment Cervical / Trunk Assessment: Normal  Communication   Communication: No difficulties  Cognition Arousal/Alertness: Awake/alert Behavior During Therapy: WFL for tasks assessed/performed Overall Cognitive Status: Within Functional Limits for tasks assessed                                          General Comments General comments (skin integrity, edema, etc.): SPO2 > 90% on RA throughout session. Does have worsened wheezing and SOB post ambulation.    Exercises Other Exercises Other Exercises: Role of PT in acute setting, d/c recs, energy conservation techniques, use of RW to prevent falls.   Assessment/Plan    PT Assessment Patient needs continued PT services  PT Problem List Decreased  strength;Cardiopulmonary status limiting activity;Decreased mobility;Decreased activity tolerance       PT Treatment Interventions Balance training;DME instruction;Gait training;Neuromuscular re-education;Functional mobility training;Patient/family education;Therapeutic activities;Therapeutic exercise    PT Goals (Current goals can be found in the Care Plan section)  Acute Rehab PT Goals Patient Stated Goal: improve breathing, return home PT Goal Formulation: With patient Time For Goal Achievement: 12/18/21 Potential to Achieve Goals: Good    Frequency Min 2X/week     Co-evaluation               AM-PAC PT "6 Clicks" Mobility  Outcome Measure Help needed turning from your back to your side while in a flat bed without using bedrails?: A Little Help needed moving from lying on your back to sitting on the side of a flat bed without using bedrails?: A Little Help needed moving to and from a bed to a chair (including a wheelchair)?: A Little Help needed standing up from a chair using your arms (e.g., wheelchair or bedside chair)?: A Little Help needed to walk in hospital room?: A Little Help needed climbing 3-5 steps with a railing? : A Lot 6 Click Score: 17    End of Session Equipment Utilized During Treatment: Gait belt Activity Tolerance: Patient tolerated treatment well Patient left: in chair;with  call bell/phone within reach;with chair alarm set;with family/visitor present Nurse Communication: Mobility status PT Visit Diagnosis: Other abnormalities of gait and mobility (R26.89);Muscle weakness (generalized) (M62.81)    Time: 9794-8016 PT Time Calculation (min) (ACUTE ONLY): 29 min   Charges:     PT Treatments $Therapeutic Exercise: 23-37 mins       Adiba Fargnoli M. Fairly IV, PT, DPT Physical Therapist- [facility]  12/04/2021, 11:45 AM

## 2021-12-04 NOTE — Progress Notes (Signed)
  Progress Note   Patient: Jenna [name] WUJ:[id number] DOB: Apr 08, 1935 DOA: 12/02/2021     1 DOS: the patient was seen and examined on 12/04/2021   Brief hospital course: 86 year old female with past medical history of asthma, chronic kidney disease, COPD, GERD, osteoporosis, SVT.  She presents with shortness of breath and wheeze.  Admitted with COPD exacerbation and started on steroids.  Chest x-ray negative for pneumonia, neck x-ray negative for epiglottitis.  Viral respiratory panel negative, COVID and influenza also negative.     Assessment and Plan: * COPD with acute exacerbation ([place]) Still with bronchospasm.  Continue empiric doxycycline.  Continue Solu-Medrol.  Lots of upper airway congestion.  Soft tissue neck x-ray negative for epiglottitis.  Paroxysmal SVT (supraventricular tachycardia) Continue diltiazem  Uncontrolled type 2 diabetes mellitus with hyperglycemia, without long-term current use of insulin (HCC) Last hemoglobin A1c 7.2 a few months ago.  Patient on sliding scale insulin.  We will add low-dose Semglee insulin while on steroids.  Weakness Physical therapy evaluation appreciated recommending home with home health.  Stage 3a chronic kidney disease ([place]) Renal function at baseline  Bilateral leg edema Continue to monitor.  Obesity (BMI 35.0-39.9 without comorbidity) BMI 39.68        Subjective: Patient still has some upper airway congestion and wheeze.  Had a rough night last night with the breathing.  Still coughing up some phlegm.  Not feeling well.  Physical Exam: Vitals:   12/03/21 2108 12/04/21 0141 12/04/21 0417 12/04/21 0741  BP: (!) 116/57  (!) 128/58 (!) 147/132  Pulse: 82  80 98  Resp: '18  18 20  '$ Temp: 98.3 F (36.8 C)  97.9 F (36.6 C) (!) 97.5 F (36.4 C)  TempSrc: Oral     SpO2: 94% 95% 93% 98%  Weight:      Height: '5\' 1"'$  (1.549 m)      Physical Exam HENT:     Head: Normocephalic.     Mouth/Throat:     Pharynx: No  oropharyngeal exudate.  Eyes:     General: Lids are normal.     Conjunctiva/sclera: Conjunctivae normal.  Cardiovascular:     Rate and Rhythm: Normal rate and regular rhythm.     Heart sounds: Normal heart sounds, S1 normal and S2 normal.  Pulmonary:     Breath sounds: Transmitted upper airway sounds present. Examination of the right-middle field reveals wheezing. Examination of the left-middle field reveals wheezing. Examination of the right-lower field reveals decreased breath sounds and wheezing. Examination of the left-lower field reveals decreased breath sounds and wheezing. Decreased breath sounds and wheezing present. No rhonchi or rales.  Abdominal:     Palpations: Abdomen is soft.     Tenderness: There is no abdominal tenderness.  Musculoskeletal:     Right lower leg: No swelling.     Left lower leg: No swelling.  Skin:    General: Skin is warm.     Findings: No rash.  Neurological:     Mental Status: She is alert and oriented to person, place, and time.     Data Reviewed: Neck x-ray negative for epiglottitis.  Chest x-ray negative.  Family Communication: Spoke with husband at the bedside  Disposition: Status is: Inpatient Remains inpatient appropriate because: Still with wheezing and bronchospasm requiring IV Solu-Medrol.  Planned Discharge Destination: Home with Home Health    Time spent: 28 minutes  Author: Loletha Grayer, MD 12/04/2021 12:10 PM  For on call review [email or web address].

## 2021-12-04 NOTE — TOC Progression Note (Signed)
Transition of Care [facility]) - Progression Note    Patient Details  Name: Jenna [name] MRN: [id number] Date of Birth: 1935-07-01  Transition of Care [facility]) CM/SW [place], [place] Phone Number: 12/04/2021, 1:27 PM  Clinical Narrative:     TOC met with the patient at bedside. She lives on [address] in [place], [place] [address]. The patient has a nebulizer, hover wheelchair and she drives to medical appointments. Her husband takes the wheelchair with them in the car and assists her with the wheelchair.   TOC provided the patient with a home health choice list. She will review and get back to [facility] regarding her choice of home health.       Expected Discharge Plan and [place] PT                                         Social Determinants of Health (SDOH) Interventions    Readmission Risk Interventions     No data to display

## 2021-12-05 LAB — BASIC METABOLIC PANEL
Anion gap: 7 (ref 5–15)
BUN: 31 mg/dL — ABNORMAL HIGH (ref 8–23)
CO2: 29 mmol/L (ref 22–32)
Calcium: 8.5 mg/dL — ABNORMAL LOW (ref 8.9–10.3)
Chloride: 104 mmol/L (ref 98–111)
Creatinine, Ser: 1.13 mg/dL — ABNORMAL HIGH (ref 0.44–1.00)
GFR, Estimated: 48 mL/min — ABNORMAL LOW (ref >60)
Glucose, Bld: 253 mg/dL — ABNORMAL HIGH (ref 70–99)
Potassium: 3.8 mmol/L (ref 3.5–5.1)
Sodium: 140 mmol/L (ref 135–145)

## 2021-12-05 LAB — CBC
HCT: 33.6 % — ABNORMAL LOW (ref 36.0–46.0)
Hemoglobin: 11.4 g/dL — ABNORMAL LOW (ref 12.0–15.0)
MCH: 32.1 pg (ref 26.0–34.0)
MCHC: 33.9 g/dL (ref 30.0–36.0)
MCV: 94.6 fL (ref 80.0–100.0)
Platelets: 228 10*3/uL (ref 150–400)
RBC: 3.55 MIL/uL — ABNORMAL LOW (ref 3.87–5.11)
RDW: 13.2 % (ref 11.5–15.5)
WBC: 10.4 10*3/uL (ref 4.0–10.5)
nRBC: 0 % (ref 0.0–0.2)

## 2021-12-05 LAB — GLUCOSE, CAPILLARY
Glucose-Capillary: 244 mg/dL — ABNORMAL HIGH (ref 70–99)
Glucose-Capillary: 187 mg/dL — ABNORMAL HIGH (ref 70–99)
Glucose-Capillary: 168 mg/dL — ABNORMAL HIGH (ref 70–99)
Glucose-Capillary: 270 mg/dL — ABNORMAL HIGH (ref 70–99)

## 2021-12-05 MED ORDER — IPRATROPIUM-ALBUTEROL 0.5-2.5 (3) MG/3ML IN SOLN
3.0000 mL | RESPIRATORY_TRACT | Status: DC
  Administered 2021-12-05 – 2021-12-07 (×10): 3 mL via RESPIRATORY_TRACT
  Filled 2021-12-05 (×10): qty 3

## 2021-12-05 MED ORDER — INSULIN GLARGINE-YFGN 100 UNIT/ML ~~LOC~~ SOLN
15.0000 [IU] | Freq: Every day | SUBCUTANEOUS | Status: DC
  Administered 2021-12-06 – 2021-12-07 (×2): 15 [IU] via SUBCUTANEOUS
  Filled 2021-12-05 (×2): qty 0.15

## 2021-12-05 MED ORDER — LIVING WELL WITH DIABETES BOOK
Freq: Once | Status: AC
  Filled 2021-12-05 (×2): qty 1

## 2021-12-05 MED ORDER — INSULIN ASPART 100 UNIT/ML IJ SOLN
4.0000 [IU] | Freq: Three times a day (TID) | INTRAMUSCULAR | Status: DC
  Administered 2021-12-06 – 2021-12-07 (×5): 4 [IU] via SUBCUTANEOUS
  Filled 2021-12-05 (×5): qty 1

## 2021-12-05 NOTE — Progress Notes (Signed)
Mobility Specialist - Progress Note   12/05/21 0937  Mobility  Activity Ambulated with assistance in hallway;Stood at bedside;Dangled on edge of bed;Ambulated with assistance to bathroom  Level of Assistance Standby assist, set-up cues, supervision of patient - no hands on  Assistive Device Front wheel walker  Distance Ambulated (ft) 70 ft  Activity Response Tolerated well  Mobility Referral Yes  $Mobility charge 1 Mobility   Pt EOB on 2L upon arrival. Pt ambulates in hallway SBA with no LOB noted. Pt endorses SOB and returns to room. RN Notified. Pt ambulates to restroom and is educated on how to call for assistance.   Gretchen Short  Mobility Specialist  12/05/21 9:39 AM

## 2021-12-05 NOTE — Progress Notes (Signed)
  Progress Note   Patient: Jenna [name] KDX:[id number] DOB: [birth year] DOA: 12/02/2021     2 DOS: the patient was seen and examined on 12/05/2021   Brief hospital course: 86 year old female with past medical history of asthma, chronic kidney disease, COPD, GERD, osteoporosis, SVT.  She presents with shortness of breath and wheeze.  Admitted with COPD exacerbation and started on steroids.  Chest x-ray negative for pneumonia, neck x-ray negative for epiglottitis.  Viral respiratory panel negative, COVID and influenza also negative.  Patient admitted for COPD exacerbation.  11/15: Patient continued to have wheeze and becoming short of breath with minor exertion, blood glucose elevated at 253.  Increasing the dose of cynically and adding 4 units with meal. Patient is on steroid.  Required 2 L of oxygen    Assessment and Plan: * COPD with acute exacerbation ([place]) Still with bronchospasm.  Soft tissue neck x-ray negative for epiglottitis. -Continue empiric doxycycline.   -Continue Solu-Medrol.   -Continue with supplemental oxygen-wean as tolerated. -Continue with bronchodilators -Increase the frequency of DuoNeb to every 4 hourly  Paroxysmal SVT (supraventricular tachycardia) Continue diltiazem  Uncontrolled type 2 diabetes mellitus with hyperglycemia, without long-term current use of insulin (HCC) Last hemoglobin A1c 7.2 a few months ago.  CBG elevated as patient is on steroid. -Increase the dose of Semglee to 15 units daily -Add 4 units of NovoLog with meals -Continue with SSI  Weakness Physical therapy evaluation appreciated recommending home with home health.  Stage 3a chronic kidney disease ([place]) Renal function at baseline  Bilateral leg edema Continue to monitor.  Obesity (BMI 35.0-39.9 without comorbidity) BMI 39.68   Subjective: Patient continue to have shortness of breath with minor exertion and talking.  Physical Exam: Vitals:   12/05/21 0750 12/05/21 1209  12/05/21 1302 12/05/21 1522  BP: 128/69 (!) 120/43  (!) 122/47  Pulse: 85 85  80  Resp: '18 20  19  '$ Temp: 97.7 F (36.5 C) 97.9 F (36.6 C)  98.3 F (36.8 C)  TempSrc: Oral     SpO2: 97% 97% 97% 98%  Weight:      Height:       General.  Obese elderly lady, in no acute distress. Pulmonary.  Bilateral wheeze with mildly increased work of breathing CV.  Regular rate and rhythm, no JVD, rub or murmur. Abdomen.  Soft, nontender, nondistended, BS positive. CNS.  Alert and oriented .  No focal neurologic deficit. Extremities.  Trace LE edema, no cyanosis, pulses intact and symmetrical. Psychiatry.  Judgment and insight appears normal.    Data Reviewed: Prior data reviewed  Family Communication: Discussed with husband and son at bedside  Disposition: Status is: Inpatient Remains inpatient appropriate because: Still with wheezing and bronchospasm requiring IV Solu-Medrol.  Planned Discharge Destination: Home with Home Health  Time spent: 40 minutes  This record has been created using Systems analyst. Errors have been sought and corrected,but may not always be located. Such creation errors do not reflect on the standard of care.   Author: Lorella Nimrod, MD 12/05/2021 4:38 PM  For on call review [email or web address].

## 2021-12-05 NOTE — Progress Notes (Signed)
Mobility Specialist - Progress Note   12/05/21 1509  Mobility  Activity Ambulated with assistance in hallway  Level of Assistance Standby assist, set-up cues, supervision of patient - no hands on  Assistive Device Front wheel walker  Distance Ambulated (ft) 160 ft  Activity Response Tolerated well  Mobility Referral Yes  $Mobility charge 1 Mobility   Pt sitting in recliner on 2L upon arrival. Pt STS and ambulates in hallway SBA with no LOB. Pt needs two seated rest breaks due to SOB and wheezing. RN notified. Pt wheeled back into room after second rest break. Pt left in recliner with needs in reach and RN in room.   During mobility: HR 95 SpO2 98 (during seated rest break)   [employer]  Mobility Specialist  12/05/21 3:17 PM

## 2021-12-05 NOTE — Inpatient Diabetes Management (Signed)
Inpatient Diabetes Program Recommendations  AACE/ADA: New Consensus Statement on Inpatient Glycemic Control (2015)  Target Ranges:  Prepandial:   less than 140 mg/dL      Peak postprandial:   less than 180 mg/dL (1-2 hours)      Critically ill patients:  140 - 180 mg/dL   Lab Results  Component Value Date   GLUCAP 244 (H) 12/05/2021   HGBA1C 7.2 (A) 07/11/2021    Review of Glycemic Control  Latest Reference Range & Units 12/04/21 07:47 12/04/21 12:03 12/04/21 17:20 12/04/21 21:56 12/05/21 07:52  Glucose-Capillary 70 - 99 mg/dL 280 (H) 229 (H) 199 (H) 264 (H) 244 (H)  (H): Data is abnormally high  Diabetes history: DM2 Outpatient Diabetes medications: None Current orders for Inpatient glycemic control: Semglee 10 units QD, Novolog 0-20 TID and 0-5 QHS, Solumedrol 60 mg QD  Inpatient Diabetes Program Recommendations:    Please consider:  Semglee 10 units BID Obtain a current A1C   Spoke with patient at bedside.  She states she used to have diabetes and was on insulin pen injections back in 2010.  She lost 87lbs and was able to come off all of her diabetes medications.  She does not drink caloric beverages.  Her initial glucose was 316 mg/dL.  She has a new glucometer at home.  Asked her to start checking her blood glucose fasting and 2 hrs after lunch.  She should follow up with her PCP.  Obtaining a current A1c will help with recommendations for discharge.    Will continue to follow while inpatient.  Thank you, Reche Dixon, MSN, [place] Diabetes Coordinator Inpatient Diabetes Program [phone] (team pager from 8a-5p)

## 2021-12-05 NOTE — Progress Notes (Signed)
Mobility Specialist - Progress Note   12/05/21 0949  Mobility  Activity Ambulated with assistance in room  Level of Assistance Standby assist, set-up cues, supervision of patient - no hands on  Assistive Device Front wheel walker  Distance Ambulated (ft) 10 ft  Activity Response Tolerated well  Mobility Referral Yes  $Mobility charge 1 Mobility   Pt OOB in restroom upon arrival. Pt STS and ambulates to EOB SBA. Pt left EOB with needs in reach and family in room.   Gretchen Short  Mobility Specialist  12/05/21 9:50 AM

## 2021-12-06 LAB — GLUCOSE, CAPILLARY
Glucose-Capillary: 211 mg/dL — ABNORMAL HIGH (ref 70–99)
Glucose-Capillary: 107 mg/dL — ABNORMAL HIGH (ref 70–99)
Glucose-Capillary: 148 mg/dL — ABNORMAL HIGH (ref 70–99)
Glucose-Capillary: 133 mg/dL — ABNORMAL HIGH (ref 70–99)

## 2021-12-06 MED ORDER — PREDNISONE 50 MG PO TABS
50.0000 mg | ORAL_TABLET | Freq: Every day | ORAL | Status: DC
  Administered 2021-12-07: 50 mg via ORAL
  Filled 2021-12-06: qty 1

## 2021-12-06 MED ORDER — FUROSEMIDE 10 MG/ML IJ SOLN
40.0000 mg | Freq: Once | INTRAMUSCULAR | Status: AC
  Administered 2021-12-06: 40 mg via INTRAVENOUS
  Filled 2021-12-06: qty 4

## 2021-12-06 MED ORDER — PHENOL 1.4 % MT LIQD
1.0000 | OROMUCOSAL | Status: DC | PRN
  Administered 2021-12-06: 1 via OROMUCOSAL
  Filled 2021-12-06: qty 177

## 2021-12-06 NOTE — Care Management Important Message (Signed)
Important Message  Patient Details  Name: Jenna [name] MRN: [id number] Date of Birth: [birth year]   Medicare Important Message Given:  N/A - LOS <3 / Initial given by admissions     Jenna [name] 12/06/2021, 8:57 AM

## 2021-12-06 NOTE — Progress Notes (Signed)
Physical Therapy Treatment Patient Details Name: Jenna [name] MRN: [id number] DOB: Jun 08, 1935 Today's Date: 12/06/2021   History of Present Illness Jenna [name] is a 86 y.o. female with medical history significant for COPD, non-insulin-dependent type 2 diabetes, chronic lower extremity edema, obesity with history of gastric bypass, HTN, ex-smoker, paroxysmal tachycardia on scheduled and as needed metoprolol, who presents to the ED with shortness of breath, cough and wheezing as well as increase in her chronic lower extremity edema.    PT Comments    Pt is making gradual progress towards goals with ability to ambulate on RA. BERG balance test performed scoring 32 indicating high risk for falls and educated on need to use RW at all times. Pt with SOB symptoms with exertion, however O2 sats WNL. Will continue to progress as able.   SaO2 on room air at rest = 98% SaO2 on room air while ambulating = 100% SaO2 on n/a liters of O2 while ambulating = n/a%   Recommendations for follow up therapy are one component of a multi-disciplinary discharge planning process, led by the attending physician.  Recommendations may be updated based on patient status, additional functional criteria and insurance authorization.  Follow Up Recommendations  Home health PT     Assistance Recommended at Discharge Intermittent Supervision/Assistance  Patient can return home with the following Assistance with cooking/housework;Assist for transportation   Equipment Recommendations  None recommended by PT    Recommendations for Other Services       Precautions / Restrictions Precautions Precautions: Fall Restrictions Weight Bearing Restrictions: No     Mobility  Bed Mobility               General bed mobility comments: NT, received up in chair    Transfers Overall transfer level: Modified independent Equipment used: Rolling walker (2 wheels) Transfers: Sit to/from Stand Sit to Stand:  Modified independent (Device/Increase time)           General transfer comment: pushes from seated surface without cues. Once standing, upright posture    Ambulation/Gait Ambulation/Gait assistance: Supervision Gait Distance (Feet): 50 Feet Assistive device: Rolling walker (2 wheels) Gait Pattern/deviations: Step-through pattern, Decreased step length - right, Decreased step length - left       General Gait Details: ambulated in room, however limited by SOB symptoms. RW used and all mobility performed on RA.   Stairs             Wheelchair Mobility    Modified Rankin (Stroke Patients Only)       Balance Overall balance assessment: Needs assistance Sitting-balance support: Bilateral upper extremity supported, Feet supported Sitting balance-Leahy Scale: Good     Standing balance support: Bilateral upper extremity supported Standing balance-Leahy Scale: Fair                   Standardized Balance Assessment Standardized Balance Assessment : Berg Balance Test Berg Balance Test Sit to Stand: Able to stand using hands after several tries Standing Unsupported: Able to stand 2 minutes with supervision Sitting with Back Unsupported but Feet Supported on Floor or Stool: Able to sit safely and securely 2 minutes Stand to Sit: Sits safely with minimal use of hands Transfers: Able to transfer safely, definite need of hands Standing Unsupported with Eyes Closed: Able to stand 10 seconds safely Standing Ubsupported with Feet Together: Able to place feet together independently and stand 1 minute safely From Standing, Reach Forward with Outstretched Arm: Can reach forward >12 cm safely (5")  From Standing Position, Pick up Object from Floor: Unable to try/needs assist to keep balance From Standing Position, Turn to Look Behind Over each Shoulder: Turn sideways only but maintains balance Turn 360 Degrees: Needs close supervision or verbal cueing Standing Unsupported,  Alternately Place Feet on Step/Stool: Able to complete 4 steps without aid or supervision Standing Unsupported, One Foot in Front: Loses balance while stepping or standing Standing on One Leg: Unable to try or needs assist to prevent fall Total Score: 32        Cognition Arousal/Alertness: Awake/alert Behavior During Therapy: WFL for tasks assessed/performed Overall Cognitive Status: Within Functional Limits for tasks assessed                                          Exercises      General Comments        Pertinent Vitals/Pain Pain Assessment Pain Assessment: No/denies pain    Home Living                          Prior Function            PT Goals (current goals can now be found in the care plan section) Acute Rehab PT Goals Patient Stated Goal: improve breathing, return home PT Goal Formulation: With patient Time For Goal Achievement: 12/18/21 Potential to Achieve Goals: Good Progress towards PT goals: Progressing toward goals    Frequency    Min 2X/week      PT Plan Current plan remains appropriate    Co-evaluation              AM-PAC PT "6 Clicks" Mobility   Outcome Measure  Help needed turning from your back to your side while in a flat bed without using bedrails?: A Little Help needed moving from lying on your back to sitting on the side of a flat bed without using bedrails?: A Little Help needed moving to and from a bed to a chair (including a wheelchair)?: A Little Help needed standing up from a chair using your arms (e.g., wheelchair or bedside chair)?: A Little Help needed to walk in hospital room?: A Little Help needed climbing 3-5 steps with a railing? : A Lot 6 Click Score: 17    End of Session Equipment Utilized During Treatment: Oxygen Activity Tolerance: Patient limited by fatigue Patient left: in chair Nurse Communication: Mobility status PT Visit Diagnosis: Other abnormalities of gait and mobility  (R26.89);Muscle weakness (generalized) (M62.81)     Time: 3154-0086 PT Time Calculation (min) (ACUTE ONLY): 24 min  Charges:  $Gait Training: 8-22 mins $Therapeutic Activity: 8-22 mins                     Greggory Stallion, PT, DPT, GCS [phone]    Jenna [name] 12/06/2021, 1:23 PM

## 2021-12-06 NOTE — TOC Progression Note (Signed)
Transition of Care [facility]) - Progression Note    Patient Details  Name: Jenna [name] MRN: [id number] Date of Birth: 1935/05/31  [facility] CM/SW [place], [place] Phone Number: 12/06/2021, 3:34 PM  Clinical Narrative:      TOC spoke to [facility] and they can accept the patient for HHPT. They will need the HHPT order prior to discharge.      Expected Discharge Plan and Services                              Home health                   Social Determinants of Health (SDOH) Interventions    Readmission Risk Interventions     No data to display

## 2021-12-06 NOTE — Progress Notes (Signed)
  Progress Note   Patient: Jenna [name] MAY:[id number] DOB: 1936-01-06 DOA: 12/02/2021     3 DOS: the patient was seen and examined on 12/06/2021   Brief hospital course: 86 year old female with past medical history of asthma, chronic kidney disease, COPD, GERD, osteoporosis, SVT.  She presents with shortness of breath and wheeze.  Admitted with COPD exacerbation and started on steroids.  Chest x-ray negative for pneumonia, neck x-ray negative for epiglottitis.  Viral respiratory panel negative, COVID and influenza also negative.  Patient admitted for COPD exacerbation.  11/15: Patient continued to have wheeze and becoming short of breath with minor exertion, blood glucose elevated at 253.  Increasing the dose of cynically and adding 4 units with meal. Patient is on steroid.  Required 2 L of oxygen  11/16: Patient continued to have wheeze but able to wean off to room air while working with PT.  Still feeling short of breath with minor exertion but maintaining saturation. Most likely will get benefit with little prolonged tapering course of steroid. PT is recommending home health which is being ordered. Also had little worsening of lower extremity edema-giving 1 dose of 40 mg IV Lasix  Assessment and Plan: * COPD with acute exacerbation ([place]) Still with bronchospasm.  Soft tissue neck x-ray negative for epiglottitis. -Continue empiric doxycycline.   -Switch Solu-Medrol with prednisone -Continue with supplemental oxygen-wean as tolerated. -Continue with bronchodilators -Increase the frequency of DuoNeb to every 4 hourly  Paroxysmal SVT (supraventricular tachycardia) Continue diltiazem  Uncontrolled type 2 diabetes mellitus with hyperglycemia, without long-term current use of insulin (HCC) Last hemoglobin A1c 7.2 a few months ago.  CBG elevated as patient is on steroid. -Increase the dose of Semglee to 15 units daily -Add 4 units of NovoLog with meals -Continue with  SSI  Weakness Physical therapy evaluation appreciated recommending home with home health.  Stage 3a chronic kidney disease ([place]) Renal function at baseline  Bilateral leg edema Continue to monitor.  Obesity (BMI 35.0-39.9 without comorbidity) BMI 39.68   Subjective: Patient was sitting in chair when seen today.  Continued to have shortness of breath with minor exertion but able to work some with PT  Physical Exam: Vitals:   12/06/21 1144 12/06/21 1203 12/06/21 1555 12/06/21 1616  BP:  (!) 121/50  (!) 145/69  Pulse:  71  66  Resp:  20  17  Temp:  98 F (36.7 C)  97.6 F (36.4 C)  TempSrc:  Oral    SpO2: 94% 99% 97% 100%  Weight:      Height:       General.  Obese lady, in no acute distress. Pulmonary.  Scattered wheeze bilaterally, normal respiratory effort. CV.  Regular rate and rhythm, no JVD, rub or murmur. Abdomen.  Soft, nontender, nondistended, BS positive. CNS.  Alert and oriented .  No focal neurologic deficit. Extremities.  2 + LE edema, no cyanosis, pulses intact and symmetrical. Psychiatry.  Judgment and insight appears normal.    Data Reviewed: Prior data reviewed  Family Communication:   Disposition: Status is: Inpatient Remains inpatient appropriate because: Still with wheezing and bronchospasm requiring IV Solu-Medrol.  Planned Discharge Destination: Home with Home Health  Time spent: 42 minutes  This record has been created using Systems analyst. Errors have been sought and corrected,but may not always be located. Such creation errors do not reflect on the standard of care.   Author: Lorella Nimrod, MD 12/06/2021 4:59 PM  For on call review [email or web address].

## 2021-12-06 NOTE — Progress Notes (Signed)
Mobility Specialist - Progress Note   12/06/21 1401  Mobility  Activity Ambulated with assistance in hallway  Level of Assistance Standby assist, set-up cues, supervision of patient - no hands on  Assistive Device Front wheel walker  Distance Ambulated (ft) 60 ft  Activity Response Tolerated well  Mobility Referral Yes  $Mobility charge 1 Mobility   Pt OOB on 2L upon arrival. Pt STS and ambulates in hallway Supervision with no LOB. Pt is left in recliner with needs in reach and husband in room.   Gretchen Short  Mobility Specialist  12/06/21 2:03 PM

## 2021-12-07 LAB — BASIC METABOLIC PANEL
Anion gap: 9 (ref 5–15)
BUN: 40 mg/dL — ABNORMAL HIGH (ref 8–23)
CO2: 29 mmol/L (ref 22–32)
Calcium: 8.9 mg/dL (ref 8.9–10.3)
Chloride: 101 mmol/L (ref 98–111)
Creatinine, Ser: 1.29 mg/dL — ABNORMAL HIGH (ref 0.44–1.00)
GFR, Estimated: 41 mL/min — ABNORMAL LOW (ref >60)
Glucose, Bld: 99 mg/dL (ref 70–99)
Potassium: 4.4 mmol/L (ref 3.5–5.1)
Sodium: 139 mmol/L (ref 135–145)

## 2021-12-07 LAB — GLUCOSE, CAPILLARY
Glucose-Capillary: 184 mg/dL — ABNORMAL HIGH (ref 70–99)
Glucose-Capillary: 91 mg/dL (ref 70–99)

## 2021-12-07 MED ORDER — PREDNISONE 10 MG (21) PO TBPK: ORAL_TABLET | ORAL | 0 refills | Status: DC

## 2021-12-07 NOTE — Discharge Summary (Signed)
Physician Discharge Summary   Patient: Jenna [name] MRN: [id number] DOB: 1936/01/21  Admit date:     12/02/2021  Discharge date: 12/07/21  Discharge Physician: Lorella Nimrod   PCP: Olin Hauser, DO   Recommendations at discharge:  Please obtain CBC and BMP in 1 week Follow-up with primary care provider within a week Follow-up with pulmonologist  Discharge Diagnoses: Principal Problem:   COPD with acute exacerbation [facility]) Active Problems:   Paroxysmal SVT (supraventricular tachycardia)   Uncontrolled type 2 diabetes mellitus with hyperglycemia, without long-term current use of insulin (HCC)   Obesity (BMI 35.0-39.9 without comorbidity)   Bilateral leg edema   Stage 3a chronic kidney disease ([place])   Weakness   Hospital Course: 86 year old female with past medical history of asthma, chronic kidney disease, COPD, GERD, osteoporosis, SVT.  She presents with shortness of breath and wheeze.  Admitted with COPD exacerbation and started on steroids.  Chest x-ray negative for pneumonia, neck x-ray negative for epiglottitis.  Viral respiratory panel negative, COVID and influenza also negative.  Patient admitted for COPD exacerbation.  11/15: Patient continued to have wheeze and becoming short of breath with minor exertion, blood glucose elevated at 253.  Increasing the dose of cynically and adding 4 units with meal. Patient is on steroid.  Required 2 L of oxygen  11/16: Patient continued to have wheeze but able to wean off to room air while working with PT.  Still feeling short of breath with minor exertion but maintaining saturation. Most likely will get benefit with little prolonged tapering course of steroid. PT is recommending home health which is being ordered. Also had little worsening of lower extremity edema-giving 1 dose of 40 mg IV Lasix.  11/17: Patient was feeling little improved when seen today.  Wheezing much improved but continued to have mild scattered  wheeze.  She continued to desaturate with ambulation requiring 2 to 3 L of oxygen.  Patient would like to go home so she is being discharged on a tapering dose of steroid and 5 more days of doxycycline. She is also being given home oxygen to be used all the time to keep the saturation above 90%.  Slight increase in creatinine after getting IV Lasix yesterday, no more IV Lasix today and she will resume her home Lasix from tomorrow.  Patient will continue with current medications and need to have a close follow-up with her providers for further recommendations.  Assessment and Plan: * COPD with acute exacerbation ([place]) Still with bronchospasm.  Soft tissue neck x-ray negative for epiglottitis. -Continue empiric doxycycline.   -Switch Solu-Medrol with prednisone -Continue with supplemental oxygen-wean as tolerated. -Continue with bronchodilators -Increase the frequency of DuoNeb to every 4 hourly  Paroxysmal SVT (supraventricular tachycardia) Continue diltiazem  Uncontrolled type 2 diabetes mellitus with hyperglycemia, without long-term current use of insulin (HCC) Last hemoglobin A1c 7.2 a few months ago.  CBG elevated as patient is on steroid. -Increase the dose of Semglee to 15 units daily -Add 4 units of NovoLog with meals -Continue with SSI  Weakness Physical therapy evaluation appreciated recommending home with home health.  Stage 3a chronic kidney disease ([place]) Renal function at baseline  Bilateral leg edema Continue to monitor.  Obesity (BMI 35.0-39.9 without comorbidity) BMI 39.68   Consultants: None Procedures performed: None Disposition: Home health Diet recommendation:  Discharge Diet Orders (From admission, onward)     Start     Ordered   12/07/21 0000  Diet - low sodium heart healthy  12/07/21 1222           Cardiac and Carb modified diet DISCHARGE MEDICATION: Allergies as of 12/07/2021       Reactions   Azithromycin Other (See Comments)    Causes stomach burning pains   Hydromorphone Other (See Comments)   confusion, personality change   Morphine Other (See Comments)   Goes crazy   Nsaids Other (See Comments)   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   albuterol (2.5 MG/3ML) 0.083% nebulizer solution Commonly known as: PROVENTIL Take 3 mLs (2.5 mg total) by nebulization every 6 (six) hours as needed for wheezing or shortness of breath.   alendronate 70 MG tablet Commonly known as: FOSAMAX Take 1 tablet (70 mg total) by mouth once a week. Take with a full glass of water on an empty stomach.   busPIRone 5 MG tablet Commonly known as: BUSPAR Take 1 tablet (5 mg total) by mouth 3 (three) times daily as needed (anxiety).   Cholecalciferol 25 MCG (1000 UT) tablet Take 1,000 Units by mouth daily.   diltiazem 180 MG 24 hr capsule Commonly known as: CARDIZEM CD Take 1 capsule (180 mg total) by mouth daily.   doxycycline 100 MG capsule Commonly known as: VIBRAMYCIN Take 1 capsule (100 mg total) by mouth 2 (two) times daily for 10 days.   famotidine 20 MG tablet Commonly known as: PEPCID Take 20 mg by mouth 2 (two) times daily.   ferrous sulfate 325 (65 FE) MG EC tablet Take 325 mg by mouth at bedtime.   furosemide 20 MG tablet Commonly known as: LASIX Take 1 tablet (20 mg total) by mouth daily as needed for fluid or edema. Take lasix daily, with extra dose as needed for swelling   gabapentin 300 MG capsule Commonly known  as: NEURONTIN Take 1 capsule (300 mg total) by mouth at bedtime.   guaiFENesin 600 MG 12 hr tablet Commonly known as: Mucinex Take 1 tablet (600 mg total) by mouth 2 (two) times daily for 15 days.   ipratropium-albuterol 0.5-2.5 (3) MG/3ML Soln Commonly known as: DUONEB Take 3 mLs by nebulization every 4 (four) hours as needed.   methocarbamol 500 MG tablet Commonly known as: Robaxin Take 1 tablet (500 mg total) by mouth every 8 (eight) hours as needed for muscle spasms.   One-A-Day Womens Petites Tabs Take 2 tablets by mouth every morning.   pantoprazole 40 MG tablet Commonly known as: PROTONIX Take 1 tablet (40 mg total) by mouth daily.   predniSONE 10 MG (21) Tbpk tablet Commonly known as: STERAPRED UNI-PAK 21 TAB Take 4 tablets daily for next 2 days, decrease 1 tablet every other day, until you finish your packet   sertraline 100 MG tablet Commonly known as: ZOLOFT Take 1 tablet (100 mg total) by mouth daily.   simvastatin 40 MG tablet Commonly known as: ZOCOR Take 1 tablet (40 mg total) by mouth daily.   SUPER B COMPLEX/C PO Take  1 tablet by mouth daily.   traZODone 100 MG tablet Commonly known as: DESYREL Take 1 tablet (100 mg total) by mouth at bedtime.   Trelegy Ellipta 100-62.5-25 MCG/ACT Aepb Generic drug: Fluticasone-Umeclidin-Vilant Inhale 1 puff into the lungs daily.   vitamin C 1000 MG tablet Take 500 mg by mouth daily.               Durable Medical Equipment  (From admission, onward)           Start     Ordered   12/07/21 1130  For home use only DME oxygen  Once       Question Answer Comment  Length of Need Lifetime   Mode or (Route) Nasal cannula   Liters per Minute 2   Frequency Continuous (stationary and portable oxygen unit needed)   Oxygen conserving device Yes   Oxygen delivery system Gas      12/07/21 1129            Follow-up Information     Olin Hauser, DO .   Specialty: Family Medicine Contact  information: [address] [place] [address] [phone]         [place]. Schedule an appointment as soon as possible for a visit .   Specialty: Cardiology Contact information: [address] [place]        [place] EMERGENCY DEPARTMENT .   Specialty: Emergency Medicine Why: If symptoms worsen Contact information: [place] [id number] [place] [phone]               Discharge Exam: Danley Danker Weights   12/02/21 1945 12/02/21 2003  Weight: 95.3 kg 95.3 kg   General.     In no acute distress. Pulmonary.  Mild scattered wheeze bilaterally, normal respiratory effort. CV.  Regular rate and rhythm, no JVD, rub or murmur. Abdomen.  Soft, nontender, nondistended, BS positive. CNS.  Alert and oriented .  No focal neurologic deficit. Extremities.  No edema, no cyanosis, pulses intact and symmetrical. Psychiatry.  Judgment and insight appears normal.   Condition at discharge: stable  The results of significant diagnostics from this hospitalization (including imaging, microbiology, ancillary and laboratory) are listed below for reference.   Imaging Studies: DG Neck Soft Tissue  Result Date: 12/03/2021 CLINICAL DATA:  Wheezing EXAM: NECK SOFT TISSUES - 1+ VIEW COMPARISON:  CT neck done on 04/26/2020 FINDINGS: There is no narrowing of airways. Epiglottis is not thickened. There are no opaque foreign bodies. Degenerative changes are noted in cervical spine with anterior bony spurs, more severe at C5-C6 level. IMPRESSION: There is no narrowing of airways. Epiglottis appears to be of normal size. Cervical spondylosis. Electronically Signed   By: Elmer Picker M.D.   On: 12/03/2021 11:07   DG Chest 2 View  Result Date: 12/02/2021 CLINICAL DATA:  Shortness of breath and cough. EXAM: CHEST - 2 VIEW COMPARISON:   November 01, 2021 FINDINGS: The heart size and mediastinal contours are within normal limits. There is mild, stable elevation of the right hemidiaphragm. Both lungs are clear. Radiopaque surgical clips are seen within the right upper quadrant. Multilevel degenerative changes are noted throughout the thoracic spine. IMPRESSION: No active cardiopulmonary disease. Electronically Signed   By: Virgina Norfolk M.D.   On: 12/02/2021 20:36    Microbiology: Results for orders placed or performed during the hospital encounter of 12/02/21  Resp Panel by RT-PCR (Flu A&B, Covid) Anterior Nasal Swab     Status: None   Collection Time: 12/02/21  7:51 PM   Specimen: Anterior Nasal Swab  Result Value Ref Range Status   SARS Coronavirus 2 by RT PCR NEGATIVE NEGATIVE Final    Comment: (NOTE) SARS-CoV-2 target nucleic acids are NOT DETECTED.  The SARS-CoV-2 RNA is generally detectable in upper respiratory specimens during the acute phase of infection. The lowest concentration of SARS-CoV-2 viral copies this assay can detect is 138 copies/mL. A negative result does not preclude SARS-Cov-2 infection and should not be used as the sole basis for treatment or other patient management decisions. A negative result may occur with  improper specimen collection/handling, submission of specimen other than nasopharyngeal swab, presence of viral mutation(s) within the areas targeted by this assay, and inadequate number of viral copies(<138 copies/mL). A negative result must be combined with clinical observations, patient history, and epidemiological information. The expected result is Negative.  Fact Sheet for Patients:  EntrepreneurPulse.com.au  Fact Sheet for Healthcare Providers:  IncredibleEmployment.be  This test is no t yet approved or cleared by the [place] FDA and  has been authorized for detection and/or diagnosis of SARS-CoV-2 by FDA under an Emergency Use  Authorization (EUA). This EUA will remain  in effect (meaning this test can be used) for the duration of the COVID-19 declaration under Section 564(b)(1) of the Act, 21 U.S.C.section 360bbb-3(b)(1), unless the authorization is terminated  or revoked sooner.       Influenza A by PCR NEGATIVE NEGATIVE Final   Influenza B by PCR NEGATIVE NEGATIVE Final    Comment: (NOTE) The Xpert Xpress SARS-CoV-2/FLU/RSV plus assay is intended as an aid in the diagnosis of influenza from Nasopharyngeal swab specimens and should not be used as a sole basis for treatment. Nasal washings and aspirates are unacceptable for Xpert Xpress SARS-CoV-2/FLU/RSV testing.  Fact Sheet for Patients: EntrepreneurPulse.com.au  Fact Sheet for Healthcare Providers: IncredibleEmployment.be  This test is not yet approved or cleared by the [place] FDA and has been authorized for detection and/or diagnosis of SARS-CoV-2 by FDA under an Emergency Use Authorization (EUA). This EUA will remain in effect (meaning this test can be used) for the duration of the COVID-19 declaration under Section 564(b)(1) of the Act, 21 U.S.C. section 360bbb-3(b)(1), unless the authorization is terminated or revoked.  Performed at [facility], [place],  [address]   Respiratory (~20 pathogens) panel by PCR     Status: None   Collection Time: 12/03/21 11:21 AM   Specimen: Nasopharyngeal Swab; Respiratory  Result Value Ref Range Status   Adenovirus NOT DETECTED NOT DETECTED Final   Coronavirus 229E NOT DETECTED NOT DETECTED Final    Comment: (NOTE) The Coronavirus on the Respiratory Panel, DOES NOT test for the novel  Coronavirus (2019 nCoV)    Coronavirus HKU1 NOT DETECTED NOT DETECTED Final   Coronavirus NL63 NOT DETECTED NOT DETECTED Final   Coronavirus OC43 NOT DETECTED NOT DETECTED Final   Metapneumovirus NOT DETECTED NOT DETECTED Final   Rhinovirus /  Enterovirus NOT DETECTED NOT DETECTED Final   Influenza A NOT DETECTED NOT DETECTED Final   Influenza B NOT DETECTED NOT DETECTED Final   Parainfluenza Virus 1 NOT DETECTED NOT DETECTED Final   Parainfluenza Virus 2 NOT DETECTED NOT DETECTED Final   Parainfluenza Virus 3 NOT DETECTED NOT DETECTED Final   Parainfluenza Virus 4 NOT DETECTED NOT DETECTED Final   Respiratory Syncytial Virus  NOT DETECTED NOT DETECTED Final   Bordetella pertussis NOT DETECTED NOT DETECTED Final   Bordetella Parapertussis NOT DETECTED NOT DETECTED Final   Chlamydophila pneumoniae NOT DETECTED NOT DETECTED Final   Mycoplasma pneumoniae NOT DETECTED NOT DETECTED Final    Comment: Performed at [facility], [place] [address], [place], [place] [address]    Labs: CBC: Recent Labs  Lab 12/02/21 1946 12/03/21 0354 12/05/21 0442  WBC 7.0 7.1 10.4  HGB 12.6 11.6* 11.4*  HCT 38.1 34.3* 33.6*  MCV 97.2 95.0 94.6  PLT 252 213 709   Basic Metabolic Panel: Recent Labs  Lab 12/02/21 1946 12/03/21 0354 12/05/21 0442 12/07/21 1127  NA 142  --  140 139  K 3.8  --  3.8 4.4  CL 103  --  104 101  CO2 27  --  29 29  GLUCOSE 316*  --  253* 99  BUN 15  --  31* 40*  CREATININE 1.18* 1.14* 1.13* 1.29*  CALCIUM 8.9  --  8.5* 8.9   Liver Function Tests: No results for input(s): "AST", "ALT", "ALKPHOS", "BILITOT", "PROT", "ALBUMIN" in the last 168 hours. CBG: Recent Labs  Lab 12/06/21 1204 12/06/21 1619 12/06/21 1935 12/07/21 0721 12/07/21 1148  GLUCAP 107* 148* 133* 184* 91    Discharge time spent: greater than 30 minutes.  This record has been created using Systems analyst. Errors have been sought and corrected,but may not always be located. Such creation errors do not reflect on the standard of care.   Signed: Lorella Nimrod, MD [facility] 12/07/2021

## 2021-12-07 NOTE — TOC Progression Note (Addendum)
Transition of Care [facility]) - Progression Note    Patient Details  Name: Jenna [name] MRN: [id number] Date of Birth: May 21, 1935  Transition of Care [facility]) CM/SW [place], [place] Phone Number: 12/07/2021, 10:08 AM  Clinical Narrative:      TOC reached out to [facility]. The patient will need HH near [facility]. The liaison is calling the [place] branch. Patient will need home health orders.   TOC spoke to [place] at [employer]. [facility] can see the patient in ten days for HHPT.  TOC is waiting to hear back from [employer] about the home oxygen.    Expected Discharge Plan and Services    HH and Home Oxygen                                             Social Determinants of Health (SDOH) Interventions    Readmission Risk Interventions     No data to display

## 2021-12-07 NOTE — TOC Transition Note (Addendum)
Transition of Care [facility]) - CM/SW Discharge Note   Patient Details  Name: Jenna [name] MRN: [id number] Date of Birth: 07-13-1935  Transition of Care [facility]) CM/SW Contact:  Colen Darling, [place] Phone Number: 12/07/2021, 3:04 PM   Clinical Narrative:      TOC has arranged [facility]. TOC is contacting the DME agency regarding the oxygen.  TOC spoke to RN and the patient will have [employer] home DME arranged.       Patient Goals and CMS Choice      [facility] and [facility]  Discharge Placement                 Home with Home Health      Discharge Plan and [place] Determinants of Health (SDOH) Interventions     Readmission Risk Interventions     No data to display

## 2021-12-07 NOTE — Progress Notes (Signed)
PT Cancellation Note  Patient Details Name: Jenna [name] MRN: [id number] DOB: 02-21-1935   Cancelled Treatment:    Reason Eval/Treat Not Completed: Other (comment). Pt received upright in bed upon entry. Per pt, planning to d/c home today. Reports being up with mobility specialist this morning. No PT needs this time with pt stating she has no questions or concerns about d/c'ing home.    Salem Caster. Fairly IV, PT, DPT Physical Therapist- [facility]  12/07/2021, 12:43 PM

## 2021-12-07 NOTE — Progress Notes (Signed)
Mobility Specialist - Progress Note   Pre-mobility:  SpO2 (95) During mobility: SpO2 (94) Post-mobility: SPO2 (95)     12/07/21 0900  Mobility  Activity Stood at bedside;Ambulated with assistance in room  Level of Assistance Standby assist, set-up cues, supervision of patient - no hands on  Assistive Device Front wheel walker  Distance Ambulated (ft) 80 ft  Activity Response Tolerated well  Mobility Referral Yes  $Mobility charge 1 Mobility   Pt resting in recliner on 2L upon entry. Pt STS and ambulates in hallway SBA. Pt given VC to step closer to RW. Pt SpO2 level remained relatively the same throughout mobility but, Pt had heavy wheezing. Rn notified. Pt returned to recliner and left with needs in reach.   Loma Sender Mobility Specialist 12/07/21, 9:12 AM

## 2021-12-07 NOTE — Care Management Important Message (Signed)
Important Message  Patient Details  Name: Jenna [name] MRN: [id number] Date of Birth: 08-14-1935   Medicare Important Message Given:  Yes     [name] 12/07/2021, 12:37 PM

## 2021-12-07 NOTE — Progress Notes (Signed)
SATURATION QUALIFICATIONS: (This note is used to comply with regulatory documentation for home oxygen)  Patient Saturations on 2 liters oxygen at Rest = 98%  Patient Saturations on Room Air while Ambulating = 65%  Patient Saturations on 2 Liters of oxygen while Ambulating = 77%  Please briefly explain why patient needs home oxygen: Pt requires oxygen to maintain oxygen saturations at rest and with activity.

## 2021-12-08 DIAGNOSIS — J441 Chronic obstructive pulmonary disease with (acute) exacerbation: Secondary | ICD-10-CM | POA: Diagnosis not present

## 2021-12-10 ENCOUNTER — Ambulatory Visit: Payer: Self-pay | Admitting: *Deleted

## 2021-12-10 ENCOUNTER — Telehealth: Payer: Self-pay

## 2021-12-10 NOTE — Telephone Encounter (Signed)
Transition Care Management Follow-up Telephone Call Date of discharge and from where: [facility] 12/07/2021 How have you been since you were released from the hospital? weak Any questions or concerns? No  Items Reviewed: Did the pt receive and understand the discharge instructions provided? Yes  Medications obtained and verified? Yes  Other? No  Any new allergies since your discharge? No  Dietary orders reviewed? Yes Do you have support at home? Yes   Home Care and Equipment/Supplies: Were home health services ordered? no If so, what is the name of the agency? N/a  Has the agency set up a time to come to the patient's home? not applicable Were any new equipment or medical supplies ordered?  No What is the name of the medical supply agency? N/a Were you able to get the supplies/equipment? not applicable Do you have any questions related to the use of the equipment or supplies? No  Functional Questionnaire: (I = Independent and D = Dependent) ADLs: I  Bathing/Dressing- D  Meal Prep- D  Eating- I  Maintaining continence- D  Transferring/Ambulation- I  Managing Meds- I  Follow up appointments reviewed:  PCP Hospital f/u appt confirmed? Yes  Scheduled to see Dr Parks Ranger on 12/11/2021@ 2:00. [facility] f/u appt confirmed? Yes  Scheduled to see Dr Raul Del on 12/18/2021 @ 1:00. Are transportation arrangements needed? No  If their condition worsens, is the pt aware to call PCP or go to the Emergency Dept.? Yes Was the patient provided with contact information for the PCP's office or ED? Yes Was to pt encouraged to call back with questions or concerns? Yes  .lh

## 2021-12-10 NOTE — Telephone Encounter (Signed)
  Chief Complaint: medication assistance  Symptoms: NA Frequency:  Pertinent Negatives: NA Disposition: '[]'$ ED /'[]'$ Urgent Care (no appt availability in office) / '[x]'$ Appointment(In office/virtual)/ '[]'$  [place] Virtual [facility]/ '[]'$ Home Care/ '[]'$ Refused Recommended Disposition /'[]'$ [facility] [facility]/ '[]'$  Follow-up with PCP Additional Notes: pt was dc'd from hospital on 12/07/21. Pt states they were giving her insulin there d/t BS being high. Pt asking if she needed to take anything since being home. Pt has FU appt on 12/11/21 with Dr. Raliegh Ip. Advised pt to wait for appt tomorrow since she doesn't have orders for insulin and hasn't been monitoring BS since she got new glucometer. Pt verbalized understanding.  Summary: discuss medication   Pt states she was told to restart taking insulin when discharged from the hospital on 11-17  I was unable to locate medication on medication list  Please fu w/ pt         Reason for Disposition  [1] Caller has NON-URGENT medicine question about med that PCP prescribed AND [2] triager unable to answer question  Answer Assessment - Initial Assessment Questions 1. NAME of MEDICINE: "What medicine(s) are you calling about?"     insulin 2. QUESTION: "What is your question?" (e.g., double dose of medicine, side effect)     Wanting to know if needed to continue taking at home  3. PRESCRIBER: "Who prescribed the medicine?" Reason: if prescribed by specialist, call should be referred to that group.  Protocols used: Medication Question Call-A-AH

## 2021-12-10 NOTE — Telephone Encounter (Signed)
Message from [place] sent at 12/10/2021  9:22 AM EST  Summary: discuss medication   Pt states she was told to restart taking insulin when discharged from the hospital on 11-17  I was unable to locate medication on medication list  Please fu w/ pt          Call History   Type Contact Phone/Fax User  12/10/2021 09:21 AM EST Phone (Incoming) Mene[place], Joell Buerger [address] [place]" (Self) [phone] (H) [place], [place]

## 2021-12-10 NOTE — Telephone Encounter (Signed)
Attempted to return her call.   Left a voicemail to call back. 

## 2021-12-11 ENCOUNTER — Ambulatory Visit (INDEPENDENT_AMBULATORY_CARE_PROVIDER_SITE_OTHER): Payer: Medicare Other | Admitting: Family Medicine

## 2021-12-11 ENCOUNTER — Encounter: Payer: Self-pay | Admitting: Family Medicine

## 2021-12-11 VITALS — BP 151/51 | HR 75 | Ht 61.0 in | Wt 210.0 lb

## 2021-12-11 DIAGNOSIS — J441 Chronic obstructive pulmonary disease with (acute) exacerbation: Secondary | ICD-10-CM

## 2021-12-11 DIAGNOSIS — E1121 Type 2 diabetes mellitus with diabetic nephropathy: Secondary | ICD-10-CM | POA: Diagnosis not present

## 2021-12-11 DIAGNOSIS — J432 Centrilobular emphysema: Secondary | ICD-10-CM | POA: Diagnosis not present

## 2021-12-11 MED ORDER — ALBUTEROL SULFATE HFA 108 (90 BASE) MCG/ACT IN AERS: 2.0000 | INHALATION_SPRAY | Freq: Four times a day (QID) | RESPIRATORY_TRACT | 3 refills | Status: DC | PRN

## 2021-12-11 NOTE — Patient Instructions (Addendum)
Thank you for coming to the office today.  Finish current medications from hospital Prednisone, Doxycycline course. Continue home oxygen 2L continuous No further home health ordered, let me know if you change your mind Prednisone is raising your blood sugar. It will improve when off Prednisone  Please schedule a Follow-up Appointment to: Return if symptoms worsen or fail to improve.  If you have any other questions or concerns, please feel free to call the office or send a message through [place]. You may also schedule an earlier appointment if necessary.  Additionally, you may be receiving a survey about your experience at our office within a few days to 1 week by e-mail or mail. We value your feedback.  Nobie Putnam, DO [place]

## 2021-12-11 NOTE — Progress Notes (Signed)
Subjective:    Patient ID: Jenna [name], female    DOB: 06-16-1935, 86 y.o.   MRN: [id number]  Jenna [name] is a 86 y.o. female presenting on 12/11/2021 for Hospitalization Follow-up and COPD   HPI  HOSPITAL FOLLOW-UP VISIT  Hospital/Location: [place] Date of Admission: 12/02/21 Date of Discharge: 12/07/21 Transitions of care telephone call: Completed 12/10/21 Juanda Crumble LPN  Reason for Admission: AECOPD  - Hospital H&P and Discharge Summary have been reviewed - Patient presents today 4 days after recent hospitalization. Brief summary of recent course, patient had symptoms of COPD exacerbation, dyspnea, productive cough, hospitalized, treated with oxygen treatment, IV steroids, antibiotic course, discharged on oral prednisone taper and doxycycline 5 more days, using home oxygen continuously, 2L.  No HH Physical therapy orders done, she declines at this time.  Elevated Glucose in hospital. On Prednisone course raising sugar. She is not on medication and prefers to avoid it, her A1c had been 6-7 range previously.  Lower ext edema, now improved on diuretic regimen. Has upcoming Cardiologist apt.  - Today reports overall has done well after discharge. Symptoms of dyspnea improved but still present with activity.  - New medications on discharge: Prednisone, Doxycycline - Changes to current meds on discharge: none  I have reviewed the discharge medication list, and have reconciled the current and discharge medications today.   Current Outpatient Medications:    albuterol (PROVENTIL) (2.5 MG/3ML) 0.083% nebulizer solution, Take 3 mLs (2.5 mg total) by nebulization every 6 (six) hours as needed for wheezing or shortness of breath., Disp: 360 mL, Rfl: 0   alendronate (FOSAMAX) 70 MG tablet, Take 1 tablet (70 mg total) by mouth once a week. Take with a full glass of water on an empty stomach., Disp: 12 tablet, Rfl: 3   Ascorbic Acid (VITAMIN C) 1000 MG tablet, Take 500 mg by  mouth daily. , Disp: , Rfl:    busPIRone (BUSPAR) 5 MG tablet, Take 1 tablet (5 mg total) by mouth 3 (three) times daily as needed (anxiety)., Disp: 90 tablet, Rfl: 1   Cholecalciferol 25 MCG (1000 UT) tablet, Take 1,000 Units by mouth daily. , Disp: , Rfl:    diltiazem (CARDIZEM CD) 180 MG 24 hr capsule, Take 1 capsule (180 mg total) by mouth daily., Disp: 90 capsule, Rfl: 0   doxycycline (VIBRAMYCIN) 100 MG capsule, Take 1 capsule (100 mg total) by mouth 2 (two) times daily for 10 days., Disp: 20 capsule, Rfl: 0   famotidine (PEPCID) 20 MG tablet, Take 20 mg by mouth 2 (two) times daily., Disp: , Rfl:    ferrous sulfate 325 (65 FE) MG EC tablet, Take 325 mg by mouth at bedtime. , Disp: , Rfl:    furosemide (LASIX) 20 MG tablet, Take 1 tablet (20 mg total) by mouth daily as needed for fluid or edema. Take lasix daily, with extra dose as needed for swelling, Disp: 90 tablet, Rfl: 3   gabapentin (NEURONTIN) 300 MG capsule, Take 1 capsule (300 mg total) by mouth at bedtime., Disp: 90 capsule, Rfl: 3   guaiFENesin (MUCINEX) 600 MG 12 hr tablet, Take 1 tablet (600 mg total) by mouth 2 (two) times daily for 15 days., Disp: 30 tablet, Rfl: 0   ipratropium-albuterol (DUONEB) 0.5-2.5 (3) MG/3ML SOLN, Take 3 mLs by nebulization every 4 (four) hours as needed., Disp: 720 mL, Rfl: 5   methocarbamol (ROBAXIN) 500 MG tablet, Take 1 tablet (500 mg total) by mouth every 8 (eight) hours as needed  for muscle spasms., Disp: 90 tablet, Rfl: 3   Multiple Vitamins-Minerals (ONE-A-DAY WOMENS PETITES) TABS, Take 2 tablets by mouth every morning., Disp: , Rfl:    pantoprazole (PROTONIX) 40 MG tablet, Take 1 tablet (40 mg total) by mouth daily., Disp: 30 tablet, Rfl: 3   predniSONE (STERAPRED UNI-PAK 21 TAB) 10 MG (21) TBPK tablet, Take 4 tablets daily for next 2 days, decrease 1 tablet every other day, until you finish your packet, Disp: 21 tablet, Rfl: 0   sertraline (ZOLOFT) 100 MG tablet, Take 1 tablet (100 mg total) by  mouth daily., Disp: 90 tablet, Rfl: 3   simvastatin (ZOCOR) 40 MG tablet, Take 1 tablet (40 mg total) by mouth daily., Disp: 90 tablet, Rfl: 3   SUPER B COMPLEX/C PO, Take 1 tablet by mouth daily., Disp: , Rfl:    traZODone (DESYREL) 100 MG tablet, Take 1 tablet (100 mg total) by mouth at bedtime., Disp: 90 tablet, Rfl: 3   TRELEGY ELLIPTA 100-62.5-25 MCG/INH AEPB, Inhale 1 puff into the lungs daily., Disp: , Rfl:    albuterol (VENTOLIN HFA) 108 (90 Base) MCG/ACT inhaler, Inhale 2 puffs into the lungs every 6 (six) hours as needed for wheezing or shortness of breath., Disp: 1 each, Rfl: 3  ------------------------------------------------------------------------- Social History   Tobacco Use   Smoking status: Former    Packs/day: 1.00    Years: 20.00    Total pack years: 20.00    Types: Cigarettes    Quit date: 07/1981    Years since quitting: 40.4   Smokeless tobacco: Former  Scientific laboratory technician Use: Never used  Substance Use Topics   Alcohol use: Not Currently    Comment: past   Drug use: No    Review of Systems Per HPI unless specifically indicated above     Objective:    BP (!) 151/51   Pulse 75   Ht '5\' 1"'$  (1.549 m)   Wt 210 lb (95.3 kg)   SpO2 98%   BMI 39.68 kg/m   Wt Readings from Last 3 Encounters:  12/11/21 210 lb (95.3 kg)  12/02/21 210 lb (95.3 kg)  11/09/21 212 lb (96.2 kg)    Physical Exam Vitals and nursing note reviewed.  Constitutional:      General: She is not in acute distress.    Appearance: She is well-developed. She is obese. She is not diaphoretic.     Comments: Well-appearing, comfortable, cooperative  HENT:     Head: Normocephalic and atraumatic.  Eyes:     General:        Right eye: No discharge.        Left eye: No discharge.     Conjunctiva/sclera: Conjunctivae normal.  Neck:     Thyroid: No thyromegaly.  Cardiovascular:     Rate and Rhythm: Normal rate and regular rhythm.     Heart sounds: Normal heart sounds. No murmur  heard. Pulmonary:     Effort: Pulmonary effort is normal. No respiratory distress.     Breath sounds: Wheezing and rhonchi present. No rales.     Comments: Some reduced air movement. Musculoskeletal:        General: Normal range of motion.     Cervical back: Normal range of motion and neck supple.     Right lower leg: Edema present.     Left lower leg: Edema present.  Lymphadenopathy:     Cervical: No cervical adenopathy.  Skin:    General: Skin is warm and  dry.     Findings: No erythema or rash.  Neurological:     Mental Status: She is alert and oriented to person, place, and time.  Psychiatric:        Behavior: Behavior normal.     Comments: Well groomed, good eye contact, normal speech and thoughts       Results for orders placed or performed during the hospital encounter of 12/02/21  Resp Panel by RT-PCR (Flu A&B, Covid) Anterior Nasal Swab   Specimen: Anterior Nasal Swab  Result Value Ref Range   SARS Coronavirus 2 by RT PCR NEGATIVE NEGATIVE   Influenza A by PCR NEGATIVE NEGATIVE   Influenza B by PCR NEGATIVE NEGATIVE  Respiratory (~20 pathogens) panel by PCR   Specimen: Nasopharyngeal Swab; Respiratory  Result Value Ref Range   Adenovirus NOT DETECTED NOT DETECTED   Coronavirus 229E NOT DETECTED NOT DETECTED   Coronavirus HKU1 NOT DETECTED NOT DETECTED   Coronavirus NL63 NOT DETECTED NOT DETECTED   Coronavirus OC43 NOT DETECTED NOT DETECTED   Metapneumovirus NOT DETECTED NOT DETECTED   Rhinovirus / Enterovirus NOT DETECTED NOT DETECTED   Influenza A NOT DETECTED NOT DETECTED   Influenza B NOT DETECTED NOT DETECTED   Parainfluenza Virus 1 NOT DETECTED NOT DETECTED   Parainfluenza Virus 2 NOT DETECTED NOT DETECTED   Parainfluenza Virus 3 NOT DETECTED NOT DETECTED   Parainfluenza Virus 4 NOT DETECTED NOT DETECTED   Respiratory Syncytial Virus NOT DETECTED NOT DETECTED   Bordetella pertussis NOT DETECTED NOT DETECTED   Bordetella Parapertussis NOT DETECTED NOT  DETECTED   Chlamydophila pneumoniae NOT DETECTED NOT DETECTED   Mycoplasma pneumoniae NOT DETECTED NOT DETECTED  Basic metabolic panel  Result Value Ref Range   Sodium 142 135 - 145 mmol/L   Potassium 3.8 3.5 - 5.1 mmol/L   Chloride 103 98 - 111 mmol/L   CO2 27 22 - 32 mmol/L   Glucose, Bld 316 (H) 70 - 99 mg/dL   BUN 15 8 - 23 mg/dL   Creatinine, Ser 1.18 (H) 0.44 - 1.00 mg/dL   Calcium 8.9 8.9 - 10.3 mg/dL   GFR, Estimated 46 (L) >60 mL/min   Anion gap 12 5 - 15  CBC  Result Value Ref Range   WBC 7.0 4.0 - 10.5 K/uL   RBC 3.92 3.87 - 5.11 MIL/uL   Hemoglobin 12.6 12.0 - 15.0 g/dL   HCT 38.1 36.0 - 46.0 %   MCV 97.2 80.0 - 100.0 fL   MCH 32.1 26.0 - 34.0 pg   MCHC 33.1 30.0 - 36.0 g/dL   RDW 13.2 11.5 - 15.5 %   Platelets 252 150 - 400 K/uL   nRBC 0.0 0.0 - 0.2 %  Brain natriuretic peptide  Result Value Ref Range   B Natriuretic Peptide 75.6 0.0 - 100.0 pg/mL  CBC  Result Value Ref Range   WBC 7.1 4.0 - 10.5 K/uL   RBC 3.61 (L) 3.87 - 5.11 MIL/uL   Hemoglobin 11.6 (L) 12.0 - 15.0 g/dL   HCT 34.3 (L) 36.0 - 46.0 %   MCV 95.0 80.0 - 100.0 fL   MCH 32.1 26.0 - 34.0 pg   MCHC 33.8 30.0 - 36.0 g/dL   RDW 12.8 11.5 - 15.5 %   Platelets 213 150 - 400 K/uL   nRBC 0.0 0.0 - 0.2 %  Creatinine, serum  Result Value Ref Range   Creatinine, Ser 1.14 (H) 0.44 - 1.00 mg/dL   GFR, Estimated 47 (L) >  60 mL/min  Glucose, capillary  Result Value Ref Range   Glucose-Capillary 193 (H) 70 - 99 mg/dL  Glucose, capillary  Result Value Ref Range   Glucose-Capillary 280 (H) 70 - 99 mg/dL  Glucose, capillary  Result Value Ref Range   Glucose-Capillary 229 (H) 70 - 99 mg/dL  CBC  Result Value Ref Range   WBC 10.4 4.0 - 10.5 K/uL   RBC 3.55 (L) 3.87 - 5.11 MIL/uL   Hemoglobin 11.4 (L) 12.0 - 15.0 g/dL   HCT 33.6 (L) 36.0 - 46.0 %   MCV 94.6 80.0 - 100.0 fL   MCH 32.1 26.0 - 34.0 pg   MCHC 33.9 30.0 - 36.0 g/dL   RDW 13.2 11.5 - 15.5 %   Platelets 228 150 - 400 K/uL   nRBC 0.0 0.0  - 0.2 %  Basic metabolic panel  Result Value Ref Range   Sodium 140 135 - 145 mmol/L   Potassium 3.8 3.5 - 5.1 mmol/L   Chloride 104 98 - 111 mmol/L   CO2 29 22 - 32 mmol/L   Glucose, Bld 253 (H) 70 - 99 mg/dL   BUN 31 (H) 8 - 23 mg/dL   Creatinine, Ser 1.13 (H) 0.44 - 1.00 mg/dL   Calcium 8.5 (L) 8.9 - 10.3 mg/dL   GFR, Estimated 48 (L) >60 mL/min   Anion gap 7 5 - 15  Glucose, capillary  Result Value Ref Range   Glucose-Capillary 199 (H) 70 - 99 mg/dL  Glucose, capillary  Result Value Ref Range   Glucose-Capillary 264 (H) 70 - 99 mg/dL  Glucose, capillary  Result Value Ref Range   Glucose-Capillary 244 (H) 70 - 99 mg/dL  Glucose, capillary  Result Value Ref Range   Glucose-Capillary 187 (H) 70 - 99 mg/dL  Glucose, capillary  Result Value Ref Range   Glucose-Capillary 168 (H) 70 - 99 mg/dL  Glucose, capillary  Result Value Ref Range   Glucose-Capillary 270 (H) 70 - 99 mg/dL  Glucose, capillary  Result Value Ref Range   Glucose-Capillary 211 (H) 70 - 99 mg/dL  Glucose, capillary  Result Value Ref Range   Glucose-Capillary 107 (H) 70 - 99 mg/dL  Glucose, capillary  Result Value Ref Range   Glucose-Capillary 148 (H) 70 - 99 mg/dL  Glucose, capillary  Result Value Ref Range   Glucose-Capillary 133 (H) 70 - 99 mg/dL  Glucose, capillary  Result Value Ref Range   Glucose-Capillary 184 (H) 70 - 99 mg/dL  Basic metabolic panel  Result Value Ref Range   Sodium 139 135 - 145 mmol/L   Potassium 4.4 3.5 - 5.1 mmol/L   Chloride 101 98 - 111 mmol/L   CO2 29 22 - 32 mmol/L   Glucose, Bld 99 70 - 99 mg/dL   BUN 40 (H) 8 - 23 mg/dL   Creatinine, Ser 1.29 (H) 0.44 - 1.00 mg/dL   Calcium 8.9 8.9 - 10.3 mg/dL   GFR, Estimated 41 (L) >60 mL/min   Anion gap 9 5 - 15  Glucose, capillary  Result Value Ref Range   Glucose-Capillary 91 70 - 99 mg/dL  CBG monitoring, ED  Result Value Ref Range   Glucose-Capillary 356 (H) 70 - 99 mg/dL  CBG monitoring, ED  Result Value Ref Range    Glucose-Capillary 289 (H) 70 - 99 mg/dL  CBG monitoring, ED  Result Value Ref Range   Glucose-Capillary 264 (H) 70 - 99 mg/dL  CBG monitoring, ED  Result Value Ref Range  Glucose-Capillary 213 (H) 70 - 99 mg/dL  Troponin I (High Sensitivity)  Result Value Ref Range   Troponin I (High Sensitivity) 5 <18 ng/L  Troponin I (High Sensitivity)  Result Value Ref Range   Troponin I (High Sensitivity) 6 <18 ng/L      Assessment & Plan:   Problem List Items Addressed This Visit     COPD with acute exacerbation (HCC)   Relevant Medications   albuterol (VENTOLIN HFA) 108 (90 Base) MCG/ACT inhaler   Other Visit Diagnoses     Controlled type 2 diabetes mellitus with diabetic nephropathy, without long-term current use of insulin (HCC)    -  Primary   Centrilobular emphysema (HCC)       Relevant Medications   albuterol (VENTOLIN HFA) 108 (90 Base) MCG/ACT inhaler       HFU AECOPD Improved Followed by Gerre Pebbles, has apt 11/28 Establish with [facility] Clinic 11/29  Finish current medications from hospital Prednisone, Doxycycline course. Continue home oxygen 2L continuous No further home health ordered, let me know if you change your mind Prednisone is raising your blood sugar. It will improve when off Prednisone. Discussed options for managing type 2 diabetes, she prefers to avoid injection therapy and insulin or other therapy at the moment. She has historically had A1c 6-7 range, will agree to defer additional therapy at this time.   Meds ordered this encounter  Medications   albuterol (VENTOLIN HFA) 108 (90 Base) MCG/ACT inhaler    Sig: Inhale 2 puffs into the lungs every 6 (six) hours as needed for wheezing or shortness of breath.    Dispense:  1 each    Refill:  3    Follow up plan: Return if symptoms worsen or fail to improve.   Nobie Putnam, DO [facility] 12/11/2021, 2:12 PM

## 2021-12-18 DIAGNOSIS — G4733 Obstructive sleep apnea (adult) (pediatric): Secondary | ICD-10-CM | POA: Diagnosis not present

## 2021-12-18 DIAGNOSIS — R0602 Shortness of breath: Secondary | ICD-10-CM | POA: Diagnosis not present

## 2021-12-18 DIAGNOSIS — R053 Chronic cough: Secondary | ICD-10-CM | POA: Diagnosis not present

## 2021-12-18 DIAGNOSIS — J432 Centrilobular emphysema: Secondary | ICD-10-CM | POA: Diagnosis not present

## 2021-12-19 NOTE — Progress Notes (Unsigned)
   Patient ID: Jenna [name], female    DOB: 02/28/1935, 86 y.o.   MRN: [id number]  HPI  Jenna [name] is a 86 y/o female with a history of  Echo report from 05/18/21 showed an EF of 60-65% along with mild MR and normal PA Pressure of 26.3 mmHg.   Admitted 12/02/21 due to COPD exacerbation. Given steroids. CXR negative for pneumonia. Needed oxygen and unable to be weaned off. 1 dose of IV lasix given due to worsening edema. PT evaluation done. Discharged after 5 days with steroids and antibiotics. Was in the ED 11/02/21 due to COPD exacerbation where she was treated and released.    She presents today for her initial visit with a chief complaint of   Review of Systems    Physical Exam    Assessment & Plan:  1: Chronic heart failure with preserved ejection fraction without structural changes- - NYHA class - BNP 12/02/21 was 75.6  2: DM with CKD- - saw PCP Jenna [name]) 12/11/21 - BMP 12/07/21 reviewed and showed sodium 139, potassium 4.4, creatinine 1.29 & GFR 41 - A1c 07/11/21 was 7.2%  3: COPD- - saw pulmonology Jenna [name]) 12/18/21  4: Paroxsymal SVT- - saw cardiology Jenna [name]) 04/20/21

## 2021-12-20 ENCOUNTER — Ambulatory Visit: Payer: Medicare Other | Attending: Family | Admitting: Family

## 2021-12-20 ENCOUNTER — Encounter: Payer: Self-pay | Admitting: Family

## 2021-12-20 ENCOUNTER — Encounter: Payer: Self-pay | Admitting: Pharmacist

## 2021-12-20 VITALS — BP 114/64 | HR 70 | Resp 18 | Wt 196.1 lb

## 2021-12-20 DIAGNOSIS — J449 Chronic obstructive pulmonary disease, unspecified: Secondary | ICD-10-CM

## 2021-12-20 DIAGNOSIS — J441 Chronic obstructive pulmonary disease with (acute) exacerbation: Secondary | ICD-10-CM | POA: Insufficient documentation

## 2021-12-20 DIAGNOSIS — I5032 Chronic diastolic (congestive) heart failure: Secondary | ICD-10-CM | POA: Diagnosis not present

## 2021-12-20 DIAGNOSIS — E785 Hyperlipidemia, unspecified: Secondary | ICD-10-CM | POA: Insufficient documentation

## 2021-12-20 DIAGNOSIS — N1832 Chronic kidney disease, stage 3b: Secondary | ICD-10-CM

## 2021-12-20 DIAGNOSIS — F419 Anxiety disorder, unspecified: Secondary | ICD-10-CM | POA: Insufficient documentation

## 2021-12-20 DIAGNOSIS — I471 Supraventricular tachycardia, unspecified: Secondary | ICD-10-CM

## 2021-12-20 DIAGNOSIS — E1122 Type 2 diabetes mellitus with diabetic chronic kidney disease: Secondary | ICD-10-CM | POA: Diagnosis not present

## 2021-12-20 DIAGNOSIS — G4730 Sleep apnea, unspecified: Secondary | ICD-10-CM | POA: Diagnosis not present

## 2021-12-20 DIAGNOSIS — K219 Gastro-esophageal reflux disease without esophagitis: Secondary | ICD-10-CM | POA: Diagnosis not present

## 2021-12-20 NOTE — Progress Notes (Signed)
Patient ID: Jenna [name], female   DOB: September 29, 1935, 86 y.o.   MRN: [id number]  [place] - PHARMACIST COUNSELING NOTE  *HFpEF??*  Guideline-Directed Medical Therapy/Evidence Based Medicine  ACE/ARB/ARNI:  none Beta Blocker:  none Aldosterone Antagonist:  none Diuretic: Furosemide 40 mg daily and '40mg'$  PRN for swelling SGLT2i:  none  Adherence Assessment  Do you ever forget to take your medication? '[]'$ Yes '[x]'$ No  Do you ever skip doses due to side effects? '[]'$ Yes '[x]'$ No  Do you have trouble affording your medicines? '[]'$ Yes '[x]'$ No  Are you ever unable to pick up your medication due to transportation difficulties? '[]'$ Yes '[x]'$ No  Do you ever stop taking your medications because you don't believe they are helping? '[]'$ Yes '[x]'$ No  Do you check your weight daily? '[]'$ Yes '[x]'$ No   Adherence strategy: pill bos (arranged by daughter)  Barriers to obtaining medications: none  Vital signs: HR 70, BP 114/64, weight (pounds) 196 lbs  ECHO: Date 05/18/21, EF 60-65%, mild MR and normal PA Pressure of 26.3 mmHg.      Latest Ref Rng & Units 12/07/2021   11:27 AM 12/05/2021    4:42 AM 12/03/2021    3:54 AM  BMP  Glucose 70 - 99 mg/dL 99  253    BUN 8 - 23 mg/dL 40  31    Creatinine 0.44 - 1.00 mg/dL 1.29  1.13  1.14   Sodium 135 - 145 mmol/L 139  140    Potassium 3.5 - 5.1 mmol/L 4.4  3.8    Chloride 98 - 111 mmol/L 101  104    CO2 22 - 32 mmol/L 29  29    Calcium 8.9 - 10.3 mg/dL 8.9  8.5      Past Medical History:  Diagnosis Date   Anxiety    Arthritis    neck, knees(before replacements)   Asthma    CHF (congestive heart failure) (HCC)    CKD (chronic kidney disease), stage III (HCC)    COPD (chronic obstructive pulmonary disease) ([place])    Diabetes mellitus without complication ([place])    Encounter for colonoscopy due to history of adenomatous colonic polyps    GERD (gastroesophageal reflux disease)    Hearing loss of both ears    History  of echocardiogram    a. 10/2013 Echo: EF 60-65%, no rwma, midlly dil LA. Rnl RV fxn.   Hyperlipidemia    Osteoporosis    Palpitations    PSVT (paroxysmal supraventricular tachycardia)    a. 05/2018 [facility]: 99 SVT runs. Fastest 218 x 4:30. Longest 4:38 w/ rate of 172.   Sleep apnea    resolved with gastric bypass   Syncope and collapse    Urine incontinence     ASSESSMENT 86 year old female who presents to the HF clinic for initial assessment. PMH includes  asthma, DM, hyperlipidemia, CKD, anxiety, GERD, COPD, PSVT, sleep apnea, tobacco use and chronic heart failure.  Last ED admission was 12/02/21 due to COPD exacerbation. Noted symptoms improve after COPD treatment and diuresis.   Medication reconciliation completed during OV.   PLAN  Unsure of current HF diagnosis. EF preserved and no structural changes noted. BP at goal as well.  Continue furosemide 40 mg daily plus additional dose for swelling/edema/ fluid retention. Follow up as indicated by NP.  Time spent: 15 minutes  Anesia Blackwell Rodriguez-Guzman PharmD, BCPS 12/20/2021 1:16 PM   Current Outpatient Medications:    albuterol (PROVENTIL) (2.5 MG/3ML) 0.083% nebulizer solution, Take 3  mLs (2.5 mg total) by nebulization every 6 (six) hours as needed for wheezing or shortness of breath., Disp: 360 mL, Rfl: 0   albuterol (VENTOLIN HFA) 108 (90 Base) MCG/ACT inhaler, Inhale 2 puffs into the lungs every 6 (six) hours as needed for wheezing or shortness of breath., Disp: 1 each, Rfl: 3   alendronate (FOSAMAX) 70 MG tablet, Take 1 tablet (70 mg total) by mouth once a week. Take with a full glass of water on an empty stomach., Disp: 12 tablet, Rfl: 3   Ascorbic Acid (VITAMIN C) 1000 MG tablet, Take 500 mg by mouth daily. , Disp: , Rfl:    busPIRone (BUSPAR) 5 MG tablet, Take 1 tablet (5 mg total) by mouth 3 (three) times daily as needed (anxiety)., Disp: 90 tablet, Rfl: 1   Cholecalciferol 25 MCG (1000 UT) tablet, Take 1,000 Units by mouth  daily. , Disp: , Rfl:    diltiazem (CARDIZEM CD) 180 MG 24 hr capsule, Take 1 capsule (180 mg total) by mouth daily., Disp: 90 capsule, Rfl: 0   famotidine (PEPCID) 20 MG tablet, Take 20 mg by mouth 2 (two) times daily., Disp: , Rfl:    ferrous sulfate 325 (65 FE) MG EC tablet, Take 325 mg by mouth at bedtime. , Disp: , Rfl:    furosemide (LASIX) 20 MG tablet, Take 1 tablet (20 mg total) by mouth daily as needed for fluid or edema. Take lasix daily, with extra dose as needed for swelling, Disp: 90 tablet, Rfl: 3   gabapentin (NEURONTIN) 300 MG capsule, Take 1 capsule (300 mg total) by mouth at bedtime., Disp: 90 capsule, Rfl: 3   ipratropium-albuterol (DUONEB) 0.5-2.5 (3) MG/3ML SOLN, Take 3 mLs by nebulization every 4 (four) hours as needed., Disp: 720 mL, Rfl: 5   methocarbamol (ROBAXIN) 500 MG tablet, Take 1 tablet (500 mg total) by mouth every 8 (eight) hours as needed for muscle spasms., Disp: 90 tablet, Rfl: 3   Multiple Vitamins-Minerals (ONE-A-DAY WOMENS PETITES) TABS, Take 2 tablets by mouth every morning., Disp: , Rfl:    pantoprazole (PROTONIX) 40 MG tablet, Take 1 tablet (40 mg total) by mouth daily., Disp: 30 tablet, Rfl: 3   sertraline (ZOLOFT) 100 MG tablet, Take 1 tablet (100 mg total) by mouth daily., Disp: 90 tablet, Rfl: 3   simvastatin (ZOCOR) 40 MG tablet, Take 1 tablet (40 mg total) by mouth daily., Disp: 90 tablet, Rfl: 3   SUPER B COMPLEX/C PO, Take 1 tablet by mouth daily., Disp: , Rfl:    traZODone (DESYREL) 100 MG tablet, Take 1 tablet (100 mg total) by mouth at bedtime., Disp: 90 tablet, Rfl: 3   TRELEGY ELLIPTA 100-62.5-25 MCG/INH AEPB, Inhale 1 puff into the lungs daily., Disp: , Rfl:   MEDICATION ADHERENCES TIPS AND STRATEGIES Taking medication as prescribed improves patient outcomes in heart failure (reduces hospitalizations, improves symptoms, increases survival) Side effects of medications can be managed by decreasing doses, switching agents, stopping drugs, or  adding additional therapy. Please let someone in the [facility] know if you have having bothersome side effects so we can modify your regimen. Do not alter your medication regimen without talking to [place].  Medication reminders can help patients remember to take drugs on time. If you are missing or forgetting doses you can try linking behaviors, using pill boxes, or an electronic reminder like an alarm on your phone or an app. Some people can also get automated phone calls as medication reminders.

## 2021-12-20 NOTE — Patient Instructions (Addendum)
Begin weighing daily and call for an overnight weight gain of 3 pounds or more or a weekly weight gain of more than 5 pounds.   If you have voicemail, please make sure your mailbox is cleaned out so that we may leave a message and please make sure to listen to any voicemails.    Keep daily fluid intake to 60-64 ounces.

## 2021-12-21 ENCOUNTER — Other Ambulatory Visit: Payer: Self-pay | Admitting: Family Medicine

## 2021-12-21 DIAGNOSIS — R296 Repeated falls: Secondary | ICD-10-CM | POA: Diagnosis not present

## 2021-12-21 DIAGNOSIS — J449 Chronic obstructive pulmonary disease, unspecified: Secondary | ICD-10-CM | POA: Diagnosis not present

## 2021-12-21 DIAGNOSIS — M1990 Unspecified osteoarthritis, unspecified site: Secondary | ICD-10-CM | POA: Diagnosis not present

## 2021-12-21 MED ORDER — DILTIAZEM HCL ER COATED BEADS 180 MG PO CP24: 180.0000 mg | ORAL_CAPSULE | Freq: Every day | ORAL | 0 refills | Status: DC

## 2021-12-21 MED ORDER — PANTOPRAZOLE SODIUM 40 MG PO TBEC: 40.0000 mg | DELAYED_RELEASE_TABLET | Freq: Every day | ORAL | 3 refills | Status: DC

## 2021-12-21 NOTE — Telephone Encounter (Signed)
Medication Refill - Medication: tiadylt er 180 /and  pantoprazole (PROTONIX) 40 MG tablet  Has the patient contacted their pharmacy? yes (Agent: If no, request that the patient contact the pharmacy for the refill. If patient does not wish to contact the pharmacy document the reason why and proceed with request.) (Agent: If yes, when and what did the pharmacy advise?)said contacted them but no info in system about it  Preferred Pharmacy (with phone number or street name):  [facility], [place], [place] Phone: [phone]  Fax: [phone]     Has the patient been seen for an appointment in the last year OR does the patient have an upcoming appointment? yes  Agent: Please be advised that RX refills may take up to 3 business days. We ask that you follow-up with your pharmacy.

## 2021-12-24 ENCOUNTER — Telehealth: Payer: Self-pay | Admitting: Cardiovascular Disease

## 2021-12-24 NOTE — Telephone Encounter (Signed)
Spoke to pt. She report last night she received a notification from her apple watch stating she was in afib. Then the morning she reports she felt like her heart rate was rapid and felt very weak. Pt stated she feels ok now. BP 111/64 HR 96  Appointment scheduled for tomorrow 12/5 for further evaluations.

## 2021-12-24 NOTE — Progress Notes (Unsigned)
Date:  67/01/2456   ID:  Jenna [name], DOB December 02, 1935, MRN [id number]  Patient Location:  [place] [id number]   Provider location:   [facility], [place] office  PCP:  Olin Hauser, DO  Cardiologist:  Arvid Right [facility]  Chief Complaint  Patient presents with   Atrial Fibrillation    Patient c/o tachycardia spells with shortness of breath and palpitations that started on 12/23/21. Patient is feeling better today. Medications reviewed by the patient verbally.      History of Present Illness:    Jenna [name] is a 86 y.o. female past medical history of obesity,  Hypertension, hyperlipidemia,  GERD,  chronic cough,  gastric bypass surgery,  depression,   chronic leg edema  Type 2 diabetes hemoglobin A1c 6.2 COPD, hospitalizations for COPD exacerbation, followed by Dr. Raul Del Former smoker Stage III chronic kidney disease Stable chronic problem with mood, anxiety, secondary insomnia Periodic hospitalization for COPD exacerbation April 2022, February 23 presents for evaluation of pericardial effusion seen on CT scan,  Palpitations  Last seen in clinic by myself March 2023  Hospital 11/23, records reviewed COPD exacerbation. on steroid.  IV lasix, ABX Bronchitis Feels today she has made full recovery, breathing is stable Saturations in the 90s  Reports episode of paroxysmal tachycardia at home 2 days ago  12/23/2021, tachycardia at night, woke her up Got up, walked around, seem to last for a while eventually able to get back to sleep, sleeps in a chair/recliner Reports that her watch was reading A-fib spells, she does not have the EKGs to review No prior documentation of atrial fibrillation or flutter  Today feels well, remains on Lasix daily denies significant leg swelling No chest pain concerning for angina  EKG personally reviewed by myself on todays visit Normal sinus rhythm with rate 79  bpm no significant ST-T wave changes, left axis deviation  Other past medical history reviewed hospital February 2023 for COPD Acute bronchitis with acute on chronic COPD exacerbation Treated with steroids, nebulizers Received doxycycline 7 days  Stress at home, son with CAD, MI Daughter living at their house,   hospital for treatment of COPD exacerbation Normal echo 04/24/2020  Metoprolol reduced to 12.5 at discharge Steroids, ABX,   Prior monitor reviewed May 2020, showed normal sinus rhythm with 99 SVT runs lasting up to 4 minutes 30 secs, max rate of 210 bpm, and average rate 172 bpm.  Prior history of chronic cough. Her other big complaint didn't GERD symptoms. Several other family members have GERD as well. She has had chronic lower extremity edema, prior trauma to her left lower extremity. She takes Lasix 20 mg daily with no significant improvement     Past Medical History:  Diagnosis Date   Anxiety    Arthritis    neck, knees(before replacements)   Asthma    CHF (congestive heart failure) (HCC)    CKD (chronic kidney disease), stage III (HCC)    COPD (chronic obstructive pulmonary disease) ([place])    Diabetes mellitus without complication ([place])    Encounter for colonoscopy due to history of adenomatous colonic polyps    GERD (gastroesophageal reflux disease)    Hearing loss of both ears    History of echocardiogram    a. 10/2013 Echo: EF 60-65%, no rwma, midlly dil LA. Rnl RV fxn.   Hyperlipidemia    Osteoporosis    Palpitations    PSVT (paroxysmal  supraventricular tachycardia)    a. 05/2018 Zio: 99 SVT runs. Fastest 218 x 4:30. Longest 4:38 w/ rate of 172.   Sleep apnea    resolved with gastric bypass   Syncope and collapse    Urine incontinence    Past Surgical History:  Procedure Laterality Date   BROW LIFT Bilateral 06/27/2015   Procedure: BLEPHAROPLASTY BILATERAL UPPER EYELIDS BILATERAL BLEPHAROTOSIS EYELIDS;  Surgeon: Karle Starch, MD;  Location: [place];  Service: Ophthalmology;  Laterality: Bilateral;  BILATERAL   CATARACT EXTRACTION W/PHACO Right 02/13/2015   Procedure: CATARACT EXTRACTION PHACO AND INTRAOCULAR LENS PLACEMENT (IOC);  Surgeon: Ronnell Freshwater, MD;  Location: [place];  Service: Ophthalmology;  Laterality: Right;   CATARACT EXTRACTION W/PHACO Left 03/22/2015   Procedure: CATARACT EXTRACTION PHACO AND INTRAOCULAR LENS PLACEMENT (IOC);  Surgeon: Ronnell Freshwater, MD;  Location: [place];  Service: Ophthalmology;  Laterality: Left;  [place]   COLECTOMY     COSMETIC SURGERY  2012   tummy tuck and excess skin removal   GASTRIC BYPASS  2010   REPLACEMENT TOTAL KNEE BILATERAL  1998   SHOULDER SURGERY  2009   left    TONSILLECTOMY     TOTAL ABDOMINAL HYSTERECTOMY  1979     Current Outpatient Medications on File Prior to Visit  Medication Sig Dispense Refill   albuterol (PROVENTIL) (2.5 MG/3ML) 0.083% nebulizer solution Take 3 mLs (2.5 mg total) by nebulization every 6 (six) hours as needed for wheezing or shortness of breath. 360 mL 0   albuterol (VENTOLIN HFA) 108 (90 Base) MCG/ACT inhaler Inhale 2 puffs into the lungs every 6 (six) hours as needed for wheezing or shortness of breath. 1 each 3   alendronate (FOSAMAX) 70 MG tablet Take 1 tablet (70 mg total) by mouth once a week. Take with a full glass of water on an empty stomach. 12 tablet 3   Ascorbic Acid (VITAMIN C) 1000 MG tablet Take 500 mg by mouth daily.      busPIRone (BUSPAR) 5 MG tablet Take 1 tablet (5 mg total) by mouth 3 (three) times daily as needed (anxiety). 90 tablet 1   Cholecalciferol 25 MCG (1000 UT) tablet Take 1,000 Units by mouth daily.      diltiazem (CARDIZEM CD) 180 MG 24 hr capsule Take 1 capsule (180 mg total) by mouth daily. 90 capsule 0   famotidine (PEPCID) 20 MG tablet Take 20 mg by mouth 2 (two) times daily.     ferrous sulfate 325 (65 FE) MG EC tablet Take 325 mg by mouth at  bedtime.      furosemide (LASIX) 20 MG tablet Take 1 tablet (20 mg total) by mouth daily as needed for fluid or edema. Take lasix daily, with extra dose as needed for swelling 90 tablet 3   gabapentin (NEURONTIN) 300 MG capsule Take 1 capsule (300 mg total) by mouth at bedtime. 90 capsule 3   ipratropium-albuterol (DUONEB) 0.5-2.5 (3) MG/3ML SOLN Take 3 mLs by nebulization every 4 (four) hours as needed. 720 mL 5   methocarbamol (ROBAXIN) 500 MG tablet Take 1 tablet (500 mg total) by mouth every 8 (eight) hours as needed for muscle spasms. 90 tablet 3   Multiple Vitamins-Minerals (ONE-A-DAY WOMENS PETITES) TABS Take 2 tablets by mouth every morning.     pantoprazole (PROTONIX) 40 MG tablet Take 1 tablet (40 mg total) by mouth daily. 30 tablet 3   sertraline (ZOLOFT) 100 MG tablet  Take 1 tablet (100 mg total) by mouth daily. 90 tablet 3   simvastatin (ZOCOR) 40 MG tablet Take 1 tablet (40 mg total) by mouth daily. 90 tablet 3   SUPER B COMPLEX/C PO Take 1 tablet by mouth daily.     traZODone (DESYREL) 100 MG tablet Take 1 tablet (100 mg total) by mouth at bedtime. 90 tablet 3   TRELEGY ELLIPTA 100-62.5-25 MCG/INH AEPB Inhale 1 puff into the lungs daily.     No current facility-administered medications on file prior to visit.    Allergies:   Azithromycin, Hydromorphone, Morphine, Nsaids, Oxycodone-acetaminophen, Codeine, Nabumetone, Oxycodone, Oxycodone-acetaminophen, and Promethazine hcl   Social History   Tobacco Use   Smoking status: Former    Packs/day: 1.00    Years: 20.00    Total pack years: 20.00    Types: Cigarettes    Quit date: 07/1981    Years since quitting: 40.4   Smokeless tobacco: Former  Scientific laboratory technician Use: Never used  Substance Use Topics   Alcohol use: Not Currently    Comment: past   Drug use: No      Family Hx: The patient's family history includes Colon cancer in her sister; Diabetes type II in her father and mother; Heart attack in her brother and  mother; Heart attack (age of onset: 56) in her sister; Hyperlipidemia in her sister; Hypertension in her mother and sister; Ovarian cancer in her sister; Pneumonia in her father; Skin cancer in her father.  ROS:   Please see the history of present illness.    Review of Systems  Constitutional: Negative.   HENT: Negative.    Respiratory: Negative.    Cardiovascular:  Positive for palpitations.  Gastrointestinal: Negative.   Musculoskeletal: Negative.   Neurological: Negative.   Psychiatric/Behavioral: Negative.    All other systems reviewed and are negative.    Labs/Other Tests and Data Reviewed:    Recent Labs: 02/05/2021: TSH 5.75 11/01/2021: ALT 23 12/02/2021: B Natriuretic Peptide 75.6 12/05/2021: Hemoglobin 11.4; Platelets 228 12/07/2021: BUN 40; Creatinine, Ser 1.29; Potassium 4.4; Sodium 139   Recent Lipid Panel Lab Results  Component Value Date/Time   CHOL 167 02/05/2021 08:11 AM   TRIG 182 (H) 02/05/2021 08:11 AM   HDL 72 02/05/2021 08:11 AM   CHOLHDL 2.3 02/05/2021 08:11 AM   LDLCALC 69 02/05/2021 08:11 AM    Wt Readings from Last 3 Encounters:  12/25/21 197 lb 2 oz (89.4 kg)  12/20/21 196 lb 2 oz (89 kg)  12/11/21 210 lb (95.3 kg)     Exam:    BP 110/60 (BP Location: Left Arm, Patient Position: Sitting, Cuff Size: Large)   Pulse 79   Ht '5\' 1"'$  (1.549 m)   Wt 197 lb 2 oz (89.4 kg)   SpO2 92%   BMI 37.25 kg/m  Constitutional:  oriented to person, place, and time. No distress.  HENT:  Head: Grossly normal Eyes:  no discharge. No scleral icterus.  Neck: No JVD, no carotid bruits  Cardiovascular: Regular rate and rhythm, no murmurs appreciated Pulmonary/Chest: Clear to auscultation bilaterally, no wheezes or rails Abdominal: Soft.  no distension.  no tenderness.  Musculoskeletal: Normal range of motion Neurological:  normal muscle tone. Coordination normal. No atrophy Skin: Skin warm and dry Psychiatric: normal affect, pleasant  ASSESSMENT & PLAN:     Paroxysmal tachycardia (HCC) Reports that her watch was reading a paroxysmal atrial fibrillation Zio monitor ordered to rule out arrhythmia Normal sinus rhythm on today's  visit She is off metoprolol presumably for underlying COPD Tolerating diltiazem extended release 180  Home stress Daughter living at her house, son with medical issues Presents today with her husband  Centrilobular emphysema ([place]) Recent hospitalization for COPD exacerbation requiring antibiotics, nebulizers, steroids, followed by Dr. Raul Del On inhalers at home Sedentary, deconditioned  Controlled type 2 diabetes mellitus with diabetic nephropathy, without long-term current use of insulin ([place]) We have encouraged continued exercise, careful diet management in an effort to lose weight.  Leg swelling Consistent with lymphedema,Ace wraps,  Leg elevation recommended Stable  Hyperlipidemia associated with type 2 diabetes mellitus ([place]) Cholesterol is at goal on the current lipid regimen. No changes to the medications were made.  Essential hypertension Blood pressure is well controlled on today's visit. No changes made to the medications.  Smoker Stopped smoking many years ago Still with residual COPD Continues to have COPD exacerbation hospitalizations   Total encounter time more than 40 minutes  Greater than 50% was spent in counseling and coordination of care with the patient     Signed, Ida Rogue, MD  12/25/2021 12:09 PM    [place] Office [address], [place], [place] [address]

## 2021-12-24 NOTE — Telephone Encounter (Signed)
  Patient c/o Palpitations:  High priority if patient c/o lightheadedness, shortness of breath, or chest pain  How long have you had palpitations/irregular HR/ Afib? Are you having the symptoms now? Yes   Are you currently experiencing lightheadedness, SOB or CP? Feeling weak  Do you have a history of afib (atrial fibrillation) or irregular heart rhythm? Yes   Have you checked your BP or HR? (document readings if available): none   Are you experiencing any other symptoms?   Pt said, the other day her apple watch notified her that she's in afib and again today. Pt said she's been feeling really weak. She said, to call her at [phone]

## 2021-12-25 ENCOUNTER — Ambulatory Visit: Payer: Medicare Other | Attending: Cardiovascular Disease | Admitting: Cardiovascular Disease

## 2021-12-25 ENCOUNTER — Ambulatory Visit (INDEPENDENT_AMBULATORY_CARE_PROVIDER_SITE_OTHER): Payer: Medicare Other

## 2021-12-25 ENCOUNTER — Encounter: Payer: Self-pay | Admitting: Cardiovascular Disease

## 2021-12-25 VITALS — BP 110/60 | HR 79 | Ht 61.0 in | Wt 197.1 lb

## 2021-12-25 DIAGNOSIS — N1832 Chronic kidney disease, stage 3b: Secondary | ICD-10-CM

## 2021-12-25 DIAGNOSIS — I5032 Chronic diastolic (congestive) heart failure: Secondary | ICD-10-CM | POA: Diagnosis not present

## 2021-12-25 DIAGNOSIS — R600 Localized edema: Secondary | ICD-10-CM | POA: Diagnosis not present

## 2021-12-25 DIAGNOSIS — I471 Supraventricular tachycardia, unspecified: Secondary | ICD-10-CM | POA: Diagnosis not present

## 2021-12-25 DIAGNOSIS — E782 Mixed hyperlipidemia: Secondary | ICD-10-CM | POA: Diagnosis not present

## 2021-12-25 DIAGNOSIS — J432 Centrilobular emphysema: Secondary | ICD-10-CM | POA: Diagnosis not present

## 2021-12-25 DIAGNOSIS — E1121 Type 2 diabetes mellitus with diabetic nephropathy: Secondary | ICD-10-CM | POA: Diagnosis not present

## 2021-12-25 DIAGNOSIS — R7989 Other specified abnormal findings of blood chemistry: Secondary | ICD-10-CM

## 2021-12-25 DIAGNOSIS — E1122 Type 2 diabetes mellitus with diabetic chronic kidney disease: Secondary | ICD-10-CM | POA: Diagnosis not present

## 2021-12-25 DIAGNOSIS — J449 Chronic obstructive pulmonary disease, unspecified: Secondary | ICD-10-CM | POA: Diagnosis not present

## 2021-12-25 NOTE — Patient Instructions (Addendum)
Medication Instructions:  No changes  If you need a refill on your cardiac medications before your next appointment, please call your pharmacy.   Lab work: No new labs needed  Testing/Procedures: Your physician has recommended that you wear a Zio monitor.   This monitor is a medical device that records the heart's electrical activity. Doctors most often use these monitors to diagnose arrhythmias. Arrhythmias are problems with the speed or rhythm of the heartbeat. The monitor is a small device applied to your chest. You can wear one while you do your normal daily activities. While wearing this monitor if you have any symptoms to push the button and record what you felt. Once you have worn this monitor for the period of time provider prescribed (Usually 14 days), you will return the monitor device in the postage paid box. Once it is returned they will download the data collected and provide [place] with a report which the provider will then review and we will call you with those results. Important tips:  Avoid showering during the first 24 hours of wearing the monitor. Avoid excessive sweating to help maximize wear time. Do not submerge the device, no hot tubs, and no swimming pools. Keep any lotions or oils away from the patch. After 24 hours you may shower with the patch on. Take brief showers with your back facing the shower head.  Do not remove patch once it has been placed because that will interrupt data and decrease adhesive wear time. Push the button when you have any symptoms and write down what you were feeling. Once you have completed wearing your monitor, remove and place into box which has postage paid and place in your outgoing mailbox.  If for some reason you have misplaced your box then call our office and we can provide another box and/or mail it off for you.  Follow-Up: At [facility], you and your health needs are our priority.  As part of our continuing mission to provide you  with exceptional heart care, we have created designated Provider Care Teams.  These Care Teams include your primary Cardiologist (physician) and Advanced Practice Providers (APPs -  Physician Assistants and Nurse Practitioners) who all work together to provide you with the care you need, when you need it.  You will need a follow up appointment in 12 months  Providers on your designated Care Team:   Murray Hodgkins, NP Christell Faith, PA-C Cadence Kathlen Mody, [place]  COVID-19 Vaccine Information can be found at: [email or web address] For questions related to vaccine distribution or appointments, please email [email or web address] or call [phone].

## 2021-12-26 ENCOUNTER — Telehealth: Payer: Self-pay | Admitting: Family Medicine

## 2021-12-26 DIAGNOSIS — E1169 Type 2 diabetes mellitus with other specified complication: Secondary | ICD-10-CM

## 2021-12-26 NOTE — Telephone Encounter (Signed)
Caller states she has been filling simvastatin (ZOCOR) 40 MG tablet for several months and states there is a drug interaction with diltiazem (CARDIZEM CD) 180 MG 24 hr capsule.   Caller unsure if patient is filling diltiazem (CARDIZEM CD) 180 MG 24 hr capsule  at another pharmacy. Also if patient is on diltiazem (CARDIZEM CD) 180 MG 24 hr capsule and simvastatin at the same time, simvastatin should be no more then 10 MG.   Caller requesting to speak with PCP nurse seeking clarity.

## 2021-12-27 DIAGNOSIS — I471 Supraventricular tachycardia, unspecified: Secondary | ICD-10-CM | POA: Diagnosis not present

## 2021-12-31 MED ORDER — ATORVASTATIN CALCIUM 20 MG PO TABS: 20.0000 mg | ORAL_TABLET | Freq: Every day | ORAL | 3 refills | Status: DC

## 2021-12-31 NOTE — Telephone Encounter (Signed)
Please notify patient to DISCONTINUE Simvastatin '40mg'$  and SWITCH to Atorvastatin '20mg'$  nightly.  Due to drug interaction with Diltiazem.  Nobie Putnam, DO [facility] 12/31/2021, 5:44 PM

## 2022-01-03 NOTE — Telephone Encounter (Signed)
Attempted to call the patient again, I left a message letting her know what Dr. Raliegh Ip said.

## 2022-01-07 DIAGNOSIS — J441 Chronic obstructive pulmonary disease with (acute) exacerbation: Secondary | ICD-10-CM | POA: Diagnosis not present

## 2022-01-17 DIAGNOSIS — I471 Supraventricular tachycardia, unspecified: Secondary | ICD-10-CM | POA: Diagnosis not present

## 2022-01-21 DIAGNOSIS — J449 Chronic obstructive pulmonary disease, unspecified: Secondary | ICD-10-CM | POA: Diagnosis not present

## 2022-01-29 ENCOUNTER — Telehealth: Payer: Self-pay | Admitting: Cardiovascular Disease

## 2022-01-29 NOTE — Telephone Encounter (Signed)
I spoke with patient regarding her monitor results and Dr. Donivan Scull recommendations to come in to discuss her atrial fibrillation diagnosis and the potential start of anticoagulation.  The patient voices understanding of these results and recommendations and is agreeable.  She has been scheduled to see Ignacia Bayley, NP on 02/01/22 at 2:45 pm.   The patient was very appreciative of the call today.

## 2022-01-29 NOTE — Telephone Encounter (Signed)
Minna Merritts, MD 01/27/2022 11:38 AM EST     Event monitor Shows normal rhythm with paroxysmal atrial fibrillation Would recommend office visit to discuss starting anticoagulation for stroke prevention Can be scheduled with any provider to expedite medication change

## 2022-02-01 ENCOUNTER — Encounter: Payer: Self-pay | Admitting: Nurse Practitioner

## 2022-02-01 ENCOUNTER — Ambulatory Visit: Payer: Medicare Other | Attending: Nurse Practitioner | Admitting: Nurse Practitioner

## 2022-02-01 ENCOUNTER — Other Ambulatory Visit
Admission: RE | Admit: 2022-02-01 | Discharge: 2022-02-01 | Disposition: A | Discharge status: Home / Self Care | Payer: Medicare Other | Source: Ambulatory Visit | Attending: Nurse Practitioner | Admitting: Nurse Practitioner

## 2022-02-01 ENCOUNTER — Other Ambulatory Visit: Payer: Self-pay | Admitting: *Deleted

## 2022-02-01 VITALS — BP 120/70 | HR 83 | Ht 61.0 in | Wt 200.5 lb

## 2022-02-01 DIAGNOSIS — E1121 Type 2 diabetes mellitus with diabetic nephropathy: Secondary | ICD-10-CM

## 2022-02-01 DIAGNOSIS — Z79899 Other long term (current) drug therapy: Secondary | ICD-10-CM | POA: Diagnosis not present

## 2022-02-01 DIAGNOSIS — I480 Paroxysmal atrial fibrillation: Secondary | ICD-10-CM | POA: Insufficient documentation

## 2022-02-01 DIAGNOSIS — E782 Mixed hyperlipidemia: Secondary | ICD-10-CM

## 2022-02-01 DIAGNOSIS — E876 Hypokalemia: Secondary | ICD-10-CM | POA: Diagnosis not present

## 2022-02-01 DIAGNOSIS — N183 Chronic kidney disease, stage 3 unspecified: Secondary | ICD-10-CM | POA: Diagnosis not present

## 2022-02-01 DIAGNOSIS — I5032 Chronic diastolic (congestive) heart failure: Secondary | ICD-10-CM

## 2022-02-01 LAB — CBC
HCT: 38.9 % (ref 36.0–46.0)
Hemoglobin: 13 g/dL (ref 12.0–15.0)
MCH: 31.6 pg (ref 26.0–34.0)
MCHC: 33.4 g/dL (ref 30.0–36.0)
MCV: 94.4 fL (ref 80.0–100.0)
Platelets: 206 10*3/uL (ref 150–400)
RBC: 4.12 MIL/uL (ref 3.87–5.11)
RDW: 12.6 % (ref 11.5–15.5)
WBC: 7.4 10*3/uL (ref 4.0–10.5)
nRBC: 0 % (ref 0.0–0.2)

## 2022-02-01 LAB — BASIC METABOLIC PANEL
Anion gap: 9 (ref 5–15)
BUN: 13 mg/dL (ref 8–23)
CO2: 30 mmol/L (ref 22–32)
Calcium: 8.8 mg/dL — ABNORMAL LOW (ref 8.9–10.3)
Chloride: 101 mmol/L (ref 98–111)
Creatinine, Ser: 1 mg/dL (ref 0.44–1.00)
GFR, Estimated: 56 mL/min — ABNORMAL LOW (ref >60)
Glucose, Bld: 184 mg/dL — ABNORMAL HIGH (ref 70–99)
Potassium: 2.9 mmol/L — ABNORMAL LOW (ref 3.5–5.1)
Sodium: 140 mmol/L (ref 135–145)

## 2022-02-01 LAB — MAGNESIUM: Magnesium: 1.5 mg/dL — ABNORMAL LOW (ref 1.7–2.4)

## 2022-02-01 LAB — TSH: TSH: 3.019 u[IU]/mL (ref 0.350–4.500)

## 2022-02-01 MED ORDER — MAGNESIUM 400 MG PO CAPS: 400.0000 mg | ORAL_CAPSULE | Freq: Every day | ORAL | Status: DC

## 2022-02-01 MED ORDER — APIXABAN 5 MG PO TABS: 5.0000 mg | ORAL_TABLET | Freq: Two times a day (BID) | ORAL | 6 refills | Status: DC

## 2022-02-01 MED ORDER — DILTIAZEM HCL ER COATED BEADS 240 MG PO CP24: 240.0000 mg | ORAL_CAPSULE | Freq: Every day | ORAL | 3 refills | Status: DC

## 2022-02-01 MED ORDER — POTASSIUM CHLORIDE CRYS ER 20 MEQ PO TBCR: 40.0000 meq | EXTENDED_RELEASE_TABLET | Freq: Two times a day (BID) | ORAL | 3 refills | Status: DC

## 2022-02-01 MED ORDER — APIXABAN 5 MG PO TABS: 5.0000 mg | ORAL_TABLET | Freq: Two times a day (BID) | ORAL | 0 refills | Status: DC

## 2022-02-01 NOTE — Patient Instructions (Addendum)
Medication Instructions:  - Your physician has recommended you make the following change in your medication:   1) START Eliquis 5 mg: - take 1 tablet by mouth TWICE daily (or every 12 hours)  2) INCREASE Diltiazem to 240 mg: - take 1 capsule by mouth ONCE daily    Samples Given: (& Free 30 day trial card provided) Eliquis 5 mg Lot: [id number] Exp: June 2025 # 3 boxes  *If you need a refill on your cardiac medications before your next appointment, please call your pharmacy*   Lab Work: - Your physician recommends that you have lab work today:   BMP/ CBC/ TSH/ Magnesium  Nature conservation officer at [facility] 1st desk on the right to check in (REGISTRATION)  Lab hours: Monday- Friday (7:30 am- 5:30 pm)   If you have labs (blood work) drawn today and your tests are completely normal, you will receive your results only by: MyChart Message (if you have MyChart) OR A paper copy in the mail If you have any lab test that is abnormal or we need to change your treatment, we will call you to review the results.   Testing/Procedures: - none ordered   Follow-Up: At [facility], you and your health needs are our priority.  As part of our continuing mission to provide you with exceptional heart care, we have created designated Provider Care Teams.  These Care Teams include your primary Cardiologist (physician) and Advanced Practice Providers (APPs -  Physician Assistants and Nurse Practitioners) who all work together to provide you with the care you need, when you need it.  We recommend signing up for the patient portal called "MyChart".  Sign up information is provided on this After Visit Summary.  MyChart is used to connect with patients for Virtual Visits (Telemedicine).  Patients are able to view lab/test results, encounter notes, upcoming appointments, etc.  Non-urgent messages can be sent to your provider as well.   To learn more about what you can do with MyChart, go to  [email or web address].    Your next appointment:   4-6 week(s)  Provider:   You may see Ida Rogue, MD or one of the following Advanced Practice Providers on your designated Care Team:   Murray Hodgkins, NP   Other Instructions  Apixaban Tablets What is this medication? APIXABAN (a PIX a ban) prevents and treats blood clots. It is also used to lower the risk of stroke in people with AFib (atrial fibrillation). It belongs to a group of medications called blood thinners. This medicine may be used for other purposes; ask your health care provider or pharmacist if you have questions. COMMON BRAND NAME(S): Eliquis What should I tell my care team before I take this medication? They need to know if you have any of these conditions: Antiphospholipid antibody syndrome Bleeding disorder History of bleeding in the brain History of blood clots History of stomach bleeding Kidney disease Liver disease Mechanical heart valve Spinal surgery An unusual or allergic reaction to apixaban, other medications, foods, dyes, or preservatives Pregnant or trying to get pregnant Breastfeeding How should I use this medication? Take this medication by mouth. For your therapy to work as well as possible, take each dose exactly as prescribed on the prescription label. Do not skip doses. Skipping doses or stopping this medication can increase your risk of a blood clot or stroke. Keep taking this medication unless your care team tells you to stop. Take it as directed on the prescription label at the same  time every day. You can take it with or without food. If it upsets your stomach, take it with food. A special MedGuide will be given to you by the pharmacist with each prescription and refill. Be sure to read this information carefully each time. Talk to your care team about the use of this medication in children. Special care may be needed. Overdosage: If you think you have taken too much of this medicine  contact a poison control center or emergency room at once. NOTE: This medicine is only for you. Do not share this medicine with others. What if I miss a dose? If you miss a dose, take it as soon as you can. If it is almost time for your next dose, take only that dose. Do not take double or extra doses. What may interact with this medication? This medication may interact with the following: Aspirin and aspirin-like medications Certain medications for fungal infections like itraconazole and ketoconazole Certain medications for seizures like carbamazepine and phenytoin Certain medications for blood clots like enoxaparin, dalteparin, heparin, and warfarin Clarithromycin NSAIDs, medications for pain and inflammation, like ibuprofen or naproxen Rifampin Ritonavir St. John's wort This list may not describe all possible interactions. Give your health care provider a list of all the medicines, herbs, non-prescription drugs, or dietary supplements you use. Also tell them if you smoke, drink alcohol, or use illegal drugs. Some items may interact with your medicine. What should I watch for while using this medication? Visit your care team for regular checks on your progress. Your condition will be monitored carefully while you are receiving this medication. You may need blood work while taking this medication. Avoid sports and activities that might cause injury while you are using this medication. Severe falls or injuries can cause unseen bleeding. Be careful when using sharp tools or knives. Consider using an Copy. Take special care brushing or flossing your teeth. Report any injuries, bruising, or red spots on the skin to your care team. If you are going to need surgery or other procedure, tell your care team that you are taking this medication. Wear a medical ID bracelet or chain. Carry a card that describes your condition. List the medications and doses you take on the card. What side effects  may I notice from receiving this medication? Side effects that you should report to your care team as soon as possible: Allergic reactions--skin rash, itching, hives, swelling of the face, lips, tongue, or throat Bleeding--bloody or black, tar-like stools, vomiting blood or brown material that looks like coffee grounds, red or dark brown urine, small red or purple spots on the skin, unusual bruising or bleeding Bleeding in the brain--severe headache, stiff neck, confusion, dizziness, change in vision, numbness or weakness of the face, arm, or leg, trouble speaking, trouble walking, vomiting Heavy periods This list may not describe all possible side effects. Call your doctor for medical advice about side effects. You may report side effects to FDA at 1-800-FDA-1088. Where should I keep my medication? Keep out of the reach of children and pets. Store at room temperature between 20 and 25 degrees C (68 and 77 degrees F). Get rid of any unused medication after the expiration date. To get rid of medications that are no longer needed or expired: Take the medication to a medication take-back program. Check with your pharmacy or law enforcement to find a location. If you cannot return the medication, check the label or package insert to see if the medication should  be thrown out in the garbage or flushed down the toilet. If you are not sure, ask your care team. If it is safe to put in the trash, empty the medication out of the container. Mix the medication with cat litter, dirt, coffee grounds, or other unwanted substance. Seal the mixture in a bag or container. Put it in the trash. NOTE: This sheet is a summary. It may not cover all possible information. If you have questions about this medicine, talk to your doctor, pharmacist, or health care provider.  2023 Elsevier/Gold Standard (2020-02-04 00:00:00)

## 2022-02-01 NOTE — Progress Notes (Signed)
Office Visit    Patient Name: Jenna [name] Date of Encounter: 02/01/2022  Primary Care Provider:  Olin Hauser, DO Primary Cardiologist:  Ida Rogue, MD  Chief Complaint    87 y/o ? w/a h/o chronic HFpEF, hyperlipidemia, type 2 diabetes mellitus, stage III chronic kidney disease, anxiety, COPD, syncope, and PSVT, presents for follow-up related to recent diagnosis of paroxysmal atrial fibrillation on outpatient monitoring.  Past Medical History    Past Medical History:  Diagnosis Date   Anxiety    Arthritis    neck, knees(before replacements)   Asthma    Chronic heart failure with preserved ejection fraction (HFpEF) ([place])    a. 04/2021 Echo: EF 60-65% no rwma, nl RV fxn, RVSP 26.39mHg, mild MR.   CKD (chronic kidney disease), stage III (HCC)    COPD (chronic obstructive pulmonary disease) (HCC)    Diabetes mellitus without complication ([facility]    Encounter for colonoscopy due to history of adenomatous colonic polyps    GERD (gastroesophageal reflux disease)    Hearing loss of both ears    Hyperlipidemia    Osteoporosis    PAF (paroxysmal atrial fibrillation) ([facility]    a. 12/2021 Zio: Predominantly sinus @ 72. 1% afib burden @ 85-176 bpm. Longest 2h 478m 120. Frequent PACs (6.2%); b. CHA2DS2VASc = 6.   Palpitations    PSVT (paroxysmal supraventricular tachycardia)    a. 05/2018 Zio: 99 SVT runs. Fastest 218 x 4:30. Longest 4:38 w/ rate of 172.   Sleep apnea    resolved with gastric bypass   Syncope and collapse    Urine incontinence    Past Surgical History:  Procedure Laterality Date   BROW LIFT Bilateral 06/27/2015   Procedure: BLEPHAROPLASTY BILATERAL UPPER EYELIDS BILATERAL BLEPHAROTOSIS EYELIDS;  Surgeon: AmKarle StarchMD;  [place] [facility];  Laterality: Bilateral;  BILATERAL   CATARACT EXTRACTION W/PHACO Right 02/13/2015   Procedure: CATARACT EXTRACTION PHACO AND INTRAOCULAR LENS PLACEMENT (IOC);  Surgeon: AnRonnell FreshwaterMD;  [place] [facility];  Laterality: Right;   CATARACT EXTRACTION W/PHACO Left 03/22/2015   Procedure: CATARACT EXTRACTION PHACO AND INTRAOCULAR LENS PLACEMENT (IOC);  Surgeon: AnRonnell FreshwaterMD;  [facility];  Laterality: Left;  TORIC   CHOLECYSTECTOMY  1984   COLECTOMY     COSMETIC SURGERY  2012   tummy tuck and excess skin removal   GASTRIC BYPASS  2010   REPLACEMENT TOTAL KNEE BILATERAL  1998   SHOULDER SURGERY  2009   left    TONSILLECTOMY     TOTAL ABDOMINAL HYSTERECTOMY  1979    Allergies  Allergies  Allergen Reactions   Azithromycin Other (See Comments)    Causes stomach burning pains   Hydromorphone Other (See Comments)    confusion, personality change   Morphine Other (See Comments)    Goes crazy   Nsaids Other (See Comments)    Avoids because of gastric bypass surgery   Oxycodone-Acetaminophen Nausea And Vomiting   Codeine Other (See Comments) and Nausea And Vomiting    "will not stay down"   Nabumetone Rash   Oxycodone Nausea And Vomiting   Oxycodone-Acetaminophen Nausea And Vomiting   Promethazine Hcl Rash and Other (See Comments)    History of Present Illness    [id number] with above past medical history including chronic HFpEF, hyperlipidemia, type 2 diabetes mellitus, stage III chronic kidney disease, anxiety, COPD, syncope, and palpitations.  In May 2020, in the setting of palpitations, she underwent event monitoring which showed 99 runs of SVT lasting up to 4 minutes and 30 seconds at a maximal heart rate of 210 bpm.  She was initially placed on low-dose metoprolol therapy and this was subsequent transition to diltiazem.  In 04/2021, in she was admitted w/ AECOPD and narrow complex tachycardia - SVT vs sinus tachycardia.  She was placed on dilt gtt and PO bisoprolol and rates slowed, most consistent w/ sinus tachycardia.  She was maintained on bisoprolol  and in the setting of chronic lymphedema, an effort was made to avoid calcium channel blockers.  Echo during admission showed nl EF w/ RVSP of 26.27mHg and mild MR.  At office follow-up on 12/5, she c/o palpitations that her smart watch identified as Afib.  She subsequently wore a 2 wk zio, which showed a 1% burden of paroxysmal atrial fibrillation.  Patient was contacted and scheduled for today.  She notes that she only rarely notes palpitations with elevations in heart rates into the 140s at times.  With the exception of noticing elevated heart rates, she is otherwise asymptomatic.  She is more or less wheelchair/bedbound and does very limited ambulation secondary to chronic knee pain.  She has chronic, stable bilateral ankle and pedal edema in the setting of sitting for large portions of the day and an inability to use compression socks noting that they always rolled down.  She does not experience chest pain and denies dyspnea, PND, orthopnea, dizziness, syncope, or early satiety.  Home Medications    Current Outpatient Medications  Medication Sig Dispense Refill   albuterol (PROVENTIL) (2.5 MG/3ML) 0.083% nebulizer solution Take 3 mLs (2.5 mg total) by nebulization every 6 (six) hours as needed for wheezing or shortness of breath. 360 mL 0   albuterol (VENTOLIN HFA) 108 (90 Base) MCG/ACT inhaler Inhale 2 puffs into the lungs every 6 (six) hours as needed for wheezing or shortness of breath. 1 each 3   alendronate (FOSAMAX) 70 MG tablet Take 1 tablet (70 mg total) by mouth once a week. Take with a full glass of water on an empty stomach. 12 tablet 3   Ascorbic Acid (VITAMIN C) 1000 MG tablet Take 500 mg by mouth daily.      atorvastatin (LIPITOR) 20 MG tablet Take 1 tablet (20 mg total) by mouth at bedtime. 90 tablet 3   busPIRone (BUSPAR) 5 MG tablet Take 1 tablet (5 mg total) by mouth 3 (three) times daily as needed (anxiety). 90 tablet 1   Cholecalciferol 25 MCG (1000 UT) tablet Take 1,000 Units  by mouth daily.      diltiazem (CARDIZEM CD) 240 MG 24 hr capsule Take 1 capsule (240 mg total) by mouth daily. 90 capsule 3   famotidine (PEPCID) 20 MG tablet Take 20 mg by mouth 2 (two) times daily.     ferrous sulfate 325 (65 FE) MG EC tablet Take 325 mg by mouth at bedtime.      furosemide (LASIX) 20 MG tablet Take 1 tablet (20 mg total) by mouth daily as needed for fluid or edema. Take lasix daily, with extra dose as needed for swelling 90 tablet 3   gabapentin (NEURONTIN) 300 MG capsule Take 1 capsule (300 mg total) by mouth at bedtime. 90 capsule 3   ipratropium-albuterol (DUONEB) 0.5-2.5 (3) MG/3ML SOLN Take 3 mLs by nebulization every 4 (four) hours as needed. 720 mL 5   methocarbamol (ROBAXIN) 500 MG tablet Take 1  tablet (500 mg total) by mouth every 8 (eight) hours as needed for muscle spasms. 90 tablet 3   Multiple Vitamins-Minerals (ONE-A-DAY WOMENS PETITES) TABS Take 2 tablets by mouth every morning.     pantoprazole (PROTONIX) 40 MG tablet Take 1 tablet (40 mg total) by mouth daily. 30 tablet 3   sertraline (ZOLOFT) 100 MG tablet Take 1 tablet (100 mg total) by mouth daily. 90 tablet 3   SUPER B COMPLEX/C PO Take 1 tablet by mouth daily.     traZODone (DESYREL) 100 MG tablet Take 1 tablet (100 mg total) by mouth at bedtime. 90 tablet 3   TRELEGY ELLIPTA 100-62.5-25 MCG/INH AEPB Inhale 1 puff into the lungs daily.     apixaban (ELIQUIS) 5 MG TABS tablet Take 1 tablet (5 mg total) by mouth 2 (two) times daily. 60 tablet 6   Magnesium 400 MG CAPS Take 400 mg by mouth daily.     potassium chloride SA (KLOR-CON M) 20 MEQ tablet Take 2 tablets (40 mEq total) by mouth 2 (two) times daily. 120 tablet 3   No current facility-administered medications for this visit.     Review of Systems    Chronic, stable pedal and ankle swelling.  Palpitations as outlined above.  She denies chest pain, dyspnea, PND, orthopnea, dizziness, syncope, or early satiety.  All other systems reviewed and are  otherwise negative except as noted above.    Physical Exam    VS:  BP 120/70 (BP Location: Right Wrist, Patient Position: Sitting, Cuff Size: Normal)   Pulse 83   Ht '5\' 1"'$  (1.549 m)   Wt 200 lb 8 oz (90.9 kg)   SpO2 96%   BMI 37.88 kg/m  , BMI Body mass index is 37.88 kg/m.     GEN: Well nourished, well developed, in no acute distress. HEENT: normal. Neck: Supple, no JVD, carotid bruits, or masses. Cardiac: RRR, no murmurs, rubs, or gallops. No clubbing, cyanosis, 2+ bilateral ankle edema.  Radials 2+/PT 1+ and equal bilaterally.  Respiratory:  Respirations regular and unlabored, clear to auscultation bilaterally. GI: Obese, soft, nontender, nondistended, BS + x 4. MS: no deformity or atrophy. Skin: warm and dry, no rash. Neuro:  Strength and sensation are intact. Psych: Normal affect.  Accessory Clinical Findings    ECG personally reviewed by me today -regular sinus rhythm, 83, PACs, mild lateral ST depression and T wave abnormalities- no acute changes.  Lab Results  Component Value Date   WBC 7.4 02/01/2022   HGB 13.0 02/01/2022   HCT 38.9 02/01/2022   MCV 94.4 02/01/2022   PLT 206 02/01/2022   Lab Results  Component Value Date   CREATININE 1.00 02/01/2022   BUN 13 02/01/2022   NA 140 02/01/2022   K 2.9 (L) 02/01/2022   CL 101 02/01/2022   CO2 30 02/01/2022   Lab Results  Component Value Date   ALT 23 11/01/2021   AST 26 11/01/2021   ALKPHOS 58 11/01/2021   BILITOT 0.6 11/01/2021   Lab Results  Component Value Date   CHOL 167 02/05/2021   HDL 72 02/05/2021   LDLCALC 69 02/05/2021   TRIG 182 (H) 02/05/2021   CHOLHDL 2.3 02/05/2021    Lab Results  Component Value Date   HGBA1C 7.2 (A) 07/11/2021   Lab Results  Component Value Date   TSH 3.019 02/01/2022     Assessment & Plan    1.  Paroxysmal atrial fibrillation: Patient recently seen in clinic in December secondary  to palpitations with subsequent monitoring showing a 1% PAF burden.  On 1  particular day, she had a 12% burden.  She is symptomatic when in atrial fibrillation and has noted occasional palpitations with rates into the 140s.  Other than noticing elevated rates, she does not experience chest pain, dyspnea, or presyncope.  CHA2DS2-VASc equals 6.  We discussed the diagnosis of atrial fibrillation today as well as her stroke risk.  She had already done some research on her own.  All questions answered.  I did follow-up lab work today including a CBC, basic metabolic panel, TSH, and magnesium.  K is 2.9 with magnesium 1.5 and I have sent in supplementation for both and she has been contacted by our nursing staff.  I am increasing diltiazem to 240 mg daily and of asked her to be on the look out for worsening lower extremity swelling which if it occurs, we may need to consider cutting back down and adding a beta-blocker.  I will also add Eliquis 5 mg twice daily.  2.  Chronic HFpEF: Chronic, stable ankle edema which is largely dependent in nature and not as significant in the a.m.  She does sit for large portions of the day.  Her weight is relatively stable.  Heart rate and blood pressure stable.  Increasing diltiazem in the setting of PAF.  Will watch closely for worsening swelling.  Continue current dose of Lasix.  3.  Hyperlipidemia: She remains on atorvastatin therapy with an LDL of 69 last year.  4.  Stage III chronic kidney disease: Creatinine stable at 1.0 today.  5.  Type 2 diabetes mellitus: A1c 7.2 in June.  She tells me this is being diet controlled.  6.  Hypomagnesemia/hypokalemia: Magnesium 1.5 with potassium of 2.9.  She will start taking Mag-Ox 40 mg daily and potassium chloride 40 mill equivalents twice daily.  Plan to follow-up basic metabolic panel and magnesium in 1 week.  7.  Disposition: Follow-up basic metabolic panel and magnesium in 1 week.  Follow-up in clinic in 4 to 6 weeks or sooner if necessary.  Murray Hodgkins, NP 02/01/2022, 5:45 PM

## 2022-02-12 ENCOUNTER — Other Ambulatory Visit
Admission: RE | Admit: 2022-02-12 | Discharge: 2022-02-12 | Disposition: A | Discharge status: Home / Self Care | Payer: Medicare Other | Attending: Nurse Practitioner | Admitting: Nurse Practitioner

## 2022-02-12 DIAGNOSIS — E876 Hypokalemia: Secondary | ICD-10-CM | POA: Insufficient documentation

## 2022-02-12 DIAGNOSIS — I5032 Chronic diastolic (congestive) heart failure: Secondary | ICD-10-CM | POA: Insufficient documentation

## 2022-02-12 LAB — BASIC METABOLIC PANEL
Anion gap: 8 (ref 5–15)
BUN: 16 mg/dL (ref 8–23)
CO2: 27 mmol/L (ref 22–32)
Calcium: 9.1 mg/dL (ref 8.9–10.3)
Chloride: 104 mmol/L (ref 98–111)
Creatinine, Ser: 1.2 mg/dL — ABNORMAL HIGH (ref 0.44–1.00)
GFR, Estimated: 45 mL/min — ABNORMAL LOW (ref >60)
Glucose, Bld: 187 mg/dL — ABNORMAL HIGH (ref 70–99)
Potassium: 4.3 mmol/L (ref 3.5–5.1)
Sodium: 139 mmol/L (ref 135–145)

## 2022-02-12 LAB — MAGNESIUM: Magnesium: 1.8 mg/dL (ref 1.7–2.4)

## 2022-02-18 NOTE — Progress Notes (Unsigned)
Patient ID: Jenna [name], female    DOB: [birth year], 87 y.o.   MRN: [id number]  HPI  Ms Macari is a 87 y/o female with a history of asthma, DM, hyperlipidemia, CKD, anxiety, GERD, COPD, PSVT, sleep apnea, tobacco use and chronic heart failure.   Echo report from 05/18/21 showed an EF of 60-65% along with mild MR and normal PA Pressure of 26.3 mmHg.   Admitted 12/02/21 due to COPD exacerbation. Given steroids. CXR negative for pneumonia. Needed oxygen and unable to be weaned off. 1 dose of IV lasix given due to worsening edema. PT evaluation done. Discharged after 5 days with steroids and antibiotics. Was in the ED 11/02/21 due to COPD exacerbation where she was treated and released.    She presents today for a follow-up visit with a chief complaint of moderate fatigue with minimal exertion. Describes this as chronic in nature. Has associated SOB, pedal edema (improving), palpitations & light-headedness along with this. She denies any difficulty sleeping, abdominal distention, chest pain, wheezing, cough or weight gain.   She has not taken her medications yet today (almost 1:45pm) but plans to do so upon her return home.   Past Medical History:  Diagnosis Date   Anxiety    Arthritis    neck, knees(before replacements)   Asthma    Chronic heart failure with preserved ejection fraction (HFpEF) ([place])    a. 04/2021 Echo: EF 60-65% no rwma, nl RV fxn, RVSP 26.40mHg, mild MR.   CKD (chronic kidney disease), stage III (HCC)    COPD (chronic obstructive pulmonary disease) (HCC)    Di[place]    Encounter for colonoscopy due to history of adenomatous colonic polyps    GERD (gastroesophageal reflux disease)    Hearing loss of both ears    Hyperlipidemia    Osteoporosis    PAF (paroxysmal atrial fibrillation) ([facility]    a. 12/2021 Zio: Predominantly sinus @ 72. 1% afib burden @ 85-176 bpm. Longest 2h 463m 120. Frequent PACs (6.2%); b. CHA2DS2VASc = 6.    Palpitations    PSVT (paroxysmal supraventricular tachycardia)    a. 05/2018 Zio: 99 SVT runs. Fastest 218 x 4:30. Longest 4:38 w/ rate of 172.   Sleep apnea    resolved with gastric bypass   Syncope and collapse    Urine incontinence    Past Surgical History:  Procedure Laterality Date   BROW LIFT Bilateral 06/27/2015   Procedure: BLEPHAROPLASTY BILATERAL UPPER EYELIDS BILATERAL BLEPHAROTOSIS EYELIDS;  Surgeon: AmKarle StarchMD;  [place] [facility];  Laterality: Bilateral;  BILATERAL   CATARACT EXTRACTION W/PHACO Right 02/13/2015   Procedure: CATARACT EXTRACTION PHACO AND INTRAOCULAR LENS PLACEMENT (IOC);  Surgeon: AnRonnell FreshwaterMD;  [place] [facility] [facility];  Laterality: Right;   CATARACT EXTRACTION W/PHACO Left 03/22/2015   Procedure: CATARACT EXTRACTION PHACO AND INTRAOCULAR LENS PLACEMENT (IOC);  Surgeon: AnRonnell FreshwaterMD;  [place] [facility] [facility];  Laterality: Left;  TORIC   CHOLECYSTECTOMY  1984   COLECTOMY     COSMETIC SURGERY  2012   tummy tuck and excess skin removal   GASTRIC BYPASS  2010   REPLACEMENT TOTAL KNEE BILATERAL  1998   SHOULDER SURGERY  2009   left    TONSILLECTOMY     TOTAL ABDOMINAL HYSTERECTOMY  1979   Family History  Problem Relation Age of Onset   Heart attack Mother    Hypertension Mother  Diabetes type II Mother    Pneumonia Father    Skin cancer Father    Diabetes type II Father    Colon cancer Sister    Ovarian cancer Sister    Heart attack Brother    Heart attack Sister 34   Hyperlipidemia Sister    Hypertension Sister    Social History   Tobacco Use   Smoking status: Former    Packs/day: 1.00    Years: 20.00    Total pack years: 20.00    Types: Cigarettes    Quit date: 07/1981    Years since quitting: 40.6   Smokeless tobacco: Former  Substance Use Topics   Alcohol use: Not Currently    Comment: past   Allergies   Allergen Reactions   Azithromycin Other (See Comments)    Causes stomach burning pains   Hydromorphone Other (See Comments)    confusion, personality change   Morphine Other (See Comments)    Goes crazy   Nsaids Other (See Comments)    Avoids because of gastric bypass surgery   Oxycodone-Acetaminophen Nausea And Vomiting   Codeine Other (See Comments) and Nausea And Vomiting    "will not stay down"   Nabumetone Rash   Oxycodone Nausea And Vomiting   Oxycodone-Acetaminophen Nausea And Vomiting   Promethazine Hcl Rash and Other (See Comments)   Prior to Admission medications   Medication Sig Start Date End Date Taking? Authorizing Provider  albuterol (PROVENTIL) (2.5 MG/3ML) 0.083% nebulizer solution Take 3 mLs (2.5 mg total) by nebulization every 6 (six) hours as needed for wheezing or shortness of breath. 05/21/21 05/21/22 Yes Wieting, Richard, MD  albuterol (VENTOLIN HFA) 108 (90 Base) MCG/ACT inhaler Inhale 2 puffs into the lungs every 6 (six) hours as needed for wheezing or shortness of breath. 12/11/21 03/11/22 Yes Karamalegos, Devonne Doughty, DO  alendronate (FOSAMAX) 70 MG tablet Take 1 tablet (70 mg total) by mouth once a week. Take with a full glass of water on an empty stomach. 07/11/21  Yes Karamalegos, Devonne Doughty, DO  empagliflozin (JARDIANCE) 10 MG TABS tablet Take 1 tablet (10 mg total) by mouth daily before breakfast. 02/19/22  Yes Pratt Bress A, FNP  TRELEGY ELLIPTA 100-62.5-25 MCG/INH AEPB Inhale 1 puff into the lungs daily. 07/31/20  Yes [provider]  apixaban (ELIQUIS) 5 MG TABS tablet Take 1 tablet (5 mg total) by mouth 2 (two) times daily. 02/01/22   Theora Gianotti, NP  Ascorbic Acid (VITAMIN C) 1000 MG tablet Take 500 mg by mouth daily.     [provider]  atorvastatin (LIPITOR) 20 MG tablet Take 1 tablet (20 mg total) by mouth at bedtime. 12/31/21   Karamalegos, Devonne Doughty, DO  busPIRone (BUSPAR) 5 MG tablet Take 1 tablet (5 mg total) by  mouth 3 (three) times daily as needed (anxiety). 07/11/21   Karamalegos, Devonne Doughty, DO  Cholecalciferol 25 MCG (1000 UT) tablet Take 1,000 Units by mouth daily.     [provider]  diltiazem (CARDIZEM CD) 240 MG 24 hr capsule Take 1 capsule (240 mg total) by mouth daily. 02/01/22   Theora Gianotti, NP  famotidine (PEPCID) 20 MG tablet Take 20 mg by mouth 2 (two) times daily.    [provider]  ferrous sulfate 325 (65 FE) MG EC tablet Take 325 mg by mouth at bedtime.     [provider]  furosemide (LASIX) 20 MG tablet Take 1 tablet (20 mg total) by mouth daily as needed  for fluid or edema. Take lasix daily, with extra dose as needed for swelling 11/21/21   Karamalegos, Devonne Doughty, DO  gabapentin (NEURONTIN) 300 MG capsule Take 1 capsule (300 mg total) by mouth at bedtime. 05/31/21   Karamalegos, Devonne Doughty, DO  ipratropium-albuterol (DUONEB) 0.5-2.5 (3) MG/3ML SOLN Take 3 mLs by nebulization every 4 (four) hours as needed. Patient not taking: Reported on 02/19/2022 11/09/21   Olin Hauser, DO  Magnesium 400 MG CAPS Take 400 mg by mouth daily. 02/01/22   Theora Gianotti, NP  methocarbamol (ROBAXIN) 500 MG tablet Take 1 tablet (500 mg total) by mouth every 8 (eight) hours as needed for muscle spasms. 04/06/21   Karamalegos, Devonne Doughty, DO  Multiple Vitamins-Minerals (ONE-A-DAY WOMENS PETITES) TABS Take 2 tablets by mouth every morning.    [provider]  pantoprazole (PROTONIX) 40 MG tablet Take 1 tablet (40 mg total) by mouth daily. 12/21/21   Karamalegos, Devonne Doughty, DO  potassium chloride SA (KLOR-CON M) 20 MEQ tablet Take 2 tablets (40 mEq total) by mouth 2 (two) times daily. 02/01/22   Theora Gianotti, NP  sertraline (ZOLOFT) 100 MG tablet Take 1 tablet (100 mg total) by mouth daily. 05/25/21   Karamalegos, Devonne Doughty, DO  SUPER B COMPLEX/C PO Take 1 tablet by mouth daily.    [provider]  traZODone (DESYREL)  100 MG tablet Take 1 tablet (100 mg total) by mouth at bedtime. 02/12/21   Olin Hauser, DO   Review of Systems  Constitutional:  Positive for fatigue (easily). Negative for appetite change.  HENT:  Negative for congestion, postnasal drip and sore throat.   Eyes: Negative.   Respiratory:  Positive for shortness of breath. Negative for cough, chest tightness and wheezing.   Cardiovascular:  Positive for palpitations and leg swelling ("improving"). Negative for chest pain.  Gastrointestinal:  Negative for abdominal distention and abdominal pain.  Endocrine: Negative.   Genitourinary: Negative.   Musculoskeletal:  Negative for back pain and neck pain.  Skin: Negative.   Allergic/Immunologic: Negative.   Neurological:  Positive for light-headedness. Negative for dizziness.  Hematological:  Negative for adenopathy. Does not bruise/bleed easily.  Psychiatric/Behavioral:  Negative for dysphoric mood and sleep disturbance (sleeps in lift chair almost flat). The patient is not nervous/anxious.    Vitals:   02/19/22 1331  BP: (!) 150/55  Pulse: 82  Resp: 18  SpO2: 97%  Weight: 193 lb (87.5 kg)   Wt Readings from Last 3 Encounters:  02/19/22 193 lb (87.5 kg)  02/01/22 200 lb 8 oz (90.9 kg)  12/25/21 197 lb 2 oz (89.4 kg)   Lab Results  Component Value Date   CREATININE 1.20 (H) 02/12/2022   CREATININE 1.00 02/01/2022   CREATININE 1.29 (H) 12/07/2021   Physical Exam Vitals and nursing note reviewed.  Constitutional:      Appearance: Normal appearance.  HENT:     Head: Normocephalic and atraumatic.  Cardiovascular:     Rate and Rhythm: Normal rate. Rhythm irregular.  Pulmonary:     Effort: Pulmonary effort is normal. No respiratory distress.     Breath sounds: No wheezing or rales.  Abdominal:     General: There is no distension.     Palpations: Abdomen is soft.     Tenderness: There is no abdominal tenderness.  Musculoskeletal:        General: No tenderness.      Cervical back: Normal range of motion and neck supple.  Right lower leg: No edema.     Left lower leg: No edema.  Skin:    General: Skin is warm and dry.  Neurological:     General: No focal deficit present.     Mental Status: She is alert and oriented to person, place, and time.  Psychiatric:        Mood and Affect: Mood normal.        Behavior: Behavior normal.        Thought Content: Thought content normal.    Assessment & Plan:  1: Chronic heart failure with preserved ejection fraction without structural changes- - NYHA class III - euvolemic today - weighing daily; reminded to call for an overnight weight gain of > 2 pounds or a weekly weight gain of > 5 pounds - weight down 3 pounds from last visit here 2 months ago - will add jardiance '10mg'$  daily; voucher provided and 2 weeks samples also given - BMP next visit and consider making furosemide PRN at next visit - not adding salt and she tries to review food labels for sodium content - BNP 12/02/21 was 75.6 - PharmD reconciled medications with the patient - has received her flu vaccine for this season  2: DM with CKD- - saw PCP Parks Ranger) 12/11/21 - BMP 02/12/22 reviewed and showed sodium 139, potassium 4.3, creatinine 1.2 & GFR 45 - A1c 07/11/21 was 7.2%  3: COPD- - saw pulmonology Raul Del) 12/18/21  4: Paroxsymal SVT- - saw cardiology Sharolyn Douglas) 02/01/22 - currently on diltiazem   Medication list reviewed.   Return in 3 weeks, sooner if needed.

## 2022-02-19 ENCOUNTER — Encounter: Payer: Self-pay | Admitting: Family

## 2022-02-19 ENCOUNTER — Encounter: Payer: Self-pay | Admitting: Pharmacy Technician

## 2022-02-19 ENCOUNTER — Other Ambulatory Visit (HOSPITAL_COMMUNITY): Payer: Self-pay

## 2022-02-19 ENCOUNTER — Ambulatory Visit: Payer: Medicare Other | Attending: Family | Admitting: Family

## 2022-02-19 VITALS — BP 150/55 | HR 82 | Resp 18 | Wt 193.0 lb

## 2022-02-19 DIAGNOSIS — G4730 Sleep apnea, unspecified: Secondary | ICD-10-CM | POA: Diagnosis not present

## 2022-02-19 DIAGNOSIS — F419 Anxiety disorder, unspecified: Secondary | ICD-10-CM | POA: Insufficient documentation

## 2022-02-19 DIAGNOSIS — J449 Chronic obstructive pulmonary disease, unspecified: Secondary | ICD-10-CM | POA: Diagnosis not present

## 2022-02-19 DIAGNOSIS — I5032 Chronic diastolic (congestive) heart failure: Secondary | ICD-10-CM | POA: Insufficient documentation

## 2022-02-19 DIAGNOSIS — E1122 Type 2 diabetes mellitus with diabetic chronic kidney disease: Secondary | ICD-10-CM

## 2022-02-19 DIAGNOSIS — K219 Gastro-esophageal reflux disease without esophagitis: Secondary | ICD-10-CM | POA: Diagnosis not present

## 2022-02-19 DIAGNOSIS — I471 Supraventricular tachycardia, unspecified: Secondary | ICD-10-CM | POA: Insufficient documentation

## 2022-02-19 DIAGNOSIS — E785 Hyperlipidemia, unspecified: Secondary | ICD-10-CM | POA: Diagnosis not present

## 2022-02-19 DIAGNOSIS — N1832 Chronic kidney disease, stage 3b: Secondary | ICD-10-CM | POA: Diagnosis not present

## 2022-02-19 DIAGNOSIS — J441 Chronic obstructive pulmonary disease with (acute) exacerbation: Secondary | ICD-10-CM | POA: Diagnosis not present

## 2022-02-19 MED ORDER — EMPAGLIFLOZIN 10 MG PO TABS: 10.0000 mg | ORAL_TABLET | Freq: Every day | ORAL | 5 refills | Status: DC

## 2022-02-19 NOTE — Patient Instructions (Addendum)
Continue weighing daily and call for an overnight weight gain of 3 pounds or more or a weekly weight gain of more than 5 pounds.  If you have voicemail, please make sure your mailbox is cleaned out so that we may leave a message and please make sure to listen to any voicemails.   If you receive a satisfaction survey regarding the [facility], please take the time to fill it out. This way we can continue to provide excellent care and make any changes that need to be made.    Start taking jardiance as 1 tablet every morning beginning tomorrow

## 2022-02-19 NOTE — Progress Notes (Signed)
[facility] - PHARMACIST COUNSELING NOTE  Guideline-Directed Medical Therapy/Evidence Based Medicine  ACE/ARB/ARNI:  None Beta Blocker:  None Aldosterone Antagonist:  None Diuretic: Furosemide 20 mg daily SGLT2i:  None  Adherence Assessment  Do you ever forget to take your medication? '[]'$ Yes '[x]'$ No  Do you ever skip doses due to side effects? '[]'$ Yes '[x]'$ No  Do you have trouble affording your medicines? '[]'$ Yes '[x]'$ No  Are you ever unable to pick up your medication due to transportation difficulties? '[]'$ Yes '[x]'$ No  Do you ever stop taking your medications because you don't believe they are helping? '[]'$ Yes '[x]'$ No  Do you check your weight daily? '[x]'$ Yes '[]'$ No   Adherence strategy: Pill box  Barriers to obtaining medications: None  Vital signs: HR 82, BP 150/55, weight (pounds) 193 ECHO: Date 04/2021, EF 60-65%, notes Mild MR and normal PA pressure     Latest Ref Rng & Units 02/12/2022    1:32 PM 02/01/2022    3:47 PM 12/07/2021   11:27 AM  BMP  Glucose 70 - 99 mg/dL 187  184  99   BUN 8 - 23 mg/dL 16  13  40   Creatinine 0.44 - 1.00 mg/dL 1.20  1.00  1.29   Sodium 135 - 145 mmol/L 139  140  139   Potassium 3.5 - 5.1 mmol/L 4.3  2.9  4.4   Chloride 98 - 111 mmol/L 104  101  101   CO2 22 - 32 mmol/L '27  30  29   '$ Calcium 8.9 - 10.3 mg/dL 9.1  8.8  8.9     Past Medical History:  Diagnosis Date   Anxiety    Arthritis    neck, knees(before replacements)   Asthma    Chronic heart failure with preserved ejection fraction (HFpEF) ([place])    a. 04/2021 Echo: EF 60-65% no rwma, nl RV fxn, RVSP 26.43mHg, mild MR.   CKD (chronic kidney disease), stage III (HCC)    COPD (chronic obstructive pulmonary disease) (HCC)    Diabetes mellitus without complication ([place]    Encounter for colonoscopy due to history of adenomatous colonic polyps    GERD (gastroesophageal reflux disease)    Hearing loss of both ears    Hyperlipidemia    Osteoporosis    PAF  (paroxysmal atrial fibrillation) ([facility]    a. 12/2021 Zio: Predominantly sinus @ 72. 1% afib burden @ 85-176 bpm. Longest 2h 462m 120. Frequent PACs (6.2%); b. CHA2DS2VASc = 6.   Palpitations    PSVT (paroxysmal supraventricular tachycardia)    a. 05/2018 Zio: 99 SVT runs. Fastest 218 x 4:30. Longest 4:38 w/ rate of 172.   Sleep apnea    resolved with gastric bypass   Syncope and collapse    Urine incontinence     ASSESSMENT [age]ear old female with PMH pAF, T2DM, CKD3a, HLD, COPD who presents to the HF clinic for follow-up. Most recent ECHO in 04/2021 shows EF 60-65%. Patient not currently on any GDMT but takes furosemide 20 mg daily along with potassium chloride 40 mEq twice daily. Patient's daughter prepares her pill packs and is not at appointment today and patient not a reliable historian of meds, so called pharmacy to obtain med list and reconcile meds. While patient has CKD, eGFR 45 which is significantly above the eGFR 20 cutoff for Jardiance.  Recent ED Visit (past 6 months): Date - 11/2021, CC - COPD exacerbation Date - 10/2021, CC - COPD exacerbation Date - 10/2021, CC -  Dyspnea  PLAN  CHF/HTN Recommend initiating Jardiance 10 mg daily Follow-up BMP in 3-4 weeks Provided patient with 2 weeks of samples and 14-day free coupon and counseled patient on Jardiance Continue furosemide and potassium  HLD 01/2021 LDL 69 Continue atorvastatin   Time spent: 20 minutes  Delena Bali, Pharm.D. Clinical Pharmacist 02/19/2022 7:46 PM    Current Outpatient Medications:    albuterol (PROVENTIL) (2.5 MG/3ML) 0.083% nebulizer solution, Take 3 mLs (2.5 mg total) by nebulization every 6 (six) hours as needed for wheezing or shortness of breath., Disp: 360 mL, Rfl: 0   albuterol (VENTOLIN HFA) 108 (90 Base) MCG/ACT inhaler, Inhale 2 puffs into the lungs every 6 (six) hours as needed for wheezing or shortness of breath., Disp: 1 each, Rfl: 3   alendronate (FOSAMAX) 70 MG tablet, Take  1 tablet (70 mg total) by mouth once a week. Take with a full glass of water on an empty stomach., Disp: 12 tablet, Rfl: 3   apixaban (ELIQUIS) 5 MG TABS tablet, Take 1 tablet (5 mg total) by mouth 2 (two) times daily., Disp: 60 tablet, Rfl: 6   Ascorbic Acid (VITAMIN C) 1000 MG tablet, Take 500 mg by mouth daily. , Disp: , Rfl:    atorvastatin (LIPITOR) 20 MG tablet, Take 1 tablet (20 mg total) by mouth at bedtime., Disp: 90 tablet, Rfl: 3   busPIRone (BUSPAR) 5 MG tablet, Take 1 tablet (5 mg total) by mouth 3 (three) times daily as needed (anxiety)., Disp: 90 tablet, Rfl: 1   Cholecalciferol 25 MCG (1000 UT) tablet, Take 1,000 Units by mouth daily. , Disp: , Rfl:    diltiazem (CARDIZEM CD) 240 MG 24 hr capsule, Take 1 capsule (240 mg total) by mouth daily., Disp: 90 capsule, Rfl: 3   empagliflozin (JARDIANCE) 10 MG TABS tablet, Take 1 tablet (10 mg total) by mouth daily before breakfast., Disp: 30 tablet, Rfl: 5   famotidine (PEPCID) 20 MG tablet, Take 20 mg by mouth 2 (two) times daily., Disp: , Rfl:    ferrous sulfate 325 (65 FE) MG EC tablet, Take 325 mg by mouth at bedtime. , Disp: , Rfl:    furosemide (LASIX) 20 MG tablet, Take 1 tablet (20 mg total) by mouth daily as needed for fluid or edema. Take lasix daily, with extra dose as needed for swelling, Disp: 90 tablet, Rfl: 3   gabapentin (NEURONTIN) 300 MG capsule, Take 1 capsule (300 mg total) by mouth at bedtime., Disp: 90 capsule, Rfl: 3   ipratropium-albuterol (DUONEB) 0.5-2.5 (3) MG/3ML SOLN, Take 3 mLs by nebulization every 4 (four) hours as needed. (Patient not taking: Reported on 02/19/2022), Disp: 720 mL, Rfl: 5   Magnesium 400 MG CAPS, Take 400 mg by mouth daily., Disp: , Rfl:    methocarbamol (ROBAXIN) 500 MG tablet, Take 1 tablet (500 mg total) by mouth every 8 (eight) hours as needed for muscle spasms., Disp: 90 tablet, Rfl: 3   Multiple Vitamins-Minerals (ONE-A-DAY WOMENS PETITES) TABS, Take 2 tablets by mouth every morning., Disp:  , Rfl:    pantoprazole (PROTONIX) 40 MG tablet, Take 1 tablet (40 mg total) by mouth daily., Disp: 30 tablet, Rfl: 3   potassium chloride SA (KLOR-CON M) 20 MEQ tablet, Take 2 tablets (40 mEq total) by mouth 2 (two) times daily., Disp: 120 tablet, Rfl: 3   sertraline (ZOLOFT) 100 MG tablet, Take 1 tablet (100 mg total) by mouth daily., Disp: 90 tablet, Rfl: 3   SUPER B COMPLEX/C PO,  Take 1 tablet by mouth daily., Disp: , Rfl:    traZODone (DESYREL) 100 MG tablet, Take 1 tablet (100 mg total) by mouth at bedtime., Disp: 90 tablet, Rfl: 3   TRELEGY ELLIPTA 100-62.5-25 MCG/INH AEPB, Inhale 1 puff into the lungs daily., Disp: , Rfl:    DRUGS TO CAUTION IN HEART FAILURE  Drug or Class Mechanism  Analgesics NSAIDs COX-2 inhibitors Glucocorticoids  Sodium and water retention, increased systemic vascular resistance, decreased response to diuretics   Diabetes Medications Metformin Thiazolidinediones Rosiglitazone (Avandia) Pioglitazone (Actos) DPP4 Inhibitors Saxagliptin (Onglyza) Sitagliptin (Januvia)   Lactic acidosis Possible calcium channel blockade   Unknown  Antiarrhythmics Class I  Flecainide Disopyramide Class III Sotalol Other Dronedarone  Negative inotrope, proarrhythmic   Proarrhythmic, beta blockade  Negative inotrope  Antihypertensives Alpha Blockers Doxazosin Calcium Channel Blockers Diltiazem Verapamil Nifedipine Central Alpha Adrenergics Moxonidine Peripheral Vasodilators Minoxidil  Increases renin and aldosterone  Negative inotrope    Possible sympathetic withdrawal  Unknown  Anti-infective Itraconazole Amphotericin B  Negative inotrope Unknown  Hematologic Anagrelide Cilostazol   Possible inhibition of PD IV Inhibition of PD III causing arrhythmias  Neurologic/Psychiatric Stimulants Anti-Seizure  Drugs Carbamazepine Pregabalin Antidepressants Tricyclics Citalopram Parkinsons Bromocriptine Pergolide Pramipexole Antipsychotics Clozapine Antimigraine Ergotamine Methysergide Appetite suppressants Bipolar Lithium  Peripheral alpha and beta agonist activity  Negative inotrope and chronotrope Calcium channel blockade  Negative inotrope, proarrhythmic Dose-dependent QT prolongation  Excessive serotonin activity/valvular damage Excessive serotonin activity/valvular damage Unknown  IgE mediated hypersensitivy, calcium channel blockade  Excessive serotonin activity/valvular damage Excessive serotonin activity/valvular damage Valvular damage  Direct myofibrillar degeneration, adrenergic stimulation  Antimalarials Chloroquine Hydroxychloroquine Intracellular inhibition of lysosomal enzymes  Urologic Agents Alpha Blockers Doxazosin Prazosin Tamsulosin Terazosin  Increased renin and aldosterone  Adapted from Page Carleene Overlie, et al. "Drugs That May Cause or Exacerbate Heart Failure: A Scientific Statement from the American Heart  Association." Circulation 2016; 134:e32-e69. DOI: 10.1161/CIR.0000000000000426   MEDICATION ADHERENCES TIPS AND STRATEGIES Taking medication as prescribed improves patient outcomes in heart failure (reduces hospitalizations, improves symptoms, increases survival) Side effects of medications can be managed by decreasing doses, switching agents, stopping drugs, or adding additional therapy. Please let someone in the [facility] know if you have having bothersome side effects so we can modify your regimen. Do not alter your medication regimen without talking to [place].  Medication reminders can help patients remember to take drugs on time. If you are missing or forgetting doses you can try linking behaviors, using pill boxes, or an electronic reminder like an alarm on your phone or an app. Some people can also get automated phone calls as medication  reminders.

## 2022-02-21 DIAGNOSIS — J449 Chronic obstructive pulmonary disease, unspecified: Secondary | ICD-10-CM | POA: Diagnosis not present

## 2022-02-22 ENCOUNTER — Other Ambulatory Visit: Payer: Self-pay | Admitting: Family Medicine

## 2022-02-22 DIAGNOSIS — F5104 Psychophysiologic insomnia: Secondary | ICD-10-CM

## 2022-02-22 MED ORDER — TRAZODONE HCL 100 MG PO TABS: 100.0000 mg | ORAL_TABLET | Freq: Every day | ORAL | 3 refills | Status: DC

## 2022-02-25 DIAGNOSIS — H35352 Cystoid macular degeneration, left eye: Secondary | ICD-10-CM | POA: Diagnosis not present

## 2022-02-25 DIAGNOSIS — H35371 Puckering of macula, right eye: Secondary | ICD-10-CM | POA: Diagnosis not present

## 2022-02-25 DIAGNOSIS — H35372 Puckering of macula, left eye: Secondary | ICD-10-CM | POA: Diagnosis not present

## 2022-02-25 DIAGNOSIS — Z961 Presence of intraocular lens: Secondary | ICD-10-CM | POA: Diagnosis not present

## 2022-03-11 NOTE — Progress Notes (Unsigned)
Date:  [id number]   ID:  Jenna [name], DOB [birth year], MRN [id number]  Patient Location:  [place] [id number]   Provider location:   [facility], [place] office  PCP:  Olin Hauser, DO  Cardiologist:  Patsy Baltimore   Chief Complaint  Patient presents with   PHQ-9 4 Week Follow-up    Patient c/o shortness of breath with little to no activity. Medications reviewed by the patient verbally.      History of Present Illness:    Jenna [name] is a 87 y.o. female past medical history of obesity,  Hypertension, hyperlipidemia,  GERD,  chronic cough,  gastric bypass surgery,  depression,   chronic leg edema  Type 2 diabetes hemoglobin A1c 6.2 COPD, hospitalizations for COPD exacerbation, followed by Dr. Raul Del Former smoker Stage III chronic kidney disease Stable chronic problem with mood, anxiety, secondary insomnia Periodic hospitalization for COPD exacerbation April 2022, February 23 presents for evaluation of pericardial effusion seen on CT scan,  Palpitations  Last seen in clinic by myself Dec 2023  Hospital 11/23, records reviewed COPD exacerbation. on steroid.  IV lasix, ABX, Bronchitis  Zio monitor for tachycardia at home was ordered Normal sinus rhythm with paroxysmal atrial fibrillation 1% burden, 12% burden on December 8 Not patient triggered  In follow-up today has appreciated rare episodes of tachycardia lasting several minutes at a time Most of the time does not appreciate significant tachypalpitations Feels she is tolerating diltiazem ER 240 mg daily and Eliquis 5 twice daily  Sedentary, no regular walking program, has chronic leg weakness  EKG personally reviewed by myself on todays visit Normal sinus rhythm with rate 91 bpm left axis deviation  Other past medical history reviewed hospital February 2023 for COPD Acute bronchitis with acute on chronic COPD  exacerbation Treated with steroids, nebulizers Received doxycycline 7 days  Stress at home, son with CAD, MI Daughter living at their house,   hospital for treatment of COPD exacerbation Normal echo 04/24/2020  Metoprolol reduced to 12.5 at discharge Steroids, ABX,   Prior monitor reviewed May 2020, showed normal sinus rhythm with 99 SVT runs lasting up to 4 minutes 30 secs, max rate of 210 bpm, and average rate 172 bpm.  Prior history of chronic cough. Her other big complaint didn't GERD symptoms. Several other family members have GERD as well. She has had chronic lower extremity edema, prior trauma to her left lower extremity. She takes Lasix 20 mg daily with no significant improvement     Past Medical History:  Diagnosis Date   Anxiety    Arthritis    neck, knees(before replacements)   Asthma    Chronic heart failure with preserved ejection fraction (HFpEF) ([place])    a. 04/2021 Echo: EF 60-65% no rwma, nl RV fxn, RVSP 26.59mHg, mild MR.   CKD (chronic kidney disease), stage III (HCC)    COPD (chronic obstructive pulmonary disease) (HCC)    Diabetes mellitus without complication ([place]    Encounter for colonoscopy due to history of adenomatous colonic polyps    GERD (gastroesophageal reflux disease)    Hearing loss of both ears    Hyperlipidemia    Osteoporosis    PAF (paroxysmal atrial fibrillation) ([place]    a. 12/2021 Zio: Predominantly sinus @ 72. 1% afib burden @ 85-176 bpm. Longest 2h 45m 120. Frequent PACs (6.2%); b. CHA2DS2VASc = 6.   Palpitations  PSVT (paroxysmal supraventricular tachycardia)    a. 05/2018 Zio: 99 SVT runs. Fastest 218 x 4:30. Longest 4:38 w/ rate of 172.   Sleep apnea    resolved with gastric bypass   Syncope and collapse    Urine incontinence    Past Surgical History:  Procedure Laterality Date   BROW LIFT Bilateral 06/27/2015   Procedure: BLEPHAROPLASTY BILATERAL UPPER EYELIDS BILATERAL BLEPHAROTOSIS EYELIDS;  Surgeon: Karle Starch, MD;   Location: [facility];  Service: Ophthalmology;  Laterality: Bilateral;  BILATERAL   CATARACT EXTRACTION W/PHACO Right 02/13/2015   Procedure: CATARACT EXTRACTION PHACO AND INTRAOCULAR LENS PLACEMENT (IOC);  Surgeon: Ronnell Freshwater, MD;  Location: [facility];  Service: Ophthalmology;  Laterality: Right;   CATARACT EXTRACTION W/PHACO Left 03/22/2015   Procedure: CATARACT EXTRACTION PHACO AND INTRAOCULAR LENS PLACEMENT (IOC);  Surgeon: Ronnell Freshwater, MD;  Location: [facility];  Service: Ophthalmology;  Laterality: Left;  [place]   COLECTOMY     COSMETIC SURGERY  2012   tummy tuck and excess skin removal   GASTRIC BYPASS  2010   REPLACEMENT TOTAL KNEE BILATERAL  1998   SHOULDER SURGERY  2009   left    TONSILLECTOMY     TOTAL ABDOMINAL HYSTERECTOMY  1979     Current Outpatient Medications on File Prior to Visit  Medication Sig Dispense Refill   albuterol (PROVENTIL) (2.5 MG/3ML) 0.083% nebulizer solution Take 3 mLs (2.5 mg total) by nebulization every 6 (six) hours as needed for wheezing or shortness of breath. 360 mL 0   albuterol (VENTOLIN HFA) 108 (90 Base) MCG/ACT inhaler Inhale 2 puffs into the lungs every 6 (six) hours as needed for wheezing or shortness of breath. 1 each 3   alendronate (FOSAMAX) 70 MG tablet Take 1 tablet (70 mg total) by mouth once a week. Take with a full glass of water on an empty stomach. 12 tablet 3   apixaban (ELIQUIS) 5 MG TABS tablet Take 1 tablet (5 mg total) by mouth 2 (two) times daily. 60 tablet 6   Ascorbic Acid (VITAMIN C) 1000 MG tablet Take 500 mg by mouth daily.      atorvastatin (LIPITOR) 20 MG tablet Take 1 tablet (20 mg total) by mouth at bedtime. 90 tablet 3   busPIRone (BUSPAR) 5 MG tablet Take 1 tablet (5 mg total) by mouth 3 (three) times daily as needed (anxiety). 90 tablet 1   Cholecalciferol 25 MCG (1000 UT) tablet Take 1,000 Units by mouth daily.      diltiazem (CARDIZEM CD)  240 MG 24 hr capsule Take 1 capsule (240 mg total) by mouth daily. 90 capsule 3   empagliflozin (JARDIANCE) 10 MG TABS tablet Take 1 tablet (10 mg total) by mouth daily before breakfast. 30 tablet 5   famotidine (PEPCID) 20 MG tablet Take 20 mg by mouth 2 (two) times daily.     ferrous sulfate 325 (65 FE) MG EC tablet Take 325 mg by mouth at bedtime.      furosemide (LASIX) 20 MG tablet Take 1 tablet (20 mg total) by mouth daily as needed for fluid or edema. Take lasix daily, with extra dose as needed for swelling 90 tablet 3   gabapentin (NEURONTIN) 300 MG capsule Take 1 capsule (300 mg total) by mouth at bedtime. 90 capsule 3   ipratropium-albuterol (DUONEB) 0.5-2.5 (3) MG/3ML SOLN Take 3 mLs by nebulization every 4 (four) hours as needed. 720 mL 5   methocarbamol (  ROBAXIN) 500 MG tablet Take 1 tablet (500 mg total) by mouth every 8 (eight) hours as needed for muscle spasms. 90 tablet 3   Multiple Vitamins-Minerals (ONE-A-DAY WOMENS PETITES) TABS Take 2 tablets by mouth every morning.     pantoprazole (PROTONIX) 40 MG tablet Take 1 tablet (40 mg total) by mouth daily. 30 tablet 3   potassium chloride SA (KLOR-CON M) 20 MEQ tablet Take 2 tablets (40 mEq total) by mouth 2 (two) times daily. 120 tablet 3   sertraline (ZOLOFT) 100 MG tablet Take 1 tablet (100 mg total) by mouth daily. 90 tablet 3   SUPER B COMPLEX/C PO Take 1 tablet by mouth daily.     traZODone (DESYREL) 100 MG tablet Take 1 tablet (100 mg total) by mouth at bedtime. 90 tablet 3   TRELEGY ELLIPTA 100-62.5-25 MCG/INH AEPB Inhale 1 puff into the lungs daily.     No current facility-administered medications on file prior to visit.    Allergies:   Azithromycin, Hydromorphone, Morphine, Nsaids, Oxycodone-acetaminophen, Codeine, Nabumetone, Oxycodone, Oxycodone-acetaminophen, and Promethazine hcl   Social History   Tobacco Use   Smoking status: Former    Packs/day: 1.00    Years: 20.00    Total pack years: 20.00    Types:  Cigarettes    Quit date: 07/1981    Years since quitting: 40.6   Smokeless tobacco: Former  Scientific laboratory technician Use: Never used  Substance Use Topics   Alcohol use: Not Currently    Comment: past   Drug use: No      Family Hx: The patient's family history includes Colon cancer in her sister; Diabetes type II in her father and mother; Heart attack in her brother and mother; Heart attack (age of onset: 83) in her sister; Hyperlipidemia in her sister; Hypertension in her mother and sister; Ovarian cancer in her sister; Pneumonia in her father; Skin cancer in her father.  ROS:   Please see the history of present illness.    Review of Systems  Constitutional: Negative.   HENT: Negative.    Respiratory: Negative.    Cardiovascular:  Positive for palpitations.  Gastrointestinal: Negative.   Musculoskeletal: Negative.   Neurological: Negative.   Psychiatric/Behavioral: Negative.    All other systems reviewed and are negative.    Labs/Other Tests and Data Reviewed:    Recent Labs: 11/01/2021: ALT 23 12/02/2021: B Natriuretic Peptide 75.6 02/01/2022: Hemoglobin 13.0; Platelets 206; TSH 3.019 02/12/2022: BUN 16; Creatinine, Ser 1.20; Magnesium 1.8; Potassium 4.3; Sodium 139   Recent Lipid Panel Lab Results  Component Value Date/Time   CHOL 167 02/05/2021 08:11 AM   TRIG 182 (H) 02/05/2021 08:11 AM   HDL 72 02/05/2021 08:11 AM   CHOLHDL 2.3 02/05/2021 08:11 AM   LDLCALC 69 02/05/2021 08:11 AM    Wt Readings from Last 3 Encounters:  03/12/22 193 lb 6 oz (87.7 kg)  02/19/22 193 lb (87.5 kg)  02/01/22 200 lb 8 oz (90.9 kg)     Exam:    BP 100/60 (BP Location: Right Wrist, Patient Position: Sitting, Cuff Size: Large)   Pulse 91   Ht 5' 1"$  (1.549 m)   Wt 193 lb 6 oz (87.7 kg)   SpO2 93%   BMI 36.54 kg/m  Constitutional:  oriented to person, place, and time. No distress.  HENT:  Head: Grossly normal Eyes:  no discharge. No scleral icterus.  Neck: No JVD, no carotid  bruits  Cardiovascular: Regular rate and rhythm, no murmurs  appreciated Pulmonary/Chest: Clear to auscultation bilaterally, no wheezes or rails Abdominal: Soft.  no distension.  no tenderness.  Musculoskeletal: Normal range of motion Neurological:  normal muscle tone. Coordination normal. No atrophy Skin: Skin warm and dry Psychiatric: normal affect, pleasant  ASSESSMENT & PLAN:    paroxysmal atrial fibrillation Noted on Zio monitor, tolerating diltiazem ER to 40 daily  off metoprolol presumably for underlying COPD Given low blood pressure, recommend she take metoprolol tartrate as needed for prolonged episodes of atrial fibrillation.  Typically episodes only last several minutes before resolving  Centrilobular emphysema ([place]) Recent hospitalization for COPD exacerbation November 2023 requiring antibiotics, nebulizers, steroids, followed by Dr. Raul Del On inhalers at home Feels she is back to her baseline  Controlled type 2 diabetes mellitus with diabetic nephropathy, without long-term current use of insulin ([place]) We have encouraged continued exercise, careful diet management   Leg swelling Consistent with lymphedema,Ace wraps,  Leg elevation recommended Symptoms stable, no pitting edema  Hyperlipidemia associated with type 2 diabetes mellitus ([place]) Cholesterol is at goal on the current lipid regimen. No changes to the medications were made.  Essential hypertension Blood pressure is well controlled on today's visit. No changes made to the medications.  Smoker Stopped smoking many years ago Continues to have COPD exacerbation hospitalizations   Total encounter time more than 40 minutes  Greater than 50% was spent in counseling and coordination of care with the patient     Signed, Ida Rogue, MD  03/12/2022 3:29 PM    [facility] Office [address] [place] [address], [place], [place] [address]

## 2022-03-12 ENCOUNTER — Encounter: Payer: Medicare Other | Admitting: Family

## 2022-03-12 ENCOUNTER — Encounter: Payer: Self-pay | Admitting: Cardiovascular Disease

## 2022-03-12 ENCOUNTER — Ambulatory Visit: Payer: Medicare Other | Attending: Cardiovascular Disease | Admitting: Cardiovascular Disease

## 2022-03-12 VITALS — BP 100/60 | HR 91 | Ht 61.0 in | Wt 193.4 lb

## 2022-03-12 DIAGNOSIS — N1832 Chronic kidney disease, stage 3b: Secondary | ICD-10-CM | POA: Diagnosis not present

## 2022-03-12 DIAGNOSIS — I471 Supraventricular tachycardia, unspecified: Secondary | ICD-10-CM | POA: Diagnosis not present

## 2022-03-12 DIAGNOSIS — J441 Chronic obstructive pulmonary disease with (acute) exacerbation: Secondary | ICD-10-CM

## 2022-03-12 DIAGNOSIS — J449 Chronic obstructive pulmonary disease, unspecified: Secondary | ICD-10-CM | POA: Diagnosis not present

## 2022-03-12 DIAGNOSIS — N183 Chronic kidney disease, stage 3 unspecified: Secondary | ICD-10-CM | POA: Diagnosis not present

## 2022-03-12 DIAGNOSIS — I480 Paroxysmal atrial fibrillation: Secondary | ICD-10-CM

## 2022-03-12 DIAGNOSIS — E782 Mixed hyperlipidemia: Secondary | ICD-10-CM

## 2022-03-12 DIAGNOSIS — E1122 Type 2 diabetes mellitus with diabetic chronic kidney disease: Secondary | ICD-10-CM | POA: Diagnosis not present

## 2022-03-12 DIAGNOSIS — I5032 Chronic diastolic (congestive) heart failure: Secondary | ICD-10-CM

## 2022-03-12 MED ORDER — METOPROLOL TARTRATE 25 MG PO TABS: 25.0000 mg | ORAL_TABLET | Freq: Two times a day (BID) | ORAL | 1 refills | Status: DC | PRN

## 2022-03-12 NOTE — Patient Instructions (Signed)
Medication Instructions:  Please take metoprolol tartrate 25 mg up to twice a day as needed for bad afib days   If you need a refill on your cardiac medications before your next appointment, please call your pharmacy.   Lab work: No new labs needed  Testing/Procedures: No new testing needed  Follow-Up: At [facility], you and your health needs are our priority.  As part of our continuing mission to provide you with exceptional heart care, we have created designated Provider Care Teams.  These Care Teams include your primary Cardiologist (physician) and Advanced Practice Providers (APPs -  Physician Assistants and Nurse Practitioners) who all work together to provide you with the care you need, when you need it.  You will need a follow up appointment in 12 months  Providers on your designated Care Team:   Murray Hodgkins, NP Christell Faith, PA-C Cadence Kathlen Mody, [place]  COVID-19 Vaccine Information can be found at: [email or web address] For questions related to vaccine distribution or appointments, please email [email or web address] or call [phone].

## 2022-03-20 DIAGNOSIS — G4733 Obstructive sleep apnea (adult) (pediatric): Secondary | ICD-10-CM | POA: Diagnosis not present

## 2022-03-20 DIAGNOSIS — J432 Centrilobular emphysema: Secondary | ICD-10-CM | POA: Diagnosis not present

## 2022-03-20 DIAGNOSIS — Z9981 Dependence on supplemental oxygen: Secondary | ICD-10-CM | POA: Diagnosis not present

## 2022-03-22 DIAGNOSIS — J449 Chronic obstructive pulmonary disease, unspecified: Secondary | ICD-10-CM | POA: Diagnosis not present

## 2022-03-27 ENCOUNTER — Other Ambulatory Visit
Admission: RE | Admit: 2022-03-27 | Discharge: 2022-03-27 | Disposition: A | Discharge status: Home / Self Care | Payer: Medicare Other | Source: Ambulatory Visit | Attending: Family | Admitting: Family

## 2022-03-27 ENCOUNTER — Encounter: Payer: Self-pay | Admitting: Family

## 2022-03-27 ENCOUNTER — Ambulatory Visit (HOSPITAL_BASED_OUTPATIENT_CLINIC_OR_DEPARTMENT_OTHER): Payer: Medicare Other | Admitting: Family

## 2022-03-27 VITALS — BP 129/63 | HR 85 | Wt 190.0 lb

## 2022-03-27 DIAGNOSIS — F419 Anxiety disorder, unspecified: Secondary | ICD-10-CM | POA: Insufficient documentation

## 2022-03-27 DIAGNOSIS — E1122 Type 2 diabetes mellitus with diabetic chronic kidney disease: Secondary | ICD-10-CM | POA: Insufficient documentation

## 2022-03-27 DIAGNOSIS — I471 Supraventricular tachycardia, unspecified: Secondary | ICD-10-CM | POA: Insufficient documentation

## 2022-03-27 DIAGNOSIS — G4730 Sleep apnea, unspecified: Secondary | ICD-10-CM | POA: Insufficient documentation

## 2022-03-27 DIAGNOSIS — I480 Paroxysmal atrial fibrillation: Secondary | ICD-10-CM | POA: Insufficient documentation

## 2022-03-27 DIAGNOSIS — I5032 Chronic diastolic (congestive) heart failure: Secondary | ICD-10-CM | POA: Insufficient documentation

## 2022-03-27 DIAGNOSIS — K219 Gastro-esophageal reflux disease without esophagitis: Secondary | ICD-10-CM | POA: Insufficient documentation

## 2022-03-27 DIAGNOSIS — E785 Hyperlipidemia, unspecified: Secondary | ICD-10-CM | POA: Insufficient documentation

## 2022-03-27 DIAGNOSIS — J441 Chronic obstructive pulmonary disease with (acute) exacerbation: Secondary | ICD-10-CM | POA: Insufficient documentation

## 2022-03-27 LAB — BASIC METABOLIC PANEL
Anion gap: 8 (ref 5–15)
BUN: 24 mg/dL — ABNORMAL HIGH (ref 8–23)
CO2: 28 mmol/L (ref 22–32)
Calcium: 9.2 mg/dL (ref 8.9–10.3)
Chloride: 101 mmol/L (ref 98–111)
Creatinine, Ser: 1.21 mg/dL — ABNORMAL HIGH (ref 0.44–1.00)
GFR, Estimated: 44 mL/min — ABNORMAL LOW (ref >60)
Glucose, Bld: 177 mg/dL — ABNORMAL HIGH (ref 70–99)
Potassium: 4.1 mmol/L (ref 3.5–5.1)
Sodium: 137 mmol/L (ref 135–145)

## 2022-03-27 NOTE — Progress Notes (Signed)
Patient ID: Jenna [name], female    DOB: 07-25-1935, 87 y.o.   MRN: [id number]  HPI  Jenna [name] is a 87 y/o female with a history of asthma, DM, hyperlipidemia, CKD, anxiety, GERD, COPD, PSVT, sleep apnea, tobacco use and chronic heart failure.   Echo report from 05/18/21 showed an EF of 60-65% along with mild MR and normal PA Pressure of 26.3 mmHg.   Admitted 12/02/21 due to COPD exacerbation. Given steroids. CXR negative for pneumonia. Needed oxygen and unable to be weaned off. 1 dose of IV lasix given due to worsening edema. PT evaluation done. Discharged after 5 days with steroids and antibiotics. Was in the ED 11/02/21 due to COPD exacerbation where she was treated and released.    She presents today for a HF follow-up visit with a chief complaint of moderate fatigue upon minimal exertion. Describes this as chronic in nature. Has associated SOB, pedal edema (improving), palpitations & light-headedness along with this. Denies any difficulty sleeping, abdominal distention, chest pain, wheezing, cough or weight gain.   Past Medical History:  Diagnosis Date   Anxiety    Arthritis    neck, knees(before replacements)   Asthma    Chronic heart failure with preserved ejection fraction (HFpEF) ([place])    a. 04/2021 Echo: EF 60-65% no rwma, nl RV fxn, RVSP 26.73mHg, mild MR.   CKD (chronic kidney disease), stage III (HCC)    COPD (chronic obstructive pulmonary disease) (HCC)    Diabetes mellitus without complication (HDatto    Encounter for colonoscopy due to history of adenomatous colonic polyps    GERD (gastroesophageal reflux disease)    Hearing loss of both ears    Hyperlipidemia    Osteoporosis    PAF (paroxysmal atrial fibrillation) ([place]    a. 12/2021 Zio: Predominantly sinus @ 72. 1% afib burden @ 85-176 bpm. Longest 2h 44m 120. Frequent PACs (6.2%); b. CHA2DS2VASc = 6.   Palpitations    PSVT (paroxysmal supraventricular tachycardia)    a. 05/2018 [facility]io: 99 SVT runs. Fastest 218 x 4:30.  Longest 4:38 w/ rate of 172.   Sleep apnea    resolved with gastric bypass   Syncope and collapse    Urine incontinence    Past Surgical History:  Procedure Laterality Date   BROW LIFT Bilateral 06/27/2015   Procedure: BLEPHAROPLASTY BILATERAL UPPER EYELIDS BILATERAL BLEPHAROTOSIS EYELIDS;  Surgeon: AmKarle StarchMD;  [place] [facility]: [place];  Laterality: Bilateral;  BILATERAL   CATARACT EXTRACTION W/PHACO Right 02/13/2015   Procedure: CATARACT EXTRACTION PHACO AND INTRAOCULAR LENS PLACEMENT (IOC);  Surgeon: AnRonnell FreshwaterMD;  [place] [facility]: Ophthal[place];  Laterality: Right;   CATARACT EXTRACTION W/PHACO Left 03/22/2015   Procedure: CATARACT EXTRACTION PHACO AND INTRAOCULAR LENS PLACEMENT (IOC);  Surgeon: AnRonnell FreshwaterMD;  [place] [facility]: [place];  Laterality: Left;  [facility]     COSMETIC SURGERY  2012   tummy tuck and excess skin removal   GASTRIC BYPASS  2010   REPLACEMENT TOTAL KNEE BILATERAL  1998   SHOULDER SURGERY  2009   left    TONSILLECTOMY     TOTAL ABDOMINAL HYSTERECTOMY  1979   Family History  Problem Relation Age of Onset   Heart attack Mother    Hypertension Mother    Diabetes type II Mother    Pneumonia Father    Skin cancer Father  Diabetes type II Father    Colon cancer Sister    Ovarian cancer Sister    Heart attack Brother    Heart attack Sister 86   Hyperlipidemia Sister    Hypertension Sister    Social History   Tobacco Use   Smoking status: Former    Packs/day: 1.00    Years: 20.00    Total pack years: 20.00    Types: Cigarettes    Quit date: 07/1981    Years since quitting: 40.7   Smokeless tobacco: Former  Substance Use Topics   Alcohol use: Not Currently    Comment: past   Allergies  Allergen Reactions   Azithromycin Other (See Comments)    Causes stomach burning pains   Hydromorphone Other  (See Comments)    confusion, personality change   Morphine Other (See Comments)    Goes crazy   Nsaids Other (See Comments)    Avoids because of gastric bypass surgery   Oxycodone-Acetaminophen Nausea And Vomiting   Codeine Other (See Comments) and Nausea And Vomiting    "will not stay down"   Nabumetone Rash   Oxycodone Nausea And Vomiting   Oxycodone-Acetaminophen Nausea And Vomiting   Promethazine Hcl Rash and Other (See Comments)   Prior to Admission medications   Medication Sig Start Date End Date Taking? Authorizing Provider  albuterol (PROVENTIL) (2.5 MG/3ML) 0.083% nebulizer solution Take 3 mLs (2.5 mg total) by nebulization every 6 (six) hours as needed for wheezing or shortness of breath. 05/21/21 05/21/22 Yes Wieting, Richard, MD  albuterol (VENTOLIN HFA) 108 (90 Base) MCG/ACT inhaler Inhale 2 puffs into the lungs every 6 (six) hours as needed for wheezing or shortness of breath. 12/11/21 03/27/22 Yes Karamalegos, Devonne Doughty, DO  alendronate (FOSAMAX) 70 MG tablet Take 1 tablet (70 mg total) by mouth once a week. Take with a full glass of water on an empty stomach. 07/11/21  Yes Karamalegos, Devonne Doughty, DO  apixaban (ELIQUIS) 5 MG TABS tablet Take 1 tablet (5 mg total) by mouth 2 (two) times daily. 02/01/22  Yes Theora Gianotti, NP  Ascorbic Acid (VITAMIN C) 1000 MG tablet Take 500 mg by mouth daily.    Yes [provider]  atorvastatin (LIPITOR) 20 MG tablet Take 1 tablet (20 mg total) by mouth at bedtime. 12/31/21  Yes Karamalegos, Devonne Doughty, DO  busPIRone (BUSPAR) 5 MG tablet Take 1 tablet (5 mg total) by mouth 3 (three) times daily as needed (anxiety). 07/11/21  Yes Karamalegos, Devonne Doughty, DO  Cholecalciferol 25 MCG (1000 UT) tablet Take 1,000 Units by mouth daily.    Yes [provider]  diltiazem (CARDIZEM CD) 240 MG 24 hr capsule Take 1 capsule (240 mg total) by mouth daily. 02/01/22  Yes Theora Gianotti, NP  empagliflozin (JARDIANCE) 10  MG TABS tablet Take 1 tablet (10 mg total) by mouth daily before breakfast. 02/19/22  Yes Darylene Price A, FNP  famotidine (PEPCID) 20 MG tablet Take 20 mg by mouth 2 (two) times daily.   Yes [provider]  ferrous sulfate 325 (65 FE) MG EC tablet Take 325 mg by mouth at bedtime.    Yes [provider]  furosemide (LASIX) 20 MG tablet Take 1 tablet (20 mg total) by mouth daily as needed for fluid or edema. Take lasix daily, with extra dose as needed for swelling 11/21/21  Yes Karamalegos, Alexander J, DO  gabapentin (NEURONTIN) 300 MG capsule Take 1 capsule (300 mg total) by mouth at bedtime.  05/31/21  Yes Karamalegos, Devonne Doughty, DO  ipratropium-albuterol (DUONEB) 0.5-2.5 (3) MG/3ML SOLN Take 3 mLs by nebulization every 4 (four) hours as needed. 11/09/21  Yes Karamalegos, Devonne Doughty, DO  methocarbamol (ROBAXIN) 500 MG tablet Take 1 tablet (500 mg total) by mouth every 8 (eight) hours as needed for muscle spasms. 04/06/21  Yes Karamalegos, Devonne Doughty, DO  metoprolol tartrate (LOPRESSOR) 25 MG tablet Take 1 tablet (25 mg total) by mouth 2 (two) times daily as needed. 03/12/22 06/10/22 Yes Gollan, Kathlene November, MD  Multiple Vitamins-Minerals (ONE-A-DAY WOMENS PETITES) TABS Take 2 tablets by mouth every morning.   Yes [provider]  pantoprazole (PROTONIX) 40 MG tablet Take 1 tablet (40 mg total) by mouth daily. 12/21/21  Yes Karamalegos, Devonne Doughty, DO  potassium chloride SA (KLOR-CON M) 20 MEQ tablet Take 2 tablets (40 mEq total) by mouth 2 (two) times daily. 02/01/22  Yes Theora Gianotti, NP  sertraline (ZOLOFT) 100 MG tablet Take 1 tablet (100 mg total) by mouth daily. 05/25/21  Yes Karamalegos, Devonne Doughty, DO  SUPER B COMPLEX/C PO Take 1 tablet by mouth daily.   Yes [provider]  traZODone (DESYREL) 100 MG tablet Take 1 tablet (100 mg total) by mouth at bedtime. 02/22/22  Yes Karamalegos, Alexander J, DO  TRELEGY ELLIPTA 100-62.5-25 MCG/INH AEPB Inhale 1  puff into the lungs daily. 07/31/20  Yes [provider]    Review of Systems  Constitutional:  Positive for fatigue (easily). Negative for appetite change.  HENT:  Negative for congestion, postnasal drip and sore throat.   Eyes: Negative.   Respiratory:  Positive for shortness of breath. Negative for cough, chest tightness and wheezing.   Cardiovascular:  Positive for palpitations and leg swelling ("improving"). Negative for chest pain.  Gastrointestinal:  Negative for abdominal distention and abdominal pain.  Endocrine: Negative.   Genitourinary: Negative.   Musculoskeletal:  Negative for back pain and neck pain.  Skin: Negative.   Allergic/Immunologic: Negative.   Neurological:  Positive for light-headedness. Negative for dizziness.  Hematological:  Negative for adenopathy. Does not bruise/bleed easily.  Psychiatric/Behavioral:  Negative for dysphoric mood and sleep disturbance (sleeps in lift chair almost flat). The patient is not nervous/anxious.    Vitals:   03/27/22 1326  BP: 129/63  Pulse: 85  SpO2: 98%  Weight: 190 lb (86.2 kg)   Wt Readings from Last 3 Encounters:  03/27/22 190 lb (86.2 kg)  03/12/22 193 lb 6 oz (87.7 kg)  02/19/22 193 lb (87.5 kg)   Lab Results  Component Value Date   CREATININE 1.21 (H) 03/27/2022   CREATININE 1.20 (H) 02/12/2022   CREATININE 1.00 02/01/2022   Physical Exam Vitals and nursing note reviewed.  Constitutional:      Appearance: Normal appearance.  HENT:     Head: Normocephalic and atraumatic.  Cardiovascular:     Rate and Rhythm: Normal rate. Rhythm irregular.  Pulmonary:     Effort: Pulmonary effort is normal. No respiratory distress.     Breath sounds: No wheezing or rales.  Abdominal:     General: There is no distension.     Palpations: Abdomen is soft.     Tenderness: There is no abdominal tenderness.  Musculoskeletal:        General: No tenderness.     Cervical back: Normal range of motion and neck supple.      Right lower leg: No edema.     Left lower leg: No edema.  Skin:  General: Skin is warm and dry.  Neurological:     General: No focal deficit present.     Mental Status: She is alert and oriented to person, place, and time.  Psychiatric:        Mood and Affect: Mood normal.        Behavior: Behavior normal.        Thought Content: Thought content normal.    Assessment & Plan:  1: Chronic heart failure with preserved ejection fraction without structural changes- - NYHA class III - euvolemic today - weighing daily; reminded to call for an overnight weight gain of > 2 pounds or a weekly weight gain of > 5 pounds - weight down 3 pounds from last visit here 1 month ago - jardiance '10mg'$  daily - furosemide '20mg'$  PRN - BMP today - not adding salt and she tries to review food labels for sodium content - BNP 12/02/21 was 75.6  2: DM with CKD- - saw PCP Parks Ranger) 12/11/21 - BMP 03/27/22 reviewed and showed sodium 137, potassium 4.1, creatinine 1.21 & GFR 44 - A1c 07/11/21 was 7.2%  3: COPD- - saw pulmonology Raul Del) 03/20/22  4: Paroxsymal SVT- - saw cardiology Rockey Situ) 03/12/22 - diltiazem '240mg'$  daily - apixaban '5mg'$  BID - metoprolol tartrate '25mg'$  BID PRN   Medication list reviewed.   Return in 6 months, sooner if needed.

## 2022-04-08 ENCOUNTER — Ambulatory Visit: Payer: Medicare Other | Admitting: Cardiovascular Disease

## 2022-04-17 ENCOUNTER — Ambulatory Visit: Payer: Medicare Other | Admitting: Cardiovascular Disease

## 2022-05-03 ENCOUNTER — Other Ambulatory Visit: Payer: Self-pay | Admitting: Pharmacist

## 2022-05-03 NOTE — Progress Notes (Signed)
   Outreach Note  05/03/2022 Name: Jenna [name] MRN: [id number] DOB: 1935/11/10  Coordination of Care Call  Outreach to patient today regarding Jardiance medication adherence as requested by patient's health plan.   From review of dispensing history in chart, note patient last had Jardiance 10 mg Rx filled on 03/25/2022 for 30 day supply.  Today patient denies having difficulty with obtaining/affording her Jardiance prescription. Reports that her daughter helps manage/picks up this medication for her and that she will check with her daughter about this medication and call me or office back if any assistance is needed.   Provide patient with my contact information. Patient denies further medication questions/concerns today.   Estelle Grumbles, PharmD, [facility] Clinical Pharmacist [facility] [phone]

## 2022-06-13 ENCOUNTER — Other Ambulatory Visit: Payer: Self-pay

## 2022-06-13 DIAGNOSIS — F5104 Psychophysiologic insomnia: Secondary | ICD-10-CM

## 2022-06-13 DIAGNOSIS — M159 Polyosteoarthritis, unspecified: Secondary | ICD-10-CM

## 2022-06-13 DIAGNOSIS — G8929 Other chronic pain: Secondary | ICD-10-CM

## 2022-06-13 DIAGNOSIS — F331 Major depressive disorder, recurrent, moderate: Secondary | ICD-10-CM

## 2022-06-13 MED ORDER — GABAPENTIN 300 MG PO CAPS: 300.0000 mg | ORAL_CAPSULE | Freq: Every day | ORAL | 0 refills | Status: DC

## 2022-06-13 MED ORDER — SERTRALINE HCL 100 MG PO TABS: 100.0000 mg | ORAL_TABLET | Freq: Every day | ORAL | 0 refills | Status: DC

## 2022-06-18 ENCOUNTER — Telehealth: Payer: Self-pay | Admitting: Family Medicine

## 2022-06-18 NOTE — Telephone Encounter (Signed)
Pts daughter Bari Edward [phone]) is calling to see how she can be certified to do Home Health for her parents. And looking to receive a referral for neurology because the patient is very forgetful.

## 2022-06-18 NOTE — Telephone Encounter (Signed)
Appointment scheduled to discuss needs.

## 2022-06-24 ENCOUNTER — Ambulatory Visit: Payer: Medicare Other | Admitting: Family Medicine

## 2022-06-28 ENCOUNTER — Ambulatory Visit (INDEPENDENT_AMBULATORY_CARE_PROVIDER_SITE_OTHER): Payer: Medicare Other | Admitting: Family Medicine

## 2022-06-28 ENCOUNTER — Encounter: Payer: Self-pay | Admitting: Family Medicine

## 2022-06-28 VITALS — BP 132/70 | HR 76 | Temp 97.1°F | Ht 61.0 in | Wt 190.0 lb

## 2022-06-28 DIAGNOSIS — R6889 Other general symptoms and signs: Secondary | ICD-10-CM

## 2022-06-28 DIAGNOSIS — F5104 Psychophysiologic insomnia: Secondary | ICD-10-CM

## 2022-06-28 DIAGNOSIS — Z7984 Long term (current) use of oral hypoglycemic drugs: Secondary | ICD-10-CM | POA: Diagnosis not present

## 2022-06-28 DIAGNOSIS — R413 Other amnesia: Secondary | ICD-10-CM | POA: Diagnosis not present

## 2022-06-28 DIAGNOSIS — J432 Centrilobular emphysema: Secondary | ICD-10-CM

## 2022-06-28 DIAGNOSIS — E1121 Type 2 diabetes mellitus with diabetic nephropathy: Secondary | ICD-10-CM

## 2022-06-28 DIAGNOSIS — F331 Major depressive disorder, recurrent, moderate: Secondary | ICD-10-CM

## 2022-06-28 LAB — POCT GLYCOSYLATED HEMOGLOBIN (HGB A1C): Hemoglobin A1C: 6.9 % — AB (ref 4.0–5.6)

## 2022-06-28 MED ORDER — ZOLPIDEM TARTRATE ER 6.25 MG PO TBCR: 6.2500 mg | EXTENDED_RELEASE_TABLET | Freq: Every evening | ORAL | 2 refills | Status: DC | PRN

## 2022-06-28 MED ORDER — SERTRALINE HCL 100 MG PO TABS: 100.0000 mg | ORAL_TABLET | Freq: Every day | ORAL | 3 refills | Status: DC

## 2022-06-28 NOTE — Patient Instructions (Addendum)
Thank you for coming to the office today.  Will connect you with [facility] for our chronic care management team, they may be able to answer the home health questions.  Start new sleeping pill - Zolpidem CR ambien generic 6.25 nightly, to help reset your sleep cycle.  Okay to continue Trazodone and Sertraline.  Keep on Prevagen  We can consider Aricept / Donepezil in the future for memory / cognitive if worsening.   Please schedule a Follow-up Appointment to: Return in about 3 months (around 09/28/2022) for 3 month follow-up Mood, Sleep, Memory, updates.  If you have any other questions or concerns, please feel free to call the office or send a message through MyChart. You may also schedule an earlier appointment if necessary.  Additionally, you may be receiving a survey about your experience at our office within a few days to 1 week by e-mail or mail. We value your feedback.  Saralyn Pilar, DO [facility] [place], [place]

## 2022-06-28 NOTE — Progress Notes (Signed)
Subjective:    Patient ID: Jenna [name], female    DOB: 1935-07-23, 87 y.o.   MRN: [id number]  Jenna [name] is a 87 y.o. female presenting on 06/28/2022 for Memory Loss (Pt comes in to discuss possible Home Health Care for memory issues ) and Anxiety (Would like to discuss increasing the Zoloft )   HPI  Type 2 DM Controlled A1c down to 6.9 On Jardiance  Forgetfulness / Memory Loss Recurrent Depression  PHQ9 Insomnia  Still forgetful on names places face, but not having other cognitive decline Taking Prevagen  Current medication - Sertraline 100mg  daily, and Trazodone 100mg  nightly Still has issues with sleep and insomnia - Admits difficulty with sleeping, can stay up 1-2 nights all night      06/28/2022    2:21 PM 10/15/2021    3:17 PM 07/11/2021    5:14 PM  Depression screen PHQ 2/9  Decreased Interest 3 0 2  Down, Depressed, Hopeless 3 3 1   PHQ - 2 Score 6 3 3   Altered sleeping 3 1 2   Tired, decreased energy 3 0 1  Change in appetite 3 0   Feeling bad or failure about yourself  0 0 0  Trouble concentrating 0 0 0  Moving slowly or fidgety/restless 3 0 0  Suicidal thoughts 0 0 0  PHQ-9 Score 18 4 6   Difficult doing work/chores Very difficult Not difficult at all Somewhat difficult      06/28/2022    2:22 PM 03/07/2021   11:19 AM 02/01/2019    1:34 PM 02/13/2018    3:10 PM  GAD 7 : Generalized Anxiety Score  Nervous, Anxious, on Edge 2 0 1 1  Control/stop worrying 2 0 0 0  Worry too much - different things 3 0 0 0  Trouble relaxing 2 0 1 0  Restless 1 1 0 0  Easily annoyed or irritable 2 1 0 1  Afraid - awful might happen 0 0 0 0  Total GAD 7 Score 12 2 2 2   Anxiety Difficulty Somewhat difficult Not difficult at all Not difficult at all Somewhat difficult      Social History   Tobacco Use   Smoking status: Former    Packs/day: 1.00    Years: 20.00    Additional pack years: 0.00    Total pack years: 20.00    Types: Cigarettes    Quit date:  07/1981    Years since quitting: 40.9   Smokeless tobacco: Former  Building services engineer Use: Never used  Substance Use Topics   Alcohol use: Not Currently    Comment: past   Drug use: No    Review of Systems Per HPI unless specifically indicated above     Objective:    BP 132/70 (BP Location: Left Arm, Patient Position: Sitting, Cuff Size: Normal)   Pulse 76   Temp (!) 97.1 F (36.2 C) (Oral)   Ht 5\' 1"  (1.549 m)   Wt 190 lb (86.2 kg)   SpO2 97%   BMI 35.90 kg/m   Wt Readings from Last 3 Encounters:  06/28/22 190 lb (86.2 kg)  03/27/22 190 lb (86.2 kg)  03/12/22 193 lb 6 oz (87.7 kg)    Physical Exam Vitals and nursing note reviewed.  Constitutional:      General: She is not in acute distress.    Appearance: She is well-developed. She is obese. She is not diaphoretic.     Comments: Well-appearing, comfortable, cooperative  HENT:     Head: Normocephalic and atraumatic.  Eyes:     General:        Right eye: No discharge.        Left eye: No discharge.     Conjunctiva/sclera: Conjunctivae normal.  Neck:     Thyroid: No thyromegaly.  Cardiovascular:     Rate and Rhythm: Normal rate and regular rhythm.     Heart sounds: Normal heart sounds. No murmur heard. Pulmonary:     Effort: Pulmonary effort is normal. No respiratory distress.     Breath sounds: Normal breath sounds. No wheezing or rales.  Musculoskeletal:        General: Normal range of motion.     Cervical back: Normal range of motion and neck supple.  Lymphadenopathy:     Cervical: No cervical adenopathy.  Skin:    General: Skin is warm and dry.     Findings: No erythema or rash.  Neurological:     Mental Status: She is alert and oriented to person, place, and time.  Psychiatric:        Behavior: Behavior normal.     Comments: Well groomed, good eye contact, normal speech and thoughts, some forgetfulness and recall during discussion     Diabetic Foot Exam - Simple   Simple Foot Form Diabetic  Foot exam was performed with the following findings: Yes 06/28/2022 11:29 PM  Visual Inspection See comments: Yes Sensation Testing Intact to touch and monofilament testing bilaterally: Yes Pulse Check Posterior Tibialis and Dorsalis pulse intact bilaterally: Yes Comments Bilateral callus formation  heels and forefoot, no ulceration      Results for orders placed or performed in visit on 06/28/22  POCT glycosylated hemoglobin (Hb A1C)  Result Value Ref Range   Hemoglobin A1C 6.9 (A) 4.0 - 5.6 %   HbA1c POC (<> result, manual entry)     HbA1c, POC (prediabetic range)     HbA1c, POC (controlled diabetic range)        Assessment & Plan:   Problem List Items Addressed This Visit     Depression, major, recurrent (HCC)   Relevant Medications   sertraline (ZOLOFT) 100 MG tablet   Other Relevant Orders   AMB Referral to Chronic Care Management Services   Psychophysiological insomnia   Relevant Medications   zolpidem (AMBIEN CR) 6.25 MG CR tablet   sertraline (ZOLOFT) 100 MG tablet   Other Relevant Orders   AMB Referral to Chronic Care Management Services   Other Visit Diagnoses     Controlled type 2 diabetes mellitus with diabetic nephropathy, without long-term current use of insulin (HCC)    -  Primary   Relevant Orders   POCT glycosylated hemoglobin (Hb A1C) (Completed)   AMB Referral to Chronic Care Management Services   Memory loss       Relevant Orders   AMB Referral to Chronic Care Management Services   Forgetfulness       Centrilobular emphysema (HCC)       Relevant Orders   AMB Referral to Chronic Care Management Services       Referral to CCM today, to resume services Goal to improve caregiver support of family and offer resources  Type 2 DM A1c 6.9, improved Continue diet modification management  On Jardiance  Major Depression recurrent, Anxiety Memory Loss Insomnia Suspected variety of issues including memory loss forgetfulness can be related to her  very poor sleep schedule insomnia  Start new sleeping pill - Zolpidem CR  ambien generic 6.25 nightly, to help reset your sleep cycle. Okay to continue Trazodone and Sertraline.  Keep on Prevagen  We can consider Aricept / Donepezil in the future for memory / cognitive if worsening.  Meds ordered this encounter  Medications   zolpidem (AMBIEN CR) 6.25 MG CR tablet    Sig: Take 1 tablet (6.25 mg total) by mouth at bedtime as needed for sleep.    Dispense:  30 tablet    Refill:  2   sertraline (ZOLOFT) 100 MG tablet    Sig: Take 1 tablet (100 mg total) by mouth daily. Please schedule an office visit before anymore refills.    Dispense:  90 tablet    Refill:  3    Add refills   Orders Placed This Encounter  Procedures   AMB Referral to Chronic Care Management Services    Referral Priority:   Routine    Referral Type:   Consultation    Referral Reason:   Chronic Care Management (CCM)    Number of Visits Requested:   1   POCT glycosylated hemoglobin (Hb A1C)     Follow up plan: Return in about 3 months (around 09/28/2022) for 3 month follow-up Mood, Sleep, Memory, updates.   Saralyn Pilar, DO [facility] 06/28/2022, 2:19 PM

## 2022-07-11 ENCOUNTER — Other Ambulatory Visit: Payer: Self-pay

## 2022-07-11 MED ORDER — PANTOPRAZOLE SODIUM 40 MG PO TBEC: 40.0000 mg | DELAYED_RELEASE_TABLET | Freq: Every day | ORAL | 3 refills | Status: DC

## 2022-07-12 ENCOUNTER — Telehealth: Payer: Self-pay

## 2022-07-12 NOTE — Progress Notes (Signed)
  Chronic Care Management   Note  07/12/2022 Name: [name] MRN: [id number] DOB: 05/07/1935  Jenna [name] is a 87 y.o. year old female who is a primary care patient of Smitty Cords, DO. I reached out to Sissy Hoff Rede by phone today in response to a referral sent by Ms. Sissy Hoff Malmquist's PCP.  Ms. Sylvia was given information about Chronic Care Management services today including:  CCM service includes personalized support from designated clinical staff supervised by the physician, including individualized plan of care and coordination with other care providers 24/7 contact phone numbers for assistance for urgent and routine care needs. Service will only be billed when office clinical staff spend 20 minutes or more in a month to coordinate care. Only one practitioner may furnish and bill the service in a calendar month. The patient may stop CCM services at amy time (effective at the end of the month) by phone call to the office staff. The patient will be responsible for cost sharing (co-pay) or up to 20% of the service fee (after annual deductible is met)  Ms. Sissy Hoff Sandefur  agreedto scheduling an appointment with the CCM RN Case Manager   Follow up plan: Patient agreed to scheduled appointment with RN Case Manager on 08/05/2022(date/time).   Penne Lash, RMA Care Guide [facility]  [place], [place] [address] Direct Dial: [phone] [email or web address]

## 2022-07-27 DIAGNOSIS — E278 Other specified disorders of adrenal gland: Secondary | ICD-10-CM | POA: Diagnosis not present

## 2022-07-27 DIAGNOSIS — R739 Hyperglycemia, unspecified: Secondary | ICD-10-CM | POA: Diagnosis not present

## 2022-07-27 DIAGNOSIS — K219 Gastro-esophageal reflux disease without esophagitis: Secondary | ICD-10-CM | POA: Diagnosis not present

## 2022-07-27 DIAGNOSIS — J45909 Unspecified asthma, uncomplicated: Secondary | ICD-10-CM | POA: Diagnosis not present

## 2022-07-27 DIAGNOSIS — N189 Chronic kidney disease, unspecified: Secondary | ICD-10-CM | POA: Diagnosis not present

## 2022-07-27 DIAGNOSIS — I130 Hypertensive heart and chronic kidney disease with heart failure and stage 1 through stage 4 chronic kidney disease, or unspecified chronic kidney disease: Secondary | ICD-10-CM | POA: Diagnosis not present

## 2022-07-27 DIAGNOSIS — R7401 Elevation of levels of liver transaminase levels: Secondary | ICD-10-CM | POA: Diagnosis not present

## 2022-07-27 DIAGNOSIS — Z789 Other specified health status: Secondary | ICD-10-CM | POA: Diagnosis not present

## 2022-07-27 DIAGNOSIS — K449 Diaphragmatic hernia without obstruction or gangrene: Secondary | ICD-10-CM | POA: Diagnosis not present

## 2022-07-27 DIAGNOSIS — I471 Supraventricular tachycardia, unspecified: Secondary | ICD-10-CM | POA: Diagnosis not present

## 2022-07-27 DIAGNOSIS — I493 Ventricular premature depolarization: Secondary | ICD-10-CM | POA: Diagnosis not present

## 2022-07-27 DIAGNOSIS — Y92009 Unspecified place in unspecified non-institutional (private) residence as the place of occurrence of the external cause: Secondary | ICD-10-CM | POA: Diagnosis not present

## 2022-07-27 DIAGNOSIS — Z7901 Long term (current) use of anticoagulants: Secondary | ICD-10-CM | POA: Diagnosis not present

## 2022-07-27 DIAGNOSIS — Z7984 Long term (current) use of oral hypoglycemic drugs: Secondary | ICD-10-CM | POA: Diagnosis not present

## 2022-07-27 DIAGNOSIS — E785 Hyperlipidemia, unspecified: Secondary | ICD-10-CM | POA: Diagnosis not present

## 2022-07-27 DIAGNOSIS — Z888 Allergy status to other drugs, medicaments and biological substances status: Secondary | ICD-10-CM | POA: Diagnosis not present

## 2022-07-27 DIAGNOSIS — R9431 Abnormal electrocardiogram [ECG] [EKG]: Secondary | ICD-10-CM | POA: Diagnosis not present

## 2022-07-27 DIAGNOSIS — I480 Paroxysmal atrial fibrillation: Secondary | ICD-10-CM | POA: Diagnosis not present

## 2022-07-27 DIAGNOSIS — I4719 Other supraventricular tachycardia: Secondary | ICD-10-CM | POA: Diagnosis not present

## 2022-07-27 DIAGNOSIS — Z9181 History of falling: Secondary | ICD-10-CM | POA: Diagnosis not present

## 2022-07-27 DIAGNOSIS — R0781 Pleurodynia: Secondary | ICD-10-CM | POA: Diagnosis not present

## 2022-07-27 DIAGNOSIS — G4733 Obstructive sleep apnea (adult) (pediatric): Secondary | ICD-10-CM | POA: Diagnosis not present

## 2022-07-27 DIAGNOSIS — R1011 Right upper quadrant pain: Secondary | ICD-10-CM | POA: Diagnosis not present

## 2022-07-27 DIAGNOSIS — J449 Chronic obstructive pulmonary disease, unspecified: Secondary | ICD-10-CM | POA: Diagnosis not present

## 2022-07-27 DIAGNOSIS — R52 Pain, unspecified: Secondary | ICD-10-CM | POA: Diagnosis not present

## 2022-07-27 DIAGNOSIS — Z79899 Other long term (current) drug therapy: Secondary | ICD-10-CM | POA: Diagnosis not present

## 2022-07-27 DIAGNOSIS — W19XXXA Unspecified fall, initial encounter: Secondary | ICD-10-CM | POA: Diagnosis not present

## 2022-07-27 DIAGNOSIS — W1830XA Fall on same level, unspecified, initial encounter: Secondary | ICD-10-CM | POA: Diagnosis not present

## 2022-07-27 DIAGNOSIS — I5030 Unspecified diastolic (congestive) heart failure: Secondary | ICD-10-CM | POA: Diagnosis not present

## 2022-07-27 DIAGNOSIS — E279 Disorder of adrenal gland, unspecified: Secondary | ICD-10-CM | POA: Diagnosis not present

## 2022-07-27 DIAGNOSIS — Z885 Allergy status to narcotic agent status: Secondary | ICD-10-CM | POA: Diagnosis not present

## 2022-07-27 DIAGNOSIS — Z9884 Bariatric surgery status: Secondary | ICD-10-CM | POA: Diagnosis not present

## 2022-07-27 DIAGNOSIS — Z043 Encounter for examination and observation following other accident: Secondary | ICD-10-CM | POA: Diagnosis not present

## 2022-07-27 DIAGNOSIS — J432 Centrilobular emphysema: Secondary | ICD-10-CM | POA: Diagnosis not present

## 2022-07-27 DIAGNOSIS — Z743 Need for continuous supervision: Secondary | ICD-10-CM | POA: Diagnosis not present

## 2022-07-27 DIAGNOSIS — I5032 Chronic diastolic (congestive) heart failure: Secondary | ICD-10-CM | POA: Diagnosis not present

## 2022-07-27 DIAGNOSIS — E119 Type 2 diabetes mellitus without complications: Secondary | ICD-10-CM | POA: Diagnosis not present

## 2022-07-27 DIAGNOSIS — R6889 Other general symptoms and signs: Secondary | ICD-10-CM | POA: Diagnosis not present

## 2022-07-27 DIAGNOSIS — G4700 Insomnia, unspecified: Secondary | ICD-10-CM | POA: Diagnosis not present

## 2022-07-27 DIAGNOSIS — R413 Other amnesia: Secondary | ICD-10-CM | POA: Diagnosis not present

## 2022-07-27 DIAGNOSIS — S2231XA Fracture of one rib, right side, initial encounter for closed fracture: Secondary | ICD-10-CM | POA: Diagnosis not present

## 2022-07-27 DIAGNOSIS — F32A Depression, unspecified: Secondary | ICD-10-CM | POA: Diagnosis not present

## 2022-07-27 DIAGNOSIS — M6250 Muscle wasting and atrophy, not elsewhere classified, unspecified site: Secondary | ICD-10-CM | POA: Diagnosis not present

## 2022-07-27 DIAGNOSIS — M545 Low back pain, unspecified: Secondary | ICD-10-CM | POA: Diagnosis not present

## 2022-07-27 DIAGNOSIS — E1122 Type 2 diabetes mellitus with diabetic chronic kidney disease: Secondary | ICD-10-CM | POA: Diagnosis not present

## 2022-07-29 DIAGNOSIS — R52 Pain, unspecified: Secondary | ICD-10-CM | POA: Diagnosis not present

## 2022-07-29 DIAGNOSIS — R748 Abnormal levels of other serum enzymes: Secondary | ICD-10-CM | POA: Diagnosis not present

## 2022-07-29 DIAGNOSIS — S2241XD Multiple fractures of ribs, right side, subsequent encounter for fracture with routine healing: Secondary | ICD-10-CM | POA: Diagnosis not present

## 2022-07-29 DIAGNOSIS — Z9884 Bariatric surgery status: Secondary | ICD-10-CM | POA: Diagnosis not present

## 2022-07-29 DIAGNOSIS — I4719 Other supraventricular tachycardia: Secondary | ICD-10-CM | POA: Diagnosis not present

## 2022-07-29 DIAGNOSIS — F32A Depression, unspecified: Secondary | ICD-10-CM | POA: Diagnosis not present

## 2022-07-29 DIAGNOSIS — Z79899 Other long term (current) drug therapy: Secondary | ICD-10-CM | POA: Diagnosis not present

## 2022-07-29 DIAGNOSIS — E278 Other specified disorders of adrenal gland: Secondary | ICD-10-CM | POA: Diagnosis not present

## 2022-07-29 DIAGNOSIS — Z0389 Encounter for observation for other suspected diseases and conditions ruled out: Secondary | ICD-10-CM | POA: Diagnosis not present

## 2022-07-29 DIAGNOSIS — K5900 Constipation, unspecified: Secondary | ICD-10-CM | POA: Diagnosis not present

## 2022-07-29 DIAGNOSIS — E7800 Pure hypercholesterolemia, unspecified: Secondary | ICD-10-CM | POA: Diagnosis not present

## 2022-07-29 DIAGNOSIS — R296 Repeated falls: Secondary | ICD-10-CM | POA: Diagnosis not present

## 2022-07-29 DIAGNOSIS — I480 Paroxysmal atrial fibrillation: Secondary | ICD-10-CM | POA: Diagnosis not present

## 2022-07-29 DIAGNOSIS — R5381 Other malaise: Secondary | ICD-10-CM | POA: Diagnosis not present

## 2022-07-29 DIAGNOSIS — Z7984 Long term (current) use of oral hypoglycemic drugs: Secondary | ICD-10-CM | POA: Diagnosis not present

## 2022-07-29 DIAGNOSIS — Z7901 Long term (current) use of anticoagulants: Secondary | ICD-10-CM | POA: Diagnosis not present

## 2022-07-29 DIAGNOSIS — W19XXXD Unspecified fall, subsequent encounter: Secondary | ICD-10-CM | POA: Diagnosis not present

## 2022-07-29 DIAGNOSIS — W19XXXA Unspecified fall, initial encounter: Secondary | ICD-10-CM | POA: Diagnosis not present

## 2022-07-29 DIAGNOSIS — M5459 Other low back pain: Secondary | ICD-10-CM | POA: Diagnosis not present

## 2022-07-29 DIAGNOSIS — G8929 Other chronic pain: Secondary | ICD-10-CM | POA: Diagnosis not present

## 2022-07-29 DIAGNOSIS — M85872 Other specified disorders of bone density and structure, left ankle and foot: Secondary | ICD-10-CM | POA: Diagnosis not present

## 2022-07-29 DIAGNOSIS — M1990 Unspecified osteoarthritis, unspecified site: Secondary | ICD-10-CM | POA: Diagnosis not present

## 2022-07-29 DIAGNOSIS — M810 Age-related osteoporosis without current pathological fracture: Secondary | ICD-10-CM | POA: Diagnosis not present

## 2022-07-29 DIAGNOSIS — K219 Gastro-esophageal reflux disease without esophagitis: Secondary | ICD-10-CM | POA: Diagnosis not present

## 2022-07-29 DIAGNOSIS — R0781 Pleurodynia: Secondary | ICD-10-CM | POA: Diagnosis not present

## 2022-07-29 DIAGNOSIS — Z1152 Encounter for screening for COVID-19: Secondary | ICD-10-CM | POA: Diagnosis not present

## 2022-07-29 DIAGNOSIS — Z743 Need for continuous supervision: Secondary | ICD-10-CM | POA: Diagnosis not present

## 2022-07-29 DIAGNOSIS — E559 Vitamin D deficiency, unspecified: Secondary | ICD-10-CM | POA: Diagnosis not present

## 2022-07-29 DIAGNOSIS — N189 Chronic kidney disease, unspecified: Secondary | ICD-10-CM | POA: Diagnosis not present

## 2022-07-29 DIAGNOSIS — Z881 Allergy status to other antibiotic agents status: Secondary | ICD-10-CM | POA: Diagnosis not present

## 2022-07-29 DIAGNOSIS — D689 Coagulation defect, unspecified: Secondary | ICD-10-CM | POA: Diagnosis not present

## 2022-07-29 DIAGNOSIS — E538 Deficiency of other specified B group vitamins: Secondary | ICD-10-CM | POA: Diagnosis not present

## 2022-07-29 DIAGNOSIS — S36119A Unspecified injury of liver, initial encounter: Secondary | ICD-10-CM | POA: Diagnosis not present

## 2022-07-29 DIAGNOSIS — Z888 Allergy status to other drugs, medicaments and biological substances status: Secondary | ICD-10-CM | POA: Diagnosis not present

## 2022-07-29 DIAGNOSIS — K838 Other specified diseases of biliary tract: Secondary | ICD-10-CM | POA: Diagnosis not present

## 2022-07-29 DIAGNOSIS — M545 Low back pain, unspecified: Secondary | ICD-10-CM | POA: Diagnosis not present

## 2022-07-29 DIAGNOSIS — S36112A Contusion of liver, initial encounter: Secondary | ICD-10-CM | POA: Diagnosis not present

## 2022-07-29 DIAGNOSIS — G2581 Restless legs syndrome: Secondary | ICD-10-CM | POA: Diagnosis not present

## 2022-07-29 DIAGNOSIS — G4700 Insomnia, unspecified: Secondary | ICD-10-CM | POA: Diagnosis not present

## 2022-07-29 DIAGNOSIS — E785 Hyperlipidemia, unspecified: Secondary | ICD-10-CM | POA: Diagnosis not present

## 2022-07-29 DIAGNOSIS — R7401 Elevation of levels of liver transaminase levels: Secondary | ICD-10-CM | POA: Diagnosis not present

## 2022-07-29 DIAGNOSIS — I130 Hypertensive heart and chronic kidney disease with heart failure and stage 1 through stage 4 chronic kidney disease, or unspecified chronic kidney disease: Secondary | ICD-10-CM | POA: Diagnosis not present

## 2022-07-29 DIAGNOSIS — I5030 Unspecified diastolic (congestive) heart failure: Secondary | ICD-10-CM | POA: Diagnosis not present

## 2022-07-29 DIAGNOSIS — Z885 Allergy status to narcotic agent status: Secondary | ICD-10-CM | POA: Diagnosis not present

## 2022-07-29 DIAGNOSIS — W1839XD Other fall on same level, subsequent encounter: Secondary | ICD-10-CM | POA: Diagnosis not present

## 2022-07-29 DIAGNOSIS — S2241XA Multiple fractures of ribs, right side, initial encounter for closed fracture: Secondary | ICD-10-CM | POA: Diagnosis not present

## 2022-07-29 DIAGNOSIS — K769 Liver disease, unspecified: Secondary | ICD-10-CM | POA: Diagnosis not present

## 2022-07-29 DIAGNOSIS — Z96653 Presence of artificial knee joint, bilateral: Secondary | ICD-10-CM | POA: Diagnosis not present

## 2022-07-29 DIAGNOSIS — N182 Chronic kidney disease, stage 2 (mild): Secondary | ICD-10-CM | POA: Diagnosis not present

## 2022-07-29 DIAGNOSIS — J4489 Other specified chronic obstructive pulmonary disease: Secondary | ICD-10-CM | POA: Diagnosis not present

## 2022-07-29 DIAGNOSIS — I10 Essential (primary) hypertension: Secondary | ICD-10-CM | POA: Diagnosis not present

## 2022-07-29 DIAGNOSIS — J449 Chronic obstructive pulmonary disease, unspecified: Secondary | ICD-10-CM | POA: Diagnosis not present

## 2022-07-29 DIAGNOSIS — Z7189 Other specified counseling: Secondary | ICD-10-CM | POA: Diagnosis not present

## 2022-07-29 DIAGNOSIS — I471 Supraventricular tachycardia, unspecified: Secondary | ICD-10-CM | POA: Diagnosis not present

## 2022-07-29 DIAGNOSIS — K8689 Other specified diseases of pancreas: Secondary | ICD-10-CM | POA: Diagnosis not present

## 2022-07-29 DIAGNOSIS — E1122 Type 2 diabetes mellitus with diabetic chronic kidney disease: Secondary | ICD-10-CM | POA: Diagnosis not present

## 2022-07-29 DIAGNOSIS — I5032 Chronic diastolic (congestive) heart failure: Secondary | ICD-10-CM | POA: Diagnosis not present

## 2022-07-29 DIAGNOSIS — S99912A Unspecified injury of left ankle, initial encounter: Secondary | ICD-10-CM | POA: Diagnosis not present

## 2022-08-05 ENCOUNTER — Telehealth: Payer: Medicare Other

## 2022-08-05 ENCOUNTER — Telehealth: Payer: Self-pay

## 2022-08-05 NOTE — Telephone Encounter (Signed)
   CCM RN Visit Note   08-05-2022 Name: Jenna [name] MRN: [id number]      DOB: 1935/01/22  Subjective: Jenna [name] is a 87 y.o. year old female who is a primary care patient of Dr. Althea Charon.  The patient was referred to the Chronic Care Management team for assistance with care management needs subsequent to provider initiation of CCM services and plan of care.      An unsuccessful telephone outreach was attempted today to contact the patient about Chronic Care Management needs.  Per the notes the patient was at [facility] on 07-29-2022 after a fall and was transferred to [facility] on 08-02-2022. Will continue to monitor for discharge.   Plan:The care management team will reach out to the patient again over the next 30 days.  Alto Denver RN, MSN, CCM RN Care Manager  Chronic Care Management Direct Number: [phone]

## 2022-08-06 DIAGNOSIS — S36119A Unspecified injury of liver, initial encounter: Secondary | ICD-10-CM | POA: Diagnosis not present

## 2022-08-06 DIAGNOSIS — I130 Hypertensive heart and chronic kidney disease with heart failure and stage 1 through stage 4 chronic kidney disease, or unspecified chronic kidney disease: Secondary | ICD-10-CM | POA: Diagnosis not present

## 2022-08-06 DIAGNOSIS — K769 Liver disease, unspecified: Secondary | ICD-10-CM | POA: Diagnosis not present

## 2022-08-06 DIAGNOSIS — I471 Supraventricular tachycardia, unspecified: Secondary | ICD-10-CM | POA: Diagnosis not present

## 2022-08-06 DIAGNOSIS — E785 Hyperlipidemia, unspecified: Secondary | ICD-10-CM | POA: Diagnosis not present

## 2022-08-06 DIAGNOSIS — Z743 Need for continuous supervision: Secondary | ICD-10-CM | POA: Diagnosis not present

## 2022-08-06 DIAGNOSIS — E7800 Pure hypercholesterolemia, unspecified: Secondary | ICD-10-CM | POA: Diagnosis not present

## 2022-08-06 DIAGNOSIS — E559 Vitamin D deficiency, unspecified: Secondary | ICD-10-CM | POA: Diagnosis not present

## 2022-08-06 DIAGNOSIS — I480 Paroxysmal atrial fibrillation: Secondary | ICD-10-CM | POA: Diagnosis not present

## 2022-08-06 DIAGNOSIS — N189 Chronic kidney disease, unspecified: Secondary | ICD-10-CM | POA: Diagnosis not present

## 2022-08-06 DIAGNOSIS — I10 Essential (primary) hypertension: Secondary | ICD-10-CM | POA: Diagnosis not present

## 2022-08-06 DIAGNOSIS — R5381 Other malaise: Secondary | ICD-10-CM | POA: Diagnosis not present

## 2022-08-06 DIAGNOSIS — E1122 Type 2 diabetes mellitus with diabetic chronic kidney disease: Secondary | ICD-10-CM | POA: Diagnosis not present

## 2022-08-06 DIAGNOSIS — K5900 Constipation, unspecified: Secondary | ICD-10-CM | POA: Diagnosis not present

## 2022-08-06 DIAGNOSIS — S2241XD Multiple fractures of ribs, right side, subsequent encounter for fracture with routine healing: Secondary | ICD-10-CM | POA: Diagnosis not present

## 2022-08-06 DIAGNOSIS — Z7901 Long term (current) use of anticoagulants: Secondary | ICD-10-CM | POA: Diagnosis not present

## 2022-08-06 DIAGNOSIS — I5032 Chronic diastolic (congestive) heart failure: Secondary | ICD-10-CM | POA: Diagnosis not present

## 2022-08-06 DIAGNOSIS — M5459 Other low back pain: Secondary | ICD-10-CM | POA: Diagnosis not present

## 2022-08-06 DIAGNOSIS — G4700 Insomnia, unspecified: Secondary | ICD-10-CM | POA: Diagnosis not present

## 2022-08-06 DIAGNOSIS — R52 Pain, unspecified: Secondary | ICD-10-CM | POA: Diagnosis not present

## 2022-08-06 DIAGNOSIS — M810 Age-related osteoporosis without current pathological fracture: Secondary | ICD-10-CM | POA: Diagnosis not present

## 2022-08-06 DIAGNOSIS — W19XXXD Unspecified fall, subsequent encounter: Secondary | ICD-10-CM | POA: Diagnosis not present

## 2022-08-06 DIAGNOSIS — G2581 Restless legs syndrome: Secondary | ICD-10-CM | POA: Diagnosis not present

## 2022-08-06 DIAGNOSIS — K219 Gastro-esophageal reflux disease without esophagitis: Secondary | ICD-10-CM | POA: Diagnosis not present

## 2022-08-06 DIAGNOSIS — M1990 Unspecified osteoarthritis, unspecified site: Secondary | ICD-10-CM | POA: Diagnosis not present

## 2022-08-06 DIAGNOSIS — Z7984 Long term (current) use of oral hypoglycemic drugs: Secondary | ICD-10-CM | POA: Diagnosis not present

## 2022-08-06 DIAGNOSIS — D689 Coagulation defect, unspecified: Secondary | ICD-10-CM | POA: Diagnosis not present

## 2022-08-06 DIAGNOSIS — E538 Deficiency of other specified B group vitamins: Secondary | ICD-10-CM | POA: Diagnosis not present

## 2022-08-06 DIAGNOSIS — Z96653 Presence of artificial knee joint, bilateral: Secondary | ICD-10-CM | POA: Diagnosis not present

## 2022-08-06 DIAGNOSIS — W19XXXA Unspecified fall, initial encounter: Secondary | ICD-10-CM | POA: Diagnosis not present

## 2022-08-06 DIAGNOSIS — I5030 Unspecified diastolic (congestive) heart failure: Secondary | ICD-10-CM | POA: Diagnosis not present

## 2022-08-06 DIAGNOSIS — J449 Chronic obstructive pulmonary disease, unspecified: Secondary | ICD-10-CM | POA: Diagnosis not present

## 2022-08-06 DIAGNOSIS — R0781 Pleurodynia: Secondary | ICD-10-CM | POA: Diagnosis not present

## 2022-08-06 DIAGNOSIS — R7401 Elevation of levels of liver transaminase levels: Secondary | ICD-10-CM | POA: Diagnosis not present

## 2022-08-07 DIAGNOSIS — I480 Paroxysmal atrial fibrillation: Secondary | ICD-10-CM | POA: Diagnosis not present

## 2022-08-07 DIAGNOSIS — W19XXXD Unspecified fall, subsequent encounter: Secondary | ICD-10-CM | POA: Diagnosis not present

## 2022-08-07 DIAGNOSIS — M5459 Other low back pain: Secondary | ICD-10-CM | POA: Diagnosis not present

## 2022-08-07 DIAGNOSIS — I5032 Chronic diastolic (congestive) heart failure: Secondary | ICD-10-CM | POA: Diagnosis not present

## 2022-08-07 DIAGNOSIS — K219 Gastro-esophageal reflux disease without esophagitis: Secondary | ICD-10-CM | POA: Diagnosis not present

## 2022-08-07 DIAGNOSIS — E785 Hyperlipidemia, unspecified: Secondary | ICD-10-CM | POA: Diagnosis not present

## 2022-08-07 DIAGNOSIS — Z7984 Long term (current) use of oral hypoglycemic drugs: Secondary | ICD-10-CM | POA: Diagnosis not present

## 2022-08-07 DIAGNOSIS — G2581 Restless legs syndrome: Secondary | ICD-10-CM | POA: Diagnosis not present

## 2022-08-07 DIAGNOSIS — S2241XD Multiple fractures of ribs, right side, subsequent encounter for fracture with routine healing: Secondary | ICD-10-CM | POA: Diagnosis not present

## 2022-08-07 DIAGNOSIS — E1122 Type 2 diabetes mellitus with diabetic chronic kidney disease: Secondary | ICD-10-CM | POA: Diagnosis not present

## 2022-08-07 DIAGNOSIS — J449 Chronic obstructive pulmonary disease, unspecified: Secondary | ICD-10-CM | POA: Diagnosis not present

## 2022-08-07 DIAGNOSIS — G4700 Insomnia, unspecified: Secondary | ICD-10-CM | POA: Diagnosis not present

## 2022-08-09 DIAGNOSIS — I5032 Chronic diastolic (congestive) heart failure: Secondary | ICD-10-CM | POA: Diagnosis not present

## 2022-08-09 DIAGNOSIS — G2581 Restless legs syndrome: Secondary | ICD-10-CM | POA: Diagnosis not present

## 2022-08-09 DIAGNOSIS — E785 Hyperlipidemia, unspecified: Secondary | ICD-10-CM | POA: Diagnosis not present

## 2022-08-09 DIAGNOSIS — E1122 Type 2 diabetes mellitus with diabetic chronic kidney disease: Secondary | ICD-10-CM | POA: Diagnosis not present

## 2022-08-09 DIAGNOSIS — Z7984 Long term (current) use of oral hypoglycemic drugs: Secondary | ICD-10-CM | POA: Diagnosis not present

## 2022-08-09 DIAGNOSIS — S2241XD Multiple fractures of ribs, right side, subsequent encounter for fracture with routine healing: Secondary | ICD-10-CM | POA: Diagnosis not present

## 2022-08-09 DIAGNOSIS — J449 Chronic obstructive pulmonary disease, unspecified: Secondary | ICD-10-CM | POA: Diagnosis not present

## 2022-08-09 DIAGNOSIS — I480 Paroxysmal atrial fibrillation: Secondary | ICD-10-CM | POA: Diagnosis not present

## 2022-08-09 DIAGNOSIS — G4700 Insomnia, unspecified: Secondary | ICD-10-CM | POA: Diagnosis not present

## 2022-08-09 DIAGNOSIS — W19XXXD Unspecified fall, subsequent encounter: Secondary | ICD-10-CM | POA: Diagnosis not present

## 2022-08-09 DIAGNOSIS — K219 Gastro-esophageal reflux disease without esophagitis: Secondary | ICD-10-CM | POA: Diagnosis not present

## 2022-08-09 DIAGNOSIS — M5459 Other low back pain: Secondary | ICD-10-CM | POA: Diagnosis not present

## 2022-08-22 ENCOUNTER — Encounter: Payer: Self-pay | Admitting: Family Medicine

## 2022-08-22 DIAGNOSIS — E278 Other specified disorders of adrenal gland: Secondary | ICD-10-CM | POA: Insufficient documentation

## 2022-08-23 ENCOUNTER — Encounter: Payer: Self-pay | Admitting: Pharmacist

## 2022-08-23 ENCOUNTER — Encounter: Payer: Self-pay | Admitting: Physician Assistant

## 2022-08-23 ENCOUNTER — Ambulatory Visit (INDEPENDENT_AMBULATORY_CARE_PROVIDER_SITE_OTHER): Payer: Medicare Other | Admitting: Physician Assistant

## 2022-08-23 VITALS — BP 133/58 | HR 58 | Temp 96.0°F | Wt 179.0 lb

## 2022-08-23 DIAGNOSIS — Z09 Encounter for follow-up examination after completed treatment for conditions other than malignant neoplasm: Secondary | ICD-10-CM | POA: Diagnosis not present

## 2022-08-23 DIAGNOSIS — R296 Repeated falls: Secondary | ICD-10-CM

## 2022-08-23 DIAGNOSIS — F5104 Psychophysiologic insomnia: Secondary | ICD-10-CM

## 2022-08-23 MED ORDER — QUETIAPINE FUMARATE 25 MG PO TABS
12.5000 mg | ORAL_TABLET | Freq: Every day | ORAL | 0 refills | Status: DC
Start: 2022-08-23 — End: 2022-09-16

## 2022-08-23 NOTE — Assessment & Plan Note (Signed)
Chronic, ongoing, currently exacerbated Patient and family report that she has stopped taking Ambien due to causing unsteadiness but is having issues with sleep maintenance and initiation  Reviewed that other insomnia medications are likely to cause similar issues so I do not recommend starting others at this time given fall risk and patient age Discussed trying Seroquel for now- patient and daughter would like to try short duration for now and see if this is sufficient Will send 5 quantity for now of Seroquel 25 mg PO at bedtime- can take 0.5-1 tab at bedtime Recommend follow up as planned with PCP to discuss response and further management

## 2022-08-23 NOTE — Progress Notes (Signed)
Acute Office Visit   Patient: Jenna [name]   DOB: [birth year]   87 y.o. Female  MRN: [id number] Visit Date: 08/23/2022  Today's healthcare provider: Oswaldo Conroy , PA-C  Introduced myself to the patient as a Secondary school teacher and provided education on APPs in clinical practice.    Chief Complaint  Patient presents with   Hospitalization Follow-up    Pt off Ambien and would like to try an alternative.     Subjective    HPI HPI     Hospitalization Follow-up    Additional comments: Pt off Ambien and would like to try an alternative.        Last edited by Paschal Dopp, CMA on 08/23/2022 10:38 AM.       She is here with her daughter   Reviewed most recent hospitalization records regarding a fall ED, imaging and labs along with DC recommendations were reviewed as well   She reports she is feeling fine  They state she fell and broken two ribs and bruised her liver Reviewed her DC paperwork- liver testing was stabilizing at time of dc She reports some pain in her ribs with palpation and pressure applied to the area She is out of the skilled facility and is back home with family   Her daughter states they have stopped the Ambien due to making pt unsteady and sedated They would like an alternative to assist with the insomnia  She is taking Trazodone 100 mg po at bedtime and is also taking Gabapentin for RLS  They have tried OTC medications as well       Medications: Outpatient Medications Prior to Visit  Medication Sig   alendronate (FOSAMAX) 70 MG tablet Take 1 tablet (70 mg total) by mouth once a week. Take with a full glass of water on an empty stomach.   apixaban (ELIQUIS) 5 MG TABS tablet Take 1 tablet (5 mg total) by mouth 2 (two) times daily.   Ascorbic Acid (VITAMIN C) 1000 MG tablet Take 500 mg by mouth daily.    atorvastatin (LIPITOR) 20 MG tablet Take 1 tablet (20 mg total) by mouth at bedtime.   busPIRone (BUSPAR) 5 MG tablet Take 1 tablet (5 mg total) by  mouth 3 (three) times daily as needed (anxiety).   Cholecalciferol 25 MCG (1000 UT) tablet Take 1,000 Units by mouth daily.    diltiazem (CARDIZEM CD) 240 MG 24 hr capsule Take 1 capsule (240 mg total) by mouth daily.   empagliflozin (JARDIANCE) 10 MG TABS tablet Take 1 tablet (10 mg total) by mouth daily before breakfast.   famotidine (PEPCID) 20 MG tablet Take 20 mg by mouth 2 (two) times daily.   ferrous sulfate 325 (65 FE) MG EC tablet Take 325 mg by mouth at bedtime.    furosemide (LASIX) 20 MG tablet Take 1 tablet (20 mg total) by mouth daily as needed for fluid or edema. Take lasix daily, with extra dose as needed for swelling   gabapentin (NEURONTIN) 300 MG capsule Take 1 capsule (300 mg total) by mouth at bedtime. Please schedule an office visit before anymore refills.   ipratropium-albuterol (DUONEB) 0.5-2.5 (3) MG/3ML SOLN Take 3 mLs by nebulization every 4 (four) hours as needed.   methocarbamol (ROBAXIN) 500 MG tablet Take 1 tablet (500 mg total) by mouth every 8 (eight) hours as needed for muscle spasms.   Multiple Vitamins-Minerals (ONE-A-DAY WOMENS PETITES) TABS Take 1 tablet by mouth every  morning.   pantoprazole (PROTONIX) 40 MG tablet Take 1 tablet (40 mg total) by mouth daily.   potassium chloride SA (KLOR-CON M) 20 MEQ tablet Take 2 tablets (40 mEq total) by mouth 2 (two) times daily.   sertraline (ZOLOFT) 100 MG tablet Take 1 tablet (100 mg total) by mouth daily. Please schedule an office visit before anymore refills.   SUPER B COMPLEX/C PO Take 1 tablet by mouth daily.   traZODone (DESYREL) 100 MG tablet Take 1 tablet (100 mg total) by mouth at bedtime.   TRELEGY ELLIPTA 100-62.5-25 MCG/INH AEPB Inhale 1 puff into the lungs daily.   albuterol (PROVENTIL) (2.5 MG/3ML) 0.083% nebulizer solution Take 3 mLs (2.5 mg total) by nebulization every 6 (six) hours as needed for wheezing or shortness of breath.   albuterol (VENTOLIN HFA) 108 (90 Base) MCG/ACT inhaler Inhale 2 puffs into  the lungs every 6 (six) hours as needed for wheezing or shortness of breath.   metoprolol tartrate (LOPRESSOR) 25 MG tablet Take 1 tablet (25 mg total) by mouth 2 (two) times daily as needed.   [DISCONTINUED] zolpidem (AMBIEN CR) 6.25 MG CR tablet Take 1 tablet (6.25 mg total) by mouth at bedtime as needed for sleep. (Patient not taking: Reported on 08/23/2022)   No facility-administered medications prior to visit.    Review of Systems  Constitutional:  Negative for chills and fever.  Respiratory:  Negative for chest tightness, shortness of breath and wheezing.   Cardiovascular:  Negative for chest pain, palpitations and leg swelling.  Neurological:  Negative for syncope.         Objective    BP (!) 133/58 (BP Location: Left Wrist, Patient Position: Sitting, Cuff Size: Small)   Pulse (!) 58   Temp (!) 96 F (35.6 C) (Temporal)   Wt 179 lb (81.2 kg) Comment: Per pt  SpO2 99%   BMI 33.82 kg/m      Physical Exam Vitals reviewed.  Constitutional:      General: She is awake.     Appearance: Normal appearance. She is well-developed and well-groomed.  HENT:     Head: Normocephalic and atraumatic.  Cardiovascular:     Rate and Rhythm: Normal rate and regular rhythm.     Heart sounds: Normal heart sounds.  Pulmonary:     Effort: Pulmonary effort is normal.     Breath sounds: Normal breath sounds. No decreased air movement. No decreased breath sounds, wheezing, rhonchi or rales.  Abdominal:     General: Abdomen is flat. Bowel sounds are normal.     Palpations: Abdomen is soft.  Neurological:     Mental Status: She is alert.  Psychiatric:        Attention and Perception: Attention and perception normal.        Mood and Affect: Mood normal.        Speech: Speech normal.        Behavior: Behavior normal. Behavior is cooperative.       No results found for any visits on 08/23/22.  Assessment & Plan      Return in about 6 weeks (around 10/04/2022) for insomnia follow up .       Problem List Items Addressed This Visit       Other   Psychophysiological insomnia    Chronic, ongoing, currently exacerbated Patient and family report that she has stopped taking Ambien due to causing unsteadiness but is having issues with sleep maintenance and initiation  Reviewed that other insomnia medications  are likely to cause similar issues so I do not recommend starting others at this time given fall risk and patient age Discussed trying Seroquel for now- patient and daughter would like to try short duration for now and see if this is sufficient Will send 5 quantity for now of Seroquel 25 mg PO at bedtime- can take 0.5-1 tab at bedtime Recommend follow up as planned with PCP to discuss response and further management       Relevant Medications   QUEtiapine (SEROQUEL) 25 MG tablet   Other Visit Diagnoses     Hospital discharge follow-up    -  Primary Patient was hospitalized 07/29/22-08/06/22 after fall which left her with rib fractures and liver bruising Reviewed dc summary, labs, imaging today  Will recheck hepatic function panel for monitoring today- results to dictate further management PE is overall reassuring Will place referral to PT services to assist with balance and gait with goal of improving ambulation and patient safety Recommend she continue current medication regimen for now Follow up with PCP as scheduled    Relevant Orders   Hepatic function panel   Ambulatory referral to Physical Therapy   Falls frequently       Relevant Orders   Ambulatory referral to Physical Therapy        Return in about 6 weeks (around 10/04/2022) for insomnia follow up .   I,  E , PA-C, have reviewed all documentation for this visit. The documentation on 08/23/22 for the exam, diagnosis, procedures, and orders are all accurate and complete.   Jacquelin Hawking, MHS, PA-C [facility]

## 2022-08-23 NOTE — Patient Instructions (Signed)
You can try the Seroquel at night to help with sleep I recommend trying to take a half tablet at first then progressing to a whole as needed Please call Dr. Kirtland Bouchard as needed for refills for this

## 2022-08-23 NOTE — Progress Notes (Signed)
   08/23/2022  Patient ID: Jenna [name], female   DOB: 03/05/1935, 87 y.o.   MRN: [id number]  Pharmacy Quality Measure Review  This patient is appearing on a report related to the adherence measure for diabetes medications this calendar year.   Medication: Jardiance 10 mg  Last fill date: 07/17/2022 for 30 day supply  From review of chart, note patient recently admitted to [facility] 07/27/2022-07/28/2022 and to [facility] 07/29/2022-08/06/2022 and discharged to Skilled nursing facility (SNF)  Today outreach to patient by telephone who request that I speak with her daughter, [name] (listed on DPR in chart).   Outreach to [employer] who reports patient currently has a 2 week supply of Jardiance remaining at home. Shares that patient has more medication remaining due to time that she was in hospitalized and at SNF. Reports that she manages weekly AM and PM pillboxes for patient to aid with medication adherence.  From review of chart, note RN Care Manager [place] attempted to reach patient on 7/15 related to referral by PCP, but was unsuccessful as patient was in SNF. Provide daughter with phone number for [facility] and scheduler today as requested.  Caregiver denies medication questions or concerns today on behalf of patient Provide patient with contact information for clinic pharmacist to contact if needed in future for medication questions/concerns  Estelle Grumbles, PharmD, [facility] Clinical Pharmacist [facility] [phone]

## 2022-08-28 ENCOUNTER — Telehealth: Payer: Self-pay | Admitting: *Deleted

## 2022-08-28 NOTE — Telephone Encounter (Signed)
  Chief Complaint: Results Symptoms: NA Frequency: NA Pertinent Negatives: Patient denies NA Disposition: [] ED /[] Urgent Care (no appt availability in office) / [] Appointment(In office/virtual)/ []  [facility]/ [] Home Care/ [] Refused Recommended Disposition /[] [facility]/ [x]  Follow-up with PCP Additional Notes:  'Raynelle Fanning' with [facility] calling. States she is part of "Incidental findings team" at [facility].  States pt had CT at [facility] 07/28/22 On 07/29/22 it was noted "Nodule on right adrenal gland.' States she has faxed info to practice 08/21/22 and was following up to ensure info was received.  States not need to respond unless any questions. [phone]

## 2022-08-29 NOTE — Telephone Encounter (Signed)
PCP can address this upon his return. Thx!

## 2022-09-03 NOTE — Telephone Encounter (Signed)
I am aware of is and we can discuss in future regarding potential repeat imaging but it is not required at this time.  Saralyn Pilar, DO [facility] 09/03/2022, 2:13 PM

## 2022-09-06 DIAGNOSIS — R2689 Other abnormalities of gait and mobility: Secondary | ICD-10-CM | POA: Diagnosis not present

## 2022-09-06 DIAGNOSIS — M6281 Muscle weakness (generalized): Secondary | ICD-10-CM | POA: Diagnosis not present

## 2022-09-06 DIAGNOSIS — R296 Repeated falls: Secondary | ICD-10-CM | POA: Diagnosis not present

## 2022-09-09 DIAGNOSIS — R2689 Other abnormalities of gait and mobility: Secondary | ICD-10-CM | POA: Diagnosis not present

## 2022-09-09 DIAGNOSIS — R296 Repeated falls: Secondary | ICD-10-CM | POA: Diagnosis not present

## 2022-09-09 DIAGNOSIS — M6281 Muscle weakness (generalized): Secondary | ICD-10-CM | POA: Diagnosis not present

## 2022-09-10 ENCOUNTER — Other Ambulatory Visit: Payer: Self-pay

## 2022-09-10 ENCOUNTER — Encounter: Payer: Self-pay | Admitting: Emergency Medicine

## 2022-09-10 ENCOUNTER — Emergency Department
Admission: EM | Admit: 2022-09-10 | Discharge: 2022-09-10 | Disposition: A | Discharge status: Home / Self Care | Payer: Medicare Other | Attending: Emergency Medicine | Admitting: Emergency Medicine

## 2022-09-10 ENCOUNTER — Emergency Department: Payer: Medicare Other

## 2022-09-10 DIAGNOSIS — Z743 Need for continuous supervision: Secondary | ICD-10-CM | POA: Diagnosis not present

## 2022-09-10 DIAGNOSIS — W19XXXA Unspecified fall, initial encounter: Secondary | ICD-10-CM | POA: Diagnosis not present

## 2022-09-10 DIAGNOSIS — S42212A Unspecified displaced fracture of surgical neck of left humerus, initial encounter for closed fracture: Secondary | ICD-10-CM | POA: Diagnosis not present

## 2022-09-10 DIAGNOSIS — S4992XA Unspecified injury of left shoulder and upper arm, initial encounter: Secondary | ICD-10-CM | POA: Diagnosis present

## 2022-09-10 DIAGNOSIS — S4292XA Fracture of left shoulder girdle, part unspecified, initial encounter for closed fracture: Secondary | ICD-10-CM | POA: Diagnosis not present

## 2022-09-10 DIAGNOSIS — M79603 Pain in arm, unspecified: Secondary | ICD-10-CM | POA: Diagnosis not present

## 2022-09-10 DIAGNOSIS — N189 Chronic kidney disease, unspecified: Secondary | ICD-10-CM | POA: Diagnosis not present

## 2022-09-10 DIAGNOSIS — M25519 Pain in unspecified shoulder: Secondary | ICD-10-CM | POA: Diagnosis not present

## 2022-09-10 DIAGNOSIS — J449 Chronic obstructive pulmonary disease, unspecified: Secondary | ICD-10-CM | POA: Diagnosis not present

## 2022-09-10 DIAGNOSIS — Y9301 Activity, walking, marching and hiking: Secondary | ICD-10-CM | POA: Diagnosis not present

## 2022-09-10 DIAGNOSIS — S42292A Other displaced fracture of upper end of left humerus, initial encounter for closed fracture: Secondary | ICD-10-CM | POA: Diagnosis not present

## 2022-09-10 DIAGNOSIS — Y92000 Kitchen of unspecified non-institutional (private) residence as  the place of occurrence of the external cause: Secondary | ICD-10-CM | POA: Diagnosis not present

## 2022-09-10 DIAGNOSIS — R0902 Hypoxemia: Secondary | ICD-10-CM | POA: Diagnosis not present

## 2022-09-10 DIAGNOSIS — S42202A Unspecified fracture of upper end of left humerus, initial encounter for closed fracture: Secondary | ICD-10-CM | POA: Diagnosis not present

## 2022-09-10 DIAGNOSIS — M19012 Primary osteoarthritis, left shoulder: Secondary | ICD-10-CM | POA: Diagnosis not present

## 2022-09-10 MED ORDER — ONDANSETRON HCL 4 MG/2ML IJ SOLN
4.0000 mg | Freq: Once | INTRAMUSCULAR | Status: AC
  Administered 2022-09-10: 4 mg via INTRAVENOUS
  Filled 2022-09-10: qty 2

## 2022-09-10 MED ORDER — ONDANSETRON 4 MG PO TBDP: 4.0000 mg | ORAL_TABLET | Freq: Three times a day (TID) | ORAL | 0 refills | Status: DC | PRN

## 2022-09-10 MED ORDER — OXYCODONE-ACETAMINOPHEN 5-325 MG PO TABS: 1.0000 | ORAL_TABLET | Freq: Four times a day (QID) | ORAL | 0 refills | Status: AC | PRN

## 2022-09-10 MED ORDER — OXYCODONE-ACETAMINOPHEN 5-325 MG PO TABS
1.0000 | ORAL_TABLET | Freq: Once | ORAL | Status: AC
  Administered 2022-09-10: 1 via ORAL
  Filled 2022-09-10: qty 1

## 2022-09-10 NOTE — ED Triage Notes (Signed)
Patient to ED via [facility] from home after a fall. Patient was walking into kitchen and fell on left shoulder. Given 100 mcg of fentanyl with EMS. 20 R AC

## 2022-09-10 NOTE — ED Provider Notes (Signed)
[facility] [place] Emergency Department Provider Note     Event Date/Time   First MD Initiated Contact with Patient 09/10/22 1112     (approximate)   History   Fall   HPI  Jenna [name] is a 87 y.o. female with a history of COPD, CKD, PAF, HLD, GERD, presents to the ED from her home via EMS.  She lives in the home with her adult daughter who is at bedside.  Patient apparently sustained a mechanical fall as she walked into the kitchen of her home.  Patient endorses left shoulder pain and disability.  She was apparently given 100 mcg of fentanyl en route to the ED.    Physical Exam   Triage Vital Signs: ED Triage Vitals  Encounter Vitals Group     BP 09/10/22 1035 (!) 117/52     Systolic BP Percentile --      Diastolic BP Percentile --      Pulse Rate 09/10/22 1035 70     Resp 09/10/22 1035 18     Temp 09/10/22 1035 97.7 F (36.5 C)     Temp Source 09/10/22 1035 Oral     SpO2 09/10/22 1035 93 %     Weight 09/10/22 1032 179 lb (81.2 kg)     Height 09/10/22 1032 5\' 1"  (1.549 m)     Head Circumference --      Peak Flow --      Pain Score 09/10/22 1032 3     Pain Loc --      Pain Education --      Exclude from Growth Chart --     Most recent vital signs: Vitals:   09/10/22 1035 09/10/22 1217  BP: (!) 117/52 (!) 120/50  Pulse: 70 70  Resp: 18 18  Temp: 97.7 F (36.5 C)   SpO2: 93% 94%    General Awake, no distress. NAD HEENT NCAT. PERRL. EOMI. No rhinorrhea. Mucous membranes are moist.  CV:  Good peripheral perfusion.  RESP:  Normal effort. CTA ABD:  No distention.  MSK:  Left upper extremity without obvious deformity or dislocation.  No sulcus sign appreciated at the left shoulder.  Patient with decreased range of motion of the shoulder localizing pain to the anterior dorsal shoulder.  Normal composite fist distally. NEURO: Cranial nerves II to XII grossly intact.   ED Results / Procedures / Treatments   Labs (all labs ordered are  listed, but only abnormal results are displayed) Labs Reviewed - No data to display  EKG   RADIOLOGY  I personally viewed and evaluated these images as part of my medical decision making, as well as reviewing the written report by the radiologist.  ED Provider Interpretation: comminuted/impacted humeral neck fracture  DG Shoulder Left  Result Date: 09/10/2022 CLINICAL DATA:  Left shoulder pain. EXAM: LEFT SHOULDER - 2+ VIEW COMPARISON:  None Available. FINDINGS: Comminuted impacted fracture of the left humeral neck. The left humerus is normally located. Osteoarthritic changes of the glenohumeral and acromioclavicular joints. IMPRESSION: Comminuted impacted fracture of the left humeral neck. Electronically Signed   By: Ted Mcalpine M.D.   On: 09/10/2022 12:38     PROCEDURES:  Critical Care performed: No  Procedures   MEDICATIONS ORDERED IN ED: Medications  ondansetron (ZOFRAN) injection 4 mg (4 mg Intravenous Given 09/10/22 1201)  oxyCODONE-acetaminophen (PERCOCET/ROXICET) 5-325 MG per tablet 1 tablet (1 tablet Oral Given 09/10/22 1201)     IMPRESSION / MDM / ASSESSMENT AND PLAN /  ED COURSE  I reviewed the triage vital signs and the nursing notes.                              Differential diagnosis includes, but is not limited to, humeral fracture, shoulder dislocation, tendinitis   ----------------------------------------- 11:33 AM on 09/10/2022 ----------------------------------------- S/W Dr. Joice Lofts: he agrees to the plan for shoulder immobilizer and outpatient management.   Patient's presentation is most consistent with acute complicated illness / injury requiring diagnostic workup.  Patient's diagnosis is consistent with comminuted humeral neck fracture. Patient will be discharged home with prescriptions for oxycodone and Zofran. Patient is to follow up with Dep[facility] as discussed, as needed or otherwise directed. Patient is given ED precautions to return to the ED  for any worsening or new symptoms.  FINAL CLINICAL IMPRESSION(S) / ED DIAGNOSES   Final diagnoses:  Fall in home, initial encounter  Closed fracture of left shoulder, initial encounter     Rx / DC Orders   ED Discharge Orders          Ordered    ondansetron (ZOFRAN-ODT) 4 MG disintegrating tablet  Every 8 hours PRN        09/10/22 1150    oxyCODONE-acetaminophen (PERCOCET) 5-325 MG tablet  Every 6 hours PRN        09/10/22 1150             Note:  This document was prepared using Dragon voice recognition software and may include unintentional dictation errors.    Lissa Hoard, PA-C 09/10/22 1918    Trinna Post, MD 09/12/22 [phone]

## 2022-09-10 NOTE — Discharge Instructions (Signed)
Take the prescription meds as directed. Ice the joint for 20 minutes at a time. Wear the arm immobilizer for support. Follow-up with Dr. Joice Lofts as discussed.

## 2022-09-13 ENCOUNTER — Other Ambulatory Visit: Payer: Medicare Other

## 2022-09-13 ENCOUNTER — Other Ambulatory Visit: Payer: Self-pay

## 2022-09-13 DIAGNOSIS — R110 Nausea: Secondary | ICD-10-CM | POA: Diagnosis not present

## 2022-09-13 DIAGNOSIS — S42292A Other displaced fracture of upper end of left humerus, initial encounter for closed fracture: Secondary | ICD-10-CM | POA: Diagnosis not present

## 2022-09-13 NOTE — Patient Instructions (Signed)
Visit Information  Thank you for taking time to visit with me today. Please don't hesitate to contact me if I can be of assistance to you before our next scheduled telephone appointment.  Following are the goals we discussed today:  Today's TOC FU Call Status: Today's TOC FU Call Status:: Successful TOC FU Call Completed TOC FU Call Complete Date: 09/10/22 Patient's Name and Date of Birth confirmed.   Transition Care Management Follow-up Telephone Call Date of Discharge: 09/10/22 Discharge Facility: [facility]) Type of Discharge: Emergency Department Reason for ED Visit: Orthopedic Conditions Orthopedic/Injury Diagnosis: Fracture (fall with fracture to left shoulder) How have you been since you were released from the hospital?: Same Any questions or concerns?: No   Items Reviewed: Did you receive and understand the discharge instructions provided?: Yes Medications obtained,verified, and reconciled?: No (patients daughter was in the orthopedic provider office with the patient being seen after her fall) Medications Not Reviewed Reasons:: Other: (The patient was at the orthopedic provider being seen and the daughter ask for a call back next week) Any new allergies since your discharge?: No Dietary orders reviewed?: No Do you have support at home?: Yes People in Home: child(ren), adult Name of Support/Comfort Primary Source: Bari Edward, daughter   Medications Reviewed Today: Medications Reviewed Today       Reviewed by Marlowe Sax, RN (Case Manager) on 09/13/22 at 1531  Med List Status: <None>    Medication Order Taking? Sig Documenting Provider Last Dose Status Informant  albuterol (PROVENTIL) (2.5 MG/3ML) 0.083% nebulizer solution [id number] No Take 3 mLs (2.5 mg total) by nebulization every 6 (six) hours as needed for wheezing or shortness of breath. Alford Highland, MD Taking Expired 05/21/22 2359 Child  albuterol (VENTOLIN HFA) 108 (90 Base)  MCG/ACT inhaler [id number] No Inhale 2 puffs into the lungs every 6 (six) hours as needed for wheezing or shortness of breath. Smitty Cords, DO Taking Expired 03/27/22 2359    alendronate (FOSAMAX) 70 MG tablet [id number] No Take 1 tablet (70 mg total) by mouth once a week. Take with a full glass of water on an empty stomach. Smitty Cords, DO Taking Active Child  apixaban (ELIQUIS) 5 MG TABS tablet [id number] No Take 1 tablet (5 mg total) by mouth 2 (two) times daily. Creig Hines, NP Taking Active    Ascorbic Acid (VITAMIN C) 1000 MG tablet [id number] No Take 500 mg by mouth daily.  [provider] Taking Active Self, Child                   Med Note Debroah Loop, ADRIENNE E   Fri Sep 22, 2015  1:41 PM)    atorvastatin (LIPITOR) 20 MG tablet [id number] No Take 1 tablet (20 mg total) by mouth at bedtime. Smitty Cords, DO Taking Active    busPIRone (BUSPAR) 5 MG tablet [id number] No Take 1 tablet (5 mg total) by mouth 3 (three) times daily as needed (anxiety). Smitty Cords, DO Taking Active Child  Cholecalciferol 25 MCG (1000 UT) tablet [id number] No Take 1,000 Units by mouth daily.  [provider] Taking Active Self, Child                   Med Note Debroah Loop, ADRIENNE E   Fri Sep 22, 2015  1:41 PM)    diltiazem (CARDIZEM CD) 240 MG 24 hr capsule [id number] No Take 1 capsule (240 mg total) by mouth daily. Creig Hines, NP Taking  Active    empagliflozin (JARDIANCE) 10 MG TABS tablet [id number] No Take 1 tablet (10 mg total) by mouth daily before breakfast. Delma Freeze, FNP Taking Active    famotidine (PEPCID) 20 MG tablet [id number] No Take 20 mg by mouth 2 (two) times daily. [provider] Taking Active Self, Child  ferrous sulfate 325 (65 FE) MG EC tablet [id number] No Take 325 mg by mouth at bedtime.  [provider] Taking Active Self, Child                   Med Note (ARNOLD, ADRIENNE E   Fri Sep 22, 2015  1:40 PM)    furosemide (LASIX) 20 MG tablet [id number] No Take 1 tablet (20 mg total) by mouth daily as needed for fluid or edema. Take lasix daily, with extra dose as needed for swelling Smitty Cords, DO Taking Active Child  gabapentin (NEURONTIN) 300 MG capsule [id number] No Take 1 capsule (300 mg total) by mouth at bedtime. Please schedule an office visit before anymore refills. Lorre Munroe, NP Taking Active    ipratropium-albuterol (DUONEB) 0.5-2.5 (3) MG/3ML SOLN [id number] No Take 3 mLs by nebulization every 4 (four) hours as needed. Smitty Cords, DO Taking Active Child  methocarbamol (ROBAXIN) 500 MG tablet [id number] No Take 1 tablet (500 mg total) by mouth every 8 (eight) hours as needed for muscle spasms. Smitty Cords, DO Taking Active Child  metoprolol tartrate (LOPRESSOR) 25 MG tablet [id number] No Take 1 tablet (25 mg total) by mouth 2 (two) times daily as needed. Antonieta Iba, MD Taking Expired 06/10/22 2359    Multiple Vitamins-Minerals (ONE-A-DAY WOMENS PETITES) TABS [id number] No Take 1 tablet by mouth every morning. [provider] Taking Active Self, Child  ondansetron (ZOFRAN-ODT) 4 MG disintegrating tablet [id number]   Take 1 tablet (4 mg total) by mouth every 8 (eight) hours as needed for nausea or vomiting. Menshew, Charlesetta Ivory, PA-C   Active    oxyCODONE-acetaminophen (PERCOCET) 5-325 MG tablet [id number]   Take 1 tablet by mouth every 6 (six) hours as needed for up to 5 days for severe pain. Menshew, Charlesetta Ivory, PA-C   Active    pantoprazole (PROTONIX) 40 MG tablet [id number] No Take 1 tablet (40 mg total) by mouth daily. Smitty Cords, DO Taking Active    potassium chloride SA (KLOR-CON M) 20 MEQ tablet [id number] No Take 2 tablets (40 mEq total) by mouth 2 (two) times daily. Creig Hines, NP Taking Active    QUEtiapine (SEROQUEL) 25 MG tablet [id number]   Take 0.5-1 tablets (12.5-25 mg total) by  mouth at bedtime. Mecum, Oswaldo Conroy, PA-C   Active    sertraline (ZOLOFT) 100 MG tablet [id number] No Take 1 tablet (100 mg total) by mouth daily. Please schedule an office visit before anymore refills. Smitty Cords, DO Taking Active    SUPER B COMPLEX/C PO [id number] No Take 1 tablet by mouth daily. [provider] Taking Active Child  traZODone (DESYREL) 100 MG tablet [id number] No Take 1 tablet (100 mg total) by mouth at bedtime. Smitty Cords, DO Taking Active    TRELEGY ELLIPTA 100-62.5-25 MCG/INH AEPB [id number] No Inhale 1 puff into the lungs daily. [provider] Taking Active Self, Child                  Home Care and Equipment/Supplies: Were Home Health Services Ordered?: No (to be determined at provider visit currently  at) Any new equipment or medical supplies ordered?: Yes Name of Medical supply agency?: received a shoulder immoblizer for her left shoulder Were you able to get the equipment/medical supplies?: Yes Do you have any questions related to the use of the equipment/supplies?: No   Functional Questionnaire: Do you need assistance with bathing/showering or dressing?: Yes (daughter assist and takes care of her needs) Do you need assistance with meal preparation?: Yes (daughter assist in her care) Do you need assistance with eating?: No Do you have difficulty maintaining continence: No Do you need assistance with getting out of bed/getting out of a chair/moving?: Yes (the daughter assists with caring for the patient) Do you have difficulty managing or taking your medications?: No   Follow up appointments reviewed: PCP Follow-up appointment confirmed?: Yes Date of PCP follow-up appointment?: 09/16/22 Follow-up Provider: Dr. Saralyn Pilar Specialist [facility] Follow-up appointment confirmed?: Yes Date of Specialist follow-up appointment?: 09/13/22 Follow-Up Specialty Provider:: G Depoe Do you need transportation to your  follow-up appointment?: No Do you understand care options if your condition(s) worsen?: Yes-patient verbalized understanding       Alto Denver RN, MSN, CCM RN Care Manager  [place]  Ambulatory Care Management  Direct Number: [phone]   Our next appointment is by telephone on 09-17-2022 at 1145 am  Please call the care guide team at [phone] if you need to cancel or reschedule your appointment.   If you are experiencing a Mental Health or Behavioral Health Crisis or need someone to talk to, please call the Suicide and Crisis Lifeline: 988 call the [place] National Suicide Prevention Lifeline: (206)417-1774 or TTY: (408)057-8601 TTY 4258460115) to talk to a trained counselor call 1-800-273-TALK (toll free, 24 hour hotline)   Patient verbalizes understanding of instructions and care plan provided today and agrees to view in MyChart. Active MyChart status and patient understanding of how to access instructions and care plan via MyChart confirmed with patient.       Alto Denver RN, MSN, CCM RN Care Manager  [facility]  Ambulatory Care Management  Direct Number: [phone]

## 2022-09-13 NOTE — Transitions of Care (Post Inpatient/ED Visit) (Signed)
09/13/2022  Name: Jenna [name] MRN: [id number] DOB: 1935-06-15  Today's TOC FU Call Status: Today's TOC FU Call Status:: Successful TOC FU Call Completed TOC FU Call Complete Date: 09/10/22 Patient's Name and Date of Birth confirmed.  Transition Care Management Follow-up Telephone Call Date of Discharge: 09/10/22 Discharge Facility: [facility]) Type of Discharge: Emergency Department Reason for ED Visit: Orthopedic Conditions Orthopedic/Injury Diagnosis: Fracture (fall with fracture to left shoulder) How have you been since you were released from the hospital?: Same Any questions or concerns?: No  Items Reviewed: Did you receive and understand the discharge instructions provided?: Yes Medications obtained,verified, and reconciled?: No (patients daughter was in the orthopedic provider office with the patient being seen after her fall) Medications Not Reviewed Reasons:: Other: (The patient was at the orthopedic provider being seen and the daughter ask for a call back next week) Any new allergies since your discharge?: No Dietary orders reviewed?: No Do you have support at home?: Yes People in Home: child(ren), adult Name of Support/Comfort Primary Source: Bari Edward, daughter  Medications Reviewed Today: Medications Reviewed Today     Reviewed by Marlowe Sax, RN (Case Manager) on 09/13/22 at 1531  Med List Status: <None>   Medication Order Taking? Sig Documenting Provider Last Dose Status Informant  albuterol (PROVENTIL) (2.5 MG/3ML) 0.083% nebulizer solution [id number] No Take 3 mLs (2.5 mg total) by nebulization every 6 (six) hours as needed for wheezing or shortness of breath. Alford Highland, MD Taking Expired 05/21/22 2359 Child  albuterol (VENTOLIN HFA) 108 (90 Base) MCG/ACT inhaler [id number] No Inhale 2 puffs into the lungs every 6 (six) hours as needed for wheezing or shortness of breath. Smitty Cords, DO Taking Expired  03/27/22 2359   alendronate (FOSAMAX) 70 MG tablet [id number] No Take 1 tablet (70 mg total) by mouth once a week. Take with a full glass of water on an empty stomach. Smitty Cords, DO Taking Active Child  apixaban (ELIQUIS) 5 MG TABS tablet [id number] No Take 1 tablet (5 mg total) by mouth 2 (two) times daily. Creig Hines, NP Taking Active   Ascorbic Acid (VITAMIN C) 1000 MG tablet [id number] No Take 500 mg by mouth daily.  [provider] Taking Active Self, Child           Med Note Debroah Loop, ADRIENNE E   Fri Sep 22, 2015  1:41 PM)    atorvastatin (LIPITOR) 20 MG tablet [id number] No Take 1 tablet (20 mg total) by mouth at bedtime. Smitty Cords, DO Taking Active   busPIRone (BUSPAR) 5 MG tablet [id number] No Take 1 tablet (5 mg total) by mouth 3 (three) times daily as needed (anxiety). Smitty Cords, DO Taking Active Child  Cholecalciferol 25 MCG (1000 UT) tablet [id number] No Take 1,000 Units by mouth daily.  [provider] Taking Active Self, Child           Med Note Debroah Loop, ADRIENNE E   Fri Sep 22, 2015  1:41 PM)    diltiazem (CARDIZEM CD) 240 MG 24 hr capsule [id number] No Take 1 capsule (240 mg total) by mouth daily. Creig Hines, NP Taking Active   empagliflozin (JARDIANCE) 10 MG TABS tablet [id number] No Take 1 tablet (10 mg total) by mouth daily before breakfast. Delma Freeze, FNP Taking Active   famotidine (PEPCID) 20 MG tablet [id number] No Take 20 mg by mouth 2 (two) times daily. [provider] Taking Active Self, Child  ferrous  sulfate 325 (65 FE) MG EC tablet [id number] No Take 325 mg by mouth at bedtime.  [provider] Taking Active Self, Child           Med Note (ARNOLD, ADRIENNE E   Fri Sep 22, 2015  1:40 PM)    furosemide (LASIX) 20 MG tablet [id number] No Take 1 tablet (20 mg total) by mouth daily as needed for fluid or edema. Take lasix daily, with extra dose as needed for swelling  Smitty Cords, DO Taking Active Child  gabapentin (NEURONTIN) 300 MG capsule [id number] No Take 1 capsule (300 mg total) by mouth at bedtime. Please schedule an office visit before anymore refills. Lorre Munroe, NP Taking Active   ipratropium-albuterol (DUONEB) 0.5-2.5 (3) MG/3ML SOLN [id number] No Take 3 mLs by nebulization every 4 (four) hours as needed. Smitty Cords, DO Taking Active Child  methocarbamol (ROBAXIN) 500 MG tablet [id number] No Take 1 tablet (500 mg total) by mouth every 8 (eight) hours as needed for muscle spasms. Smitty Cords, DO Taking Active Child  metoprolol tartrate (LOPRESSOR) 25 MG tablet [id number] No Take 1 tablet (25 mg total) by mouth 2 (two) times daily as needed. Antonieta Iba, MD Taking Expired 06/10/22 2359   Multiple Vitamins-Minerals (ONE-A-DAY WOMENS PETITES) TABS [id number] No Take 1 tablet by mouth every morning. [provider] Taking Active Self, Child  ondansetron (ZOFRAN-ODT) 4 MG disintegrating tablet [id number]  Take 1 tablet (4 mg total) by mouth every 8 (eight) hours as needed for nausea or vomiting. Menshew, Charlesetta Ivory, PA-C  Active   oxyCODONE-acetaminophen (PERCOCET) 5-325 MG tablet [id number]  Take 1 tablet by mouth every 6 (six) hours as needed for up to 5 days for severe pain. Menshew, Charlesetta Ivory, PA-C  Active   pantoprazole (PROTONIX) 40 MG tablet [id number] No Take 1 tablet (40 mg total) by mouth daily. Smitty Cords, DO Taking Active   potassium chloride SA (KLOR-CON M) 20 MEQ tablet [id number] No Take 2 tablets (40 mEq total) by mouth 2 (two) times daily. Creig Hines, NP Taking Active   QUEtiapine (SEROQUEL) 25 MG tablet [id number]  Take 0.5-1 tablets (12.5-25 mg total) by mouth at bedtime. Mecum, Oswaldo Conroy, PA-C  Active   sertraline (ZOLOFT) 100 MG tablet [id number] No Take 1 tablet (100 mg total) by mouth daily. Please schedule an office visit before anymore refills. Smitty Cords, DO Taking Active   SUPER B COMPLEX/C PO [id number] No Take 1 tablet by mouth daily. [provider] Taking Active Child  traZODone (DESYREL) 100 MG tablet [id number] No Take 1 tablet (100 mg total) by mouth at bedtime. Smitty Cords, DO Taking Active   TRELEGY ELLIPTA 100-62.5-25 MCG/INH AEPB [id number] No Inhale 1 puff into the lungs daily. [provider] Taking Active Self, Child            Home Care and Equipment/Supplies: Were Home Health Services Ordered?: No (to be determined at provider visit currently at) Any new equipment or medical supplies ordered?: Yes Name of Medical supply agency?: received a shoulder immoblizer for her left shoulder Were you able to get the equipment/medical supplies?: Yes Do you have any questions related to the use of the equipment/supplies?: No  Functional Questionnaire: Do you need assistance with bathing/showering or dressing?: Yes (daughter assist and takes care of her needs) Do you need assistance with meal preparation?: Yes (daughter assist in her care) Do you need assistance with eating?: No Do you  have difficulty maintaining continence: No Do you need assistance with getting out of bed/getting out of a chair/moving?: Yes (the daughter assists with caring for the patient) Do you have difficulty managing or taking your medications?: No  Follow up appointments reviewed: PCP Follow-up appointment confirmed?: Yes Date of PCP follow-up appointment?: 09/16/22 Follow-up Provider: Dr. Saralyn Pilar Specialist [facility] Follow-up appointment confirmed?: Yes Date of Specialist follow-up appointment?: 09/13/22 Follow-Up Specialty Provider:: G Depoe Do you need transportation to your follow-up appointment?: No Do you understand care options if your condition(s) worsen?: Yes-patient verbalized understanding    Alto Denver RN, MSN, CCM RN Care Manager  [facility]  Ambulatory Care Management  Direct  Number: [phone]

## 2022-09-16 ENCOUNTER — Telehealth (INDEPENDENT_AMBULATORY_CARE_PROVIDER_SITE_OTHER): Payer: Medicare Other | Admitting: Family Medicine

## 2022-09-16 ENCOUNTER — Encounter: Payer: Self-pay | Admitting: Family Medicine

## 2022-09-16 VITALS — Ht 61.0 in

## 2022-09-16 DIAGNOSIS — S4292XA Fracture of left shoulder girdle, part unspecified, initial encounter for closed fracture: Secondary | ICD-10-CM

## 2022-09-16 DIAGNOSIS — R413 Other amnesia: Secondary | ICD-10-CM | POA: Diagnosis not present

## 2022-09-16 DIAGNOSIS — R296 Repeated falls: Secondary | ICD-10-CM

## 2022-09-16 DIAGNOSIS — F5104 Psychophysiologic insomnia: Secondary | ICD-10-CM | POA: Diagnosis not present

## 2022-09-16 NOTE — Patient Instructions (Addendum)
Thank you for coming to the office today.  Referral to Neurology for Memory Loss.  [facility] - Neurology Dept [address] [place], [place] [address] Phone: [phone]  Keep up with Home PT   Durable Medical Equipment location  Check into these options and w/ insurance to find a place in network, and find out which specs you need on the hospital bed. Message or call back w/ info, can share info on bed w/ Pam and I can order it when ready.  [facility] [address] [place], [place]  [address] Ph: [phone] Fax: [phone]  [facility] [address] [place], [place] [address] Ph: [phone] Fax: [phone]  [facility] [address] [place], [place] [address] Open until 5PM Phone: [phone] Fax: [phone]  Please schedule a Follow-up Appointment to: Return if symptoms worsen or fail to improve.  If you have any other questions or concerns, please feel free to call the office or send a message through MyChart. You may also schedule an earlier appointment if necessary.  Additionally, you may be receiving a survey about your experience at our office within a few days to 1 week by e-mail or mail. We value your feedback.  Saralyn Pilar, DO [facility] [place]

## 2022-09-16 NOTE — Progress Notes (Addendum)
Subjective:    Patient ID: Jenna [name], female    DOB: 10-Feb-1935, 87 y.o.   MRN: [id number]  Jenna [name] is a 87 y.o. female presenting on 09/16/2022 for Fall (X 6 days ago, foot got caught, and fell on her left shoulder, xrays done, compression fracture, knees gave out one day, pt uses a wheelchair ) and Memory Loss (Wants referral )  Virtual / Telehealth Encounter - Video Visit via MyChart The purpose of this virtual visit is to provide medical care while limiting exposure to the novel coronavirus (COVID19) for both patient and office staff.  Consent was obtained for remote visit:  Yes.   Answered questions that patient had about telehealth interaction:  Yes.   I discussed the limitations, risks, security and privacy concerns of performing an evaluation and management service by video/telephone. I also discussed with the patient that there may be a patient responsible charge related to this service. The patient expressed understanding and agreed to proceed.  Patient Location: Home Provider Location: [facility] (Office)  Participants in virtual visit: - Patient: Jenna [name] - CMA: Madelynn Done CMA - Provider: Dr Althea Charon   HPI  ED Follow-up 09/10/22 [facility] Fall injury broke L upper arm 6 days ago, [facility] by [facility] 09/13/22, Dr [facility] PT for shoulder already ordered Also issue with knees giving way She has been using a different type of wheelchair, transport chair with folding back, daughter helping her get around  Hospital visit following July 2024, she has had some decline with memory Within past week difficulty with memory loss, short term and recall Disorientation occasionally  Requesting referral to Neurology for cognitive evaluation Memory loss >1 year, then slowly getting worse  Medication changed She had 5 days of seroquel and then off, it was trial in hospital, ineffective Ambien not working New med is oxycodone + zofran  She has  not had problem with pain medicine before. It does not seem to be worsening memory  Mood is still depressed, inc stressors with husband passing within past week.  On Sertraline 100mg , Trazodone, Seroquel  Working with Alto Denver on call CCM RN  She is going to start Home PT through Orthopedics  Requesting Home DME Hospital Bed, adjustable, lower and raise, they will check specs and coverage     09/16/2022   11:24 AM 06/28/2022    2:21 PM 10/15/2021    3:17 PM  Depression screen PHQ 2/9  Decreased Interest 3 3 0  Down, Depressed, Hopeless 3 3 3   PHQ - 2 Score 6 6 3   Altered sleeping 0 3 1  Tired, decreased energy 3 3 0  Change in appetite 3 3 0  Feeling bad or failure about yourself  0 0 0  Trouble concentrating 0 0 0  Moving slowly or fidgety/restless 0 3 0  Suicidal thoughts 0 0 0  PHQ-9 Score 12 18 4   Difficult doing work/chores Not difficult at all Very difficult Not difficult at all    Social History   Tobacco Use   Smoking status: Former    Current packs/day: 0.00    Average packs/day: 1 pack/day for 20.0 years (20.0 ttl pk-yrs)    Types: Cigarettes    Start date: 07/1961    Quit date: 07/1981    Years since quitting: 41.1   Smokeless tobacco: Former  Building services engineer status: Never Used  Substance Use Topics   Alcohol use: Not Currently    Comment:  past   Drug use: No    Review of Systems Per HPI unless specifically indicated above     Objective:    Ht 5\' 1"  (1.549 m)   BMI 33.82 kg/m   Wt Readings from Last 3 Encounters:  09/10/22 179 lb (81.2 kg)  08/23/22 179 lb (81.2 kg)  06/28/22 190 lb (86.2 kg)    Physical Exam  Note examination was completely remotely via video observation objective data only  Gen - well-appearing, obese, elderly 87 yr female, appears comfortable currently HEENT - eyes appear clear without discharge or redness Heart/Lungs - cannot examine virtually - observed no evidence of coughing or labored breathing. Abd - cannot  examine virtually  Skin - face visible today- no rash Neuro - awake, alert, oriented Psych - not anxious appearing, able to converse normally during history   Results for orders placed or performed in visit on 06/28/22  POCT glycosylated hemoglobin (Hb A1C)  Result Value Ref Range   Hemoglobin A1C 6.9 (A) 4.0 - 5.6 %   HbA1c POC (<> result, manual entry)     HbA1c, POC (prediabetic range)     HbA1c, POC (controlled diabetic range)        Assessment & Plan:   Problem List Items Addressed This Visit     Psychophysiological insomnia   Other Visit Diagnoses     Memory loss    -  Primary   Relevant Orders   Ambulatory referral to Neurology   Falls frequently       Closed fracture of left shoulder, initial encounter           No orders of the defined types were placed in this encounter.  L [place] fracture HFU / ED Seen by Ron[facility]e [facility]ts now 8/23, starting [facility] PT regimen On pain medicine and currently has assistance with daughter and using transport wheelchair in the home  Memory loss, chronic gradual Factors with depression recurrent, recent husband passing and insomnia, chronic problem.   referral to Neurology for consultation on chronic memory loss >1 year with forgetful, short term recall, and disorientation confusion. Worse with multiple other health issues, medications, poor sleep, mood, husband passing. Requesting Neuro eval for reversible and identifiable causes of memory loss cognitive decline  Orders Placed This Encounter  Procedures   Ambulatory referral to Neurology    Referral Priority:   Routine    Referral Type:   Consultation    Referral Reason:   Specialty Services Required    Requested Specialty:   Neurology    Number of Visits Requested:   1    Route chart to [facility]  Follow up plan: Return if symptoms worsen or fail to improve.   Patient verbalizes understanding with the above medical recommendations including the limitation of remote medical  advice.  Specific follow-up and call-back criteria were given for patient to follow-up or seek medical care more urgently if needed.  Total duration of direct patient care provided via video conference: 15 minutes     Saralyn Pilar, DO [facility] 09/16/2022, 11:34 AM

## 2022-09-17 ENCOUNTER — Other Ambulatory Visit: Payer: Self-pay

## 2022-09-17 ENCOUNTER — Other Ambulatory Visit: Payer: Medicare Other

## 2022-09-17 NOTE — Patient Instructions (Addendum)
Visit Information  Thank you for taking time to visit with me today. Please don't hesitate to contact me if I can be of assistance to you before our next scheduled telephone appointment.  Following are the goals we discussed today:   Goals Addressed             This Visit's Progress    RNCM Effective management of Chronic Disease Management: DM, Depression, COPD, Memory Loss, and Chronic Pain       Current Barriers:  Knowledge Deficits related to plan of care for management of COPD, DMII, Chronic Pain, and Depression and Memory loss  Care Coordination needs related to Cognitive Deficits, Memory Deficits, Inability to perform ADL's independently, and Inability to perform IADL's independently  Chronic Disease Management support and education needs related to COPD, DMII, Chronic Pain, and memory loss, depression Cognitive Deficits Falls Loss of husband of 47 years on 08-25-2022  RNCM Clinical Goal(s):  Patient will verbalize basic understanding of COPD, DMII, and Chronic pain, memory changes, and depression disease process and self health management plan as evidenced by stable conditions, no acute exacerbations of conditions, working with the team for effective management of chronic conditions and disease progression. take all medications exactly as prescribed and will call provider for medication related questions as evidenced by compliance with medications    continue to work with RN Care Manager and/or Social Worker to address care management and care coordination needs related to COPD, DMII, and chronic pain, depression, and memory loss as evidenced by adherence to CM Team Scheduled appointments     demonstrate a decrease in COPD, DMII, and Chronic pain, memory loss, and depression exacerbations   as evidenced by effective management of chronic conditions and working with the team to recognize changes impacting the patient for quick assessment and intervention  through collaboration with Research officer, trade union, provider, and care team.   Interventions: Evaluation of current treatment plan related to  self management and patient's adherence to plan as established by provider   COPD: (Status: New goal. Goal on Track (progressing): YES.) Long Term Goal  Reviewed medications with patient, including use of prescribed maintenance and rescue inhalers, and provided instruction on medication management and the importance of adherence Provided patient with basic written and verbal COPD education on self care/management/and exacerbation prevention Advised patient to track and manage COPD triggers Provided written and verbal instructions on pursed lip breathing and utilized returned demonstration as teach back Provided instruction about proper use of medications used for management of COPD including inhalers Advised patient to self assesses COPD action plan zone and make appointment with provider if in the yellow zone for 48 hours without improvement Advised patient to engage in light exercise as tolerated 3-5 days a week to aid in the the management of COPD Provided education about and advised patient to utilize infection prevention strategies to reduce risk of respiratory infection Discussed the importance of adequate rest and management of fatigue with COPD Screening for signs and symptoms of depression related to chronic disease state  Assessed social determinant of health barriers  Diabetes:  (Status: New goal. Goal on Track (progressing): YES.) Long Term Goal   Lab Results  Component Value Date   HGBA1C 6.9 (A) 06/28/2022    Assessed patient's understanding of A1c goal: <7% Provided education to patient about basic DM disease process; Reviewed medications with patient and discussed importance of medication adherence;        Reviewed prescribed diet with patient heart healthy/ADA diet,  the patient has not been eating well per her daughter. Discussed protein drinks and also will attach a  recipe that has high protein levels in it. : Recipe to Help patients who have unintentional weight loss In a blender mix: 8 ounces of whole milk 1 pkt of Carnation instant breakfast ( vanilla works well 1 package of egg beaters 2 scoops of ice cream Fruit of your choice (bananna works well) This makes 3 to 4- 8 ounce cups. Can cover and keep in fridge or freeze  286 Calories 15 g of protein 41 g of carbs 8 g of fat ; Counseled on importance of regular laboratory monitoring as prescribed;        Discussed plans with patient for ongoing care management follow up and provided patient with direct contact information for care management team;      Provided patient with written educational materials related to hypo and hyperglycemia and importance of correct treatment;       Reviewed scheduled/upcoming provider appointments including: saw pcp on 09-16-2022 by video visit;         call provider for findings outside established parameters;       Referral made to social work team for assistance with review of resources available for the patient. Her daughter is living with her and her primary caregiver at this time due to decline in health and well being;      Review of patient status, including review of consultants reports, relevant laboratory and other test results, and medications completed;       Advised patient to discuss acute changes in her DM health and well being, questions or concerns with provider;      Screening for signs and symptoms of depression related to chronic disease state;        Assessed social determinant of health barriers;         Depression and Memory loss  (Status: New goal. Goal on Track (progressing): YES.) Long Term Goal  Evaluation of current treatment plan related to Depression and memory loss , Cognitive Deficits, Memory Deficits, Inability to perform ADL's independently, and Inability to perform IADL's independently self-management and patient's adherence to plan as  established by provider. Discussed plans with patient for ongoing care management follow up and provided patient with direct contact information for care management team Advised patient to call the office for acute changes in the patients mental health, anxiety, depression, memory loss. The patient has had a decline in her memory and also just recently experienced the death of her husband Gaspar Garbe of 47 years. Her daughter states that she has seen further decline in the patients memory and forgetfulness; Provided education to patient re: the book resource: The 36 hour day for resource for helping understand memory changes and patients with memory loss/dementia. Also gave information on Arelia Sneddon and her work with dementia as resources to help understand changes the patients daughter is seeing in the patient. ; Reviewed medications with patient and discussed compliance. The patient is compliant with medications. Her daughter is assisting her; Collaborated with pcp regarding request for hospital bed for the patient. Discussed talking to the insurance company. Also have information for DME suppliers and the [employer]: [phone]- Etheleen Nicks- contact person; Provided patient with information about memory loss and resources available educational materials related to changes in memory and decline; Reviewed scheduled/upcoming provider appointments including saw the pcp by video visit yesterday. Knows to call for changes; Social Work referral for resources if needed; Discussed  plans with patient for ongoing care management follow up and provided patient with direct contact information for care management team; Advised patient to discuss changes in memory, acute changes and decline in condition, DME needs, questions, and concerns with provider; Screening for signs and symptoms of depression related to chronic disease state;  Assessed social determinant of health barriers;   Pain:  (Status: New goal.  Goal on Track (progressing): YES.) Long Term Goal  Pain assessment performed Medications reviewed Reviewed provider established plan for pain management; Discussed importance of adherence to all scheduled medical appointments; Counseled on the importance of reporting any/all new or changed pain symptoms or management strategies to pain management provider; Advised patient to report to care team affect of pain on daily activities; Discussed use of relaxation techniques and/or diversional activities to assist with pain reduction (distraction, imagery, relaxation, massage, acupressure, TENS, heat, and cold application; Reviewed with patient prescribed pharmacological and nonpharmacological pain relief strategies; Advised patient to discuss changes in level or intensity of pain, new concerns or questions, new PT needs with provider; Screening for signs and symptoms of depression related to chronic disease state;  Assessed social determinant of health barriers;   Patient Goals/Self-Care Activities: Take medications as prescribed   Attend all scheduled provider appointments Call pharmacy for medication refills 3-7 days in advance of running out of medications Call provider office for new concerns or questions  Work with the social worker to address care coordination needs and will continue to work with the clinical team to address health care and disease management related needs Work with the pharmacist to address medication management needs and will continue to work with the clinical team to address health care and disease management related needs call the Suicide and Crisis Lifeline: 988 call the [place] National Suicide Prevention Lifeline: (867)568-2503 or TTY: 279-301-9698 TTY 8488035282) to talk to a trained counselor call 1-800-273-TALK (toll free, 24 hour hotline) if experiencing a Mental Health or Behavioral Health Crisis  check feet daily for cuts, sores or redness manage portion  size keep feet up while sitting wash and dry feet carefully every day wear comfortable, cotton socks wear comfortable, well-fitting shoes avoid second hand smoke eliminate smoking in my home identify and remove indoor air pollutants limit outdoor activity during cold weather listen for public air quality announcements every day develop a rescue plan eliminate symptom triggers at home           Our next appointment is by telephone on 10-16-2022 at 1145 am  Please call the care guide team at [phone] if you need to cancel or reschedule your appointment.   If you are experiencing a Mental Health or Behavioral Health Crisis or need someone to talk to, please call the Suicide and Crisis Lifeline: 988 call the [place] National Suicide Prevention Lifeline: 580-690-4864 or TTY: 205-428-8006 TTY 669-454-3638) to talk to a trained counselor call 1-800-273-TALK (toll free, 24 hour hotline)      Ms. Sapia was given information about Care Management services by the embedded care coordination team including:  Care Management services include personalized support from designated clinical staff supervised by her physician, including individualized plan of care and coordination with other care providers 24/7 contact phone numbers for assistance for urgent and routine care needs. The patient may stop CCM services at any time (effective at the end of the month) by phone call to the office staff.  Patient agreed to services and verbal consent obtained.   Patient verbalizes understanding of instructions and care  plan provided today and agrees to view in MyChart. Active MyChart status and patient understanding of how to access instructions and care plan via MyChart confirmed with patient.     Telephone follow up appointment with care management team member scheduled for: 10-16-2022 at 1145 am  Alto Denver RN, MSN, CCM RN Care Manager  [facility]  Ambulatory Care Management  Direct Number:  [phone]   [facility] [address] [place], [place]  [address] Ph: [phone] Fax: [phone]   [facility]. [address] [place], [place] [address] Ph: [phone] Fax: [phone]   [facility] [address] [place], [place] [address] Open until 5PM Phone: [phone] Fax: [phone]  Dementia Caregiver Guide Dementia is a condition that affects the way the brain works. It often affects thinking and memory. A person with dementia may: Forget things. Have trouble talking or responding to your questions. Have trouble paying attention. Have trouble thinking clearly and making good decisions. Get lost or wander away from home or other places. Have big changes in their mood or emotions. They may: Feel very worried, nervous, or depressed. Have angry outbursts. Be suspicious or accuse you of things. Have childlike behavior and language. Taking care of someone with dementia can be a challenge. The tips below can help you care for the person. How to help manage lifestyle changes Dementia usually gets worse slowly over time. In the early stages, people with dementia can stay safe and take care of themselves with some help. In later stages, they need help with daily tasks like getting dressed, grooming, and going to the bathroom. Communicating When the person is talking and seems frustrated, make eye contact and hold the person's hand. Ask questions that can be answered with a yes or no. Use simple words and a calm voice. Only give one direction at a time. Limit choices for the person. Too many choices can be stressful. Avoid correcting the person in a negative way. If the person can't find the right words, gently try to help. Preventing injury  Keep floors clear. Remove rugs, magazine racks, and floor lamps. Keep hallways well lit, especially at night. Put a handrail and nonslip mat in the bathtub or  shower. Put childproof locks on cabinets that have dangerous items in them. These items include medicine, alcohol, guns, cleaning products, and sharp tools. For doors to the outside, put locks where the person can't see or reach them. This helps keep the person from going out of the house and getting lost. Be ready for emergencies. Keep a list of emergency phone numbers and addresses close by. Remove car keys and lock garage doors so the person doesn't try to drive. Have the person wear a bracelet that tracks where they are and shows that they're a person with memory loss. This should be worn at all times for safety. Helping with daily life  Keep the person on track with their daily routine. Try to identify areas where the person may need help. Be supportive, patient, calm, and encouraging. Gently remind the person that adjusting to changes takes time. Help with the tasks that the person has asked for help with. Keep the person involved in daily tasks and decisions as much as you can. Encourage conversation, but try not to get frustrated if the person struggles to find words or doesn't seem to appreciate your help. Other tips Think about any safety risks and take steps to avoid them. Keep things organized:  Organize medicines in a pill box for each day of the week. Keep a calendar in a central place. Use it to remind the person of health care visits or other activities. Create a plan to handle any legal or financial matters. Get help from a professional if needed. Help make sure the person: Takes medicines only as told by their health care providers. Eats regular, healthy meals. They should also drink plenty of fluids. Goes to all scheduled health care appointments. Gets regular sleep. Taking care of yourself Being a caregiver for someone with dementia can be hard. You may feel stressed and have many other emotions. It's important to also take care of yourself. Here are some tips: Find out  about services that can provide short-term care for the person. This is called respite care. It can allow you to take a break when you need one. Find healthy ways to deal with stress. Some ways include: Spending time with other people. Exercising. Meditating or doing deep breathing exercises. Take care of your own health by: Getting enough sleep. Eating healthy foods. Getting regular exercise. Join a support group with others who are caregivers. These groups can help you: Learn other ways to deal with stress. Share experiences with others. Get emotional comfort and support. Learn about caregiving as the disease gets worse. Find resources in your community. Where to find support: Many people and organizations offer support. These include: Support groups for people with dementia. Support groups for caregivers. Counselors or therapists. Home health care services. Adult day care centers. Where to find more information Alzheimer's Association: WesternTunes.it Family Caregiver Alliance: caregiver.org Alzheimer's Foundation of [place]: alzfdn.org Contact a health care provider if: The person's health is quickly getting worse. You're no longer able to care for the person. Caring for the person is affecting your physical and emotional health. You're feeling worried, nervous, or depressed about caring for the person. Get help right away if: You feel like the person may hurt themselves or others. The person has talked about taking their own life. These symptoms may be an emergency. Take one of these steps right away: Go to your nearest emergency room. Call 911. Call the National Suicide Prevention Lifeline at 360-725-9009 or 988. Text the Crisis Text Line at 7136031503. This information is not intended to replace advice given to you by your health care provider. Make sure you discuss any questions you have with your health care provider. Document Revised: 04/19/2022 Document Reviewed:  04/19/2022 Elsevier Patient Education  2024 [employer].

## 2022-09-17 NOTE — Patient Outreach (Signed)
Care Management   Visit Note  09/17/2022 Name: Jenna [name] MRN: [id number] DOB: 07/02/1935  Subjective: Jenna [name] is a 87 y.o. year old female who is a primary care patient of Smitty Cords, DO. The Care Management team was consulted for assistance.      Engaged with patient spoke with the family member (POA, Jenna [name], Jenna [name]).    Goals Addressed             This Visit's Progress    RNCM Effective management of Chronic Disease Management: DM, Depression, COPD, Memory Loss, and Chronic Pain       Current Barriers:  Knowledge Deficits related to plan of care for management of COPD, DMII, Chronic Pain, and Depression and Memory loss  Care Coordination needs related to Cognitive Deficits, Memory Deficits, Inability to perform ADL's independently, and Inability to perform IADL's independently  Chronic Disease Management support and education needs related to COPD, DMII, Chronic Pain, and memory loss, depression Cognitive Deficits Falls Loss of husband of 47 years on 08-25-2022  RNCM Clinical Goal(s):  Patient will verbalize basic understanding of COPD, DMII, and Chronic pain, memory changes, and depression disease process and self health management plan as evidenced by stable conditions, no acute exacerbations of conditions, working with the team for effective management of chronic conditions and disease progression. take all medications exactly as prescribed and will call provider for medication related questions as evidenced by compliance with medications    continue to work with RN Care Manager and/or Social Worker to address care management and care coordination needs related to COPD, DMII, and chronic pain, depression, and memory loss as evidenced by adherence to CM Team Scheduled appointments     demonstrate a decrease in COPD, DMII, and Chronic pain, memory loss, and depression exacerbations   as evidenced by effective management of chronic conditions and working  with the team to recognize changes impacting the patient for quick assessment and intervention  through collaboration with Medical illustrator, provider, and care team.   Interventions: Evaluation of current treatment plan related to  self management and patient's adherence to plan as established by provider   COPD: (Status: New goal. Goal on Track (progressing): YES.) Long Term Goal  Reviewed medications with patient, including use of prescribed maintenance and rescue inhalers, and provided instruction on medication management and the importance of adherence Provided patient with basic written and verbal COPD education on self care/management/and exacerbation prevention Advised patient to track and manage COPD triggers Provided written and verbal instructions on pursed lip breathing and utilized returned demonstration as teach back Provided instruction about proper use of medications used for management of COPD including inhalers Advised patient to self assesses COPD action plan zone and make appointment with provider if in the yellow zone for 48 hours without improvement Advised patient to engage in light exercise as tolerated 3-5 days a week to aid in the the management of COPD Provided education about and advised patient to utilize infection prevention strategies to reduce risk of respiratory infection Discussed the importance of adequate rest and management of fatigue with COPD Screening for signs and symptoms of depression related to chronic disease state  Assessed social determinant of health barriers  Diabetes:  (Status: New goal. Goal on Track (progressing): YES.) Long Term Goal   Lab Results  Component Value Date   HGBA1C 6.9 (A) 06/28/2022    Assessed patient's understanding of A1c goal: <7% Provided education to patient about basic DM disease process; Reviewed medications  with patient and discussed importance of medication adherence;        Reviewed prescribed diet with patient  heart healthy/ADA diet, the patient has not been eating well per her daughter. Discussed protein drinks and also will attach a recipe that has high protein levels in it. : Recipe to Help patients who have unintentional weight loss In a blender mix: 8 ounces of whole milk 1 pkt of Carnation instant breakfast ( vanilla works well 1 package of egg beaters 2 scoops of ice cream Fruit of your choice (bananna works well) This makes 3 to 4- 8 ounce cups. Can cover and keep in fridge or freeze  286 Calories 15 g of protein 41 g of carbs 8 g of fat ; Counseled on importance of regular laboratory monitoring as prescribed;        Discussed plans with patient for ongoing care management follow up and provided patient with direct contact information for care management team;      Provided patient with written educational materials related to hypo and hyperglycemia and importance of correct treatment;       Reviewed scheduled/upcoming provider appointments including: saw pcp on 09-16-2022 by video visit;         call provider for findings outside established parameters;       Referral made to social work team for assistance with review of resources available for the patient. Her daughter is living with her and her primary caregiver at this time due to decline in health and well being;      Review of patient status, including review of consultants reports, relevant laboratory and other test results, and medications completed;       Advised patient to discuss acute changes in her DM health and well being, questions or concerns with provider;      Screening for signs and symptoms of depression related to chronic disease state;        Assessed social determinant of health barriers;         Depression and Memory loss  (Status: New goal. Goal on Track (progressing): YES.) Long Term Goal  Evaluation of current treatment plan related to Depression and memory loss , Cognitive Deficits, Memory Deficits, Inability  to perform ADL's independently, and Inability to perform IADL's independently self-management and patient's adherence to plan as established by provider. Discussed plans with patient for ongoing care management follow up and provided patient with direct contact information for care management team Advised patient to call the office for acute changes in the patients mental health, anxiety, depression, memory loss. The patient has had a decline in her memory and also just recently experienced the death of her husband Jenna [name] of 47 years. Her daughter states that she has seen further decline in the patients memory and forgetfulness; Provided education to patient re: the book resource: The 36 hour day for resource for helping understand memory changes and patients with memory loss/dementia. Also gave information on Arelia Sneddon and her work with dementia as resources to help understand changes the patients daughter is seeing in the patient. ; Reviewed medications with patient and discussed compliance. The patient is compliant with medications. Her daughter is assisting her; Collaborated with pcp regarding request for hospital bed for the patient. Discussed talking to the insurance company. Also have information for DME suppliers and the [employer]: [phone]- Jenna [name]- contact person; Provided patient with information about memory loss and resources available educational materials related to changes in memory and decline; Reviewed scheduled/upcoming  provider appointments including saw the pcp by video visit yesterday. Knows to call for changes; Social Work referral for resources if needed; Discussed plans with patient for ongoing care management follow up and provided patient with direct contact information for care management team; Advised patient to discuss changes in memory, acute changes and decline in condition, DME needs, questions, and concerns with provider; Screening for signs and symptoms of  depression related to chronic disease state;  Assessed social determinant of health barriers;   Pain:  (Status: New goal. Goal on Track (progressing): YES.) Long Term Goal  Pain assessment performed Medications reviewed Reviewed provider established plan for pain management; Discussed importance of adherence to all scheduled medical appointments; Counseled on the importance of reporting any/all new or changed pain symptoms or management strategies to pain management provider; Advised patient to report to care team affect of pain on daily activities; Discussed use of relaxation techniques and/or diversional activities to assist with pain reduction (distraction, imagery, relaxation, massage, acupressure, TENS, heat, and cold application; Reviewed with patient prescribed pharmacological and nonpharmacological pain relief strategies; Advised patient to discuss changes in level or intensity of pain, new concerns or questions, new PT needs with provider; Screening for signs and symptoms of depression related to chronic disease state;  Assessed social determinant of health barriers;   Patient Goals/Self-Care Activities: Take medications as prescribed   Attend all scheduled provider appointments Call pharmacy for medication refills 3-7 days in advance of running out of medications Call provider office for new concerns or questions  Work with the social worker to address care coordination needs and will continue to work with the clinical team to address health care and disease management related needs Work with the pharmacist to address medication management needs and will continue to work with the clinical team to address health care and disease management related needs call the Suicide and Crisis Lifeline: 988 call the [place] National Suicide Prevention Lifeline: 865 374 0319 or TTY: 813-855-7442 TTY 417-339-4995) to talk to a trained counselor call 1-800-273-TALK (toll free, 24 hour hotline) if  experiencing a Mental Health or Behavioral Health Crisis  check feet daily for cuts, sores or redness manage portion size keep feet up while sitting wash and dry feet carefully every day wear comfortable, cotton socks wear comfortable, well-fitting shoes avoid second hand smoke eliminate smoking in my home identify and remove indoor air pollutants limit outdoor activity during cold weather listen for public air quality announcements every day develop a rescue plan eliminate symptom triggers at home             Consent to Services:  Patient was given information about care management services, agreed to services, and gave verbal consent to participate.   Plan: Telephone follow up appointment with care management team member scheduled for: 10-16-2022 at 1145 am  Alto Denver RN, MSN, CCM RN Care Manager  [facility]  Ambulatory Care Management  Direct Number: [phone]

## 2022-09-20 DIAGNOSIS — M25512 Pain in left shoulder: Secondary | ICD-10-CM | POA: Diagnosis not present

## 2022-09-20 DIAGNOSIS — S42292D Other displaced fracture of upper end of left humerus, subsequent encounter for fracture with routine healing: Secondary | ICD-10-CM | POA: Diagnosis not present

## 2022-09-29 ENCOUNTER — Other Ambulatory Visit: Payer: Self-pay | Admitting: Cardiovascular Disease

## 2022-10-01 NOTE — Progress Notes (Unsigned)
PCP: Primary Cardiologist:  HPI:   Jenna [name] is a 87 y/o female with a history of asthma, DM, hyperlipidemia, CKD, anxiety, GERD, COPD, PSVT, sleep apnea, tobacco use and chronic heart failure.   Echo report from 05/18/21 showed an EF of 60-65% along with mild MR and normal PA Pressure of 26.3 mmHg.   Admitted 12/02/21 due to COPD exacerbation. Given steroids. CXR negative for pneumonia. Needed oxygen and unable to be weaned off. 1 dose of IV lasix given due to worsening edema. PT evaluation done. Discharged after 5 days with steroids and antibiotics. Was in the ED 11/02/21 due to COPD exacerbation where she was treated and released.    She presents today for a HF follow-up visit with a chief complaint of moderate fatigue upon minimal exertion. Describes this as chronic in nature. Has associated SOB, pedal edema (improving), palpitations & light-headedness along with this. Denies any difficulty sleeping, abdominal distention, chest pain, wheezing, cough or weight gain.    ROS: All systems negative except as listed in HPI, PMH and Problem List.  SH:  Social History   Socioeconomic History   Marital status: Widowed    Spouse name: Alferd Wiatrek   Number of children: 7   Years of education: Restaurant manager, fast food    Highest education level: High school graduate  Occupational History   Not on file  Tobacco Use   Smoking status: Former    Current packs/day: 0.00    Average packs/day: 1 pack/day for 20.0 years (20.0 ttl pk-yrs)    Types: Cigarettes    Start date: 07/1961    Quit date: 07/1981    Years since quitting: 41.2   Smokeless tobacco: Former  Building services engineer status: Never Used  Substance and Sexual Activity   Alcohol use: Not Currently    Comment: past   Drug use: No   Sexual activity: Not Currently  Other Topics Concern   Not on file  Social History Narrative   Not on file   Social Determinants of Health   Financial Resource Strain: Low Risk  (07/27/2022)    Received from [facility], [facility]   Overall Financial Resource Strain (CARDIA)    Difficulty of Paying Living Expenses: Not very hard  Food Insecurity: No Food Insecurity (07/27/2022)   Received from [facility], [facility]   Hunger Vital Sign    Worried About Running Out of Food in the Last Year: Never true    Ran Out of Food in the Last Year: Never true  Transportation Needs: No Transportation Needs (07/30/2022)   Received from [facility]   PRAPARE - Transportation    In the past 12 months, has lack of transportation kept you from medical appointments or from getting medications?: No    Lack of Transportation (Non-Medical): No  Physical Activity: Inactive (07/27/2022)   Received from [facility], [facility]   Exercise Vital Sign    Days of Exercise per Week: 0 days    Minutes of Exercise per Session: 0 min  Stress: Stress Concern Present (07/27/2022)   Received from [facility], [facility] of Occupational Health - Occupational Stress Questionnaire    Feeling of Stress : To some extent  Social Connections: Moderately Integrated (07/27/2022)   Received from [facility], [facility]   Social Connection and Isolation Panel [NHANES]    Frequency of Communication with Friends and Family:  More than three times a week    Frequency of Social Gatherings with Friends and Family: Once a week    Attends Religious Services: More than 4 times per year    Active Member of [employer] or Organizations: No    Attends Banker Meetings: Never    Marital Status: Married  Catering manager Violence: Not At Risk (07/27/2022)   Received from [facility], [facility]   Humiliation, Afraid, Rape, and Kick questionnaire    Fear of Current or Ex-Partner: No    Emotionally Abused: No    Physically Abused: No    Sexually Abused: No    FH:  Family History  Problem Relation Age of Onset   Heart attack Mother     Hypertension Mother    Diabetes type II Mother    Pneumonia Father    Skin cancer Father    Diabetes type II Father    Colon cancer Sister    Ovarian cancer Sister    Heart attack Brother    Heart attack Sister 63   Hyperlipidemia Sister    Hypertension Sister     Past Medical History:  Diagnosis Date   Anxiety    Arthritis    neck, knees(before replacements)   Asthma    Chronic heart failure with preserved ejection fraction (HFpEF) (HCC)    a. 04/2021 Echo: EF 60-65% no rwma, nl RV fxn, RVSP 26.5mmHg, mild MR.   CKD (chronic kidney disease), stage III (HCC)    COPD (chronic obstructive pulmonary disease) (HCC)    Diabetes mellitus without complication (HCC)    Encounter for colonoscopy due to history of adenomatous colonic polyps    GERD (gastroesophageal reflux disease)    Hearing loss of both ears    Hyperlipidemia    Osteoporosis    PAF (paroxysmal atrial fibrillation) (HCC)    a. 12/2021 Zio: Predominantly sinus @ 72. 1% afib burden @ 85-176 bpm. Longest 2h 57m @ 120. Frequent PACs (6.2%); b. CHA2DS2VASc = 6.   Palpitations    PSVT (paroxysmal supraventricular tachycardia)    a. 05/2018 Zio: 99 SVT runs. Fastest 218 x 4:30. Longest 4:38 w/ rate of 172.   Sleep apnea    resolved with gastric bypass   Syncope and collapse    Urine incontinence     Current Outpatient Medications  Medication Sig Dispense Refill   albuterol (PROVENTIL) (2.5 MG/3ML) 0.083% nebulizer solution Take 3 mLs (2.5 mg total) by nebulization every 6 (six) hours as needed for wheezing or shortness of breath. 360 mL 0   albuterol (VENTOLIN HFA) 108 (90 Base) MCG/ACT inhaler Inhale 2 puffs into the lungs every 6 (six) hours as needed for wheezing or shortness of breath. 1 each 3   alendronate (FOSAMAX) 70 MG tablet Take 1 tablet (70 mg total) by mouth once a week. Take with a full glass of water on an empty stomach. 12 tablet 3   apixaban (ELIQUIS) 5 MG TABS tablet Take 1 tablet (5 mg total) by mouth 2  (two) times daily. 60 tablet 6   Ascorbic Acid (VITAMIN C) 1000 MG tablet Take 500 mg by mouth daily.      atorvastatin (LIPITOR) 20 MG tablet Take 1 tablet (20 mg total) by mouth at bedtime. (Patient taking differently: Take 20 mg by mouth daily.) 90 tablet 3   busPIRone (BUSPAR) 5 MG tablet Take 1 tablet (5 mg total) by mouth 3 (three) times daily as needed (anxiety). 90 tablet 1  Cholecalciferol 25 MCG (1000 UT) tablet Take 1,000 Units by mouth daily.      diltiazem (CARDIZEM CD) 240 MG 24 hr capsule Take 1 capsule (240 mg total) by mouth daily. 90 capsule 3   empagliflozin (JARDIANCE) 10 MG TABS tablet Take 1 tablet (10 mg total) by mouth daily before breakfast. 30 tablet 5   famotidine (PEPCID) 20 MG tablet Take 20 mg by mouth 2 (two) times daily.     ferrous sulfate 325 (65 FE) MG EC tablet Take 325 mg by mouth at bedtime.      furosemide (LASIX) 20 MG tablet Take 1 tablet (20 mg total) by mouth daily as needed for fluid or edema. Take lasix daily, with extra dose as needed for swelling 90 tablet 3   gabapentin (NEURONTIN) 300 MG capsule Take 1 capsule (300 mg total) by mouth at bedtime. Please schedule an office visit before anymore refills. 30 capsule 0   ipratropium-albuterol (DUONEB) 0.5-2.5 (3) MG/3ML SOLN Take 3 mLs by nebulization every 4 (four) hours as needed. 720 mL 5   methocarbamol (ROBAXIN) 500 MG tablet Take 1 tablet (500 mg total) by mouth every 8 (eight) hours as needed for muscle spasms. 90 tablet 3   metoprolol tartrate (LOPRESSOR) 25 MG tablet TAKE ONE TABLET BY MOUTH TWICE DAILY AS NEEDED 180 tablet 0   Multiple Vitamins-Minerals (ONE-A-DAY WOMENS PETITES) TABS Take 1 tablet by mouth every morning.     ondansetron (ZOFRAN-ODT) 4 MG disintegrating tablet Take 1 tablet (4 mg total) by mouth every 8 (eight) hours as needed for nausea or vomiting. 15 tablet 0   pantoprazole (PROTONIX) 40 MG tablet Take 1 tablet (40 mg total) by mouth daily. 90 tablet 3   potassium chloride SA  (KLOR-CON M) 20 MEQ tablet Take 2 tablets (40 mEq total) by mouth 2 (two) times daily. 120 tablet 3   sertraline (ZOLOFT) 100 MG tablet Take 1 tablet (100 mg total) by mouth daily. Please schedule an office visit before anymore refills. 90 tablet 3   SUPER B COMPLEX/C PO Take 1 tablet by mouth daily.     traZODone (DESYREL) 100 MG tablet Take 1 tablet (100 mg total) by mouth at bedtime. 90 tablet 3   TRELEGY ELLIPTA 100-62.5-25 MCG/INH AEPB Inhale 1 puff into the lungs daily.     No current facility-administered medications for this visit.    There were no vitals filed for this visit.  PHYSICAL EXAM:  General:  Well appearing. No resp difficulty HEENT: normal Neck: supple. JVP flat. Carotids 2+ bilaterally; no bruits. No lymphadenopathy or thryomegaly appreciated. Cor: PMI normal. Regular rate & rhythm. No rubs, gallops or murmurs. Lungs: clear Abdomen: soft, nontender, nondistended. No hepatosplenomegaly. No bruits or masses. Good bowel sounds. Extremities: no cyanosis, clubbing, rash, edema Neuro: alert & orientedx3, cranial nerves grossly intact. Moves all 4 extremities w/o difficulty. Affect pleasant.   ECG:   ASSESSMENT & PLAN:  1: Chronic heart failure with preserved ejection fraction without structural changes- - NYHA class III - euvolemic today - weighing daily; reminded to call for an overnight weight gain of > 2 pounds or a weekly weight gain of > 5 pounds - weight down 3 pounds from last visit here 1 month ago - jardiance 10mg  daily - furosemide 20mg  PRN - BMP today - not adding salt and she tries to review food labels for sodium content - BNP 12/02/21 was 75.6  2: DM with CKD- - saw PCP Althea Charon) 12/11/21 - BMP 03/27/22 reviewed  and showed sodium 137, potassium 4.1, creatinine 1.21 & GFR 44 - A1c 07/11/21 was 7.2%  3: COPD- - saw pulmonology Meredeth Ide) 03/20/22  4: Paroxsymal SVT- - saw cardiology Mariah Milling) 03/12/22 - diltiazem 240mg  daily - apixaban 5mg   BID - metoprolol tartrate 25mg  BID PRN   Medication list reviewed.   Return in 6 months, sooner if needed.

## 2022-10-02 ENCOUNTER — Ambulatory Visit: Payer: Medicare Other | Attending: Family | Admitting: Family

## 2022-10-02 ENCOUNTER — Encounter: Payer: Self-pay | Admitting: Family

## 2022-10-02 VITALS — BP 148/68 | HR 71 | Wt 179.0 lb

## 2022-10-02 DIAGNOSIS — G4730 Sleep apnea, unspecified: Secondary | ICD-10-CM | POA: Insufficient documentation

## 2022-10-02 DIAGNOSIS — Z9884 Bariatric surgery status: Secondary | ICD-10-CM | POA: Insufficient documentation

## 2022-10-02 DIAGNOSIS — E785 Hyperlipidemia, unspecified: Secondary | ICD-10-CM | POA: Diagnosis not present

## 2022-10-02 DIAGNOSIS — F419 Anxiety disorder, unspecified: Secondary | ICD-10-CM | POA: Insufficient documentation

## 2022-10-02 DIAGNOSIS — N1832 Chronic kidney disease, stage 3b: Secondary | ICD-10-CM | POA: Diagnosis not present

## 2022-10-02 DIAGNOSIS — I3139 Other pericardial effusion (noninflammatory): Secondary | ICD-10-CM | POA: Diagnosis not present

## 2022-10-02 DIAGNOSIS — I130 Hypertensive heart and chronic kidney disease with heart failure and stage 1 through stage 4 chronic kidney disease, or unspecified chronic kidney disease: Secondary | ICD-10-CM | POA: Diagnosis not present

## 2022-10-02 DIAGNOSIS — E669 Obesity, unspecified: Secondary | ICD-10-CM | POA: Diagnosis not present

## 2022-10-02 DIAGNOSIS — I5032 Chronic diastolic (congestive) heart failure: Secondary | ICD-10-CM | POA: Diagnosis not present

## 2022-10-02 DIAGNOSIS — J449 Chronic obstructive pulmonary disease, unspecified: Secondary | ICD-10-CM | POA: Diagnosis not present

## 2022-10-02 DIAGNOSIS — E1122 Type 2 diabetes mellitus with diabetic chronic kidney disease: Secondary | ICD-10-CM

## 2022-10-02 DIAGNOSIS — I471 Supraventricular tachycardia, unspecified: Secondary | ICD-10-CM

## 2022-10-02 DIAGNOSIS — I428 Other cardiomyopathies: Secondary | ICD-10-CM | POA: Insufficient documentation

## 2022-10-02 DIAGNOSIS — J441 Chronic obstructive pulmonary disease with (acute) exacerbation: Secondary | ICD-10-CM | POA: Insufficient documentation

## 2022-10-02 DIAGNOSIS — Z7901 Long term (current) use of anticoagulants: Secondary | ICD-10-CM | POA: Diagnosis not present

## 2022-10-02 DIAGNOSIS — K219 Gastro-esophageal reflux disease without esophagitis: Secondary | ICD-10-CM | POA: Insufficient documentation

## 2022-10-02 DIAGNOSIS — N183 Chronic kidney disease, stage 3 unspecified: Secondary | ICD-10-CM | POA: Diagnosis not present

## 2022-10-02 DIAGNOSIS — I10 Essential (primary) hypertension: Secondary | ICD-10-CM | POA: Diagnosis not present

## 2022-10-02 DIAGNOSIS — Z79899 Other long term (current) drug therapy: Secondary | ICD-10-CM | POA: Diagnosis not present

## 2022-10-02 NOTE — Patient Instructions (Signed)
Take an extra fluid pill today and again on Friday if you feel like you need it.

## 2022-10-06 DIAGNOSIS — Z7951 Long term (current) use of inhaled steroids: Secondary | ICD-10-CM | POA: Diagnosis not present

## 2022-10-06 DIAGNOSIS — M1990 Unspecified osteoarthritis, unspecified site: Secondary | ICD-10-CM | POA: Diagnosis not present

## 2022-10-06 DIAGNOSIS — Z9841 Cataract extraction status, right eye: Secondary | ICD-10-CM | POA: Diagnosis not present

## 2022-10-06 DIAGNOSIS — Z7901 Long term (current) use of anticoagulants: Secondary | ICD-10-CM | POA: Diagnosis not present

## 2022-10-06 DIAGNOSIS — I10 Essential (primary) hypertension: Secondary | ICD-10-CM | POA: Diagnosis not present

## 2022-10-06 DIAGNOSIS — Z9884 Bariatric surgery status: Secondary | ICD-10-CM | POA: Diagnosis not present

## 2022-10-06 DIAGNOSIS — S42292D Other displaced fracture of upper end of left humerus, subsequent encounter for fracture with routine healing: Secondary | ICD-10-CM | POA: Diagnosis not present

## 2022-10-06 DIAGNOSIS — E119 Type 2 diabetes mellitus without complications: Secondary | ICD-10-CM | POA: Diagnosis not present

## 2022-10-06 DIAGNOSIS — Z993 Dependence on wheelchair: Secondary | ICD-10-CM | POA: Diagnosis not present

## 2022-10-06 DIAGNOSIS — Z9089 Acquired absence of other organs: Secondary | ICD-10-CM | POA: Diagnosis not present

## 2022-10-06 DIAGNOSIS — Z96653 Presence of artificial knee joint, bilateral: Secondary | ICD-10-CM | POA: Diagnosis not present

## 2022-10-06 DIAGNOSIS — Z9981 Dependence on supplemental oxygen: Secondary | ICD-10-CM | POA: Diagnosis not present

## 2022-10-06 DIAGNOSIS — E785 Hyperlipidemia, unspecified: Secondary | ICD-10-CM | POA: Diagnosis not present

## 2022-10-06 DIAGNOSIS — Z7984 Long term (current) use of oral hypoglycemic drugs: Secondary | ICD-10-CM | POA: Diagnosis not present

## 2022-10-06 DIAGNOSIS — J4489 Other specified chronic obstructive pulmonary disease: Secondary | ICD-10-CM | POA: Diagnosis not present

## 2022-10-06 DIAGNOSIS — K5790 Diverticulosis of intestine, part unspecified, without perforation or abscess without bleeding: Secondary | ICD-10-CM | POA: Diagnosis not present

## 2022-10-06 DIAGNOSIS — Z556 Problems related to health literacy: Secondary | ICD-10-CM | POA: Diagnosis not present

## 2022-10-06 DIAGNOSIS — G4730 Sleep apnea, unspecified: Secondary | ICD-10-CM | POA: Diagnosis not present

## 2022-10-06 DIAGNOSIS — Z79899 Other long term (current) drug therapy: Secondary | ICD-10-CM | POA: Diagnosis not present

## 2022-10-06 DIAGNOSIS — K219 Gastro-esophageal reflux disease without esophagitis: Secondary | ICD-10-CM | POA: Diagnosis not present

## 2022-10-06 DIAGNOSIS — Z9049 Acquired absence of other specified parts of digestive tract: Secondary | ICD-10-CM | POA: Diagnosis not present

## 2022-10-08 DIAGNOSIS — Z79899 Other long term (current) drug therapy: Secondary | ICD-10-CM | POA: Diagnosis not present

## 2022-10-08 DIAGNOSIS — K5790 Diverticulosis of intestine, part unspecified, without perforation or abscess without bleeding: Secondary | ICD-10-CM | POA: Diagnosis not present

## 2022-10-08 DIAGNOSIS — Z9884 Bariatric surgery status: Secondary | ICD-10-CM | POA: Diagnosis not present

## 2022-10-08 DIAGNOSIS — Z9981 Dependence on supplemental oxygen: Secondary | ICD-10-CM | POA: Diagnosis not present

## 2022-10-08 DIAGNOSIS — Z7951 Long term (current) use of inhaled steroids: Secondary | ICD-10-CM | POA: Diagnosis not present

## 2022-10-08 DIAGNOSIS — K219 Gastro-esophageal reflux disease without esophagitis: Secondary | ICD-10-CM | POA: Diagnosis not present

## 2022-10-08 DIAGNOSIS — M1990 Unspecified osteoarthritis, unspecified site: Secondary | ICD-10-CM | POA: Diagnosis not present

## 2022-10-08 DIAGNOSIS — Z96653 Presence of artificial knee joint, bilateral: Secondary | ICD-10-CM | POA: Diagnosis not present

## 2022-10-08 DIAGNOSIS — G4730 Sleep apnea, unspecified: Secondary | ICD-10-CM | POA: Diagnosis not present

## 2022-10-08 DIAGNOSIS — E119 Type 2 diabetes mellitus without complications: Secondary | ICD-10-CM | POA: Diagnosis not present

## 2022-10-08 DIAGNOSIS — S42292D Other displaced fracture of upper end of left humerus, subsequent encounter for fracture with routine healing: Secondary | ICD-10-CM | POA: Diagnosis not present

## 2022-10-08 DIAGNOSIS — Z7901 Long term (current) use of anticoagulants: Secondary | ICD-10-CM | POA: Diagnosis not present

## 2022-10-08 DIAGNOSIS — Z9089 Acquired absence of other organs: Secondary | ICD-10-CM | POA: Diagnosis not present

## 2022-10-08 DIAGNOSIS — I10 Essential (primary) hypertension: Secondary | ICD-10-CM | POA: Diagnosis not present

## 2022-10-08 DIAGNOSIS — Z7984 Long term (current) use of oral hypoglycemic drugs: Secondary | ICD-10-CM | POA: Diagnosis not present

## 2022-10-08 DIAGNOSIS — Z9841 Cataract extraction status, right eye: Secondary | ICD-10-CM | POA: Diagnosis not present

## 2022-10-08 DIAGNOSIS — Z556 Problems related to health literacy: Secondary | ICD-10-CM | POA: Diagnosis not present

## 2022-10-08 DIAGNOSIS — E785 Hyperlipidemia, unspecified: Secondary | ICD-10-CM | POA: Diagnosis not present

## 2022-10-08 DIAGNOSIS — Z993 Dependence on wheelchair: Secondary | ICD-10-CM | POA: Diagnosis not present

## 2022-10-08 DIAGNOSIS — Z9049 Acquired absence of other specified parts of digestive tract: Secondary | ICD-10-CM | POA: Diagnosis not present

## 2022-10-08 DIAGNOSIS — J4489 Other specified chronic obstructive pulmonary disease: Secondary | ICD-10-CM | POA: Diagnosis not present

## 2022-10-09 ENCOUNTER — Telehealth: Payer: Self-pay | Admitting: Family Medicine

## 2022-10-09 NOTE — Telephone Encounter (Signed)
Called 10/09/2022 to sched AWV - NO VOICEMAIL  Verlee Rossetti; Care Guide Ambulatory Clinical Support [facility] Direct Dial: [phone]

## 2022-10-10 DIAGNOSIS — K219 Gastro-esophageal reflux disease without esophagitis: Secondary | ICD-10-CM | POA: Diagnosis not present

## 2022-10-10 DIAGNOSIS — Z9841 Cataract extraction status, right eye: Secondary | ICD-10-CM | POA: Diagnosis not present

## 2022-10-10 DIAGNOSIS — Z993 Dependence on wheelchair: Secondary | ICD-10-CM | POA: Diagnosis not present

## 2022-10-10 DIAGNOSIS — Z79899 Other long term (current) drug therapy: Secondary | ICD-10-CM | POA: Diagnosis not present

## 2022-10-10 DIAGNOSIS — J4489 Other specified chronic obstructive pulmonary disease: Secondary | ICD-10-CM | POA: Diagnosis not present

## 2022-10-10 DIAGNOSIS — Z7951 Long term (current) use of inhaled steroids: Secondary | ICD-10-CM | POA: Diagnosis not present

## 2022-10-10 DIAGNOSIS — S42292D Other displaced fracture of upper end of left humerus, subsequent encounter for fracture with routine healing: Secondary | ICD-10-CM | POA: Diagnosis not present

## 2022-10-10 DIAGNOSIS — E119 Type 2 diabetes mellitus without complications: Secondary | ICD-10-CM | POA: Diagnosis not present

## 2022-10-10 DIAGNOSIS — Z7984 Long term (current) use of oral hypoglycemic drugs: Secondary | ICD-10-CM | POA: Diagnosis not present

## 2022-10-10 DIAGNOSIS — G4730 Sleep apnea, unspecified: Secondary | ICD-10-CM | POA: Diagnosis not present

## 2022-10-10 DIAGNOSIS — K5790 Diverticulosis of intestine, part unspecified, without perforation or abscess without bleeding: Secondary | ICD-10-CM | POA: Diagnosis not present

## 2022-10-10 DIAGNOSIS — Z9884 Bariatric surgery status: Secondary | ICD-10-CM | POA: Diagnosis not present

## 2022-10-10 DIAGNOSIS — Z96653 Presence of artificial knee joint, bilateral: Secondary | ICD-10-CM | POA: Diagnosis not present

## 2022-10-10 DIAGNOSIS — I10 Essential (primary) hypertension: Secondary | ICD-10-CM | POA: Diagnosis not present

## 2022-10-10 DIAGNOSIS — Z9089 Acquired absence of other organs: Secondary | ICD-10-CM | POA: Diagnosis not present

## 2022-10-10 DIAGNOSIS — E785 Hyperlipidemia, unspecified: Secondary | ICD-10-CM | POA: Diagnosis not present

## 2022-10-10 DIAGNOSIS — Z7901 Long term (current) use of anticoagulants: Secondary | ICD-10-CM | POA: Diagnosis not present

## 2022-10-10 DIAGNOSIS — Z556 Problems related to health literacy: Secondary | ICD-10-CM | POA: Diagnosis not present

## 2022-10-10 DIAGNOSIS — M1990 Unspecified osteoarthritis, unspecified site: Secondary | ICD-10-CM | POA: Diagnosis not present

## 2022-10-10 DIAGNOSIS — Z9981 Dependence on supplemental oxygen: Secondary | ICD-10-CM | POA: Diagnosis not present

## 2022-10-10 DIAGNOSIS — Z9049 Acquired absence of other specified parts of digestive tract: Secondary | ICD-10-CM | POA: Diagnosis not present

## 2022-10-11 DIAGNOSIS — S42292D Other displaced fracture of upper end of left humerus, subsequent encounter for fracture with routine healing: Secondary | ICD-10-CM | POA: Diagnosis not present

## 2022-10-14 DIAGNOSIS — E119 Type 2 diabetes mellitus without complications: Secondary | ICD-10-CM | POA: Diagnosis not present

## 2022-10-14 DIAGNOSIS — Z7951 Long term (current) use of inhaled steroids: Secondary | ICD-10-CM | POA: Diagnosis not present

## 2022-10-14 DIAGNOSIS — S42292D Other displaced fracture of upper end of left humerus, subsequent encounter for fracture with routine healing: Secondary | ICD-10-CM | POA: Diagnosis not present

## 2022-10-14 DIAGNOSIS — Z79899 Other long term (current) drug therapy: Secondary | ICD-10-CM | POA: Diagnosis not present

## 2022-10-14 DIAGNOSIS — Z96653 Presence of artificial knee joint, bilateral: Secondary | ICD-10-CM | POA: Diagnosis not present

## 2022-10-14 DIAGNOSIS — I10 Essential (primary) hypertension: Secondary | ICD-10-CM | POA: Diagnosis not present

## 2022-10-14 DIAGNOSIS — J4489 Other specified chronic obstructive pulmonary disease: Secondary | ICD-10-CM | POA: Diagnosis not present

## 2022-10-14 DIAGNOSIS — G4730 Sleep apnea, unspecified: Secondary | ICD-10-CM | POA: Diagnosis not present

## 2022-10-14 DIAGNOSIS — Z9841 Cataract extraction status, right eye: Secondary | ICD-10-CM | POA: Diagnosis not present

## 2022-10-14 DIAGNOSIS — Z9049 Acquired absence of other specified parts of digestive tract: Secondary | ICD-10-CM | POA: Diagnosis not present

## 2022-10-14 DIAGNOSIS — Z9884 Bariatric surgery status: Secondary | ICD-10-CM | POA: Diagnosis not present

## 2022-10-14 DIAGNOSIS — Z993 Dependence on wheelchair: Secondary | ICD-10-CM | POA: Diagnosis not present

## 2022-10-14 DIAGNOSIS — K5790 Diverticulosis of intestine, part unspecified, without perforation or abscess without bleeding: Secondary | ICD-10-CM | POA: Diagnosis not present

## 2022-10-14 DIAGNOSIS — Z9981 Dependence on supplemental oxygen: Secondary | ICD-10-CM | POA: Diagnosis not present

## 2022-10-14 DIAGNOSIS — Z9089 Acquired absence of other organs: Secondary | ICD-10-CM | POA: Diagnosis not present

## 2022-10-14 DIAGNOSIS — E785 Hyperlipidemia, unspecified: Secondary | ICD-10-CM | POA: Diagnosis not present

## 2022-10-14 DIAGNOSIS — M1990 Unspecified osteoarthritis, unspecified site: Secondary | ICD-10-CM | POA: Diagnosis not present

## 2022-10-14 DIAGNOSIS — Z556 Problems related to health literacy: Secondary | ICD-10-CM | POA: Diagnosis not present

## 2022-10-14 DIAGNOSIS — Z7984 Long term (current) use of oral hypoglycemic drugs: Secondary | ICD-10-CM | POA: Diagnosis not present

## 2022-10-14 DIAGNOSIS — Z7901 Long term (current) use of anticoagulants: Secondary | ICD-10-CM | POA: Diagnosis not present

## 2022-10-14 DIAGNOSIS — K219 Gastro-esophageal reflux disease without esophagitis: Secondary | ICD-10-CM | POA: Diagnosis not present

## 2022-10-15 DIAGNOSIS — Z79899 Other long term (current) drug therapy: Secondary | ICD-10-CM | POA: Diagnosis not present

## 2022-10-15 DIAGNOSIS — Z556 Problems related to health literacy: Secondary | ICD-10-CM | POA: Diagnosis not present

## 2022-10-15 DIAGNOSIS — J4489 Other specified chronic obstructive pulmonary disease: Secondary | ICD-10-CM | POA: Diagnosis not present

## 2022-10-15 DIAGNOSIS — Z7901 Long term (current) use of anticoagulants: Secondary | ICD-10-CM | POA: Diagnosis not present

## 2022-10-15 DIAGNOSIS — E119 Type 2 diabetes mellitus without complications: Secondary | ICD-10-CM | POA: Diagnosis not present

## 2022-10-15 DIAGNOSIS — K219 Gastro-esophageal reflux disease without esophagitis: Secondary | ICD-10-CM | POA: Diagnosis not present

## 2022-10-15 DIAGNOSIS — Z9884 Bariatric surgery status: Secondary | ICD-10-CM | POA: Diagnosis not present

## 2022-10-15 DIAGNOSIS — Z7951 Long term (current) use of inhaled steroids: Secondary | ICD-10-CM | POA: Diagnosis not present

## 2022-10-15 DIAGNOSIS — Z96653 Presence of artificial knee joint, bilateral: Secondary | ICD-10-CM | POA: Diagnosis not present

## 2022-10-15 DIAGNOSIS — Z9981 Dependence on supplemental oxygen: Secondary | ICD-10-CM | POA: Diagnosis not present

## 2022-10-15 DIAGNOSIS — Z9089 Acquired absence of other organs: Secondary | ICD-10-CM | POA: Diagnosis not present

## 2022-10-15 DIAGNOSIS — S42292D Other displaced fracture of upper end of left humerus, subsequent encounter for fracture with routine healing: Secondary | ICD-10-CM | POA: Diagnosis not present

## 2022-10-15 DIAGNOSIS — K5790 Diverticulosis of intestine, part unspecified, without perforation or abscess without bleeding: Secondary | ICD-10-CM | POA: Diagnosis not present

## 2022-10-15 DIAGNOSIS — M1990 Unspecified osteoarthritis, unspecified site: Secondary | ICD-10-CM | POA: Diagnosis not present

## 2022-10-15 DIAGNOSIS — Z7984 Long term (current) use of oral hypoglycemic drugs: Secondary | ICD-10-CM | POA: Diagnosis not present

## 2022-10-15 DIAGNOSIS — E785 Hyperlipidemia, unspecified: Secondary | ICD-10-CM | POA: Diagnosis not present

## 2022-10-15 DIAGNOSIS — I10 Essential (primary) hypertension: Secondary | ICD-10-CM | POA: Diagnosis not present

## 2022-10-15 DIAGNOSIS — G4730 Sleep apnea, unspecified: Secondary | ICD-10-CM | POA: Diagnosis not present

## 2022-10-15 DIAGNOSIS — Z993 Dependence on wheelchair: Secondary | ICD-10-CM | POA: Diagnosis not present

## 2022-10-15 DIAGNOSIS — Z9049 Acquired absence of other specified parts of digestive tract: Secondary | ICD-10-CM | POA: Diagnosis not present

## 2022-10-15 DIAGNOSIS — Z9841 Cataract extraction status, right eye: Secondary | ICD-10-CM | POA: Diagnosis not present

## 2022-10-16 ENCOUNTER — Other Ambulatory Visit: Payer: Self-pay

## 2022-10-16 ENCOUNTER — Other Ambulatory Visit: Payer: Medicare Other

## 2022-10-16 DIAGNOSIS — M85852 Other specified disorders of bone density and structure, left thigh: Secondary | ICD-10-CM

## 2022-10-16 DIAGNOSIS — R600 Localized edema: Secondary | ICD-10-CM

## 2022-10-16 MED ORDER — ALENDRONATE SODIUM 70 MG PO TABS
70.0000 mg | ORAL_TABLET | ORAL | 3 refills | Status: DC
Start: 2022-10-16 — End: 2023-10-06

## 2022-10-16 MED ORDER — FUROSEMIDE 20 MG PO TABS
20.0000 mg | ORAL_TABLET | Freq: Every day | ORAL | 3 refills | Status: DC | PRN
Start: 2022-10-16 — End: 2023-09-24

## 2022-10-16 NOTE — Patient Outreach (Signed)
Care Management   Visit Note  10/16/2022 Name: Jenna [name] MRN: [id number] DOB: 11/27/1935  Subjective: Jenna [name] is a 87 y.o. year old female who is a primary care patient of Jenna Cords, DO. The Care Management team was consulted for assistance.      Engaged with patient spoke with patient by telephone.    Goals Addressed             This Visit's Progress    RNCM Effective management of Chronic Disease Management: DM, Depression, COPD, Memory Loss, and Chronic Pain       Current Barriers:  Knowledge Deficits related to plan of care for management of COPD, DMII, Chronic Pain, and Depression and Memory loss  Care Coordination needs related to Cognitive Deficits, Memory Deficits, Inability to perform ADL's independently, and Inability to perform IADL's independently  Chronic Disease Management support and education needs related to COPD, DMII, Chronic Pain, and memory loss, depression Cognitive Deficits Falls Loss of husband of 47 years on 08-25-2022  RNCM Clinical Goal(s):  Patient will verbalize basic understanding of COPD, DMII, and Chronic pain, memory changes, and depression disease process and self health management plan as evidenced by stable conditions, no acute exacerbations of conditions, working with the team for effective management of chronic conditions and disease progression. take all medications exactly as prescribed and will call provider for medication related questions as evidenced by compliance with medications    continue to work with RN Care Manager and/or Social Worker to address care management and care coordination needs related to COPD, DMII, and chronic pain, depression, and memory loss as evidenced by adherence to CM Team Scheduled appointments     demonstrate a decrease in COPD, DMII, and Chronic pain, memory loss, and depression exacerbations  as evidenced by effective management of chronic conditions and working with the team to  recognize changes impacting the patient for quick assessment and intervention through collaboration with Medical illustrator, provider, and care team.   Interventions: Evaluation of current treatment plan related to  self management and patient's adherence to plan as established by provider   COPD: (Status: Goal on Track (progressing): YES.) Long Term Goal  Reviewed medications with patient, including use of prescribed maintenance and rescue inhalers, and provided instruction on medication management and the importance of adherence. Has her medications. Her daughter assist with making sure she takes her medications as prescribed Provided patient with basic written and verbal COPD education on self care/management/and exacerbation prevention Advised patient to track and manage COPD triggers Provided written and verbal instructions on pursed lip breathing and utilized returned demonstration as teach back Provided instruction about proper use of medications used for management of COPD including inhalers Advised patient to self assesses COPD action plan zone and make appointment with provider if in the yellow zone for 48 hours without improvement Advised patient to engage in light exercise as tolerated 3-5 days a week to aid in the the management of COPD Provided education about and advised patient to utilize infection prevention strategies to reduce risk of respiratory infection Discussed the importance of adequate rest and management of fatigue with COPD Screening for signs and symptoms of depression related to chronic disease state  Assessed social determinant of health barriers  Diabetes:  (Status: Goal on Track (progressing): YES.) Long Term Goal   Lab Results  Component Value Date   HGBA1C 6.9 (A) 06/28/2022    Assessed patient's understanding of A1c goal: <7%. The patient is at goal.  Provided education to patient about basic DM disease process; Reviewed medications with patient and  discussed importance of medication adherence. Her daughter Jenna [name] who lives with her and assist in her care manages her medications;        Reviewed prescribed diet with patient heart healthy/ADA diet, the patient has not been eating well per her daughter. Discussed protein drinks and also will attach a recipe that has high protein levels in it. : Recipe to Help patients who have unintentional weight loss In a blender mix: 8 ounces of whole milk 1 pkt of Carnation instant breakfast ( vanilla works well 1 package of egg beaters 2 scoops of ice cream Fruit of your choice (bananna works well) This makes 3 to 4- 8 ounce cups. Can cover and keep in fridge or freeze  286 Calories 15 g of protein 41 g of carbs 8 g of fat ; Counseled on importance of regular laboratory monitoring as prescribed;        Discussed plans with patient for ongoing care management follow up and provided patient with direct contact information for care management team;      Provided patient with written educational materials related to hypo and hyperglycemia and importance of correct treatment;       Reviewed scheduled/upcoming provider appointments including: saw pcp on 09-16-2022 by video visit;         call provider for findings outside established parameters;       Referral made to social work team for assistance with review of resources available for the patient. Her daughter is living with her and her primary caregiver at this time due to decline in health and well being. The patients daughter ask about the CAP program and being able to qualify for the CAP program. Have made an appointment with the LCSW to follow up with the patient daughter to see if the patient would qualify for this. Reviewed with the patients daughter. Will talk to the LCSW and collaborate ;      Review of patient status, including review of consultants reports, relevant laboratory and other test results, and medications completed;       Advised  patient to discuss acute changes in her DM health and well being, questions or concerns with provider;      Screening for signs and symptoms of depression related to chronic disease state;        Assessed social determinant of health barriers;         Depression and Memory loss  (Status: Goal on Track (progressing): YES.) Long Term Goal  Evaluation of current treatment plan related to Depression and memory loss , Cognitive Deficits, Memory Deficits, Inability to perform ADL's independently, and Inability to perform IADL's independently self-management and patient's adherence to plan as established by provider. Discussed plans with patient for ongoing care management follow up and provided patient with direct contact information for care management team Advised patient to call the office for acute changes in the patients mental health, anxiety, depression, memory loss. The patient has had a decline in her memory and also just recently experienced the death of her husband Gaspar Garbe of 47 years. Her daughter states that she has seen further decline in the patients memory and forgetfulness; Provided education to patient re: the book resource: The 36 hour day for resource for helping understand memory changes and patients with memory loss/dementia. Also gave information on Arelia Sneddon and her work with dementia as resources to help understand changes the patients daughter  is seeing in the patient. She has it in her [employer] but has not purchased yet.  ; Reviewed medications with patient and discussed compliance. The patient is compliant with medications. Her daughter is assisting her; Collaborated with pcp regarding request for hospital bed for the patient. Discussed talking to the insurance company. Also have information for DME suppliers and the [employer]: [phone]- Etheleen Nicks- contact person. She wants to wait on the hospital bed at this time. Education provided. ; Provided patient with  information about memory loss and resources available educational materials related to changes in memory and decline; Reviewed scheduled/upcoming provider appointments including saw the pcp by video visit yesterday. Knows to call for changes. The daughter was asking about paperwork for a handicap place card. The office has these and it is a fee for $20.00; Social Work referral for resources if needed. Social worker scheduled for 10-21-2022 at 230 pm; Discussed plans with patient for ongoing care management follow up and provided patient with direct contact information for care management team; Advised patient to discuss changes in memory, acute changes and decline in condition, DME needs, questions, and concerns with provider; Screening for signs and symptoms of depression related to chronic disease state;  Assessed social determinant of health barriers;   Pain:  (Status: Goal on Track (progressing): YES.) Long Term Goal  Pain assessment performed. She still is having pain in her arm but it is not as severe as was. She is "slowly getting better". Denies any new falls.  Medications reviewed- The patient has adequate pain medications Reviewed provider established plan for pain management; Discussed importance of adherence to all scheduled medical appointments; Counseled on the importance of reporting any/all new or changed pain symptoms or management strategies to pain management provider; Advised patient to report to care team affect of pain on daily activities; Discussed use of relaxation techniques and/or diversional activities to assist with pain reduction (distraction, imagery, relaxation, massage, acupressure, TENS, heat, and cold application; Reviewed with patient prescribed pharmacological and nonpharmacological pain relief strategies; Advised patient to discuss changes in level or intensity of pain, new concerns or questions, new PT needs with provider; Screening for signs and symptoms of  depression related to chronic disease state;  Assessed social determinant of health barriers;   Patient Goals/Self-Care Activities: Take medications as prescribed   Attend all scheduled provider appointments Call pharmacy for medication refills 3-7 days in advance of running out of medications Call provider office for new concerns or questions  Work with the social worker to address care coordination needs and will continue to work with the clinical team to address health care and disease management related needs Work with the pharmacist to address medication management needs and will continue to work with the clinical team to address health care and disease management related needs call the Suicide and Crisis Lifeline: 988 call the [place] National Suicide Prevention Lifeline: (570)125-9279 or TTY: 512-643-7828 TTY 562-701-9387) to talk to a trained counselor call 1-800-273-TALK (toll free, 24 hour hotline) if experiencing a Mental Health or Behavioral Health Crisis  check feet daily for cuts, sores or redness manage portion size keep feet up while sitting wash and dry feet carefully every day wear comfortable, cotton socks wear comfortable, well-fitting shoes avoid second hand smoke eliminate smoking in my home identify and remove indoor air pollutants limit outdoor activity during cold weather listen for public air quality announcements every day develop a rescue plan eliminate symptom triggers at home  Consent to Services:  Patient was given information about care management services, agreed to services, and gave verbal consent to participate.   Plan: Telephone follow up appointment with care management team member scheduled for: 11-19-2022 at 1145 am  Alto Denver RN, MSN, CCM RN Care Manager  [facility]  Ambulatory Care Management  Direct Number: [phone]

## 2022-10-16 NOTE — Patient Instructions (Signed)
Visit Information  Thank you for taking time to visit with me today. Please don't hesitate to contact me if I can be of assistance to you before our next scheduled telephone appointment.  Following are the goals we discussed today:   Goals Addressed             This Visit's Progress    RNCM Effective management of Chronic Disease Management: DM, Depression, COPD, Memory Loss, and Chronic Pain       Current Barriers:  Knowledge Deficits related to plan of care for management of COPD, DMII, Chronic Pain, and Depression and Memory loss  Care Coordination needs related to Cognitive Deficits, Memory Deficits, Inability to perform ADL's independently, and Inability to perform IADL's independently  Chronic Disease Management support and education needs related to COPD, DMII, Chronic Pain, and memory loss, depression Cognitive Deficits Falls Loss of husband of 47 years on 08-25-2022  RNCM Clinical Goal(s):  Patient will verbalize basic understanding of COPD, DMII, and Chronic pain, memory changes, and depression disease process and self health management plan as evidenced by stable conditions, no acute exacerbations of conditions, working with the team for effective management of chronic conditions and disease progression. take all medications exactly as prescribed and will call provider for medication related questions as evidenced by compliance with medications    continue to work with RN Care Manager and/or Social Worker to address care management and care coordination needs related to COPD, DMII, and chronic pain, depression, and memory loss as evidenced by adherence to CM Team Scheduled appointments     demonstrate a decrease in COPD, DMII, and Chronic pain, memory loss, and depression exacerbations  as evidenced by effective management of chronic conditions and working with the team to recognize changes impacting the patient for quick assessment and intervention through collaboration with Research officer, trade union, provider, and care team.   Interventions: Evaluation of current treatment plan related to  self management and patient's adherence to plan as established by provider   COPD: (Status: Goal on Track (progressing): YES.) Long Term Goal  Reviewed medications with patient, including use of prescribed maintenance and rescue inhalers, and provided instruction on medication management and the importance of adherence. Has her medications. Her daughter assist with making sure she takes her medications as prescribed Provided patient with basic written and verbal COPD education on self care/management/and exacerbation prevention Advised patient to track and manage COPD triggers Provided written and verbal instructions on pursed lip breathing and utilized returned demonstration as teach back Provided instruction about proper use of medications used for management of COPD including inhalers Advised patient to self assesses COPD action plan zone and make appointment with provider if in the yellow zone for 48 hours without improvement Advised patient to engage in light exercise as tolerated 3-5 days a week to aid in the the management of COPD Provided education about and advised patient to utilize infection prevention strategies to reduce risk of respiratory infection Discussed the importance of adequate rest and management of fatigue with COPD Screening for signs and symptoms of depression related to chronic disease state  Assessed social determinant of health barriers  Diabetes:  (Status: Goal on Track (progressing): YES.) Long Term Goal   Lab Results  Component Value Date   HGBA1C 6.9 (A) 06/28/2022    Assessed patient's understanding of A1c goal: <7%. The patient is at goal. Provided education to patient about basic DM disease process; Reviewed medications with patient and discussed importance of medication adherence. Her  daughter [name] who lives with her and assist in her care manages  her medications;        Reviewed prescribed diet with patient heart healthy/ADA diet, the patient has not been eating well per her daughter. Discussed protein drinks and also will attach a recipe that has high protein levels in it. : Recipe to Help patients who have unintentional weight loss In a blender mix: 8 ounces of whole milk 1 pkt of Carnation instant breakfast ( vanilla works well 1 package of egg beaters 2 scoops of ice cream Fruit of your choice (bananna works well) This makes 3 to 4- 8 ounce cups. Can cover and keep in fridge or freeze  286 Calories 15 g of protein 41 g of carbs 8 g of fat ; Counseled on importance of regular laboratory monitoring as prescribed;        Discussed plans with patient for ongoing care management follow up and provided patient with direct contact information for care management team;      Provided patient with written educational materials related to hypo and hyperglycemia and importance of correct treatment;       Reviewed scheduled/upcoming provider appointments including: saw pcp on 09-16-2022 by video visit;         call provider for findings outside established parameters;       Referral made to social work team for assistance with review of resources available for the patient. Her daughter is living with her and her primary caregiver at this time due to decline in health and well being. The patients daughter ask about the CAP program and being able to qualify for the CAP program. Have made an appointment with the LCSW to follow up with the patient daughter to see if the patient would qualify for this. Reviewed with the patients daughter. Will talk to the LCSW and collaborate ;      Review of patient status, including review of consultants reports, relevant laboratory and other test results, and medications completed;       Advised patient to discuss acute changes in her DM health and well being, questions or concerns with provider;      Screening  for signs and symptoms of depression related to chronic disease state;        Assessed social determinant of health barriers;         Depression and Memory loss  (Status: Goal on Track (progressing): YES.) Long Term Goal  Evaluation of current treatment plan related to Depression and memory loss , Cognitive Deficits, Memory Deficits, Inability to perform ADL's independently, and Inability to perform IADL's independently self-management and patient's adherence to plan as established by provider. Discussed plans with patient for ongoing care management follow up and provided patient with direct contact information for care management team Advised patient to call the office for acute changes in the patients mental health, anxiety, depression, memory loss. The patient has had a decline in her memory and also just recently experienced the death of her husband Gaspar Garbe of 47 years. Her daughter states that she has seen further decline in the patients memory and forgetfulness; Provided education to patient re: the book resource: The 36 hour day for resource for helping understand memory changes and patients with memory loss/dementia. Also gave information on Arelia Sneddon and her work with dementia as resources to help understand changes the patients daughter is seeing in the patient. She has it in her [employer] but has not purchased yet.  ; Reviewed  medications with patient and discussed compliance. The patient is compliant with medications. Her daughter is assisting her; Collaborated with pcp regarding request for hospital bed for the patient. Discussed talking to the insurance company. Also have information for DME suppliers and the [employer]: [phone]- Etheleen Nicks- contact person. She wants to wait on the hospital bed at this time. Education provided. ; Provided patient with information about memory loss and resources available educational materials related to changes in memory and decline; Reviewed  scheduled/upcoming provider appointments including saw the pcp by video visit yesterday. Knows to call for changes. The daughter was asking about paperwork for a handicap place card. The office has these and it is a fee for $20.00; Social Work referral for resources if needed. Social worker scheduled for 10-21-2022 at 230 pm; Discussed plans with patient for ongoing care management follow up and provided patient with direct contact information for care management team; Advised patient to discuss changes in memory, acute changes and decline in condition, DME needs, questions, and concerns with provider; Screening for signs and symptoms of depression related to chronic disease state;  Assessed social determinant of health barriers;   Pain:  (Status: Goal on Track (progressing): YES.) Long Term Goal  Pain assessment performed. She still is having pain in her arm but it is not as severe as was. She is "slowly getting better". Denies any new falls.  Medications reviewed- The patient has adequate pain medications Reviewed provider established plan for pain management; Discussed importance of adherence to all scheduled medical appointments; Counseled on the importance of reporting any/all new or changed pain symptoms or management strategies to pain management provider; Advised patient to report to care team affect of pain on daily activities; Discussed use of relaxation techniques and/or diversional activities to assist with pain reduction (distraction, imagery, relaxation, massage, acupressure, TENS, heat, and cold application; Reviewed with patient prescribed pharmacological and nonpharmacological pain relief strategies; Advised patient to discuss changes in level or intensity of pain, new concerns or questions, new PT needs with provider; Screening for signs and symptoms of depression related to chronic disease state;  Assessed social determinant of health barriers;   Patient Goals/Self-Care  Activities: Take medications as prescribed   Attend all scheduled provider appointments Call pharmacy for medication refills 3-7 days in advance of running out of medications Call provider office for new concerns or questions  Work with the social worker to address care coordination needs and will continue to work with the clinical team to address health care and disease management related needs Work with the pharmacist to address medication management needs and will continue to work with the clinical team to address health care and disease management related needs call the Suicide and Crisis Lifeline: 988 call the [place] National Suicide Prevention Lifeline: (801) 326-4703 or TTY: 820 479 9794 TTY 765-170-2307) to talk to a trained counselor call 1-800-273-TALK (toll free, 24 hour hotline) if experiencing a Mental Health or Behavioral Health Crisis  check feet daily for cuts, sores or redness manage portion size keep feet up while sitting wash and dry feet carefully every day wear comfortable, cotton socks wear comfortable, well-fitting shoes avoid second hand smoke eliminate smoking in my home identify and remove indoor air pollutants limit outdoor activity during cold weather listen for public air quality announcements every day develop a rescue plan eliminate symptom triggers at home           Our next appointment is by telephone on 11-19-2022 at 1145 am  Please call the  care guide team at [phone] if you need to cancel or reschedule your appointment.   If you are experiencing a Mental Health or Behavioral Health Crisis or need someone to talk to, please call the Suicide and Crisis Lifeline: 988 call the [place] National Suicide Prevention Lifeline: 2230779467 or TTY: (814)704-3420 TTY 671 604 8399) to talk to a trained counselor call 1-800-273-TALK (toll free, 24 hour hotline)   Patient verbalizes understanding of instructions and care plan provided today and agrees to  view in MyChart. Active MyChart status and patient understanding of how to access instructions and care plan via MyChart confirmed with patient.     Telephone follow up appointment with care management team member scheduled for: 11-19-2022 at 1145 am  Alto Denver RN, MSN, CCM RN Care Manager  [facility]  Ambulatory Care Management  Direct Number: [phone]

## 2022-10-17 ENCOUNTER — Telehealth: Payer: Self-pay

## 2022-10-17 MED ORDER — POTASSIUM CHLORIDE CRYS ER 20 MEQ PO TBCR: 40.0000 meq | EXTENDED_RELEASE_TABLET | Freq: Two times a day (BID) | ORAL | 1 refills | Status: DC

## 2022-10-17 MED ORDER — EMPAGLIFLOZIN 10 MG PO TABS: 10.0000 mg | ORAL_TABLET | Freq: Every day | ORAL | 1 refills | Status: DC

## 2022-10-17 NOTE — Telephone Encounter (Signed)
[facility] faxed refills request for the following medications:  Jardiance 10 mg  Potassium 

## 2022-10-18 DIAGNOSIS — I10 Essential (primary) hypertension: Secondary | ICD-10-CM | POA: Diagnosis not present

## 2022-10-18 DIAGNOSIS — E785 Hyperlipidemia, unspecified: Secondary | ICD-10-CM | POA: Diagnosis not present

## 2022-10-18 DIAGNOSIS — Z96653 Presence of artificial knee joint, bilateral: Secondary | ICD-10-CM | POA: Diagnosis not present

## 2022-10-18 DIAGNOSIS — G4730 Sleep apnea, unspecified: Secondary | ICD-10-CM | POA: Diagnosis not present

## 2022-10-18 DIAGNOSIS — M1990 Unspecified osteoarthritis, unspecified site: Secondary | ICD-10-CM | POA: Diagnosis not present

## 2022-10-18 DIAGNOSIS — Z7901 Long term (current) use of anticoagulants: Secondary | ICD-10-CM | POA: Diagnosis not present

## 2022-10-18 DIAGNOSIS — Z7951 Long term (current) use of inhaled steroids: Secondary | ICD-10-CM | POA: Diagnosis not present

## 2022-10-18 DIAGNOSIS — Z9981 Dependence on supplemental oxygen: Secondary | ICD-10-CM | POA: Diagnosis not present

## 2022-10-18 DIAGNOSIS — Z993 Dependence on wheelchair: Secondary | ICD-10-CM | POA: Diagnosis not present

## 2022-10-18 DIAGNOSIS — Z9049 Acquired absence of other specified parts of digestive tract: Secondary | ICD-10-CM | POA: Diagnosis not present

## 2022-10-18 DIAGNOSIS — J4489 Other specified chronic obstructive pulmonary disease: Secondary | ICD-10-CM | POA: Diagnosis not present

## 2022-10-18 DIAGNOSIS — Z79899 Other long term (current) drug therapy: Secondary | ICD-10-CM | POA: Diagnosis not present

## 2022-10-18 DIAGNOSIS — Z7984 Long term (current) use of oral hypoglycemic drugs: Secondary | ICD-10-CM | POA: Diagnosis not present

## 2022-10-18 DIAGNOSIS — Z556 Problems related to health literacy: Secondary | ICD-10-CM | POA: Diagnosis not present

## 2022-10-18 DIAGNOSIS — K219 Gastro-esophageal reflux disease without esophagitis: Secondary | ICD-10-CM | POA: Diagnosis not present

## 2022-10-18 DIAGNOSIS — K5790 Diverticulosis of intestine, part unspecified, without perforation or abscess without bleeding: Secondary | ICD-10-CM | POA: Diagnosis not present

## 2022-10-18 DIAGNOSIS — S42292D Other displaced fracture of upper end of left humerus, subsequent encounter for fracture with routine healing: Secondary | ICD-10-CM | POA: Diagnosis not present

## 2022-10-18 DIAGNOSIS — Z9884 Bariatric surgery status: Secondary | ICD-10-CM | POA: Diagnosis not present

## 2022-10-18 DIAGNOSIS — E119 Type 2 diabetes mellitus without complications: Secondary | ICD-10-CM | POA: Diagnosis not present

## 2022-10-18 DIAGNOSIS — Z9841 Cataract extraction status, right eye: Secondary | ICD-10-CM | POA: Diagnosis not present

## 2022-10-18 DIAGNOSIS — Z9089 Acquired absence of other organs: Secondary | ICD-10-CM | POA: Diagnosis not present

## 2022-10-21 ENCOUNTER — Encounter: Payer: Medicare Other | Admitting: *Deleted

## 2022-10-21 ENCOUNTER — Telehealth: Payer: Self-pay | Admitting: *Deleted

## 2022-10-21 ENCOUNTER — Encounter: Payer: Self-pay | Admitting: *Deleted

## 2022-10-21 DIAGNOSIS — G4730 Sleep apnea, unspecified: Secondary | ICD-10-CM | POA: Diagnosis not present

## 2022-10-21 DIAGNOSIS — Z556 Problems related to health literacy: Secondary | ICD-10-CM | POA: Diagnosis not present

## 2022-10-21 DIAGNOSIS — K219 Gastro-esophageal reflux disease without esophagitis: Secondary | ICD-10-CM | POA: Diagnosis not present

## 2022-10-21 DIAGNOSIS — M1990 Unspecified osteoarthritis, unspecified site: Secondary | ICD-10-CM | POA: Diagnosis not present

## 2022-10-21 DIAGNOSIS — K5790 Diverticulosis of intestine, part unspecified, without perforation or abscess without bleeding: Secondary | ICD-10-CM | POA: Diagnosis not present

## 2022-10-21 DIAGNOSIS — I10 Essential (primary) hypertension: Secondary | ICD-10-CM | POA: Diagnosis not present

## 2022-10-21 DIAGNOSIS — Z7984 Long term (current) use of oral hypoglycemic drugs: Secondary | ICD-10-CM | POA: Diagnosis not present

## 2022-10-21 DIAGNOSIS — Z96653 Presence of artificial knee joint, bilateral: Secondary | ICD-10-CM | POA: Diagnosis not present

## 2022-10-21 DIAGNOSIS — Z9981 Dependence on supplemental oxygen: Secondary | ICD-10-CM | POA: Diagnosis not present

## 2022-10-21 DIAGNOSIS — Z7901 Long term (current) use of anticoagulants: Secondary | ICD-10-CM | POA: Diagnosis not present

## 2022-10-21 DIAGNOSIS — Z9841 Cataract extraction status, right eye: Secondary | ICD-10-CM | POA: Diagnosis not present

## 2022-10-21 DIAGNOSIS — Z79899 Other long term (current) drug therapy: Secondary | ICD-10-CM | POA: Diagnosis not present

## 2022-10-21 DIAGNOSIS — J4489 Other specified chronic obstructive pulmonary disease: Secondary | ICD-10-CM | POA: Diagnosis not present

## 2022-10-21 DIAGNOSIS — Z9884 Bariatric surgery status: Secondary | ICD-10-CM | POA: Diagnosis not present

## 2022-10-21 DIAGNOSIS — S42292D Other displaced fracture of upper end of left humerus, subsequent encounter for fracture with routine healing: Secondary | ICD-10-CM | POA: Diagnosis not present

## 2022-10-21 DIAGNOSIS — Z9049 Acquired absence of other specified parts of digestive tract: Secondary | ICD-10-CM | POA: Diagnosis not present

## 2022-10-21 DIAGNOSIS — Z993 Dependence on wheelchair: Secondary | ICD-10-CM | POA: Diagnosis not present

## 2022-10-21 DIAGNOSIS — E785 Hyperlipidemia, unspecified: Secondary | ICD-10-CM | POA: Diagnosis not present

## 2022-10-21 DIAGNOSIS — Z9089 Acquired absence of other organs: Secondary | ICD-10-CM | POA: Diagnosis not present

## 2022-10-21 DIAGNOSIS — E119 Type 2 diabetes mellitus without complications: Secondary | ICD-10-CM | POA: Diagnosis not present

## 2022-10-21 DIAGNOSIS — Z7951 Long term (current) use of inhaled steroids: Secondary | ICD-10-CM | POA: Diagnosis not present

## 2022-10-21 NOTE — Patient Outreach (Signed)
Care Coordination   10/21/2022 Name: Jenna [name] MRN: [id number] DOB: 1935-09-27   Care Coordination Outreach Attempts:  An unsuccessful telephone outreach was attempted today to offer the patient information about available care coordination services.  Follow Up Plan:  Additional outreach attempts will be made to offer the patient care coordination information and services.   Encounter Outcome:  No Answer   Care Coordination Interventions:  No, not indicated    Ilisha Blust, LCSW [place]  [facility], [facility] Licensed Clinical Social Worker Care Coordinator  Direct Dial: [phone]

## 2022-10-23 DIAGNOSIS — Z556 Problems related to health literacy: Secondary | ICD-10-CM | POA: Diagnosis not present

## 2022-10-23 DIAGNOSIS — M1990 Unspecified osteoarthritis, unspecified site: Secondary | ICD-10-CM | POA: Diagnosis not present

## 2022-10-23 DIAGNOSIS — E119 Type 2 diabetes mellitus without complications: Secondary | ICD-10-CM | POA: Diagnosis not present

## 2022-10-23 DIAGNOSIS — Z96653 Presence of artificial knee joint, bilateral: Secondary | ICD-10-CM | POA: Diagnosis not present

## 2022-10-23 DIAGNOSIS — Z7901 Long term (current) use of anticoagulants: Secondary | ICD-10-CM | POA: Diagnosis not present

## 2022-10-23 DIAGNOSIS — Z9884 Bariatric surgery status: Secondary | ICD-10-CM | POA: Diagnosis not present

## 2022-10-23 DIAGNOSIS — J4489 Other specified chronic obstructive pulmonary disease: Secondary | ICD-10-CM | POA: Diagnosis not present

## 2022-10-23 DIAGNOSIS — Z7984 Long term (current) use of oral hypoglycemic drugs: Secondary | ICD-10-CM | POA: Diagnosis not present

## 2022-10-23 DIAGNOSIS — S42292D Other displaced fracture of upper end of left humerus, subsequent encounter for fracture with routine healing: Secondary | ICD-10-CM | POA: Diagnosis not present

## 2022-10-23 DIAGNOSIS — K219 Gastro-esophageal reflux disease without esophagitis: Secondary | ICD-10-CM | POA: Diagnosis not present

## 2022-10-23 DIAGNOSIS — I10 Essential (primary) hypertension: Secondary | ICD-10-CM | POA: Diagnosis not present

## 2022-10-23 DIAGNOSIS — Z9049 Acquired absence of other specified parts of digestive tract: Secondary | ICD-10-CM | POA: Diagnosis not present

## 2022-10-23 DIAGNOSIS — Z9981 Dependence on supplemental oxygen: Secondary | ICD-10-CM | POA: Diagnosis not present

## 2022-10-23 DIAGNOSIS — Z993 Dependence on wheelchair: Secondary | ICD-10-CM | POA: Diagnosis not present

## 2022-10-23 DIAGNOSIS — E785 Hyperlipidemia, unspecified: Secondary | ICD-10-CM | POA: Diagnosis not present

## 2022-10-23 DIAGNOSIS — G4730 Sleep apnea, unspecified: Secondary | ICD-10-CM | POA: Diagnosis not present

## 2022-10-23 DIAGNOSIS — Z79899 Other long term (current) drug therapy: Secondary | ICD-10-CM | POA: Diagnosis not present

## 2022-10-23 DIAGNOSIS — Z9841 Cataract extraction status, right eye: Secondary | ICD-10-CM | POA: Diagnosis not present

## 2022-10-23 DIAGNOSIS — Z9089 Acquired absence of other organs: Secondary | ICD-10-CM | POA: Diagnosis not present

## 2022-10-23 DIAGNOSIS — Z7951 Long term (current) use of inhaled steroids: Secondary | ICD-10-CM | POA: Diagnosis not present

## 2022-10-23 DIAGNOSIS — K5790 Diverticulosis of intestine, part unspecified, without perforation or abscess without bleeding: Secondary | ICD-10-CM | POA: Diagnosis not present

## 2022-10-24 DIAGNOSIS — Z0279 Encounter for issue of other medical certificate: Secondary | ICD-10-CM

## 2022-10-25 ENCOUNTER — Ambulatory Visit: Payer: Medicare Other

## 2022-10-25 DIAGNOSIS — Z9049 Acquired absence of other specified parts of digestive tract: Secondary | ICD-10-CM | POA: Diagnosis not present

## 2022-10-25 DIAGNOSIS — Z7951 Long term (current) use of inhaled steroids: Secondary | ICD-10-CM | POA: Diagnosis not present

## 2022-10-25 DIAGNOSIS — K5790 Diverticulosis of intestine, part unspecified, without perforation or abscess without bleeding: Secondary | ICD-10-CM | POA: Diagnosis not present

## 2022-10-25 DIAGNOSIS — Z9089 Acquired absence of other organs: Secondary | ICD-10-CM | POA: Diagnosis not present

## 2022-10-25 DIAGNOSIS — M1990 Unspecified osteoarthritis, unspecified site: Secondary | ICD-10-CM | POA: Diagnosis not present

## 2022-10-25 DIAGNOSIS — E119 Type 2 diabetes mellitus without complications: Secondary | ICD-10-CM | POA: Diagnosis not present

## 2022-10-25 DIAGNOSIS — Z993 Dependence on wheelchair: Secondary | ICD-10-CM | POA: Diagnosis not present

## 2022-10-25 DIAGNOSIS — E785 Hyperlipidemia, unspecified: Secondary | ICD-10-CM | POA: Diagnosis not present

## 2022-10-25 DIAGNOSIS — Z9981 Dependence on supplemental oxygen: Secondary | ICD-10-CM | POA: Diagnosis not present

## 2022-10-25 DIAGNOSIS — K219 Gastro-esophageal reflux disease without esophagitis: Secondary | ICD-10-CM | POA: Diagnosis not present

## 2022-10-25 DIAGNOSIS — Z9884 Bariatric surgery status: Secondary | ICD-10-CM | POA: Diagnosis not present

## 2022-10-25 DIAGNOSIS — S42292D Other displaced fracture of upper end of left humerus, subsequent encounter for fracture with routine healing: Secondary | ICD-10-CM | POA: Diagnosis not present

## 2022-10-25 DIAGNOSIS — G4730 Sleep apnea, unspecified: Secondary | ICD-10-CM | POA: Diagnosis not present

## 2022-10-25 DIAGNOSIS — J4489 Other specified chronic obstructive pulmonary disease: Secondary | ICD-10-CM | POA: Diagnosis not present

## 2022-10-25 DIAGNOSIS — Z7984 Long term (current) use of oral hypoglycemic drugs: Secondary | ICD-10-CM | POA: Diagnosis not present

## 2022-10-25 DIAGNOSIS — I10 Essential (primary) hypertension: Secondary | ICD-10-CM | POA: Diagnosis not present

## 2022-10-25 DIAGNOSIS — Z79899 Other long term (current) drug therapy: Secondary | ICD-10-CM | POA: Diagnosis not present

## 2022-10-25 DIAGNOSIS — Z556 Problems related to health literacy: Secondary | ICD-10-CM | POA: Diagnosis not present

## 2022-10-25 DIAGNOSIS — Z7901 Long term (current) use of anticoagulants: Secondary | ICD-10-CM | POA: Diagnosis not present

## 2022-10-25 DIAGNOSIS — Z96653 Presence of artificial knee joint, bilateral: Secondary | ICD-10-CM | POA: Diagnosis not present

## 2022-10-25 DIAGNOSIS — Z9841 Cataract extraction status, right eye: Secondary | ICD-10-CM | POA: Diagnosis not present

## 2022-10-28 DIAGNOSIS — F5101 Primary insomnia: Secondary | ICD-10-CM | POA: Diagnosis not present

## 2022-10-29 DIAGNOSIS — Z7951 Long term (current) use of inhaled steroids: Secondary | ICD-10-CM | POA: Diagnosis not present

## 2022-10-29 DIAGNOSIS — J4489 Other specified chronic obstructive pulmonary disease: Secondary | ICD-10-CM | POA: Diagnosis not present

## 2022-10-29 DIAGNOSIS — Z556 Problems related to health literacy: Secondary | ICD-10-CM | POA: Diagnosis not present

## 2022-10-29 DIAGNOSIS — I10 Essential (primary) hypertension: Secondary | ICD-10-CM | POA: Diagnosis not present

## 2022-10-29 DIAGNOSIS — Z9981 Dependence on supplemental oxygen: Secondary | ICD-10-CM | POA: Diagnosis not present

## 2022-10-29 DIAGNOSIS — K5790 Diverticulosis of intestine, part unspecified, without perforation or abscess without bleeding: Secondary | ICD-10-CM | POA: Diagnosis not present

## 2022-10-29 DIAGNOSIS — K219 Gastro-esophageal reflux disease without esophagitis: Secondary | ICD-10-CM | POA: Diagnosis not present

## 2022-10-29 DIAGNOSIS — Z9841 Cataract extraction status, right eye: Secondary | ICD-10-CM | POA: Diagnosis not present

## 2022-10-29 DIAGNOSIS — E785 Hyperlipidemia, unspecified: Secondary | ICD-10-CM | POA: Diagnosis not present

## 2022-10-29 DIAGNOSIS — Z7901 Long term (current) use of anticoagulants: Secondary | ICD-10-CM | POA: Diagnosis not present

## 2022-10-29 DIAGNOSIS — Z993 Dependence on wheelchair: Secondary | ICD-10-CM | POA: Diagnosis not present

## 2022-10-29 DIAGNOSIS — Z9049 Acquired absence of other specified parts of digestive tract: Secondary | ICD-10-CM | POA: Diagnosis not present

## 2022-10-29 DIAGNOSIS — Z96653 Presence of artificial knee joint, bilateral: Secondary | ICD-10-CM | POA: Diagnosis not present

## 2022-10-29 DIAGNOSIS — Z9884 Bariatric surgery status: Secondary | ICD-10-CM | POA: Diagnosis not present

## 2022-10-29 DIAGNOSIS — M1990 Unspecified osteoarthritis, unspecified site: Secondary | ICD-10-CM | POA: Diagnosis not present

## 2022-10-29 DIAGNOSIS — Z7984 Long term (current) use of oral hypoglycemic drugs: Secondary | ICD-10-CM | POA: Diagnosis not present

## 2022-10-29 DIAGNOSIS — E119 Type 2 diabetes mellitus without complications: Secondary | ICD-10-CM | POA: Diagnosis not present

## 2022-10-29 DIAGNOSIS — Z79899 Other long term (current) drug therapy: Secondary | ICD-10-CM | POA: Diagnosis not present

## 2022-10-29 DIAGNOSIS — S42292D Other displaced fracture of upper end of left humerus, subsequent encounter for fracture with routine healing: Secondary | ICD-10-CM | POA: Diagnosis not present

## 2022-10-29 DIAGNOSIS — G4730 Sleep apnea, unspecified: Secondary | ICD-10-CM | POA: Diagnosis not present

## 2022-10-29 DIAGNOSIS — Z9089 Acquired absence of other organs: Secondary | ICD-10-CM | POA: Diagnosis not present

## 2022-10-30 ENCOUNTER — Other Ambulatory Visit: Payer: Self-pay | Admitting: Neurology

## 2022-10-30 DIAGNOSIS — F03A11 Unspecified dementia, mild, with agitation: Secondary | ICD-10-CM

## 2022-10-31 DIAGNOSIS — Z7951 Long term (current) use of inhaled steroids: Secondary | ICD-10-CM | POA: Diagnosis not present

## 2022-10-31 DIAGNOSIS — Z79899 Other long term (current) drug therapy: Secondary | ICD-10-CM | POA: Diagnosis not present

## 2022-10-31 DIAGNOSIS — Z9981 Dependence on supplemental oxygen: Secondary | ICD-10-CM | POA: Diagnosis not present

## 2022-10-31 DIAGNOSIS — I10 Essential (primary) hypertension: Secondary | ICD-10-CM | POA: Diagnosis not present

## 2022-10-31 DIAGNOSIS — G4730 Sleep apnea, unspecified: Secondary | ICD-10-CM | POA: Diagnosis not present

## 2022-10-31 DIAGNOSIS — Z7901 Long term (current) use of anticoagulants: Secondary | ICD-10-CM | POA: Diagnosis not present

## 2022-10-31 DIAGNOSIS — E785 Hyperlipidemia, unspecified: Secondary | ICD-10-CM | POA: Diagnosis not present

## 2022-10-31 DIAGNOSIS — Z993 Dependence on wheelchair: Secondary | ICD-10-CM | POA: Diagnosis not present

## 2022-10-31 DIAGNOSIS — Z556 Problems related to health literacy: Secondary | ICD-10-CM | POA: Diagnosis not present

## 2022-10-31 DIAGNOSIS — E119 Type 2 diabetes mellitus without complications: Secondary | ICD-10-CM | POA: Diagnosis not present

## 2022-10-31 DIAGNOSIS — Z9089 Acquired absence of other organs: Secondary | ICD-10-CM | POA: Diagnosis not present

## 2022-10-31 DIAGNOSIS — K5790 Diverticulosis of intestine, part unspecified, without perforation or abscess without bleeding: Secondary | ICD-10-CM | POA: Diagnosis not present

## 2022-10-31 DIAGNOSIS — Z96653 Presence of artificial knee joint, bilateral: Secondary | ICD-10-CM | POA: Diagnosis not present

## 2022-10-31 DIAGNOSIS — K219 Gastro-esophageal reflux disease without esophagitis: Secondary | ICD-10-CM | POA: Diagnosis not present

## 2022-10-31 DIAGNOSIS — M1990 Unspecified osteoarthritis, unspecified site: Secondary | ICD-10-CM | POA: Diagnosis not present

## 2022-10-31 DIAGNOSIS — Z9884 Bariatric surgery status: Secondary | ICD-10-CM | POA: Diagnosis not present

## 2022-10-31 DIAGNOSIS — Z9049 Acquired absence of other specified parts of digestive tract: Secondary | ICD-10-CM | POA: Diagnosis not present

## 2022-10-31 DIAGNOSIS — Z7984 Long term (current) use of oral hypoglycemic drugs: Secondary | ICD-10-CM | POA: Diagnosis not present

## 2022-10-31 DIAGNOSIS — J4489 Other specified chronic obstructive pulmonary disease: Secondary | ICD-10-CM | POA: Diagnosis not present

## 2022-10-31 DIAGNOSIS — S42292D Other displaced fracture of upper end of left humerus, subsequent encounter for fracture with routine healing: Secondary | ICD-10-CM | POA: Diagnosis not present

## 2022-10-31 DIAGNOSIS — Z9841 Cataract extraction status, right eye: Secondary | ICD-10-CM | POA: Diagnosis not present

## 2022-11-01 DIAGNOSIS — G8929 Other chronic pain: Secondary | ICD-10-CM | POA: Diagnosis not present

## 2022-11-01 DIAGNOSIS — M25512 Pain in left shoulder: Secondary | ICD-10-CM | POA: Diagnosis not present

## 2022-11-01 DIAGNOSIS — S42292D Other displaced fracture of upper end of left humerus, subsequent encounter for fracture with routine healing: Secondary | ICD-10-CM | POA: Diagnosis not present

## 2022-11-04 ENCOUNTER — Ambulatory Visit: Admission: RE | Admit: 2022-11-04 | Payer: Medicare Other | Source: Ambulatory Visit

## 2022-11-06 ENCOUNTER — Ambulatory Visit
Admission: RE | Admit: 2022-11-06 | Discharge: 2022-11-06 | Disposition: A | Discharge status: Home / Self Care | Payer: Medicare Other | Source: Ambulatory Visit | Attending: Neurology | Admitting: Neurology

## 2022-11-06 DIAGNOSIS — I6782 Cerebral ischemia: Secondary | ICD-10-CM | POA: Diagnosis not present

## 2022-11-06 DIAGNOSIS — F03A11 Unspecified dementia, mild, with agitation: Secondary | ICD-10-CM | POA: Diagnosis present

## 2022-11-06 DIAGNOSIS — R413 Other amnesia: Secondary | ICD-10-CM | POA: Diagnosis not present

## 2022-11-08 DIAGNOSIS — Z9884 Bariatric surgery status: Secondary | ICD-10-CM | POA: Diagnosis not present

## 2022-11-08 DIAGNOSIS — Z556 Problems related to health literacy: Secondary | ICD-10-CM | POA: Diagnosis not present

## 2022-11-08 DIAGNOSIS — Z7901 Long term (current) use of anticoagulants: Secondary | ICD-10-CM | POA: Diagnosis not present

## 2022-11-08 DIAGNOSIS — Z96653 Presence of artificial knee joint, bilateral: Secondary | ICD-10-CM | POA: Diagnosis not present

## 2022-11-08 DIAGNOSIS — Z7951 Long term (current) use of inhaled steroids: Secondary | ICD-10-CM | POA: Diagnosis not present

## 2022-11-08 DIAGNOSIS — M1990 Unspecified osteoarthritis, unspecified site: Secondary | ICD-10-CM | POA: Diagnosis not present

## 2022-11-08 DIAGNOSIS — Z79899 Other long term (current) drug therapy: Secondary | ICD-10-CM | POA: Diagnosis not present

## 2022-11-08 DIAGNOSIS — E785 Hyperlipidemia, unspecified: Secondary | ICD-10-CM | POA: Diagnosis not present

## 2022-11-08 DIAGNOSIS — G4730 Sleep apnea, unspecified: Secondary | ICD-10-CM | POA: Diagnosis not present

## 2022-11-08 DIAGNOSIS — Z7984 Long term (current) use of oral hypoglycemic drugs: Secondary | ICD-10-CM | POA: Diagnosis not present

## 2022-11-08 DIAGNOSIS — J4489 Other specified chronic obstructive pulmonary disease: Secondary | ICD-10-CM | POA: Diagnosis not present

## 2022-11-08 DIAGNOSIS — Z9841 Cataract extraction status, right eye: Secondary | ICD-10-CM | POA: Diagnosis not present

## 2022-11-08 DIAGNOSIS — Z9981 Dependence on supplemental oxygen: Secondary | ICD-10-CM | POA: Diagnosis not present

## 2022-11-08 DIAGNOSIS — Z993 Dependence on wheelchair: Secondary | ICD-10-CM | POA: Diagnosis not present

## 2022-11-08 DIAGNOSIS — E119 Type 2 diabetes mellitus without complications: Secondary | ICD-10-CM | POA: Diagnosis not present

## 2022-11-08 DIAGNOSIS — Z9049 Acquired absence of other specified parts of digestive tract: Secondary | ICD-10-CM | POA: Diagnosis not present

## 2022-11-08 DIAGNOSIS — I10 Essential (primary) hypertension: Secondary | ICD-10-CM | POA: Diagnosis not present

## 2022-11-08 DIAGNOSIS — Z9089 Acquired absence of other organs: Secondary | ICD-10-CM | POA: Diagnosis not present

## 2022-11-08 DIAGNOSIS — S42292D Other displaced fracture of upper end of left humerus, subsequent encounter for fracture with routine healing: Secondary | ICD-10-CM | POA: Diagnosis not present

## 2022-11-08 DIAGNOSIS — K5790 Diverticulosis of intestine, part unspecified, without perforation or abscess without bleeding: Secondary | ICD-10-CM | POA: Diagnosis not present

## 2022-11-08 DIAGNOSIS — K219 Gastro-esophageal reflux disease without esophagitis: Secondary | ICD-10-CM | POA: Diagnosis not present

## 2022-11-11 DIAGNOSIS — Z9981 Dependence on supplemental oxygen: Secondary | ICD-10-CM | POA: Diagnosis not present

## 2022-11-11 DIAGNOSIS — E785 Hyperlipidemia, unspecified: Secondary | ICD-10-CM | POA: Diagnosis not present

## 2022-11-11 DIAGNOSIS — Z9089 Acquired absence of other organs: Secondary | ICD-10-CM | POA: Diagnosis not present

## 2022-11-11 DIAGNOSIS — Z9841 Cataract extraction status, right eye: Secondary | ICD-10-CM | POA: Diagnosis not present

## 2022-11-11 DIAGNOSIS — J4489 Other specified chronic obstructive pulmonary disease: Secondary | ICD-10-CM | POA: Diagnosis not present

## 2022-11-11 DIAGNOSIS — S42292D Other displaced fracture of upper end of left humerus, subsequent encounter for fracture with routine healing: Secondary | ICD-10-CM | POA: Diagnosis not present

## 2022-11-11 DIAGNOSIS — Z556 Problems related to health literacy: Secondary | ICD-10-CM | POA: Diagnosis not present

## 2022-11-11 DIAGNOSIS — M1990 Unspecified osteoarthritis, unspecified site: Secondary | ICD-10-CM | POA: Diagnosis not present

## 2022-11-11 DIAGNOSIS — Z993 Dependence on wheelchair: Secondary | ICD-10-CM | POA: Diagnosis not present

## 2022-11-11 DIAGNOSIS — K219 Gastro-esophageal reflux disease without esophagitis: Secondary | ICD-10-CM | POA: Diagnosis not present

## 2022-11-11 DIAGNOSIS — Z96653 Presence of artificial knee joint, bilateral: Secondary | ICD-10-CM | POA: Diagnosis not present

## 2022-11-11 DIAGNOSIS — I10 Essential (primary) hypertension: Secondary | ICD-10-CM | POA: Diagnosis not present

## 2022-11-11 DIAGNOSIS — G4730 Sleep apnea, unspecified: Secondary | ICD-10-CM | POA: Diagnosis not present

## 2022-11-11 DIAGNOSIS — Z7984 Long term (current) use of oral hypoglycemic drugs: Secondary | ICD-10-CM | POA: Diagnosis not present

## 2022-11-11 DIAGNOSIS — K5790 Diverticulosis of intestine, part unspecified, without perforation or abscess without bleeding: Secondary | ICD-10-CM | POA: Diagnosis not present

## 2022-11-11 DIAGNOSIS — Z9884 Bariatric surgery status: Secondary | ICD-10-CM | POA: Diagnosis not present

## 2022-11-11 DIAGNOSIS — Z7951 Long term (current) use of inhaled steroids: Secondary | ICD-10-CM | POA: Diagnosis not present

## 2022-11-11 DIAGNOSIS — Z9049 Acquired absence of other specified parts of digestive tract: Secondary | ICD-10-CM | POA: Diagnosis not present

## 2022-11-11 DIAGNOSIS — Z7901 Long term (current) use of anticoagulants: Secondary | ICD-10-CM | POA: Diagnosis not present

## 2022-11-11 DIAGNOSIS — Z79899 Other long term (current) drug therapy: Secondary | ICD-10-CM | POA: Diagnosis not present

## 2022-11-11 DIAGNOSIS — E119 Type 2 diabetes mellitus without complications: Secondary | ICD-10-CM | POA: Diagnosis not present

## 2022-11-19 ENCOUNTER — Other Ambulatory Visit: Payer: Self-pay | Admitting: *Deleted

## 2022-11-19 ENCOUNTER — Other Ambulatory Visit: Payer: Self-pay

## 2022-11-19 NOTE — Patient Outreach (Signed)
Care Management   Visit Note  11/19/2022 Name: Jenna [name] MRN: [id number] DOB: 1935/03/11  Subjective: Sokhna Onion is a 87 y.o. year old female who is a primary care patient of Smitty Cords, DO. The Care Management team was consulted for assistance.      Engaged with patient spoke with patient by telephone.    Goals Addressed             This Visit's Progress    RNCM Effective management of Chronic Disease Management: DM, Depression, COPD, Memory Loss, and Chronic Pain       Current Barriers:  Knowledge Deficits related to plan of care for management of COPD, DMII, Chronic Pain, and Depression and Memory loss  Care Coordination needs related to Cognitive Deficits, Memory Deficits, Inability to perform ADL's independently, and Inability to perform IADL's independently  Chronic Disease Management support and education needs related to COPD, DMII, Chronic Pain, and memory loss, depression Cognitive Deficits Falls Loss of husband of 47 years on 08-25-2022  RNCM Clinical Goal(s):  Patient will verbalize basic understanding of COPD, DMII, and Chronic pain, memory changes, and depression disease process and self health management plan as evidenced by stable conditions, no acute exacerbations of conditions, working with the team for effective management of chronic conditions and disease progression. take all medications exactly as prescribed and will call provider for medication related questions as evidenced by compliance with medications    continue to work with RN Care Manager and/or Social Worker to address care management and care coordination needs related to COPD, DMII, and chronic pain, depression, and memory loss as evidenced by adherence to CM Team Scheduled appointments     demonstrate a decrease in COPD, DMII, and Chronic pain, memory loss, and depression exacerbations  as evidenced by effective management of chronic conditions and working with the team to  recognize changes impacting the patient for quick assessment and intervention through collaboration with Medical illustrator, provider, and care team.   Interventions: Evaluation of current treatment plan related to  self management and patient's adherence to plan as established by provider   COPD: (Status: Goal on Track (progressing): YES.) Long Term Goal  Reviewed medications with patient, including use of prescribed maintenance and rescue inhalers, and provided instruction on medication management and the importance of adherence. Has her medications. Her daughter assist with making sure she takes her medications as prescribed Provided patient with basic written and verbal COPD education on self care/management/and exacerbation prevention Advised patient to track and manage COPD triggers Provided written and verbal instructions on pursed lip breathing and utilized returned demonstration as teach back Provided instruction about proper use of medications used for management of COPD including inhalers Advised patient to self assesses COPD action plan zone and make appointment with provider if in the yellow zone for 48 hours without improvement Advised patient to engage in light exercise as tolerated 3-5 days a week to aid in the the management of COPD Provided education about and advised patient to utilize infection prevention strategies to reduce risk of respiratory infection Discussed the importance of adequate rest and management of fatigue with COPD Screening for signs and symptoms of depression related to chronic disease state  Assessed social determinant of health barriers  Diabetes:  (Status: Goal on Track (progressing): YES.) Long Term Goal   Lab Results  Component Value Date   HGBA1C 6.9 (A) 06/28/2022    Assessed patient's understanding of A1c goal: <7%. The patient is at goal.  Provided education to patient about basic DM disease process; Reviewed medications with patient and  discussed importance of medication adherence. Her daughter [name] who lives with her and assist in her care manages her medications;        Reviewed prescribed diet with patient heart healthy/ADA diet, per patient daughter patient is eating well but not always wanting to take her medication but she complies: Recipe to Help patients who have unintentional weight loss In a blender mix: 8 ounces of whole milk 1 pkt of Carnation instant breakfast ( vanilla works well 1 package of egg beaters 2 scoops of ice cream Fruit of your choice (bananna works well) This makes 3 to 4- 8 ounce cups. Can cover and keep in fridge or freeze  286 Calories 15 g of protein 41 g of carbs 8 g of fat ; Counseled on importance of regular laboratory monitoring as prescribed;        Discussed plans with patient for ongoing care management follow up and provided patient with direct contact information for care management team;      Provided patient with written educational materials related to hypo and hyperglycemia and importance of correct treatment;       Reviewed scheduled/upcoming provider appointments including: Patient will call if any needs arise       call provider for findings outside established parameters;       Referral made to social work team for assistance with review of resources available for the patient. Her daughter is living with her and her primary caregiver at this time due to decline in health and well being. The patients daughter ask about the CAP program and being able to qualify for the CAP program. Have made an appointment with the LCSW to follow up with the patient daughter to see if the patient would qualify for this. Reviewed with the patients daughter. Will talk to the LCSW and collaborate ;      Review of patient status, including review of consultants reports, relevant laboratory and other test results, and medications completed;       Advised patient to discuss acute changes in her DM  health and well being, questions or concerns with provider;      Screening for signs and symptoms of depression related to chronic disease state;        Assessed social determinant of health barriers;         Depression and Memory loss  (Status: Goal on Track (progressing): YES.) Long Term Goal  Evaluation of current treatment plan related to Depression and memory loss , Cognitive Deficits, Memory Deficits, Inability to perform ADL's independently, and Inability to perform IADL's independently self-management and patient's adherence to plan as established by provider. Discussed plans with patient for ongoing care management follow up and provided patient with direct contact information for care management team Advised patient to call the office for acute changes in the patients mental health, anxiety, depression, memory loss. The patient has had a decline in her memory and also just recently experienced the death of her husband Gaspar Garbe of 47 years. Her daughter states that she has noticed a great improvement in patient. She states that neurology started patient on Aricept and it has greatly helped patient mood, memory, cognition; Provided education to patient re: the book resource: The 36 hour day for resource for helping understand memory changes and patients with memory loss/dementia. Also gave information on Arelia Sneddon and her work with dementia as resources to help understand changes  the patients daughter is seeing in the patient.; Reviewed medications with patient and discussed compliance. The patient is compliant with medications. Her daughter is assisting her; Collaborated with pcp regarding request for hospital bed for the patient. Discussed talking to the insurance company. Also have information for DME suppliers and the [employer]: [phone]- Etheleen Nicks- contact person. She wants to wait on the hospital bed at this time. Education provided. ; Provided patient with information about  memory loss and resources available educational materials related to changes in memory and decline; Reviewed scheduled/upcoming provider appointments including saw the pcp by video visit yesterday. Knows to call for changes. The daughter was asking about paperwork for a handicap place card. The office has these and it is a fee for $20.00; Social Work referral for resources if needed. Social worker scheduled for 11-25-2022 at 1130 pm; Discussed plans with patient for ongoing care management follow up and provided patient with direct contact information for care management team; Advised patient to discuss changes in memory, acute changes and decline in condition, DME needs, questions, and concerns with provider; Screening for signs and symptoms of depression related to chronic disease state;  Assessed social determinant of health barriers;   Pain:  (Status: Goal on Track (progressing): YES.) Long Term Goal  Pain assessment performed.  Denies any new falls.  Medications reviewed- The patient has adequate pain medications Reviewed provider established plan for pain management; Discussed importance of adherence to all scheduled medical appointments; Counseled on the importance of reporting any/all new or changed pain symptoms or management strategies to pain management provider; Advised patient to report to care team affect of pain on daily activities; Discussed use of relaxation techniques and/or diversional activities to assist with pain reduction (distraction, imagery, relaxation, massage, acupressure, TENS, heat, and cold application; Reviewed with patient prescribed pharmacological and nonpharmacological pain relief strategies; Advised patient to discuss changes in level or intensity of pain, new concerns or questions, new PT needs with provider; Screening for signs and symptoms of depression related to chronic disease state;  Assessed social determinant of health barriers;   Patient  Goals/Self-Care Activities: Take medications as prescribed   Attend all scheduled provider appointments Call pharmacy for medication refills 3-7 days in advance of running out of medications Call provider office for new concerns or questions  Work with the social worker to address care coordination needs and will continue to work with the clinical team to address health care and disease management related needs Work with the pharmacist to address medication management needs and will continue to work with the clinical team to address health care and disease management related needs call the Suicide and Crisis Lifeline: 988 call the [place] National Suicide Prevention Lifeline: 312-517-3788 or TTY: 845-851-3499 TTY 470 086 1438) to talk to a trained counselor call 1-800-273-TALK (toll free, 24 hour hotline) if experiencing a Mental Health or Behavioral Health Crisis  check feet daily for cuts, sores or redness manage portion size keep feet up while sitting wash and dry feet carefully every day wear comfortable, cotton socks wear comfortable, well-fitting shoes avoid second hand smoke eliminate smoking in my home identify and remove indoor air pollutants limit outdoor activity during cold weather listen for public air quality announcements every day develop a rescue plan eliminate symptom triggers at home           Consent to Services:  Patient was given information about care management services, agreed to services, and gave verbal consent to participate.   Plan: Telephone follow  up appointment with care management team member scheduled for: 01-07-2023 at 11:45 am  Danise Edge, BSN RN RN Care Manager  [facility]  Ambulatory Care Management  Direct Number: [phone]

## 2022-11-19 NOTE — Patient Instructions (Signed)
Visit Information  Thank you for taking time to visit with me today. Please don't hesitate to contact me if I can be of assistance to you before our next scheduled telephone appointment.  Following are the goals we discussed today:   Goals Addressed             This Visit's Progress    RNCM Effective management of Chronic Disease Management: DM, Depression, COPD, Memory Loss, and Chronic Pain       Current Barriers:  Knowledge Deficits related to plan of care for management of COPD, DMII, Chronic Pain, and Depression and Memory loss  Care Coordination needs related to Cognitive Deficits, Memory Deficits, Inability to perform ADL's independently, and Inability to perform IADL's independently  Chronic Disease Management support and education needs related to COPD, DMII, Chronic Pain, and memory loss, depression Cognitive Deficits Falls Loss of husband of 47 years on 08-25-2022  RNCM Clinical Goal(s):  Patient will verbalize basic understanding of COPD, DMII, and Chronic pain, memory changes, and depression disease process and self health management plan as evidenced by stable conditions, no acute exacerbations of conditions, working with the team for effective management of chronic conditions and disease progression. take all medications exactly as prescribed and will call provider for medication related questions as evidenced by compliance with medications    continue to work with RN Care Manager and/or Social Worker to address care management and care coordination needs related to COPD, DMII, and chronic pain, depression, and memory loss as evidenced by adherence to CM Team Scheduled appointments     demonstrate a decrease in COPD, DMII, and Chronic pain, memory loss, and depression exacerbations  as evidenced by effective management of chronic conditions and working with the team to recognize changes impacting the patient for quick assessment and intervention through collaboration with Research officer, trade union, provider, and care team.   Interventions: Evaluation of current treatment plan related to  self management and patient's adherence to plan as established by provider   COPD: (Status: Goal on Track (progressing): YES.) Long Term Goal  Reviewed medications with patient, including use of prescribed maintenance and rescue inhalers, and provided instruction on medication management and the importance of adherence. Has her medications. Her daughter assist with making sure she takes her medications as prescribed Provided patient with basic written and verbal COPD education on self care/management/and exacerbation prevention Advised patient to track and manage COPD triggers Provided written and verbal instructions on pursed lip breathing and utilized returned demonstration as teach back Provided instruction about proper use of medications used for management of COPD including inhalers Advised patient to self assesses COPD action plan zone and make appointment with provider if in the yellow zone for 48 hours without improvement Advised patient to engage in light exercise as tolerated 3-5 days a week to aid in the the management of COPD Provided education about and advised patient to utilize infection prevention strategies to reduce risk of respiratory infection Discussed the importance of adequate rest and management of fatigue with COPD Screening for signs and symptoms of depression related to chronic disease state  Assessed social determinant of health barriers  Diabetes:  (Status: Goal on Track (progressing): YES.) Long Term Goal   Lab Results  Component Value Date   HGBA1C 6.9 (A) 06/28/2022    Assessed patient's understanding of A1c goal: <7%. The patient is at goal. Provided education to patient about basic DM disease process; Reviewed medications with patient and discussed importance of medication adherence. Her  daughter [name] who lives with her and assist in her care manages  her medications;        Reviewed prescribed diet with patient heart healthy/ADA diet, per patient daughter patient is eating well but not always wanting to take her medication but she complies: Recipe to Help patients who have unintentional weight loss In a blender mix: 8 ounces of whole milk 1 pkt of Carnation instant breakfast ( vanilla works well 1 package of egg beaters 2 scoops of ice cream Fruit of your choice (bananna works well) This makes 3 to 4- 8 ounce cups. Can cover and keep in fridge or freeze  286 Calories 15 g of protein 41 g of carbs 8 g of fat ; Counseled on importance of regular laboratory monitoring as prescribed;        Discussed plans with patient for ongoing care management follow up and provided patient with direct contact information for care management team;      Provided patient with written educational materials related to hypo and hyperglycemia and importance of correct treatment;       Reviewed scheduled/upcoming provider appointments including: Patient will call if any needs arise       call provider for findings outside established parameters;       Referral made to social work team for assistance with review of resources available for the patient. Her daughter is living with her and her primary caregiver at this time due to decline in health and well being. The patients daughter ask about the CAP program and being able to qualify for the CAP program. Have made an appointment with the LCSW to follow up with the patient daughter to see if the patient would qualify for this. Reviewed with the patients daughter. Will talk to the LCSW and collaborate ;      Review of patient status, including review of consultants reports, relevant laboratory and other test results, and medications completed;       Advised patient to discuss acute changes in her DM health and well being, questions or concerns with provider;      Screening for signs and symptoms of depression related  to chronic disease state;        Assessed social determinant of health barriers;         Depression and Memory loss  (Status: Goal on Track (progressing): YES.) Long Term Goal  Evaluation of current treatment plan related to Depression and memory loss , Cognitive Deficits, Memory Deficits, Inability to perform ADL's independently, and Inability to perform IADL's independently self-management and patient's adherence to plan as established by provider. Discussed plans with patient for ongoing care management follow up and provided patient with direct contact information for care management team Advised patient to call the office for acute changes in the patients mental health, anxiety, depression, memory loss. The patient has had a decline in her memory and also just recently experienced the death of her husband Gaspar Garbe of 47 years. Her daughter states that she has noticed a great improvement in patient. She states that neurology started patient on Aricept and it has greatly helped patient mood, memory, cognition; Provided education to patient re: the book resource: The 36 hour day for resource for helping understand memory changes and patients with memory loss/dementia. Also gave information on Arelia Sneddon and her work with dementia as resources to help understand changes the patients daughter is seeing in the patient.; Reviewed medications with patient and discussed compliance. The patient is compliant with  medications. Her daughter is assisting her; Collaborated with pcp regarding request for hospital bed for the patient. Discussed talking to the insurance company. Also have information for DME suppliers and the [employer]: [phone]- Etheleen Nicks- contact person. She wants to wait on the hospital bed at this time. Education provided. ; Provided patient with information about memory loss and resources available educational materials related to changes in memory and decline; Reviewed  scheduled/upcoming provider appointments including saw the pcp by video visit yesterday. Knows to call for changes. The daughter was asking about paperwork for a handicap place card. The office has these and it is a fee for $20.00; Social Work referral for resources if needed. Social worker scheduled for 11-25-2022 at 1130 pm; Discussed plans with patient for ongoing care management follow up and provided patient with direct contact information for care management team; Advised patient to discuss changes in memory, acute changes and decline in condition, DME needs, questions, and concerns with provider; Screening for signs and symptoms of depression related to chronic disease state;  Assessed social determinant of health barriers;   Pain:  (Status: Goal on Track (progressing): YES.) Long Term Goal  Pain assessment performed.  Denies any new falls.  Medications reviewed- The patient has adequate pain medications Reviewed provider established plan for pain management; Discussed importance of adherence to all scheduled medical appointments; Counseled on the importance of reporting any/all new or changed pain symptoms or management strategies to pain management provider; Advised patient to report to care team affect of pain on daily activities; Discussed use of relaxation techniques and/or diversional activities to assist with pain reduction (distraction, imagery, relaxation, massage, acupressure, TENS, heat, and cold application; Reviewed with patient prescribed pharmacological and nonpharmacological pain relief strategies; Advised patient to discuss changes in level or intensity of pain, new concerns or questions, new PT needs with provider; Screening for signs and symptoms of depression related to chronic disease state;  Assessed social determinant of health barriers;   Patient Goals/Self-Care Activities: Take medications as prescribed   Attend all scheduled provider appointments Call pharmacy  for medication refills 3-7 days in advance of running out of medications Call provider office for new concerns or questions  Work with the social worker to address care coordination needs and will continue to work with the clinical team to address health care and disease management related needs Work with the pharmacist to address medication management needs and will continue to work with the clinical team to address health care and disease management related needs call the Suicide and Crisis Lifeline: 988 call the [place] National Suicide Prevention Lifeline: 780-134-9342 or TTY: (385)299-3633 TTY (314)761-1346) to talk to a trained counselor call 1-800-273-TALK (toll free, 24 hour hotline) if experiencing a Mental Health or Behavioral Health Crisis  check feet daily for cuts, sores or redness manage portion size keep feet up while sitting wash and dry feet carefully every day wear comfortable, cotton socks wear comfortable, well-fitting shoes avoid second hand smoke eliminate smoking in my home identify and remove indoor air pollutants limit outdoor activity during cold weather listen for public air quality announcements every day develop a rescue plan eliminate symptom triggers at home           Our next appointment is by telephone on 01-07-2023 at 11:45 am  Please call the care guide team at [phone] if you need to cancel or reschedule your appointment.   If you are experiencing a Mental Health or Behavioral Health Crisis or need someone to  talk to, please call the Suicide and Crisis Lifeline: 988 call the [place] National Suicide Prevention Lifeline: 901-749-2189 or TTY: 212-141-7397 TTY 737-389-3373) to talk to a trained counselor call 1-800-273-TALK (toll free, 24 hour hotline) call 911   Patient verbalizes understanding of instructions and care plan provided today and agrees to view in MyChart. Active MyChart status and patient understanding of how to access instructions  and care plan via MyChart confirmed with patient.     Telephone follow up appointment with care management team member scheduled for: 01-07-2023 at 11:45 am  Danise Edge, BSN RN RN Care Manager  [facility]  Ambulatory Care Management  Direct Number: [phone]

## 2022-11-24 DIAGNOSIS — I10 Essential (primary) hypertension: Secondary | ICD-10-CM | POA: Diagnosis not present

## 2022-11-25 ENCOUNTER — Ambulatory Visit: Payer: Self-pay | Admitting: *Deleted

## 2022-11-25 NOTE — Patient Outreach (Signed)
  Care Coordination   Initial Visit Note   11/25/2022 Name: Jenna [name] MRN: [id number] DOB: 1935/12/05  Jenna [name] is a 87 y.o. year old female who sees Smitty Cords, DO for primary care. I spoke with  Jenna [name] daughter by phone today.  What matters to the patients health and wellness today?  Community resource options to meet patient's daily care needs.    Goals Addressed             This Visit's Progress    commmunity resources related to in home care       Activities and task to complete in order to accomplish goals.   TASK TO ACCOMPLISH GOAL Call the [facility]  to follow up on possible attendance         SDOH assessments and interventions completed:  Yes  SDOH Interventions Today    Flowsheet Row Most Recent Value  SDOH Interventions   Food Insecurity Interventions Intervention Not Indicated  Housing Interventions Intervention Not Indicated  Transportation Interventions Intervention Not Indicated  Utilities Interventions Intervention Not Indicated        Care Coordination Interventions:  Yes, provided  Interventions Today    Flowsheet Row Most Recent Value  Chronic Disease   Chronic disease during today's visit Other  [dementia, anxiety]  General Interventions   General Interventions Discussed/Reviewed Level of Care, General Interventions Discussed  [needs assessment completed confirming need for care assistance while she works-no family or friends availalble to assist-PT and OT remain active]  Level of Care Adult Daycare, Personal Care Services, Skilled Nursing Facility  [Discussed opitns for Adult Day Care-provided contact informaiton for [facility]-daughter declines optins for out of placement]  Mental Health Interventions   Mental Health Discussed/Reviewed Mental Health Discussed  [caregiver stress acknowledged, self care emphasized, encouraged follow up with [facility]]        Follow up plan: Follow up call scheduled for 12/06/22    Encounter Outcome:  Patient Visit Completed

## 2022-11-25 NOTE — Patient Instructions (Signed)
Visit Information  Thank you for taking time to visit with me today. Please don't hesitate to contact me if I can be of assistance to you.   Following are the goals we discussed today:   Goals Addressed             This Visit's Progress    commmunity resources related to in home care       Activities and task to complete in order to accomplish goals.   TASK TO ACCOMPLISH GOAL Call the [facility]  to follow up on possible attendance         Our next appointment is by telephone on 12/06/22 at 1pm  Please call the care guide team at [phone] if you need to cancel or reschedule your appointment.   If you are experiencing a Mental Health or Behavioral Health Crisis or need someone to talk to, please call 911   Patient verbalizes understanding of instructions and care plan provided today and agrees to view in MyChart. Active MyChart status and patient understanding of how to access instructions and care plan via MyChart confirmed with patient.     Telephone follow up appointment with care management team member scheduled for: 12/06/22  Verna Czech, LCSW [place]  [facility], [facility] Licensed Clinical Social Worker Care Coordinator  Direct Dial: [phone]

## 2022-11-29 DIAGNOSIS — Z993 Dependence on wheelchair: Secondary | ICD-10-CM | POA: Diagnosis not present

## 2022-11-29 DIAGNOSIS — Z9841 Cataract extraction status, right eye: Secondary | ICD-10-CM | POA: Diagnosis not present

## 2022-11-29 DIAGNOSIS — E119 Type 2 diabetes mellitus without complications: Secondary | ICD-10-CM | POA: Diagnosis not present

## 2022-11-29 DIAGNOSIS — Z556 Problems related to health literacy: Secondary | ICD-10-CM | POA: Diagnosis not present

## 2022-11-29 DIAGNOSIS — K219 Gastro-esophageal reflux disease without esophagitis: Secondary | ICD-10-CM | POA: Diagnosis not present

## 2022-11-29 DIAGNOSIS — Z79899 Other long term (current) drug therapy: Secondary | ICD-10-CM | POA: Diagnosis not present

## 2022-11-29 DIAGNOSIS — Z7984 Long term (current) use of oral hypoglycemic drugs: Secondary | ICD-10-CM | POA: Diagnosis not present

## 2022-11-29 DIAGNOSIS — Z9981 Dependence on supplemental oxygen: Secondary | ICD-10-CM | POA: Diagnosis not present

## 2022-11-29 DIAGNOSIS — Z9089 Acquired absence of other organs: Secondary | ICD-10-CM | POA: Diagnosis not present

## 2022-11-29 DIAGNOSIS — G4730 Sleep apnea, unspecified: Secondary | ICD-10-CM | POA: Diagnosis not present

## 2022-11-29 DIAGNOSIS — Z7901 Long term (current) use of anticoagulants: Secondary | ICD-10-CM | POA: Diagnosis not present

## 2022-11-29 DIAGNOSIS — Z96653 Presence of artificial knee joint, bilateral: Secondary | ICD-10-CM | POA: Diagnosis not present

## 2022-11-29 DIAGNOSIS — E785 Hyperlipidemia, unspecified: Secondary | ICD-10-CM | POA: Diagnosis not present

## 2022-11-29 DIAGNOSIS — M1990 Unspecified osteoarthritis, unspecified site: Secondary | ICD-10-CM | POA: Diagnosis not present

## 2022-11-29 DIAGNOSIS — Z7951 Long term (current) use of inhaled steroids: Secondary | ICD-10-CM | POA: Diagnosis not present

## 2022-11-29 DIAGNOSIS — J4489 Other specified chronic obstructive pulmonary disease: Secondary | ICD-10-CM | POA: Diagnosis not present

## 2022-11-29 DIAGNOSIS — S42292D Other displaced fracture of upper end of left humerus, subsequent encounter for fracture with routine healing: Secondary | ICD-10-CM | POA: Diagnosis not present

## 2022-11-29 DIAGNOSIS — Z9049 Acquired absence of other specified parts of digestive tract: Secondary | ICD-10-CM | POA: Diagnosis not present

## 2022-11-29 DIAGNOSIS — Z9884 Bariatric surgery status: Secondary | ICD-10-CM | POA: Diagnosis not present

## 2022-11-29 DIAGNOSIS — I10 Essential (primary) hypertension: Secondary | ICD-10-CM | POA: Diagnosis not present

## 2022-11-29 DIAGNOSIS — K5790 Diverticulosis of intestine, part unspecified, without perforation or abscess without bleeding: Secondary | ICD-10-CM | POA: Diagnosis not present

## 2022-12-02 DIAGNOSIS — Z7951 Long term (current) use of inhaled steroids: Secondary | ICD-10-CM | POA: Diagnosis not present

## 2022-12-02 DIAGNOSIS — Z9089 Acquired absence of other organs: Secondary | ICD-10-CM | POA: Diagnosis not present

## 2022-12-02 DIAGNOSIS — I10 Essential (primary) hypertension: Secondary | ICD-10-CM | POA: Diagnosis not present

## 2022-12-02 DIAGNOSIS — S42292D Other displaced fracture of upper end of left humerus, subsequent encounter for fracture with routine healing: Secondary | ICD-10-CM | POA: Diagnosis not present

## 2022-12-02 DIAGNOSIS — Z9049 Acquired absence of other specified parts of digestive tract: Secondary | ICD-10-CM | POA: Diagnosis not present

## 2022-12-02 DIAGNOSIS — M1990 Unspecified osteoarthritis, unspecified site: Secondary | ICD-10-CM | POA: Diagnosis not present

## 2022-12-02 DIAGNOSIS — K5790 Diverticulosis of intestine, part unspecified, without perforation or abscess without bleeding: Secondary | ICD-10-CM | POA: Diagnosis not present

## 2022-12-02 DIAGNOSIS — Z7901 Long term (current) use of anticoagulants: Secondary | ICD-10-CM | POA: Diagnosis not present

## 2022-12-02 DIAGNOSIS — Z9841 Cataract extraction status, right eye: Secondary | ICD-10-CM | POA: Diagnosis not present

## 2022-12-02 DIAGNOSIS — J4489 Other specified chronic obstructive pulmonary disease: Secondary | ICD-10-CM | POA: Diagnosis not present

## 2022-12-02 DIAGNOSIS — E785 Hyperlipidemia, unspecified: Secondary | ICD-10-CM | POA: Diagnosis not present

## 2022-12-02 DIAGNOSIS — Z556 Problems related to health literacy: Secondary | ICD-10-CM | POA: Diagnosis not present

## 2022-12-02 DIAGNOSIS — Z9884 Bariatric surgery status: Secondary | ICD-10-CM | POA: Diagnosis not present

## 2022-12-02 DIAGNOSIS — Z7984 Long term (current) use of oral hypoglycemic drugs: Secondary | ICD-10-CM | POA: Diagnosis not present

## 2022-12-02 DIAGNOSIS — Z96653 Presence of artificial knee joint, bilateral: Secondary | ICD-10-CM | POA: Diagnosis not present

## 2022-12-02 DIAGNOSIS — Z9981 Dependence on supplemental oxygen: Secondary | ICD-10-CM | POA: Diagnosis not present

## 2022-12-02 DIAGNOSIS — Z993 Dependence on wheelchair: Secondary | ICD-10-CM | POA: Diagnosis not present

## 2022-12-02 DIAGNOSIS — G4730 Sleep apnea, unspecified: Secondary | ICD-10-CM | POA: Diagnosis not present

## 2022-12-02 DIAGNOSIS — E119 Type 2 diabetes mellitus without complications: Secondary | ICD-10-CM | POA: Diagnosis not present

## 2022-12-02 DIAGNOSIS — Z79899 Other long term (current) drug therapy: Secondary | ICD-10-CM | POA: Diagnosis not present

## 2022-12-02 DIAGNOSIS — K219 Gastro-esophageal reflux disease without esophagitis: Secondary | ICD-10-CM | POA: Diagnosis not present

## 2022-12-03 DIAGNOSIS — Z96653 Presence of artificial knee joint, bilateral: Secondary | ICD-10-CM | POA: Diagnosis not present

## 2022-12-03 DIAGNOSIS — Z9884 Bariatric surgery status: Secondary | ICD-10-CM | POA: Diagnosis not present

## 2022-12-03 DIAGNOSIS — M1990 Unspecified osteoarthritis, unspecified site: Secondary | ICD-10-CM | POA: Diagnosis not present

## 2022-12-03 DIAGNOSIS — Z993 Dependence on wheelchair: Secondary | ICD-10-CM | POA: Diagnosis not present

## 2022-12-03 DIAGNOSIS — Z7951 Long term (current) use of inhaled steroids: Secondary | ICD-10-CM | POA: Diagnosis not present

## 2022-12-03 DIAGNOSIS — E785 Hyperlipidemia, unspecified: Secondary | ICD-10-CM | POA: Diagnosis not present

## 2022-12-03 DIAGNOSIS — Z556 Problems related to health literacy: Secondary | ICD-10-CM | POA: Diagnosis not present

## 2022-12-03 DIAGNOSIS — E119 Type 2 diabetes mellitus without complications: Secondary | ICD-10-CM | POA: Diagnosis not present

## 2022-12-03 DIAGNOSIS — I10 Essential (primary) hypertension: Secondary | ICD-10-CM | POA: Diagnosis not present

## 2022-12-03 DIAGNOSIS — Z9089 Acquired absence of other organs: Secondary | ICD-10-CM | POA: Diagnosis not present

## 2022-12-03 DIAGNOSIS — Z9841 Cataract extraction status, right eye: Secondary | ICD-10-CM | POA: Diagnosis not present

## 2022-12-03 DIAGNOSIS — K5790 Diverticulosis of intestine, part unspecified, without perforation or abscess without bleeding: Secondary | ICD-10-CM | POA: Diagnosis not present

## 2022-12-03 DIAGNOSIS — Z7901 Long term (current) use of anticoagulants: Secondary | ICD-10-CM | POA: Diagnosis not present

## 2022-12-03 DIAGNOSIS — S42292D Other displaced fracture of upper end of left humerus, subsequent encounter for fracture with routine healing: Secondary | ICD-10-CM | POA: Diagnosis not present

## 2022-12-03 DIAGNOSIS — G4730 Sleep apnea, unspecified: Secondary | ICD-10-CM | POA: Diagnosis not present

## 2022-12-03 DIAGNOSIS — Z9049 Acquired absence of other specified parts of digestive tract: Secondary | ICD-10-CM | POA: Diagnosis not present

## 2022-12-03 DIAGNOSIS — J4489 Other specified chronic obstructive pulmonary disease: Secondary | ICD-10-CM | POA: Diagnosis not present

## 2022-12-03 DIAGNOSIS — Z9981 Dependence on supplemental oxygen: Secondary | ICD-10-CM | POA: Diagnosis not present

## 2022-12-03 DIAGNOSIS — K219 Gastro-esophageal reflux disease without esophagitis: Secondary | ICD-10-CM | POA: Diagnosis not present

## 2022-12-03 DIAGNOSIS — Z7984 Long term (current) use of oral hypoglycemic drugs: Secondary | ICD-10-CM | POA: Diagnosis not present

## 2022-12-03 DIAGNOSIS — Z79899 Other long term (current) drug therapy: Secondary | ICD-10-CM | POA: Diagnosis not present

## 2022-12-05 IMAGING — DX DG CHEST 1V PORT
1 series · 1 of 1 positions shown · non-contrast
Comparison: 03/01/2021

CLINICAL DATA: Shortness of breath

EXAM:
PORTABLE CHEST 1 VIEW

[chest ap]
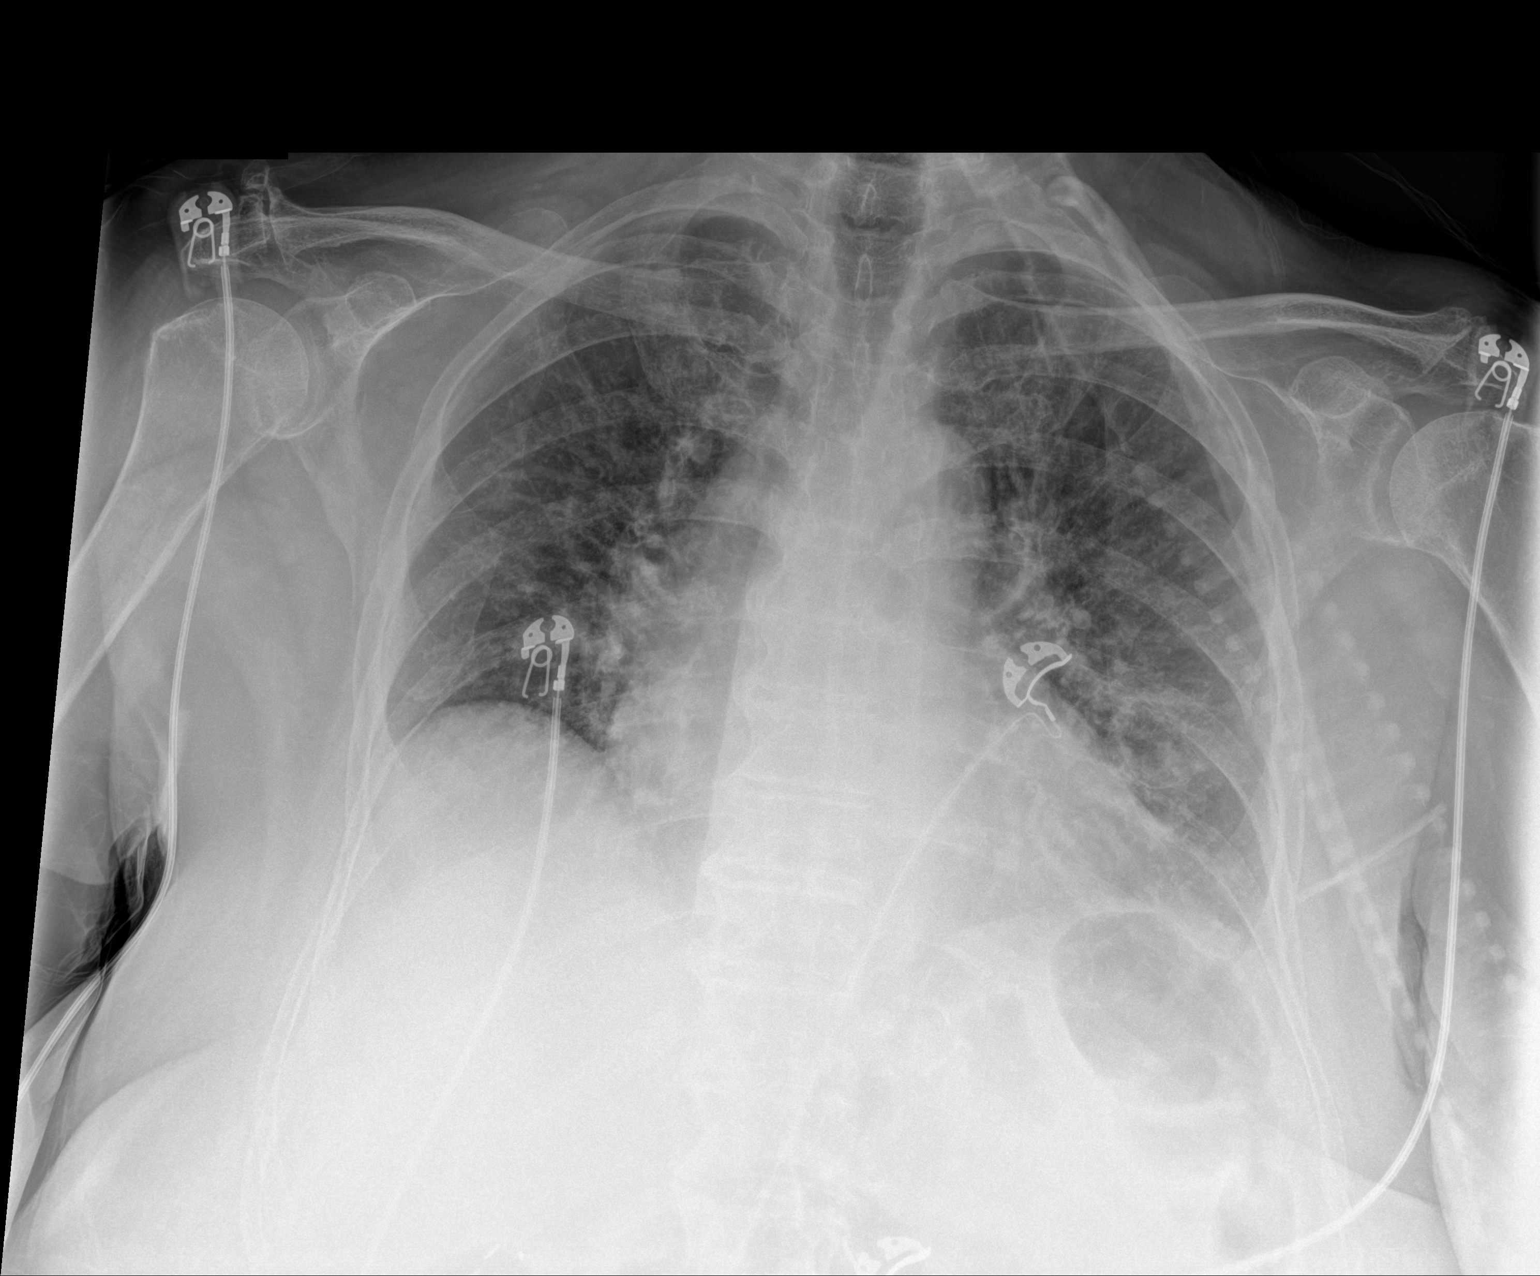

[1 of 1 positions shown; findings below may reference images not displayed]

FINDINGS: Low lung volumes. Heart is borderline in size with mild vascular
congestion and peribronchial thickening. No confluent airspace
opacities, effusions or edema. No acute bony abnormality.
IMPRESSION: Borderline heart size, mild vascular congestion.

## 2022-12-06 ENCOUNTER — Ambulatory Visit: Payer: Self-pay | Admitting: *Deleted

## 2022-12-06 NOTE — Patient Outreach (Signed)
  Care Coordination   Follow Up Visit Note   12/06/2022 Name: Jenna [name] MRN: [id number] DOB: 1935/05/02  Jenna [name] is a 87 y.o. year old female who sees Smitty Cords, DO for primary care. I spoke with  Sissy Hoff Cadotte's daughter by phone today.  What matters to the patients health and wellness today?  Community resources to address in home care needs and socialization. Patient's daughter agreeable to following up with the [facility] to schedule a tour. Patient's daughter also agreeable to following up with insurance company regarding coverage for personal cares services. Patient's daughter reports having no additional community resource needs at time    Goals Addressed             This Visit's Progress    commmunity resources related to in home care       Activities and task to complete in order to accomplish goals.   TASK TO ACCOMPLISH GOAL Patient 's daughter to call the [facility] to schedule a tour for  possible attendance Patient's daughter to call insurance company to confirm coverage for personal care services          SDOH assessments and interventions completed:  No     Care Coordination Interventions:  Yes, provided  Interventions Today    Flowsheet Row Most Recent Value  Chronic Disease   Chronic disease during today's visit Other  [dementia, anxiety]  General Interventions   General Interventions Discussed/Reviewed General Interventions Reviewed, Level of Care  [Needs assessment  completed-PT/OT services extended through-[facility]]  Level of Care Adult Daycare, Personal Care Services  [discussed option for Adult Day Programs as well as personal care services-pt declines adult day program-daughter will follow up with insurance re: coverage for private duty care-willing to consider this option]  Mental Health Interventions   Mental Health Discussed/Reviewed Mental Health Discussed  [caregiver stress  continues to be acknowledged, self care emphasized]       Follow up plan: No further intervention required.   Encounter Outcome:  Patient Visit Completed

## 2022-12-06 NOTE — Patient Instructions (Signed)
Visit Information  Thank you for taking time to visit with me today. Please don't hesitate to contact me if I can be of assistance to you.   Following are the goals we discussed today:   Goals Addressed             This Visit's Progress    commmunity resources related to in home care       Activities and task to complete in order to accomplish goals.   TASK TO ACCOMPLISH GOAL Patient 's daughter to call the [facility] to schedule a tour for  possible attendance Patient's daughter to call insurance company to confirm coverage for personal care services          If you are experiencing a Mental Health or Behavioral Health Crisis or need someone to talk to, please call 911   Patient verbalizes understanding of instructions and care plan provided today and agrees to view in [place]. Active MyChart status and patient understanding of how to access instructions and care plan via MyChart confirmed with patient.     No further follow up required: patient to contact this Child psychotherapist with any additional community resource needs     [employer], [employer] [place]  [facility], [facility] Licensed Clinical Social Geologist, engineering Dial: [phone]

## 2022-12-11 DIAGNOSIS — Z9884 Bariatric surgery status: Secondary | ICD-10-CM | POA: Diagnosis not present

## 2022-12-11 DIAGNOSIS — I10 Essential (primary) hypertension: Secondary | ICD-10-CM | POA: Diagnosis not present

## 2022-12-11 DIAGNOSIS — Z556 Problems related to health literacy: Secondary | ICD-10-CM | POA: Diagnosis not present

## 2022-12-11 DIAGNOSIS — Z9089 Acquired absence of other organs: Secondary | ICD-10-CM | POA: Diagnosis not present

## 2022-12-11 DIAGNOSIS — G4730 Sleep apnea, unspecified: Secondary | ICD-10-CM | POA: Diagnosis not present

## 2022-12-11 DIAGNOSIS — J4489 Other specified chronic obstructive pulmonary disease: Secondary | ICD-10-CM | POA: Diagnosis not present

## 2022-12-11 DIAGNOSIS — Z79899 Other long term (current) drug therapy: Secondary | ICD-10-CM | POA: Diagnosis not present

## 2022-12-11 DIAGNOSIS — M1990 Unspecified osteoarthritis, unspecified site: Secondary | ICD-10-CM | POA: Diagnosis not present

## 2022-12-11 DIAGNOSIS — E119 Type 2 diabetes mellitus without complications: Secondary | ICD-10-CM | POA: Diagnosis not present

## 2022-12-11 DIAGNOSIS — Z7984 Long term (current) use of oral hypoglycemic drugs: Secondary | ICD-10-CM | POA: Diagnosis not present

## 2022-12-11 DIAGNOSIS — K219 Gastro-esophageal reflux disease without esophagitis: Secondary | ICD-10-CM | POA: Diagnosis not present

## 2022-12-11 DIAGNOSIS — E785 Hyperlipidemia, unspecified: Secondary | ICD-10-CM | POA: Diagnosis not present

## 2022-12-11 DIAGNOSIS — Z96653 Presence of artificial knee joint, bilateral: Secondary | ICD-10-CM | POA: Diagnosis not present

## 2022-12-11 DIAGNOSIS — K5790 Diverticulosis of intestine, part unspecified, without perforation or abscess without bleeding: Secondary | ICD-10-CM | POA: Diagnosis not present

## 2022-12-11 DIAGNOSIS — Z7901 Long term (current) use of anticoagulants: Secondary | ICD-10-CM | POA: Diagnosis not present

## 2022-12-11 DIAGNOSIS — Z9049 Acquired absence of other specified parts of digestive tract: Secondary | ICD-10-CM | POA: Diagnosis not present

## 2022-12-11 DIAGNOSIS — Z993 Dependence on wheelchair: Secondary | ICD-10-CM | POA: Diagnosis not present

## 2022-12-11 DIAGNOSIS — Z7951 Long term (current) use of inhaled steroids: Secondary | ICD-10-CM | POA: Diagnosis not present

## 2022-12-11 DIAGNOSIS — Z9841 Cataract extraction status, right eye: Secondary | ICD-10-CM | POA: Diagnosis not present

## 2022-12-11 DIAGNOSIS — S42292D Other displaced fracture of upper end of left humerus, subsequent encounter for fracture with routine healing: Secondary | ICD-10-CM | POA: Diagnosis not present

## 2022-12-11 DIAGNOSIS — Z9981 Dependence on supplemental oxygen: Secondary | ICD-10-CM | POA: Diagnosis not present

## 2022-12-12 DIAGNOSIS — Z993 Dependence on wheelchair: Secondary | ICD-10-CM | POA: Diagnosis not present

## 2022-12-12 DIAGNOSIS — E785 Hyperlipidemia, unspecified: Secondary | ICD-10-CM | POA: Diagnosis not present

## 2022-12-12 DIAGNOSIS — I10 Essential (primary) hypertension: Secondary | ICD-10-CM | POA: Diagnosis not present

## 2022-12-12 DIAGNOSIS — Z96653 Presence of artificial knee joint, bilateral: Secondary | ICD-10-CM | POA: Diagnosis not present

## 2022-12-12 DIAGNOSIS — Z7901 Long term (current) use of anticoagulants: Secondary | ICD-10-CM | POA: Diagnosis not present

## 2022-12-12 DIAGNOSIS — K219 Gastro-esophageal reflux disease without esophagitis: Secondary | ICD-10-CM | POA: Diagnosis not present

## 2022-12-12 DIAGNOSIS — Z556 Problems related to health literacy: Secondary | ICD-10-CM | POA: Diagnosis not present

## 2022-12-12 DIAGNOSIS — Z9841 Cataract extraction status, right eye: Secondary | ICD-10-CM | POA: Diagnosis not present

## 2022-12-12 DIAGNOSIS — Z9884 Bariatric surgery status: Secondary | ICD-10-CM | POA: Diagnosis not present

## 2022-12-12 DIAGNOSIS — G4730 Sleep apnea, unspecified: Secondary | ICD-10-CM | POA: Diagnosis not present

## 2022-12-12 DIAGNOSIS — Z9981 Dependence on supplemental oxygen: Secondary | ICD-10-CM | POA: Diagnosis not present

## 2022-12-12 DIAGNOSIS — S42292D Other displaced fracture of upper end of left humerus, subsequent encounter for fracture with routine healing: Secondary | ICD-10-CM | POA: Diagnosis not present

## 2022-12-12 DIAGNOSIS — Z9089 Acquired absence of other organs: Secondary | ICD-10-CM | POA: Diagnosis not present

## 2022-12-12 DIAGNOSIS — Z7951 Long term (current) use of inhaled steroids: Secondary | ICD-10-CM | POA: Diagnosis not present

## 2022-12-12 DIAGNOSIS — Z9049 Acquired absence of other specified parts of digestive tract: Secondary | ICD-10-CM | POA: Diagnosis not present

## 2022-12-12 DIAGNOSIS — Z79899 Other long term (current) drug therapy: Secondary | ICD-10-CM | POA: Diagnosis not present

## 2022-12-12 DIAGNOSIS — K5790 Diverticulosis of intestine, part unspecified, without perforation or abscess without bleeding: Secondary | ICD-10-CM | POA: Diagnosis not present

## 2022-12-12 DIAGNOSIS — M1990 Unspecified osteoarthritis, unspecified site: Secondary | ICD-10-CM | POA: Diagnosis not present

## 2022-12-12 DIAGNOSIS — J4489 Other specified chronic obstructive pulmonary disease: Secondary | ICD-10-CM | POA: Diagnosis not present

## 2022-12-12 DIAGNOSIS — E119 Type 2 diabetes mellitus without complications: Secondary | ICD-10-CM | POA: Diagnosis not present

## 2022-12-12 DIAGNOSIS — Z7984 Long term (current) use of oral hypoglycemic drugs: Secondary | ICD-10-CM | POA: Diagnosis not present

## 2022-12-13 DIAGNOSIS — S42292D Other displaced fracture of upper end of left humerus, subsequent encounter for fracture with routine healing: Secondary | ICD-10-CM | POA: Diagnosis not present

## 2022-12-20 DIAGNOSIS — K5790 Diverticulosis of intestine, part unspecified, without perforation or abscess without bleeding: Secondary | ICD-10-CM | POA: Diagnosis not present

## 2022-12-20 DIAGNOSIS — I10 Essential (primary) hypertension: Secondary | ICD-10-CM | POA: Diagnosis not present

## 2022-12-20 DIAGNOSIS — G4730 Sleep apnea, unspecified: Secondary | ICD-10-CM | POA: Diagnosis not present

## 2022-12-20 DIAGNOSIS — K219 Gastro-esophageal reflux disease without esophagitis: Secondary | ICD-10-CM | POA: Diagnosis not present

## 2022-12-20 DIAGNOSIS — Z9049 Acquired absence of other specified parts of digestive tract: Secondary | ICD-10-CM | POA: Diagnosis not present

## 2022-12-20 DIAGNOSIS — Z7984 Long term (current) use of oral hypoglycemic drugs: Secondary | ICD-10-CM | POA: Diagnosis not present

## 2022-12-20 DIAGNOSIS — Z7951 Long term (current) use of inhaled steroids: Secondary | ICD-10-CM | POA: Diagnosis not present

## 2022-12-20 DIAGNOSIS — Z9841 Cataract extraction status, right eye: Secondary | ICD-10-CM | POA: Diagnosis not present

## 2022-12-20 DIAGNOSIS — Z9089 Acquired absence of other organs: Secondary | ICD-10-CM | POA: Diagnosis not present

## 2022-12-20 DIAGNOSIS — M1990 Unspecified osteoarthritis, unspecified site: Secondary | ICD-10-CM | POA: Diagnosis not present

## 2022-12-20 DIAGNOSIS — E119 Type 2 diabetes mellitus without complications: Secondary | ICD-10-CM | POA: Diagnosis not present

## 2022-12-20 DIAGNOSIS — Z9981 Dependence on supplemental oxygen: Secondary | ICD-10-CM | POA: Diagnosis not present

## 2022-12-20 DIAGNOSIS — Z556 Problems related to health literacy: Secondary | ICD-10-CM | POA: Diagnosis not present

## 2022-12-20 DIAGNOSIS — Z7901 Long term (current) use of anticoagulants: Secondary | ICD-10-CM | POA: Diagnosis not present

## 2022-12-20 DIAGNOSIS — Z993 Dependence on wheelchair: Secondary | ICD-10-CM | POA: Diagnosis not present

## 2022-12-20 DIAGNOSIS — Z9884 Bariatric surgery status: Secondary | ICD-10-CM | POA: Diagnosis not present

## 2022-12-20 DIAGNOSIS — S42292D Other displaced fracture of upper end of left humerus, subsequent encounter for fracture with routine healing: Secondary | ICD-10-CM | POA: Diagnosis not present

## 2022-12-20 DIAGNOSIS — J4489 Other specified chronic obstructive pulmonary disease: Secondary | ICD-10-CM | POA: Diagnosis not present

## 2022-12-20 DIAGNOSIS — E785 Hyperlipidemia, unspecified: Secondary | ICD-10-CM | POA: Diagnosis not present

## 2022-12-20 DIAGNOSIS — Z96653 Presence of artificial knee joint, bilateral: Secondary | ICD-10-CM | POA: Diagnosis not present

## 2022-12-20 DIAGNOSIS — Z79899 Other long term (current) drug therapy: Secondary | ICD-10-CM | POA: Diagnosis not present

## 2022-12-22 ENCOUNTER — Other Ambulatory Visit: Payer: Self-pay | Admitting: Nurse Practitioner

## 2022-12-23 NOTE — Telephone Encounter (Signed)
Prescription refill request for Eliquis received. Indication:afib Last office visit:9/24 Scr:1.0  7/24 Age: 87 Weight:81.2  kg  Prescription refilled

## 2022-12-24 DIAGNOSIS — G4730 Sleep apnea, unspecified: Secondary | ICD-10-CM | POA: Diagnosis not present

## 2022-12-24 DIAGNOSIS — I10 Essential (primary) hypertension: Secondary | ICD-10-CM | POA: Diagnosis not present

## 2022-12-24 DIAGNOSIS — Z7901 Long term (current) use of anticoagulants: Secondary | ICD-10-CM | POA: Diagnosis not present

## 2022-12-24 DIAGNOSIS — E785 Hyperlipidemia, unspecified: Secondary | ICD-10-CM | POA: Diagnosis not present

## 2022-12-24 DIAGNOSIS — J4489 Other specified chronic obstructive pulmonary disease: Secondary | ICD-10-CM | POA: Diagnosis not present

## 2022-12-24 DIAGNOSIS — Z7984 Long term (current) use of oral hypoglycemic drugs: Secondary | ICD-10-CM | POA: Diagnosis not present

## 2022-12-24 DIAGNOSIS — Z9089 Acquired absence of other organs: Secondary | ICD-10-CM | POA: Diagnosis not present

## 2022-12-24 DIAGNOSIS — Z79899 Other long term (current) drug therapy: Secondary | ICD-10-CM | POA: Diagnosis not present

## 2022-12-24 DIAGNOSIS — K5790 Diverticulosis of intestine, part unspecified, without perforation or abscess without bleeding: Secondary | ICD-10-CM | POA: Diagnosis not present

## 2022-12-24 DIAGNOSIS — Z993 Dependence on wheelchair: Secondary | ICD-10-CM | POA: Diagnosis not present

## 2022-12-24 DIAGNOSIS — E119 Type 2 diabetes mellitus without complications: Secondary | ICD-10-CM | POA: Diagnosis not present

## 2022-12-24 DIAGNOSIS — Z96653 Presence of artificial knee joint, bilateral: Secondary | ICD-10-CM | POA: Diagnosis not present

## 2022-12-24 DIAGNOSIS — Z9981 Dependence on supplemental oxygen: Secondary | ICD-10-CM | POA: Diagnosis not present

## 2022-12-24 DIAGNOSIS — Z556 Problems related to health literacy: Secondary | ICD-10-CM | POA: Diagnosis not present

## 2022-12-24 DIAGNOSIS — K219 Gastro-esophageal reflux disease without esophagitis: Secondary | ICD-10-CM | POA: Diagnosis not present

## 2022-12-24 DIAGNOSIS — Z7951 Long term (current) use of inhaled steroids: Secondary | ICD-10-CM | POA: Diagnosis not present

## 2022-12-24 DIAGNOSIS — Z9884 Bariatric surgery status: Secondary | ICD-10-CM | POA: Diagnosis not present

## 2022-12-24 DIAGNOSIS — S42292D Other displaced fracture of upper end of left humerus, subsequent encounter for fracture with routine healing: Secondary | ICD-10-CM | POA: Diagnosis not present

## 2022-12-24 DIAGNOSIS — Z9049 Acquired absence of other specified parts of digestive tract: Secondary | ICD-10-CM | POA: Diagnosis not present

## 2022-12-24 DIAGNOSIS — Z9841 Cataract extraction status, right eye: Secondary | ICD-10-CM | POA: Diagnosis not present

## 2022-12-24 DIAGNOSIS — M1990 Unspecified osteoarthritis, unspecified site: Secondary | ICD-10-CM | POA: Diagnosis not present

## 2022-12-25 DIAGNOSIS — I10 Essential (primary) hypertension: Secondary | ICD-10-CM | POA: Diagnosis not present

## 2022-12-27 DIAGNOSIS — S42292D Other displaced fracture of upper end of left humerus, subsequent encounter for fracture with routine healing: Secondary | ICD-10-CM | POA: Diagnosis not present

## 2022-12-27 DIAGNOSIS — K219 Gastro-esophageal reflux disease without esophagitis: Secondary | ICD-10-CM | POA: Diagnosis not present

## 2022-12-27 DIAGNOSIS — Z7984 Long term (current) use of oral hypoglycemic drugs: Secondary | ICD-10-CM | POA: Diagnosis not present

## 2022-12-27 DIAGNOSIS — Z7901 Long term (current) use of anticoagulants: Secondary | ICD-10-CM | POA: Diagnosis not present

## 2022-12-27 DIAGNOSIS — Z9884 Bariatric surgery status: Secondary | ICD-10-CM | POA: Diagnosis not present

## 2022-12-27 DIAGNOSIS — M1990 Unspecified osteoarthritis, unspecified site: Secondary | ICD-10-CM | POA: Diagnosis not present

## 2022-12-27 DIAGNOSIS — Z993 Dependence on wheelchair: Secondary | ICD-10-CM | POA: Diagnosis not present

## 2022-12-27 DIAGNOSIS — Z9089 Acquired absence of other organs: Secondary | ICD-10-CM | POA: Diagnosis not present

## 2022-12-27 DIAGNOSIS — Z9049 Acquired absence of other specified parts of digestive tract: Secondary | ICD-10-CM | POA: Diagnosis not present

## 2022-12-27 DIAGNOSIS — I10 Essential (primary) hypertension: Secondary | ICD-10-CM | POA: Diagnosis not present

## 2022-12-27 DIAGNOSIS — K5790 Diverticulosis of intestine, part unspecified, without perforation or abscess without bleeding: Secondary | ICD-10-CM | POA: Diagnosis not present

## 2022-12-27 DIAGNOSIS — E119 Type 2 diabetes mellitus without complications: Secondary | ICD-10-CM | POA: Diagnosis not present

## 2022-12-27 DIAGNOSIS — Z9841 Cataract extraction status, right eye: Secondary | ICD-10-CM | POA: Diagnosis not present

## 2022-12-27 DIAGNOSIS — Z556 Problems related to health literacy: Secondary | ICD-10-CM | POA: Diagnosis not present

## 2022-12-27 DIAGNOSIS — Z79899 Other long term (current) drug therapy: Secondary | ICD-10-CM | POA: Diagnosis not present

## 2022-12-27 DIAGNOSIS — Z7951 Long term (current) use of inhaled steroids: Secondary | ICD-10-CM | POA: Diagnosis not present

## 2022-12-27 DIAGNOSIS — E785 Hyperlipidemia, unspecified: Secondary | ICD-10-CM | POA: Diagnosis not present

## 2022-12-27 DIAGNOSIS — Z96653 Presence of artificial knee joint, bilateral: Secondary | ICD-10-CM | POA: Diagnosis not present

## 2022-12-27 DIAGNOSIS — G4730 Sleep apnea, unspecified: Secondary | ICD-10-CM | POA: Diagnosis not present

## 2022-12-27 DIAGNOSIS — J4489 Other specified chronic obstructive pulmonary disease: Secondary | ICD-10-CM | POA: Diagnosis not present

## 2022-12-27 DIAGNOSIS — Z9981 Dependence on supplemental oxygen: Secondary | ICD-10-CM | POA: Diagnosis not present

## 2022-12-28 ENCOUNTER — Other Ambulatory Visit: Payer: Self-pay | Admitting: Nurse Practitioner

## 2022-12-30 ENCOUNTER — Telehealth: Payer: Self-pay

## 2022-12-30 DIAGNOSIS — E1169 Type 2 diabetes mellitus with other specified complication: Secondary | ICD-10-CM

## 2022-12-30 MED ORDER — ATORVASTATIN CALCIUM 20 MG PO TABS: 20.0000 mg | ORAL_TABLET | Freq: Every day | ORAL | 3 refills | Status: DC

## 2022-12-30 NOTE — Telephone Encounter (Signed)
Patient requesting a refill on atorvastatin. Last Lipid labs were Jan. 2023. Does she need new labs?

## 2022-12-31 DIAGNOSIS — J4489 Other specified chronic obstructive pulmonary disease: Secondary | ICD-10-CM | POA: Diagnosis not present

## 2022-12-31 DIAGNOSIS — Z9049 Acquired absence of other specified parts of digestive tract: Secondary | ICD-10-CM | POA: Diagnosis not present

## 2022-12-31 DIAGNOSIS — Z79899 Other long term (current) drug therapy: Secondary | ICD-10-CM | POA: Diagnosis not present

## 2022-12-31 DIAGNOSIS — Z556 Problems related to health literacy: Secondary | ICD-10-CM | POA: Diagnosis not present

## 2022-12-31 DIAGNOSIS — K219 Gastro-esophageal reflux disease without esophagitis: Secondary | ICD-10-CM | POA: Diagnosis not present

## 2022-12-31 DIAGNOSIS — E785 Hyperlipidemia, unspecified: Secondary | ICD-10-CM | POA: Diagnosis not present

## 2022-12-31 DIAGNOSIS — Z9089 Acquired absence of other organs: Secondary | ICD-10-CM | POA: Diagnosis not present

## 2022-12-31 DIAGNOSIS — Z7984 Long term (current) use of oral hypoglycemic drugs: Secondary | ICD-10-CM | POA: Diagnosis not present

## 2022-12-31 DIAGNOSIS — S42292D Other displaced fracture of upper end of left humerus, subsequent encounter for fracture with routine healing: Secondary | ICD-10-CM | POA: Diagnosis not present

## 2022-12-31 DIAGNOSIS — M1990 Unspecified osteoarthritis, unspecified site: Secondary | ICD-10-CM | POA: Diagnosis not present

## 2022-12-31 DIAGNOSIS — G4730 Sleep apnea, unspecified: Secondary | ICD-10-CM | POA: Diagnosis not present

## 2022-12-31 DIAGNOSIS — Z7951 Long term (current) use of inhaled steroids: Secondary | ICD-10-CM | POA: Diagnosis not present

## 2022-12-31 DIAGNOSIS — Z9981 Dependence on supplemental oxygen: Secondary | ICD-10-CM | POA: Diagnosis not present

## 2022-12-31 DIAGNOSIS — Z993 Dependence on wheelchair: Secondary | ICD-10-CM | POA: Diagnosis not present

## 2022-12-31 DIAGNOSIS — Z7901 Long term (current) use of anticoagulants: Secondary | ICD-10-CM | POA: Diagnosis not present

## 2022-12-31 DIAGNOSIS — I10 Essential (primary) hypertension: Secondary | ICD-10-CM | POA: Diagnosis not present

## 2022-12-31 DIAGNOSIS — Z9884 Bariatric surgery status: Secondary | ICD-10-CM | POA: Diagnosis not present

## 2022-12-31 DIAGNOSIS — K5790 Diverticulosis of intestine, part unspecified, without perforation or abscess without bleeding: Secondary | ICD-10-CM | POA: Diagnosis not present

## 2022-12-31 DIAGNOSIS — Z96653 Presence of artificial knee joint, bilateral: Secondary | ICD-10-CM | POA: Diagnosis not present

## 2022-12-31 DIAGNOSIS — E119 Type 2 diabetes mellitus without complications: Secondary | ICD-10-CM | POA: Diagnosis not present

## 2022-12-31 DIAGNOSIS — Z9841 Cataract extraction status, right eye: Secondary | ICD-10-CM | POA: Diagnosis not present

## 2023-01-06 DIAGNOSIS — Z96653 Presence of artificial knee joint, bilateral: Secondary | ICD-10-CM | POA: Diagnosis not present

## 2023-01-06 DIAGNOSIS — S42292D Other displaced fracture of upper end of left humerus, subsequent encounter for fracture with routine healing: Secondary | ICD-10-CM | POA: Diagnosis not present

## 2023-01-06 DIAGNOSIS — Z9841 Cataract extraction status, right eye: Secondary | ICD-10-CM | POA: Diagnosis not present

## 2023-01-06 DIAGNOSIS — G4730 Sleep apnea, unspecified: Secondary | ICD-10-CM | POA: Diagnosis not present

## 2023-01-06 DIAGNOSIS — Z7951 Long term (current) use of inhaled steroids: Secondary | ICD-10-CM | POA: Diagnosis not present

## 2023-01-06 DIAGNOSIS — J4489 Other specified chronic obstructive pulmonary disease: Secondary | ICD-10-CM | POA: Diagnosis not present

## 2023-01-06 DIAGNOSIS — Z9981 Dependence on supplemental oxygen: Secondary | ICD-10-CM | POA: Diagnosis not present

## 2023-01-06 DIAGNOSIS — I10 Essential (primary) hypertension: Secondary | ICD-10-CM | POA: Diagnosis not present

## 2023-01-06 DIAGNOSIS — Z556 Problems related to health literacy: Secondary | ICD-10-CM | POA: Diagnosis not present

## 2023-01-06 DIAGNOSIS — Z9089 Acquired absence of other organs: Secondary | ICD-10-CM | POA: Diagnosis not present

## 2023-01-06 DIAGNOSIS — Z9884 Bariatric surgery status: Secondary | ICD-10-CM | POA: Diagnosis not present

## 2023-01-06 DIAGNOSIS — Z993 Dependence on wheelchair: Secondary | ICD-10-CM | POA: Diagnosis not present

## 2023-01-06 DIAGNOSIS — Z9049 Acquired absence of other specified parts of digestive tract: Secondary | ICD-10-CM | POA: Diagnosis not present

## 2023-01-06 DIAGNOSIS — E785 Hyperlipidemia, unspecified: Secondary | ICD-10-CM | POA: Diagnosis not present

## 2023-01-06 DIAGNOSIS — K5790 Diverticulosis of intestine, part unspecified, without perforation or abscess without bleeding: Secondary | ICD-10-CM | POA: Diagnosis not present

## 2023-01-06 DIAGNOSIS — E119 Type 2 diabetes mellitus without complications: Secondary | ICD-10-CM | POA: Diagnosis not present

## 2023-01-06 DIAGNOSIS — Z79899 Other long term (current) drug therapy: Secondary | ICD-10-CM | POA: Diagnosis not present

## 2023-01-06 DIAGNOSIS — M1990 Unspecified osteoarthritis, unspecified site: Secondary | ICD-10-CM | POA: Diagnosis not present

## 2023-01-06 DIAGNOSIS — K219 Gastro-esophageal reflux disease without esophagitis: Secondary | ICD-10-CM | POA: Diagnosis not present

## 2023-01-06 DIAGNOSIS — Z7901 Long term (current) use of anticoagulants: Secondary | ICD-10-CM | POA: Diagnosis not present

## 2023-01-06 DIAGNOSIS — Z7984 Long term (current) use of oral hypoglycemic drugs: Secondary | ICD-10-CM | POA: Diagnosis not present

## 2023-01-07 ENCOUNTER — Other Ambulatory Visit: Payer: Self-pay

## 2023-01-07 DIAGNOSIS — Z96653 Presence of artificial knee joint, bilateral: Secondary | ICD-10-CM | POA: Diagnosis not present

## 2023-01-07 DIAGNOSIS — Z9981 Dependence on supplemental oxygen: Secondary | ICD-10-CM | POA: Diagnosis not present

## 2023-01-07 DIAGNOSIS — Z7984 Long term (current) use of oral hypoglycemic drugs: Secondary | ICD-10-CM | POA: Diagnosis not present

## 2023-01-07 DIAGNOSIS — Z9884 Bariatric surgery status: Secondary | ICD-10-CM | POA: Diagnosis not present

## 2023-01-07 DIAGNOSIS — G4730 Sleep apnea, unspecified: Secondary | ICD-10-CM | POA: Diagnosis not present

## 2023-01-07 DIAGNOSIS — Z556 Problems related to health literacy: Secondary | ICD-10-CM | POA: Diagnosis not present

## 2023-01-07 DIAGNOSIS — E119 Type 2 diabetes mellitus without complications: Secondary | ICD-10-CM | POA: Diagnosis not present

## 2023-01-07 DIAGNOSIS — J4489 Other specified chronic obstructive pulmonary disease: Secondary | ICD-10-CM | POA: Diagnosis not present

## 2023-01-07 DIAGNOSIS — K5790 Diverticulosis of intestine, part unspecified, without perforation or abscess without bleeding: Secondary | ICD-10-CM | POA: Diagnosis not present

## 2023-01-07 DIAGNOSIS — Z993 Dependence on wheelchair: Secondary | ICD-10-CM | POA: Diagnosis not present

## 2023-01-07 DIAGNOSIS — Z7901 Long term (current) use of anticoagulants: Secondary | ICD-10-CM | POA: Diagnosis not present

## 2023-01-07 DIAGNOSIS — I10 Essential (primary) hypertension: Secondary | ICD-10-CM | POA: Diagnosis not present

## 2023-01-07 DIAGNOSIS — M1990 Unspecified osteoarthritis, unspecified site: Secondary | ICD-10-CM | POA: Diagnosis not present

## 2023-01-07 DIAGNOSIS — Z79899 Other long term (current) drug therapy: Secondary | ICD-10-CM | POA: Diagnosis not present

## 2023-01-07 DIAGNOSIS — Z9089 Acquired absence of other organs: Secondary | ICD-10-CM | POA: Diagnosis not present

## 2023-01-07 DIAGNOSIS — Z9049 Acquired absence of other specified parts of digestive tract: Secondary | ICD-10-CM | POA: Diagnosis not present

## 2023-01-07 DIAGNOSIS — K219 Gastro-esophageal reflux disease without esophagitis: Secondary | ICD-10-CM | POA: Diagnosis not present

## 2023-01-07 DIAGNOSIS — Z9841 Cataract extraction status, right eye: Secondary | ICD-10-CM | POA: Diagnosis not present

## 2023-01-07 DIAGNOSIS — Z7951 Long term (current) use of inhaled steroids: Secondary | ICD-10-CM | POA: Diagnosis not present

## 2023-01-07 DIAGNOSIS — S42292D Other displaced fracture of upper end of left humerus, subsequent encounter for fracture with routine healing: Secondary | ICD-10-CM | POA: Diagnosis not present

## 2023-01-07 DIAGNOSIS — E785 Hyperlipidemia, unspecified: Secondary | ICD-10-CM | POA: Diagnosis not present

## 2023-01-07 NOTE — Patient Outreach (Signed)
Care Management   Visit Note  01/07/2023 Name: Jenna [name] MRN: [id number] DOB: [birth year]  Subjective: Jenna [name] is a 87 y.o. year old female who is a primary care patient of Smitty Cords, DO. The Care Management team was consulted for assistance.      Engaged with Jenna [name] and her daughter Jenna [name] today via telephone.  Assessment:  Review of patient past medical history, allergies, medications, health status, including review of consultants reports, laboratory and other test data, was performed as part of  evaluation and provision of care management services.    Outpatient Encounter Medications as of 01/07/2023  Medication Sig   albuterol (PROVENTIL) (2.5 MG/3ML) 0.083% nebulizer solution Take 3 mLs (2.5 mg total) by nebulization every 6 (six) hours as needed for wheezing or shortness of breath. (Patient not taking: Reported on 10/02/2022)   albuterol (VENTOLIN HFA) 108 (90 Base) MCG/ACT inhaler Inhale 2 puffs into the lungs every 6 (six) hours as needed for wheezing or shortness of breath. (Patient not taking: Reported on 10/02/2022)   alendronate (FOSAMAX) 70 MG tablet Take 1 tablet (70 mg total) by mouth once a week. Take with a full glass of water on an empty stomach.   Ascorbic Acid (VITAMIN C) 1000 MG tablet Take 500 mg by mouth daily.    atorvastatin (LIPITOR) 20 MG tablet Take 1 tablet (20 mg total) by mouth at bedtime.   busPIRone (BUSPAR) 5 MG tablet Take 1 tablet (5 mg total) by mouth 3 (three) times daily as needed (anxiety).   Cholecalciferol 25 MCG (1000 UT) tablet Take 1,000 Units by mouth daily.    diltiazem (CARDIZEM CD) 240 MG 24 hr capsule TAKE ONE CAPSULE BY MOUTH DAILY   ELIQUIS 5 MG TABS tablet TAKE ONE TABLET BY MOUTH TWICE DAILY   empagliflozin (JARDIANCE) 10 MG TABS tablet Take 1 tablet (10 mg total) by mouth daily before breakfast.   famotidine (PEPCID) 20 MG tablet Take 20 mg by mouth 2 (two) times daily.   ferrous sulfate 325 (65 FE) MG  EC tablet Take 325 mg by mouth at bedtime.    furosemide (LASIX) 20 MG tablet Take 1 tablet (20 mg total) by mouth daily as needed for fluid or edema. Take lasix daily, with extra dose as needed for swelling   gabapentin (NEURONTIN) 300 MG capsule Take 1 capsule (300 mg total) by mouth at bedtime. Please schedule an office visit before anymore refills.   ipratropium-albuterol (DUONEB) 0.5-2.5 (3) MG/3ML SOLN Take 3 mLs by nebulization every 4 (four) hours as needed.   methocarbamol (ROBAXIN) 500 MG tablet Take 1 tablet (500 mg total) by mouth every 8 (eight) hours as needed for muscle spasms.   metoprolol tartrate (LOPRESSOR) 25 MG tablet TAKE ONE TABLET BY MOUTH TWICE DAILY AS NEEDED   Multiple Vitamins-Minerals (ONE-A-DAY WOMENS PETITES) TABS Take 1 tablet by mouth every morning.   ondansetron (ZOFRAN-ODT) 4 MG disintegrating tablet Take 1 tablet (4 mg total) by mouth every 8 (eight) hours as needed for nausea or vomiting. (Patient taking differently: Take 4 mg by mouth every 6 (six) hours as needed for nausea or vomiting.)   oxyCODONE-acetaminophen (PERCOCET/ROXICET) 5-325 MG tablet Take 1 tablet by mouth every 6 (six) hours as needed for severe pain.   pantoprazole (PROTONIX) 40 MG tablet Take 1 tablet (40 mg total) by mouth daily.   potassium chloride SA (KLOR-CON M) 20 MEQ tablet Take 2 tablets (40 mEq total) by mouth 2 (two) times daily.  sertraline (ZOLOFT) 100 MG tablet Take 1 tablet (100 mg total) by mouth daily. Please schedule an office visit before anymore refills.   SUPER B COMPLEX/C PO Take 1 tablet by mouth daily.   traZODone (DESYREL) 100 MG tablet Take 1 tablet (100 mg total) by mouth at bedtime.   TRELEGY ELLIPTA 100-62.5-25 MCG/INH AEPB Inhale 1 puff into the lungs daily.   No facility-administered encounter medications on file as of 01/07/2023.

## 2023-01-08 ENCOUNTER — Encounter: Payer: Self-pay | Admitting: Pharmacist

## 2023-01-08 ENCOUNTER — Other Ambulatory Visit: Payer: Medicare Other | Admitting: Pharmacist

## 2023-01-08 NOTE — Progress Notes (Signed)
01/08/2023 Name: Jenna [name] MRN: [id number] DOB: Apr 22, 1935  Chief Complaint  Patient presents with   Medication Assistance    Jenna [name] is a 87 y.o. year old female who was referred to the pharmacist by their Case Management Team  for assistance in managing medication access.   Reach patient by telephone today. She asks that I speak with her daughter, [name], who manages her medications  Reach daughter/caregiver by telephone today as requested  Subjective:  Care Team: Primary Care Provider: Smitty Cords, DO  Neurologist: Jolene Provost, MD ; Next Scheduled Visit: 02/03/2023 Cardiologist: Antonieta Iba, MD  Heart Failure Specialist: Delma Freeze, FNP  Nurse Care Manager: Juanell Fairly, RN; Next Scheduled Visit: 02/14/2023  Medication Access/Adherence  Current Pharmacy:  [facility] - [place] - [address] [place] [address] Phone: [phone] Fax: [phone]  [facility] [address] [place], [place] - [address] [place] [address] Phone: [phone] Fax: [phone]   Patient reports affordability concerns with their medications: Yes  Patient reports access/transportation concerns to their pharmacy: No  Patient reports adherence concerns with their medications:  No    Reports that the cost of patient's Jardiance, Eliquis and Trelegy are difficult to afford, particularly when patient reaches the coverage gap of her Medicare prescription plan  Daughter reports patient's has a supplemental home healthcare plan that is supposed to cover some medicine cost.  Objective:  Lab Results  Component Value Date   HGBA1C 6.9 (A) 06/28/2022    Lab Results  Component Value Date   CREATININE 1.21 (H) 03/27/2022   BUN 24 (H) 03/27/2022   NA 137 03/27/2022   K 4.1 03/27/2022   CL 101 03/27/2022   CO2 28 03/27/2022     Current Outpatient  Medications on File Prior to Visit  Medication Sig Dispense Refill   albuterol (PROVENTIL) (2.5 MG/3ML) 0.083% nebulizer solution Take 3 mLs (2.5 mg total) by nebulization every 6 (six) hours as needed for wheezing or shortness of breath. (Patient not taking: Reported on 10/02/2022) 360 mL 0   albuterol (VENTOLIN HFA) 108 (90 Base) MCG/ACT inhaler Inhale 2 puffs into the lungs every 6 (six) hours as needed for wheezing or shortness of breath. (Patient not taking: Reported on 10/02/2022) 1 each 3   alendronate (FOSAMAX) 70 MG tablet Take 1 tablet (70 mg total) by mouth once a week. Take with a full glass of water on an empty stomach. 12 tablet 3   Ascorbic Acid (VITAMIN C) 1000 MG tablet Take 500 mg by mouth daily.      atorvastatin (LIPITOR) 20 MG tablet Take 1 tablet (20 mg total) by mouth at bedtime. 90 tablet 3   busPIRone (BUSPAR) 5 MG tablet Take 1 tablet (5 mg total) by mouth 3 (three) times daily as needed (anxiety). 90 tablet 1   Cholecalciferol 25 MCG (1000 UT) tablet Take 1,000 Units by mouth daily.      diltiazem (CARDIZEM CD) 240 MG 24 hr capsule TAKE ONE CAPSULE BY MOUTH DAILY 90 capsule 0   ELIQUIS 5 MG TABS tablet TAKE ONE TABLET BY MOUTH TWICE DAILY 60 tablet 6   empagliflozin (JARDIANCE) 10 MG TABS tablet Take 1 tablet (10 mg total) by mouth daily before breakfast. 90 tablet 1   famotidine (PEPCID) 20 MG tablet Take 20 mg by mouth 2 (two) times daily.  ferrous sulfate 325 (65 FE) MG EC tablet Take 325 mg by mouth at bedtime.      furosemide (LASIX) 20 MG tablet Take 1 tablet (20 mg total) by mouth daily as needed for fluid or edema. Take lasix daily, with extra dose as needed for swelling 90 tablet 3   gabapentin (NEURONTIN) 300 MG capsule Take 1 capsule (300 mg total) by mouth at bedtime. Please schedule an office visit before anymore refills. 30 capsule 0   ipratropium-albuterol (DUONEB) 0.5-2.5 (3) MG/3ML SOLN Take 3 mLs by nebulization every 4 (four) hours as needed. 720 mL 5    methocarbamol (ROBAXIN) 500 MG tablet Take 1 tablet (500 mg total) by mouth every 8 (eight) hours as needed for muscle spasms. 90 tablet 3   metoprolol tartrate (LOPRESSOR) 25 MG tablet TAKE ONE TABLET BY MOUTH TWICE DAILY AS NEEDED 180 tablet 0   Multiple Vitamins-Minerals (ONE-A-DAY WOMENS PETITES) TABS Take 1 tablet by mouth every morning.     ondansetron (ZOFRAN-ODT) 4 MG disintegrating tablet Take 1 tablet (4 mg total) by mouth every 8 (eight) hours as needed for nausea or vomiting. (Patient taking differently: Take 4 mg by mouth every 6 (six) hours as needed for nausea or vomiting.) 15 tablet 0   oxyCODONE-acetaminophen (PERCOCET/ROXICET) 5-325 MG tablet Take 1 tablet by mouth every 6 (six) hours as needed for severe pain.     pantoprazole (PROTONIX) 40 MG tablet Take 1 tablet (40 mg total) by mouth daily. 90 tablet 3   potassium chloride SA (KLOR-CON M) 20 MEQ tablet Take 2 tablets (40 mEq total) by mouth 2 (two) times daily. 180 tablet 1   sertraline (ZOLOFT) 100 MG tablet Take 1 tablet (100 mg total) by mouth daily. Please schedule an office visit before anymore refills. 90 tablet 3   SUPER B COMPLEX/C PO Take 1 tablet by mouth daily.     traZODone (DESYREL) 100 MG tablet Take 1 tablet (100 mg total) by mouth at bedtime. 90 tablet 3   TRELEGY ELLIPTA 100-62.5-25 MCG/INH AEPB Inhale 1 puff into the lungs daily.     No current facility-administered medications on file prior to visit.     Assessment/Plan:   Unable to review medications today as daughter is not currently home with patient  Based on reported income, patient does not meet criteria for patient assistance for Jardiance from [facility] (or therapeutic alternative Farxiga from [employer]), Eliquis from [facility] or Trelegy from [employer]  Based on reported income, patient would meet criteria for [employer] or [employer] Heart Failure grants, but neither of these grant funds are currently open - Encourage caregiver to assist patient with  signing up for [employer] waitlist and [employer] alerts. Send patient MyChart message with this information as requested  Daughter also plans to follow up with patient's supplemental home healthcare plan to see if plan will help to cover some of her medicine cost  Follow Up Plan: Clinical Pharmacist will outreach to patient/daughter by telephone on 02/03/2023 at 2:30 PM   Estelle Grumbles, PharmD, [facility] Clinical Pharmacist [facility], [place] [facility] [phone]

## 2023-01-08 NOTE — Patient Instructions (Signed)
Goals Addressed             This Visit's Progress    Pharmacy Goals       Please visit the [employer] website to complete enrollment for the Heart Failure Grant waitlist at:   [email or web address]   To apply by phone, you can call [phone] Monday through Friday, 9 a.m. to 5:30 p.m. ET.   Also, the following is the link for the [employer] Chronic Heart Failure Fund. This fund is currently closed, but there is an option through the link below to sign up for their "Fund Alerts" to be notified when it is open again.   [email or web address]     Thank you!   Estelle Grumbles, PharmD, [facility] Clinical Pharmacist [facility] [phone]

## 2023-01-13 DIAGNOSIS — Z993 Dependence on wheelchair: Secondary | ICD-10-CM | POA: Diagnosis not present

## 2023-01-13 DIAGNOSIS — J4489 Other specified chronic obstructive pulmonary disease: Secondary | ICD-10-CM | POA: Diagnosis not present

## 2023-01-13 DIAGNOSIS — Z9049 Acquired absence of other specified parts of digestive tract: Secondary | ICD-10-CM | POA: Diagnosis not present

## 2023-01-13 DIAGNOSIS — Z96653 Presence of artificial knee joint, bilateral: Secondary | ICD-10-CM | POA: Diagnosis not present

## 2023-01-13 DIAGNOSIS — Z9884 Bariatric surgery status: Secondary | ICD-10-CM | POA: Diagnosis not present

## 2023-01-13 DIAGNOSIS — Z556 Problems related to health literacy: Secondary | ICD-10-CM | POA: Diagnosis not present

## 2023-01-13 DIAGNOSIS — Z7984 Long term (current) use of oral hypoglycemic drugs: Secondary | ICD-10-CM | POA: Diagnosis not present

## 2023-01-13 DIAGNOSIS — E119 Type 2 diabetes mellitus without complications: Secondary | ICD-10-CM | POA: Diagnosis not present

## 2023-01-13 DIAGNOSIS — Z9841 Cataract extraction status, right eye: Secondary | ICD-10-CM | POA: Diagnosis not present

## 2023-01-13 DIAGNOSIS — Z7951 Long term (current) use of inhaled steroids: Secondary | ICD-10-CM | POA: Diagnosis not present

## 2023-01-13 DIAGNOSIS — E785 Hyperlipidemia, unspecified: Secondary | ICD-10-CM | POA: Diagnosis not present

## 2023-01-13 DIAGNOSIS — I10 Essential (primary) hypertension: Secondary | ICD-10-CM | POA: Diagnosis not present

## 2023-01-13 DIAGNOSIS — K5790 Diverticulosis of intestine, part unspecified, without perforation or abscess without bleeding: Secondary | ICD-10-CM | POA: Diagnosis not present

## 2023-01-13 DIAGNOSIS — K219 Gastro-esophageal reflux disease without esophagitis: Secondary | ICD-10-CM | POA: Diagnosis not present

## 2023-01-13 DIAGNOSIS — Z9981 Dependence on supplemental oxygen: Secondary | ICD-10-CM | POA: Diagnosis not present

## 2023-01-13 DIAGNOSIS — Z7901 Long term (current) use of anticoagulants: Secondary | ICD-10-CM | POA: Diagnosis not present

## 2023-01-13 DIAGNOSIS — M1990 Unspecified osteoarthritis, unspecified site: Secondary | ICD-10-CM | POA: Diagnosis not present

## 2023-01-13 DIAGNOSIS — S42292D Other displaced fracture of upper end of left humerus, subsequent encounter for fracture with routine healing: Secondary | ICD-10-CM | POA: Diagnosis not present

## 2023-01-13 DIAGNOSIS — G4730 Sleep apnea, unspecified: Secondary | ICD-10-CM | POA: Diagnosis not present

## 2023-01-13 DIAGNOSIS — Z79899 Other long term (current) drug therapy: Secondary | ICD-10-CM | POA: Diagnosis not present

## 2023-01-13 DIAGNOSIS — Z9089 Acquired absence of other organs: Secondary | ICD-10-CM | POA: Diagnosis not present

## 2023-01-25 DIAGNOSIS — I10 Essential (primary) hypertension: Secondary | ICD-10-CM | POA: Diagnosis not present

## 2023-02-03 ENCOUNTER — Other Ambulatory Visit: Payer: Medicare Other | Admitting: Pharmacist

## 2023-02-03 ENCOUNTER — Other Ambulatory Visit: Payer: Self-pay

## 2023-02-03 DIAGNOSIS — J432 Centrilobular emphysema: Secondary | ICD-10-CM

## 2023-02-03 DIAGNOSIS — F5101 Primary insomnia: Secondary | ICD-10-CM | POA: Diagnosis not present

## 2023-02-03 MED ORDER — POTASSIUM CHLORIDE CRYS ER 20 MEQ PO TBCR: 40.0000 meq | EXTENDED_RELEASE_TABLET | Freq: Two times a day (BID) | ORAL | 1 refills | Status: DC

## 2023-02-03 MED ORDER — ALBUTEROL SULFATE HFA 108 (90 BASE) MCG/ACT IN AERS: 2.0000 | INHALATION_SPRAY | Freq: Four times a day (QID) | RESPIRATORY_TRACT | 3 refills | Status: AC | PRN

## 2023-02-03 NOTE — Patient Instructions (Signed)
 Goals Addressed             This Visit's Progress    Pharmacy Goals       Please visit the [employer] website to complete enrollment for the Heart Failure Grant waitlist at:   [email or web address]   To apply by phone, you can call [phone] Monday through Friday, 9 a.m. to 5:30 p.m. ET.   Also, the following is the link for the [employer] Chronic Heart Failure Fund. This fund is currently closed, but there is an option through the link below to sign up for their "Fund Alerts" to be notified when it is open again.   [email or web address]     Thank you!   Estelle Grumbles, PharmD, [facility] Clinical Pharmacist [facility] [phone]

## 2023-02-03 NOTE — Progress Notes (Signed)
 02/03/2023 Name: Jenna [name] MRN: [id number] DOB: 12-10-1935  Chief Complaint  Patient presents with   Medication Assistance    Jenna [name] is a 88 y.o. year old female who was referred to the pharmacist by their Case Management Team  for assistance in managing medication access.    Reach patient and daughter/caregiver by telephone today.   Subjective:  Care Team: Primary Care Provider: Edman Marsa PARAS, DO  Neurologist: Maree Jannett Hering, MD ; Next Scheduled Visit: 02/03/2023 Cardiologist: Perla Evalene PARAS, MD  Heart Failure Specialist: Donette Ellouise LABOR, FNP  Nurse Care Manager: Karoline Lima, RN; Next Scheduled Visit: 02/14/2023  Medication Access/Adherence  Current Pharmacy:  [facility] [place] [address] [place] [address] Phone: [phone] Fax: [phone]  [facility] [address], [place] - [address] [place] [address] Phone: [phone] Fax: [phone]   Patient reports affordability concerns with their medications: Yes  Patient reports access/transportation concerns to their pharmacy: No  Patient reports adherence concerns with their medications:  No     Has reported the cost of patient's Jardiance , Eliquis  and Trelegy are difficult to afford, particularly when patient reaches the coverage gap of her Medicare prescription plan   Daughter plans to follow up with supplemental home healthcare plan tomorrow regarding covering some medicine cost.    Heart Failure/HTN/paroxysmal atrial fibrillation   Current medications:  SGLT2i: Jardiance  10 mg daily Beta blocker: metoprolol  25 mg twice daily as needed (if feels like has feeling like heart is racing) Denies having needed recently Diuretic regimen: furosemide  20 mg daily as needed for fluid or edema Diltiazem  CD 240 mg daily Anticoagulant: Eliquis  5 mg twice daily   Current home  blood pressure readings: last checked today at Neurology office visit, reading: 110/58, HR 70  Patient denies volume overload signs or symptoms including shortness of breath, lower extremity edema, increased use of pillows at night   Current medication access support: none   Objective:  Lab Results  Component Value Date   HGBA1C 6.9 (A) 06/28/2022    Lab Results  Component Value Date   CREATININE 1.21 (H) 03/27/2022   BUN 24 (H) 03/27/2022   NA 137 03/27/2022   K 4.1 03/27/2022   CL 101 03/27/2022   CO2 28 03/27/2022    Lab Results  Component Value Date   CHOL 167 02/05/2021   HDL 72 02/05/2021   LDLCALC 69 02/05/2021   TRIG 182 (H) 02/05/2021   CHOLHDL 2.3 02/05/2021   BP Readings from Last 3 Encounters:  10/02/22 (!) 148/68  09/10/22 (!) 120/50  08/23/22 (!) 133/58   Pulse Readings from Last 3 Encounters:  10/02/22 71  09/10/22 70  08/23/22 (!) 58    Medications Reviewed Today     Reviewed by Alana Sharyle LABOR, RPH-CPP (Pharmacist) on 02/03/23 at 1553  Med List Status: <None>   Medication Order Taking? Sig Documenting Provider Last Dose Status Informant  albuterol  (VENTOLIN  HFA) 108 (90 Base) MCG/ACT inhaler [id number] Yes Inhale 2 puffs into the lungs every 6 (six) hours as needed for wheezing or shortness of breath. Edman Marsa PARAS, DO Taking Active   alendronate  (FOSAMAX ) 70 MG tablet [id number] Yes Take 1 tablet (70 mg total) by mouth once a week. Take with a full glass of water  on an empty stomach. Edman Marsa PARAS, DO Taking Active  Med Note [place], Torion Hulgan A   Mon Feb 03, 2023  3:38 PM) On Mondays  Ascorbic Acid  (VITAMIN C ) 1000 MG tablet [id number] Yes Take 500 mg by mouth daily.  [provider] Taking Active Self, Child           Med Note DIEDRA, ADRIENNE E   Fri Sep 22, 2015  1:41 PM)    atorvastatin  (LIPITOR) 20 MG tablet [id number] Yes Take 1 tablet (20 mg total) by mouth at bedtime. Edman Marsa PARAS,  DO Taking Active   busPIRone  (BUSPAR ) 5 MG tablet [id number] No Take 1 tablet (5 mg total) by mouth 3 (three) times daily as needed (anxiety).  Patient not taking: Reported on 02/03/2023   Edman Marsa PARAS, DO Not Taking Active Child  Cholecalciferol  25 MCG (1000 UT) tablet [id number] Yes Take 1,000 Units by mouth daily.  [provider] Taking Active Self, Child           Med Note DIEDRA, ADRIENNE E   Fri Sep 22, 2015  1:41 PM)    diltiazem  (CARDIZEM  CD) 240 MG 24 hr capsule [id number] Yes TAKE ONE CAPSULE BY MOUTH DAILY Vivienne Lonni Ingle, NP Taking Active   ELIQUIS  5 MG TABS tablet [id number] Yes TAKE ONE TABLET BY MOUTH TWICE DAILY Vivienne Lonni Ingle, NP Taking Active   empagliflozin  (JARDIANCE ) 10 MG TABS tablet [id number] Yes Take 1 tablet (10 mg total) by mouth daily before breakfast. Edman Marsa PARAS, DO Taking Active   famotidine  (PEPCID ) 20 MG tablet [id number] Yes Take 20 mg by mouth 2 (two) times daily. [provider] Taking Active Self, Child  ferrous sulfate  325 (65 FE) MG EC tablet [id number] Yes Take 325 mg by mouth at bedtime.  [provider] Taking Active Self, Child           Med Note (ARNOLD, ADRIENNE E   Fri Sep 22, 2015  1:40 PM)    furosemide  (LASIX ) 20 MG tablet [id number] Yes Take 1 tablet (20 mg total) by mouth daily as needed for fluid or edema. Take lasix  daily, with extra dose as needed for swelling Edman Marsa PARAS, DO Taking Active   gabapentin  (NEURONTIN ) 300 MG capsule [id number] Yes Take 1 capsule (300 mg total) by mouth at bedtime. Please schedule an office visit before anymore refills. Antonette Angeline ORN, NP Taking Active   ipratropium-albuterol  (DUONEB) 0.5-2.5 (3) MG/3ML SOLN [id number]  Take 3 mLs by nebulization every 4 (four) hours as needed. Edman Marsa PARAS, DO  Active Child  methocarbamol  (ROBAXIN ) 500 MG tablet [id number]  Take 1 tablet (500 mg total) by mouth every 8 (eight) hours as needed for  muscle spasms. Edman Marsa PARAS, DO  Active Child  metoprolol  tartrate (LOPRESSOR ) 25 MG tablet [id number]  TAKE ONE TABLET BY MOUTH TWICE DAILY AS NEEDED Gollan, Timothy J, MD  Active   Multiple Vitamins-Minerals (ONE-A-DAY WOMENS PETITES) TABS [id number] Yes Take 1 tablet by mouth every morning. [provider] Taking Active Self, Child  pantoprazole  (PROTONIX ) 40 MG tablet [id number] Yes Take 1 tablet (40 mg total) by mouth daily. Edman Marsa PARAS, DO Taking Active   potassium chloride  SA (KLOR-CON  M) 20 MEQ tablet [id number] Yes Take 2 tablets (40 mEq total) by mouth 2 (two) times daily. Edman Marsa PARAS, DO Taking Active   sertraline  (ZOLOFT ) 100 MG tablet [id number] Yes Take 1 tablet (100 mg total) by mouth daily. Please schedule an office visit before anymore refills. Edman Marsa PARAS, DO Taking Active   traZODone  (DESYREL )  100 MG tablet [id number] Yes Take 1 tablet (100 mg total) by mouth at bedtime. Edman Marsa PARAS, DO Taking Active   TRELEGY ELLIPTA 100-62.5-25 MCG/INH AEPB [id number] Yes Inhale 1 puff into the lungs daily. [provider] Taking Active Self, Child              Assessment/Plan:   Comprehensive medication review performed; medication list updated in electronic medical record - Caution for risk of dizziness or sedation with gabapentin  or methocarbamol   Denies dizziness/sedation with gabapentin   Reports takes methocarbamol  rarely - Denies difficulty with swallowing her potassium tablets - Identify patient in need of renewal of her albuterol  inhaler  Will send request for renewal to PCP - Confirms separates administration of alendronate  from vitamins by at least 30 minutes after alendronate   Based on reported income, patient does not meet criteria for patient assistance for Jardiance  from [facility] (or therapeutic alternative Farxiga from [employer]), Eliquis  from [employer] or Trelegy from [employer]   Based on reported income,  patient would meet criteria for [employer] or [employer] Heart Failure grants, but neither of these grant funds are currently open - Again encourage caregiver to assist patient with signing up for [employer] waitlist and [employer] alerts. Send patient MyChart message with this information as requested  Heart Failure/HTN/paroxysmal atrial fibrillation: - Reviewed to weigh daily and when to contact cardiology with weight gain - Recommend to monitor home blood pressure, keep log of results and have this record to review at upcoming medical appointments. Patient to contact provider office sooner if needed for readings outside of established parameters or symptoms   Follow Up Plan:   Patient denies further medication questions or concerns today Provide patient with contact information for clinic pharmacist to contact if needed in future for medication questions/concerns   Sharyle Sia, PharmD, JAQUELINE, CPP Clinical Pharmacist [facility] [phone]

## 2023-02-14 ENCOUNTER — Telehealth: Payer: Self-pay

## 2023-02-14 ENCOUNTER — Other Ambulatory Visit: Payer: Self-pay

## 2023-02-14 NOTE — Patient Outreach (Signed)
  Care Management   Outreach Note  02/14/2023 Name: Jenna [name] MRN: [id number] DOB: 04-29-1935  An unsuccessful outreach attempt was made today for a scheduled Care Management visit.   Follow Up Plan:  A HIPAA compliant phone message was left for the patient providing contact information and requesting a return call.     Juanell Fairly [facility] Population Health RN Care Manager Direct Dial: [phone]  Fax: [phone] Website: [email or web address]

## 2023-02-19 ENCOUNTER — Telehealth: Payer: Self-pay | Admitting: Family Medicine

## 2023-02-19 NOTE — Telephone Encounter (Signed)
Called 02/19/2023 to sched AWV - NO VOICEMAIL  Verlee Rossetti; Care Guide Ambulatory Clinical Support [facility] Direct Dial: [phone]

## 2023-02-25 DIAGNOSIS — R296 Repeated falls: Secondary | ICD-10-CM | POA: Diagnosis not present

## 2023-02-25 DIAGNOSIS — I10 Essential (primary) hypertension: Secondary | ICD-10-CM | POA: Diagnosis not present

## 2023-02-25 DIAGNOSIS — R54 Age-related physical debility: Secondary | ICD-10-CM | POA: Diagnosis not present

## 2023-02-28 DIAGNOSIS — S42292D Other displaced fracture of upper end of left humerus, subsequent encounter for fracture with routine healing: Secondary | ICD-10-CM | POA: Diagnosis not present

## 2023-02-28 DIAGNOSIS — M25512 Pain in left shoulder: Secondary | ICD-10-CM | POA: Diagnosis not present

## 2023-02-28 DIAGNOSIS — G8929 Other chronic pain: Secondary | ICD-10-CM | POA: Diagnosis not present

## 2023-02-28 DIAGNOSIS — M19012 Primary osteoarthritis, left shoulder: Secondary | ICD-10-CM | POA: Diagnosis not present

## 2023-03-18 DIAGNOSIS — R296 Repeated falls: Secondary | ICD-10-CM | POA: Diagnosis not present

## 2023-03-18 DIAGNOSIS — R54 Age-related physical debility: Secondary | ICD-10-CM | POA: Diagnosis not present

## 2023-03-21 ENCOUNTER — Other Ambulatory Visit: Payer: Self-pay | Admitting: Nurse Practitioner

## 2023-03-21 DIAGNOSIS — R296 Repeated falls: Secondary | ICD-10-CM | POA: Diagnosis not present

## 2023-03-21 DIAGNOSIS — R54 Age-related physical debility: Secondary | ICD-10-CM | POA: Diagnosis not present

## 2023-03-21 MED ORDER — DILTIAZEM HCL ER COATED BEADS 240 MG PO CP24: 240.0000 mg | ORAL_CAPSULE | Freq: Every day | ORAL | 0 refills | Status: DC

## 2023-03-21 NOTE — Telephone Encounter (Signed)
 This is a Educational psychologist pt

## 2023-03-21 NOTE — Telephone Encounter (Signed)
 Please contact pt for future appointment. Pt due for 12 month f/u.

## 2023-03-24 ENCOUNTER — Telehealth: Payer: Self-pay | Admitting: Family Medicine

## 2023-03-24 DIAGNOSIS — R296 Repeated falls: Secondary | ICD-10-CM | POA: Diagnosis not present

## 2023-03-24 DIAGNOSIS — R54 Age-related physical debility: Secondary | ICD-10-CM | POA: Diagnosis not present

## 2023-03-24 NOTE — Telephone Encounter (Signed)
 Called 03/24/2023 to sched AWV - NO VOICEMAIL  Jenna [name]; Care Guide Ambulatory Clinical Support [facility] Direct Dial: [phone]

## 2023-03-24 NOTE — Progress Notes (Unsigned)
 Date:  03/24/2023   ID:  Jenna [name], DOB 1935-01-28, MRN [id number]  Patient Location:  [address] [place] [address]   Provider location:   [facility], [place] office  PCP:  Smitty Cords, DO  Cardiologist:  [facility]   No chief complaint on file.    History of Present Illness:    Jenna [name] is a 88 y.o. female past medical history of obesity,  Hypertension, hyperlipidemia,  GERD,  chronic cough,  gastric bypass surgery,  depression,   chronic leg edema  Type 2 diabetes hemoglobin A1c 6.2 COPD, hospitalizations for COPD exacerbation, followed by Dr. Meredeth Ide Former smoker Stage III chronic kidney disease Stable chronic problem with mood, anxiety, secondary insomnia Periodic hospitalization for COPD exacerbation April 2022, February 23 presents for evaluation of pericardial effusion seen on CT scan,  Palpitations  Last seen in clinic by myself 2/24   [facility] 11/23, records reviewed COPD exacerbation. on steroid.  IV lasix, ABX, Bronchitis  Zio monitor for tachycardia at home was ordered Normal sinus rhythm with paroxysmal atrial fibrillation 1% burden, 12% burden on December 8 Not patient triggered  In follow-up today has appreciated rare episodes of tachycardia lasting several minutes at a time Most of the time does not appreciate significant tachypalpitations Feels she is tolerating diltiazem ER 240 mg daily and Eliquis 5 twice daily  Sedentary, no regular walking program, has chronic leg weakness  EKG personally reviewed by myself on todays visit Normal sinus rhythm with rate 91 bpm left axis deviation  Other past medical history reviewed hospital February 2023 for COPD Acute bronchitis with acute on chronic COPD exacerbation Treated with steroids, nebulizers Received doxycycline 7 days  Stress at home, son with CAD, MI Daughter living at their house,   hospital for  treatment of COPD exacerbation Normal echo 04/24/2020  Metoprolol reduced to 12.5 at discharge Steroids, ABX,   Prior monitor reviewed May 2020, showed normal sinus rhythm with 99 SVT runs lasting up to 4 minutes 30 secs, max rate of 210 bpm, and average rate 172 bpm.  Prior history of chronic cough. Her other big complaint didn't GERD symptoms. Several other family members have GERD as well. She has had chronic lower extremity edema, prior trauma to her left lower extremity. She takes Lasix 20 mg daily with no significant improvement     Past Medical History:  Diagnosis Date   Anxiety    Arthritis    neck, knees(before replacements)   Asthma    Chronic heart failure with preserved ejection fraction (HFpEF) (HCC)    a. 04/2021 Echo: EF 60-65% no rwma, nl RV fxn, RVSP 26.55mmHg, mild MR.   CKD (chronic kidney disease), stage III (HCC)    COPD (chronic obstructive pulmonary disease) (HCC)    Diabetes mellitus without complication (HCC)    Encounter for colonoscopy due to history of adenomatous colonic polyps    GERD (gastroesophageal reflux disease)    Hearing loss of both ears    Hyperlipidemia    Osteoporosis    PAF (paroxysmal atrial fibrillation) (HCC)    a. 12/2021 Zio: Predominantly sinus @ 72. 1% afib burden @ 85-176 bpm. Longest 2h 52m @ 120. Frequent PACs (6.2%); b. CHA2DS2VASc = 6.   Palpitations    PSVT (paroxysmal supraventricular tachycardia) (HCC)    a. 05/2018 Zio: 99 SVT runs. Fastest 218 x 4:30. Longest 4:38 w/ rate of 172.   Sleep apnea  resolved with gastric bypass   Syncope and collapse    Urine incontinence    Past Surgical History:  Procedure Laterality Date   BROW LIFT Bilateral 06/27/2015   Procedure: BLEPHAROPLASTY BILATERAL UPPER EYELIDS BILATERAL BLEPHAROTOSIS EYELIDS;  Surgeon: Imagene Riches, MD;  Location: [facility];  Service: Ophthalmology;  Laterality: Bilateral;  BILATERAL   CATARACT EXTRACTION W/PHACO Right 02/13/2015   Procedure:  CATARACT EXTRACTION PHACO AND INTRAOCULAR LENS PLACEMENT (IOC);  Surgeon: Sherald Hess, MD;  Location: [facility];  Service: Ophthalmology;  Laterality: Right;   CATARACT EXTRACTION W/PHACO Left 03/22/2015   Procedure: CATARACT EXTRACTION PHACO AND INTRAOCULAR LENS PLACEMENT (IOC);  Surgeon: Sherald Hess, MD;  Location: [facility];  Service: Ophthalmology;  Laterality: Left;  TORIC   CHOLECYSTECTOMY  1984   COLECTOMY     COSMETIC SURGERY  2012   tummy tuck and excess skin removal   GASTRIC BYPASS  2010   REPLACEMENT TOTAL KNEE BILATERAL  1998   SHOULDER SURGERY  2009   left    TONSILLECTOMY     TOTAL ABDOMINAL HYSTERECTOMY  1979     Current Outpatient Medications on File Prior to Visit  Medication Sig Dispense Refill   albuterol (VENTOLIN HFA) 108 (90 Base) MCG/ACT inhaler Inhale 2 puffs into the lungs every 6 (six) hours as needed for wheezing or shortness of breath. 1 each 3   alendronate (FOSAMAX) 70 MG tablet Take 1 tablet (70 mg total) by mouth once a week. Take with a full glass of water on an empty stomach. 12 tablet 3   Ascorbic Acid (VITAMIN C) 1000 MG tablet Take 500 mg by mouth daily.      atorvastatin (LIPITOR) 20 MG tablet Take 1 tablet (20 mg total) by mouth at bedtime. 90 tablet 3   busPIRone (BUSPAR) 5 MG tablet Take 1 tablet (5 mg total) by mouth 3 (three) times daily as needed (anxiety). (Patient not taking: Reported on 02/03/2023) 90 tablet 1   Cholecalciferol 25 MCG (1000 UT) tablet Take 1,000 Units by mouth daily.      diltiazem (CARDIZEM CD) 240 MG 24 hr capsule Take 1 capsule (240 mg total) by mouth daily. 90 capsule 0   ELIQUIS 5 MG TABS tablet TAKE ONE TABLET BY MOUTH TWICE DAILY 60 tablet 6   empagliflozin (JARDIANCE) 10 MG TABS tablet Take 1 tablet (10 mg total) by mouth daily before breakfast. 90 tablet 1   famotidine (PEPCID) 20 MG tablet Take 20 mg by mouth 2 (two) times daily.     ferrous sulfate 325 (65 FE) MG EC tablet  Take 325 mg by mouth at bedtime.      furosemide (LASIX) 20 MG tablet Take 1 tablet (20 mg total) by mouth daily as needed for fluid or edema. Take lasix daily, with extra dose as needed for swelling 90 tablet 3   gabapentin (NEURONTIN) 300 MG capsule Take 1 capsule (300 mg total) by mouth at bedtime. Please schedule an office visit before anymore refills. 30 capsule 0   ipratropium-albuterol (DUONEB) 0.5-2.5 (3) MG/3ML SOLN Take 3 mLs by nebulization every 4 (four) hours as needed. 720 mL 5   methocarbamol (ROBAXIN) 500 MG tablet Take 1 tablet (500 mg total) by mouth every 8 (eight) hours as needed for muscle spasms. 90 tablet 3   metoprolol tartrate (LOPRESSOR) 25 MG tablet TAKE ONE TABLET BY MOUTH TWICE DAILY AS NEEDED 180 tablet 0   Multiple Vitamins-Minerals (ONE-A-DAY WOMENS PETITES) TABS Take 1  tablet by mouth every morning.     pantoprazole (PROTONIX) 40 MG tablet Take 1 tablet (40 mg total) by mouth daily. 90 tablet 3   potassium chloride SA (KLOR-CON M) 20 MEQ tablet Take 2 tablets (40 mEq total) by mouth 2 (two) times daily. 180 tablet 1   sertraline (ZOLOFT) 100 MG tablet Take 1 tablet (100 mg total) by mouth daily. Please schedule an office visit before anymore refills. 90 tablet 3   traZODone (DESYREL) 100 MG tablet Take 1 tablet (100 mg total) by mouth at bedtime. 90 tablet 3   TRELEGY ELLIPTA 100-62.5-25 MCG/INH AEPB Inhale 1 puff into the lungs daily.     No current facility-administered medications on file prior to visit.    Allergies:   Ambien [zolpidem], Azithromycin, Hydromorphone, Morphine, Nsaids, Oxycodone-acetaminophen, Codeine, Nabumetone, and Promethazine hcl   Social History   Tobacco Use   Smoking status: Former    Current packs/day: 0.00    Average packs/day: 1 pack/day for 20.0 years (20.0 ttl pk-yrs)    Types: Cigarettes    Start date: 07/1961    Quit date: 07/1981    Years since quitting: 41.7   Smokeless tobacco: Former  Building services engineer status:  Never Used  Substance Use Topics   Alcohol use: Not Currently    Comment: past   Drug use: No      Family Hx: The patient's family history includes Colon cancer in her sister; Diabetes type II in her father and mother; Heart attack in her brother and mother; Heart attack (age of onset: 41) in her sister; Hyperlipidemia in her sister; Hypertension in her mother and sister; Ovarian cancer in her sister; Pneumonia in her father; Skin cancer in her father.  ROS:   Please see the history of present illness.    Review of Systems  Constitutional: Negative.   HENT: Negative.    Respiratory: Negative.    Cardiovascular:  Positive for palpitations.  Gastrointestinal: Negative.   Musculoskeletal: Negative.   Neurological: Negative.   Psychiatric/Behavioral: Negative.    All other systems reviewed and are negative.    Labs/Other Tests and Data Reviewed:    Recent Labs: 03/27/2022: BUN 24; Creatinine, Ser 1.21; Potassium 4.1; Sodium 137   Recent Lipid Panel Lab Results  Component Value Date/Time   CHOL 167 02/05/2021 08:11 AM   TRIG 182 (H) 02/05/2021 08:11 AM   HDL 72 02/05/2021 08:11 AM   CHOLHDL 2.3 02/05/2021 08:11 AM   LDLCALC 69 02/05/2021 08:11 AM    Wt Readings from Last 3 Encounters:  10/02/22 179 lb (81.2 kg)  09/10/22 179 lb (81.2 kg)  08/23/22 179 lb (81.2 kg)     Exam:    There were no vitals taken for this visit. Constitutional:  oriented to person, place, and time. No distress.  HENT:  Head: Grossly normal Eyes:  no discharge. No scleral icterus.  Neck: No JVD, no carotid bruits  Cardiovascular: Regular rate and rhythm, no murmurs appreciated Pulmonary/Chest: Clear to auscultation bilaterally, no wheezes or rails Abdominal: Soft.  no distension.  no tenderness.  Musculoskeletal: Normal range of motion Neurological:  normal muscle tone. Coordination normal. No atrophy Skin: Skin warm and dry Psychiatric: normal affect, pleasant  ASSESSMENT & PLAN:     paroxysmal atrial fibrillation Noted on Zio monitor, tolerating diltiazem ER to 40 daily  off metoprolol presumably for underlying COPD Given low blood pressure, recommend she take metoprolol tartrate as needed for prolonged episodes of atrial fibrillation.  Typically episodes only last several minutes before resolving  Centrilobular emphysema (HCC) Recent hospitalization for COPD exacerbation November 2023 requiring antibiotics, nebulizers, steroids, followed by Dr. Meredeth Ide On inhalers at home Feels she is back to her baseline  Controlled type 2 diabetes mellitus with diabetic nephropathy, without long-term current use of insulin (HCC) We have encouraged continued exercise, careful diet management   Leg swelling Consistent with lymphedema,Ace wraps,  Leg elevation recommended Symptoms stable, no pitting edema  Hyperlipidemia associated with type 2 diabetes mellitus (HCC) Cholesterol is at goal on the current lipid regimen. No changes to the medications were made.  Essential hypertension Blood pressure is well controlled on today's visit. No changes made to the medications.  Smoker Stopped smoking many years ago Continues to have COPD exacerbation hospitalizations   Total encounter time more than 40 minutes  Greater than 50% was spent in counseling and coordination of care with the patient     Signed, Julien Nordmann, MD  03/24/2023 6:39 PM    [facility] [address], [place], [place] [address]

## 2023-03-25 ENCOUNTER — Encounter: Payer: Self-pay | Admitting: Cardiovascular Disease

## 2023-03-25 ENCOUNTER — Ambulatory Visit: Payer: Medicare Other | Attending: Cardiovascular Disease | Admitting: Cardiovascular Disease

## 2023-03-25 VITALS — BP 110/60 | HR 74 | Ht 61.0 in | Wt 168.2 lb

## 2023-03-25 DIAGNOSIS — I471 Supraventricular tachycardia, unspecified: Secondary | ICD-10-CM

## 2023-03-25 DIAGNOSIS — N1832 Chronic kidney disease, stage 3b: Secondary | ICD-10-CM

## 2023-03-25 DIAGNOSIS — J449 Chronic obstructive pulmonary disease, unspecified: Secondary | ICD-10-CM

## 2023-03-25 DIAGNOSIS — I5032 Chronic diastolic (congestive) heart failure: Secondary | ICD-10-CM

## 2023-03-25 DIAGNOSIS — J441 Chronic obstructive pulmonary disease with (acute) exacerbation: Secondary | ICD-10-CM

## 2023-03-25 DIAGNOSIS — E1122 Type 2 diabetes mellitus with diabetic chronic kidney disease: Secondary | ICD-10-CM

## 2023-03-25 DIAGNOSIS — E782 Mixed hyperlipidemia: Secondary | ICD-10-CM | POA: Diagnosis not present

## 2023-03-25 DIAGNOSIS — I480 Paroxysmal atrial fibrillation: Secondary | ICD-10-CM | POA: Diagnosis not present

## 2023-03-25 DIAGNOSIS — I10 Essential (primary) hypertension: Secondary | ICD-10-CM | POA: Diagnosis not present

## 2023-03-25 DIAGNOSIS — N183 Chronic kidney disease, stage 3 unspecified: Secondary | ICD-10-CM | POA: Diagnosis not present

## 2023-03-25 MED ORDER — DILTIAZEM HCL ER COATED BEADS 240 MG PO CP24: 240.0000 mg | ORAL_CAPSULE | Freq: Every day | ORAL | 3 refills | Status: AC

## 2023-03-25 NOTE — Patient Instructions (Signed)

## 2023-03-27 DIAGNOSIS — I10 Essential (primary) hypertension: Secondary | ICD-10-CM | POA: Diagnosis not present

## 2023-03-28 DIAGNOSIS — R54 Age-related physical debility: Secondary | ICD-10-CM | POA: Diagnosis not present

## 2023-03-28 DIAGNOSIS — R296 Repeated falls: Secondary | ICD-10-CM | POA: Diagnosis not present

## 2023-04-03 ENCOUNTER — Telehealth: Payer: Self-pay

## 2023-04-03 DIAGNOSIS — F5104 Psychophysiologic insomnia: Secondary | ICD-10-CM

## 2023-04-03 DIAGNOSIS — E1121 Type 2 diabetes mellitus with diabetic nephropathy: Secondary | ICD-10-CM

## 2023-04-03 MED ORDER — TRAZODONE HCL 100 MG PO TABS
100.0000 mg | ORAL_TABLET | Freq: Every day | ORAL | 1 refills | Status: DC
Start: 2023-04-03 — End: 2023-09-24

## 2023-04-03 MED ORDER — EMPAGLIFLOZIN 10 MG PO TABS
10.0000 mg | ORAL_TABLET | Freq: Every day | ORAL | 1 refills | Status: DC
Start: 2023-04-03 — End: 2023-09-24

## 2023-04-03 NOTE — Telephone Encounter (Signed)
 Fax from [facility] Refill Trazadone and [employer]

## 2023-04-03 NOTE — Telephone Encounter (Signed)
 Refills sent

## 2023-04-03 NOTE — Addendum Note (Signed)
 Addended by: Smitty Cords on: 04/03/2023 10:47 AM   Modules accepted: Orders

## 2023-04-09 DIAGNOSIS — R54 Age-related physical debility: Secondary | ICD-10-CM | POA: Diagnosis not present

## 2023-04-09 DIAGNOSIS — R296 Repeated falls: Secondary | ICD-10-CM | POA: Diagnosis not present

## 2023-04-14 DIAGNOSIS — R54 Age-related physical debility: Secondary | ICD-10-CM | POA: Diagnosis not present

## 2023-04-14 DIAGNOSIS — R296 Repeated falls: Secondary | ICD-10-CM | POA: Diagnosis not present

## 2023-04-16 DIAGNOSIS — R54 Age-related physical debility: Secondary | ICD-10-CM | POA: Diagnosis not present

## 2023-04-16 DIAGNOSIS — R296 Repeated falls: Secondary | ICD-10-CM | POA: Diagnosis not present

## 2023-04-27 DIAGNOSIS — I10 Essential (primary) hypertension: Secondary | ICD-10-CM | POA: Diagnosis not present

## 2023-05-27 DIAGNOSIS — I10 Essential (primary) hypertension: Secondary | ICD-10-CM | POA: Diagnosis not present

## 2023-06-10 ENCOUNTER — Telehealth: Payer: Self-pay | Admitting: Family

## 2023-06-10 NOTE — Progress Notes (Deleted)
 Advanced Heart Failure Clinic Note    PCP: Jestine Moron, MD (video visit 08/24) Primary Cardiologist: Belva Boyden, MD (last seen 03/25)  Chief Complaint:   HPI:  Ms Jenna [name] is a 88 y/o female with a history of asthma, DM, hyperlipidemia, CKD, anxiety, obesity (gastric bypass surgery), HTN, GERD, COPD, PSVT, depression, sleep apnea, tobacco use and chronic heart failure. 12/25/21 she worse a Zio monitor for tachycardia at home which showed normal sinus rhythm with paroxysmal atrial fibrillation 1% burden, 12% burden on December 8.  Echo 11/12/13: EF 60-65% with mild LAE Echo 04/24/20: EF 60-65% with mild LAE and small pericardial effusion Echo 05/18/21: EF of 60-65% along with mild MR and normal PA Pressure of 26.3 mmHg.   Was in the ED 11/02/21 due to COPD exacerbation where she was treated and released.   Admitted 12/02/21 due to COPD exacerbation. Given steroids. CXR negative for pneumonia. Needed oxygen  and unable to be weaned off. 1 dose of IV lasix  given due to worsening edema. PT evaluation done. Discharged after 5 days with steroids and antibiotics.   Admitted 07/27/22 for pain management in the setting of right rib fracture and found to have elevated transaminitis. Pain was managed with oxycodone  as needed and she was encouraged to use incentive spirometer. During her stay a rapid response was called for decreased responsiveness which improved with Narcan. Given rapidly increasing enzymes the patient was transferred to [facility] for further evaluation by hepatology. Elevated liver enzymes on admission 7/6 to AST 491, ALT 328 and this increased to 1283 and >750 on 7/7. CT abdomen with contrast showed no acute abnormality within the abdomen or pelvis. CT imaging without evidence of liver pathology. Hepatitis labs negative. Her LFTs downtrended with conservative management. No clear etiology for the elevation was identified.   Was in the ED 09/10/22 due to a mechanical fall as she  walked into the kitchen of her home.  Patient endorses left shoulder pain. Found to have left humeral neck fracture. Shoulder placed in immobilizer and discharged with ortho f/u.   She presents today for a HF follow-up visit with a chief complaint of   ROS: All systems negative except as listed in HPI, PMH and Problem List.  SH:  Social History   Socioeconomic History   Marital status: Widowed    Spouse name: Jenna [name]   Number of children: 7   Years of education: Restaurant manager, fast food    Highest education level: High school graduate  Occupational History   Not on file  Tobacco Use   Smoking status: Former    Current packs/day: 0.00    Average packs/day: 1 pack/day for 20.0 years (20.0 ttl pk-yrs)    Types: Cigarettes    Start date: 07/1961    Quit date: 07/1981    Years since quitting: 41.9   Smokeless tobacco: Former  Building services engineer status: Never Used  Substance and Sexual Activity   Alcohol use: Not Currently    Comment: past   Drug use: No   Sexual activity: Not Currently  Other Topics Concern   Not on file  Social History Narrative   Not on file   Social Drivers of Health   Financial Resource Strain: Low Risk  (01/07/2023)   Overall Financial Resource Strain (CARDIA)    Difficulty of Paying Living Expenses: Not very hard  Food Insecurity: No Food Insecurity (01/07/2023)   Hunger Vital Sign    Worried About Running Out of Food in the Last  Year: Never true    Ran Out of Food in the Last Year: Never true  Transportation Needs: No Transportation Needs (01/07/2023)   PRAPARE - Administrator, Civil Service (Medical): No    Lack of Transportation (Non-Medical): No  Physical Activity: Inactive (07/27/2022)   Received from [facility], [facility]   Exercise Vital Sign    Days of Exercise per Week: 0 days    Minutes of Exercise per Session: 0 min  Stress: Stress Concern Present (01/07/2023)   [employer] of Occupational  Health - Occupational Stress Questionnaire    Feeling of Stress : To some extent  Social Connections: Moderately Integrated (07/27/2022)   Received from [facility], [facility]   Social Connection and Isolation Panel [NHANES]    Frequency of Communication with Friends and Family: More than three times a week    Frequency of Social Gatherings with Friends and Family: Once a week    Attends Religious Services: More than 4 times per year    Active Member of [employer] or Organizations: No    Attends Banker Meetings: Never    Marital Status: Married  Catering manager Violence: Not At Risk (01/07/2023)   Humiliation, Afraid, Rape, and Kick questionnaire    Fear of Current or Ex-Partner: No    Emotionally Abused: No    Physically Abused: No    Sexually Abused: No    FH:  Family History  Problem Relation Age of Onset   Heart attack Mother    Hypertension Mother    Diabetes type II Mother    Pneumonia Father    Skin cancer Father    Diabetes type II Father    Colon cancer Sister    Ovarian cancer Sister    Heart attack Brother    Heart attack Sister 80   Hyperlipidemia Sister    Hypertension Sister     Past Medical History:  Diagnosis Date   Anxiety    Arthritis    neck, knees(before replacements)   Asthma    Chronic heart failure with preserved ejection fraction (HFpEF) (HCC)    a. 04/2021 Echo: EF 60-65% no rwma, nl RV fxn, RVSP 26.71mmHg, mild MR.   CKD (chronic kidney disease), stage III (HCC)    COPD (chronic obstructive pulmonary disease) (HCC)    Diabetes mellitus without complication (HCC)    Encounter for colonoscopy due to history of adenomatous colonic polyps    GERD (gastroesophageal reflux disease)    Hearing loss of both ears    Hyperlipidemia    Osteoporosis    PAF (paroxysmal atrial fibrillation) (HCC)    a. 12/2021 Zio: Predominantly sinus @ 72. 1% afib burden @ 85-176 bpm. Longest 2h 43m @ 120. Frequent PACs (6.2%); b. CHA2DS2VASc = 6.    Palpitations    PSVT (paroxysmal supraventricular tachycardia) (HCC)    a. 05/2018 Zio: 99 SVT runs. Fastest 218 x 4:30. Longest 4:38 w/ rate of 172.   Sleep apnea    resolved with gastric bypass   Syncope and collapse    Urine incontinence     Current Outpatient Medications  Medication Sig Dispense Refill   albuterol  (VENTOLIN  HFA) 108 (90 Base) MCG/ACT inhaler Inhale 2 puffs into the lungs every 6 (six) hours as needed for wheezing or shortness of breath. 1 each 3   alendronate  (FOSAMAX ) 70 MG tablet Take 1 tablet (70 mg total) by mouth once a week. Take with a full glass of  water  on an empty stomach. 12 tablet 3   Ascorbic Acid  (VITAMIN C ) 1000 MG tablet Take 500 mg by mouth daily.      atorvastatin  (LIPITOR) 20 MG tablet Take 1 tablet (20 mg total) by mouth at bedtime. 90 tablet 3   busPIRone  (BUSPAR ) 5 MG tablet Take 1 tablet (5 mg total) by mouth 3 (three) times daily as needed (anxiety). 90 tablet 1   Cholecalciferol  25 MCG (1000 UT) tablet Take 1,000 Units by mouth daily.      diltiazem  (CARDIZEM  CD) 240 MG 24 hr capsule Take 1 capsule (240 mg total) by mouth daily. 90 capsule 3   donepezil (ARICEPT) 5 MG tablet Take 5 mg by mouth daily.     ELIQUIS  5 MG TABS tablet TAKE ONE TABLET BY MOUTH TWICE DAILY 60 tablet 6   empagliflozin  (JARDIANCE ) 10 MG TABS tablet Take 1 tablet (10 mg total) by mouth daily before breakfast. 90 tablet 1   famotidine  (PEPCID ) 20 MG tablet Take 20 mg by mouth 2 (two) times daily.     ferrous sulfate  325 (65 FE) MG EC tablet Take 325 mg by mouth at bedtime.      furosemide  (LASIX ) 20 MG tablet Take 1 tablet (20 mg total) by mouth daily as needed for fluid or edema. Take lasix  daily, with extra dose as needed for swelling 90 tablet 3   gabapentin  (NEURONTIN ) 300 MG capsule Take 1 capsule (300 mg total) by mouth at bedtime. Please schedule an office visit before anymore refills. 30 capsule 0   ipratropium-albuterol  (DUONEB) 0.5-2.5 (3) MG/3ML SOLN Take 3 mLs by  nebulization every 4 (four) hours as needed. 720 mL 5   methocarbamol  (ROBAXIN ) 500 MG tablet Take 1 tablet (500 mg total) by mouth every 8 (eight) hours as needed for muscle spasms. 90 tablet 3   metoprolol  tartrate (LOPRESSOR ) 25 MG tablet TAKE ONE TABLET BY MOUTH TWICE DAILY AS NEEDED 180 tablet 0   Multiple Vitamins-Minerals (ONE-A-DAY WOMENS PETITES) TABS Take 1 tablet by mouth every morning.     pantoprazole  (PROTONIX ) 40 MG tablet Take 1 tablet (40 mg total) by mouth daily. 90 tablet 3   potassium chloride  SA (KLOR-CON  M) 20 MEQ tablet Take 2 tablets (40 mEq total) by mouth 2 (two) times daily. 180 tablet 1   sertraline  (ZOLOFT ) 100 MG tablet Take 1 tablet (100 mg total) by mouth daily. Please schedule an office visit before anymore refills. 90 tablet 3   traZODone  (DESYREL ) 100 MG tablet Take 1 tablet (100 mg total) by mouth at bedtime. 90 tablet 1   TRELEGY ELLIPTA 100-62.5-25 MCG/INH AEPB Inhale 1 puff into the lungs daily.     No current facility-administered medications for this visit.     PHYSICAL EXAM:  General:  Well appearing. No resp difficulty HEENT: normal Neck: supple. JVP flat. No lymphadenopathy or thryomegaly appreciated. Cor: PMI normal. Regular rate & rhythm. No rubs, gallops or murmurs. Lungs: clear Abdomen: soft, nontender, nondistended. No hepatosplenomegaly. No bruits or masses.  Extremities: no cyanosis, clubbing, rash, 1+ pitting edema Neuro: alert & orientedx3, cranial nerves grossly intact. Left arm in sling. Affect pleasant.   ECG: not done   ASSESSMENT & PLAN:  1: NICM with preserved ejection fraction- - suspect due to paroxsymal SVT and / or COPD - NYHA class II - euvolemic today - not weighing daily due to safety concerns with standing on scale - unable to safely stand for office weight today - Echo 11/12/13: EF 60-65%  with mild LAE - Echo 04/24/20: EF 60-65% with mild LAE and small pericardial effusion - Echo 05/18/21: EF of 60-65% along with  mild MR and normal PA Pressure of 26.3 mmHg.  - continue jardiance  10mg  daily - continue furosemide  20mg  daily - continue potassium 40meq BID - elevate legs when sitting for long periods of time - not adding salt and she tries to review food labels for sodium content although has eaten out the last 2 days - BNP 12/02/21 was 75.6   2: DM with CKD- - A1c 06/28/22 was 6.9%  3: Hyperlipidemia- - continue atorvastatin  20mg  daily - LDL 02/05/21 was 69  4: Paroxsymal SVT- - saw cardiology Jenna [name]) 03/25 - continue diltiazem  240mg  daily - continue apixaban  5mg  BID - continue metoprolol  tartrate 25mg  BID PRN  5: HTN- - BP  - had video visit with PCP Jenna [name]) 08/24 - BMP 08/05/22 reviewed: sodium 138, potassium 3.4, creatinine 1.0 & GFR [address]      Jenna [name], [place] 06/10/23

## 2023-06-10 NOTE — Telephone Encounter (Signed)
 Called to confirm/remind patient of their appointment at the Advanced [facility] Failure Clinic on 06/11/23.   Appointment:   [] Confirmed  [] Left mess   [] No answer/No voice mail  [x] VM Full/unable to leave message  [] Phone not in service  Patient reminded to bring all medications and/or complete list.  Confirmed patient has transportation. Gave directions, instructed to utilize valet parking.

## 2023-06-11 ENCOUNTER — Encounter: Payer: Medicare Other | Admitting: Family

## 2023-06-11 ENCOUNTER — Telehealth: Payer: Self-pay | Admitting: Family

## 2023-06-11 NOTE — Telephone Encounter (Signed)
 Patient did not show for her Heart Failure Clinic appointment on 06/11/23.

## 2023-06-13 ENCOUNTER — Other Ambulatory Visit: Payer: Self-pay

## 2023-06-13 DIAGNOSIS — M150 Primary generalized (osteo)arthritis: Secondary | ICD-10-CM

## 2023-06-13 DIAGNOSIS — G8929 Other chronic pain: Secondary | ICD-10-CM

## 2023-06-13 MED ORDER — GABAPENTIN 300 MG PO CAPS
300.0000 mg | ORAL_CAPSULE | Freq: Every day | ORAL | 0 refills | Status: DC
Start: 2023-06-13 — End: 2023-06-24

## 2023-06-24 ENCOUNTER — Encounter: Payer: Self-pay | Admitting: Family Medicine

## 2023-06-24 ENCOUNTER — Ambulatory Visit (INDEPENDENT_AMBULATORY_CARE_PROVIDER_SITE_OTHER): Admitting: Family Medicine

## 2023-06-24 VITALS — BP 122/78 | HR 74 | Ht 61.0 in | Wt 166.2 lb

## 2023-06-24 DIAGNOSIS — M150 Primary generalized (osteo)arthritis: Secondary | ICD-10-CM | POA: Diagnosis not present

## 2023-06-24 DIAGNOSIS — F5104 Psychophysiologic insomnia: Secondary | ICD-10-CM

## 2023-06-24 DIAGNOSIS — E1121 Type 2 diabetes mellitus with diabetic nephropathy: Secondary | ICD-10-CM

## 2023-06-24 DIAGNOSIS — J432 Centrilobular emphysema: Secondary | ICD-10-CM | POA: Diagnosis not present

## 2023-06-24 DIAGNOSIS — M545 Low back pain, unspecified: Secondary | ICD-10-CM

## 2023-06-24 DIAGNOSIS — R42 Dizziness and giddiness: Secondary | ICD-10-CM

## 2023-06-24 DIAGNOSIS — K219 Gastro-esophageal reflux disease without esophagitis: Secondary | ICD-10-CM | POA: Diagnosis not present

## 2023-06-24 DIAGNOSIS — G8929 Other chronic pain: Secondary | ICD-10-CM

## 2023-06-24 DIAGNOSIS — F331 Major depressive disorder, recurrent, moderate: Secondary | ICD-10-CM

## 2023-06-24 DIAGNOSIS — Z7984 Long term (current) use of oral hypoglycemic drugs: Secondary | ICD-10-CM

## 2023-06-24 MED ORDER — SERTRALINE HCL 100 MG PO TABS
100.0000 mg | ORAL_TABLET | Freq: Every day | ORAL | 3 refills | Status: AC
Start: 2023-06-24 — End: ?

## 2023-06-24 MED ORDER — GABAPENTIN 300 MG PO CAPS
300.0000 mg | ORAL_CAPSULE | Freq: Every day | ORAL | 3 refills | Status: AC
Start: 2023-06-24 — End: ?

## 2023-06-24 MED ORDER — PANTOPRAZOLE SODIUM 40 MG PO TBEC: 40.0000 mg | DELAYED_RELEASE_TABLET | Freq: Every day | ORAL | 3 refills | Status: AC

## 2023-06-24 NOTE — Patient Instructions (Addendum)
 Thank you for coming to the office today.  Refilled medications today Let me know if missing anything  Stop Famotidine  Pepcid   Keep on Pantrazole  1. You have symptoms of Vertigo (Benign Paroxysmal Positional Vertigo) - This is commonly caused by inner ear fluid imbalance, sometimes can be worsened by allergies and sinus symptoms, otherwise it can occur randomly sometimes and we may never discover the exact cause. - To treat this, try the Epley Manuever (see diagrams/instructions below) at home up to 3 times a day for 1-2 weeks or until symptoms resolve  If you develop significant worsening episode with vertigo that does not improve and you get severe headache, loss of vision, arm or leg weakness, slurred speech, or other concerning symptoms please seek immediate medical attention at Emergency Department.  See the next page for images describing the Epley Manuever.     ----------------------------------------------------------------------------------------------------------------------         Please schedule a Follow-up Appointment to: Return if symptoms worsen or fail to improve.  If you have any other questions or concerns, please feel free to call the office or send a message through MyChart. You may also schedule an earlier appointment if necessary.  Additionally, you may be receiving a survey about your experience at our office within a few days to 1 week by e-mail or mail. We value your feedback.  Domingo Friend, DO [facility], [place]

## 2023-06-24 NOTE — Progress Notes (Signed)
 Subjective:    Patient ID: Jenna [name], female    DOB: 1935-03-14, 88 y.o.   MRN: [id number]  Jenna [name] is a 88 y.o. female presenting on 06/24/2023 for Dizziness   HPI  Discussed the use of AI scribe software for clinical note transcription with the patient, who gave verbal consent to proceed.  History of Present Illness   Jenna [name] is an 88 year old female who presents with dizziness and vertigo.  She experiences significant dizziness, described as vertigo, particularly when lying in bed. There is a sensation of the room spinning, requiring her to sit up and wait for it to pass before moving. These episodes have been occurring more frequently over the past month and last only a couple of minutes. No lightheadedness upon standing or changing positions.  Her blood pressure readings, monitored by a pharmacy, show an average of 113/58 with a range from 90/46 to 140/78. Dizziness began recently, despite previously not experiencing it when her blood pressure was monitored. She takes diltiazem  240 mg daily, Jardiance , and metoprolol  as needed for a racing heart, which occurs about once or twice a month.  Last saw Cardiology 03/2023 has done well on current meds. But they were cautious that she may need to reduce dose Diltiazem  if not tolerated in future.  Her daughter notes that she takes a fluid pill daily due to foot swelling, which has shown improvement. She also takes gabapentin  300 mg at night, methocarbamol  as needed for neck spasms, pantoprazole  for stomach acid, and sertraline  for mood.  In terms of her social history, she has been more independent and mobile compared to when she had a broken arm last year, which required assistance for daily activities.   Husband passed 08/2022.         06/24/2023    3:51 PM 01/07/2023   10:54 AM 11/19/2022   11:59 AM  Depression screen PHQ 2/9  Decreased Interest 1 1 1   Down, Depressed, Hopeless 1 1 1   PHQ - 2 Score 2 2 2    Altered sleeping 2  0  Tired, decreased energy 3  1  Change in appetite 0  0  Feeling bad or failure about yourself  0  0  Trouble concentrating 0  0  Moving slowly or fidgety/restless 0  0  Suicidal thoughts 0  0  PHQ-9 Score 7  3  Difficult doing work/chores Not difficult at all  Not difficult at all       06/24/2023    3:51 PM 09/16/2022   11:25 AM 06/28/2022    2:22 PM 03/07/2021   11:19 AM  GAD 7 : Generalized Anxiety Score  Nervous, Anxious, on Edge 0 0 2 0  Control/stop worrying 0 0 2 0  Worry too much - different things 0 0 3 0  Trouble relaxing 0 0 2 0  Restless 0 0 1 1  Easily annoyed or irritable 1 2 2 1   Afraid - awful might happen 0 0 0 0  Total GAD 7 Score 1 2 12 2   Anxiety Difficulty Not difficult at all Somewhat difficult Somewhat difficult Not difficult at all    Social History   Tobacco Use   Smoking status: Former    Current packs/day: 0.00    Average packs/day: 1 pack/day for 20.0 years (20.0 ttl pk-yrs)    Types: Cigarettes    Start date: 07/1961    Quit date: 07/1981    Years since quitting: 41.9   Smokeless  tobacco: Former  Building services engineer status: Never Used  Substance Use Topics   Alcohol use: Not Currently    Comment: past   Drug use: No    Review of Systems Per HPI unless specifically indicated above     Objective:     BP 122/78 (BP Location: Right Wrist, Patient Position: Sitting, Cuff Size: Normal)   Pulse 74   Ht 5\' 1"  (1.549 m)   Wt 166 lb 4 oz (75.4 kg)   SpO2 97%   BMI 31.41 kg/m   Wt Readings from Last 3 Encounters:  06/24/23 166 lb 4 oz (75.4 kg)  03/25/23 168 lb 4 oz (76.3 kg)  10/02/22 179 lb (81.2 kg)    Physical Exam Vitals and nursing note reviewed.  Constitutional:      General: She is not in acute distress.    Appearance: She is well-developed. She is not diaphoretic.     Comments: Well-appearing, comfortable, cooperative  HENT:     Head: Normocephalic and atraumatic.  Eyes:     General:        Right  eye: No discharge.        Left eye: No discharge.     Conjunctiva/sclera: Conjunctivae normal.  Neck:     Thyroid : No thyromegaly.  Cardiovascular:     Rate and Rhythm: Normal rate and regular rhythm.     Heart sounds: Normal heart sounds. No murmur heard. Pulmonary:     Effort: Pulmonary effort is normal. No respiratory distress.     Breath sounds: Normal breath sounds. No wheezing or rales.  Musculoskeletal:        General: Normal range of motion.     Cervical back: Normal range of motion and neck supple.  Lymphadenopathy:     Cervical: No cervical adenopathy.  Skin:    General: Skin is warm and dry.     Findings: No erythema or rash.  Neurological:     Mental Status: She is alert and oriented to person, place, and time.  Psychiatric:        Behavior: Behavior normal.     Comments: Well groomed, good eye contact, normal speech and thoughts     Results for orders placed or performed in visit on 06/28/22  POCT glycosylated hemoglobin (Hb A1C)   Collection Time: 06/28/22  2:49 PM  Result Value Ref Range   Hemoglobin A1C 6.9 (A) 4.0 - 5.6 %   HbA1c POC (<> result, manual entry)     HbA1c, POC (prediabetic range)     HbA1c, POC (controlled diabetic range)        Assessment & Plan:   Problem List Items Addressed This Visit     Depression, major, recurrent (HCC)   Relevant Medications   sertraline  (ZOLOFT ) 100 MG tablet   GERD (gastroesophageal reflux disease)   Relevant Medications   pantoprazole  (PROTONIX ) 40 MG tablet   Primary osteoarthritis involving multiple joints   Relevant Medications   gabapentin  (NEURONTIN ) 300 MG capsule   Psychophysiological insomnia   Relevant Medications   sertraline  (ZOLOFT ) 100 MG tablet   Other Visit Diagnoses       Vertigo    -  Primary     Chronic bilateral low back pain without sciatica       Relevant Medications   gabapentin  (NEURONTIN ) 300 MG capsule   sertraline  (ZOLOFT ) 100 MG tablet     Controlled type 2 diabetes  mellitus with diabetic nephropathy, without long-term current use of insulin  (  HCC)         Centrilobular emphysema (HCC)         Long term current use of oral hypoglycemic drug          Vertigo Likely benign paroxysmal positional vertigo (BPPV). - Provided instructions for home Epley maneuver exercises. - Encouraged exercises a few times daily until symptoms resolve. - Consider referral to vestibular physical therapy if home exercises are ineffective.  Paroxysmal Atrial Fibrillation Followed by Cardiology - Continue diltiazem  240 mg daily. We discussed concern that with her weight loss she may have too low BP see below  Hypertension Blood pressure well-controlled at 113/58 mmHg. Diltiazem  effective for hypertension and atrial fibrillation. Detailed outside Bp readings done at [facility], scanned into chart reviewed since Feb 2025 Does not seem to have orthostatic or postural symptoms - Continue current antihypertensive regimen. - Monitor blood pressure regularly. - Communicate with Dr. Gollin regarding potential dose adjustment of diltiazem  if necessary. May need lower dose to avoid lower BP readings now with weight loss  Gastroesophageal Reflux Disease (GERD) Managed with pantoprazole  40 mg daily. No recent indigestion symptoms. - Continue pantoprazole  40 mg daily. - Discontinue famotidine .  General Health Maintenance Refill gabapentin , sertraline , and pantoprazole  for one year.  Type 2 Diabetes Not focused on today - Perform finger prick blood glucose test at follow-up. - Monitor vertigo symptoms and contact provider if symptoms worsen.        No orders of the defined types were placed in this encounter.   Meds ordered this encounter  Medications   gabapentin  (NEURONTIN ) 300 MG capsule    Sig: Take 1 capsule (300 mg total) by mouth at bedtime.    Dispense:  90 capsule    Refill:  3   pantoprazole  (PROTONIX ) 40 MG tablet    Sig: Take 1 tablet (40 mg total)  by mouth daily.    Dispense:  90 tablet    Refill:  3   sertraline  (ZOLOFT ) 100 MG tablet    Sig: Take 1 tablet (100 mg total) by mouth daily.    Dispense:  90 tablet    Refill:  3    Add refills    Follow up plan: Return if symptoms worsen or fail to improve.   Domingo Friend, DO [facility] 06/24/2023, 3:47 PM

## 2023-06-25 DIAGNOSIS — J432 Centrilobular emphysema: Secondary | ICD-10-CM | POA: Diagnosis not present

## 2023-06-27 DIAGNOSIS — I10 Essential (primary) hypertension: Secondary | ICD-10-CM | POA: Diagnosis not present

## 2023-07-01 ENCOUNTER — Encounter: Payer: Self-pay | Admitting: Family

## 2023-07-27 DIAGNOSIS — I10 Essential (primary) hypertension: Secondary | ICD-10-CM | POA: Diagnosis not present

## 2023-08-27 DIAGNOSIS — I10 Essential (primary) hypertension: Secondary | ICD-10-CM | POA: Diagnosis not present

## 2023-09-10 ENCOUNTER — Other Ambulatory Visit: Payer: Self-pay | Admitting: Nurse Practitioner

## 2023-09-10 NOTE — Telephone Encounter (Signed)
 Prescription refill request for Eliquis  received. Indication: PAF Last office visit: 03/25/23  ONEIDA Lunger MD Scr: 1.0 on 08/05/22  Epic Age: 88 Weight: 76.3kg  Based on above findings Eliquis  5mg  twice daily is the appropriate dose.  Refill approved.

## 2023-09-24 ENCOUNTER — Ambulatory Visit: Admitting: Family Medicine

## 2023-09-24 ENCOUNTER — Encounter: Payer: Self-pay | Admitting: Family Medicine

## 2023-09-24 VITALS — BP 110/72 | HR 74 | Ht 61.0 in | Wt 160.1 lb

## 2023-09-24 DIAGNOSIS — E876 Hypokalemia: Secondary | ICD-10-CM | POA: Diagnosis not present

## 2023-09-24 DIAGNOSIS — E1121 Type 2 diabetes mellitus with diabetic nephropathy: Secondary | ICD-10-CM | POA: Diagnosis not present

## 2023-09-24 DIAGNOSIS — R600 Localized edema: Secondary | ICD-10-CM | POA: Diagnosis not present

## 2023-09-24 DIAGNOSIS — F331 Major depressive disorder, recurrent, moderate: Secondary | ICD-10-CM

## 2023-09-24 DIAGNOSIS — M62830 Muscle spasm of back: Secondary | ICD-10-CM

## 2023-09-24 DIAGNOSIS — M150 Primary generalized (osteo)arthritis: Secondary | ICD-10-CM

## 2023-09-24 DIAGNOSIS — Z7984 Long term (current) use of oral hypoglycemic drugs: Secondary | ICD-10-CM | POA: Diagnosis not present

## 2023-09-24 DIAGNOSIS — M85852 Other specified disorders of bone density and structure, left thigh: Secondary | ICD-10-CM | POA: Diagnosis not present

## 2023-09-24 DIAGNOSIS — Z23 Encounter for immunization: Secondary | ICD-10-CM

## 2023-09-24 DIAGNOSIS — F5104 Psychophysiologic insomnia: Secondary | ICD-10-CM

## 2023-09-24 LAB — POCT GLYCOSYLATED HEMOGLOBIN (HGB A1C): Hemoglobin A1C: 5.5 % (ref 4.0–5.6)

## 2023-09-24 MED ORDER — EMPAGLIFLOZIN 10 MG PO TABS
10.0000 mg | ORAL_TABLET | Freq: Every day | ORAL | 1 refills | Status: AC
Start: 2023-09-24 — End: ?

## 2023-09-24 MED ORDER — METHOCARBAMOL 500 MG PO TABS
500.0000 mg | ORAL_TABLET | Freq: Three times a day (TID) | ORAL | 3 refills | Status: AC | PRN
Start: 2023-09-24 — End: ?

## 2023-09-24 MED ORDER — FUROSEMIDE 20 MG PO TABS
20.0000 mg | ORAL_TABLET | Freq: Every day | ORAL | 3 refills | Status: AC | PRN
Start: 2023-09-24 — End: ?

## 2023-09-24 MED ORDER — POTASSIUM CHLORIDE CRYS ER 20 MEQ PO TBCR: 40.0000 meq | EXTENDED_RELEASE_TABLET | Freq: Two times a day (BID) | ORAL | 1 refills | Status: DC

## 2023-09-24 MED ORDER — TRAZODONE HCL 100 MG PO TABS
100.0000 mg | ORAL_TABLET | Freq: Every day | ORAL | 1 refills | Status: AC
Start: 2023-09-24 — End: ?

## 2023-09-24 NOTE — Progress Notes (Signed)
 Subjective:    Patient ID: Jenna [name], female    DOB: [birth year], 88 y.o.   MRN: [id number]  Jenna [name] is a 88 y.o. female presenting on 09/24/2023 for Medical Management of Chronic Issues   HPI  Discussed the use of AI scribe software for clinical note transcription with the patient, who gave verbal consent to proceed.  History of Present Illness   Jenna [name] is an 88 year old female with diabetes and osteopenia who presents for a three-month follow-up visit. She is accompanied by her daughter.  Type 2 Diabetes Glycemic control - Diabetes managed with Jardiance  10mg , one pill daily - Hemoglobin A1C improved from 6.9 to 5.5 - Home blood glucose readings generally between 100 to 140 mg/dL - She is doing well with diet and weight has been stable - No changes in feet, including no skin sores, wounds, or loss of sensation  Osteopenia - Osteopenia managed with Fosamax  for over five years - Fosamax  taken once weekly, typically on Friday or Saturday - Last bone density DEXA scan performed several years ago Order new DEXA and decide on Fosamax   Chronic L Shoulder pain Musculoskeletal pain and prior fracture - Occasional twinges in left shoulder, previously fractured - Pain provoked by certain activities - Methocarbamol  used occasionally for neck soreness; has supply at home but may need renewal  Edema / Lower Extremity Fluid management and electrolyte supplementation - Furosemide  taken daily for fluid management - Potassium supplementation taken twice daily  Sleep disturbance - Trazodone  taken nightly for sleep  Immunization status - Received influenza vaccination      Current medication - Sertraline  100mg  daily, and Trazodone  100mg  nightly Still has issues with sleep and insomnia - Admits difficulty with sleeping, can stay up 1-2 nights all night      06/24/2023    3:51 PM 01/07/2023   10:54 AM 11/19/2022   11:59 AM  Depression screen PHQ 2/9   Decreased Interest 1 1 1   Down, Depressed, Hopeless 1 1 1   PHQ - 2 Score 2 2 2   Altered sleeping 2  0  Tired, decreased energy 3  1  Change in appetite 0  0  Feeling bad or failure about yourself  0  0  Trouble concentrating 0  0  Moving slowly or fidgety/restless 0  0  Suicidal thoughts 0  0  PHQ-9 Score 7  3  Difficult doing work/chores Not difficult at all  Not difficult at all       06/24/2023    3:51 PM 09/16/2022   11:25 AM 06/28/2022    2:22 PM 03/07/2021   11:19 AM  GAD 7 : Generalized Anxiety Score  Nervous, Anxious, on Edge 0 0 2 0  Control/stop worrying 0 0 2 0  Worry too much - different things 0 0 3 0  Trouble relaxing 0 0 2 0  Restless 0 0 1 1  Easily annoyed or irritable 1 2 2 1   Afraid - awful might happen 0 0 0 0  Total GAD 7 Score 1 2 12 2   Anxiety Difficulty Not difficult at all Somewhat difficult Somewhat difficult Not difficult at all    Social History   Tobacco Use   Smoking status: Former    Current packs/day: 0.00    Average packs/day: 1 pack/day for 20.0 years (20.0 ttl pk-yrs)    Types: Cigarettes    Start date: 07/1961    Quit date: 07/1981    Years since quitting: 32.2  Smokeless tobacco: Former  Building services engineer status: Never Used  Substance Use Topics   Alcohol use: Not Currently    Comment: past   Drug use: No    Review of Systems Per HPI unless specifically indicated above     Objective:    BP 110/72 (BP Location: Right Wrist, Patient Position: Sitting, Cuff Size: Normal)   Pulse 74   Ht 5' 1 (1.549 m)   Wt 160 lb 2 oz (72.6 kg)   SpO2 96%   BMI 30.26 kg/m   Wt Readings from Last 3 Encounters:  09/24/23 160 lb 2 oz (72.6 kg)  06/24/23 166 lb 4 oz (75.4 kg)  03/25/23 168 lb 4 oz (76.3 kg)    Physical Exam Vitals and nursing note reviewed.  Constitutional:      General: She is not in acute distress.    Appearance: Normal appearance. She is well-developed. She is not diaphoretic.     Comments: Well-appearing,  comfortable, cooperative  HENT:     Head: Normocephalic and atraumatic.  Eyes:     General:        Right eye: No discharge.        Left eye: No discharge.     Conjunctiva/sclera: Conjunctivae normal.  Cardiovascular:     Rate and Rhythm: Normal rate.  Pulmonary:     Effort: Pulmonary effort is normal.  Musculoskeletal:     Right lower leg: Edema present.     Left lower leg: Edema present.  Skin:    General: Skin is warm and dry.     Findings: No erythema or rash.  Neurological:     Mental Status: She is alert and oriented to person, place, and time.  Psychiatric:        Mood and Affect: Mood normal.        Behavior: Behavior normal.        Thought Content: Thought content normal.     Comments: Well groomed, good eye contact, normal speech and thoughts     Diabetic Foot Exam - Simple   Simple Foot Form Diabetic Foot exam was performed with the following findings: Yes 09/24/2023  2:39 PM  Visual Inspection See comments: Yes Sensation Testing Intact to touch and monofilament testing bilaterally: Yes Pulse Check Posterior Tibialis and Dorsalis pulse intact bilaterally: Yes Comments Bilateral callus formation  heels and forefoot, no ulceration      Results for orders placed or performed in visit on 09/24/23  POCT HgB A1C   Collection Time: 09/24/23  2:22 PM  Result Value Ref Range   Hemoglobin A1C 5.5 4.0 - 5.6 %   HbA1c POC (<> result, manual entry)     HbA1c, POC (prediabetic range)     HbA1c, POC (controlled diabetic range)        Assessment & Plan:   Problem List Items Addressed This Visit     Bilateral leg edema   Relevant Medications   furosemide  (LASIX ) 20 MG tablet   Depression, major, recurrent (HCC)   Relevant Medications   traZODone  (DESYREL ) 100 MG tablet   Primary osteoarthritis involving multiple joints   Relevant Medications   methocarbamol  (ROBAXIN ) 500 MG tablet   Psychophysiological insomnia   Relevant Medications   traZODone  (DESYREL )  100 MG tablet   Other Visit Diagnoses       Controlled type 2 diabetes mellitus with diabetic nephropathy, without long-term current use of insulin  (HCC)    -  Primary   Relevant Medications  empagliflozin  (JARDIANCE ) 10 MG TABS tablet   Other Relevant Orders   POCT HgB A1C (Completed)     Flu vaccine need       Relevant Orders   Flu vaccine HIGH DOSE PF(Fluzone Trivalent) (Completed)     Osteopenia of left hip       Relevant Orders   DG Bone Density     Muscle spasm of back       Relevant Medications   methocarbamol  (ROBAXIN ) 500 MG tablet     Hypokalemia       Relevant Medications   potassium chloride  SA (KLOR-CON  M) 20 MEQ tablet     Need for Streptococcus pneumoniae vaccination       Relevant Orders   Pneumococcal conjugate vaccine 20-valent (Completed)        Type 2 diabetes mellitus, well controlled Diabetes well controlled with Jardiance . Hemoglobin A1c improved to 5.5%. Glucose levels stable within target range. - Continue Jardiance  once daily for diabetes management.  Osteopenia, on long-term bisphosphonate therapy, pending repeat DEXA scan Osteopenia managed with Fosamax . Repeat DEXA scan needed to evaluate bone density and therapy continuation. - Order DEXA scan to assess bone density. - Defer Fosamax  refill until DEXA scan results are available. - Continue current Fosamax  supply until results are reviewed.  Edema, well-managed on diuretic therapy Edema well-managed with furosemide . - Renew furosemide  prescription to prevent lapse in medication.  Chronic left shoulder pain after fracture Chronic shoulder pain persists post-fracture. Managed with activity modification and muscle relaxants as needed. - Renew methocarbamol  prescription for use as needed for shoulder pain.        Orders Placed This Encounter  Procedures   DG Bone Density    Standing Status:   Future    Expiration Date:   09/23/2024    Reason for Exam (SYMPTOM  OR DIAGNOSIS REQUIRED):    osteopenia repeat DEXA last 2019, on fosamax     Preferred imaging location?:   [facility]   Flu vaccine HIGH DOSE PF(Fluzone Trivalent)   Pneumococcal conjugate vaccine 20-valent   POCT HgB A1C    Meds ordered this encounter  Medications   empagliflozin  (JARDIANCE ) 10 MG TABS tablet    Sig: Take 1 tablet (10 mg total) by mouth daily before breakfast.    Dispense:  90 tablet    Refill:  1   furosemide  (LASIX ) 20 MG tablet    Sig: Take 1 tablet (20 mg total) by mouth daily as needed for fluid or edema. Take lasix  daily, with extra dose as needed for swelling    Dispense:  90 tablet    Refill:  3   methocarbamol  (ROBAXIN ) 500 MG tablet    Sig: Take 1 tablet (500 mg total) by mouth every 8 (eight) hours as needed for muscle spasms.    Dispense:  90 tablet    Refill:  3   potassium chloride  SA (KLOR-CON  M) 20 MEQ tablet    Sig: Take 2 tablets (40 mEq total) by mouth 2 (two) times daily.    Dispense:  180 tablet    Refill:  1   traZODone  (DESYREL ) 100 MG tablet    Sig: Take 1 tablet (100 mg total) by mouth at bedtime.    Dispense:  90 tablet    Refill:  1    Follow up plan: Return in about 3 months (around 12/24/2023) for 3 month DM A1c, Osteopenia (DEXA results).    Marsa Officer, DO [facility] 09/24/2023,  2:28 PM

## 2023-09-24 NOTE — Patient Instructions (Addendum)
 Thank you for coming to the office today.  Recent Labs    09/24/23 1422  HGBA1C 5.5   Keep on Jardiance  for blood sugar  Refilled medications  Furosemide  lasix  daily fluid pill  [facility] [address] [place], [place] [address] Phone: [phone] [email or web address]  DEXA Bone density scan DEXA Scan (Bone mineral density) screening for osteoporosis  Call the Imaging Center below anytime to schedule your own appointment now that order has been placed.  [facility] [address] [place], [place] [address] Phone: [phone]  Keep on Fosamax  for bone density for now, we will make a final decision before the rx runs out.   Please schedule a Follow-up Appointment to: Return in about 3 months (around 12/24/2023) for 3 month DM A1c, Osteopenia (DEXA results).  If you have any other questions or concerns, please feel free to call the office or send a message through MyChart. You may also schedule an earlier appointment if necessary.  Additionally, you may be receiving a survey about your experience at our office within a few days to 1 week by e-mail or mail. We value your feedback.  Marsa Officer, DO [facility], [place]

## 2023-09-26 DIAGNOSIS — I10 Essential (primary) hypertension: Secondary | ICD-10-CM | POA: Diagnosis not present

## 2023-10-06 ENCOUNTER — Other Ambulatory Visit: Payer: Self-pay

## 2023-10-06 DIAGNOSIS — M85852 Other specified disorders of bone density and structure, left thigh: Secondary | ICD-10-CM

## 2023-10-06 MED ORDER — ALENDRONATE SODIUM 70 MG PO TABS: 70.0000 mg | ORAL_TABLET | ORAL | 3 refills | Status: AC

## 2023-10-26 DIAGNOSIS — I10 Essential (primary) hypertension: Secondary | ICD-10-CM | POA: Diagnosis not present

## 2023-10-29 ENCOUNTER — Other Ambulatory Visit: Payer: Self-pay | Admitting: Pharmacist

## 2023-10-29 DIAGNOSIS — E1121 Type 2 diabetes mellitus with diabetic nephropathy: Secondary | ICD-10-CM

## 2023-10-29 NOTE — Progress Notes (Signed)
   10/29/2023  Patient ID: Jenna [name], female   DOB: Jun 10, 1935, 88 y.o.   MRN: [id number]  This patient is appearing on a report for being at risk of failing the adherence measure for cholesterol (statin) medications this calendar year.   Medication: atorvastatin  20 mg  Last fill date: 10/17/2023 for 90 day supply  Insurance report was not up to date. No action needed at this time.   Sharyle Sia, PharmD, [facility] [phone]

## 2023-11-12 DIAGNOSIS — R5381 Other malaise: Secondary | ICD-10-CM | POA: Diagnosis not present

## 2023-11-12 DIAGNOSIS — F5101 Primary insomnia: Secondary | ICD-10-CM | POA: Diagnosis not present

## 2023-12-03 NOTE — Progress Notes (Signed)
 Jenna [name]                                          MRN: [id number]   12/03/2023   The [facility] Quality Team Specialist reviewed this patient medical record for the purposes of chart review for care gap closure. The following were reviewed: abstraction for care gap closure-glycemic status assessment.    [facility] Quality Team

## 2023-12-29 ENCOUNTER — Ambulatory Visit: Admitting: Family Medicine

## 2023-12-30 ENCOUNTER — Other Ambulatory Visit: Payer: Self-pay

## 2023-12-30 DIAGNOSIS — E876 Hypokalemia: Secondary | ICD-10-CM

## 2023-12-30 MED ORDER — POTASSIUM CHLORIDE CRYS ER 20 MEQ PO TBCR: 40.0000 meq | EXTENDED_RELEASE_TABLET | Freq: Two times a day (BID) | ORAL | 1 refills | Status: AC

## 2024-01-05 ENCOUNTER — Other Ambulatory Visit: Payer: Self-pay

## 2024-01-05 DIAGNOSIS — E1169 Type 2 diabetes mellitus with other specified complication: Secondary | ICD-10-CM

## 2024-01-05 MED ORDER — ATORVASTATIN CALCIUM 20 MG PO TABS: 20.0000 mg | ORAL_TABLET | Freq: Every day | ORAL | 3 refills | Status: AC

## 2024-02-03 ENCOUNTER — Encounter: Payer: Self-pay | Admitting: Family Medicine

## 2024-02-03 ENCOUNTER — Ambulatory Visit: Admitting: Family Medicine

## 2024-02-03 VITALS — BP 132/80 | HR 59 | Ht 61.0 in | Wt 161.2 lb

## 2024-02-03 DIAGNOSIS — R600 Localized edema: Secondary | ICD-10-CM | POA: Diagnosis not present

## 2024-02-03 DIAGNOSIS — I471 Supraventricular tachycardia, unspecified: Secondary | ICD-10-CM

## 2024-02-03 DIAGNOSIS — I480 Paroxysmal atrial fibrillation: Secondary | ICD-10-CM | POA: Diagnosis not present

## 2024-02-03 NOTE — Progress Notes (Unsigned)
 "  Subjective:    Patient ID: Jenna [name], female    DOB: [birth year], 89 y.o.   MRN: [id number]  Lorece Keach is a 89 y.o. female presenting on 02/03/2024 for Medical Management of Chronic Issues (Concerns about traveling )   HPI  Traveling to [place]  ***  ***Blood clot risk *** On Eliquis  5mg  TWICE A DAY for Atrial ***   ***night sweats new  Health Maintenance: Flu updated 09/2023     06/24/2023    3:51 PM 01/07/2023   10:54 AM 11/19/2022   11:59 AM  Depression screen PHQ 2/9  Decreased Interest 1 1 1   Down, Depressed, Hopeless 1 1 1   PHQ - 2 Score 2 2 2   Altered sleeping 2  0  Tired, decreased energy 3  1  Change in appetite 0  0  Feeling bad or failure about yourself  0  0  Trouble concentrating 0  0  Moving slowly or fidgety/restless 0  0  Suicidal thoughts 0  0  PHQ-9 Score 7   3   Difficult doing work/chores Not difficult at all  Not difficult at all     Data saved with a previous flowsheet row definition       06/24/2023    3:51 PM 09/16/2022   11:25 AM 06/28/2022    2:22 PM 03/07/2021   11:19 AM  GAD 7 : Generalized Anxiety Score  Nervous, Anxious, on Edge 0 0 2 0  Control/stop worrying 0 0 2 0  Worry too much - different things 0 0 3 0  Trouble relaxing 0 0 2 0  Restless 0 0 1 1  Easily annoyed or irritable 1 2 2 1   Afraid - awful might happen 0 0 0 0  Total GAD 7 Score 1 2 12 2   Anxiety Difficulty Not difficult at all Somewhat difficult Somewhat difficult Not difficult at all    Social History[1]  Review of Systems Per HPI unless specifically indicated above     Objective:    BP 132/80 (BP Location: Left Arm, Patient Position: Sitting, Cuff Size: Normal)   Pulse (!) 59   Ht 5' 1 (1.549 m)   Wt 161 lb 4 oz (73.1 kg)   BMI 30.47 kg/m   Wt Readings from Last 3 Encounters:  02/03/24 161 lb 4 oz (73.1 kg)  09/24/23 160 lb 2 oz (72.6 kg)  06/24/23 166 lb 4 oz (75.4 kg)    Physical Exam  Results for orders placed or performed in visit  on 09/24/23  POCT HgB A1C   Collection Time: 09/24/23  2:22 PM  Result Value Ref Range   Hemoglobin A1C 5.5 4.0 - 5.6 %   HbA1c POC (<> result, manual entry)     HbA1c, POC (prediabetic range)     HbA1c, POC (controlled diabetic range)        Assessment & Plan:   Problem List Items Addressed This Visit   None    ***  No orders of the defined types were placed in this encounter.   No orders of the defined types were placed in this encounter.   Follow up plan: No follow-ups on file.  Future labs ordered for ***   Marsa Officer, DO [facility] 02/03/2024, 1:21 PM    [1]  Social History Tobacco Use   Smoking status: Former    Current packs/day: 0.00    Average packs/day: 1 pack/day for 20.0 years (20.0 ttl  pk-yrs)    Types: Cigarettes    Start date: 07/1961    Quit date: 07/1981    Years since quitting: 42.5   Smokeless tobacco: Former  Building Services Engineer status: Never Used  Substance Use Topics   Alcohol use: Not Currently    Comment: past   Drug use: No   "

## 2024-02-03 NOTE — Patient Instructions (Addendum)
 Thank you for coming to the office today.     Please schedule a Follow-up Appointment to: Return for 3 month fasting lab > 1 week later Annual Physical.  If you have any other questions or concerns, please feel free to call the office or send a message through MyChart. You may also schedule an earlier appointment if necessary.  Additionally, you may be receiving a survey about your experience at our office within a few days to 1 week by e-mail or mail. We value your feedback.  Marsa Officer, DO [facility], [place]

## 2024-02-04 ENCOUNTER — Other Ambulatory Visit: Payer: Self-pay | Admitting: Family Medicine

## 2024-02-04 DIAGNOSIS — E538 Deficiency of other specified B group vitamins: Secondary | ICD-10-CM

## 2024-02-04 DIAGNOSIS — J432 Centrilobular emphysema: Secondary | ICD-10-CM

## 2024-02-04 DIAGNOSIS — E559 Vitamin D deficiency, unspecified: Secondary | ICD-10-CM

## 2024-02-04 DIAGNOSIS — Z0000 Encounter for general adult medical examination without abnormal findings: Secondary | ICD-10-CM

## 2024-02-04 DIAGNOSIS — I480 Paroxysmal atrial fibrillation: Secondary | ICD-10-CM

## 2024-02-04 DIAGNOSIS — E1169 Type 2 diabetes mellitus with other specified complication: Secondary | ICD-10-CM

## 2024-02-04 DIAGNOSIS — E1121 Type 2 diabetes mellitus with diabetic nephropathy: Secondary | ICD-10-CM

## 2024-04-30 ENCOUNTER — Other Ambulatory Visit

## 2024-05-07 ENCOUNTER — Encounter: Admitting: Family Medicine
